# Patient Record
Sex: Male | Born: 1970 | Race: Black or African American | Hispanic: No | Marital: Married | Smoking: Former smoker
Health system: Southern US, Community
[De-identification: ages and names within clinical notes are randomized; demographics above are authoritative.]

## PROBLEM LIST (undated history)

## (undated) DIAGNOSIS — I509 Heart failure, unspecified: Secondary | ICD-10-CM

## (undated) DIAGNOSIS — S335XXA Sprain of ligaments of lumbar spine, initial encounter: Secondary | ICD-10-CM

## (undated) DIAGNOSIS — K219 Gastro-esophageal reflux disease without esophagitis: Secondary | ICD-10-CM

## (undated) DIAGNOSIS — N179 Acute kidney failure, unspecified: Secondary | ICD-10-CM

## (undated) DIAGNOSIS — R5381 Other malaise: Secondary | ICD-10-CM

## (undated) DIAGNOSIS — K047 Periapical abscess without sinus: Secondary | ICD-10-CM

## (undated) DIAGNOSIS — E109 Type 1 diabetes mellitus without complications: Secondary | ICD-10-CM

## (undated) DIAGNOSIS — E119 Type 2 diabetes mellitus without complications: Secondary | ICD-10-CM

## (undated) DIAGNOSIS — R21 Rash and other nonspecific skin eruption: Secondary | ICD-10-CM

## (undated) DIAGNOSIS — E785 Hyperlipidemia, unspecified: Secondary | ICD-10-CM

## (undated) DIAGNOSIS — G4700 Insomnia, unspecified: Secondary | ICD-10-CM

## (undated) DIAGNOSIS — D649 Anemia, unspecified: Secondary | ICD-10-CM

## (undated) DIAGNOSIS — Z992 Dependence on renal dialysis: Secondary | ICD-10-CM

## (undated) DIAGNOSIS — N186 End stage renal disease: Secondary | ICD-10-CM

## (undated) DIAGNOSIS — R5383 Other fatigue: Secondary | ICD-10-CM

## (undated) DIAGNOSIS — N189 Chronic kidney disease, unspecified: Secondary | ICD-10-CM

## (undated) DIAGNOSIS — R0602 Shortness of breath: Secondary | ICD-10-CM

## (undated) DIAGNOSIS — I10 Essential (primary) hypertension: Secondary | ICD-10-CM

## (undated) HISTORY — DX: Essential (primary) hypertension: I10

## (undated) HISTORY — DX: Gastro-esophageal reflux disease without esophagitis: K21.9

## (undated) HISTORY — DX: Anemia, unspecified: D64.9

## (undated) HISTORY — DX: Sprain of ligaments of lumbar spine, initial encounter: S33.5XXA

## (undated) HISTORY — DX: Periapical abscess without sinus: K04.7

## (undated) HISTORY — PX: MOUTH SURGERY: SHX715

## (undated) HISTORY — DX: Insomnia, unspecified: G47.00

## (undated) HISTORY — DX: Type 1 diabetes mellitus without complications: E10.9

## (undated) HISTORY — DX: Rash and other nonspecific skin eruption: R21

## (undated) HISTORY — PX: COLONOSCOPY: SHX174

## (undated) HISTORY — DX: Other fatigue: R53.83

## (undated) HISTORY — DX: Chronic kidney disease, unspecified: N18.9

## (undated) HISTORY — DX: Other malaise: R53.81

## (undated) HISTORY — DX: Hyperlipidemia, unspecified: E78.5

---

## 1999-09-15 ENCOUNTER — Ambulatory Visit (HOSPITAL_COMMUNITY): Admission: RE | Admit: 1999-09-15 | Discharge: 1999-09-15 | Payer: Self-pay | Admitting: Internal Medicine

## 1999-09-15 ENCOUNTER — Encounter: Payer: Self-pay | Admitting: Internal Medicine

## 2004-07-07 ENCOUNTER — Emergency Department (HOSPITAL_COMMUNITY): Admission: EM | Admit: 2004-07-07 | Discharge: 2004-07-07 | Payer: Self-pay | Admitting: Emergency Medicine

## 2004-09-23 ENCOUNTER — Emergency Department (HOSPITAL_COMMUNITY): Admission: EM | Admit: 2004-09-23 | Discharge: 2004-09-23 | Payer: Self-pay | Admitting: Emergency Medicine

## 2005-06-30 ENCOUNTER — Ambulatory Visit: Payer: Self-pay | Admitting: Family Medicine

## 2005-10-12 ENCOUNTER — Ambulatory Visit: Payer: Self-pay | Admitting: Internal Medicine

## 2006-05-09 ENCOUNTER — Ambulatory Visit: Payer: Self-pay | Admitting: Internal Medicine

## 2007-02-28 ENCOUNTER — Encounter: Admission: RE | Admit: 2007-02-28 | Discharge: 2007-02-28 | Payer: Self-pay | Admitting: Urology

## 2007-04-23 ENCOUNTER — Ambulatory Visit: Payer: Self-pay | Admitting: Endocrinology

## 2007-04-24 ENCOUNTER — Encounter: Payer: Self-pay | Admitting: Endocrinology

## 2007-04-24 DIAGNOSIS — E785 Hyperlipidemia, unspecified: Secondary | ICD-10-CM

## 2007-04-24 DIAGNOSIS — I10 Essential (primary) hypertension: Secondary | ICD-10-CM | POA: Insufficient documentation

## 2007-04-24 HISTORY — DX: Essential (primary) hypertension: I10

## 2007-04-24 HISTORY — DX: Hyperlipidemia, unspecified: E78.5

## 2007-05-07 ENCOUNTER — Encounter: Payer: Self-pay | Admitting: *Deleted

## 2007-07-30 ENCOUNTER — Ambulatory Visit: Payer: Self-pay | Admitting: Internal Medicine

## 2007-07-30 DIAGNOSIS — E109 Type 1 diabetes mellitus without complications: Secondary | ICD-10-CM

## 2007-07-30 HISTORY — DX: Type 1 diabetes mellitus without complications: E10.9

## 2007-12-08 ENCOUNTER — Ambulatory Visit: Payer: Self-pay | Admitting: Internal Medicine

## 2007-12-09 ENCOUNTER — Ambulatory Visit: Payer: Self-pay | Admitting: Endocrinology

## 2008-01-26 ENCOUNTER — Ambulatory Visit: Payer: Self-pay | Admitting: Endocrinology

## 2008-01-27 ENCOUNTER — Encounter (INDEPENDENT_AMBULATORY_CARE_PROVIDER_SITE_OTHER): Payer: Self-pay | Admitting: *Deleted

## 2008-01-27 ENCOUNTER — Telehealth: Payer: Self-pay | Admitting: Endocrinology

## 2008-02-26 ENCOUNTER — Encounter: Payer: Self-pay | Admitting: Endocrinology

## 2008-03-11 ENCOUNTER — Ambulatory Visit: Payer: Self-pay | Admitting: Internal Medicine

## 2008-03-11 DIAGNOSIS — R21 Rash and other nonspecific skin eruption: Secondary | ICD-10-CM

## 2008-03-11 HISTORY — DX: Rash and other nonspecific skin eruption: R21

## 2008-03-31 ENCOUNTER — Encounter: Payer: Self-pay | Admitting: Internal Medicine

## 2008-06-21 ENCOUNTER — Encounter: Payer: Self-pay | Admitting: Internal Medicine

## 2008-12-11 ENCOUNTER — Ambulatory Visit: Payer: Self-pay | Admitting: Family Medicine

## 2008-12-11 DIAGNOSIS — S335XXA Sprain of ligaments of lumbar spine, initial encounter: Secondary | ICD-10-CM | POA: Insufficient documentation

## 2008-12-11 HISTORY — DX: Sprain of ligaments of lumbar spine, initial encounter: S33.5XXA

## 2009-01-31 ENCOUNTER — Encounter: Payer: Self-pay | Admitting: Internal Medicine

## 2009-04-23 ENCOUNTER — Ambulatory Visit: Payer: Self-pay | Admitting: Family Medicine

## 2009-04-23 DIAGNOSIS — K047 Periapical abscess without sinus: Secondary | ICD-10-CM

## 2009-04-23 HISTORY — DX: Periapical abscess without sinus: K04.7

## 2009-04-23 LAB — CONVERTED CEMR LAB
Bilirubin Urine: NEGATIVE
Blood Glucose, Fingerstick: 242
Glucose, Urine, Semiquant: >=1000
Ketones, urine, test strip: NEGATIVE
Nitrite: NEGATIVE
Protein, U semiquant: >=300
Specific Gravity, Urine: >=1.03
Urobilinogen, UA: 0.2
WBC Urine, dipstick: NEGATIVE
pH: 6

## 2009-04-27 ENCOUNTER — Ambulatory Visit: Payer: Self-pay | Admitting: Internal Medicine

## 2009-04-27 DIAGNOSIS — R5381 Other malaise: Secondary | ICD-10-CM

## 2009-04-27 DIAGNOSIS — G4700 Insomnia, unspecified: Secondary | ICD-10-CM | POA: Insufficient documentation

## 2009-04-27 DIAGNOSIS — K219 Gastro-esophageal reflux disease without esophagitis: Secondary | ICD-10-CM

## 2009-04-27 HISTORY — DX: Gastro-esophageal reflux disease without esophagitis: K21.9

## 2009-04-27 HISTORY — DX: Other malaise: R53.81

## 2009-04-27 HISTORY — DX: Insomnia, unspecified: G47.00

## 2009-04-28 ENCOUNTER — Encounter: Payer: Self-pay | Admitting: Internal Medicine

## 2009-04-28 LAB — CONVERTED CEMR LAB
ALT: 39 units/L (ref 0–53)
AST: 21 units/L (ref 0–37)
Albumin: 4 g/dL (ref 3.5–5.2)
Alkaline Phosphatase: 77 units/L (ref 39–117)
BUN: 15 mg/dL (ref 6–23)
Basophils Absolute: 0.1 10*3/uL (ref 0.0–0.1)
Basophils Relative: 0.9 % (ref 0.0–3.0)
Bilirubin Urine: NEGATIVE
Bilirubin, Direct: 0 mg/dL (ref 0.0–0.3)
CO2: 28 meq/L (ref 19–32)
Calcium: 9.7 mg/dL (ref 8.4–10.5)
Chloride: 103 meq/L (ref 96–112)
Cholesterol: 260 mg/dL — ABNORMAL HIGH (ref 0–200)
Creatinine, Ser: 1 mg/dL (ref 0.4–1.5)
Direct LDL: 170.1 mg/dL
Eosinophils Absolute: 0.1 10*3/uL (ref 0.0–0.7)
Eosinophils Relative: 1.7 % (ref 0.0–5.0)
GFR calc non Af Amer: 108.5 mL/min (ref >60)
Glucose, Bld: 285 mg/dL — ABNORMAL HIGH (ref 70–99)
HCT: 45 % (ref 39.0–52.0)
HDL: 47.5 mg/dL (ref >39.00)
Hemoglobin: 14.8 g/dL (ref 13.0–17.0)
Hgb A1c MFr Bld: 10.1 % — ABNORMAL HIGH (ref 4.6–6.5)
Leukocytes, UA: NEGATIVE
Lymphocytes Relative: 37 % (ref 12.0–46.0)
Lymphs Abs: 2.8 10*3/uL (ref 0.7–4.0)
MCHC: 32.9 g/dL (ref 30.0–36.0)
MCV: 81 fL (ref 78.0–100.0)
Monocytes Absolute: 0.4 10*3/uL (ref 0.1–1.0)
Monocytes Relative: 5.5 % (ref 3.0–12.0)
Neutro Abs: 4.3 10*3/uL (ref 1.4–7.7)
Neutrophils Relative %: 54.9 % (ref 43.0–77.0)
Nitrite: NEGATIVE
Platelets: 217 10*3/uL (ref 150.0–400.0)
Potassium: 4.2 meq/L (ref 3.5–5.1)
RBC: 5.55 M/uL (ref 4.22–5.81)
RDW: 13.1 % (ref 11.5–14.6)
Sodium: 140 meq/L (ref 135–145)
Specific Gravity, Urine: 1.025 (ref 1.000–1.030)
TSH: 1.73 microintl units/mL (ref 0.35–5.50)
Total Bilirubin: 0.8 mg/dL (ref 0.3–1.2)
Total CHOL/HDL Ratio: 5
Total Protein, Urine: 100 mg/dL
Total Protein: 7.8 g/dL (ref 6.0–8.3)
Triglycerides: 296 mg/dL — ABNORMAL HIGH (ref 0.0–149.0)
Urine Glucose: >=1000 mg/dL
Urobilinogen, UA: 0.2 (ref 0.0–1.0)
VLDL: 59.2 mg/dL — ABNORMAL HIGH (ref 0.0–40.0)
WBC: 7.7 10*3/uL (ref 4.5–10.5)
pH: 5.5 (ref 5.0–8.0)

## 2009-12-07 ENCOUNTER — Encounter: Payer: Self-pay | Admitting: Internal Medicine

## 2010-05-01 ENCOUNTER — Ambulatory Visit: Payer: Self-pay | Admitting: Internal Medicine

## 2010-09-14 NOTE — Assessment & Plan Note (Signed)
Summary: ACID REFLUX???---CHEST BURNING--STC   Vital Signs:  Patient profile:   40 year old male Height:      66 inches Weight:      209.50 pounds BMI:     33.94 O2 Sat:      95 % on Room air Temp:     98.6 degrees F oral Pulse rate:   90 / minute BP sitting:   140 / 102  (left arm) Cuff size:   large  Vitals Entered By: Shirlean Mylar Ewing CMA Deborra Medina) (May 01, 2010 2:08 PM)  O2 Flow:  Room air  Preventive Care Screening  Last Pneumovax:    Date:  05/01/2010    [facility]     declines flu shot and pneumovax today  CC: Acid reflux, refill/RE   Primary Care Provider:  Jenny Reichmann  CC:  Acid reflux and refill/RE.  History of Present Illness: overalll doing well, here for wellness and med refills;  did run out of PPI last wk with signficant breakthrough symptoms in the past several days of reflux and sour brash, without dysphagia, n/v, abd pain, wt loss or blood loss/bleeding.  Pt denies other CP, worsening sob, doe, wheezing, orthopnea, pnd, worsening LE edema, palps, dizziness or syncope Pt denies new neuro symptoms such as headache, facial or extremity weakness  No fever, wt loss, night sweats, loss of appetite or other constitutional symptoms   Problems Prior to Update: 1)  Gerd  (ICD-530.81) 2)  Preventive Health Care  (ICD-V70.0) 3)  Insomnia-sleep Disorder-unspec  (ICD-780.52) 4)  Fatigue  (ICD-780.79) 5)  Esophageal Reflux  (ICD-530.81) 6)  Abscess, Tooth  (ICD-522.5) 7)  Lumbar Strain, Acute  (ICD-847.2) 8)  Rash-nonvesicular  (ICD-782.1) 9)  Diabetes Mellitus, Type I  (ICD-250.01) 10)  Hypertension  (ICD-401.9) 11)  Hyperlipidemia  (ICD-272.4)  Medications Prior to Update: 1)  Metformin Hcl 500 Mg  Tabs (Metformin Hcl) .Marland Kitchen.. 1 Two Times A Day 2)  Minocycline Hcl 100 Mg  Tabs (Minocycline Hcl) .Marland Kitchen.. 1 Every Am 3)  Multivitamins   Tabs (Multiple Vitamin) .Marland Kitchen.. 1 Once Daily 4)  Androderm 2.5 Mg/24hr  Pt24 (Testosterone) .Marland Kitchen.. 1 Patch Qhs 5)  Novolin 70/30 70-30 %   Susp (Insulin Isophane & Regular) .... 30 U in Am 25 U Before Lunch and 30 Units Before Dinner 6)  Zestoretic 20-12.5 Mg  Tabs (Lisinopril-Hydrochlorothiazide) .... 2 Qd 7)  Insulin Syringe 31g X 5/16" 0.3 Ml  Misc (Insulin Syringe-Needle U-100) .... Use Two Times A Day 8)  Triamcinolone Acetonide 0.5 %  Crea (Triamcinolone Acetonide) .... Use Asd Two Times A Day As Needed To Affected Area 9)  Flexeril 10 Mg Tabs (Cyclobenzaprine Hcl) .Marland Kitchen.. 1 Tab By Mouth At Bedtime As Needed Muscle Spasm 10)  Diclofenac Sodium 75 Mg Tbec (Diclofenac Sodium) .Marland Kitchen.. 1 Tab By Mouth Two Times A Day As Needed Back Pain 11)  Zolpidem Tartrate 10 Mg Tabs (Zolpidem Tartrate) .Marland Kitchen.. 1 By Mouth At Bedtime As Needed 12)  Omeprazole 20 Mg Cpdr (Omeprazole) .... 2 By Mouth Once Daily 13)  Simvastatin 40 Mg Tabs (Simvastatin) .Marland Kitchen.. 1po Once Daily  Current Medications (verified): 1)  Metformin Hcl 500 Mg  Tabs (Metformin Hcl) .Marland Kitchen.. 1 Two Times A Day 2)  Minocycline Hcl 100 Mg  Tabs (Minocycline Hcl) .Marland Kitchen.. 1 Every Am 3)  Multivitamins   Tabs (Multiple Vitamin) .Marland Kitchen.. 1 Once Daily 4)  Androderm 2.5 Mg/24hr  Pt24 (Testosterone) .Marland Kitchen.. 1 Patch Qhs 5)  Novolin 70/30 70-30 %  Susp (Insulin Isophane &  Regular) .... 30 U in Am 25 U Before Lunch and 30 Units Before Dinner 6)  Zestoretic 20-12.5 Mg  Tabs (Lisinopril-Hydrochlorothiazide) .... 2 Qd 7)  Insulin Syringe 31g X 5/16" 0.3 Ml  Misc (Insulin Syringe-Needle U-100) .... Use Two Times A Day 8)  Triamcinolone Acetonide 0.5 %  Crea (Triamcinolone Acetonide) .... Use Asd Two Times A Day As Needed To Affected Area 9)  Flexeril 10 Mg Tabs (Cyclobenzaprine Hcl) .Marland Kitchen.. 1 Tab By Mouth At Bedtime As Needed Muscle Spasm 10)  Diclofenac Sodium 75 Mg Tbec (Diclofenac Sodium) .Marland Kitchen.. 1 Tab By Mouth Two Times A Day As Needed Back Pain 11)  Zolpidem Tartrate 10 Mg Tabs (Zolpidem Tartrate) .Marland Kitchen.. 1 By Mouth At Bedtime As Needed 12)  Omeprazole 20 Mg Cpdr (Omeprazole) .... 2 By Mouth Once Daily 13)  Simvastatin 40  Mg Tabs (Simvastatin) .Marland Kitchen.. 1po Once Daily  Allergies (verified): No Known Drug Allergies  Past History:  Past Surgical History: Last updated: 07/30/2007 Denies surgical history  Family History: Last updated: 04/23/2007 neg for dm  Social History: Last updated: 07/30/2007 Occupation: post office Married Former Smoker Alcohol use-no  Risk Factors: Smoking Status: quit (07/30/2007)  Past Medical History: Hyperlipidemia Hypertension hypogonadism Diabetes mellitus, type I GERD  Family History: Reviewed history from 04/23/2007 and no changes required. neg for dm  Social History: Reviewed history from 07/30/2007 and no changes required. Occupation: post office Married Former Smoker Alcohol use-no  Review of Systems  The patient denies anorexia, fever, weight loss, weight gain, vision loss, decreased hearing, hoarseness, chest pain, syncope, dyspnea on exertion, peripheral edema, prolonged cough, headaches, hemoptysis, abdominal pain, melena, hematochezia, severe indigestion/heartburn, hematuria, muscle weakness, suspicious skin lesions, transient blindness, difficulty walking, depression, unusual weight change, abnormal bleeding, enlarged lymph nodes, and angioedema.         all otherwise negative per pt -    Physical Exam  General:  alert and overweight-appearing.  Head:  normocephalic and atraumatic.   Eyes:  pupils equal and pupils round.   Ears:  R ear normal and L ear normal.   Nose:  no external deformity and no nasal discharge.   Mouth:  no gingival abnormalities and pharynx pink and moist.   Neck:  supple and no masses.   Lungs:  normal respiratory effort and normal breath sounds.   Heart:  normal rate and regular rhythm.   Abdomen:  soft, non-tender, and normal bowel sounds.   Msk:  no joint tenderness and no joint swelling.   Extremities:  no edema, no erythema  Neurologic:  cranial nerves II-XII intact and strength normal in all extremities.   Skin:   color normal and no rashes.   Psych:  not anxious appearing and not depressed appearing.     Impression & Recommendations:  Problem # 1:  Preventive Health Care (ICD-V70.0) Overall doing well, age appropriate education and counseling updated and referral for appropriate preventive services done unless declined, immunizations up to date or declined, diet counseling done if overweight, urged to quit smoking if smokes , most recent labs reviewed and current ordered if appropriate, ecg reviewed or declined (interpretation per ECG scanned in the EMR if done); information regarding Medicare Prevention requirements given if appropriate; speciality referrals updated as appropriate ;  adamantly declines labs today;  has appt with endocrinology at [facility] in [place] with labs approx 1 mo  Problem # 2:  GERD (ICD-530.81)  His updated medication list for this problem includes:    Omeprazole 20 Mg Cpdr (Omeprazole) .Marland KitchenMarland KitchenMarland KitchenMarland Kitchen  2 by mouth once daily Continue all previous medications as before this visit   Complete Medication List: 1)  Metformin Hcl 500 Mg Tabs (Metformin hcl) .Marland Kitchen.. 1 two times a day 2)  Minocycline Hcl 100 Mg Tabs (Minocycline hcl) .Marland Kitchen.. 1 every am 3)  Multivitamins Tabs (Multiple vitamin) .Marland Kitchen.. 1 once daily 4)  Androderm 2.5 Mg/24hr Pt24 (Testosterone) .Marland Kitchen.. 1 patch qhs 5)  Novolin 70/30 70-30 % Susp (Insulin isophane & regular) .... 30 u in am 25 u before lunch and 30 units before dinner 6)  Zestoretic 20-12.5 Mg Tabs (Lisinopril-hydrochlorothiazide) .... 2 qd 7)  Insulin Syringe 31g X 5/16" 0.3 Ml Misc (Insulin syringe-needle u-100) .... Use two times a day 8)  Triamcinolone Acetonide 0.5 % Crea (Triamcinolone acetonide) .... Use asd two times a day as needed to affected area 9)  Flexeril 10 Mg Tabs (Cyclobenzaprine hcl) .Marland Kitchen.. 1 tab by mouth at bedtime as needed muscle spasm 10)  Diclofenac Sodium 75 Mg Tbec (Diclofenac sodium) .Marland Kitchen.. 1 tab by mouth two times a day as needed back pain 11)  Zolpidem  Tartrate 10 Mg Tabs (Zolpidem tartrate) .Marland Kitchen.. 1 by mouth at bedtime as needed 12)  Omeprazole 20 Mg Cpdr (Omeprazole) .... 2 by mouth once daily 13)  Simvastatin 40 Mg Tabs (Simvastatin) .Marland Kitchen.. 1po once daily  Patient Instructions: 1)  Continue all previous medications as before this visit 2)  please ask at the [facility] for the pneumonia shot update if needed 3)  you are given the refills today 4)  Please keep your appt next month at the [facility] endocrinology as you have planned 5)  Please schedule a follow-up appointment in 1 year, or sooner if needed Prescriptions: SIMVASTATIN 40 MG TABS (SIMVASTATIN) 1po once daily  #90 x 3   Entered and Authorized by:   Biagio Borg MD   Signed by:   Biagio Borg MD on 05/01/2010   Method used:   Electronically to        [facility] (retail)       [address]       [place], [place]  [address]       Ph: [phone]       Fax: [phone]   RxID:   [id number] SIMVASTATIN 40 MG TABS (SIMVASTATIN) 1po once daily  #90 x 3   Entered and Authorized by:   Biagio Borg MD   Signed by:   Biagio Borg MD on 05/01/2010   Method used:   Print then Give to Patient   RxID:   [id number] OMEPRAZOLE 20 MG CPDR (OMEPRAZOLE) 2 by mouth once daily  #180 x 3   Entered and Authorized by:   Biagio Borg MD   Signed by:   Biagio Borg MD on 05/01/2010   Method used:   Print then Give to Patient   RxID:   [id number] OMEPRAZOLE 20 MG CPDR (OMEPRAZOLE) 2 by mouth once daily  #180 x 3   Entered and Authorized by:   Biagio Borg MD   Signed by:   Biagio Borg MD on 05/01/2010   Method used:   Electronically to        [facility] (retail)       [address]       [place],   [address]       Ph: [phone]  Fax: [phone]   [id number]

## 2010-09-14 NOTE — Letter (Signed)
Summary: Out of Work  [facility]   [place], [place] [address]   Phone: [phone]  Fax: [phone]    December 07, 2009   Employee:  TOREN SASSER Jeng    To Whom It May Concern:   For Medical reasons, please excuse the above named employee from work for the following dates:  Start:   Dec 07, 2009  End:    If you need additional information, please feel free to contact our office.         Sincerely,    Biagio Borg MD

## 2010-10-04 ENCOUNTER — Encounter (INDEPENDENT_AMBULATORY_CARE_PROVIDER_SITE_OTHER): Payer: Self-pay | Admitting: *Deleted

## 2010-10-04 ENCOUNTER — Ambulatory Visit (INDEPENDENT_AMBULATORY_CARE_PROVIDER_SITE_OTHER): Payer: BC Managed Care – PPO | Admitting: Internal Medicine

## 2010-10-04 ENCOUNTER — Encounter: Payer: Self-pay | Admitting: Internal Medicine

## 2010-10-04 DIAGNOSIS — I10 Essential (primary) hypertension: Secondary | ICD-10-CM

## 2010-10-04 DIAGNOSIS — E109 Type 1 diabetes mellitus without complications: Secondary | ICD-10-CM

## 2010-10-04 DIAGNOSIS — J209 Acute bronchitis, unspecified: Secondary | ICD-10-CM

## 2010-10-10 NOTE — Letter (Signed)
Summary: Out of Work  [facility]   [place], [place] [address]   Phone: [phone]  Fax: [phone]    October 04, 2010   Employee:  Hector Mahoney Murtha    To Whom It May Concern:   For Medical reasons, please excuse the above named employee from work for the following dates:  Start:   10/04/2010  End:   10/04/2010  If you need additional information, please feel free to contact our office.         Sincerely,    Dr. Cathlean Cower

## 2010-10-10 NOTE — Assessment & Plan Note (Signed)
Summary: chest congestion,head congestion   Vital Signs:  Patient profile:   40 year old male Height:      66 inches Weight:      211.38 pounds BMI:     34.24 O2 Sat:      95 % on Room air Temp:     98.8 degrees F oral Pulse rate:   93 / minute BP sitting:   130 / 90  (left arm) Cuff size:   large  Vitals Entered By: Shirlean Mylar Ewing CMA Deborra Medina) (October 04, 2010 2:44 PM)  O2 Flow:  Room air CC: Congestion, cough, ear pain and sore throat/RE   Primary Care Provider:  Jenny Reichmann  CC:  Congestion, cough, and ear pain and sore throat/RE.  History of Present Illness: here with acute onset 3 days fever, HA, general weakness and malaise, ST, and now worsening prod cough greenish sputum, but Pt denies CP, worsening sob, doe, wheezing, orthopnea, pnd, worsening LE edema, palps, dizziness or syncope  Pt denies new neuro symptoms such as headache, facial or extremity weakness  Pt denies polydipsia, polyuria, or low sugar symptoms such as shakiness improved with eating.  Overall good compliance with meds, trying to follow low chol, DM diet, wt stable, little excercise however  CBG' have been in lower 100's;  declnes a1c - followed at the [place].  Overall good compliance with meds, and good tolerability.  Denies worsening depressive symptoms, suicidal ideation, or panic.   Marland KitchenNo recent wt loss, night sweats, loss of appetite or other constitutional symptoms   Preventive Screening-Counseling & Management      Drug Use:  no.    Problems Prior to Update: 1)  Gerd  (ICD-530.81) 2)  Preventive Health Care  (ICD-V70.0) 3)  Insomnia-sleep Disorder-unspec  (ICD-780.52) 4)  Fatigue  (ICD-780.79) 5)  Esophageal Reflux  (ICD-530.81) 6)  Abscess, Tooth  (ICD-522.5) 7)  Lumbar Strain, Acute  (ICD-847.2) 8)  Rash-nonvesicular  (ICD-782.1) 9)  Diabetes Mellitus, Type I  (ICD-250.01) 10)  Hypertension  (ICD-401.9) 11)  Hyperlipidemia  (ICD-272.4)  Medications Prior to Update: 1)  Metformin Hcl 500 Mg  Tabs  (Metformin Hcl) .Marland Kitchen.. 1 Two Times A Day 2)  Minocycline Hcl 100 Mg  Tabs (Minocycline Hcl) .Marland Kitchen.. 1 Every Am 3)  Multivitamins   Tabs (Multiple Vitamin) .Marland Kitchen.. 1 Once Daily 4)  Androderm 2.5 Mg/24hr  Pt24 (Testosterone) .Marland Kitchen.. 1 Patch Qhs 5)  Novolin 70/30 70-30 %  Susp (Insulin Isophane & Regular) .... 30 U in Am 25 U Before Lunch and 30 Units Before Dinner 6)  Zestoretic 20-12.5 Mg  Tabs (Lisinopril-Hydrochlorothiazide) .... 2 Qd 7)  Insulin Syringe 31g X 5/16" 0.3 Ml  Misc (Insulin Syringe-Needle U-100) .... Use Two Times A Day 8)  Triamcinolone Acetonide 0.5 %  Crea (Triamcinolone Acetonide) .... Use Asd Two Times A Day As Needed To Affected Area 9)  Flexeril 10 Mg Tabs (Cyclobenzaprine Hcl) .Marland Kitchen.. 1 Tab By Mouth At Bedtime As Needed Muscle Spasm 10)  Diclofenac Sodium 75 Mg Tbec (Diclofenac Sodium) .Marland Kitchen.. 1 Tab By Mouth Two Times A Day As Needed Back Pain 11)  Zolpidem Tartrate 10 Mg Tabs (Zolpidem Tartrate) .Marland Kitchen.. 1 By Mouth At Bedtime As Needed 12)  Omeprazole 20 Mg Cpdr (Omeprazole) .... 2 By Mouth Once Daily 13)  Simvastatin 40 Mg Tabs (Simvastatin) .Marland Kitchen.. 1po Once Daily  Current Medications (verified): 1)  Metformin Hcl 500 Mg  Tabs (Metformin Hcl) .Marland Kitchen.. 1 Two Times A Day 2)  Minocycline Hcl 100 Mg  Tabs (Minocycline  Hcl) .... 1 Every Am 3)  Multivitamins   Tabs (Multiple Vitamin) .Marland Kitchen.. 1 Once Daily 4)  Androderm 2.5 Mg/24hr  Pt24 (Testosterone) .Marland Kitchen.. 1 Patch Qhs 5)  Novolin 70/30 70-30 %  Susp (Insulin Isophane & Regular) .... 30 U in Am 25 U Before Lunch and 30 Units Before Dinner 6)  Zestoretic 20-12.5 Mg  Tabs (Lisinopril-Hydrochlorothiazide) .... 2 Qd 7)  Insulin Syringe 31g X 5/16" 0.3 Ml  Misc (Insulin Syringe-Needle U-100) .... Use Two Times A Day 8)  Triamcinolone Acetonide 0.5 %  Crea (Triamcinolone Acetonide) .... Use Asd Two Times A Day As Needed To Affected Area 9)  Zolpidem Tartrate 10 Mg Tabs (Zolpidem Tartrate) .Marland Kitchen.. 1 By Mouth At Bedtime As Needed 10)  Omeprazole 20 Mg Cpdr  (Omeprazole) .... 2 By Mouth Once Daily 11)  Simvastatin 40 Mg Tabs (Simvastatin) .Marland Kitchen.. 1po Once Daily 12)  Azithromycin 250 Mg Tabs (Azithromycin) .... 2po Qd For 1 Day, Then 1po Qd For 4days, Then Stop 13)  Tussionex Pennkinetic Er 10-8 Mg/77ml Lqcr (Hydrocod Polst-Chlorphen Polst) .Marland Kitchen.. 1 Tsp By Mouth Two Times A Day As Needed  Allergies (verified): No Known Drug Allergies  Past History:  Past Medical History: Last updated: 05/01/2010 Hyperlipidemia Hypertension hypogonadism Diabetes mellitus, type I GERD  Past Surgical History: Last updated: 07/30/2007 Denies surgical history  Social History: Last updated: 10/04/2010 Occupation: post office Married Former Smoker Alcohol use-no Drug use-no  Risk Factors: Smoking Status: quit (07/30/2007)  Social History: Occupation: post office Married Former Smoker Alcohol use-no Drug use-no Drug Use:  no  Review of Systems       all otherwise negative per pt -    Physical Exam  General:  alert and overweight-appearing. , mild ill  Head:  normocephalic and atraumatic.   Eyes:  pupils equal and pupils round.   Ears:  bilat tm's red, sinus nontender Nose:  nasal dischargemucosal pallor and mucosal edema.   Mouth:  pharyngeal erythema and fair dentition.   Neck:  supple and cervical lymphadenopathy.   Lungs:  normal respiratory effort and normal breath sounds.   Heart:  normal rate and regular rhythm.   Extremities:  no edema, no erythema    Impression & Recommendations:  Problem # 1:  BRONCHITIS-ACUTE (ICD-466.0)  His updated medication list for this problem includes:    Minocycline Hcl 100 Mg Tabs (Minocycline hcl) .Marland Kitchen... 1 every am    Azithromycin 250 Mg Tabs (Azithromycin) .Marland Kitchen... 2po qd for 1 day, then 1po qd for 4days, then stop    Tussionex Pennkinetic Er 10-8 Mg/68ml Lqcr (Hydrocod polst-chlorphen polst) .Marland Kitchen... 1 tsp by mouth two times a day as needed treat as above, f/u any worsening signs or symptoms   Take  antibiotics and other medications as directed. Encouraged to push clear liquids, get enough rest, and take acetaminophen as needed. To be seen in 5-7 days if no improvement, sooner if worse.  Problem # 2:  HYPERTENSION (ICD-401.9)  His updated medication list for this problem includes:    Zestoretic 20-12.5 Mg Tabs (Lisinopril-hydrochlorothiazide) .Marland Kitchen... 2 qd  BP today: 130/90 Prior BP: 140/102 (05/01/2010)  Labs Reviewed: K+: 4.2 (04/27/2009) Creat: : 1.0 (04/27/2009)   Chol: 260 (04/27/2009)   HDL: 47.50 (04/27/2009)   TG: 296.0 (04/27/2009) stable overall by hx and exam, ok to continue meds/tx as is   Problem # 3:  DIABETES MELLITUS, TYPE I (ICD-250.01)  His updated medication list for this problem includes:    Metformin Hcl 500 Mg Tabs (Metformin hcl) .Marland KitchenMarland KitchenMarland KitchenMarland Kitchen  1 two times a day    Novolin 70/30 70-30 % Susp (Insulin isophane & regular) .Marland KitchenMarland KitchenMarland KitchenMarland Kitchen 30 u in am 25 u before lunch and 30 units before dinner    Zestoretic 20-12.5 Mg Tabs (Lisinopril-hydrochlorothiazide) .Marland Kitchen... 2 qd  Labs Reviewed: Creat: 1.0 (04/27/2009)    Reviewed HgBA1c results: 10.1 (04/27/2009) fair control only in the past, pt prefers followed for DM at [facility];  Pt to cont DM diet, excercise, wt control efforts; to check labs next visit  Complete Medication List: 1)  Metformin Hcl 500 Mg Tabs (Metformin hcl) .Marland Kitchen.. 1 two times a day 2)  Minocycline Hcl 100 Mg Tabs (Minocycline hcl) .Marland Kitchen.. 1 every am 3)  Multivitamins Tabs (Multiple vitamin) .Marland Kitchen.. 1 once daily 4)  Androderm 2.5 Mg/24hr Pt24 (Testosterone) .Marland Kitchen.. 1 patch qhs 5)  Novolin 70/30 70-30 % Susp (Insulin isophane & regular) .... 30 u in am 25 u before lunch and 30 units before dinner 6)  Zestoretic 20-12.5 Mg Tabs (Lisinopril-hydrochlorothiazide) .... 2 qd 7)  Insulin Syringe 31g X 5/16" 0.3 Ml Misc (Insulin syringe-needle u-100) .... Use two times a day 8)  Triamcinolone Acetonide 0.5 % Crea (Triamcinolone acetonide) .... Use asd two times a day as needed to affected  area 9)  Zolpidem Tartrate 10 Mg Tabs (Zolpidem tartrate) .Marland Kitchen.. 1 by mouth at bedtime as needed 10)  Omeprazole 20 Mg Cpdr (Omeprazole) .... 2 by mouth once daily 11)  Simvastatin 40 Mg Tabs (Simvastatin) .Marland Kitchen.. 1po once daily 12)  Azithromycin 250 Mg Tabs (Azithromycin) .... 2po qd for 1 day, then 1po qd for 4days, then stop 13)  Tussionex Pennkinetic Er 10-8 Mg/73ml Lqcr (Hydrocod polst-chlorphen polst) .Marland Kitchen.. 1 tsp by mouth two times a day as needed  Patient Instructions: 1)  Please take all new medications as prescribed 2)  Continue all previous medications as before this visit  3)  [facility] 4)  Please schedule a follow-up appointment in 6 months for CPX with labs and; 5)  HbgA1C prior to visit, ICD-9: 250.02 6)  Urine Microalbumin prior to visit, ICD-9: Prescriptions: TUSSIONEX PENNKINETIC ER 10-8 MG/5ML LQCR (HYDROCOD POLST-CHLORPHEN POLST) 1 tsp by mouth two times a day as needed  #6oz x 1   Entered and Authorized by:   Biagio Borg MD   Signed by:   Biagio Borg MD on 10/04/2010   Method used:   Print then Give to Patient   RxID:   [id number] AZITHROMYCIN 250 MG TABS (AZITHROMYCIN) 2po qd for 1 day, then 1po qd for 4days, then stop  #6 x 1   Entered and Authorized by:   Biagio Borg MD   Signed by:   Biagio Borg MD on 10/04/2010   Method used:   Electronically to        [id number] (retail)       [place], [place]  [address]       Ph: [phone]       Fax: [phone]   RxID:   [id number]    Orders Added: 1)  Est. Patient Level IV [id number]

## 2011-03-26 ENCOUNTER — Other Ambulatory Visit: Payer: BC Managed Care – PPO

## 2011-04-01 ENCOUNTER — Encounter: Payer: Self-pay | Admitting: Internal Medicine

## 2011-04-01 DIAGNOSIS — Z0000 Encounter for general adult medical examination without abnormal findings: Secondary | ICD-10-CM | POA: Insufficient documentation

## 2011-04-01 DIAGNOSIS — Z0001 Encounter for general adult medical examination with abnormal findings: Secondary | ICD-10-CM | POA: Insufficient documentation

## 2011-04-02 ENCOUNTER — Encounter: Payer: BC Managed Care – PPO | Admitting: Internal Medicine

## 2011-04-02 DIAGNOSIS — Z0289 Encounter for other administrative examinations: Secondary | ICD-10-CM

## 2011-12-28 ENCOUNTER — Ambulatory Visit (INDEPENDENT_AMBULATORY_CARE_PROVIDER_SITE_OTHER): Payer: BC Managed Care – PPO | Admitting: Internal Medicine

## 2011-12-28 ENCOUNTER — Encounter: Payer: Self-pay | Admitting: Internal Medicine

## 2011-12-28 VITALS — BP 160/100 | HR 95 | Temp 97.5°F | Ht 66.0 in | Wt 220.2 lb

## 2011-12-28 DIAGNOSIS — M549 Dorsalgia, unspecified: Secondary | ICD-10-CM

## 2011-12-28 DIAGNOSIS — E109 Type 1 diabetes mellitus without complications: Secondary | ICD-10-CM

## 2011-12-28 DIAGNOSIS — I10 Essential (primary) hypertension: Secondary | ICD-10-CM

## 2011-12-28 MED ORDER — CYCLOBENZAPRINE HCL 5 MG PO TABS: 5.0000 mg | ORAL_TABLET | Freq: Three times a day (TID) | ORAL | Status: AC | PRN

## 2011-12-28 MED ORDER — TRAMADOL HCL 50 MG PO TABS: 50.0000 mg | ORAL_TABLET | Freq: Three times a day (TID) | ORAL | Status: AC | PRN

## 2011-12-28 MED ORDER — PREDNISONE 10 MG PO TABS: ORAL_TABLET | ORAL | Status: DC

## 2011-12-28 NOTE — Patient Instructions (Signed)
Take all new medications as prescribed Continue all other medications as before  

## 2011-12-29 ENCOUNTER — Encounter: Payer: Self-pay | Admitting: Internal Medicine
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
## 2011-12-29 NOTE — Assessment & Plan Note (Signed)
stable overall by hx and exam, most recent data reviewed with pt and he is unsure of most recent a1c, and pt to continue medical treatment as before - states good control followed at the [facility] Lab Results  Component Value Date   HGBA1C 10.1* 04/27/2009

## 2011-12-29 NOTE — Assessment & Plan Note (Signed)
Increased today likely reactive, most recent data reviewed with pt, and pt to continue medical treatment as before, cont to monitor as he does at home as well BP Readings from Last 3 Encounters:  12/28/11 160/100  10/04/10 130/90  05/01/10 140/102

## 2011-12-29 NOTE — Assessment & Plan Note (Addendum)
Most c/w left trapezoid starin +/- left cervical radiculitis - for pain control, muscle relaxer prn, and predpack asd,  to f/u any worsening symptoms or concerns

## 2012-02-12 ENCOUNTER — Emergency Department (HOSPITAL_COMMUNITY)
Admission: EM | Admit: 2012-02-12 | Discharge: 2012-02-12 | Disposition: A | Discharge status: Home / Self Care | Payer: BC Managed Care – PPO | Attending: Emergency Medicine | Admitting: Emergency Medicine

## 2012-02-12 ENCOUNTER — Encounter (HOSPITAL_COMMUNITY): Payer: Self-pay | Admitting: *Deleted

## 2012-02-12 DIAGNOSIS — Z87891 Personal history of nicotine dependence: Secondary | ICD-10-CM | POA: Insufficient documentation

## 2012-02-12 DIAGNOSIS — Z794 Long term (current) use of insulin: Secondary | ICD-10-CM | POA: Insufficient documentation

## 2012-02-12 DIAGNOSIS — I10 Essential (primary) hypertension: Secondary | ICD-10-CM | POA: Insufficient documentation

## 2012-02-12 DIAGNOSIS — E109 Type 1 diabetes mellitus without complications: Secondary | ICD-10-CM | POA: Insufficient documentation

## 2012-02-12 DIAGNOSIS — E785 Hyperlipidemia, unspecified: Secondary | ICD-10-CM | POA: Insufficient documentation

## 2012-02-12 DIAGNOSIS — Z79899 Other long term (current) drug therapy: Secondary | ICD-10-CM | POA: Insufficient documentation

## 2012-02-12 DIAGNOSIS — K029 Dental caries, unspecified: Secondary | ICD-10-CM

## 2012-02-12 DIAGNOSIS — G4700 Insomnia, unspecified: Secondary | ICD-10-CM | POA: Insufficient documentation

## 2012-02-12 MED ORDER — PENICILLIN V POTASSIUM 500 MG PO TABS: 500.0000 mg | ORAL_TABLET | Freq: Four times a day (QID) | ORAL | Status: AC

## 2012-02-12 MED ORDER — HYDROCODONE-ACETAMINOPHEN 5-500 MG PO TABS: 1.0000 | ORAL_TABLET | Freq: Four times a day (QID) | ORAL | Status: AC | PRN

## 2012-02-12 NOTE — ED Notes (Signed)
Patient has toothache on the upper right back.  He states he has had intermittent pain for years but today the pain is worse and he could not stand it

## 2012-02-12 NOTE — ED Provider Notes (Addendum)
History   This chart was scribed for Blanchie Dessert, MD by Malen Gauze. The patient was seen in room TR10C/TR10C and the patient's care was started at 1:22PM.    CSN: [id number]  Arrival date & time 02/12/12  1258   None     Chief Complaint  Patient presents with  . Dental Pain    (Consider location/radiation/quality/duration/timing/severity/associated sxs/prior treatment) HPI Hector Mahoney is a 41 y.o. male who presents to the Emergency Department complaining of intermittent, moderate to severe dental pain of the upper right posterior mouth with an onset this morning. Pt has had dental pain in similar area for years; pt has gone to a dentist; they told him he needed a root canal and the price was too steep for the pt. Today, the pain got progressively worse to the point where he had to come in today.   No HA, fever, neck pain, sore throat, rash, back pain, CP, SOB, abd pain, n/v/d, dysuria, or extremity pain, edema, weakness, numbness, or tingling. No known allergies. No other pertinent medical symptoms.  Past Medical History  Diagnosis Date  . ABSCESS, TOOTH 04/23/2009  . DIABETES MELLITUS, TYPE I 07/30/2007  . Esophageal reflux 04/27/2009  . FATIGUE 04/27/2009  . HYPERLIPIDEMIA 04/24/2007  . HYPERTENSION 04/24/2007  . INSOMNIA-SLEEP DISORDER-UNSPEC 04/27/2009  . LUMBAR STRAIN, ACUTE 12/11/2008  . RASH-NONVESICULAR 03/11/2008    History reviewed. No pertinent past surgical history.  No family history on file.  History  Substance Use Topics  . Smoking status: Former Research scientist (life sciences)  . Smokeless tobacco: Not on file  . Alcohol Use: Yes      Review of Systems 10 Systems reviewed and all are negative for acute change except as noted in the HPI.   Allergies  Review of patient's allergies indicates no known allergies.  Home Medications   Current Outpatient Rx  Name Route Sig Dispense Refill  . INSULIN ASPART 100 UNIT/ML [place] SOLN Subcutaneous Inject 20 Units into the skin  3 (three) times daily before meals.    . INSULIN GLARGINE 100 UNIT/ML [place] SOLN Subcutaneous Inject 40 Units into the skin at bedtime.    Marland Kitchen LISINOPRIL-HYDROCHLOROTHIAZIDE 20-12.5 MG PO TABS Oral Take 2 tablets by mouth daily.      Marland Kitchen METFORMIN HCL 500 MG PO TABS Oral Take 500 mg by mouth 2 (two) times daily.      . MULTIVITAMINS PO CAPS Oral Take 1 capsule by mouth daily.      Marland Kitchen SIMVASTATIN 40 MG PO TABS Oral Take 40 mg by mouth at bedtime.     Marland Kitchen ZOLPIDEM TARTRATE 10 MG PO TABS Oral Take 10 mg by mouth at bedtime as needed. For sleep    . INSULIN SYRINGE 31G X 5/16" 0.3 ML MISC  Use as directed two times daily       BP 186/112  Pulse 105  Temp 98.2 F (36.8 C) (Oral)  Resp 18  SpO2 93%  Physical Exam  Nursing note and vitals reviewed. Constitutional: He is oriented to person, place, and time. He appears well-developed and well-nourished. No distress.  HENT:  Head: Normocephalic and atraumatic.  Mouth/Throat:    Eyes: EOM are normal.  Neck: Normal range of motion. No tracheal deviation present.  Cardiovascular: Normal rate.   Pulmonary/Chest: Effort normal. No respiratory distress.  Musculoskeletal: Normal range of motion.  Neurological: He is alert and oriented to person, place, and time.  Skin: Skin is warm and dry. No rash noted.  Psychiatric: He has a  normal mood and affect. His behavior is normal.    ED Course  Procedures (including critical care time)  DIAGNOSTIC STUDIES: Oxygen Saturation is 93% on room air, low by my interpretation.    COORDINATION OF CARE:  1:23PM - EDMD will order Rx abx and pain meds for the pt. EDMD advises f/u with regular dentist for dental extraction of affected tooth. Pt ready for d/c.   Labs Reviewed - No data to display No results found.   1. Dental caries       MDM   Pt with dental caries and no facial swelling.  No signs of ludwig's angina or difficulty swallowing and no systemic symptoms. Will treat with PCN and have pt f/u  with dentist.  I personally performed the services described in this documentation, which was scribed in my presence.  The recorded information has been reviewed and considered.       Blanchie Dessert, MD 02/12/12 1331  Blanchie Dessert, MD 02/12/12 1331  Blanchie Dessert, MD 02/12/12 [place], MD 02/12/12 1333

## 2012-03-11 ENCOUNTER — Ambulatory Visit (INDEPENDENT_AMBULATORY_CARE_PROVIDER_SITE_OTHER): Payer: BC Managed Care – PPO | Admitting: Endocrinology

## 2012-03-11 ENCOUNTER — Encounter: Payer: Self-pay | Admitting: Endocrinology

## 2012-03-11 ENCOUNTER — Other Ambulatory Visit: Payer: Self-pay

## 2012-03-11 VITALS — BP 154/92 | HR 96 | Temp 98.5°F | Wt 223.0 lb

## 2012-03-11 DIAGNOSIS — K219 Gastro-esophageal reflux disease without esophagitis: Secondary | ICD-10-CM

## 2012-03-11 MED ORDER — OMEPRAZOLE 20 MG PO CPDR: 20.0000 mg | DELAYED_RELEASE_CAPSULE | Freq: Two times a day (BID) | ORAL | Status: DC

## 2012-03-11 NOTE — Patient Instructions (Addendum)
i have sent a prescription to your pharmacy, to resume the prilosec. [facility]

## 2012-03-11 NOTE — Progress Notes (Signed)
  Subjective:    Patient ID: Hector Mahoney, male    DOB: [birth year], 41 y.o.   MRN: [id number]  HPI Pt states few years of intermittent moderate heartburn sensation at the epigastric area.  No assoc dysphagia.  There is no particular food that worsens it.  He says the prilosec helps, but sxs recur when he runs out. Past Medical History  Diagnosis Date  . ABSCESS, TOOTH 04/23/2009  . DIABETES MELLITUS, TYPE I 07/30/2007  . Esophageal reflux 04/27/2009  . FATIGUE 04/27/2009  . HYPERLIPIDEMIA 04/24/2007  . HYPERTENSION 04/24/2007  . INSOMNIA-SLEEP DISORDER-UNSPEC 04/27/2009  . LUMBAR STRAIN, ACUTE 12/11/2008  . RASH-NONVESICULAR 03/11/2008    No past surgical history on file.  History   Social History  . Marital Status: Married    Spouse Name: N/A    Number of Children: N/A  . Years of Education: N/A   Occupational History  . Not on file.   Social History Main Topics  . Smoking status: Former Research scientist (life sciences)  . Smokeless tobacco: Not on file  . Alcohol Use: Yes  . Drug Use: No  . Sexually Active: Not on file   Other Topics Concern  . Not on file   Social History Narrative  . No narrative on file    Current Outpatient Prescriptions on File Prior to Visit  Medication Sig Dispense Refill  . insulin aspart (NOVOLOG) 100 UNIT/ML injection Inject 20 Units into the skin 3 (three) times daily before meals.      . insulin glargine (LANTUS) 100 UNIT/ML injection Inject 40 Units into the skin at bedtime.      . Insulin Syringe-Needle U-100 (INSULIN SYRINGE .3CC/31GX5/16") 31G X 5/16" 0.3 ML MISC Use as directed two times daily       . lisinopril-hydrochlorothiazide (PRINZIDE,ZESTORETIC) 20-12.5 MG per tablet Take 2 tablets by mouth daily.        . metFORMIN (GLUCOPHAGE) 500 MG tablet Take 500 mg by mouth 2 (two) times daily.        . Multiple Vitamin (MULTIVITAMIN) capsule Take 1 capsule by mouth daily.        Marland Kitchen omeprazole (PRILOSEC) 20 MG capsule Take 1 capsule (20 mg total) by mouth 2  (two) times daily.  180 capsule  3  . simvastatin (ZOCOR) 40 MG tablet Take 40 mg by mouth at bedtime.       Marland Kitchen zolpidem (AMBIEN) 10 MG tablet Take 10 mg by mouth at bedtime as needed. For sleep       No Known Allergies  No family history on file.  BP 154/92  Pulse 96  Temp 98.5 F (36.9 C) (Oral)  Wt 223 lb (101.152 kg)  SpO2 97%  Review of Systems Denies weight loss and brbpr.    Objective:   Physical Exam VITAL SIGNS:  See vs page. GENERAL: no distress. ABDOMEN: abdomen is soft, nontender.  no hepatosplenomegaly.  not distended.  no hernia.      Assessment & Plan:  Jerrye Bushy, recurrent

## 2012-05-02 ENCOUNTER — Telehealth: Payer: Self-pay | Admitting: Internal Medicine

## 2012-05-02 MED ORDER — TRAZODONE HCL 50 MG PO TABS: 25.0000 mg | ORAL_TABLET | Freq: Every evening | ORAL | Status: DC | PRN

## 2012-05-02 NOTE — Telephone Encounter (Signed)
Pt requesting today appt for insomnia--can I work pt in your sched

## 2012-05-02 NOTE — Telephone Encounter (Signed)
Ok to try trazodone 50 qhs - done per emr

## 2012-05-02 NOTE — Telephone Encounter (Signed)
Patient informed. 

## 2012-12-22 ENCOUNTER — Emergency Department (HOSPITAL_COMMUNITY): Payer: Federal, State, Local not specified - PPO

## 2012-12-22 ENCOUNTER — Inpatient Hospital Stay (HOSPITAL_COMMUNITY): Payer: Federal, State, Local not specified - PPO

## 2012-12-22 ENCOUNTER — Observation Stay (HOSPITAL_COMMUNITY)
Admission: EM | Admit: 2012-12-22 | Discharge: 2012-12-23 | Disposition: A | Discharge status: Home / Self Care | Payer: Federal, State, Local not specified - PPO | Attending: Internal Medicine | Admitting: Internal Medicine

## 2012-12-22 ENCOUNTER — Encounter (HOSPITAL_COMMUNITY): Payer: Self-pay | Admitting: *Deleted

## 2012-12-22 DIAGNOSIS — IMO0001 Reserved for inherently not codable concepts without codable children: Secondary | ICD-10-CM | POA: Diagnosis present

## 2012-12-22 DIAGNOSIS — R0600 Dyspnea, unspecified: Secondary | ICD-10-CM | POA: Diagnosis present

## 2012-12-22 DIAGNOSIS — E119 Type 2 diabetes mellitus without complications: Secondary | ICD-10-CM

## 2012-12-22 DIAGNOSIS — M549 Dorsalgia, unspecified: Secondary | ICD-10-CM

## 2012-12-22 DIAGNOSIS — E785 Hyperlipidemia, unspecified: Secondary | ICD-10-CM

## 2012-12-22 DIAGNOSIS — K219 Gastro-esophageal reflux disease without esophagitis: Secondary | ICD-10-CM

## 2012-12-22 DIAGNOSIS — Z79899 Other long term (current) drug therapy: Secondary | ICD-10-CM | POA: Insufficient documentation

## 2012-12-22 DIAGNOSIS — I129 Hypertensive chronic kidney disease with stage 1 through stage 4 chronic kidney disease, or unspecified chronic kidney disease: Secondary | ICD-10-CM | POA: Insufficient documentation

## 2012-12-22 DIAGNOSIS — I509 Heart failure, unspecified: Secondary | ICD-10-CM | POA: Insufficient documentation

## 2012-12-22 DIAGNOSIS — R0602 Shortness of breath: Secondary | ICD-10-CM

## 2012-12-22 DIAGNOSIS — I10 Essential (primary) hypertension: Secondary | ICD-10-CM

## 2012-12-22 DIAGNOSIS — J811 Chronic pulmonary edema: Secondary | ICD-10-CM | POA: Insufficient documentation

## 2012-12-22 DIAGNOSIS — I5033 Acute on chronic diastolic (congestive) heart failure: Principal | ICD-10-CM | POA: Insufficient documentation

## 2012-12-22 DIAGNOSIS — N183 Chronic kidney disease, stage 3 unspecified: Secondary | ICD-10-CM | POA: Insufficient documentation

## 2012-12-22 HISTORY — DX: Shortness of breath: R06.02

## 2012-12-22 LAB — BASIC METABOLIC PANEL
BUN: 19 mg/dL (ref 6–23)
Chloride: 101 mEq/L (ref 96–112)
GFR calc Af Amer: 57 mL/min — ABNORMAL LOW (ref >90)
GFR calc non Af Amer: 49 mL/min — ABNORMAL LOW (ref >90)
Glucose, Bld: 156 mg/dL — ABNORMAL HIGH (ref 70–99)
Potassium: 3.9 mEq/L (ref 3.5–5.1)
Sodium: 138 mEq/L (ref 135–145)

## 2012-12-22 LAB — CBC WITH DIFFERENTIAL/PLATELET
Eosinophils Absolute: 0.1 10*3/uL (ref 0.0–0.7)
Eosinophils Relative: 1 % (ref 0–5)
HCT: 41.8 % (ref 39.0–52.0)
Hemoglobin: 13.8 g/dL (ref 13.0–17.0)
Lymphs Abs: 2.4 10*3/uL (ref 0.7–4.0)
MCH: 25.7 pg — ABNORMAL LOW (ref 26.0–34.0)
MCHC: 33 g/dL (ref 30.0–36.0)
MCV: 77.8 fL — ABNORMAL LOW (ref 78.0–100.0)
Monocytes Absolute: 0.5 10*3/uL (ref 0.1–1.0)
Monocytes Relative: 5 % (ref 3–12)
Neutrophils Relative %: 66 % (ref 43–77)
RBC: 5.37 MIL/uL (ref 4.22–5.81)

## 2012-12-22 LAB — TROPONIN I
Troponin I: <0.3 ng/mL (ref <0.30)
Troponin I: <0.3 ng/mL (ref <0.30)
Troponin I: <0.3 ng/mL (ref <0.30)

## 2012-12-22 LAB — D-DIMER, QUANTITATIVE: D-Dimer, Quant: 0.68 ug/mL-FEU — ABNORMAL HIGH (ref 0.00–0.48)

## 2012-12-22 LAB — COMPREHENSIVE METABOLIC PANEL
ALT: 41 U/L (ref 0–53)
AST: 30 U/L (ref 0–37)
Albumin: 3.3 g/dL — ABNORMAL LOW (ref 3.5–5.2)
Calcium: 9.3 mg/dL (ref 8.4–10.5)
Creatinine, Ser: 1.56 mg/dL — ABNORMAL HIGH (ref 0.50–1.35)
Sodium: 138 mEq/L (ref 135–145)
Total Protein: 7.3 g/dL (ref 6.0–8.3)

## 2012-12-22 LAB — GLUCOSE, CAPILLARY
Glucose-Capillary: 277 mg/dL — ABNORMAL HIGH (ref 70–99)
Glucose-Capillary: 253 mg/dL — ABNORMAL HIGH (ref 70–99)

## 2012-12-22 LAB — CBC
HCT: 41.5 % (ref 39.0–52.0)
Hemoglobin: 13.7 g/dL (ref 13.0–17.0)
MCH: 25.3 pg — ABNORMAL LOW (ref 26.0–34.0)
MCHC: 33 g/dL (ref 30.0–36.0)
MCV: 77.6 fL — ABNORMAL LOW (ref 78.0–100.0)
Platelets: 270 10*3/uL (ref 150–400)
RBC: 5.31 MIL/uL (ref 4.22–5.81)
RDW: 14.2 % (ref 11.5–15.5)
WBC: 9.4 10*3/uL (ref 4.0–10.5)

## 2012-12-22 LAB — URINALYSIS, ROUTINE W REFLEX MICROSCOPIC
Bilirubin Urine: NEGATIVE
Ketones, ur: NEGATIVE mg/dL
Nitrite: NEGATIVE
Urobilinogen, UA: 0.2 mg/dL (ref 0.0–1.0)

## 2012-12-22 LAB — RAPID URINE DRUG SCREEN, HOSP PERFORMED
Amphetamines: NOT DETECTED
Cocaine: NOT DETECTED
Opiates: NOT DETECTED
Tetrahydrocannabinol: NOT DETECTED

## 2012-12-22 LAB — URINE MICROSCOPIC-ADD ON

## 2012-12-22 LAB — POCT I-STAT TROPONIN I

## 2012-12-22 MED ORDER — PANTOPRAZOLE SODIUM 40 MG PO TBEC
40.0000 mg | DELAYED_RELEASE_TABLET | Freq: Every day | ORAL | Status: DC
  Administered 2012-12-22 – 2012-12-23 (×2): 40 mg via ORAL
  Filled 2012-12-22 (×2): qty 1

## 2012-12-22 MED ORDER — TECHNETIUM TC 99M DIETHYLENETRIAME-PENTAACETIC ACID: 40.0000 | Freq: Once | INTRAVENOUS | Status: AC | PRN

## 2012-12-22 MED ORDER — FUROSEMIDE 40 MG PO TABS
40.0000 mg | ORAL_TABLET | Freq: Every day | ORAL | Status: DC
  Administered 2012-12-22: 40 mg via ORAL
  Filled 2012-12-22 (×2): qty 1

## 2012-12-22 MED ORDER — TRAZODONE HCL 50 MG PO TABS
25.0000 mg | ORAL_TABLET | Freq: Every evening | ORAL | Status: DC | PRN
  Filled 2012-12-22: qty 1

## 2012-12-22 MED ORDER — ASPIRIN EC 81 MG PO TBEC
81.0000 mg | DELAYED_RELEASE_TABLET | Freq: Every day | ORAL | Status: DC
  Administered 2012-12-22 – 2012-12-23 (×2): 81 mg via ORAL
  Filled 2012-12-22 (×2): qty 1

## 2012-12-22 MED ORDER — INSULIN ASPART 100 UNIT/ML ~~LOC~~ SOLN
20.0000 [IU] | Freq: Three times a day (TID) | SUBCUTANEOUS | Status: DC
  Administered 2012-12-22 – 2012-12-23 (×3): 20 [IU] via SUBCUTANEOUS

## 2012-12-22 MED ORDER — POTASSIUM CHLORIDE CRYS ER 20 MEQ PO TBCR
40.0000 meq | EXTENDED_RELEASE_TABLET | Freq: Once | ORAL | Status: AC
  Administered 2012-12-22: 40 meq via ORAL
  Filled 2012-12-22: qty 2

## 2012-12-22 MED ORDER — HYDROCHLOROTHIAZIDE 12.5 MG PO CAPS
12.5000 mg | ORAL_CAPSULE | Freq: Every day | ORAL | Status: DC
  Administered 2012-12-22: 12.5 mg via ORAL
  Filled 2012-12-22: qty 1

## 2012-12-22 MED ORDER — LISINOPRIL 20 MG PO TABS
20.0000 mg | ORAL_TABLET | Freq: Every day | ORAL | Status: DC
  Administered 2012-12-22 – 2012-12-23 (×2): 20 mg via ORAL
  Filled 2012-12-22 (×2): qty 1

## 2012-12-22 MED ORDER — FUROSEMIDE 10 MG/ML IJ SOLN
40.0000 mg | Freq: Once | INTRAMUSCULAR | Status: AC
  Administered 2012-12-22: 40 mg via INTRAVENOUS
  Filled 2012-12-22: qty 4

## 2012-12-22 MED ORDER — INSULIN GLARGINE 100 UNIT/ML ~~LOC~~ SOLN
40.0000 [IU] | Freq: Every day | SUBCUTANEOUS | Status: DC
Start: 2012-12-22 — End: 2012-12-23
  Administered 2012-12-22: 40 [IU] via SUBCUTANEOUS
  Filled 2012-12-22 (×2): qty 0.4

## 2012-12-22 MED ORDER — SODIUM CHLORIDE 0.9 % IJ SOLN
3.0000 mL | Freq: Two times a day (BID) | INTRAMUSCULAR | Status: DC
  Administered 2012-12-22: 3 mL via INTRAVENOUS

## 2012-12-22 MED ORDER — LISINOPRIL-HYDROCHLOROTHIAZIDE 20-12.5 MG PO TABS: 2.0000 | ORAL_TABLET | Freq: Every day | ORAL | Status: DC

## 2012-12-22 MED ORDER — ZOLPIDEM TARTRATE 5 MG PO TABS: 10.0000 mg | ORAL_TABLET | Freq: Every evening | ORAL | Status: DC | PRN

## 2012-12-22 MED ORDER — PNEUMOCOCCAL VAC POLYVALENT 25 MCG/0.5ML IJ INJ
0.5000 mL | INJECTION | INTRAMUSCULAR | Status: DC
  Filled 2012-12-22: qty 0.5

## 2012-12-22 MED ORDER — ACETAMINOPHEN 325 MG PO TABS: 650.0000 mg | ORAL_TABLET | Freq: Four times a day (QID) | ORAL | Status: DC | PRN

## 2012-12-22 MED ORDER — ONDANSETRON HCL 4 MG/2ML IJ SOLN: 4.0000 mg | Freq: Four times a day (QID) | INTRAMUSCULAR | Status: DC | PRN

## 2012-12-22 MED ORDER — INSULIN ASPART 100 UNIT/ML ~~LOC~~ SOLN
0.0000 [IU] | Freq: Three times a day (TID) | SUBCUTANEOUS | Status: DC
  Administered 2012-12-22: 5 [IU] via SUBCUTANEOUS
  Administered 2012-12-22: 7 [IU] via SUBCUTANEOUS
  Administered 2012-12-23: 2 [IU] via SUBCUTANEOUS

## 2012-12-22 MED ORDER — SIMVASTATIN 40 MG PO TABS
40.0000 mg | ORAL_TABLET | Freq: Every day | ORAL | Status: DC
  Administered 2012-12-22: 40 mg via ORAL
  Filled 2012-12-22 (×2): qty 1

## 2012-12-22 MED ORDER — TECHNETIUM TO 99M ALBUMIN AGGREGATED
6.0000 | Freq: Once | INTRAVENOUS | Status: AC | PRN
  Administered 2012-12-22: 6 via INTRAVENOUS

## 2012-12-22 MED ORDER — ACETAMINOPHEN 650 MG RE SUPP: 650.0000 mg | Freq: Four times a day (QID) | RECTAL | Status: DC | PRN

## 2012-12-22 MED ORDER — ONDANSETRON HCL 4 MG PO TABS: 4.0000 mg | ORAL_TABLET | Freq: Four times a day (QID) | ORAL | Status: DC | PRN

## 2012-12-22 MED ORDER — ENOXAPARIN SODIUM 40 MG/0.4ML ~~LOC~~ SOLN
40.0000 mg | SUBCUTANEOUS | Status: DC
  Administered 2012-12-22: 40 mg via SUBCUTANEOUS
  Filled 2012-12-22 (×2): qty 0.4

## 2012-12-22 NOTE — ED Notes (Signed)
[place] MD at bedside.  

## 2012-12-22 NOTE — ED Provider Notes (Signed)
History     CSN: [id number]  Arrival date & time 12/22/12  0122   First MD Initiated Contact with Patient 12/22/12 0257      Chief Complaint  Patient presents with  . Shortness of Breath    (Consider location/radiation/quality/duration/timing/severity/associated sxs/prior treatment) HPI HX per patient. DOE and Orthopnea worse over the last 2 days, gets SOB walking to the mailbox and tonigth unable to sleep laying flat due to symptoms, no LE swelling, no CP or cough or fevers. No h/o same. Symptoms MOD in severity. No new medications, denies any drug use.  Past Medical History  Diagnosis Date  . ABSCESS, TOOTH 04/23/2009  . DIABETES MELLITUS, TYPE I 07/30/2007  . Esophageal reflux 04/27/2009  . FATIGUE 04/27/2009  . HYPERLIPIDEMIA 04/24/2007  . HYPERTENSION 04/24/2007  . INSOMNIA-SLEEP DISORDER-UNSPEC 04/27/2009  . LUMBAR STRAIN, ACUTE 12/11/2008  . RASH-NONVESICULAR 03/11/2008    No past surgical history on file.  No family history on file.  History  Substance Use Topics  . Smoking status: Former Research scientist (life sciences)  . Smokeless tobacco: Not on file  . Alcohol Use: Yes      Review of Systems  Constitutional: Negative for fever and chills.  HENT: Negative for neck pain and neck stiffness.   Eyes: Negative for pain.  Respiratory: Positive for shortness of breath.   Cardiovascular: Negative for chest pain and leg swelling.  Gastrointestinal: Negative for abdominal pain.  Genitourinary: Negative for dysuria.  Musculoskeletal: Negative for back pain.  Skin: Negative for rash.  Neurological: Negative for headaches.  All other systems reviewed and are negative.    Allergies  Review of patient's allergies indicates no known allergies.  Home Medications   Current Outpatient Rx  Name  Route  Sig  Dispense  Refill  . insulin aspart (NOVOLOG) 100 UNIT/ML injection   Subcutaneous   Inject 20 Units into the skin 3 (three) times daily before meals.         . insulin glargine  (LANTUS) 100 UNIT/ML injection   Subcutaneous   Inject 40 Units into the skin at bedtime.         . Insulin Syringe-Needle U-100 (INSULIN SYRINGE .3CC/31GX5/16") 31G X 5/16" 0.3 ML MISC      Use as directed two times daily          . lisinopril-hydrochlorothiazide (PRINZIDE,ZESTORETIC) 20-12.5 MG per tablet   Oral   Take 2 tablets by mouth daily.           . metFORMIN (GLUCOPHAGE) 500 MG tablet   Oral   Take 500 mg by mouth 2 (two) times daily.           . Multiple Vitamin (MULTIVITAMIN) capsule   Oral   Take 1 capsule by mouth daily.           Marland Kitchen omeprazole (PRILOSEC) 20 MG capsule   Oral   Take 1 capsule (20 mg total) by mouth 2 (two) times daily.   180 capsule   3   . simvastatin (ZOCOR) 40 MG tablet   Oral   Take 40 mg by mouth at bedtime.          . traZODone (DESYREL) 50 MG tablet   Oral   Take 0.5-1 tablets (25-50 mg total) by mouth at bedtime as needed for sleep.   30 tablet   3   . zolpidem (AMBIEN) 10 MG tablet   Oral   Take 10 mg by mouth at bedtime as needed. For sleep  BP 190/101  Pulse 115  Temp(Src) 99.3 F (37.4 C) (Oral)  Resp 18  SpO2 90%  Physical Exam  Constitutional: He is oriented to person, place, and time. He appears well-developed and well-nourished.  HENT:  Head: Normocephalic and atraumatic.  Eyes: EOM are normal. Pupils are equal, round, and reactive to light.  Neck: Neck supple.  Cardiovascular: Normal rate, regular rhythm and intact distal pulses.   Pulmonary/Chest: Effort normal. No respiratory distress.  Mildly dec bilateral breath sounds, no rales  Abdominal: Bowel sounds are normal. He exhibits no distension. There is no tenderness.  Musculoskeletal: Normal range of motion. He exhibits no edema and no tenderness.  Neurological: He is alert and oriented to person, place, and time.  Skin: Skin is warm and dry.    ED Course  Procedures (including critical care time)  Labs Reviewed  CBC - Abnormal;  Notable for the following:    MCH 25.8 (*)    All other components within normal limits  BASIC METABOLIC PANEL - Abnormal; Notable for the following:    Glucose, Bld 156 (*)    Creatinine, Ser 1.68 (*)    GFR calc non Af Amer 49 (*)    GFR calc Af Amer 57 (*)    All other components within normal limits  PRO B NATRIURETIC PEPTIDE - Abnormal; Notable for the following:    Pro B Natriuretic peptide (BNP) 396.2 (*)    All other components within normal limits   Dg Chest 2 View  12/22/2012  *RADIOLOGY REPORT*  Clinical Data: Shortness of breath  CHEST - 2 VIEW  Comparison: None.  Findings: Interstitial and patchy airspace opacities, most pronounced right lower lobe.  Heart size upper normal to mildly enlarged.  Mediastinal contours otherwise within normal range.  No pleural effusion or pneumothorax.  No acute osseous finding.  IMPRESSION: Interstitial and patchy airspace opacities may reflect pulmonary edema or multifocal infection. Recommend short-term radiograph follow-up to document resolution.   Original Report Authenticated By: Carlos Levering, M.D.       Date: 12/22/2012  Rate: 111  Rhythm: sinus tachycardia  QRS Axis: normal  Intervals: normal  ST/T Wave abnormalities: nonspecific ST changes  Conduction Disutrbances:none  Narrative Interpretation:   Old EKG Reviewed: none available  D/w triad , Dr Hal Hope recommends admit. IV lasix provided.   MDM  SOB/ orthopnea/ DOE  CXR, labs, ECG  Persistent tachycardia in ED  Borderline hypoxia/ O2 requirement  MED admit        Teressa Lower, MD 12/23/12 [phone]

## 2012-12-22 NOTE — Progress Notes (Signed)
*  PRELIMINARY RESULTS* Echocardiogram 2D Echocardiogram has been performed.  Crosby, Gans 12/22/2012, 11:12 AM

## 2012-12-22 NOTE — H&P (Signed)
[facility] History and Physical  Hector Mahoney [id number] DOB: 07-Feb-1971 DOA: 12/22/2012  Referring physician: Dr. Marnette Burgess. PCP: Cathlean Cower, MD  Specialists: Dr. Loanne Drilling endocrinologist.  Chief Complaint: Shortness of breath.  HPI: Hector Mahoney is a 42 y.o. male history of diabetes mellitus type 2, hypertension and hyperlipidemia presented to the ER because of shortness of breath. Patient has been having shortness of breath over the last 4 days. Patient's shortness of breath increases on exertion and on lying flat. Denies any productive cough fever chills chest pain. Last night his shortness of breath worsened and patient was feeling uncomfortable and was brought to the ER. In the ER patient was found to be having uncontrolled blood pressure with chest x-ray showing congestion and patient was tachycardic. At this time patient has been admitted for shortness of breath most likely from CHF. One dose of Lasix 40 mg IV was given after which his shortness of breath is improved and his blood pressure improved. Patient otherwise denies any nausea vomiting abdominal pain diarrhea. He states sometimes he misses medications and the last time he missed his medication was a week ago.  Review of Systems: As presented in the history of presenting illness, rest negative.  Past Medical History  Diagnosis Date  . ABSCESS, TOOTH 04/23/2009  . DIABETES MELLITUS, TYPE I 07/30/2007  . Esophageal reflux 04/27/2009  . FATIGUE 04/27/2009  . HYPERLIPIDEMIA 04/24/2007  . HYPERTENSION 04/24/2007  . INSOMNIA-SLEEP DISORDER-UNSPEC 04/27/2009  . LUMBAR STRAIN, ACUTE 12/11/2008  . RASH-NONVESICULAR 03/11/2008   History reviewed. No pertinent past surgical history. Social History:  reports that he has quit smoking. He does not have any smokeless tobacco history on file. He reports that  drinks alcohol. He reports that he does not use illicit drugs. Lives at home. where does patient live-- Can do ADLs. Can  patient participate in ADLs?  No Known Allergies  Family History  Problem Relation Age of Onset  . Hypertension Mother       Prior to Admission medications   Medication Sig Start Date End Date Taking? Authorizing Provider  ibuprofen (ADVIL,MOTRIN) 200 MG tablet Take 400 mg by mouth every 6 (six) hours as needed for pain.   Yes Historical Provider, MD  insulin aspart (NOVOLOG) 100 UNIT/ML injection Inject 20 Units into the skin 3 (three) times daily before meals.   Yes Historical Provider, MD  insulin glargine (LANTUS) 100 UNIT/ML injection Inject 40 Units into the skin at bedtime.   Yes Historical Provider, MD  lisinopril-hydrochlorothiazide (PRINZIDE,ZESTORETIC) 20-12.5 MG per tablet Take 2 tablets by mouth daily.     Yes Historical Provider, MD  metFORMIN (GLUCOPHAGE) 500 MG tablet Take 500 mg by mouth 2 (two) times daily.     Yes Historical Provider, MD  Multiple Vitamin (MULTIVITAMIN) capsule Take 1 capsule by mouth daily.     Yes Historical Provider, MD  omeprazole (PRILOSEC) 20 MG capsule Take 1 capsule (20 mg total) by mouth 2 (two) times daily. 03/11/12  Yes Biagio Borg, MD  simvastatin (ZOCOR) 40 MG tablet Take 40 mg by mouth at bedtime.    Yes Historical Provider, MD  tadalafil (CIALIS) 5 MG tablet Take 5 mg by mouth daily.   Yes Historical Provider, MD  traZODone (DESYREL) 50 MG tablet Take 0.5-1 tablets (25-50 mg total) by mouth at bedtime as needed for sleep. 05/02/12  Yes Biagio Borg, MD  zolpidem (AMBIEN) 10 MG tablet Take 10 mg by mouth at bedtime as needed. For sleep  Yes Historical Provider, MD  Insulin Syringe-Needle U-100 (INSULIN SYRINGE .3CC/31GX5/16") 31G X 5/16" 0.3 ML MISC Use as directed two times daily     Historical Provider, MD   Physical Exam: Filed Vitals:   12/22/12 0330 12/22/12 0400 12/22/12 0415 12/22/12 0430  BP: 163/106 173/111  140/88  Pulse: 110 92  53  Temp:      TempSrc:      Resp: 27 25  18   SpO2: 91% 98% 99%      General:  Well-developed  and nourished.  Eyes: Anicteric no pallor.  ENT: No discharge from the ears eyes nose mouth.  Neck: No mass felt.  Cardiovascular: S1-S2 heard.  Respiratory: No rhonchi or crepitations.  Abdomen: Soft nontender bowel sounds present.  Skin: No rash.  Musculoskeletal: No edema.  Psychiatric: Appears normal.  Neurologic: Alert awake oriented to time place and person. Moves all extremities.  Labs on Admission:  Basic Metabolic Panel:  Recent Labs Lab 12/22/12 0128  NA 138  K 3.9  CL 101  CO2 26  GLUCOSE 156*  BUN 19  CREATININE 1.68*  CALCIUM 9.0   Liver Function Tests: No results found for this basename: AST, ALT, ALKPHOS, BILITOT, PROT, ALBUMIN,  in the last 168 hours No results found for this basename: LIPASE, AMYLASE,  in the last 168 hours No results found for this basename: AMMONIA,  in the last 168 hours CBC:  Recent Labs Lab 12/22/12 0128  WBC 9.4  HGB 13.7  HCT 41.5  MCV 78.2  PLT 277   Cardiac Enzymes: No results found for this basename: CKTOTAL, CKMB, CKMBINDEX, TROPONINI,  in the last 168 hours  BNP (last 3 results)  Recent Labs  12/22/12 0128  PROBNP 396.2*   CBG: No results found for this basename: GLUCAP,  in the last 168 hours  Radiological Exams on Admission: Dg Chest 2 View  12/22/2012  *RADIOLOGY REPORT*  Clinical Data: Shortness of breath  CHEST - 2 VIEW  Comparison: None.  Findings: Interstitial and patchy airspace opacities, most pronounced right lower lobe.  Heart size upper normal to mildly enlarged.  Mediastinal contours otherwise within normal range.  No pleural effusion or pneumothorax.  No acute osseous finding.  IMPRESSION: Interstitial and patchy airspace opacities may reflect pulmonary edema or multifocal infection. Recommend short-term radiograph follow-up to document resolution.   Original Report Authenticated By: Carlos Levering, M.D.     EKG: Independently reviewed. Sinus tachycardia.  Assessment/Plan Principal  Problem:   SOB (shortness of breath) Active Problems:   HYPERLIPIDEMIA   HYPERTENSION   Diabetes mellitus   1. Shortness of breath most likely from CHF - patient was given one dose of Lasix 40 mg IV. At this time I have written for 40 of Lasix by mouth daily. Continue his home medications including lisinopril HCTZ. Closely follow intake output and daily weights and metabolic panel. Check 2-D echo. Patient's d-dimer is pending. 2. Hypertension uncontrolled - at this time it is improved with Lasix. Closely follow blood pressure trends. 3. Diabetes mellitus2 - continue home medications with sliding-scale coverage. 4. Hyperlipidemia - continue home medications.  Patient is on Cialis and will need to avoid nitroglycerin at least for 36 hours. Last dose was yesterday morning.    Code Status: Full code.  Family Communication: None.  Disposition Plan: Admit to inpatient.    Lateya Dauria N. [facility] Pager [phone].  If 7PM-7AM, please contact night-coverage [email or web address] Password [facility] 12/22/2012, 5:05 AM

## 2012-12-22 NOTE — Progress Notes (Signed)
Patient seen and examined Admitted wih diastolic chf Diuresis and education Dc tomorrow Hector Mahoney'

## 2012-12-22 NOTE — ED Notes (Signed)
Phlebotomist at bedside.

## 2012-12-22 NOTE — Progress Notes (Signed)
RN notified by central telemetry that patient was in aflutter.  RN obtained EKG and patient appears to be in NSR.  Patient denies any symptoms.  Strip printed from monitor during aflutter event.  MD notified.  Will continue to monitor. [place], Ardeth Sportsman

## 2012-12-22 NOTE — ED Notes (Signed)
Per Merry Proud in pharmacy. Dose Lovenox will be sent to Floor

## 2012-12-22 NOTE — ED Notes (Signed)
Pt c/o SOB starting at 930pm. Denies pain

## 2012-12-23 DIAGNOSIS — R0602 Shortness of breath: Secondary | ICD-10-CM

## 2012-12-23 DIAGNOSIS — E119 Type 2 diabetes mellitus without complications: Secondary | ICD-10-CM

## 2012-12-23 DIAGNOSIS — K219 Gastro-esophageal reflux disease without esophagitis: Secondary | ICD-10-CM

## 2012-12-23 DIAGNOSIS — M549 Dorsalgia, unspecified: Secondary | ICD-10-CM

## 2012-12-23 LAB — CBC
MCHC: 33 g/dL (ref 30.0–36.0)
MCV: 78.4 fL (ref 78.0–100.0)
Platelets: 280 10*3/uL (ref 150–400)
RDW: 14.5 % (ref 11.5–15.5)
WBC: 8.9 10*3/uL (ref 4.0–10.5)

## 2012-12-23 LAB — BASIC METABOLIC PANEL
BUN: 27 mg/dL — ABNORMAL HIGH (ref 6–23)
CO2: 31 mEq/L (ref 19–32)
Calcium: 9.7 mg/dL (ref 8.4–10.5)
Creatinine, Ser: 1.71 mg/dL — ABNORMAL HIGH (ref 0.50–1.35)

## 2012-12-23 MED ORDER — METOPROLOL SUCCINATE ER 25 MG PO TB24: 25.0000 mg | ORAL_TABLET | Freq: Every day | ORAL | Status: DC

## 2012-12-23 MED ORDER — FUROSEMIDE 40 MG PO TABS: 40.0000 mg | ORAL_TABLET | Freq: Every day | ORAL | Status: DC | PRN

## 2012-12-23 MED ORDER — ASPIRIN 81 MG PO TBEC: 81.0000 mg | DELAYED_RELEASE_TABLET | Freq: Every day | ORAL | Status: DC

## 2012-12-23 NOTE — Discharge Summary (Signed)
Physician Discharge Summary  Hector Mahoney [id number] DOB: 1970-10-25 DOA: 12/22/2012  PCP: Cathlean Cower, MD  Admit date: 12/22/2012 Discharge date: 12/23/2012  Time spent: 35 minutes  Recommendations for Outpatient Follow-up:  1. New diagnosis CHF - follow up on compliance with diet restriction  2. Needs careful monitoring of BMET   Discharge Diagnoses:  Acute on chronic diastolic CHF    HYPERLIPIDEMIA   HYPERTENSION   Diabetes mellitus CKD stage 3    Discharge Condition: good  Diet recommendation: heart healthy  Filed Weights   12/22/12 0925 12/23/12 0523  Weight: 97.07 kg (214 lb) 97.659 kg (215 lb 4.8 oz)    History of present illness:  Hector Mahoney is a 42 y.o. male history of diabetes mellitus type 2, hypertension and hyperlipidemia presented to the ER because of shortness of breath. Patient has been having shortness of breath over the last 4 days. Patient's shortness of breath increases on exertion and on lying flat. Denies any productive cough fever chills chest pain. Last night his shortness of breath worsened and patient was feeling uncomfortable and was brought to the ER. In the ER patient was found to be having uncontrolled blood pressure with chest x-ray showing congestion and patient was tachycardic. At this time patient has been admitted for shortness of breath most likely from CHF. One dose of Lasix 40 mg IV was given after which his shortness of breath is improved and his blood pressure improved. Patient otherwise denies any nausea vomiting abdominal pain diarrhea. He states sometimes he misses medications and the last time he missed his medication was a week ago.      Hospital Course:  1. New diagnosis of CHF - patient diuresed, and symptoms improved markedly . Echo confirms diastolic dysfunction. Plan for acei and BB and lasix prn  2. CKD - stage 3 outpt f/u 3. DM - per endocrine     Procedures:  Echo   Consultations:  none  Discharge  Exam: Filed Vitals:   12/22/12 1435 12/22/12 1726 12/22/12 2053 12/23/12 0523  BP: 137/90 166/104 140/93 132/88  Pulse: 95  91 93  Temp: 98.4 F (36.9 C)  98.2 F (36.8 C) 98 F (36.7 C)  TempSrc:   Oral Oral  Resp: 22  18 18   Height:      Weight:    97.659 kg (215 lb 4.8 oz)  SpO2: 100%  100% 99%    General: axox3 Cardiovascular: rrr Respiratory: ctab   Discharge Instructions  Discharge Orders   Future Orders Complete By Expires     (HEART FAILURE PATIENTS) Call MD:  Anytime you have any of the following symptoms: 1) 3 pound weight gain in 24 hours or 5 pounds in 1 week 2) shortness of breath, with or without a dry hacking cough 3) swelling in the hands, feet or stomach 4) if you have to sleep on extra pillows at night in order to breathe.  As directed     Diet - low sodium heart healthy  As directed     Increase activity slowly  As directed         Medication List    STOP taking these medications       ibuprofen 200 MG tablet  Commonly known as:  ADVIL,MOTRIN     INSULIN SYRINGE .3CC/31GX5/16" 31G X 5/16" 0.3 ML Misc      TAKE these medications       aspirin 81 MG EC tablet  Take 1 tablet (81 mg  total) by mouth daily.     furosemide 40 MG tablet  Commonly known as:  LASIX  Take 1 tablet (40 mg total) by mouth daily as needed (for wieght gain of 2 pounds or more).     insulin aspart 100 UNIT/ML injection  Commonly known as:  novoLOG  Inject 20 Units into the skin 3 (three) times daily before meals.     insulin glargine 100 UNIT/ML injection  Commonly known as:  LANTUS  Inject 40 Units into the skin at bedtime.     lisinopril-hydrochlorothiazide 20-12.5 MG per tablet  Commonly known as:  PRINZIDE,ZESTORETIC  Take 2 tablets by mouth daily.     metFORMIN 500 MG tablet  Commonly known as:  GLUCOPHAGE  Take 500 mg by mouth 2 (two) times daily.     metoprolol succinate 25 MG 24 hr tablet  Commonly known as:  TOPROL XL  Take 1 tablet (25 mg total) by mouth  daily.     multivitamin capsule  Take 1 capsule by mouth daily.     omeprazole 20 MG capsule  Commonly known as:  PRILOSEC  Take 1 capsule (20 mg total) by mouth 2 (two) times daily.     tadalafil 5 MG tablet  Commonly known as:  CIALIS  Take 5 mg by mouth daily.     traZODone 50 MG tablet  Commonly known as:  DESYREL  Take 0.5-1 tablets (25-50 mg total) by mouth at bedtime as needed for sleep.     ZOCOR 40 MG tablet  Generic drug:  simvastatin  Take 40 mg by mouth at bedtime.     zolpidem 10 MG tablet  Commonly known as:  AMBIEN  Take 10 mg by mouth at bedtime as needed. For sleep       No Known Allergies     Follow-up Information   Schedule an appointment as soon as possible for a visit with Cathlean Cower, MD.   Contact information:   [place] [address] [phone]        The results of significant diagnostics from this hospitalization (including imaging, microbiology, ancillary and laboratory) are listed below for reference.    Significant Diagnostic Studies: Dg Chest 2 View  12/22/2012  *RADIOLOGY REPORT*  Clinical Data: Shortness of breath  CHEST - 2 VIEW  Comparison: None.  Findings: Interstitial and patchy airspace opacities, most pronounced right lower lobe.  Heart size upper normal to mildly enlarged.  Mediastinal contours otherwise within normal range.  No pleural effusion or pneumothorax.  No acute osseous finding.  IMPRESSION: Interstitial and patchy airspace opacities may reflect pulmonary edema or multifocal infection. Recommend short-term radiograph follow-up to document resolution.   Original Report Authenticated By: Carlos Levering, M.D.    Nm Pulmonary Perf And Vent  12/22/2012  *RADIOLOGY REPORT*  Clinical Data:  Shortness of breath for 4 days, history diabetes, hypertension  NUCLEAR MEDICINE VENTILATION - PERFUSION LUNG SCAN  Technique:  Ventilation images were obtained in multiple projections using inhaled aerosol Tc-6m  DTPA.  Perfusion images were obtained in multiple projections after intravenous injection of Tc-58m MAA.  Radiopharmaceuticals:  53mCi Tc-52m DTPA aerosol and 6 mCi Tc-66m MAA.  Comparison: None Correlation:  Chest radiograph 12/22/2012  Findings:  Ventilation:  Normal ventilation.  Small amount of swallowed tracer within the stomach.  Perfusion:  Normal exam.  No perfusion defects identified.  IMPRESSION: Normal exam.   Original Report Authenticated By: Lavonia Dana, M.D.  Microbiology: No results found for this or any previous visit (from the past 240 hour(s)).   Labs: Basic Metabolic Panel:  Recent Labs Lab 12/22/12 0128 12/22/12 0745 12/23/12 0500  NA 138 138 139  K 3.9 3.8 3.9  CL 101 101 100  CO2 26 30 31   GLUCOSE 156* 292* 213*  BUN 19 22 27*  CREATININE 1.68* 1.56* 1.71*  CALCIUM 9.0 9.3 9.7   Liver Function Tests:  Recent Labs Lab 12/22/12 0745  AST 30  ALT 41  ALKPHOS 86  BILITOT 0.6  PROT 7.3  ALBUMIN 3.3*   No results found for this basename: LIPASE, AMYLASE,  in the last 168 hours No results found for this basename: AMMONIA,  in the last 168 hours CBC:  Recent Labs Lab 12/22/12 0128 12/22/12 0745 12/22/12 0816 12/23/12 0500  WBC 9.4 8.7 10.1 8.9  NEUTROABS  --  5.8  --   --   HGB 13.7 13.8 14.0 13.8  HCT 41.5 41.8 43.0 41.8  MCV 78.2 77.8* 77.6* 78.4  PLT 277 258 270 280   Cardiac Enzymes:  Recent Labs Lab 12/22/12 0745 12/22/12 1144 12/22/12 1755  TROPONINI <0.30 <0.30 <0.30   BNP: BNP (last 3 results)  Recent Labs  12/22/12 0128  PROBNP 396.2*   CBG:  Recent Labs Lab 12/22/12 1143 12/22/12 1441 12/22/12 1629 12/22/12 2052 12/23/12 0731  GLUCAP 320* 277* 253* 226* 195*       Signed:  Khyre Germond  [facility] 12/23/2012, 9:22 AM

## 2013-01-02 ENCOUNTER — Other Ambulatory Visit (INDEPENDENT_AMBULATORY_CARE_PROVIDER_SITE_OTHER): Payer: Federal, State, Local not specified - PPO

## 2013-01-02 ENCOUNTER — Encounter: Payer: Self-pay | Admitting: Internal Medicine

## 2013-01-02 ENCOUNTER — Ambulatory Visit (INDEPENDENT_AMBULATORY_CARE_PROVIDER_SITE_OTHER): Payer: Federal, State, Local not specified - PPO | Admitting: Internal Medicine

## 2013-01-02 VITALS — BP 130/90 | HR 95 | Temp 98.0°F | Ht 66.0 in | Wt 223.4 lb

## 2013-01-02 DIAGNOSIS — I509 Heart failure, unspecified: Secondary | ICD-10-CM

## 2013-01-02 DIAGNOSIS — I5030 Unspecified diastolic (congestive) heart failure: Secondary | ICD-10-CM

## 2013-01-02 DIAGNOSIS — E785 Hyperlipidemia, unspecified: Secondary | ICD-10-CM

## 2013-01-02 DIAGNOSIS — E109 Type 1 diabetes mellitus without complications: Secondary | ICD-10-CM

## 2013-01-02 DIAGNOSIS — I10 Essential (primary) hypertension: Secondary | ICD-10-CM

## 2013-01-02 DIAGNOSIS — E119 Type 2 diabetes mellitus without complications: Secondary | ICD-10-CM

## 2013-01-02 LAB — LIPID PANEL
Cholesterol: 293 mg/dL — ABNORMAL HIGH (ref 0–200)
Total CHOL/HDL Ratio: 5

## 2013-01-02 LAB — BASIC METABOLIC PANEL
CO2: 27 mEq/L (ref 19–32)
Chloride: 103 mEq/L (ref 96–112)
Creatinine, Ser: 1.4 mg/dL (ref 0.4–1.5)

## 2013-01-02 LAB — HEPATIC FUNCTION PANEL
ALT: 23 U/L (ref 0–53)
Alkaline Phosphatase: 65 U/L (ref 39–117)
Bilirubin, Direct: 0 mg/dL (ref 0.0–0.3)
Total Protein: 7.6 g/dL (ref 6.0–8.3)

## 2013-01-02 NOTE — Assessment & Plan Note (Signed)
stable overall by history and exam but may need to reduce meds if cont's to have low sugars more frequently,  and pt to continue medical treatment as before,  to f/u any worsening symptoms or concerns, for f/u labs today, cont efforts at diet and wt loss, excercise

## 2013-01-02 NOTE — Assessment & Plan Note (Signed)
With recent exac now improved, stable, for bmet

## 2013-01-02 NOTE — Assessment & Plan Note (Signed)
stable overall by history and exam, recent data reviewed with pt, and pt to continue medical treatment as before,  to f/u any worsening symptoms or concerns BP Readings from Last 3 Encounters:  01/02/13 130/90  12/23/12 132/88  03/11/12 154/92

## 2013-01-02 NOTE — Progress Notes (Signed)
Subjective:    Patient ID: Hector Mahoney, male    DOB: September 15, 1970, 42 y.o.   MRN: [id number]  HPI  Here to f/u recent hospn with exac of diast CHF, overall doing ok,  Pt denies chest pain, increased sob or doe, wheezing, orthopnea, PND, increased LE swelling, palpitations, dizziness or syncope.  Pt denies polydipsia, polyuria, but has had a couple of low sugar symptoms episodes as he has been much more diligent with diet post hospn, cutting back on calories and low salt.  Pt denies new neurological symptoms such as new headache, or facial or extremity weakness or numbness.   Pt states overall good compliance with meds, wt stable post hospn, . Pt denies fever, wt loss, night sweats, loss of appetite, or other constitutional symptoms  No other new complaints Past Medical History  Diagnosis Date  . ABSCESS, TOOTH 04/23/2009  . Esophageal reflux 04/27/2009  . FATIGUE 04/27/2009  . HYPERLIPIDEMIA 04/24/2007  . HYPERTENSION 04/24/2007  . INSOMNIA-SLEEP DISORDER-UNSPEC 04/27/2009  . LUMBAR STRAIN, ACUTE 12/11/2008  . RASH-NONVESICULAR 03/11/2008  . Shortness of breath     "lying down and w/exertion right now" (12/22/2012)  . DIABETES MELLITUS, TYPE I 07/30/2007   Past Surgical History  Procedure Laterality Date  . No past surgeries      reports that he has quit smoking. His smoking use included Cigarettes. He smoked 0.00 packs per day. He has never used smokeless tobacco. He reports that he drinks about 1.2 ounces of alcohol per week. He reports that he does not use illicit drugs. family history includes Hypertension in his mother. No Known Allergies Current Outpatient Prescriptions on File Prior to Visit  Medication Sig Dispense Refill  . aspirin EC 81 MG EC tablet Take 1 tablet (81 mg total) by mouth daily.      . furosemide (LASIX) 40 MG tablet Take 1 tablet (40 mg total) by mouth daily as needed (for wieght gain of 2 pounds or more).  30 tablet  0  . insulin aspart (NOVOLOG) 100 UNIT/ML  injection Inject 20 Units into the skin 3 (three) times daily before meals.      . insulin glargine (LANTUS) 100 UNIT/ML injection Inject 40 Units into the skin at bedtime.      Marland Kitchen lisinopril-hydrochlorothiazide (PRINZIDE,ZESTORETIC) 20-12.5 MG per tablet Take 2 tablets by mouth daily.        . metFORMIN (GLUCOPHAGE) 500 MG tablet Take 500 mg by mouth 2 (two) times daily.        . metoprolol succinate (TOPROL XL) 25 MG 24 hr tablet Take 1 tablet (25 mg total) by mouth daily.  30 tablet  0  . Multiple Vitamin (MULTIVITAMIN) capsule Take 1 capsule by mouth daily.        Marland Kitchen omeprazole (PRILOSEC) 20 MG capsule Take 1 capsule (20 mg total) by mouth 2 (two) times daily.  180 capsule  3  . simvastatin (ZOCOR) 40 MG tablet Take 40 mg by mouth at bedtime.       . tadalafil (CIALIS) 5 MG tablet Take 5 mg by mouth daily.      . traZODone (DESYREL) 50 MG tablet Take 0.5-1 tablets (25-50 mg total) by mouth at bedtime as needed for sleep.  30 tablet  3  . zolpidem (AMBIEN) 10 MG tablet Take 10 mg by mouth at bedtime as needed. For sleep       No current facility-administered medications on file prior to visit.    Review of Systems  Constitutional:  Negative for unexpected weight change, or unusual diaphoresis  HENT: Negative for tinnitus.   Eyes: Negative for photophobia and visual disturbance.  Respiratory: Negative for choking and stridor.   Gastrointestinal: Negative for vomiting and blood in stool.  Genitourinary: Negative for hematuria and decreased urine volume.  Musculoskeletal: Negative for acute joint swelling Skin: Negative for color change and wound.  Neurological: Negative for tremors and numbness other than noted  Psychiatric/Behavioral: Negative for decreased concentration or  hyperactivity.       Objective:   Physical Exam BP 130/90  Pulse 95  Temp(Src) 98 F (36.7 C) (Oral)  Ht 5\' 6"  (1.676 m)  Wt 223 lb 6 oz (101.322 kg)  BMI 36.07 kg/m2  SpO2 96% VS noted,  Constitutional: Pt  appears well-developed and well-nourished.  HENT: Head: NCAT.  Right Ear: External ear normal.  Left Ear: External ear normal.  Eyes: Conjunctivae and EOM are normal. Pupils are equal, round, and reactive to light.  Neck: Normal range of motion. Neck supple.  Cardiovascular: Normal rate and regular rhythm.   Pulmonary/Chest: Effort normal and breath sounds normal.  Neurological: Pt is alert. Not confused  Skin: Skin is warm. No erythema. No LE edema Psychiatric: Pt behavior is normal. Thought content normal.     Assessment & Plan:

## 2013-01-02 NOTE — Patient Instructions (Signed)
Please continue all other medications as before, and refills have been done if requested. Please have the pharmacy call with any other refills you may need. Please go to the LAB in the Basement (turn left off the elevator) for the tests to be done today You will be contacted by phone if any changes need to be made immediately.  Otherwise, you will receive a letter about your results with an explanation  Please remember to sign up for My Chart if you have not done so, as this will be important to you in the future with finding out test results, communicating by private email, and scheduling acute appointments online when needed.  Please return in 3 months, or sooner if needed, with Lab testing done 3-5 days before

## 2013-01-06 ENCOUNTER — Encounter: Payer: Self-pay | Admitting: Internal Medicine

## 2013-01-06 ENCOUNTER — Other Ambulatory Visit: Payer: Self-pay | Admitting: Internal Medicine

## 2013-01-06 DIAGNOSIS — IMO0001 Reserved for inherently not codable concepts without codable children: Secondary | ICD-10-CM

## 2013-01-06 MED ORDER — ATORVASTATIN CALCIUM 40 MG PO TABS: 40.0000 mg | ORAL_TABLET | Freq: Every day | ORAL | Status: DC

## 2013-01-12 ENCOUNTER — Ambulatory Visit: Payer: Federal, State, Local not specified - PPO | Admitting: Internal Medicine

## 2013-01-12 DIAGNOSIS — Z0289 Encounter for other administrative examinations: Secondary | ICD-10-CM

## 2013-04-10 ENCOUNTER — Ambulatory Visit: Payer: Federal, State, Local not specified - PPO | Admitting: Internal Medicine

## 2013-04-10 DIAGNOSIS — Z0289 Encounter for other administrative examinations: Secondary | ICD-10-CM

## 2013-07-08 ENCOUNTER — Encounter: Payer: Self-pay | Admitting: Internal Medicine

## 2013-07-08 ENCOUNTER — Other Ambulatory Visit: Payer: Self-pay | Admitting: Internal Medicine

## 2013-07-08 ENCOUNTER — Ambulatory Visit (INDEPENDENT_AMBULATORY_CARE_PROVIDER_SITE_OTHER): Payer: Federal, State, Local not specified - PPO | Admitting: Internal Medicine

## 2013-07-08 VITALS — BP 130/89 | HR 97 | Temp 98.1°F | Wt 215.2 lb

## 2013-07-08 DIAGNOSIS — J029 Acute pharyngitis, unspecified: Secondary | ICD-10-CM

## 2013-07-08 DIAGNOSIS — I10 Essential (primary) hypertension: Secondary | ICD-10-CM

## 2013-07-08 DIAGNOSIS — E119 Type 2 diabetes mellitus without complications: Secondary | ICD-10-CM

## 2013-07-08 MED ORDER — FUROSEMIDE 40 MG PO TABS: 40.0000 mg | ORAL_TABLET | Freq: Every day | ORAL | Status: DC | PRN

## 2013-07-08 NOTE — Progress Notes (Signed)
  Subjective:    Patient ID: Hector Mahoney, male    DOB: Jan 02, 1971, 42 y.o.   MRN: [id number]  Sore Throat  This is a new problem. The current episode started yesterday. The problem has been gradually worsening. The maximum temperature recorded prior to his arrival was 100 - 100.9 F. The fever has been present for less than 1 day. The pain is mild. Associated symptoms include a plugged ear sensation. Pertinent negatives include no congestion, coughing, diarrhea, ear pain, hoarse voice, shortness of breath, swollen glands, trouble swallowing or vomiting. He has had no exposure to mono. Strep: ? He has tried cool liquids for the symptoms. The treatment provided mild relief.   Also reviewed chronic medical issues  Past Medical History  Diagnosis Date  . ABSCESS, TOOTH 04/23/2009  . Esophageal reflux 04/27/2009  . FATIGUE 04/27/2009  . HYPERLIPIDEMIA 04/24/2007  . HYPERTENSION 04/24/2007  . INSOMNIA-SLEEP DISORDER-UNSPEC 04/27/2009  . LUMBAR STRAIN, ACUTE 12/11/2008  . RASH-NONVESICULAR 03/11/2008  . Shortness of breath     "lying down and w/exertion right now" (12/22/2012)  . DIABETES MELLITUS, TYPE I 07/30/2007    Review of Systems  HENT: Negative for congestion, ear pain, hoarse voice and trouble swallowing.   Respiratory: Negative for cough and shortness of breath.   Gastrointestinal: Negative for vomiting and diarrhea.       Objective:   Physical Exam BP 130/89  Pulse 97  Temp(Src) 98.1 F (36.7 C) (Oral)  Wt 215 lb 4 oz (97.637 kg)  SpO2 97% Wt Readings from Last 3 Encounters:  07/08/13 215 lb 4 oz (97.637 kg)  01/02/13 223 lb 6 oz (101.322 kg)  12/23/12 215 lb 4.8 oz (97.659 kg)   Constitutional: he appears well-developed and well-nourished. No distress.  HENT: Head: Normocephalic and atraumatic. Ears: B TMs ok, no erythema or effusion; Nose: Nose normal. Mouth/Throat: Oropharynx is red but clear and moist. No oropharyngeal exudate.  Eyes: Conjunctivae and EOM are normal.  Pupils are equal, round, and reactive to light. No scleral icterus.  Neck: Normal range of motion. Neck supple. No JVD or LAD present. No thyromegaly present.  Cardiovascular: Normal rate, regular rhythm and normal heart sounds.  No murmur heard. No BLE edema. Pulmonary/Chest: Effort normal and breath sounds normal. No respiratory distress. he has no wheezes.  Skin: Skin is warm and dry. No rash noted. No erythema.  Psychiatric: he has a normal mood and affect. behavior is normal. Judgment and thought content normal.  Lab Results  Component Value Date   WBC 8.9 12/23/2012   HGB 13.8 12/23/2012   HCT 41.8 12/23/2012   PLT 280 12/23/2012   GLUCOSE 71 01/02/2013   CHOL 293* 01/02/2013   TRIG 316.0* 01/02/2013   HDL 59.60 01/02/2013   LDLDIRECT 176.4 01/02/2013   ALT 23 01/02/2013   AST 22 01/02/2013   NA 140 01/02/2013   K 3.5 01/02/2013   CL 103 01/02/2013   CREATININE 1.4 01/02/2013   BUN 22 01/02/2013   CO2 27 01/02/2013   TSH 2.553 12/22/2012   HGBA1C 11.4* 01/02/2013       Assessment & Plan:   acute pharyngitis. Suspect viral etiology. Rapid strep today negative. Advised on lack of efficacy for antibiotics and viral disease. Symptomatic treatment advised. Patient to call if symptoms worse or unimproved in next 72 hours  Also See problem list. Medications and labs reviewed today.

## 2013-07-08 NOTE — Patient Instructions (Addendum)
It was good to see you today.  Rapid strep test: negative in office today  If you develop worsening symptoms or fever, call and we can reconsider antibiotics, but it does not appear necessary to use antibiotics at this time.  Use salt water gargles, cool liquids and Tylenol or ibuprofen as needed for discomfort -  Work excuse note for today provided as requested. Please call if unimproved by Friday  Test(s) ordered today. Your results will be released to [place] (or called to you) after review, usually within 72hours after test completion. If any changes need to be made, you will be notified at that same time.  Medications reviewed and updated, no changes recommended at this time. Refill on medication(s) as discussed today.   Please schedule followup in 3-4 months with Dr Jenny Reichmann for diabetes mellitus check, call sooner if problems.  Viral and Bacterial Pharyngitis Pharyngitis is soreness (inflammation) or infection of the pharynx. It is also called a sore throat. CAUSES  Most sore throats are caused by viruses and are part of a cold. However, some sore throats are caused by strep and other bacteria. Sore throats can also be caused by post nasal drip from draining sinuses, allergies and sometimes from sleeping with an open mouth. Infectious sore throats can be spread from person to person by coughing, sneezing and sharing cups or eating utensils. TREATMENT  Sore throats that are viral usually last 3-4 days. Viral illness will get better without medications (antibiotics). Strep throat and other bacterial infections will usually begin to get better about 24-48 hours after you begin to take antibiotics. HOME CARE INSTRUCTIONS   If the caregiver feels there is a bacterial infection or if there is a positive strep test, they will prescribe an antibiotic. The full course of antibiotics must be taken. If the full course of antibiotic is not taken, you or your child may become ill again. If you or  your child has strep throat and do not finish all of the medication, serious heart or kidney diseases may develop.  Drink enough water and fluids to keep your urine clear or pale yellow.  Only take over-the-counter or prescription medicines for pain, discomfort or fever as directed by your caregiver.  Get lots of rest.  Gargle with salt water ( tsp. of salt in a glass of water) as often as every 1-2 hours as you need for comfort.  Hard candies may soothe the throat if individual is not at risk for choking. Throat sprays or lozenges may also be used. SEEK MEDICAL CARE IF:   Large, tender lumps in the neck develop.  A rash develops.  Green, yellow-brown or bloody sputum is coughed up.  Your baby is older than 3 months with a rectal temperature of 100.5 F (38.1 C) or higher for more than 1 day. SEEK IMMEDIATE MEDICAL CARE IF:   A stiff neck develops.  You or your child are drooling or unable to swallow liquids.  You or your child are vomiting, unable to keep medications or liquids down.  You or your child has severe pain, unrelieved with recommended medications.  You or your child are having difficulty breathing (not due to stuffy nose).  You or your child are unable to fully open your mouth.  You or your child develop redness, swelling, or severe pain anywhere on the neck.  You have a fever.  Your baby is older than 3 months with a rectal temperature of 102 F (38.9 C) or higher.  Your  baby is 63 months old or younger with a rectal temperature of 100.4 F (38 C) or higher. MAKE SURE YOU:   Understand these instructions.  Will watch your condition.  Will get help right away if you are not doing well or get worse. Document Released: 07/30/2005 Document Revised: 10/22/2011 Document Reviewed: 10/27/2007 [facility] Patient Information 2014 [place], [place]. Diabetes and Standards of Medical Care  Diabetes is complicated. You may find that your diabetes team includes a  dietitian, nurse, diabetes educator, eye doctor, and more. To help everyone know what is going on and to help you get the care you deserve, the following schedule of care was developed to help keep you on track. Below are the tests, exams, vaccines, medicines, education, and plans you will need. HbA1c test This test shows how well you have controlled your glucose over the past 2 3 months. It is used to see if your diabetes management plan needs to be adjusted.   It is performed at least 2 times a year if you are meeting treatment goals.  It is performed 4 times a year if therapy has changed or if you are not meeting treatment goals. Blood pressure test  This test is performed at every routine medical visit. The goal is less than 140/90 mmHg for most people, but 130/80 mmHg in some cases. Ask your health care provider about your goal. Dental exam  Follow up with the dentist regularly. Eye exam  If you are diagnosed with type 1 diabetes as a child, get an exam upon reaching the age of 36 years or older and have had diabetes for 3 5 years. Yearly eye exams are recommended after that initial eye exam.  If you are diagnosed with type 1 diabetes as an adult, get an exam within 5 years of diagnosis and then yearly.  If you are diagnosed with type 2 diabetes, get an exam as soon as possible after the diagnosis and then yearly. Foot care exam  Visual foot exams are performed at every routine medical visit. The exams check for cuts, injuries, or other problems with the feet.  A comprehensive foot exam should be done yearly. This includes visual inspection as well as assessing foot pulses and testing for loss of sensation.  Check your feet nightly for cuts, injuries, or other problems with your feet. Tell your health care provider if anything is not healing. Kidney function test (urine microalbumin)  This test is performed once a year.  Type 1 diabetes: The first test is performed 5 years after  diagnosis.  Type 2 diabetes: The first test is performed at the time of diagnosis.  A serum creatinine and estimated glomerular filtration rate (eGFR) test is done once a year to assess the level of chronic kidney disease (CKD), if present. Lipid profile (cholesterol, HDL, LDL, triglycerides)  Performed every 5 years for most people.  The goal for LDL is less than 100 mg/dL. If you are at high risk, the goal is less than 70 mg/dL.  The goal for HDL is 40 mg/dL 50 mg/dL for men and 50 mg/dL 60 mg/dL for women. An HDL cholesterol of 60 mg/dL or higher gives some protection against heart disease.  The goal for triglycerides is less than 150 mg/dL. Influenza vaccine, pneumococcal vaccine, and hepatitis B vaccine  The influenza vaccine is recommended yearly.  The pneumococcal vaccine is generally given once in a lifetime. However, there are some instances when another vaccination is recommended. Check with your health care  provider.  The hepatitis B vaccine is also recommended for adults with diabetes. Diabetes self-management education  Education is recommended at diagnosis and ongoing as needed. Treatment plan  Your treatment plan is reviewed at every medical visit. Document Released: 05/27/2009 Document Revised: 04/01/2013 Document Reviewed: 12/30/2012 [facility] Patient Information 2014 Register.

## 2013-07-08 NOTE — Assessment & Plan Note (Signed)
Lab Results  Component Value Date   HGBA1C 11.4* 01/02/2013   Reports home CBGs doing well Check A1c today to confirm

## 2013-07-08 NOTE — Progress Notes (Signed)
Pre visit review using our clinic review tool, if applicable. No additional management support is needed unless otherwise documented below in the visit note. 

## 2013-07-08 NOTE — Assessment & Plan Note (Signed)
  BP Readings from Last 3 Encounters:  07/08/13 130/89  01/02/13 130/90  12/23/12 132/88   The current medical regimen is effective;  continue present plan and medications.

## 2013-07-10 ENCOUNTER — Telehealth: Payer: Self-pay

## 2013-07-10 MED ORDER — AZITHROMYCIN 250 MG PO TABS: ORAL_TABLET | ORAL | Status: DC

## 2013-07-10 NOTE — Telephone Encounter (Signed)
Returned call to patient//lmovm to check pharmacy.

## 2013-07-10 NOTE — Telephone Encounter (Signed)
zpak - erx done

## 2013-07-10 NOTE — Telephone Encounter (Signed)
Patient called Hector Mahoney c/o continued sore throat. States that he was seen in the office 07/08/13 and advised to call back should there be no improvement. Pt is requesting MD advisement, Thanks

## 2014-03-26 ENCOUNTER — Telehealth: Payer: Self-pay | Admitting: Internal Medicine

## 2014-03-26 MED ORDER — INSULIN GLARGINE 100 UNIT/ML ~~LOC~~ SOLN: 40.0000 [IU] | Freq: Every day | SUBCUTANEOUS | Status: DC

## 2014-03-26 MED ORDER — INSULIN ASPART 100 UNIT/ML ~~LOC~~ SOLN: 20.0000 [IU] | Freq: Three times a day (TID) | SUBCUTANEOUS | Status: DC

## 2014-03-26 NOTE — Telephone Encounter (Signed)
Pt came by to request Rx for insulin. Pt states he is currently completely out of his insulin. [facility] but [facility] are requesting [facility]. Pt wants to know if Dr Jenny Reichmann can provide him with the Rx in paper form for pick up. Please contact pt when request is reviewed.

## 2014-03-26 NOTE — Telephone Encounter (Signed)
rx printed, pt notified

## 2014-03-29 ENCOUNTER — Emergency Department (HOSPITAL_COMMUNITY)
Admission: EM | Admit: 2014-03-29 | Discharge: 2014-03-29 | Disposition: A | Discharge status: Home / Self Care | Payer: Federal, State, Local not specified - PPO | Attending: Emergency Medicine | Admitting: Emergency Medicine

## 2014-03-29 ENCOUNTER — Emergency Department (HOSPITAL_COMMUNITY): Payer: Federal, State, Local not specified - PPO

## 2014-03-29 DIAGNOSIS — R0602 Shortness of breath: Secondary | ICD-10-CM | POA: Diagnosis present

## 2014-03-29 DIAGNOSIS — K219 Gastro-esophageal reflux disease without esophagitis: Secondary | ICD-10-CM | POA: Insufficient documentation

## 2014-03-29 DIAGNOSIS — Z79899 Other long term (current) drug therapy: Secondary | ICD-10-CM | POA: Insufficient documentation

## 2014-03-29 DIAGNOSIS — R0789 Other chest pain: Secondary | ICD-10-CM | POA: Insufficient documentation

## 2014-03-29 DIAGNOSIS — E109 Type 1 diabetes mellitus without complications: Secondary | ICD-10-CM | POA: Insufficient documentation

## 2014-03-29 DIAGNOSIS — E785 Hyperlipidemia, unspecified: Secondary | ICD-10-CM | POA: Insufficient documentation

## 2014-03-29 DIAGNOSIS — Z87891 Personal history of nicotine dependence: Secondary | ICD-10-CM | POA: Insufficient documentation

## 2014-03-29 DIAGNOSIS — R05 Cough: Secondary | ICD-10-CM | POA: Insufficient documentation

## 2014-03-29 DIAGNOSIS — G4700 Insomnia, unspecified: Secondary | ICD-10-CM | POA: Diagnosis not present

## 2014-03-29 DIAGNOSIS — Z794 Long term (current) use of insulin: Secondary | ICD-10-CM | POA: Insufficient documentation

## 2014-03-29 DIAGNOSIS — I5030 Unspecified diastolic (congestive) heart failure: Secondary | ICD-10-CM | POA: Diagnosis not present

## 2014-03-29 DIAGNOSIS — R059 Cough, unspecified: Secondary | ICD-10-CM | POA: Diagnosis not present

## 2014-03-29 DIAGNOSIS — R000 Tachycardia, unspecified: Secondary | ICD-10-CM | POA: Insufficient documentation

## 2014-03-29 DIAGNOSIS — Z87828 Personal history of other (healed) physical injury and trauma: Secondary | ICD-10-CM | POA: Insufficient documentation

## 2014-03-29 DIAGNOSIS — I10 Essential (primary) hypertension: Secondary | ICD-10-CM | POA: Diagnosis not present

## 2014-03-29 DIAGNOSIS — Z7982 Long term (current) use of aspirin: Secondary | ICD-10-CM | POA: Diagnosis not present

## 2014-03-29 LAB — CBC
HCT: 44.1 % (ref 39.0–52.0)
HEMOGLOBIN: 14.7 g/dL (ref 13.0–17.0)
MCH: 25.9 pg — ABNORMAL LOW (ref 26.0–34.0)
MCHC: 33.3 g/dL (ref 30.0–36.0)
MCV: 77.6 fL — ABNORMAL LOW (ref 78.0–100.0)
PLATELETS: 267 10*3/uL (ref 150–400)
RBC: 5.68 MIL/uL (ref 4.22–5.81)
RDW: 13.9 % (ref 11.5–15.5)
WBC: 14.4 10*3/uL — AB (ref 4.0–10.5)

## 2014-03-29 LAB — URINALYSIS, ROUTINE W REFLEX MICROSCOPIC
Bilirubin Urine: NEGATIVE
GLUCOSE, UA: 500 mg/dL — AB
Ketones, ur: 15 mg/dL — AB
LEUKOCYTES UA: NEGATIVE
Nitrite: NEGATIVE
PH: 5 (ref 5.0–8.0)
Specific Gravity, Urine: 1.02 (ref 1.005–1.030)
Urobilinogen, UA: 0.2 mg/dL (ref 0.0–1.0)

## 2014-03-29 LAB — URINE MICROSCOPIC-ADD ON

## 2014-03-29 LAB — COMPREHENSIVE METABOLIC PANEL
ALK PHOS: 88 U/L (ref 39–117)
ALT: 21 U/L (ref 0–53)
AST: 26 U/L (ref 0–37)
Albumin: 3.4 g/dL — ABNORMAL LOW (ref 3.5–5.2)
Anion gap: 18 — ABNORMAL HIGH (ref 5–15)
BILIRUBIN TOTAL: 0.5 mg/dL (ref 0.3–1.2)
BUN: 22 mg/dL (ref 6–23)
CALCIUM: 9.8 mg/dL (ref 8.4–10.5)
CHLORIDE: 102 meq/L (ref 96–112)
CO2: 21 meq/L (ref 19–32)
Creatinine, Ser: 1.5 mg/dL — ABNORMAL HIGH (ref 0.50–1.35)
GFR, EST AFRICAN AMERICAN: 65 mL/min — AB (ref >90)
GFR, EST NON AFRICAN AMERICAN: 56 mL/min — AB (ref >90)
GLUCOSE: 249 mg/dL — AB (ref 70–99)
POTASSIUM: 4 meq/L (ref 3.7–5.3)
SODIUM: 141 meq/L (ref 137–147)
Total Protein: 7.7 g/dL (ref 6.0–8.3)

## 2014-03-29 LAB — TROPONIN I: Troponin I: <0.3 ng/mL (ref <0.30)

## 2014-03-29 LAB — I-STAT TROPONIN, ED: Troponin i, poc: 0.05 ng/mL (ref 0.00–0.08)

## 2014-03-29 LAB — PRO B NATRIURETIC PEPTIDE: Pro B Natriuretic peptide (BNP): 506.1 pg/mL — ABNORMAL HIGH (ref 0–125)

## 2014-03-29 MED ORDER — LISINOPRIL 20 MG PO TABS
20.0000 mg | ORAL_TABLET | Freq: Every day | ORAL | Status: DC
  Administered 2014-03-29: 20 mg via ORAL
  Filled 2014-03-29: qty 1

## 2014-03-29 MED ORDER — HYDROCHLOROTHIAZIDE 12.5 MG PO CAPS
12.5000 mg | ORAL_CAPSULE | Freq: Every day | ORAL | Status: DC
  Administered 2014-03-29: 12.5 mg via ORAL
  Filled 2014-03-29: qty 1

## 2014-03-29 MED ORDER — LISINOPRIL-HYDROCHLOROTHIAZIDE 20-12.5 MG PO TABS: 1.0000 | ORAL_TABLET | Freq: Every day | ORAL | Status: DC

## 2014-03-29 MED ORDER — SODIUM CHLORIDE 0.9 % IV BOLUS (SEPSIS)
500.0000 mL | Freq: Once | INTRAVENOUS | Status: AC
  Administered 2014-03-29: 500 mL via INTRAVENOUS

## 2014-03-29 NOTE — ED Notes (Addendum)
States cannot walk w/ getting sob, has had this happen ,before makes him lightheaded pressure in his chest has been out of bp meds x 1 week

## 2014-03-29 NOTE — Discharge Instructions (Signed)
Shortness of Breath °Shortness of breath means you have trouble breathing. Shortness of breath needs medical care right away. °HOME CARE  °· Do not smoke. °· Avoid being around chemicals or things (paint fumes, dust) that may bother your breathing. °· Rest as needed. Slowly begin your normal activities. °· Only take medicines as told by your doctor. °· Keep all doctor visits as told. °GET HELP RIGHT AWAY IF:  °· Your shortness of breath gets worse. °· You feel lightheaded, pass out (faint), or have a cough that is not helped by medicine. °· You cough up blood. °· You have pain with breathing. °· You have pain in your chest, arms, shoulders, or belly (abdomen). °· You have a fever. °· You cannot walk up stairs or exercise the way you normally do. °· You do not get better in the time expected. °· You have a hard time doing normal activities even with rest. °· You have problems with your medicines. °· You have any new symptoms. °MAKE SURE YOU: °· Understand these instructions. °· Will watch your condition. °· Will get help right away if you are not doing well or get worse. °Document Released: 01/16/2008 Document Revised: 08/04/2013 Document Reviewed: 10/15/2011 °[employer]® Patient Information ©2015 [employer]. This information is not intended to replace advice given to you by your health care provider. Make sure you discuss any questions you have with your health care provider. ° °

## 2014-03-29 NOTE — ED Provider Notes (Signed)
CSN: [id number]     Arrival date & time 03/29/14  1439 History   First MD Initiated Contact with Patient 03/29/14 1536     Chief Complaint  Patient presents with  . Shortness of Breath     (Consider location/radiation/quality/duration/timing/severity/associated sxs/prior Treatment) Patient is a 43 y.o. male presenting with shortness of breath. The history is provided by the patient.  Shortness of Breath Severity:  Mild Onset quality:  Gradual Duration:  1 week Timing:  Constant Progression:  Worsening Chronicity:  Recurrent Context: activity   Context comment:  When supine Relieved by:  Nothing Worsened by:  Exertion Ineffective treatments:  None tried Associated symptoms: cough (dry)   Associated symptoms: no abdominal pain, no chest pain, no fever, no headaches, no neck pain and no vomiting     Past Medical History  Diagnosis Date  . ABSCESS, TOOTH 04/23/2009  . Esophageal reflux 04/27/2009  . FATIGUE 04/27/2009  . HYPERLIPIDEMIA 04/24/2007  . HYPERTENSION 04/24/2007  . INSOMNIA-SLEEP DISORDER-UNSPEC 04/27/2009  . LUMBAR STRAIN, ACUTE 12/11/2008  . RASH-NONVESICULAR 03/11/2008  . Shortness of breath     "lying down and w/exertion right now" (12/22/2012)  . DIABETES MELLITUS, TYPE I 07/30/2007   Past Surgical History  Procedure Laterality Date  . No past surgeries     Family History  Problem Relation Age of Onset  . Hypertension Mother    History  Substance Use Topics  . Smoking status: Former Smoker    Types: Cigarettes  . Smokeless tobacco: Never Used     Comment: 12/22/2012 "used to smoke cigarettes once or twice/month; haven't had any cigarettes for years"  . Alcohol Use: 1.2 oz/week    1 Cans of beer, 1 Shots of liquor per week     Comment: 12/22/2012 "once mixed or one beer 2 times/week"    Review of Systems  Constitutional: Negative for fever.  HENT: Negative for drooling and rhinorrhea.   Eyes: Negative for pain.  Respiratory: Positive for cough (dry),  chest tightness and shortness of breath.   Cardiovascular: Negative for chest pain and leg swelling.  Gastrointestinal: Negative for nausea, vomiting, abdominal pain and diarrhea.  Genitourinary: Negative for dysuria and hematuria.  Musculoskeletal: Negative for gait problem and neck pain.  Skin: Negative for color change.  Neurological: Negative for numbness and headaches.  Hematological: Negative for adenopathy.  Psychiatric/Behavioral: Negative for behavioral problems.  All other systems reviewed and are negative.     Allergies  Review of patient's allergies indicates no known allergies.  Home Medications   Prior to Admission medications   Medication Sig Start Date End Date Taking? Authorizing Provider  aspirin EC 81 MG EC tablet Take 1 tablet (81 mg total) by mouth daily. 12/23/12   Sorin June Leap, MD  atorvastatin (LIPITOR) 40 MG tablet Take 1 tablet (40 mg total) by mouth daily. 01/06/13   Biagio Borg, MD  azithromycin (ZITHROMAX Z-PAK) 250 MG tablet Take 2 tablets (500 mg) on  Day 1,  followed by 1 tablet (250 mg) once daily on Days 2 through 5. 07/10/13   Rowe Clack, MD  furosemide (LASIX) 40 MG tablet Take 1 tablet (40 mg total) by mouth daily as needed (for wieght gain of 2 pounds or more). 07/08/13   Rowe Clack, MD  insulin aspart (NOVOLOG) 100 UNIT/ML injection Inject 20 Units into the skin 3 (three) times daily before meals. 03/26/14   Biagio Borg, MD  insulin glargine (LANTUS) 100 UNIT/ML injection Inject 0.4 mLs (  40 Units total) into the skin at bedtime. 03/26/14   Biagio Borg, MD  lisinopril-hydrochlorothiazide (PRINZIDE,ZESTORETIC) 20-12.5 MG per tablet Take 2 tablets by mouth daily.      Historical Provider, MD  metFORMIN (GLUCOPHAGE) 500 MG tablet Take 500 mg by mouth 2 (two) times daily.      Historical Provider, MD  metoprolol succinate (TOPROL XL) 25 MG 24 hr tablet Take 1 tablet (25 mg total) by mouth daily. 12/23/12   Sorin June Leap, MD  Multiple  Vitamin (MULTIVITAMIN) capsule Take 1 capsule by mouth daily.      Historical Provider, MD  omeprazole (PRILOSEC) 20 MG capsule TAKE ONE CAPSULE BY MOUTH TWICE DAILY 07/08/13   Biagio Borg, MD  tadalafil (CIALIS) 5 MG tablet Take 5 mg by mouth daily.    Historical Provider, MD  traZODone (DESYREL) 50 MG tablet Take 0.5-1 tablets (25-50 mg total) by mouth at bedtime as needed for sleep. 05/02/12   Biagio Borg, MD  zolpidem (AMBIEN) 10 MG tablet Take 10 mg by mouth at bedtime as needed. For sleep    Historical Provider, MD   BP 195/112  Pulse 119  Resp 20  SpO2 99% Physical Exam  Nursing note and vitals reviewed. Constitutional: He is oriented to person, place, and time. He appears well-developed and well-nourished.  HENT:  Head: Normocephalic and atraumatic.  Right Ear: External ear normal.  Left Ear: External ear normal.  Nose: Nose normal.  Mouth/Throat: Oropharynx is clear and moist. No oropharyngeal exudate.  Eyes: Conjunctivae and EOM are normal. Pupils are equal, round, and reactive to light.  Neck: Normal range of motion. Neck supple.  Cardiovascular: Regular rhythm, normal heart sounds and intact distal pulses.  Exam reveals no gallop and no friction rub.   No murmur heard. HR 115  Pulmonary/Chest: Effort normal and breath sounds normal. No respiratory distress. He has no wheezes.  Abdominal: Soft. Bowel sounds are normal. He exhibits no distension. There is no tenderness. There is no rebound and no guarding.  Musculoskeletal: Normal range of motion. He exhibits no edema and no tenderness.  Neurological: He is alert and oriented to person, place, and time.  Skin: Skin is warm and dry.  Psychiatric: He has a normal mood and affect. His behavior is normal.    ED Course  Procedures (including critical care time) Labs Review Labs Reviewed  CBC - Abnormal; Notable for the following:    WBC 14.4 (*)    MCV 77.6 (*)    MCH 25.9 (*)    All other components within normal limits   COMPREHENSIVE METABOLIC PANEL - Abnormal; Notable for the following:    Glucose, Bld 249 (*)    Creatinine, Ser 1.50 (*)    Albumin 3.4 (*)    GFR calc non Af Amer 56 (*)    GFR calc Af Amer 65 (*)    Anion gap 18 (*)    All other components within normal limits  PRO B NATRIURETIC PEPTIDE - Abnormal; Notable for the following:    Pro B Natriuretic peptide (BNP) 506.1 (*)    All other components within normal limits  URINALYSIS, ROUTINE W REFLEX MICROSCOPIC - Abnormal; Notable for the following:    Glucose, UA 500 (*)    Hgb urine dipstick MODERATE (*)    Ketones, ur 15 (*)    Protein, ur >300 (*)    All other components within normal limits  URINE MICROSCOPIC-ADD ON - Abnormal; Notable for the following:  Squamous Epithelial / LPF FEW (*)    Casts GRANULAR CAST (*)    All other components within normal limits  TROPONIN I  Randolm Idol, ED    Imaging Review Dg Chest 2 View  03/29/2014   CLINICAL DATA:  Short of breath.  EXAM: CHEST  2 VIEW  COMPARISON:  12/22/2012.  FINDINGS: Cardiopericardial silhouette within normal limits. Mediastinal contours normal. Trachea midline. No airspace disease or effusion.  IMPRESSION: No active cardiopulmonary disease.   Electronically Signed   By: Dereck Ligas M.D.   On: 03/29/2014 16:18     EKG Interpretation   Date/Time:  Monday March 29 2014 15:12:13 EDT Ventricular Rate:  109 PR Interval:  170 QRS Duration: 86 QT Interval:  362 QTC Calculation: 487 R Axis:   -67 Text Interpretation:  Sinus tachycardia Possible Left atrial enlargement  Left axis deviation Pulmonary disease pattern T wave abnormality, consider  lateral ischemia Abnormal ECG artifact noted Confirmed by Christy Gentles  MD,  Elenore Rota ([id number]) on 03/29/2014 3:15:46 PM      MDM   Final diagnoses:  SOB (shortness of breath)    4:00 PM 43 y.o. male w hx of DM, HTN, HLP, diastolic CHF who was admitted approximately one year ago for new onset CHF exacerbation who  presents with gradually worsening shortness of breath over the last week. Pt still on his meds (except diuretic), has not followed up w/ a pcp.  He also endorses some mild chest tightness which began last night and has been persistent and unwavering since that time. Sob worse w/ exertion and when supine. He has no appreciable increased work breathing on exam. He is afebrile, hypertensive, and tachycardic here. Will get screening labs and imaging.  Pt did not take is BP med this morning. Will give him this med.    I interpreted/reviewed the labs and/or imaging which were non-contributory.  Cr stable w/ baseline. Delta trop neg. BP has decreased some w/ his po meds. Pt continues to appear well.  He follows at the [place]. His father follows w/ Dr. Terrence Dupont for cards. He would like to see him as well. Will provide referral. I have discussed the diagnosis/risks/treatment options with the patient and believe the pt to be eligible for discharge home to follow-up with Dr. Terrence Dupont, call to schedule appt. We also discussed returning to the ED immediately if new or worsening sx occur. We discussed the sx which are most concerning (e.g., worsening cp, worsening sob, edema) that necessitate immediate return. Medications administered to the patient during their visit and any new prescriptions provided to the patient are listed below.    Pamella Pert, MD 03/30/14 1311

## 2014-03-29 NOTE — ED Notes (Signed)
MD at bedside. 

## 2015-02-28 ENCOUNTER — Telehealth: Payer: Self-pay | Admitting: Internal Medicine

## 2015-02-28 NOTE — Telephone Encounter (Signed)
Patient has scheduled cpe for 8/2.  He is requesting enough furosemide to be sent to [facility] at [facility].

## 2015-03-01 MED ORDER — FUROSEMIDE 40 MG PO TABS: 40.0000 mg | ORAL_TABLET | Freq: Every day | ORAL | Status: DC | PRN

## 2015-03-15 ENCOUNTER — Encounter: Payer: Self-pay | Admitting: Internal Medicine

## 2015-03-15 ENCOUNTER — Ambulatory Visit (INDEPENDENT_AMBULATORY_CARE_PROVIDER_SITE_OTHER): Payer: Federal, State, Local not specified - PPO | Admitting: Internal Medicine

## 2015-03-15 VITALS — BP 162/110 | HR 93 | Temp 98.2°F | Ht 66.0 in | Wt 201.0 lb

## 2015-03-15 DIAGNOSIS — I5030 Unspecified diastolic (congestive) heart failure: Secondary | ICD-10-CM

## 2015-03-15 DIAGNOSIS — E119 Type 2 diabetes mellitus without complications: Secondary | ICD-10-CM

## 2015-03-15 DIAGNOSIS — Z0000 Encounter for general adult medical examination without abnormal findings: Secondary | ICD-10-CM | POA: Diagnosis not present

## 2015-03-15 MED ORDER — OMEPRAZOLE 20 MG PO CPDR: 20.0000 mg | DELAYED_RELEASE_CAPSULE | Freq: Two times a day (BID) | ORAL | Status: DC

## 2015-03-15 MED ORDER — FUROSEMIDE 40 MG PO TABS: 40.0000 mg | ORAL_TABLET | Freq: Every day | ORAL | Status: DC | PRN

## 2015-03-15 NOTE — Assessment & Plan Note (Signed)

## 2015-03-15 NOTE — Progress Notes (Signed)
Subjective:    Patient ID: Hector Mahoney, male    DOB: [birth year], 44 y.o.   MRN: [id number]  HPI  Here for wellness and f/u;  Overall doing ok;  Pt denies Chest pain, worsening SOB, DOE, wheezing, orthopnea, PND, worsening LE edema, palpitations, dizziness or syncope.  Pt denies neurological change such as new headache, facial or extremity weakness.  Pt denies polydipsia, polyuria, or low sugar symptoms. Pt states overall good compliance with treatment and medications, good tolerability, and has been trying to follow appropriate diet.  Pt denies worsening depressive symptoms, suicidal ideation or panic. No fever, night sweats, wt loss, loss of appetite, or other constitutional symptoms.  Pt states good ability with ADL's, has low fall risk, home safety reviewed and adequate, no other significant changes in hearing or vision, and only occasionally active with exercise. Declines immunizations.  Has dx of diast CHF listed, asks for card referral.  Asks for lasix refill.  Sees endo for DM, recent sugars improved to 100's per pt. Feels like he has more energy Past Medical History  Diagnosis Date  . ABSCESS, TOOTH 04/23/2009  . Esophageal reflux 04/27/2009  . FATIGUE 04/27/2009  . HYPERLIPIDEMIA 04/24/2007  . HYPERTENSION 04/24/2007  . INSOMNIA-SLEEP DISORDER-UNSPEC 04/27/2009  . LUMBAR STRAIN, ACUTE 12/11/2008  . RASH-NONVESICULAR 03/11/2008  . Shortness of breath     "lying down and w/exertion right now" (12/22/2012)  . DIABETES MELLITUS, TYPE I 07/30/2007   Past Surgical History  Procedure Laterality Date  . No past surgeries      reports that he has quit smoking. His smoking use included Cigarettes. He has never used smokeless tobacco. He reports that he drinks about 1.2 oz of alcohol per week. He reports that he does not use illicit drugs. family history includes Hypertension in his mother. No Known Allergies Current Outpatient Prescriptions on File Prior to Visit  Medication Sig Dispense  Refill  . aspirin EC 81 MG EC tablet Take 1 tablet (81 mg total) by mouth daily.    Marland Kitchen atorvastatin (LIPITOR) 40 MG tablet Take 40 mg by mouth daily at 6 PM.    . lisinopril-hydrochlorothiazide (PRINZIDE,ZESTORETIC) 20-12.5 MG per tablet Take 2 tablets by mouth daily.      . metFORMIN (GLUCOPHAGE) 500 MG tablet Take 500 mg by mouth 2 (two) times daily.      . metoprolol succinate (TOPROL XL) 25 MG 24 hr tablet Take 1 tablet (25 mg total) by mouth daily. 30 tablet 0   No current facility-administered medications on file prior to visit.   Review of Systems Constitutional: Negative for increased diaphoresis, other activity, appetite or siginficant weight change other than noted HENT: Negative for worsening hearing loss, ear pain, facial swelling, mouth sores and neck stiffness.   Eyes: Negative for other worsening pain, redness or visual disturbance.  Respiratory: Negative for shortness of breath and wheezing  Cardiovascular: Negative for chest pain and palpitations.  Gastrointestinal: Negative for diarrhea, blood in stool, abdominal distention or other pain Genitourinary: Negative for hematuria, flank pain or change in urine volume.  Musculoskeletal: Negative for myalgias or other joint complaints.  Skin: Negative for color change and wound or drainage.  Neurological: Negative for syncope and numbness. other than noted Hematological: Negative for adenopathy. or other swelling Psychiatric/Behavioral: Negative for hallucinations, SI, self-injury, decreased concentration or other worsening agitation.      Objective:   Physical Exam BP 162/110 mmHg  Pulse 93  Temp(Src) 98.2 F (36.8 C) (Oral)  Ht 5'  6" (1.676 m)  Wt 201 lb (91.173 kg)  BMI 32.46 kg/m2  SpO2 98% VS noted,  Constitutional: Pt is oriented to person, place, and time. Appears well-developed and well-nourished, in no significant distress Head: Normocephalic and atraumatic.  Right Ear: External ear normal.  Left Ear: External  ear normal.  Nose: Nose normal.  Mouth/Throat: Oropharynx is clear and moist.  Eyes: Conjunctivae and EOM are normal. Pupils are equal, round, and reactive to light.  Neck: Normal range of motion. Neck supple. No JVD present. No tracheal deviation present or significant neck LA or mass Cardiovascular: Normal rate, regular rhythm, normal heart sounds and intact distal pulses.   Pulmonary/Chest: Effort normal and breath sounds without rales or wheezing  Abdominal: Soft. Bowel sounds are normal. NT. No HSM  Musculoskeletal: Normal range of motion. Exhibits no edema.  Lymphadenopathy:  Has no cervical adenopathy.  Neurological: Pt is alert and oriented to person, place, and time. Pt has normal reflexes. No cranial nerve deficit. Motor grossly intact Skin: Skin is warm and dry. No rash noted.  Psychiatric:  Has normal mood and affect. Behavior is normal.   2014 echo -  [facility]*           [address], [place] [address]            [phone]  ------------------------------------------------------------ Transthoracic Echocardiography  Patient:  Hector Mahoney, Hector Mahoney MR #:    [id number] Study Date: 12/22/2012 Gender:   M Age:    46 Height:   167.6cm Weight:   94.8kg BSA:    2.68m^2 Pt. Status: Room:    [facility], [facility] ATTENDING  Marye Round, Sorin ADMITTING  Mason, [place], [place] SONOGRAPHER Lina Sar, RDCS cc:  ------------------------------------------------------------ LV EF: 50% -  55%  ------------------------------------------------------------ Indications:   CHF - 428.0.  ------------------------------------------------------------ History:  Risk factors: Hypertension. Diabetes mellitus. Dyslipidemia.  ------------------------------------------------------------ Study  Conclusions  - Left ventricle: The cavity size was normal. There was moderate concentric hypertrophy. Systolic function was normal. The estimated ejection fraction was in the range of 50% to 55%. There is mild hypokinesis of the entireanterior, lateral, inferior, and apical myocardium. There was a reduced contribution of atrial contraction to ventricular filling, due to increased ventricular diastolic pressure or atrial contractile dysfunction. Doppler parameters are consistent with a reversible restrictive pattern, indicative of decreased left ventricular diastolic compliance and/or increased left atrial pressure (grade 3 diastolic dysfunction). - Atrial septum: There was increased thickness of the septum, consistent with lipomatous hypertrophy. No defect or patent foramen ovale was identified. Transthoracic echocardiography. M-mode, complete 2D, spectral Doppler, and color Doppler. Height: Height: 167.6cm. Height: 66in. Weight: Weight: 94.8kg. Weight: 208.6lb. Body mass index: BMI: 33.7kg/m^2. Body surface area:  BSA: 2.57m^2. Blood pressure:   168/111. Patient status: Inpatient. Location: Echo laboratory.     Assessment & Plan:

## 2015-03-15 NOTE — Progress Notes (Signed)
Pre visit review using our clinic review tool, if applicable. No additional management support is needed unless otherwise documented below in the visit note. 

## 2015-03-15 NOTE — Assessment & Plan Note (Signed)
Ok for bnp, refer card, will hold on repeat echo for now

## 2015-03-15 NOTE — Assessment & Plan Note (Signed)
For a1c, cont f/u endo

## 2015-03-15 NOTE — Patient Instructions (Signed)
Please continue all other medications as before, and refills have been done if requested.  Please have the pharmacy call with any other refills you may need.  Please continue your efforts at being more active, low cholesterol diet, and weight control.  You are otherwise up to date with prevention measures today.  You will be contacted regarding the referral for: cardiology  Please keep your appointments with your specialists as you may have planned  Please go to the LAB in the Basement (turn left off the elevator) for the tests to be done today  You will be contacted by phone if any changes need to be made immediately.  Otherwise, you will receive a letter about your results with an explanation, but please check with MyChart first.  Please remember to sign up for MyChart if you have not done so, as this will be important to you in the future with finding out test results, communicating by private email, and scheduling acute appointments online when needed.  Please return in 1 year for your yearly visit, or sooner if needed

## 2015-03-18 ENCOUNTER — Emergency Department (HOSPITAL_COMMUNITY): Payer: Federal, State, Local not specified - PPO

## 2015-03-18 ENCOUNTER — Encounter (HOSPITAL_COMMUNITY): Payer: Self-pay | Admitting: *Deleted

## 2015-03-18 ENCOUNTER — Emergency Department (HOSPITAL_COMMUNITY)
Admission: EM | Admit: 2015-03-18 | Discharge: 2015-03-18 | Disposition: A | Discharge status: Home / Self Care | Payer: Federal, State, Local not specified - PPO | Attending: Emergency Medicine | Admitting: Emergency Medicine

## 2015-03-18 DIAGNOSIS — E785 Hyperlipidemia, unspecified: Secondary | ICD-10-CM | POA: Diagnosis not present

## 2015-03-18 DIAGNOSIS — E119 Type 2 diabetes mellitus without complications: Secondary | ICD-10-CM | POA: Diagnosis not present

## 2015-03-18 DIAGNOSIS — I5030 Unspecified diastolic (congestive) heart failure: Secondary | ICD-10-CM | POA: Insufficient documentation

## 2015-03-18 DIAGNOSIS — I10 Essential (primary) hypertension: Secondary | ICD-10-CM | POA: Diagnosis not present

## 2015-03-18 DIAGNOSIS — Z87891 Personal history of nicotine dependence: Secondary | ICD-10-CM | POA: Diagnosis not present

## 2015-03-18 DIAGNOSIS — K219 Gastro-esophageal reflux disease without esophagitis: Secondary | ICD-10-CM | POA: Insufficient documentation

## 2015-03-18 DIAGNOSIS — R0600 Dyspnea, unspecified: Secondary | ICD-10-CM | POA: Insufficient documentation

## 2015-03-18 DIAGNOSIS — Z79899 Other long term (current) drug therapy: Secondary | ICD-10-CM | POA: Insufficient documentation

## 2015-03-18 DIAGNOSIS — R0602 Shortness of breath: Secondary | ICD-10-CM | POA: Diagnosis present

## 2015-03-18 DIAGNOSIS — Z7982 Long term (current) use of aspirin: Secondary | ICD-10-CM | POA: Insufficient documentation

## 2015-03-18 HISTORY — DX: Heart failure, unspecified: I50.9

## 2015-03-18 LAB — CBC
HCT: 39.8 % (ref 39.0–52.0)
Hemoglobin: 13 g/dL (ref 13.0–17.0)
MCH: 25.4 pg — ABNORMAL LOW (ref 26.0–34.0)
MCHC: 32.7 g/dL (ref 30.0–36.0)
MCV: 77.9 fL — ABNORMAL LOW (ref 78.0–100.0)
PLATELETS: 246 10*3/uL (ref 150–400)
RBC: 5.11 MIL/uL (ref 4.22–5.81)
RDW: 14 % (ref 11.5–15.5)
WBC: 11.4 10*3/uL — ABNORMAL HIGH (ref 4.0–10.5)

## 2015-03-18 LAB — BASIC METABOLIC PANEL
ANION GAP: 10 (ref 5–15)
BUN: 17 mg/dL (ref 6–20)
CALCIUM: 9 mg/dL (ref 8.9–10.3)
CO2: 25 mmol/L (ref 22–32)
Chloride: 100 mmol/L — ABNORMAL LOW (ref 101–111)
Creatinine, Ser: 2 mg/dL — ABNORMAL HIGH (ref 0.61–1.24)
GFR calc Af Amer: 46 mL/min — ABNORMAL LOW (ref >60)
GFR calc non Af Amer: 39 mL/min — ABNORMAL LOW (ref >60)
Glucose, Bld: 254 mg/dL — ABNORMAL HIGH (ref 65–99)
Potassium: 3.8 mmol/L (ref 3.5–5.1)
Sodium: 135 mmol/L (ref 135–145)

## 2015-03-18 LAB — BRAIN NATRIURETIC PEPTIDE: B Natriuretic Peptide: 375.1 pg/mL — ABNORMAL HIGH (ref 0.0–100.0)

## 2015-03-18 LAB — I-STAT TROPONIN, ED
Troponin i, poc: 0.04 ng/mL (ref 0.00–0.08)
Troponin i, poc: 0.04 ng/mL (ref 0.00–0.08)

## 2015-03-18 NOTE — ED Notes (Signed)
MD at bedside. 

## 2015-03-18 NOTE — Discharge Instructions (Signed)

## 2015-03-18 NOTE — ED Notes (Signed)
Pt reports SOB/chest pressure that started yesterday while pt was at work. Pt states that it is made worse with exertion. Pt states that he went to pick up his lasix and did not take it yesterday. Pt states that he took his dose last night at 1am. Pt states that it had not started working his GF called EMS to evaluate. He did not come in. Pt states that he was continuing to feel bad today so he left work and was told the he would need a note for work.

## 2015-03-18 NOTE — ED Provider Notes (Signed)
CSN: [id number]     Arrival date & time 03/18/15  0704 History   First MD Initiated Contact with Patient 03/18/15 [phone]     Chief Complaint  Patient presents with  . Shortness of Breath     (Consider location/radiation/quality/duration/timing/severity/associated sxs/prior Treatment) Patient is a 44 y.o. male presenting with shortness of breath.  Shortness of Breath Severity:  Moderate Onset quality:  Gradual Duration:  2 days Timing:  Constant Progression:  Unchanged Chronicity:  New Context comment:  Noncompliant with lasix Relieved by:  Sitting up Exacerbated by: laying flat. Associated symptoms: no abdominal pain and no fever   Risk factors: no recent alcohol use and no hx of PE/DVT     Past Medical History  Diagnosis Date  . ABSCESS, TOOTH 04/23/2009  . Esophageal reflux 04/27/2009  . FATIGUE 04/27/2009  . HYPERLIPIDEMIA 04/24/2007  . HYPERTENSION 04/24/2007  . INSOMNIA-SLEEP DISORDER-UNSPEC 04/27/2009  . LUMBAR STRAIN, ACUTE 12/11/2008  . RASH-NONVESICULAR 03/11/2008  . Shortness of breath     "lying down and w/exertion right now" (12/22/2012)  . DIABETES MELLITUS, TYPE I 07/30/2007  . CHF (congestive heart failure)    Past Surgical History  Procedure Laterality Date  . No past surgeries     Family History  Problem Relation Age of Onset  . Hypertension Mother    History  Substance Use Topics  . Smoking status: Former Smoker    Types: Cigarettes  . Smokeless tobacco: Never Used     Comment: 12/22/2012 "used to smoke cigarettes once or twice/month; haven't had any cigarettes for years"  . Alcohol Use: 1.2 oz/week    1 Cans of beer, 1 Shots of liquor per week     Comment: 12/22/2012 "once mixed or one beer 2 times/week"    Review of Systems  Constitutional: Negative for fever.  Respiratory: Positive for shortness of breath.   Gastrointestinal: Negative for abdominal pain.  All other systems reviewed and are negative.     Allergies  Review of patient's  allergies indicates no known allergies.  Home Medications   Prior to Admission medications   Medication Sig Start Date End Date Taking? Authorizing Provider  aspirin EC 81 MG EC tablet Take 1 tablet (81 mg total) by mouth daily. 12/23/12  Yes Sorin June Leap, MD  atorvastatin (LIPITOR) 40 MG tablet Take 40 mg by mouth daily at 6 PM.   Yes Historical Provider, MD  furosemide (LASIX) 40 MG tablet Take 1 tablet (40 mg total) by mouth daily as needed (for wieght gain of 2 pounds or more). 03/15/15  Yes Biagio Borg, MD  insulin NPH-regular Human (NOVOLIN 70/30) (70-30) 100 UNIT/ML injection Inject 30-35 Units into the skin 2 (two) times daily with a meal. 35 units in the morning, 30 units in the evening   Yes Historical Provider, MD  lisinopril-hydrochlorothiazide (PRINZIDE,ZESTORETIC) 20-12.5 MG per tablet Take 2 tablets by mouth daily.     Yes Historical Provider, MD  metFORMIN (GLUCOPHAGE) 500 MG tablet Take 250 mg by mouth 2 (two) times daily.    Yes Historical Provider, MD  metoprolol succinate (TOPROL XL) 25 MG 24 hr tablet Take 1 tablet (25 mg total) by mouth daily. 12/23/12  Yes Sorin June Leap, MD  omeprazole (PRILOSEC) 20 MG capsule Take 1 capsule (20 mg total) by mouth 2 (two) times daily before a meal. 03/15/15  Yes Biagio Borg, MD   BP 156/102 mmHg  Pulse 99  Temp(Src) 98.5 F (36.9 C) (Oral)  Resp 20  Ht 5\' 6"  (1.676 m)  Wt 189 lb (85.73 kg)  BMI 30.52 kg/m2  SpO2 97% Physical Exam  Constitutional: He is oriented to person, place, and time. He appears well-developed and well-nourished.  HENT:  Head: Normocephalic and atraumatic.  Eyes: Conjunctivae and EOM are normal.  Neck: Normal range of motion. Neck supple.  Cardiovascular: Normal rate, regular rhythm and normal heart sounds.   Pulmonary/Chest: Effort normal and breath sounds normal. No respiratory distress.  Abdominal: He exhibits no distension. There is no tenderness. There is no rebound and no guarding.  Musculoskeletal: Normal  range of motion.       Right lower leg: He exhibits edema.       Left lower leg: He exhibits edema.  Neurological: He is alert and oriented to person, place, and time.  Skin: Skin is warm and dry.  Vitals reviewed.   ED Course  Procedures (including critical care time) Labs Review Labs Reviewed  BASIC METABOLIC PANEL - Abnormal; Notable for the following:    Chloride 100 (*)    Glucose, Bld 254 (*)    Creatinine, Ser 2.00 (*)    GFR calc non Af Amer 39 (*)    GFR calc Af Amer 46 (*)    All other components within normal limits  CBC - Abnormal; Notable for the following:    WBC 11.4 (*)    MCV 77.9 (*)    MCH 25.4 (*)    All other components within normal limits  BRAIN NATRIURETIC PEPTIDE - Abnormal; Notable for the following:    B Natriuretic Peptide 375.1 (*)    All other components within normal limits  I-STAT TROPOININ, ED  Randolm Idol, ED    Imaging Review Dg Chest 2 View  03/18/2015   CLINICAL DATA:  One day history of chest tightness. Intermittent cough.  EXAM: CHEST  2 VIEW  COMPARISON:  March 29, 2014  FINDINGS: Lungs are clear. Heart size and pulmonary vascularity are normal. No adenopathy. No pneumothorax. No bone lesions.  IMPRESSION: No edema or consolidation.   Electronically Signed   By: Lowella Grip III M.D.   On: 03/18/2015 08:00     EKG Interpretation   Date/Time:  Friday March 18 2015 07:10:37 EDT Ventricular Rate:  102 PR Interval:  195 QRS Duration: 84 QT Interval:  346 QTC Calculation: 451 R Axis:   -11 Text Interpretation:  Sinus tachycardia Probable left atrial enlargement  Probable LVH with secondary repol abnrm t wave inversions new in v5-6  Confirmed by Debby Freiberg [phone]) on 03/18/2015 7:48:32 AM      MDM   Final diagnoses:  Dyspnea  Diastolic congestive heart failure, unspecified congestive heart failure chronicity    44 y.o. male with pertinent PMH of dCHF (dx 2014) presents with recurrent dyspnea he states feels  identical.  On arrival vitals and physical exam as above.  Lungs clear, but with 1+ pitting edema to bil le.  No fevers.  Pt has had mild nonproductive cough.  He has been noncompliant with lasix over course of dyspnea.  Wu as above, including delta trop, unremarkable.  DC home to fu with cardiology.  Standard return precautions given.  I have reviewed all laboratory and imaging studies if ordered as above  1. Dyspnea   2. Diastolic congestive heart failure, unspecified congestive heart failure chronicity         Debby Freiberg, MD 03/18/15 1149

## 2015-04-13 ENCOUNTER — Encounter: Payer: Self-pay | Admitting: Cardiovascular Disease

## 2015-04-13 ENCOUNTER — Ambulatory Visit (INDEPENDENT_AMBULATORY_CARE_PROVIDER_SITE_OTHER): Payer: Federal, State, Local not specified - PPO | Admitting: Cardiovascular Disease

## 2015-04-13 VITALS — BP 176/112 | HR 94 | Ht 66.0 in | Wt 200.5 lb

## 2015-04-13 DIAGNOSIS — I5030 Unspecified diastolic (congestive) heart failure: Secondary | ICD-10-CM | POA: Diagnosis not present

## 2015-04-13 MED ORDER — FUROSEMIDE 40 MG PO TABS: 40.0000 mg | ORAL_TABLET | Freq: Every day | ORAL | Status: DC

## 2015-04-13 MED ORDER — CARVEDILOL 25 MG PO TABS: 25.0000 mg | ORAL_TABLET | Freq: Two times a day (BID) | ORAL | Status: DC

## 2015-04-13 NOTE — Progress Notes (Signed)
Cardiology Office Note   Date:  04/13/2015   ID:  Hector, Mahoney [birth year], MRN [id number]  PCP:  Cathlean Cower, MD  Cardiologist:   Sharol Harness, MD   Chief Complaint  Patient presents with  . New Evaluation    SOB every once in a while, on minimal exertion/tingling, numbness in toes when he lays down, happening for the past few weeks/ pt states no other Sx      History of Present Illness: Hector Mahoney is a 44 y.o. male with hypertension, hyperlipidemia, DM (A1c 11.4 01/02/13) who presents for follow up after being evaluated in the ED on 03/18/15 with acute on chronic diastolic heart failure.  [name] was diagnosed with grade 3 diastolic dysfunction in [id number].  Since that time he has not followed up with a cardiologist. He states that he only recently became serious about his heart failure and is ready to do everything he can to manage his disease. Since 2004 he's had several episodes of heart failure exacerbations that led him to the emergency department. Each episode he had chest tightness and felt as though he couldn't breathe. He endorsed orthopnea and PND. He never has lower extremity edema with these episodes. His main symptom is that he feels sluggish and fatigued.  On August 4 [name] was experiencing all these symptoms. He called EMS and says that he was feeling somewhat better by the time they arrived. He says that his vitals were stable. The EMS worker noted that his Lasix was being taken as needed rather than every day. He suggested that [name] began taking it daily. He did later follow up in the emergency department where troponin was negative and BNP was 325. He was discharged and instructed to follow up with cardiology.  [name] has had difficulty controlling his blood pressure. He's been working with his PCP, Dr. Jenny Reichmann, as well as his endocrinologist at the [facility]. He takes his medications as prescribed, but his blood pressure is  typically 160/80-90. He has not yet taken his morning medications.    Past Medical History  Diagnosis Date  . ABSCESS, TOOTH 04/23/2009  . Esophageal reflux 04/27/2009  . FATIGUE 04/27/2009  . HYPERLIPIDEMIA 04/24/2007  . HYPERTENSION 04/24/2007  . INSOMNIA-SLEEP DISORDER-UNSPEC 04/27/2009  . LUMBAR STRAIN, ACUTE 12/11/2008  . RASH-NONVESICULAR 03/11/2008  . Shortness of breath     "lying down and w/exertion right now" (12/22/2012)  . DIABETES MELLITUS, TYPE I 07/30/2007  . CHF (congestive heart failure)     Past Surgical History  Procedure Laterality Date  . No past surgeries       Current Outpatient Prescriptions  Medication Sig Dispense Refill  . aspirin EC 81 MG EC tablet Take 1 tablet (81 mg total) by mouth daily.    Marland Kitchen atorvastatin (LIPITOR) 40 MG tablet Take 40 mg by mouth daily at 6 PM.    . furosemide (LASIX) 40 MG tablet Take 1 tablet (40 mg total) by mouth daily. 35 tablet 11  . insulin NPH-regular Human (NOVOLIN 70/30) (70-30) 100 UNIT/ML injection Inject 30-35 Units into the skin 2 (two) times daily with a meal. 35 units in the morning, 30 units in the evening    . lisinopril-hydrochlorothiazide (PRINZIDE,ZESTORETIC) 20-12.5 MG per tablet Take 2 tablets by mouth daily.      . metFORMIN (GLUCOPHAGE) 500 MG tablet Take 250 mg by mouth 2 (two) times daily.     . metoprolol succinate (TOPROL XL) 25 MG  24 hr tablet Take 1 tablet (25 mg total) by mouth daily. 30 tablet 0  . omeprazole (PRILOSEC) 20 MG capsule Take 1 capsule (20 mg total) by mouth 2 (two) times daily before a meal. 180 capsule 1  . carvedilol (COREG) 25 MG tablet Take 1 tablet (25 mg total) by mouth 2 (two) times daily. 60 tablet 11   No current facility-administered medications for this visit.    Allergies:   Review of patient's allergies indicates no known allergies.    Social History:  The patient  reports that he has quit smoking. His smoking use included Cigarettes. He has never used smokeless tobacco. He  reports that he drinks about 1.2 oz of alcohol per week. He reports that he does not use illicit drugs.   Family History:  The patient's family history includes Hypertension in his mother.    ROS:  Please see the history of present illness.   Otherwise, review of systems are positive for none.   All other systems are reviewed and negative.    PHYSICAL EXAM: VS:  BP 176/112 mmHg  Pulse 94  Ht 5\' 6"  (1.676 m)  Wt 90.946 kg (200 lb 8 oz)  BMI 32.38 kg/m2 , BMI Body mass index is 32.38 kg/(m^2). GENERAL:  Well appearing HEENT:  Pupils equal round and reactive, fundi not visualized, oral mucosa unremarkable NECK:  JVP 2cm above clavicle at 45 degrees, waveform within normal limits, carotid upstroke brisk and symmetric, no bruits, no thyromegaly LYMPHATICS:  No cervical adenopathy LUNGS:  Clear to auscultation bilaterally HEART:  RRR.  PMI not displaced or sustained,S1 and S2 within normal limits, no S3, no S4, no clicks, no rubs, no murmurs ABD:  Flat, positive bowel sounds normal in frequency in pitch, no bruits, no rebound, no guarding, no midline pulsatile mass, no hepatomegaly, no splenomegaly EXT:  2 plus pulses throughout, no edema, no cyanosis no clubbing SKIN:  No rashes no nodules NEURO:  Cranial nerves II through XII grossly intact, motor grossly intact throughout PSYCH:  Cognitively intact, oriented to person place and time    EKG:  EKG is ordered today. The ekg ordered today demonstrates sinus rhythm at 94 bpm.  L axis deviation.  LVH with early repolarization.  TTE 12/22/12: LVEF 50-55%, moderate LVH.  Mild anterior, lateral, inferior and apical hypokinesis.  Grade 3 diastolic dysfunction.   Recent Labs: 03/18/2015: B Natriuretic Peptide 375.1*; BUN 17; Creatinine, Ser 2.00*; Hemoglobin 13.0; Platelets 246; Potassium 3.8; Sodium 135    Lipid Panel    Component Value Date/Time   CHOL 293* 01/02/2013 1627   TRIG 316.0* 01/02/2013 1627   HDL 59.60 01/02/2013 1627   CHOLHDL  5 01/02/2013 1627   VLDL 63.2* 01/02/2013 1627   LDLDIRECT 176.4 01/02/2013 1627      Wt Readings from Last 3 Encounters:  04/13/15 90.946 kg (200 lb 8 oz)  03/18/15 85.73 kg (189 lb)  03/15/15 91.173 kg (201 lb)      Other studies Reviewed: Additional studies/ records that were reviewed today include: . Review of the above records demonstrates:  Please see elsewhere in the note.     ASSESSMENT AND PLAN:  # Chronic diastolic heart failure: Mr. Louderback is slightly volume overloaded today. We agree that he should be taking Lasix daily instead of as needed.  He has been seen in the ED multiple times with heart failure exacerbations.  Therefore, we will repeat an echo to assess for any changes in systolic function. - check  echo - Improved BP control as below - Lasix 40mg  bid x3 days then 40mg  daily - Patient instructed on salt and fluid restriction as well as daily weight - Take an extra lasix 40mg  if weight increases by 2 lb in one day of 5 lb in one week.  # Hypertension: BP poorly-controlled.  He has not taken his BP meds yet today, but reports poorly-controlled BP even when he takes them. - Stop metoprolol - Start carvedilol 25mg  bid - Continue lisinopril 40mg  and hctz 25mg    # Hyperlipidemia: Agree with atorvastatin  # CV disease prevention: Continue aspirin 81mg  daily.   Current medicines are reviewed at length with the patient today.  The patient has concerns regarding medicines.  The following changes have been made:  Start carvedilol, stop metoprolol  Labs/ tests ordered today include:   Orders Placed This Encounter  Procedures  . EKG 12-Lead  . ECHOCARDIOGRAM COMPLETE     Disposition:   FU with Dr. Jonelle Sidle C. [place] in 6 months.    Signed, Sharol Harness, MD  04/13/2015 12:44 PM    [facility]

## 2015-04-13 NOTE — Patient Instructions (Signed)
Medication Instructions:  STOP Metoprolol START Carvedilol 25 mg - take 1 tablet by mouth TWICE daily. START taking Furosemide (Lasix) - take 40 mg (1 tablet total) by mouth TWICE DAILY FOR 3 DAYS then ONCE DAILY THEREAFTER.  Labwork: NONE  Testing/Procedures: Your physician has requested that you have an echocardiogram. Echocardiography is a painless test that uses sound waves to create images of your heart. It provides your doctor with information about the size and shape of your heart and how well your heart's chambers and valves are working. This procedure takes approximately one hour. There are no restrictions for this procedure.  Follow-Up: Dr Oval Linsey recommends that you schedule a follow-up appointment in 6 months. You will receive a reminder letter in the mail two months in advance. If you don't receive a letter, please call our office to schedule the follow-up appointment.

## 2015-04-26 ENCOUNTER — Other Ambulatory Visit (HOSPITAL_COMMUNITY): Payer: Federal, State, Local not specified - PPO

## 2015-07-22 ENCOUNTER — Ambulatory Visit (INDEPENDENT_AMBULATORY_CARE_PROVIDER_SITE_OTHER): Payer: Federal, State, Local not specified - PPO | Admitting: Internal Medicine

## 2015-07-22 ENCOUNTER — Encounter: Payer: Self-pay | Admitting: Internal Medicine

## 2015-07-22 VITALS — BP 150/106 | HR 90 | Temp 97.8°F | Ht 66.0 in | Wt 204.0 lb

## 2015-07-22 DIAGNOSIS — Z0189 Encounter for other specified special examinations: Secondary | ICD-10-CM

## 2015-07-22 DIAGNOSIS — E785 Hyperlipidemia, unspecified: Secondary | ICD-10-CM

## 2015-07-22 DIAGNOSIS — E1140 Type 2 diabetes mellitus with diabetic neuropathy, unspecified: Secondary | ICD-10-CM

## 2015-07-22 DIAGNOSIS — K219 Gastro-esophageal reflux disease without esophagitis: Secondary | ICD-10-CM | POA: Diagnosis not present

## 2015-07-22 DIAGNOSIS — Z0000 Encounter for general adult medical examination without abnormal findings: Secondary | ICD-10-CM

## 2015-07-22 DIAGNOSIS — I10 Essential (primary) hypertension: Secondary | ICD-10-CM | POA: Diagnosis not present

## 2015-07-22 MED ORDER — METOPROLOL SUCCINATE ER 50 MG PO TB24: 50.0000 mg | ORAL_TABLET | Freq: Every day | ORAL | Status: DC

## 2015-07-22 MED ORDER — OMEPRAZOLE 20 MG PO CPDR: 20.0000 mg | DELAYED_RELEASE_CAPSULE | Freq: Two times a day (BID) | ORAL | Status: DC

## 2015-07-22 NOTE — Progress Notes (Signed)
Subjective:    Patient ID: Hector Mahoney, male    DOB: 1971/02/15, 44 y.o.   MRN: [id number]  HPI  Here to f/u; overall doing ok,  Pt denies chest pain, increasing sob or doe, wheezing, orthopnea, PND, increased LE swelling, palpitations, dizziness or syncope.  Pt denies new neurological symptoms such as new headache, or facial or extremity weakness or numbness.  Pt denies polydipsia, polyuria, or low sugar episode.   Pt denies new neurological symptoms such as new headache, or facial or extremity weakness or numbness.   Pt states overall good compliance with meds, mostly trying to follow appropriate diet, with wt overall stable,  but little exercise however.  Also with mild persistent worsening reflux, out of PPI refill, worse at night, does not eat large meal before lie down,  and no other abd pain, dysphagia, n/v, bowel change or blood.  Was not able to get labs dones last visit as had family urgency post visit.  Needs doctor note for today.  Has seen podiatry recently wit foot exam c/w mild early neuropathy Past Medical History  Diagnosis Date  . ABSCESS, TOOTH 04/23/2009  . Esophageal reflux 04/27/2009  . FATIGUE 04/27/2009  . HYPERLIPIDEMIA 04/24/2007  . HYPERTENSION 04/24/2007  . INSOMNIA-SLEEP DISORDER-UNSPEC 04/27/2009  . LUMBAR STRAIN, ACUTE 12/11/2008  . RASH-NONVESICULAR 03/11/2008  . Shortness of breath     "lying down and w/exertion right now" (12/22/2012)  . DIABETES MELLITUS, TYPE I 07/30/2007  . CHF (congestive heart failure) [facility])    Past Surgical History  Procedure Laterality Date  . No past surgeries      reports that he has quit smoking. His smoking use included Cigarettes. He has never used smokeless tobacco. He reports that he drinks about 1.2 oz of alcohol per week. He reports that he does not use illicit drugs. family history includes Hypertension in his mother. No Known Allergies  Current Outpatient Prescriptions on File Prior to Visit  Medication Sig Dispense  Refill  . aspirin EC 81 MG EC tablet Take 1 tablet (81 mg total) by mouth daily.    Marland Kitchen atorvastatin (LIPITOR) 40 MG tablet Take 40 mg by mouth daily at 6 PM.    . carvedilol (COREG) 25 MG tablet Take 1 tablet (25 mg total) by mouth 2 (two) times daily. 60 tablet 11  . furosemide (LASIX) 40 MG tablet Take 1 tablet (40 mg total) by mouth daily. 35 tablet 11  . insulin NPH-regular Human (NOVOLIN 70/30) (70-30) 100 UNIT/ML injection Inject 30-35 Units into the skin 2 (two) times daily with a meal. 35 units in the morning, 30 units in the evening    . lisinopril-hydrochlorothiazide (PRINZIDE,ZESTORETIC) 20-12.5 MG per tablet Take 2 tablets by mouth daily.      . metFORMIN (GLUCOPHAGE) 500 MG tablet Take 250 mg by mouth 2 (two) times daily.     . metoprolol succinate (TOPROL XL) 25 MG 24 hr tablet Take 1 tablet (25 mg total) by mouth daily. 30 tablet 0   No current facility-administered medications on file prior to visit.   Review of Systems  Constitutional: Negative for unusual diaphoresis or night sweats HENT: Negative for ringing in ear or discharge Eyes: Negative for double vision or worsening visual disturbance.  Respiratory: Negative for choking and stridor.   Gastrointestinal: Negative for vomiting or other signifcant bowel change Genitourinary: Negative for hematuria or change in urine volume.  Musculoskeletal: Negative for other MSK pain or swelling Skin: Negative for color change and  worsening wound.  Neurological: Negative for tremors and numbness other than noted  Psychiatric/Behavioral: Negative for decreased concentration or agitation other than above       Objective:   Physical Exam BP 150/106 mmHg  Pulse 90  Temp(Src) 97.8 F (36.6 C) (Oral)  Ht 5\' 6"  (1.676 m)  Wt 204 lb (92.534 kg)  BMI 32.94 kg/m2  SpO2 98% VS noted,  Constitutional: Pt appears in no significant distress HENT: Head: NCAT.  Right Ear: External ear normal.  Left Ear: External ear normal.  Eyes: .  Pupils are equal, round, and reactive to light. Conjunctivae and EOM are normal Neck: Normal range of motion. Neck supple.  Cardiovascular: Normal rate and regular rhythm.   Pulmonary/Chest: Effort normal and breath sounds without rales or wheezing.  Abd:  Soft, NT, ND, + BS Neurological: Pt is alert. Not confused , motor grossly intact Skin: Skin is warm. No rash, no LE edema Psychiatric: Pt behavior is normal. No agitation.     Assessment & Plan:

## 2015-07-22 NOTE — Assessment & Plan Note (Signed)
Uncontrolled, for PPI restart, o/w uncomplicated,  to f/u any worsening symptoms or concerns

## 2015-07-22 NOTE — Assessment & Plan Note (Signed)
Mild elevated, o/w stable overall by history and exam, recent data reviewed with pt, and pt to continue medical treatment as before except to increase the toprol to 50 mg per day ,  to f/u any worsening symptoms or concerns BP Readings from Last 3 Encounters:  07/22/15 150/106  04/13/15 176/112  03/18/15 156/102

## 2015-07-22 NOTE — Progress Notes (Signed)
Pre visit review using our clinic review tool, if applicable. No additional management support is needed unless otherwise documented below in the visit note. 

## 2015-07-22 NOTE — Patient Instructions (Addendum)
Your Prilosec was refilled  OK to increase the toprol XL to 50 mg per day as your Blood Pressure is consistenly high  Please continue all other medications as before, and refills have been done if requested.  Please have the pharmacy call with any other refills you may need.  Please continue your efforts at being more active, low cholesterol diet, and weight control.  You are otherwise up to date with prevention measures today.  Please keep your appointments with your specialists as you may have planned  Please go to the LAB in the Basement (turn left off the elevator) for the tests to be done today  You will be contacted by phone if any changes need to be made immediately.  Otherwise, you will receive a letter about your results with an explanation, but please check with MyChart first.  Please remember to sign up for MyChart if you have not done so, as this will be important to you in the future with finding out test results, communicating by private email, and scheduling acute appointments online when needed.  Please return in 6 months, or sooner if needed, with Lab testing done 3-5 days before

## 2015-07-22 NOTE — Assessment & Plan Note (Signed)
Has been uncontrolled in the past, stable overall by history and exam, recent data reviewed with pt, and pt to continue medical treatment as before,  to f/u any worsening symptoms or concerns For f/u labs today Lab Results  Component Value Date   HGBA1C 11.4* 01/02/2013

## 2015-08-14 DIAGNOSIS — N179 Acute kidney failure, unspecified: Secondary | ICD-10-CM

## 2015-08-14 HISTORY — DX: Acute kidney failure, unspecified: N17.9

## 2015-08-24 ENCOUNTER — Ambulatory Visit (INDEPENDENT_AMBULATORY_CARE_PROVIDER_SITE_OTHER): Payer: Federal, State, Local not specified - PPO | Admitting: Internal Medicine

## 2015-08-24 ENCOUNTER — Encounter: Payer: Self-pay | Admitting: Internal Medicine

## 2015-08-24 VITALS — BP 198/112 | HR 96 | Temp 98.1°F | Ht 66.0 in | Wt 211.0 lb

## 2015-08-24 DIAGNOSIS — I5030 Unspecified diastolic (congestive) heart failure: Secondary | ICD-10-CM | POA: Diagnosis not present

## 2015-08-24 DIAGNOSIS — J019 Acute sinusitis, unspecified: Secondary | ICD-10-CM | POA: Diagnosis not present

## 2015-08-24 DIAGNOSIS — I10 Essential (primary) hypertension: Secondary | ICD-10-CM | POA: Diagnosis not present

## 2015-08-24 MED ORDER — LEVOFLOXACIN 500 MG PO TABS: 500.0000 mg | ORAL_TABLET | Freq: Every day | ORAL | Status: DC

## 2015-08-24 MED ORDER — HYDROCODONE-HOMATROPINE 5-1.5 MG/5ML PO SYRP: 5.0000 mL | ORAL_SOLUTION | Freq: Four times a day (QID) | ORAL | Status: DC | PRN

## 2015-08-24 NOTE — Progress Notes (Signed)
Subjective:    Patient ID: Hector Mahoney, male    DOB: [birth year], 45 y.o.   MRN: [id number]  HPI   Here with 2-3 days acute onset fever, facial pain, pressure, headache, general weakness and malaise, and greenish d/c, with mild ST and cough, but pt denies chest pain, wheezing, increased sob or doe, orthopnea, PND, increased LE swelling, palpitations, dizziness or syncope.  Pt denies new neurological symptoms such as new facial or extremity weakness or numbness   Pt denies polydipsia, polyuria.  Pt denies wt loss, night sweats, loss of appetite, or other constitutional symptoms Past Medical History  Diagnosis Date  . ABSCESS, TOOTH 04/23/2009  . Esophageal reflux 04/27/2009  . FATIGUE 04/27/2009  . HYPERLIPIDEMIA 04/24/2007  . HYPERTENSION 04/24/2007  . INSOMNIA-SLEEP DISORDER-UNSPEC 04/27/2009  . LUMBAR STRAIN, ACUTE 12/11/2008  . RASH-NONVESICULAR 03/11/2008  . Shortness of breath     "lying down and w/exertion right now" (12/22/2012)  . DIABETES MELLITUS, TYPE I 07/30/2007  . CHF (congestive heart failure) [facility])    Past Surgical History  Procedure Laterality Date  . No past surgeries      reports that he has quit smoking. His smoking use included Cigarettes. He has never used smokeless tobacco. He reports that he drinks about 1.2 oz of alcohol per week. He reports that he does not use illicit drugs. family history includes Hypertension in his mother. No Known Allergies Current Outpatient Prescriptions on File Prior to Visit  Medication Sig Dispense Refill  . aspirin EC 81 MG EC tablet Take 1 tablet (81 mg total) by mouth daily.    Marland Kitchen atorvastatin (LIPITOR) 40 MG tablet Take 40 mg by mouth daily at 6 PM.    . carvedilol (COREG) 25 MG tablet Take 1 tablet (25 mg total) by mouth 2 (two) times daily. 60 tablet 11  . furosemide (LASIX) 40 MG tablet Take 1 tablet (40 mg total) by mouth daily. 35 tablet 11  . insulin NPH-regular Human (NOVOLIN 70/30) (70-30) 100 UNIT/ML injection Inject  30-35 Units into the skin 2 (two) times daily with a meal. 35 units in the morning, 30 units in the evening    . lisinopril-hydrochlorothiazide (PRINZIDE,ZESTORETIC) 20-12.5 MG per tablet Take 2 tablets by mouth daily.      . metFORMIN (GLUCOPHAGE) 500 MG tablet Take 250 mg by mouth 2 (two) times daily.     . metoprolol succinate (TOPROL-XL) 50 MG 24 hr tablet Take 1 tablet (50 mg total) by mouth daily. Take with or immediately following a meal. 90 tablet 3  . omeprazole (PRILOSEC) 20 MG capsule Take 1 capsule (20 mg total) by mouth 2 (two) times daily before a meal. 180 capsule 3   No current facility-administered medications on file prior to visit.   Review of Systems  Constitutional: Negative for unusual diaphoresis or night sweats HENT: Negative for ringing in ear or discharge Eyes: Negative for double vision or worsening visual disturbance.  Respiratory: Negative for choking and stridor.   Gastrointestinal: Negative for vomiting or other signifcant bowel change Genitourinary: Negative for hematuria or change in urine volume.  Musculoskeletal: Negative for other MSK pain or swelling Skin: Negative for color change and worsening wound.  Neurological: Negative for tremors and numbness other than noted  Psychiatric/Behavioral: Negative for decreased concentration or agitation other than above       Objective:   Physical Exam BP 198/112 mmHg  Pulse 96  Temp(Src) 98.1 F (36.7 C) (Oral)  Ht 5\' 6"  (1.676 m)  Wt 211 lb (95.709 kg)  BMI 34.07 kg/m2  SpO2 99% VS noted,  Constitutional: Pt appears in no significant distress HENT: Head: NCAT.  Right Ear: External ear normal.  Left Ear: External ear normal.  Bilat tm's with mild erythema.  Max sinus areas mild tender.  Pharynx with mild erythema, no exudate Eyes: . Pupils are equal, round, and reactive to light. Conjunctivae and EOM are normal Neck: Normal range of motion. Neck supple.  Cardiovascular: Normal rate and regular rhythm.     Pulmonary/Chest: Effort normal and breath sounds without rales or wheezing.  Neurological: Pt is alert. Not confused , motor grossly intact Skin: Skin is warm. No rash, no LE edema Psychiatric: Pt behavior is normal. No agitation.     Assessment & Plan:

## 2015-08-24 NOTE — Progress Notes (Signed)
Pre visit review using our clinic review tool, if applicable. No additional management support is needed unless otherwise documented below in the visit note. 

## 2015-08-24 NOTE — Patient Instructions (Signed)
Please take all new medication as prescribed - the antibiotic, and cough medicine  Please continue all other medications as before, and refills have been done if requested.  Please have the pharmacy call with any other refills you may need.  Please continue your efforts at being more active, diabetic low cholesterol diet, and weight control.  Please keep your appointments with your specialists as you may have planned

## 2015-08-25 NOTE — Assessment & Plan Note (Signed)
stable overall by history and exam, recent data reviewed with pt, and pt to continue medical treatment as before,  to f/u any worsening symptoms or concerns BP Readings from Last 3 Encounters:  08/24/15 198/112  07/22/15 150/106  04/13/15 176/112

## 2015-08-25 NOTE — Assessment & Plan Note (Signed)
Mild to mod, for antibx course,  to f/u any worsening symptoms or concerns 

## 2015-08-25 NOTE — Assessment & Plan Note (Signed)
stable overall by history and exam, and pt to continue medical treatment as before,  to f/u any worsening symptoms or concerns 

## 2015-09-02 ENCOUNTER — Observation Stay (HOSPITAL_COMMUNITY): Payer: Federal, State, Local not specified - PPO

## 2015-09-02 ENCOUNTER — Other Ambulatory Visit (HOSPITAL_COMMUNITY): Payer: Federal, State, Local not specified - PPO

## 2015-09-02 ENCOUNTER — Encounter (HOSPITAL_COMMUNITY): Payer: Self-pay | Admitting: *Deleted

## 2015-09-02 ENCOUNTER — Emergency Department (HOSPITAL_COMMUNITY): Payer: Federal, State, Local not specified - PPO

## 2015-09-02 ENCOUNTER — Inpatient Hospital Stay (HOSPITAL_COMMUNITY)
Admission: EM | Admit: 2015-09-02 | Discharge: 2015-09-06 | DRG: 683 | Disposition: A | Discharge status: Home / Self Care | Payer: Federal, State, Local not specified - PPO | Attending: Family Medicine | Admitting: Family Medicine

## 2015-09-02 DIAGNOSIS — E1065 Type 1 diabetes mellitus with hyperglycemia: Secondary | ICD-10-CM | POA: Diagnosis present

## 2015-09-02 DIAGNOSIS — E860 Dehydration: Secondary | ICD-10-CM | POA: Diagnosis present

## 2015-09-02 DIAGNOSIS — I5033 Acute on chronic diastolic (congestive) heart failure: Secondary | ICD-10-CM | POA: Diagnosis present

## 2015-09-02 DIAGNOSIS — I129 Hypertensive chronic kidney disease with stage 1 through stage 4 chronic kidney disease, or unspecified chronic kidney disease: Secondary | ICD-10-CM | POA: Diagnosis present

## 2015-09-02 DIAGNOSIS — N179 Acute kidney failure, unspecified: Principal | ICD-10-CM | POA: Diagnosis present

## 2015-09-02 DIAGNOSIS — K219 Gastro-esophageal reflux disease without esophagitis: Secondary | ICD-10-CM | POA: Diagnosis present

## 2015-09-02 DIAGNOSIS — Z794 Long term (current) use of insulin: Secondary | ICD-10-CM

## 2015-09-02 DIAGNOSIS — N183 Chronic kidney disease, stage 3 unspecified: Secondary | ICD-10-CM

## 2015-09-02 DIAGNOSIS — I160 Hypertensive urgency: Secondary | ICD-10-CM | POA: Diagnosis present

## 2015-09-02 DIAGNOSIS — K0889 Other specified disorders of teeth and supporting structures: Secondary | ICD-10-CM

## 2015-09-02 DIAGNOSIS — Z9119 Patient's noncompliance with other medical treatment and regimen: Secondary | ICD-10-CM

## 2015-09-02 DIAGNOSIS — E1022 Type 1 diabetes mellitus with diabetic chronic kidney disease: Secondary | ICD-10-CM | POA: Diagnosis present

## 2015-09-02 DIAGNOSIS — E785 Hyperlipidemia, unspecified: Secondary | ICD-10-CM | POA: Diagnosis present

## 2015-09-02 DIAGNOSIS — I5032 Chronic diastolic (congestive) heart failure: Secondary | ICD-10-CM | POA: Diagnosis present

## 2015-09-02 DIAGNOSIS — Z8249 Family history of ischemic heart disease and other diseases of the circulatory system: Secondary | ICD-10-CM

## 2015-09-02 DIAGNOSIS — R739 Hyperglycemia, unspecified: Secondary | ICD-10-CM

## 2015-09-02 DIAGNOSIS — Z79899 Other long term (current) drug therapy: Secondary | ICD-10-CM

## 2015-09-02 DIAGNOSIS — Z87891 Personal history of nicotine dependence: Secondary | ICD-10-CM

## 2015-09-02 DIAGNOSIS — K047 Periapical abscess without sinus: Secondary | ICD-10-CM | POA: Diagnosis present

## 2015-09-02 DIAGNOSIS — Z9114 Patient's other noncompliance with medication regimen: Secondary | ICD-10-CM

## 2015-09-02 DIAGNOSIS — K029 Dental caries, unspecified: Secondary | ICD-10-CM | POA: Diagnosis present

## 2015-09-02 DIAGNOSIS — Z91199 Patient's noncompliance with other medical treatment and regimen due to unspecified reason: Secondary | ICD-10-CM

## 2015-09-02 HISTORY — DX: Acute kidney failure, unspecified: N17.9

## 2015-09-02 LAB — URINE MICROSCOPIC-ADD ON

## 2015-09-02 LAB — CBC WITH DIFFERENTIAL/PLATELET
Basophils Absolute: 0 10*3/uL (ref 0.0–0.1)
Basophils Relative: 0 %
Eosinophils Absolute: 0.1 10*3/uL (ref 0.0–0.7)
Eosinophils Relative: 1 %
HEMATOCRIT: 39 % (ref 39.0–52.0)
HEMOGLOBIN: 12.7 g/dL — AB (ref 13.0–17.0)
LYMPHS ABS: 2.1 10*3/uL (ref 0.7–4.0)
Lymphocytes Relative: 18 %
MCH: 25.6 pg — AB (ref 26.0–34.0)
MCHC: 32.6 g/dL (ref 30.0–36.0)
MCV: 78.6 fL (ref 78.0–100.0)
Monocytes Absolute: 0.5 10*3/uL (ref 0.1–1.0)
Monocytes Relative: 4 %
NEUTROS ABS: 9.1 10*3/uL — AB (ref 1.7–7.7)
NEUTROS PCT: 77 %
Platelets: 237 10*3/uL (ref 150–400)
RBC: 4.96 MIL/uL (ref 4.22–5.81)
RDW: 13.4 % (ref 11.5–15.5)
WBC: 11.7 10*3/uL — ABNORMAL HIGH (ref 4.0–10.5)

## 2015-09-02 LAB — URINALYSIS, ROUTINE W REFLEX MICROSCOPIC
Bilirubin Urine: NEGATIVE
Glucose, UA: >1000 mg/dL — AB
Ketones, ur: NEGATIVE mg/dL
Leukocytes, UA: NEGATIVE
Nitrite: NEGATIVE
PH: 5 (ref 5.0–8.0)
Protein, ur: >300 mg/dL — AB
SPECIFIC GRAVITY, URINE: 1.023 (ref 1.005–1.030)

## 2015-09-02 LAB — GLUCOSE, CAPILLARY
GLUCOSE-CAPILLARY: 369 mg/dL — AB (ref 65–99)
Glucose-Capillary: 144 mg/dL — ABNORMAL HIGH (ref 65–99)

## 2015-09-02 LAB — CBG MONITORING, ED
GLUCOSE-CAPILLARY: 363 mg/dL — AB (ref 65–99)
GLUCOSE-CAPILLARY: 291 mg/dL — AB (ref 65–99)
GLUCOSE-CAPILLARY: 356 mg/dL — AB (ref 65–99)

## 2015-09-02 LAB — BASIC METABOLIC PANEL
ANION GAP: 10 (ref 5–15)
BUN: 39 mg/dL — ABNORMAL HIGH (ref 6–20)
CO2: 23 mmol/L (ref 22–32)
CREATININE: 2.56 mg/dL — AB (ref 0.61–1.24)
Calcium: 9.3 mg/dL (ref 8.9–10.3)
Chloride: 102 mmol/L (ref 101–111)
GFR calc non Af Amer: 29 mL/min — ABNORMAL LOW (ref >60)
GFR, EST AFRICAN AMERICAN: 34 mL/min — AB (ref >60)
Glucose, Bld: 412 mg/dL — ABNORMAL HIGH (ref 65–99)
Potassium: 4.7 mmol/L (ref 3.5–5.1)
Sodium: 135 mmol/L (ref 135–145)

## 2015-09-02 LAB — I-STAT TROPONIN, ED: Troponin i, poc: 0.06 ng/mL (ref 0.00–0.08)

## 2015-09-02 MED ORDER — FLUTICASONE PROPIONATE 50 MCG/ACT NA SUSP
2.0000 | Freq: Every day | NASAL | Status: DC
  Administered 2015-09-03 – 2015-09-06 (×4): 2 via NASAL
  Filled 2015-09-02: qty 16

## 2015-09-02 MED ORDER — INSULIN ASPART 100 UNIT/ML ~~LOC~~ SOLN
0.0000 [IU] | Freq: Three times a day (TID) | SUBCUTANEOUS | Status: DC
  Administered 2015-09-02: 11 [IU] via SUBCUTANEOUS
  Administered 2015-09-02: 15 [IU] via SUBCUTANEOUS
  Administered 2015-09-03: 5 [IU] via SUBCUTANEOUS
  Administered 2015-09-03: 3 [IU] via SUBCUTANEOUS
  Administered 2015-09-03: 8 [IU] via SUBCUTANEOUS
  Administered 2015-09-04 (×2): 5 [IU] via SUBCUTANEOUS
  Administered 2015-09-04: 3 [IU] via SUBCUTANEOUS
  Administered 2015-09-05: 2 [IU] via SUBCUTANEOUS
  Administered 2015-09-05: 5 [IU] via SUBCUTANEOUS
  Administered 2015-09-05: 2 [IU] via SUBCUTANEOUS
  Administered 2015-09-06: 3 [IU] via SUBCUTANEOUS
  Administered 2015-09-06: 5 [IU] via SUBCUTANEOUS
  Filled 2015-09-02: qty 1

## 2015-09-02 MED ORDER — INSULIN ASPART 100 UNIT/ML ~~LOC~~ SOLN
10.0000 [IU] | Freq: Once | SUBCUTANEOUS | Status: AC
  Administered 2015-09-02: 10 [IU] via SUBCUTANEOUS
  Filled 2015-09-02: qty 1

## 2015-09-02 MED ORDER — SODIUM CHLORIDE 0.9 % IJ SOLN
3.0000 mL | Freq: Two times a day (BID) | INTRAMUSCULAR | Status: DC
  Administered 2015-09-04: 3 mL via INTRAVENOUS

## 2015-09-02 MED ORDER — ATORVASTATIN CALCIUM 40 MG PO TABS
40.0000 mg | ORAL_TABLET | Freq: Every day | ORAL | Status: DC
  Administered 2015-09-02 – 2015-09-05 (×4): 40 mg via ORAL
  Filled 2015-09-02 (×4): qty 1

## 2015-09-02 MED ORDER — OXYCODONE HCL 5 MG PO TABS
5.0000 mg | ORAL_TABLET | ORAL | Status: DC | PRN
  Administered 2015-09-02 – 2015-09-05 (×4): 5 mg via ORAL
  Filled 2015-09-02: qty 2
  Filled 2015-09-02 (×5): qty 1

## 2015-09-02 MED ORDER — HYDRALAZINE HCL 25 MG PO TABS
25.0000 mg | ORAL_TABLET | Freq: Three times a day (TID) | ORAL | Status: DC
  Administered 2015-09-02 – 2015-09-03 (×3): 25 mg via ORAL
  Filled 2015-09-02 (×4): qty 1

## 2015-09-02 MED ORDER — LIVING WELL WITH DIABETES BOOK
Freq: Once | Status: DC
  Filled 2015-09-02: qty 1

## 2015-09-02 MED ORDER — ENOXAPARIN SODIUM 40 MG/0.4ML ~~LOC~~ SOLN
40.0000 mg | SUBCUTANEOUS | Status: DC
  Administered 2015-09-02: 40 mg via SUBCUTANEOUS
  Filled 2015-09-02 (×2): qty 0.4

## 2015-09-02 MED ORDER — ACETAMINOPHEN 650 MG RE SUPP: 650.0000 mg | Freq: Four times a day (QID) | RECTAL | Status: DC | PRN

## 2015-09-02 MED ORDER — INSULIN ASPART PROT & ASPART (70-30 MIX) 100 UNIT/ML ~~LOC~~ SUSP: 30.0000 [IU] | Freq: Two times a day (BID) | SUBCUTANEOUS | Status: DC

## 2015-09-02 MED ORDER — SODIUM CHLORIDE 0.9 % IV SOLN
INTRAVENOUS | Status: DC
  Administered 2015-09-02 – 2015-09-06 (×7): via INTRAVENOUS

## 2015-09-02 MED ORDER — SODIUM CHLORIDE 0.9 % IV BOLUS (SEPSIS): 500.0000 mL | Freq: Once | INTRAVENOUS | Status: DC

## 2015-09-02 MED ORDER — DIPHENHYDRAMINE HCL 50 MG/ML IJ SOLN
25.0000 mg | Freq: Once | INTRAMUSCULAR | Status: AC
  Administered 2015-09-02: 25 mg via INTRAVENOUS
  Filled 2015-09-02: qty 1

## 2015-09-02 MED ORDER — CARVEDILOL 25 MG PO TABS
25.0000 mg | ORAL_TABLET | Freq: Two times a day (BID) | ORAL | Status: DC
  Administered 2015-09-02 – 2015-09-06 (×9): 25 mg via ORAL
  Filled 2015-09-02 (×2): qty 1
  Filled 2015-09-02: qty 2
  Filled 2015-09-02 (×6): qty 1

## 2015-09-02 MED ORDER — ACETAMINOPHEN 325 MG PO TABS: 650.0000 mg | ORAL_TABLET | Freq: Four times a day (QID) | ORAL | Status: DC | PRN

## 2015-09-02 MED ORDER — METOCLOPRAMIDE HCL 5 MG/ML IJ SOLN
10.0000 mg | Freq: Once | INTRAMUSCULAR | Status: AC
  Administered 2015-09-02: 10 mg via INTRAVENOUS
  Filled 2015-09-02: qty 2

## 2015-09-02 MED ORDER — INSULIN ASPART PROT & ASPART (70-30 MIX) 100 UNIT/ML ~~LOC~~ SUSP
35.0000 [IU] | Freq: Every day | SUBCUTANEOUS | Status: DC
  Administered 2015-09-02 – 2015-09-05 (×4): 35 [IU] via SUBCUTANEOUS
  Filled 2015-09-02: qty 10

## 2015-09-02 MED ORDER — LABETALOL HCL 5 MG/ML IV SOLN
10.0000 mg | Freq: Once | INTRAVENOUS | Status: AC
  Administered 2015-09-02: 10 mg via INTRAVENOUS
  Filled 2015-09-02: qty 4

## 2015-09-02 MED ORDER — INSULIN ASPART 100 UNIT/ML ~~LOC~~ SOLN
0.0000 [IU] | Freq: Every day | SUBCUTANEOUS | Status: DC
  Administered 2015-09-03: 2 [IU] via SUBCUTANEOUS

## 2015-09-02 MED ORDER — PANTOPRAZOLE SODIUM 40 MG PO TBEC
40.0000 mg | DELAYED_RELEASE_TABLET | Freq: Every day | ORAL | Status: DC
  Administered 2015-09-03 – 2015-09-06 (×4): 40 mg via ORAL
  Filled 2015-09-02 (×4): qty 1

## 2015-09-02 MED ORDER — SODIUM CHLORIDE 0.9 % IV BOLUS (SEPSIS)
1000.0000 mL | Freq: Once | INTRAVENOUS | Status: AC
  Administered 2015-09-02: 1000 mL via INTRAVENOUS

## 2015-09-02 MED ORDER — INSULIN ASPART PROT & ASPART (70-30 MIX) 100 UNIT/ML ~~LOC~~ SUSP
30.0000 [IU] | Freq: Every day | SUBCUTANEOUS | Status: DC
  Administered 2015-09-03 – 2015-09-06 (×4): 30 [IU] via SUBCUTANEOUS
  Filled 2015-09-02: qty 10

## 2015-09-02 MED ORDER — ASPIRIN EC 81 MG PO TBEC
81.0000 mg | DELAYED_RELEASE_TABLET | Freq: Every day | ORAL | Status: DC
  Administered 2015-09-02 – 2015-09-06 (×5): 81 mg via ORAL
  Filled 2015-09-02 (×5): qty 1

## 2015-09-02 NOTE — ED Notes (Signed)
Pt states his BP normally runs high, took his BP meds.

## 2015-09-02 NOTE — H&P (Signed)
[facility] History and Physical                                                                                    Hector Mahoney, is a 45 y.o. male  MRN: [id number]   DOB - [birth year]  Admit Date - 09/02/2015  Outpatient Primary MD for the patient is Hector Cower, MD  Referring MD: Ralene Bathe / ER  PMH: Past Medical History  Diagnosis Date  . ABSCESS, TOOTH 04/23/2009  . Esophageal reflux 04/27/2009  . FATIGUE 04/27/2009  . HYPERLIPIDEMIA 04/24/2007  . HYPERTENSION 04/24/2007  . INSOMNIA-SLEEP DISORDER-UNSPEC 04/27/2009  . LUMBAR STRAIN, ACUTE 12/11/2008  . RASH-NONVESICULAR 03/11/2008  . Shortness of breath     "lying down and w/exertion right now" (12/22/2012)  . DIABETES MELLITUS, TYPE I 07/30/2007  . CHF (congestive heart failure) (HCC)       PSH: Past Surgical History  Procedure Laterality Date  . No past surgeries       CC:  Chief Complaint  Patient presents with  . Dental Pain  . Facial Pain     HPI: 45 year old male patient with known diabetes, and hypertension as well as dyslipidemia. Patient has a history of being noncompliant with medications. Recently seen at primary care physician office for sinus pain and pressure felt to be related to sinus infection and has completed a course of empiric Levaquin. She presented to ER today with continued facial pain as well as intermittent blurred vision and nausea. Also had mild diffuse headache. Patient has a history of chronic diastolic heart failure and was recently evaluated by cardiology in August 2016 with planned follow-up echocardiogram. During that visit patient had been having shortness of breath and dyspnea on exertion and diuretics were adjusted to daily.  Patient reports improvement in dyspnea. Patient admits to only recently resuming medications and is typically noncompliant due to what he describes as "laziness". Upon my evaluation patient reports headache has resolved as has blurred vision and sinus pressure.  ER  Evaluation and treatment: Initially afebrile but with uncontrolled hypertension BP 221/126, pulse 84 respirations 20-follow-up blood pressure after treatment in ER was 145/83 Two-view chest x-ray: No acute process CT of the head without contrast: No acute intercranial pathology in the visualized paranasal sinuses and mastoid air cells are well aerated with no evidence of sinusitis Abnormal labs: BUN 29 and creatinine 2.56 with previous baseline BUN 17 and creatinine 2.0 August 2016, glucose 12, WBCs 11,700 with neutrophils 77% absolute neutrophils 9.1% Reglan 10 mg IV 1 Benadryl 25 mg IV 1 Labetalol 10 mg IV 1 Normal saline 1 L 1 Regular NovoLog insulin 10 units subcutaneous 1  Review of Systems   In addition to the HPI above,  No Fever-chills, myalgias or other constitutional symptoms No changes with hearing, new weakness, tingling, numbness in any extremity, No problems swallowing food or Liquids, indigestion/reflux No Chest pain, Cough or Shortness of Breath, palpitations, orthopnea or DOE No Abdominal pain, N/V; no melena or hematochezia, no dark tarry stools, Bowel movements are regular, No dysuria, hematuria or flank pain No new skin rashes, lesions, masses or bruises, No new joints pains-aches No recent weight gain or  loss  *A full 10 point Review of Systems was done, except as stated above, all other Review of Systems were negative.  Social History Social History  Substance Use Topics  . Smoking status: Former Smoker    Types: Cigarettes  . Smokeless tobacco: Never Used     Comment: 12/22/2012 "used to smoke cigarettes once or twice/month; haven't had any cigarettes for years"  . Alcohol Use: 1.2 oz/week    1 Cans of beer, 1 Shots of liquor per week     Comment: 12/22/2012 "once mixed or one beer 2 times/week"    Resides at: Private residence  Lives with: Significant other  Ambulatory status: Without assistive devices   Family History Family History  Problem  Relation Age of Onset  . Hypertension Patient reports multiple family members with coronary artery disease  Mother      Prior to Admission medications   Medication Sig Start Date End Date Taking? Authorizing Provider  atorvastatin (LIPITOR) 40 MG tablet Take 40 mg by mouth daily at 6 PM.   Yes Historical Provider, MD  carvedilol (COREG) 25 MG tablet Take 1 tablet (25 mg total) by mouth 2 (two) times daily. 04/13/15  Yes Skeet Latch, MD  furosemide (LASIX) 40 MG tablet Take 1 tablet (40 mg total) by mouth daily. 04/13/15  Yes Skeet Latch, MD  HYDROcodone-homatropine [facility]) 5-1.5 MG/5ML syrup Take 5 mLs by mouth every 6 (six) hours as needed for cough. 08/24/15  Yes Biagio Borg, MD  insulin NPH-regular Human (NOVOLIN 70/30) (70-30) 100 UNIT/ML injection Inject 30-35 Units into the skin 2 (two) times daily with a meal. 35 units in the morning, 30 units in the evening   Yes Historical Provider, MD  levofloxacin (LEVAQUIN) 500 MG tablet Take 1 tablet (500 mg total) by mouth daily. 08/24/15  Yes Biagio Borg, MD  lisinopril-hydrochlorothiazide (PRINZIDE,ZESTORETIC) 20-12.5 MG per tablet Take 2 tablets by mouth daily.     Yes Historical Provider, MD  metFORMIN (GLUCOPHAGE) 500 MG tablet Take 250 mg by mouth 2 (two) times daily.    Yes Historical Provider, MD  metoprolol succinate (TOPROL-XL) 50 MG 24 hr tablet Take 1 tablet (50 mg total) by mouth daily. Take with or immediately following a meal. 07/22/15  Yes Biagio Borg, MD  omeprazole (PRILOSEC) 20 MG capsule Take 1 capsule (20 mg total) by mouth 2 (two) times daily before a meal. 07/22/15  Yes Biagio Borg, MD    No Known Allergies  Physical Exam  Vitals  Blood pressure 145/83, pulse 84, temperature 98.3 F (36.8 C), temperature source Oral, resp. rate 11, SpO2 95 %.   General:  In no acute distress, appears healthy and well nourished  Psych:  Normal affect, Denies Suicidal or Homicidal ideations, Awake Alert, Oriented X 3.  Speech and thought patterns are clear and appropriate, no apparent short term memory deficits  Neuro:   No focal neurological deficits, CN II through XII intact, Strength 5/5 all 4 extremities, Sensation intact all 4 extremities.  ENT:  Ears and Eyes appear Normal, Conjunctivae clear, PER. Moist oral mucosa without erythema or exudates.  Neck:  Supple, No lymphadenopathy appreciated  Respiratory:  Symmetrical chest wall movement, Good air movement bilaterally, CTAB. Room Air  Cardiac:  RRR, No Murmurs, no LE edema noted, no JVD, No carotid bruits, peripheral pulses palpable at 2+  Abdomen:  Positive bowel sounds, Soft, Non tender, Non distended,  No masses appreciated, no obvious hepatosplenomegaly  Skin:  No Cyanosis, Normal  Skin Turgor, No Skin Rash or Bruise.  Extremities: Symmetrical without obvious trauma or injury,  no effusions.  Data Review  CBC  Recent Labs Lab 09/02/15 0533  WBC 11.7*  HGB 12.7*  HCT 39.0  PLT 237  MCV 78.6  MCH 25.6*  MCHC 32.6  RDW 13.4  LYMPHSABS 2.1  MONOABS 0.5  EOSABS 0.1  BASOSABS 0.0    Chemistries   Recent Labs Lab 09/02/15 0533  NA 135  K 4.7  CL 102  CO2 23  GLUCOSE 412*  BUN 39*  CREATININE 2.56*  CALCIUM 9.3    estimated creatinine clearance is 40.7 mL/min (by C-G formula based on Cr of 2.56).  No results for input(s): TSH, T4TOTAL, T3FREE, THYROIDAB in the last 72 hours.  Invalid input(s): FREET3  Coagulation profile No results for input(s): INR, PROTIME in the last 168 hours.  No results for input(s): DDIMER in the last 72 hours.  Cardiac Enzymes No results for input(s): CKMB, TROPONINI, MYOGLOBIN in the last 168 hours.  Invalid input(s): CK  Invalid input(s): POCBNP  Urinalysis    Component Value Date/Time   COLORURINE YELLOW 03/29/2014 1729   APPEARANCEUR CLEAR 03/29/2014 1729   LABSPEC 1.020 03/29/2014 1729   PHURINE 5.0 03/29/2014 1729   GLUCOSEU 500* 03/29/2014 1729   GLUCOSEU >=1000  04/27/2009 1531   HGBUR MODERATE* 03/29/2014 1729   HGBUR trace-intact 04/23/2009 0936   BILIRUBINUR NEGATIVE 03/29/2014 1729   KETONESUR 15* 03/29/2014 1729   PROTEINUR >300* 03/29/2014 1729   UROBILINOGEN 0.2 03/29/2014 1729   NITRITE NEGATIVE 03/29/2014 1729   LEUKOCYTESUR NEGATIVE 03/29/2014 1729    Imaging results:   Dg Chest 2 View  09/02/2015  CLINICAL DATA:  45 year old male with history of CHF and hypertensive headache. EXAM: CHEST  2 VIEW COMPARISON:  Radiograph dated 03/18/2015 FINDINGS: The heart size and mediastinal contours are within normal limits. Both lungs are clear. The visualized skeletal structures are unremarkable. IMPRESSION: No active cardiopulmonary disease. Electronically Signed   By: Anner Crete M.D.   On: 09/02/2015 06:34   Ct Head Wo Contrast  09/02/2015  CLINICAL DATA:  45 year old male with hypertension and blurred vision EXAM: CT HEAD WITHOUT CONTRAST TECHNIQUE: Contiguous axial images were obtained from the base of the skull through the vertex without intravenous contrast. COMPARISON:  Brain MRI dated 02/28/2007 FINDINGS: The ventricles and the sulci are appropriate in size for the patient's age. There is no intracranial hemorrhage. No midline shift or mass effect identified. The gray-white matter differentiation is preserved. The visualized paranasal sinuses and mastoid air cells are well aerated. The calvarium is intact. IMPRESSION: No acute intracranial pathology. Electronically Signed   By: Anner Crete M.D.   On: 09/02/2015 06:46     EKG: (Independently reviewed)  sinus rhythm with ventricular rate 91 bpm, QTC 465 ms, voltage criteria met for LVH with associated ST elevation in leads V3 and V4 no acute anemic changes   Assessment & Plan  Principal Problem:   Acute renal failure on CKD, stage III -Obs/Tele -suspect underlying diabetic nephropathy (chronic) and dehydration from persistent hyperglycemia (acute) -hold ACE I and diuretics,  Metformin -gentle IVFs -follow labs -ck UA re; ?? Proteinuria -Likely will need outpatient nephrology consultation -In for size with patient need for appropriate hypertensive and glycemic control to prevent dialysis  Active Problems:   Hypertensive urgency -Rapidly improved after given medications in ER which is suggestive of continued noncompliance with medications prior to arrival -Have substituted hydralazine for ACE inhibitor for  now -Was on Toprol and carvedilol prior to admission have opted to utilize carvedilol only    Chronic diastolic heart failure, NYHA class 3  -Last echo 2014 -Cardiology documented at August visit planned echo but doesn't appear was done (patient compliant issue?) -Check echo this admission    Diabetes mellitus, insulin dependent (IDDM), uncontrolled  -Due to noncompliance -Anion gap normal -Continue 70/30 insulin 30 units BID -Check hemoglobin A1c -Hold metformin-if renal function does not improve may need to permanently discontinue this medication -Continue Amaryl -SSI -Diabetes educator consultation    Dehydration -Secondary to profound hyperglycemia -Gentle IV fluid hydration and hold diuretics    Hyperlipidemia -cont statin    Patient nonadherence -Cost not an issue for medication since patient reports obtains medications from [place] -[facility] if Lantus formulary -Extensive counseling with patient regarding severe vascular problems that result from uncontrolled hypertension diabetes including but not limited to heart attack, stroke, peripheral vascular disease with limb loss and Alice's    DVT Prophylaxis: Lovenox  Family Communication:   Significant other at bedside  Code Status:  Full code  Condition:  Stable  Discharge disposition: Anticipate discharge back to previous home environment likely next 48 hours pending medical stability  Time spent in minutes : 60      ELLIS,ALLISON L. ANP on 09/02/2015 at 9:03  AM  You may contact me by going to [email or web address] - password [email or web address]  I am available from 7a-7p but please confirm I am on the schedule by going to Amion as above.   After 7p please contact night coverage person covering me after hours  [facility]

## 2015-09-02 NOTE — ED Notes (Signed)
Attempted report will call back  

## 2015-09-02 NOTE — ED Notes (Signed)
MD at bedside. 

## 2015-09-02 NOTE — ED Notes (Signed)
Lunch tray ordered for pt.

## 2015-09-02 NOTE — ED Notes (Signed)
Meal tray ordered for pt  

## 2015-09-02 NOTE — ED Provider Notes (Signed)
CSN: [id number]     Arrival date & time 09/02/15  0030 History   First MD Initiated Contact with Patient 09/02/15 [phone]     Chief Complaint  Patient presents with  . Dental Pain  . Facial Pain     Patient is a 45 y.o. male presenting with tooth pain. The history is provided by the patient. No language interpreter was used.  Dental Pain  Hector Mahoney is a 45 y.o. male who presents to the Emergency Department complaining of facial pain.  He reports 1 week of right-sided facial pain. He was seen by his PCP and started on antibiotics for a sinus infection. He is now also having some left-sided facial pain. He reports intermittent blurred vision. Nausea. He denies any fevers, chest pain, shortness of breath. He denies any numbness or weakness. He has associated headache. He has a history of diabetes and hypertension. He states he is taking his medications as prescribed. Symptoms are moderate, constant, worsening.  Past Medical History  Diagnosis Date  . ABSCESS, TOOTH 04/23/2009  . Esophageal reflux 04/27/2009  . FATIGUE 04/27/2009  . HYPERLIPIDEMIA 04/24/2007  . HYPERTENSION 04/24/2007  . INSOMNIA-SLEEP DISORDER-UNSPEC 04/27/2009  . LUMBAR STRAIN, ACUTE 12/11/2008  . RASH-NONVESICULAR 03/11/2008  . Shortness of breath     "lying down and w/exertion right now" (12/22/2012)  . DIABETES MELLITUS, TYPE I 07/30/2007  . CHF (congestive heart failure) [facility])    Past Surgical History  Procedure Laterality Date  . No past surgeries     Family History  Problem Relation Age of Onset  . Hypertension Mother    Social History  Substance Use Topics  . Smoking status: Former Smoker    Types: Cigarettes  . Smokeless tobacco: Never Used     Comment: 12/22/2012 "used to smoke cigarettes once or twice/month; haven't had any cigarettes for years"  . Alcohol Use: 1.2 oz/week    1 Cans of beer, 1 Shots of liquor per week     Comment: 12/22/2012 "once mixed or one beer 2 times/week"    Review of Systems   All other systems reviewed and are negative.     Allergies  Review of patient's allergies indicates no known allergies.  Home Medications   Prior to Admission medications   Medication Sig Start Date End Date Taking? Authorizing Provider  atorvastatin (LIPITOR) 40 MG tablet Take 40 mg by mouth daily at 6 PM.   Yes Historical Provider, MD  carvedilol (COREG) 25 MG tablet Take 1 tablet (25 mg total) by mouth 2 (two) times daily. 04/13/15  Yes Skeet Latch, MD  furosemide (LASIX) 40 MG tablet Take 1 tablet (40 mg total) by mouth daily. 04/13/15  Yes Skeet Latch, MD  HYDROcodone-homatropine [facility]) 5-1.5 MG/5ML syrup Take 5 mLs by mouth every 6 (six) hours as needed for cough. 08/24/15  Yes Biagio Borg, MD  insulin NPH-regular Human (NOVOLIN 70/30) (70-30) 100 UNIT/ML injection Inject 30-35 Units into the skin 2 (two) times daily with a meal. 35 units in the morning, 30 units in the evening   Yes Historical Provider, MD  levofloxacin (LEVAQUIN) 500 MG tablet Take 1 tablet (500 mg total) by mouth daily. 08/24/15  Yes Biagio Borg, MD  lisinopril-hydrochlorothiazide (PRINZIDE,ZESTORETIC) 20-12.5 MG per tablet Take 2 tablets by mouth daily.     Yes Historical Provider, MD  metFORMIN (GLUCOPHAGE) 500 MG tablet Take 250 mg by mouth 2 (two) times daily.    Yes Historical Provider, MD  metoprolol succinate (TOPROL-XL)  50 MG 24 hr tablet Take 1 tablet (50 mg total) by mouth daily. Take with or immediately following a meal. 07/22/15  Yes Biagio Borg, MD  omeprazole (PRILOSEC) 20 MG capsule Take 1 capsule (20 mg total) by mouth 2 (two) times daily before a meal. 07/22/15  Yes Biagio Borg, MD   BP 143/93 mmHg  Pulse 82  Temp(Src) 98.3 F (36.8 C) (Oral)  Resp 11  SpO2 97% Physical Exam  Constitutional: He is oriented to person, place, and time. He appears well-developed and well-nourished.  HENT:  Head: Normocephalic and atraumatic.  Right Ear: External ear normal.  Left Ear: External ear  normal.  Mouth/Throat: Oropharynx is clear and moist.  Eyes: Pupils are equal, round, and reactive to light.  Cardiovascular: Normal rate and regular rhythm.   No murmur heard. Pulmonary/Chest: Effort normal and breath sounds normal. No respiratory distress.  Abdominal: Soft. There is no tenderness. There is no rebound and no guarding.  Musculoskeletal: He exhibits no edema or tenderness.  Neurological: He is alert and oriented to person, place, and time.  Skin: Skin is warm and dry.  Psychiatric: He has a normal mood and affect. His behavior is normal.  Nursing note and vitals reviewed.   ED Course  Procedures (including critical care time) Labs Review Labs Reviewed  BASIC METABOLIC PANEL - Abnormal; Notable for the following:    Glucose, Bld 412 (*)    BUN 39 (*)    Creatinine, Ser 2.56 (*)    GFR calc non Af Amer 29 (*)    GFR calc Af Amer 34 (*)    All other components within normal limits  CBC WITH DIFFERENTIAL/PLATELET - Abnormal; Notable for the following:    WBC 11.7 (*)    Hemoglobin 12.7 (*)    MCH 25.6 (*)    Neutro Abs 9.1 (*)    All other components within normal limits  CBG MONITORING, ED - Abnormal; Notable for the following:    Glucose-Capillary 363 (*)    All other components within normal limits  HEMOGLOBIN A1C  URINALYSIS, ROUTINE W REFLEX MICROSCOPIC (NOT AT [facility])  Randolm Idol, ED    Imaging Review Dg Chest 2 View  09/02/2015  CLINICAL DATA:  45 year old male with history of CHF and hypertensive headache. EXAM: CHEST  2 VIEW COMPARISON:  Radiograph dated 03/18/2015 FINDINGS: The heart size and mediastinal contours are within normal limits. Both lungs are clear. The visualized skeletal structures are unremarkable. IMPRESSION: No active cardiopulmonary disease. Electronically Signed   By: Anner Crete M.D.   On: 09/02/2015 06:34   Ct Head Wo Contrast  09/02/2015  CLINICAL DATA:  45 year old male with hypertension and blurred vision EXAM: CT HEAD  WITHOUT CONTRAST TECHNIQUE: Contiguous axial images were obtained from the base of the skull through the vertex without intravenous contrast. COMPARISON:  Brain MRI dated 02/28/2007 FINDINGS: The ventricles and the sulci are appropriate in size for the patient's age. There is no intracranial hemorrhage. No midline shift or mass effect identified. The gray-white matter differentiation is preserved. The visualized paranasal sinuses and mastoid air cells are well aerated. The calvarium is intact. IMPRESSION: No acute intracranial pathology. Electronically Signed   By: Anner Crete M.D.   On: 09/02/2015 06:46   I have personally reviewed and evaluated these images and lab results as part of my medical decision-making.   EKG Interpretation   Date/Time:  Friday September 02 2015 06:08:21 EST Ventricular Rate:  91 PR Interval:  193  QRS Duration: 88 QT Interval:  378 QTC Calculation: 465 R Axis:   -41 Text Interpretation:  Age not entered, assumed to be  45 years old for  purpose of ECG interpretation Sinus rhythm Probable left atrial  enlargement Abnormal R-wave progression, late transition LVH with  secondary repolarization abnormality Anterior ST elevation, probably due  to LVH Baseline wander in lead(s) V2 Confirmed by Hazle Coca [phone]) on  09/02/2015 6:23:56 AM      MDM   Final diagnoses:  Hypertensive urgency  Acute kidney injury ([place])  Hyperglycemia    Pt with hx/o DM and HTN here for HA concerning for hypertensive urgency.  BMP demonstrates acute kidney injury, marked hyperglycemia. Patient's headache is improved after her blood pressure control. Plan to admit for hypertensive urgency with kidney injury. Discussed findings of studies with patient and recommendation for admission and patient is in agreement with plan. Hospitalist consulted for admission.    Hector Reichert, MD 09/02/15 [phone]

## 2015-09-02 NOTE — Progress Notes (Signed)
Inpatient Diabetes Program Recommendations  AACE/ADA: New Consensus Statement on Inpatient Glycemic Control (2015)  Target Ranges:  Prepandial:   less than 140 mg/dL      Peak postprandial:   less than 180 mg/dL (1-2 hours)      Critically ill patients:  140 - 180 mg/dL   Review of Glycemic Control  Diabetes history: type 2 Outpatient Diabetes medications: 70/30 35 units in the am and 30 units ac supper Metformin 250 mg bid Current orders for Inpatient glycemic control: Moderate correction tidwc and HS scales, 70/30 30 units in the am and 35 units with supper-to begin tonight  Inpatient Diabetes Program Recommendations: Spoke with patient at length regarding his statement that he has 'been lazy about taking all his medications'. He states he was having polyuria and blurred vision, but he states otherwise he felt fine.    He states that his wife of 15 years died 4 years ago with complications of type 1 diabetes which she developed at 51 mos of age. Pt admits that he probably has a bit of depression and thus avoids caring for his diabetes (and as well for his blood pressure control) He states that he may adhere to his meds for a while and then 'get lazy and not want to take'. Patient admits that it may be helpful to talk to a psychiatrist and/or psychologist to help him to deal with what he has never really done when his wife died.  Noted 70/30 to be started this evening-RN is giving now. Asked patient if he would find it easier to use an insulin pen rather than vial and syringe and he stated that 'no, he didn't need that or want that'. Pt states he is now aware that he may need to talk with someone about his depression and to release his sadness and possibly anger regarding losing his wife to diabetes. Will follow and glad to assist. Gave patient my contact information. Will order OP education as patient would like to attend some form of education again.  Thank you Rosita Kea, RN, MSN, CDE   Diabetes Inpatient Program Office: [phone] Pager: [phone] 8:00 am to 5:00 pm

## 2015-09-02 NOTE — ED Notes (Signed)
Patient transported to X-ray 

## 2015-09-02 NOTE — ED Notes (Signed)
p c/o dental pain for years. States that he is having a sinus infection making his dental pain worse.

## 2015-09-02 NOTE — Progress Notes (Signed)
UA with > 300 protein which is suggestive of medical renal disease- recommend OP follow up for diabetic nephropathy and close monitoring of renal function  Erin Hearing, ANP

## 2015-09-02 NOTE — ED Notes (Signed)
Pt CBG, 356.

## 2015-09-03 ENCOUNTER — Observation Stay (HOSPITAL_BASED_OUTPATIENT_CLINIC_OR_DEPARTMENT_OTHER): Payer: Federal, State, Local not specified - PPO

## 2015-09-03 DIAGNOSIS — I421 Obstructive hypertrophic cardiomyopathy: Secondary | ICD-10-CM

## 2015-09-03 DIAGNOSIS — E860 Dehydration: Secondary | ICD-10-CM

## 2015-09-03 DIAGNOSIS — N179 Acute kidney failure, unspecified: Secondary | ICD-10-CM | POA: Diagnosis not present

## 2015-09-03 DIAGNOSIS — I160 Hypertensive urgency: Secondary | ICD-10-CM | POA: Diagnosis not present

## 2015-09-03 DIAGNOSIS — Z9119 Patient's noncompliance with other medical treatment and regimen: Secondary | ICD-10-CM

## 2015-09-03 DIAGNOSIS — N183 Chronic kidney disease, stage 3 (moderate): Secondary | ICD-10-CM

## 2015-09-03 DIAGNOSIS — E785 Hyperlipidemia, unspecified: Secondary | ICD-10-CM

## 2015-09-03 LAB — BASIC METABOLIC PANEL
Anion gap: 8 (ref 5–15)
BUN: 34 mg/dL — AB (ref 6–20)
CO2: 24 mmol/L (ref 22–32)
CREATININE: 2.55 mg/dL — AB (ref 0.61–1.24)
Calcium: 9 mg/dL (ref 8.9–10.3)
Chloride: 108 mmol/L (ref 101–111)
GFR calc Af Amer: 34 mL/min — ABNORMAL LOW (ref >60)
GFR, EST NON AFRICAN AMERICAN: 29 mL/min — AB (ref >60)
GLUCOSE: 131 mg/dL — AB (ref 65–99)
POTASSIUM: 4.5 mmol/L (ref 3.5–5.1)
Sodium: 140 mmol/L (ref 135–145)

## 2015-09-03 LAB — GLUCOSE, CAPILLARY
GLUCOSE-CAPILLARY: 170 mg/dL — AB (ref 65–99)
GLUCOSE-CAPILLARY: 259 mg/dL — AB (ref 65–99)
Glucose-Capillary: 226 mg/dL — ABNORMAL HIGH (ref 65–99)
Glucose-Capillary: 245 mg/dL — ABNORMAL HIGH (ref 65–99)

## 2015-09-03 LAB — HEMOGLOBIN A1C
Hgb A1c MFr Bld: 13.5 % — ABNORMAL HIGH (ref 4.8–5.6)
MEAN PLASMA GLUCOSE: 341 mg/dL

## 2015-09-03 MED ORDER — ISOSORBIDE MONONITRATE ER 30 MG PO TB24
15.0000 mg | ORAL_TABLET | Freq: Every day | ORAL | Status: DC
  Administered 2015-09-03 – 2015-09-06 (×4): 15 mg via ORAL
  Filled 2015-09-03 (×4): qty 1

## 2015-09-03 MED ORDER — CLINDAMYCIN HCL 300 MG PO CAPS
300.0000 mg | ORAL_CAPSULE | Freq: Three times a day (TID) | ORAL | Status: DC
  Administered 2015-09-03 – 2015-09-06 (×11): 300 mg via ORAL
  Filled 2015-09-03 (×11): qty 1

## 2015-09-03 NOTE — Progress Notes (Signed)
Hector Mahoney [id number] DOB: November 25, 1970 DOA: 09/02/2015 PCP: Cathlean Cower, MD  Brief narrative:  45 y/o ? htn DM ty II-not compliant on meds HFprEF grade III DD 2014 Known Caries and yet to see a dentist  Admitted to [facility] with HTn urgency Found to have AKI bun /Creat 22/1.58--->33/2.5  panorex L lower molar abcess  Past medical history-As per Problem list Chart reviewed as below-   Consultants:  none  Procedures:  none  Antibiotics:  CLindamycin po 1/21   Subjective   Fair Feels much better  no mouth panm  No chills no rigors No cp No n/v   Objective    Interim History:   Telemetry:    Objective: Filed Vitals:   09/02/15 1130 09/02/15 1309 09/02/15 1953 09/03/15 0421  BP: 150/88 141/84 158/85 145/89  Pulse: 88 85  80  Temp:  98.3 F (36.8 C) 98.3 F (36.8 C) 98.2 F (36.8 C)  TempSrc:  Oral Oral Oral  Resp: 16 16 16 17   Height:  5\' 6"  (1.676 m)    Weight:  92.534 kg (204 lb)  92.761 kg (204 lb 8 oz)  SpO2: 96% 100% 100% 100%   No intake or output data in the 24 hours ending 09/03/15 0731  Exam:  General: eomi ncat Cardiovascular: s1 s2 no m/r/g Respiratory:  Clear no added sound Abdomen: soft nt nd  Skin no le edema Neuro intact  Data Reviewed: Basic Metabolic Panel:  Recent Labs Lab 09/02/15 0533 09/03/15 0322  NA 135 140  K 4.7 4.5  CL 102 108  CO2 23 24  GLUCOSE 412* 131*  BUN 39* 34*  CREATININE 2.56* 2.55*  CALCIUM 9.3 9.0   Liver Function Tests: No results for input(s): AST, ALT, ALKPHOS, BILITOT, PROT, ALBUMIN in the last 168 hours. No results for input(s): LIPASE, AMYLASE in the last 168 hours. No results for input(s): AMMONIA in the last 168 hours. CBC:  Recent Labs Lab 09/02/15 0533  WBC 11.7*  NEUTROABS 9.1*  HGB 12.7*  HCT 39.0  MCV 78.6  PLT 237   Cardiac Enzymes: No results for input(s): CKTOTAL, CKMB, CKMBINDEX, TROPONINI in the last 168 hours. BNP: Invalid input(s):  POCBNP CBG:  Recent Labs Lab 09/02/15 0959 09/02/15 1157 09/02/15 1715 09/02/15 2045 09/03/15 0624  GLUCAP 291* 356* 369* 144* 170*    No results found for this or any previous visit (from the past 240 hour(s)).   Studies:              All Imaging reviewed and is as per above notation   Scheduled Meds: . aspirin EC  81 mg Oral Daily  . atorvastatin  40 mg Oral q1800  . carvedilol  25 mg Oral BID  . enoxaparin (LOVENOX) injection  40 mg Subcutaneous Q24H  . fluticasone  2 spray Each Nare Daily  . hydrALAZINE  25 mg Oral 3 times per day  . insulin aspart  0-15 Units Subcutaneous TID WC  . insulin aspart  0-5 Units Subcutaneous QHS  . insulin aspart protamine- aspart  30 Units Subcutaneous Q breakfast  . insulin aspart protamine- aspart  35 Units Subcutaneous Q supper  . living well with diabetes book   Does not apply Once  . pantoprazole  40 mg Oral Daily  . sodium chloride  3 mL Intravenous Q12H   Continuous Infusions: . sodium chloride 50 mL/hr at 09/02/15 1321     Assessment/Plan:  1. Htn urgency-initial pressures 221/126-still not controlled but  better.  Cont coreg 25 bid.  Held Prinzide 20-12.5 given aki.  Added imdur 15 daily 2. Dental abscess clindamycin 300 tid till sees dentist 3. DM ty II-continue 70/30 30 am and 35 pm insulin .  continue SSI supplementation as well.  cbg 170-259.  Compliance enforced. 4. AKI-2/2 to DM and HTN with superimposed AKI.  Saline 50 cc/hr.  bmet am and if down-trending can d/c home am.  education as OP 5. Chr diastolic HF, ef [id number] currently. cut lasix home dose form 4020 ion d/c   D/w family d/c home? Am Full code   [name], MD  [facility] Pager [phone] 09/03/2015, 7:31 AM    LOS: 1 day

## 2015-09-03 NOTE — Progress Notes (Signed)
  Echocardiogram 2D Echocardiogram has been performed.  Darlina Sicilian M 09/03/2015, 2:58 PM

## 2015-09-04 DIAGNOSIS — E860 Dehydration: Secondary | ICD-10-CM | POA: Diagnosis not present

## 2015-09-04 DIAGNOSIS — N183 Chronic kidney disease, stage 3 (moderate): Secondary | ICD-10-CM | POA: Diagnosis not present

## 2015-09-04 DIAGNOSIS — N179 Acute kidney failure, unspecified: Secondary | ICD-10-CM | POA: Diagnosis not present

## 2015-09-04 DIAGNOSIS — I160 Hypertensive urgency: Secondary | ICD-10-CM | POA: Diagnosis not present

## 2015-09-04 LAB — GLUCOSE, CAPILLARY
GLUCOSE-CAPILLARY: 221 mg/dL — AB (ref 65–99)
Glucose-Capillary: 201 mg/dL — ABNORMAL HIGH (ref 65–99)
Glucose-Capillary: 176 mg/dL — ABNORMAL HIGH (ref 65–99)
Glucose-Capillary: 183 mg/dL — ABNORMAL HIGH (ref 65–99)

## 2015-09-04 LAB — BASIC METABOLIC PANEL
Anion gap: 6 (ref 5–15)
BUN: 33 mg/dL — AB (ref 6–20)
CHLORIDE: 112 mmol/L — AB (ref 101–111)
CO2: 21 mmol/L — AB (ref 22–32)
CREATININE: 2.41 mg/dL — AB (ref 0.61–1.24)
Calcium: 8.6 mg/dL — ABNORMAL LOW (ref 8.9–10.3)
GFR calc non Af Amer: 31 mL/min — ABNORMAL LOW (ref >60)
GFR, EST AFRICAN AMERICAN: 36 mL/min — AB (ref >60)
GLUCOSE: 182 mg/dL — AB (ref 65–99)
Potassium: 4.1 mmol/L (ref 3.5–5.1)
Sodium: 139 mmol/L (ref 135–145)

## 2015-09-04 LAB — CBC
HCT: 34.5 % — ABNORMAL LOW (ref 39.0–52.0)
Hemoglobin: 11.1 g/dL — ABNORMAL LOW (ref 13.0–17.0)
MCH: 25.2 pg — AB (ref 26.0–34.0)
MCHC: 32.2 g/dL (ref 30.0–36.0)
MCV: 78.4 fL (ref 78.0–100.0)
PLATELETS: 219 10*3/uL (ref 150–400)
RBC: 4.4 MIL/uL (ref 4.22–5.81)
RDW: 14 % (ref 11.5–15.5)
WBC: 8.2 10*3/uL (ref 4.0–10.5)

## 2015-09-04 MED ORDER — CALCIUM CARBONATE ANTACID 500 MG PO CHEW
400.0000 mg | CHEWABLE_TABLET | Freq: Three times a day (TID) | ORAL | Status: DC
  Administered 2015-09-04 – 2015-09-06 (×7): 400 mg via ORAL
  Filled 2015-09-04 (×7): qty 2

## 2015-09-04 NOTE — Progress Notes (Signed)
Patient given exit care documents on diabetic diet, seasoning with out salt, heart healthy diet. Will monitor patient. Nasier Thumm, Bettina Gavia jRN

## 2015-09-04 NOTE — Progress Notes (Signed)
Hector Mahoney [id number] DOB: 10-Aug-1971 DOA: 09/02/2015 PCP: Cathlean Cower, MD  Brief narrative:  45 y/o ? htn DM ty II-not compliant on meds HFprEF grade III DD 2014 Known Caries and yet to see a dentist  Admitted to [facility] with HTn urgency Found to have AKI bun /Creat 22/1.58--->33/2.5  panorex L lower molar abcess  Past medical history-As per Problem list Chart reviewed as below-   Consultants:  none  Procedures:  none  Antibiotics:  CLindamycin po 1/21   Subjective   Denies nausea vomiting abdominal pain Tolerating diet Slept poorly No chest pain no shortness of breath   Objective    Interim History:   Telemetry:    Objective: Filed Vitals:   09/03/15 1501 09/03/15 2127 09/04/15 0410 09/04/15 0959  BP: 144/85 136/78 111/60 127/65  Pulse: 85 84 73   Temp: 98.2 F (36.8 C) 98.3 F (36.8 C) 97.8 F (36.6 C)   TempSrc: Oral Oral Oral   Resp: 18 16 16    Height:      Weight:      SpO2: 99% 97% 99%     Intake/Output Summary (Last 24 hours) at 09/04/15 1013 Last data filed at 09/03/15 1700  Gross per 24 hour  Intake    640 ml  Output      0 ml  Net    640 ml    Exam:  General: eomi ncat Cardiovascular: s1 s2 no m/r/g Respiratory:  Clear no added sound Abdomen: soft nt nd  Skin no le edema Neuro intact  Data Reviewed: Basic Metabolic Panel:  Recent Labs Lab 09/02/15 0533 09/03/15 0322 09/04/15 0358  NA 135 140 139  K 4.7 4.5 4.1  CL 102 108 112*  CO2 23 24 21*  GLUCOSE 412* 131* 182*  BUN 39* 34* 33*  CREATININE 2.56* 2.55* 2.41*  CALCIUM 9.3 9.0 8.6*   Liver Function Tests: No results for input(s): AST, ALT, ALKPHOS, BILITOT, PROT, ALBUMIN in the last 168 hours. No results for input(s): LIPASE, AMYLASE in the last 168 hours. No results for input(s): AMMONIA in the last 168 hours. CBC:  Recent Labs Lab 09/02/15 0533 09/04/15 0358  WBC 11.7* 8.2  NEUTROABS 9.1*  --   HGB 12.7* 11.1*  HCT 39.0 34.5*  MCV 78.6  78.4  PLT 237 219   Cardiac Enzymes: No results for input(s): CKTOTAL, CKMB, CKMBINDEX, TROPONINI in the last 168 hours. BNP: Invalid input(s): POCBNP CBG:  Recent Labs Lab 09/03/15 0624 09/03/15 1106 09/03/15 1605 09/03/15 2243 09/04/15 0619  GLUCAP 170* 259* 226* 245* 201*    No results found for this or any previous visit (from the past 240 hour(s)).   Studies:              All Imaging reviewed and is as per above notation   Scheduled Meds: . aspirin EC  81 mg Oral Daily  . atorvastatin  40 mg Oral q1800  . carvedilol  25 mg Oral BID  . clindamycin  300 mg Oral 3 times per day  . enoxaparin (LOVENOX) injection  40 mg Subcutaneous Q24H  . fluticasone  2 spray Each Nare Daily  . insulin aspart  0-15 Units Subcutaneous TID WC  . insulin aspart  0-5 Units Subcutaneous QHS  . insulin aspart protamine- aspart  30 Units Subcutaneous Q breakfast  . insulin aspart protamine- aspart  35 Units Subcutaneous Q supper  . isosorbide mononitrate  15 mg Oral Daily  . living well with diabetes  book   Does not apply Once  . pantoprazole  40 mg Oral Daily  . sodium chloride  3 mL Intravenous Q12H   Continuous Infusions: . sodium chloride 100 mL/hr at 09/04/15 1009     Assessment/Plan:  1. Htn urgency-initial pressures 221/126-still not controlled but better.  Cont coreg 25 bid.  Held Prinzide 20-12.5 given aki.  Added imdur 15 daily.  Blood pressures are much better controlled 2. Dental abscess clindamycin 300 tid till sees dentist 3. DM ty II-continue 70/30 30 am and 35 pm insulin .  continue SSI supplementation as well.  cbg 201-245.  Compliance enforced. 4. AKI-2/2 to DM and HTN with superimposed AKI.  Saline 50 -->one hundred ccpr.  Rpt Bmet am and if down-trending can d/c home am.  education as OP 5. Chr diastolic HF, ef 99991111 [id number] currently. cut lasix home dose form 40-20 on d/c   D/w family  d/c home? Am Pending resolution of creatinine Full code   [name], MD  [facility] Pager [phone] 09/04/2015, 10:13 AM    LOS: 2 days

## 2015-09-05 ENCOUNTER — Observation Stay (HOSPITAL_COMMUNITY): Payer: Federal, State, Local not specified - PPO

## 2015-09-05 DIAGNOSIS — E860 Dehydration: Secondary | ICD-10-CM | POA: Diagnosis present

## 2015-09-05 DIAGNOSIS — R739 Hyperglycemia, unspecified: Secondary | ICD-10-CM | POA: Diagnosis present

## 2015-09-05 DIAGNOSIS — Z79899 Other long term (current) drug therapy: Secondary | ICD-10-CM | POA: Diagnosis not present

## 2015-09-05 DIAGNOSIS — Z9114 Patient's other noncompliance with medication regimen: Secondary | ICD-10-CM | POA: Diagnosis not present

## 2015-09-05 DIAGNOSIS — I129 Hypertensive chronic kidney disease with stage 1 through stage 4 chronic kidney disease, or unspecified chronic kidney disease: Secondary | ICD-10-CM | POA: Diagnosis present

## 2015-09-05 DIAGNOSIS — E1022 Type 1 diabetes mellitus with diabetic chronic kidney disease: Secondary | ICD-10-CM | POA: Diagnosis present

## 2015-09-05 DIAGNOSIS — E1065 Type 1 diabetes mellitus with hyperglycemia: Secondary | ICD-10-CM | POA: Diagnosis present

## 2015-09-05 DIAGNOSIS — N179 Acute kidney failure, unspecified: Secondary | ICD-10-CM | POA: Diagnosis present

## 2015-09-05 DIAGNOSIS — I160 Hypertensive urgency: Secondary | ICD-10-CM | POA: Diagnosis present

## 2015-09-05 DIAGNOSIS — K219 Gastro-esophageal reflux disease without esophagitis: Secondary | ICD-10-CM | POA: Diagnosis present

## 2015-09-05 DIAGNOSIS — N183 Chronic kidney disease, stage 3 (moderate): Secondary | ICD-10-CM | POA: Diagnosis present

## 2015-09-05 DIAGNOSIS — Z8249 Family history of ischemic heart disease and other diseases of the circulatory system: Secondary | ICD-10-CM | POA: Diagnosis not present

## 2015-09-05 DIAGNOSIS — E785 Hyperlipidemia, unspecified: Secondary | ICD-10-CM | POA: Diagnosis present

## 2015-09-05 DIAGNOSIS — K029 Dental caries, unspecified: Secondary | ICD-10-CM | POA: Diagnosis present

## 2015-09-05 DIAGNOSIS — I5032 Chronic diastolic (congestive) heart failure: Secondary | ICD-10-CM | POA: Diagnosis present

## 2015-09-05 DIAGNOSIS — Z87891 Personal history of nicotine dependence: Secondary | ICD-10-CM | POA: Diagnosis not present

## 2015-09-05 DIAGNOSIS — Z794 Long term (current) use of insulin: Secondary | ICD-10-CM | POA: Diagnosis not present

## 2015-09-05 DIAGNOSIS — K047 Periapical abscess without sinus: Secondary | ICD-10-CM | POA: Diagnosis present

## 2015-09-05 LAB — BASIC METABOLIC PANEL
ANION GAP: 5 (ref 5–15)
BUN: 33 mg/dL — ABNORMAL HIGH (ref 6–20)
CALCIUM: 8.6 mg/dL — AB (ref 8.9–10.3)
CO2: 22 mmol/L (ref 22–32)
Chloride: 113 mmol/L — ABNORMAL HIGH (ref 101–111)
Creatinine, Ser: 2.62 mg/dL — ABNORMAL HIGH (ref 0.61–1.24)
GFR calc Af Amer: 33 mL/min — ABNORMAL LOW (ref >60)
GFR, EST NON AFRICAN AMERICAN: 28 mL/min — AB (ref >60)
Glucose, Bld: 119 mg/dL — ABNORMAL HIGH (ref 65–99)
POTASSIUM: 4.4 mmol/L (ref 3.5–5.1)
SODIUM: 140 mmol/L (ref 135–145)

## 2015-09-05 LAB — SODIUM, URINE, RANDOM: SODIUM UR: 97 mmol/L

## 2015-09-05 LAB — RAPID URINE DRUG SCREEN, HOSP PERFORMED
Amphetamines: NOT DETECTED
BARBITURATES: NOT DETECTED
Benzodiazepines: NOT DETECTED
Cocaine: NOT DETECTED
Opiates: NOT DETECTED
TETRAHYDROCANNABINOL: NOT DETECTED

## 2015-09-05 LAB — GLUCOSE, CAPILLARY
GLUCOSE-CAPILLARY: 124 mg/dL — AB (ref 65–99)
GLUCOSE-CAPILLARY: 222 mg/dL — AB (ref 65–99)
Glucose-Capillary: 136 mg/dL — ABNORMAL HIGH (ref 65–99)
Glucose-Capillary: 148 mg/dL — ABNORMAL HIGH (ref 65–99)

## 2015-09-05 LAB — CREATININE, URINE, RANDOM: CREATININE, URINE: 79.41 mg/dL

## 2015-09-05 NOTE — Progress Notes (Signed)
Hector Mahoney [id number] DOB: 1971/08/02 DOA: 09/02/2015 PCP: Cathlean Cower, MD  Brief narrative:  45 y/o ? htn DM ty II-not compliant on meds HFprEF grade III DD 2014 Known Caries and yet to see a dentist  Admitted to [facility] with HTn urgency Found to have AKI bun /Creat 22/1.58--->33/2.5  panorex L lower molar abcess  Past medical history-As per Problem list Chart reviewed as below-   Consultants:  none  Procedures:  none  Antibiotics:  CLindamycin po 1/21   Subjective   Feels fair No issues  no n/v/cp Passing urien fairly   Objective    Interim History:   Telemetry:    Objective: Filed Vitals:   09/04/15 2131 09/05/15 0542 09/05/15 0548 09/05/15 1023  BP: 153/97 148/91 119/67 160/93  Pulse: 86 99 85   Temp: 98.2 F (36.8 C) 98.1 F (36.7 C) 99.2 F (37.3 C)   TempSrc: Oral Oral Oral   Resp: 18 18 18    Height:      Weight:      SpO2: 100% 100% 93%     Intake/Output Summary (Last 24 hours) at 09/05/15 1202 Last data filed at 09/05/15 0800  Gross per 24 hour  Intake 2718.67 ml  Output      1 ml  Net 2717.67 ml    Exam:  General: eomi ncat Cardiovascular: s1 s2 no m/r/g Respiratory:  Clear no added sound Abdomen: soft nt nd  Skin no le edema Neuro intact  Data Reviewed: Basic Metabolic Panel:  Recent Labs Lab 09/02/15 0533 09/03/15 0322 09/04/15 0358 09/05/15 0343  NA 135 140 139 140  K 4.7 4.5 4.1 4.4  CL 102 108 112* 113*  CO2 23 24 21* 22  GLUCOSE 412* 131* 182* 119*  BUN 39* 34* 33* 33*  CREATININE 2.56* 2.55* 2.41* 2.62*  CALCIUM 9.3 9.0 8.6* 8.6*   Liver Function Tests: No results for input(s): AST, ALT, ALKPHOS, BILITOT, PROT, ALBUMIN in the last 168 hours. No results for input(s): LIPASE, AMYLASE in the last 168 hours. No results for input(s): AMMONIA in the last 168 hours. CBC:  Recent Labs Lab 09/02/15 0533 09/04/15 0358  WBC 11.7* 8.2  NEUTROABS 9.1*  --   HGB 12.7* 11.1*  HCT 39.0 34.5*  MCV  78.6 78.4  PLT 237 219   Cardiac Enzymes: No results for input(s): CKTOTAL, CKMB, CKMBINDEX, TROPONINI in the last 168 hours. BNP: Invalid input(s): POCBNP CBG:  Recent Labs Lab 09/04/15 1110 09/04/15 1632 09/04/15 2130 09/05/15 0620 09/05/15 1150  GLUCAP 176* 221* 183* 124* 136*    No results found for this or any previous visit (from the past 240 hour(s)).   Studies:              All Imaging reviewed and is as per above notation   Scheduled Meds: . aspirin EC  81 mg Oral Daily  . atorvastatin  40 mg Oral q1800  . calcium carbonate  400 mg of elemental calcium Oral TID  . carvedilol  25 mg Oral BID  . clindamycin  300 mg Oral 3 times per day  . enoxaparin (LOVENOX) injection  40 mg Subcutaneous Q24H  . fluticasone  2 spray Each Nare Daily  . insulin aspart  0-15 Units Subcutaneous TID WC  . insulin aspart  0-5 Units Subcutaneous QHS  . insulin aspart protamine- aspart  30 Units Subcutaneous Q breakfast  . insulin aspart protamine- aspart  35 Units Subcutaneous Q supper  . isosorbide mononitrate  15 mg Oral Daily  . living well with diabetes book   Does not apply Once  . pantoprazole  40 mg Oral Daily  . sodium chloride  3 mL Intravenous Q12H   Continuous Infusions: . sodium chloride 100 mL/hr at 09/05/15 0526     Assessment/Plan:  1. Htn urgency-initial pressures 221/126-still not controlled but better.  Cont coreg 25 bid.  Held Prinzide 20-12.5 given aki.  Added imdur 15 daily.  Blood pressures are much better controlled 2. Dental abscess clindamycin 300 tid till sees dentist 3. DM ty II-continue 70/30 30 am and 35 pm insulin .  continue SSI supplementation as well.  cbg 201-245.  Compliance enforced. 4. AKI-2/2 to DM and HTN with superimposed AKI.  Saline 50 -->one hundred ccpr. FeNa shows Intrinsic renal issues so await US kidney.  Would consult Nephrology today.  Rpt bmet am 5. Chr diastolic HF, ef [id number] currently. cut lasix home dose form  40-20 on d/c   D/w family  d/c home Full code   [name], MD  [facility] Pager [phone] 09/05/2015, 12:02 PM    LOS: 3 days

## 2015-09-06 ENCOUNTER — Encounter: Payer: Self-pay | Admitting: Family Medicine

## 2015-09-06 LAB — BASIC METABOLIC PANEL
ANION GAP: 9 (ref 5–15)
BUN: 25 mg/dL — ABNORMAL HIGH (ref 6–20)
CALCIUM: 8.9 mg/dL (ref 8.9–10.3)
CO2: 21 mmol/L — AB (ref 22–32)
Chloride: 109 mmol/L (ref 101–111)
Creatinine, Ser: 2.13 mg/dL — ABNORMAL HIGH (ref 0.61–1.24)
GFR, EST AFRICAN AMERICAN: 42 mL/min — AB (ref >60)
GFR, EST NON AFRICAN AMERICAN: 37 mL/min — AB (ref >60)
Glucose, Bld: 267 mg/dL — ABNORMAL HIGH (ref 65–99)
Potassium: 5 mmol/L (ref 3.5–5.1)
SODIUM: 139 mmol/L (ref 135–145)

## 2015-09-06 LAB — GLUCOSE, CAPILLARY
GLUCOSE-CAPILLARY: 72 mg/dL (ref 65–99)
GLUCOSE-CAPILLARY: 209 mg/dL — AB (ref 65–99)
Glucose-Capillary: 171 mg/dL — ABNORMAL HIGH (ref 65–99)

## 2015-09-06 MED ORDER — ISOSORBIDE MONONITRATE ER 30 MG PO TB24: 15.0000 mg | ORAL_TABLET | Freq: Every day | ORAL | Status: DC

## 2015-09-06 MED ORDER — ASPIRIN 81 MG PO TBEC: 81.0000 mg | DELAYED_RELEASE_TABLET | Freq: Every day | ORAL | Status: DC

## 2015-09-06 MED ORDER — AMLODIPINE BESYLATE 5 MG PO TABS
5.0000 mg | ORAL_TABLET | Freq: Every day | ORAL | Status: DC
  Administered 2015-09-06: 5 mg via ORAL
  Filled 2015-09-06: qty 1

## 2015-09-06 MED ORDER — CLINDAMYCIN HCL 300 MG PO CAPS: 300.0000 mg | ORAL_CAPSULE | Freq: Three times a day (TID) | ORAL | Status: DC

## 2015-09-06 MED ORDER — AMLODIPINE BESYLATE 5 MG PO TABS: 5.0000 mg | ORAL_TABLET | Freq: Every day | ORAL | Status: DC

## 2015-09-06 MED ORDER — CARVEDILOL 25 MG PO TABS: 25.0000 mg | ORAL_TABLET | Freq: Two times a day (BID) | ORAL | Status: DC

## 2015-09-06 NOTE — Consult Note (Signed)
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 male.  HPI: Mr. Wittich is a 45yo AAM with PMH sig for poorly controlled HTN and DM as well as CKD stage 3 who presented to [facility] on 09/02/15 with severe jaw pain and was noted to have a markedly elevated BP at 220/126, blood glucose of 412 with normal anion gap and a creatinine of 2.5, above his baseline.  He admits to not taking any of his medications prior to presentation and occasionally will not take any medications for unclear reasons.  After his medications were restarted he had improvement of his BP and Scr.  The trend in Scr is seen below.  His lisinopril was held as well as his metformin.  We were asked to help evaluate his AKI/CKD.   Trend in Creatinine: CREATININE, SER  Date/Time Value Ref Range Status  09/06/2015 10:50 AM 2.13* 0.61 - 1.24 mg/dL Final  09/05/2015 03:43 AM 2.62* 0.61 - 1.24 mg/dL Final  09/04/2015 03:58 AM 2.41* 0.61 - 1.24 mg/dL Final  09/03/2015 03:22 AM 2.55* 0.61 - 1.24 mg/dL Final  09/02/2015 05:33 AM 2.56* 0.61 - 1.24 mg/dL Final  03/18/2015 07:30 AM 2.00* 0.61 - 1.24 mg/dL Final  03/29/2014 04:00 PM 1.50* 0.50 - 1.35 mg/dL Final  01/02/2013 04:27 PM 1.4 0.4 - 1.5 mg/dL Final  12/23/2012 05:00 AM 1.71* 0.50 - 1.35 mg/dL Final  12/22/2012 07:45 AM 1.56* 0.50 - 1.35 mg/dL Final  12/22/2012 01:28 AM 1.68* 0.50 - 1.35 mg/dL Final  04/27/2009 03:31 PM 1.0 0.4-1.5 mg/dL Final    PMH:   Past Medical History  Diagnosis Date  . ABSCESS, TOOTH 04/23/2009  . Esophageal reflux 04/27/2009  . FATIGUE 04/27/2009  . HYPERLIPIDEMIA 04/24/2007  . HYPERTENSION 04/24/2007  . INSOMNIA-SLEEP DISORDER-UNSPEC 04/27/2009  . LUMBAR STRAIN, ACUTE 12/11/2008  . RASH-NONVESICULAR 03/11/2008  . Shortness of breath     "lying down and w/exertion right now" (12/22/2012)  . DIABETES MELLITUS, TYPE I 07/30/2007  . CHF (congestive heart failure) ([place])   . AKI (acute kidney injury)  ([place]) 08/2015    PSH:   Past Surgical History  Procedure Laterality Date  . Mouth surgery      Allergies: No Known Allergies  Medications:   Prior to Admission medications   Medication Sig Start Date End Date Taking? Authorizing Provider  atorvastatin (LIPITOR) 40 MG tablet Take 40 mg by mouth daily at 6 PM.   Yes Historical Provider, MD  furosemide (LASIX) 40 MG tablet Take 1 tablet (40 mg total) by mouth daily. 04/13/15  Yes Skeet Latch, MD  HYDROcodone-homatropine [facility]) 5-1.5 MG/5ML syrup Take 5 mLs by mouth every 6 (six) hours as needed for cough. 08/24/15  Yes Biagio Borg, MD  insulin NPH-regular Human (NOVOLIN 70/30) (70-30) 100 UNIT/ML injection Inject 30-35 Units into the skin 2 (two) times daily with a meal. 35 units in the morning, 30 units in the evening   Yes Historical Provider, MD  levofloxacin (LEVAQUIN) 500 MG tablet Take 1 tablet (500 mg total) by mouth daily. 08/24/15  Yes Biagio Borg, MD  lisinopril-hydrochlorothiazide (PRINZIDE,ZESTORETIC) 20-12.5 MG per tablet Take 2 tablets by mouth daily.     Yes Historical Provider, MD  metFORMIN (GLUCOPHAGE) 500 MG tablet Take 250 mg by mouth 2 (two) times daily.    Yes Historical Provider, MD  metoprolol succinate (TOPROL-XL) 50 MG 24 hr tablet Take 1 tablet (50 mg total) by mouth daily. Take  with or immediately following a meal. 07/22/15  Yes Biagio Borg, MD  omeprazole (PRILOSEC) 20 MG capsule Take 1 capsule (20 mg total) by mouth 2 (two) times daily before a meal. 07/22/15  Yes Biagio Borg, MD  amLODipine (NORVASC) 5 MG tablet Take 1 tablet (5 mg total) by mouth daily. 09/06/15   Nita Sells, MD  aspirin EC 81 MG EC tablet Take 1 tablet (81 mg total) by mouth daily. 09/06/15   Nita Sells, MD  carvedilol (COREG) 25 MG tablet Take 1 tablet (25 mg total) by mouth 2 (two) times daily. 09/06/15   Nita Sells, MD  clindamycin (CLEOCIN) 300 MG capsule Take 1 capsule (300 mg total) by mouth every 8 (eight)  hours. 09/06/15   Nita Sells, MD  isosorbide mononitrate (IMDUR) 30 MG 24 hr tablet Take 0.5 tablets (15 mg total) by mouth daily. 09/06/15   Nita Sells, MD    Inpatient medications: . amLODipine  5 mg Oral Daily  . aspirin EC  81 mg Oral Daily  . atorvastatin  40 mg Oral q1800  . calcium carbonate  400 mg of elemental calcium Oral TID  . carvedilol  25 mg Oral BID  . clindamycin  300 mg Oral 3 times per day  . enoxaparin (LOVENOX) injection  40 mg Subcutaneous Q24H  . fluticasone  2 spray Each Nare Daily  . insulin aspart  0-15 Units Subcutaneous TID WC  . insulin aspart  0-5 Units Subcutaneous QHS  . insulin aspart protamine- aspart  30 Units Subcutaneous Q breakfast  . insulin aspart protamine- aspart  35 Units Subcutaneous Q supper  . isosorbide mononitrate  15 mg Oral Daily  . living well with diabetes book   Does not apply Once  . pantoprazole  40 mg Oral Daily  . sodium chloride  3 mL Intravenous Q12H    Discontinued Meds:   Medications Discontinued During This Encounter  Medication Reason  . sodium chloride 0.9 % bolus 500 mL   . aspirin EC 81 MG EC tablet Patient Preference  . insulin aspart protamine- aspart (NOVOLOG MIX 70/30) injection 30 Units   . hydrALAZINE (APRESOLINE) tablet 25 mg   . carvedilol (COREG) 25 MG tablet     Social History:  reports that he quit smoking about 4 months ago. His smoking use included Cigarettes. He has never used smokeless tobacco. He reports that he drinks about 1.2 oz of alcohol per week. He reports that he does not use illicit drugs.  Family History:   Family History  Problem Relation Age of Onset  . Hypertension Mother     A comprehensive review of systems was negative except for: Ears, nose, mouth, throat, and face: positive for jaw pain Weight change:   Intake/Output Summary (Last 24 hours) at 09/06/15 1404 Last data filed at 09/06/15 0835  Gross per 24 hour  Intake   1820 ml  Output      3 ml  Net    1817 ml   BP 174/105 mmHg  Pulse 90  Temp(Src) 98.2 F (36.8 C) (Oral)  Resp 19  Ht 5\' 6"  (1.676 m)  Wt 96.48 kg (212 lb 11.2 oz)  BMI 34.35 kg/m2  SpO2 100% Filed Vitals:   09/05/15 2121 09/06/15 0632 09/06/15 1104 09/06/15 1229  BP: 163/95 155/96 184/103 174/105  Pulse: 86 90  90  Temp: 97.9 F (36.6 C) 97.9 F (36.6 C)  98.2 F (36.8 C)  TempSrc: Oral Oral  Oral  Resp:  18 18  19   Height:      Weight:  96.48 kg (212 lb 11.2 oz)    SpO2: 100% 97%  100%     General appearance: alert, cooperative and no distress Head: Normocephalic, without obvious abnormality, atraumatic Eyes: negative findings: lids and lashes normal, conjunctivae and sclerae normal and corneas clear Resp: clear to auscultation bilaterally Cardio: no rub Extremities: extremities normal, atraumatic, no cyanosis or edema  Labs: Basic Metabolic Panel:  Recent Labs Lab 09/02/15 0533 09/03/15 0322 09/04/15 0358 09/05/15 0343 09/06/15 1050  NA 135 140 139 140 139  K 4.7 4.5 4.1 4.4 5.0  CL 102 108 112* 113* 109  CO2 23 24 21* 22 21*  GLUCOSE 412* 131* 182* 119* 267*  BUN 39* 34* 33* 33* 25*  CREATININE 2.56* 2.55* 2.41* 2.62* 2.13*  CALCIUM 9.3 9.0 8.6* 8.6* 8.9   Liver Function Tests: No results for input(s): AST, ALT, ALKPHOS, BILITOT, PROT, ALBUMIN in the last 168 hours. No results for input(s): LIPASE, AMYLASE in the last 168 hours. No results for input(s): AMMONIA in the last 168 hours. CBC:  Recent Labs Lab 09/02/15 0533 09/04/15 0358  WBC 11.7* 8.2  NEUTROABS 9.1*  --   HGB 12.7* 11.1*  HCT 39.0 34.5*  MCV 78.6 78.4  PLT 237 219   PT/INR: @LABRCNTIP (inr:5) Cardiac Enzymes: )No results for input(s): CKTOTAL, CKMB, CKMBINDEX, TROPONINI in the last 168 hours. CBG:  Recent Labs Lab 09/05/15 1722 09/05/15 2115 09/06/15 0409 09/06/15 0631 09/06/15 1123  GLUCAP 222* 148* 72 171* 209*    Iron Studies: No results for input(s): IRON, TIBC, TRANSFERRIN, FERRITIN in the last  168 hours.  Xrays/Other Studies: US Renal  09/05/2015  CLINICAL DATA:  Patient with acute renal failure. EXAM: RENAL / URINARY TRACT ULTRASOUND COMPLETE COMPARISON:  None. FINDINGS: Right Kidney: Length: 11.3 cm. Normal renal cortical thickness. Increased renal cortical echogenicity. No hydronephrosis. Left Kidney: Length: 10.7 cm. Normal renal cortical thickness. Increased renal cortical echogenicity. No hydronephrosis. Bladder: Appears normal for degree of bladder distention. IMPRESSION: Increased renal cortical echogenicity bilaterally as can be seen with chronic medical renal disease. No hydronephrosis. Electronically Signed   By: Lovey Newcomer M.D.   On: 09/05/2015 11:36    Assessment/Plan: 1.  AKI/CKD stage 3- likely due to use of NSAIDs for jaw pain with ACE and malignant HTN.  Underlying CKD stage 3 due to poorly controlled DM and HTN.  Discussed the ways to delay the progression of CKD with tight BP control, tight glucose control, and stressed the importance of compliance with medications and avoiding NSAIDs and Cox-II I's.  He is being discharged today and will arrange for outpatient follow up in our office over the next 2-4 weeks.  In the meantime, agree with holding lisinopril and metformin.  F/u with Dr. Judi Cong. 2. Molar abscess- source of jaw pain.  To follow up with dentist as an outpatient and continue clindamycin per Dr. Verlon Au 3. DM type 2 poorly controlled.  Stressed the importance of compliance with insulin and f/u with PCP 4. Hypertensive urgency due to noncompliance with medications.  Agree with amlodpine and holding ACE for now.  F/u with Dr. Judi Cong  5. Chronic diastolic CHF- currently euvolemic.   Governor Rooks Aniket Paye 09/06/2015, 2:04 PM

## 2015-09-06 NOTE — Discharge Summary (Signed)
Physician Discharge Summary  Hector Mahoney [id number] DOB: [birth year] DOA: 09/02/2015  PCP: Cathlean Cower, MD  Admit date: 09/02/2015 Discharge date: 09/06/2015  Time spent: 35 minutes  Recommendations for Outpatient Follow-up:  1. Needs bmet as OP 1 week 2. follow with nephrology as Op   Discharge Diagnoses:  Principal Problem:   Acute renal failure (ARF) ([place]) Active Problems:   Hyperlipidemia   Hypertensive urgency   Chronic diastolic heart failure, NYHA class 3 (HCC)   Diabetes mellitus, insulin dependent (IDDM), uncontrolled ([place])   Dehydration   Patient nonadherence   CKD (chronic kidney disease), stage III   Discharge Condition: hh low salt  Diet recommendation: heart healthy dm  Filed Weights   09/02/15 1309 09/03/15 0421 09/06/15 [id number]  Weight: 92.534 kg (204 lb) 92.761 kg (204 lb 8 oz) 96.48 kg (212 lb 11.2 oz)    History of present illness:  45 y/o ? htn DM ty II-not compliant on meds HFprEF grade III DD 2014 Known Caries and yet to see a dentist  Admitted to [facility] with HTn urgency Found to have AKI bun /Creat 22/1.58--->33/2.5  panorex L lower molar abcess  Hospital Course:  Assessment/Plan:  1. Htn urgency-initial pressures 221/126-still not controlled but better. Cont coreg 25 bid. Held Prinzide 20-12.5 given aki. Added imdur 15 daily. and on d/c added amlodipine 5 daily. Blood pressures are mod controlled 2. Dental abscess clindamycin 300 tid till sees dentist-give 10 days Rx on d/c 3. DM ty II-continue 70/30 30 am and 35 pm insulin . continue SSI supplementation as well. cbg 201-245. Compliance enforced. 4. AKI-2/2 to DM and HTN with superimposed AKI. Saline 50 -->one hundred ccpr. FeNa shows Intrinsic renal issues . US kidney no specific obstruction but medical. nephro input as op. Rpt bmet am 5. Chr diastolic HF, ef [id number] currently. D/c lasix on d/c   Discharge Exam: Filed Vitals:   09/06/15 0632 09/06/15 1104   BP: 155/96 184/103  Pulse: 90   Temp: 97.9 F (36.6 C)   Resp: 18     General: alert pleasant with no specific issue Cardiovascular: s1 s2 no m/r/g Respiratory:  intact  Discharge Instructions   Discharge Instructions    Ambulatory referral to Nutrition and Diabetic Education    Complete by:  As directed      Diet - low sodium heart healthy    Complete by:  As directed      Discharge instructions    Complete by:  As directed   We will change you medications as per the list-look at the list carefully Do take your insulin twice a day without fail STOP metformin Please follow up with Nephrology Doctor.  We will give you their address and you will need to follow with them as an op.     Increase activity slowly    Complete by:  As directed           Current Discharge Medication List    START taking these medications   Details  amLODipine (NORVASC) 5 MG tablet Take 1 tablet (5 mg total) by mouth daily. Qty: 30 tablet, Refills: 0    clindamycin (CLEOCIN) 300 MG capsule Take 1 capsule (300 mg total) by mouth every 8 (eight) hours. Qty: 30 capsule, Refills: 0    isosorbide mononitrate (IMDUR) 30 MG 24 hr tablet Take 0.5 tablets (15 mg total) by mouth daily. Qty: 30 tablet, Refills: 0      CONTINUE these medications which have CHANGED  Details  aspirin EC 81 MG EC tablet Take 1 tablet (81 mg total) by mouth daily. Qty: 30 tablet, Refills: 0    carvedilol (COREG) 25 MG tablet Take 1 tablet (25 mg total) by mouth 2 (two) times daily. Qty: 60 tablet, Refills: 11      CONTINUE these medications which have NOT CHANGED   Details  atorvastatin (LIPITOR) 40 MG tablet Take 40 mg by mouth daily at 6 PM.    HYDROcodone-homatropine (HYCODAN) 5-1.5 MG/5ML syrup Take 5 mLs by mouth every 6 (six) hours as needed for cough. Qty: 180 mL, Refills: 0    insulin NPH-regular Human (NOVOLIN 70/30) (70-30) 100 UNIT/ML injection Inject 30-35 Units into the skin 2 (two) times daily with a  meal. 35 units in the morning, 30 units in the evening    metFORMIN (GLUCOPHAGE) 500 MG tablet Take 250 mg by mouth 2 (two) times daily.       STOP taking these medications     furosemide (LASIX) 40 MG tablet      levofloxacin (LEVAQUIN) 500 MG tablet      lisinopril-hydrochlorothiazide (PRINZIDE,ZESTORETIC) 20-12.5 MG per tablet      metoprolol succinate (TOPROL-XL) 50 MG 24 hr tablet      omeprazole (PRILOSEC) 20 MG capsule        No Known Allergies    The results of significant diagnostics from this hospitalization (including imaging, microbiology, ancillary and laboratory) are listed below for reference.    Significant Diagnostic Studies: Dg Orthopantogram  09/02/2015  CLINICAL DATA:  Upper left dental pain EXAM: ORTHOPANTOGRAM/PANORAMIC COMPARISON:  None. FINDINGS: Periapical abscess noted around the sole remaining left lower molar. Dental caries noted, most pronounced in a right upper molar. No mandibular fracture. IMPRESSION: Periapical abscess around the sole may need left lower molar. Dental caries. Electronically Signed   By: Rolm Baptise M.D.   On: 09/02/2015 14:07   Dg Chest 2 View  09/02/2015  CLINICAL DATA:  45 year old male with history of CHF and hypertensive headache. EXAM: CHEST  2 VIEW COMPARISON:  Radiograph dated 03/18/2015 FINDINGS: The heart size and mediastinal contours are within normal limits. Both lungs are clear. The visualized skeletal structures are unremarkable. IMPRESSION: No active cardiopulmonary disease. Electronically Signed   By: Anner Crete M.D.   On: 09/02/2015 06:34   Ct Head Wo Contrast  09/02/2015  CLINICAL DATA:  45 year old male with hypertension and blurred vision EXAM: CT HEAD WITHOUT CONTRAST TECHNIQUE: Contiguous axial images were obtained from the base of the skull through the vertex without intravenous contrast. COMPARISON:  Brain MRI dated 02/28/2007 FINDINGS: The ventricles and the sulci are appropriate in size for the  patient's age. There is no intracranial hemorrhage. No midline shift or mass effect identified. The gray-white matter differentiation is preserved. The visualized paranasal sinuses and mastoid air cells are well aerated. The calvarium is intact. IMPRESSION: No acute intracranial pathology. Electronically Signed   By: Anner Crete M.D.   On: 09/02/2015 06:46   US Renal  09/05/2015  CLINICAL DATA:  Patient with acute renal failure. EXAM: RENAL / URINARY TRACT ULTRASOUND COMPLETE COMPARISON:  None. FINDINGS: Right Kidney: Length: 11.3 cm. Normal renal cortical thickness. Increased renal cortical echogenicity. No hydronephrosis. Left Kidney: Length: 10.7 cm. Normal renal cortical thickness. Increased renal cortical echogenicity. No hydronephrosis. Bladder: Appears normal for degree of bladder distention. IMPRESSION: Increased renal cortical echogenicity bilaterally as can be seen with chronic medical renal disease. No hydronephrosis. Electronically Signed   By: Lovey Newcomer  M.D.   On: 09/05/2015 11:36    Microbiology: No results found for this or any previous visit (from the past 240 hour(s)).   Labs: Basic Metabolic Panel:  Recent Labs Lab 09/02/15 0533 09/03/15 0322 09/04/15 0358 09/05/15 0343 09/06/15 1050  NA 135 140 139 140 139  K 4.7 4.5 4.1 4.4 5.0  CL 102 108 112* 113* 109  CO2 23 24 21* 22 21*  GLUCOSE 412* 131* 182* 119* 267*  BUN 39* 34* 33* 33* 25*  CREATININE 2.56* 2.55* 2.41* 2.62* 2.13*  CALCIUM 9.3 9.0 8.6* 8.6* 8.9   Liver Function Tests: No results for input(s): AST, ALT, ALKPHOS, BILITOT, PROT, ALBUMIN in the last 168 hours. No results for input(s): LIPASE, AMYLASE in the last 168 hours. No results for input(s): AMMONIA in the last 168 hours. CBC:  Recent Labs Lab 09/02/15 0533 09/04/15 0358  WBC 11.7* 8.2  NEUTROABS 9.1*  --   HGB 12.7* 11.1*  HCT 39.0 34.5*  MCV 78.6 78.4  PLT 237 219   Cardiac Enzymes: No results for input(s): CKTOTAL, CKMB,  CKMBINDEX, TROPONINI in the last 168 hours. BNP: BNP (last 3 results)  Recent Labs  03/18/15 0730  BNP 375.1*    ProBNP (last 3 results) No results for input(s): PROBNP in the last 8760 hours.  CBG:  Recent Labs Lab 09/05/15 1722 09/05/15 2115 09/06/15 0409 09/06/15 0631 09/06/15 1123  GLUCAP 222* 148* 72 171* 209*       Signed:  Nita Sells MD.  [facility] 09/06/2015, 12:09 PM

## 2015-09-06 NOTE — Progress Notes (Signed)
09/06/2015 2:01 PM Discharge AVS meds taken today and those due this evening reviewed.  Follow-up appointments and when to call md reviewed.  D/C IV and TELE.  Questions and concerns addressed.   D/C home per orders.[place]

## 2015-09-08 ENCOUNTER — Encounter: Payer: Self-pay | Admitting: Internal Medicine

## 2015-09-08 ENCOUNTER — Other Ambulatory Visit (INDEPENDENT_AMBULATORY_CARE_PROVIDER_SITE_OTHER): Payer: Federal, State, Local not specified - PPO

## 2015-09-08 ENCOUNTER — Ambulatory Visit (INDEPENDENT_AMBULATORY_CARE_PROVIDER_SITE_OTHER): Payer: Federal, State, Local not specified - PPO | Admitting: Internal Medicine

## 2015-09-08 VITALS — BP 142/88 | HR 85 | Temp 98.0°F | Resp 20 | Wt 209.5 lb

## 2015-09-08 DIAGNOSIS — N179 Acute kidney failure, unspecified: Secondary | ICD-10-CM | POA: Diagnosis not present

## 2015-09-08 DIAGNOSIS — K219 Gastro-esophageal reflux disease without esophagitis: Secondary | ICD-10-CM

## 2015-09-08 DIAGNOSIS — E1140 Type 2 diabetes mellitus with diabetic neuropathy, unspecified: Secondary | ICD-10-CM

## 2015-09-08 DIAGNOSIS — I5032 Chronic diastolic (congestive) heart failure: Secondary | ICD-10-CM | POA: Diagnosis not present

## 2015-09-08 NOTE — Progress Notes (Signed)
Pre visit review using our clinic review tool, if applicable. No additional management support is needed unless otherwise documented below in the visit note. 

## 2015-09-08 NOTE — Patient Instructions (Signed)
Please continue all other medications as before, and refills have been done if requested.  Please have the pharmacy call with any other refills you may need.  Please continue your efforts at being more active, low cholesterol diet, and weight control.  Please keep your appointments with your specialists as you may have planned  You will be contacted regarding the referral for: Dr Mercy Moore  Please go to the LAB in the Basement (turn left off the elevator) for the tests to be done today  You will be contacted by phone if any changes need to be made immediately.  Otherwise, you will receive a letter about your results with an explanation, but please check with MyChart first.  Please remember to sign up for MyChart if you have not done so, as this will be important to you in the future with finding out test results, communicating by private email, and scheduling acute appointments online when needed.

## 2015-09-08 NOTE — Progress Notes (Signed)
Subjective:    Patient ID: Hector Mahoney, male    DOB: 1971/03/21, 45 y.o.   MRN: [id number]  HPI  Here to f/u post hospn after recent noncompliance with taking his meds ("I was just doing something stupid"); addmitted with HTN urgency, dental abcess, severe elev BS with dehydration on lasix for diast CHF.  Coreg increased, norvasc/imdur added, and lasxi/levaquin/prinzide/toprol/prilosec stopped.  Also tx with cleocin for dental.  Admit for 4 days, d/c on jan 24, has done well at home. Remains compliant with current meds, and has contd antibx, no further dental pain or swelling.   Pt denies chest pain, increasing sob or doe, wheezing, orthopnea, PND, increased LE swelling, palpitations, dizziness or syncope.  Pt denies new neurological symptoms such as new headache, or facial or extremity weakness or numbness.  Pt denies polydipsia, polyuria, or low sugar episode.   Pt denies new neurological symptoms such as new headache, or facial or extremity weakness or numbness. Pt does feel he may be retaining some fluid though has not seen edema, and wt not increased overall post d/c.  CBG's in 100's Wt Readings from Last 3 Encounters:  09/08/15 209 lb 8 oz (95.029 kg)  09/06/15 212 lb 11.2 oz (96.48 kg)  08/24/15 211 lb (95.709 kg)   Past Medical History  Diagnosis Date  . ABSCESS, TOOTH 04/23/2009  . Esophageal reflux 04/27/2009  . FATIGUE 04/27/2009  . HYPERLIPIDEMIA 04/24/2007  . HYPERTENSION 04/24/2007  . INSOMNIA-SLEEP DISORDER-UNSPEC 04/27/2009  . LUMBAR STRAIN, ACUTE 12/11/2008  . RASH-NONVESICULAR 03/11/2008  . Shortness of breath     "lying down and w/exertion right now" (12/22/2012)  . DIABETES MELLITUS, TYPE I 07/30/2007  . CHF (congestive heart failure) ([place])   . AKI (acute kidney injury) ([place]) 08/2015   Past Surgical History  Procedure Laterality Date  . Mouth surgery      reports that he quit smoking about 4 months ago. His smoking use included Cigarettes. He has never used smokeless  tobacco. He reports that he drinks about 1.2 oz of alcohol per week. He reports that he does not use illicit drugs. family history includes Hypertension in his mother. No Known Allergies Current Outpatient Prescriptions on File Prior to Visit  Medication Sig Dispense Refill  . amLODipine (NORVASC) 5 MG tablet Take 1 tablet (5 mg total) by mouth daily. 30 tablet 0  . aspirin EC 81 MG EC tablet Take 1 tablet (81 mg total) by mouth daily. 30 tablet 0  . atorvastatin (LIPITOR) 40 MG tablet Take 40 mg by mouth daily at 6 PM.    . carvedilol (COREG) 25 MG tablet Take 1 tablet (25 mg total) by mouth 2 (two) times daily. 60 tablet 11  . clindamycin (CLEOCIN) 300 MG capsule Take 1 capsule (300 mg total) by mouth every 8 (eight) hours. 30 capsule 0  . HYDROcodone-homatropine (HYCODAN) 5-1.5 MG/5ML syrup Take 5 mLs by mouth every 6 (six) hours as needed for cough. 180 mL 0  . insulin NPH-regular Human (NOVOLIN 70/30) (70-30) 100 UNIT/ML injection Inject 30-35 Units into the skin 2 (two) times daily with a meal. 35 units in the morning, 30 units in the evening    . isosorbide mononitrate (IMDUR) 30 MG 24 hr tablet Take 0.5 tablets (15 mg total) by mouth daily. 30 tablet 0  . metFORMIN (GLUCOPHAGE) 500 MG tablet Take 250 mg by mouth 2 (two) times daily.      No current facility-administered medications on file prior to visit.  Review of Systems  Constitutional: Negative for unusual diaphoresis or night sweats HENT: Negative for ringing in ear or discharge Eyes: Negative for double vision or worsening visual disturbance.  Respiratory: Negative for choking and stridor.   Gastrointestinal: Negative for vomiting or other signifcant bowel change Genitourinary: Negative for hematuria or change in urine volume.  Musculoskeletal: Negative for other MSK pain or swelling Skin: Negative for color change and worsening wound.  Neurological: Negative for tremors and numbness other than noted    Psychiatric/Behavioral: Negative for decreased concentration or agitation other than above       Objective:   Physical Exam BP 142/88 mmHg  Pulse 85  Temp(Src) 98 F (36.7 C) (Oral)  Resp 20  Wt 209 lb 8 oz (95.029 kg)  SpO2 99% VS noted, not ill appearing Constitutional: Pt appears in no significant distress HENT: Head: NCAT.  Right Ear: External ear normal.  Left Ear: External ear normal.  Eyes: . Pupils are equal, round, and reactive to light. Conjunctivae and EOM are normal Neck: Normal range of motion. Neck supple.  Cardiovascular: Normal rate and regular rhythm.   Pulmonary/Chest: Effort normal and breath sounds without rales or wheezing.  Abd:  Soft, NT, ND, + BS Neurological: Pt is alert. Not confused , motor grossly intact Skin: Skin is warm. No rash, no LE edema Psychiatric: Pt behavior is normal. No agitation.     Assessment & Plan:

## 2015-09-09 ENCOUNTER — Encounter: Payer: Self-pay | Admitting: Internal Medicine

## 2015-09-09 LAB — BASIC METABOLIC PANEL
BUN: 33 mg/dL — ABNORMAL HIGH (ref 6–23)
CALCIUM: 9.2 mg/dL (ref 8.4–10.5)
CO2: 26 meq/L (ref 19–32)
CREATININE: 2.36 mg/dL — AB (ref 0.40–1.50)
Chloride: 107 mEq/L (ref 96–112)
GFR: 38.98 mL/min — ABNORMAL LOW (ref >60.00)
GLUCOSE: 114 mg/dL — AB (ref 70–99)
Potassium: 4.9 mEq/L (ref 3.5–5.1)
Sodium: 138 mEq/L (ref 135–145)

## 2015-09-10 NOTE — Assessment & Plan Note (Signed)
Pt preference is to remain followed by endo at the [place], declines referral locally, to cont same tx for now, [facility]

## 2015-09-10 NOTE — Assessment & Plan Note (Signed)
stable overall by history and exam, recent data reviewed with pt, and pt to continue medical treatment as before,  to f/u any worsening symptoms or concerns Lab Results  Component Value Date   CREATININE 2.36* 09/08/2015   For f/u nephrology as planned

## 2015-09-10 NOTE — Assessment & Plan Note (Signed)
Stable exam for now, ok to resume lasix PRN only for wt increased over 5 lbs or onset edema

## 2015-09-10 NOTE — Addendum Note (Signed)
Addended by: Biagio Borg on: 09/10/2015 01:45 PM   Modules accepted: Orders

## 2015-09-10 NOTE — Assessment & Plan Note (Signed)
PPI stopped,asympt, ok to follow for now

## 2015-09-30 ENCOUNTER — Encounter: Payer: Federal, State, Local not specified - PPO | Attending: Internal Medicine | Admitting: Skilled Nursing Facility1

## 2015-09-30 ENCOUNTER — Encounter: Payer: Self-pay | Admitting: Skilled Nursing Facility1

## 2015-09-30 VITALS — Ht 66.0 in | Wt 206.0 lb

## 2015-09-30 DIAGNOSIS — N183 Chronic kidney disease, stage 3 (moderate): Secondary | ICD-10-CM | POA: Diagnosis not present

## 2015-09-30 DIAGNOSIS — Z9119 Patient's noncompliance with other medical treatment and regimen: Secondary | ICD-10-CM | POA: Diagnosis present

## 2015-09-30 DIAGNOSIS — R739 Hyperglycemia, unspecified: Secondary | ICD-10-CM | POA: Diagnosis present

## 2015-09-30 DIAGNOSIS — I160 Hypertensive urgency: Secondary | ICD-10-CM | POA: Insufficient documentation

## 2015-09-30 DIAGNOSIS — E119 Type 2 diabetes mellitus without complications: Secondary | ICD-10-CM

## 2015-09-30 NOTE — Patient Instructions (Signed)
-  Drink more water -Check your blood glucose at different times including 2 hours after you eat -Look for less than 300 mg of sodium  -Look for -phate in ingredients labels including Mrs. Dash -One carbohydrate choice=about 15 grams of carbohydrate (you get about 3 per meal and 2 per snack) -Stay away from juice for now
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
## 2015-11-07 ENCOUNTER — Encounter: Payer: Federal, State, Local not specified - PPO | Attending: Internal Medicine | Admitting: Skilled Nursing Facility1

## 2015-11-07 ENCOUNTER — Encounter: Payer: Self-pay | Admitting: Skilled Nursing Facility1

## 2015-11-07 VITALS — Ht 66.0 in | Wt 209.0 lb

## 2015-11-07 DIAGNOSIS — R739 Hyperglycemia, unspecified: Secondary | ICD-10-CM | POA: Diagnosis present

## 2015-11-07 DIAGNOSIS — Z9119 Patient's noncompliance with other medical treatment and regimen: Secondary | ICD-10-CM | POA: Diagnosis present

## 2015-11-07 DIAGNOSIS — I160 Hypertensive urgency: Secondary | ICD-10-CM | POA: Diagnosis present

## 2015-11-07 DIAGNOSIS — E119 Type 2 diabetes mellitus without complications: Secondary | ICD-10-CM

## 2015-11-07 DIAGNOSIS — N183 Chronic kidney disease, stage 3 (moderate): Secondary | ICD-10-CM | POA: Insufficient documentation

## 2015-11-07 NOTE — Patient Instructions (Signed)
-  Try to cut back your fast food meals by one a week with the eventual goal being no more than one total for the week -Keep up with trying to limit juice -Be sure to have every meal and snack you eat balanced; looking at your meat portion -Be sure to eat carbohydrates with a snack or with a meal

## 2015-11-07 NOTE — Progress Notes (Signed)
Diabetes Self-Management Education  Visit Type:  Follow-up  Appt. Start Time: 9:00 Appt. End Time: 10:00  11/07/2015  [name], identified by name and date of birth, is a 45 y.o. male with a diagnosis of Diabetes: Type 2.   ASSESSMENT  Height 5\' 6"  (1.676 m), weight 209 lb (94.802 kg). Body mass index is 33.75 kg/(m^2).  Pt states his diabetes management has been Going well with a few "slip ups". Pt states he has been limiting juice but still drinks it with focus on drinking water and sugar free flavor packets. Pt was hesitant to say he has not made any food changes while talking in circles and does not want to answer the dietary recall. Pt states he is starting to take diet more seriously and has improved physical activity, have been testing blood sugar, numbers have a wide range 65-300's according to log book. Pt states he eats more at [employer] because he thinks it is is the lesser of two evils: Dietitian showed him the nutrition facts for both restaurants, Pt states his eyes have been opened and he has a lot of thinking to do.     Diabetes Self-Management Education - 11/07/15 0919    Health Coping   How would you rate your overall health? Fair  with improvement   Psychosocial Assessment   Patient Belief/Attitude about Diabetes Motivated to manage diabetes   Self-care barriers None   Complications   Fasting Blood glucose range (mg/dL) >200   Postprandial Blood glucose range (mg/dL) 130-179   Have you had a dilated eye exam in the past 12 months? No   Have you had a dental exam in the past 12 months? Yes   Are you checking your feet? Yes   How many days per week are you checking your feet? 7   Dietary Intake   Breakfast cereal----oatmeal-----breakfast sandwich   Lunch salad-----fast food   Dinner fast food   Beverage(s) water, juice   Exercise   Exercise Type Light (walking / raking leaves)   How many days per week to you exercise? 1   How many minutes per day do  you exercise? 30   Total minutes per week of exercise 30   Patient Education   Previous Diabetes Education Yes (please comment)   Individualized Goals (developed by patient)   Nutrition Follow meal plan discussed   Physical Activity Exercise 3-5 times per week   Reducing Risk examine blood glucose patterns;do foot checks daily;increase portions of nuts and seeds   Outcomes   Program Status Completed   Subsequent Visit   Since your last visit have you continued or begun to take your medications as prescribed? Yes   Since your last visit have you experienced any weight changes? Gain   Weight Gain (lbs) 3   Since your last visit, are you checking your blood glucose at least once a day? Yes      Learning Objective:  Patient will have a greater understanding of diabetes self-management. Patient education plan is to attend individual and/or group sessions per assessed needs and concerns.   Plan:   Patient Instructions  -Try to cut back your fast food meals by one a week with the eventual goal being no more than one total for the week -Keep up with trying to limit juice -Be sure to have every meal and snack you eat balanced; looking at your meat portion -Be sure to eat carbohydrates with a snack or with a meal  Expected Outcomes:  Demonstrated interest in learning. Expect positive outcomes  Education material provided: Living Well with Diabetes and Meal plan card  If problems or questions, patient to contact team via:  Phone  Future DSME appointment: - PRN

## 2015-11-23 ENCOUNTER — Ambulatory Visit (INDEPENDENT_AMBULATORY_CARE_PROVIDER_SITE_OTHER): Payer: Federal, State, Local not specified - PPO | Admitting: Nurse Practitioner

## 2015-11-23 ENCOUNTER — Encounter: Payer: Self-pay | Admitting: Nurse Practitioner

## 2015-11-23 VITALS — BP 162/118 | HR 91 | Temp 98.0°F | Ht 66.0 in | Wt 207.3 lb

## 2015-11-23 DIAGNOSIS — M940 Chondrocostal junction syndrome [Tietze]: Secondary | ICD-10-CM | POA: Diagnosis not present

## 2015-11-23 DIAGNOSIS — I160 Hypertensive urgency: Secondary | ICD-10-CM | POA: Diagnosis not present

## 2015-11-23 MED ORDER — CYCLOBENZAPRINE HCL 5 MG PO TABS: 5.0000 mg | ORAL_TABLET | Freq: Every day | ORAL | Status: DC

## 2015-11-23 MED ORDER — ISOSORBIDE MONONITRATE ER 30 MG PO TB24: 15.0000 mg | ORAL_TABLET | Freq: Every day | ORAL | Status: DC

## 2015-11-23 NOTE — Patient Instructions (Signed)

## 2015-11-23 NOTE — Progress Notes (Signed)
Patient ID: Hector Mahoney, male    DOB: [birth year]  Age: 45 y.o. MRN: [id number]  CC: Chest Pain   HPI Hector Mahoney presents for CC of rib pain on left x 3 weeks.  1) Onset- 3 weeks ago Location- left ribs anterior  Duration- intermittent, but more consistent lately Characteristics- sharp Aggravating factors- lying on both sides in bed  Relieving factors- denies Severity- Mild-moderate   He was pulling a lawnmower up a steep hill when this occurred.  no treatment to date  2) blood pressure uncontrolled- Out of imdur- last table on Monday  Taking others regularly   BS running 140 later it was 175    History Hector Mahoney has a past medical history of ABSCESS, TOOTH (04/23/2009); Esophageal reflux (04/27/2009); FATIGUE (04/27/2009); HYPERLIPIDEMIA (04/24/2007); HYPERTENSION (04/24/2007); INSOMNIA-SLEEP DISORDER-UNSPEC (04/27/2009); LUMBAR STRAIN, ACUTE (12/11/2008); RASH-NONVESICULAR (03/11/2008); Shortness of breath; DIABETES MELLITUS, TYPE I (07/30/2007); CHF (congestive heart failure) ([place]); and AKI (acute kidney injury) ([facility]) (08/2015).   He has past surgical history that includes Mouth surgery.   His family history includes Hypertension in his mother.He reports that he quit smoking about 7 months ago. His smoking use included Cigarettes. He has never used smokeless tobacco. He reports that he drinks about 1.2 oz of alcohol per week. He reports that he does not use illicit drugs.  Outpatient Prescriptions Prior to Visit  Medication Sig Dispense Refill  . amLODipine (NORVASC) 5 MG tablet Take 1 tablet (5 mg total) by mouth daily. 30 tablet 0  . aspirin EC 81 MG EC tablet Take 1 tablet (81 mg total) by mouth daily. 30 tablet 0  . atorvastatin (LIPITOR) 40 MG tablet Take 40 mg by mouth daily at 6 PM.    . carvedilol (COREG) 25 MG tablet Take 1 tablet (25 mg total) by mouth 2 (two) times daily. 60 tablet 11  . furosemide (LASIX) 10 MG/ML solution Take by mouth daily.    . insulin  NPH-regular Human (NOVOLIN 70/30) (70-30) 100 UNIT/ML injection Inject 30-35 Units into the skin 2 (two) times daily with a meal. 35 units in the morning, 30 units in the evening    . clindamycin (CLEOCIN) 300 MG capsule Take 1 capsule (300 mg total) by mouth every 8 (eight) hours. 30 capsule 0  . HYDROcodone-homatropine (HYCODAN) 5-1.5 MG/5ML syrup Take 5 mLs by mouth every 6 (six) hours as needed for cough. 180 mL 0  . isosorbide mononitrate (IMDUR) 30 MG 24 hr tablet Take 0.5 tablets (15 mg total) by mouth daily. 30 tablet 0  . metFORMIN (GLUCOPHAGE) 500 MG tablet Take 250 mg by mouth 2 (two) times daily. Reported on 09/30/2015     No facility-administered medications prior to visit.    ROS Review of Systems  Constitutional: Negative for fever, chills, diaphoresis and fatigue.  Eyes: Negative for visual disturbance.  Respiratory: Negative for chest tightness, shortness of breath and wheezing.   Cardiovascular: Negative for chest pain, palpitations and leg swelling.  Gastrointestinal: Negative for nausea, vomiting and diarrhea.  Musculoskeletal: Positive for arthralgias.  Skin: Negative for rash.  Neurological: Negative for dizziness, weakness and numbness.  Psychiatric/Behavioral: The patient is not nervous/anxious.    Objective:  BP 162/118 mmHg  Pulse 91  Temp(Src) 98 F (36.7 C) (Oral)  Ht 5\' 6"  (1.676 m)  Wt 207 lb 5 oz (94.036 kg)  BMI 33.48 kg/m2  SpO2 98%   Physical Exam  Constitutional: He is oriented to person, place, and time. He appears well-developed and well-nourished.  No distress.  HENT:  Head: Normocephalic and atraumatic.  Right Ear: External ear normal.  Left Ear: External ear normal.  Eyes: Right eye exhibits no discharge. Left eye exhibits no discharge. No scleral icterus.  Cardiovascular: Normal rate and regular rhythm.   Pulmonary/Chest: Effort normal and breath sounds normal. No respiratory distress. He has no wheezes. He has no rales. He exhibits no  tenderness.  Abdominal: Soft. Bowel sounds are normal. He exhibits no distension and no mass. There is no tenderness. There is no rebound and no guarding.  Musculoskeletal: Normal range of motion. He exhibits no edema or tenderness.  Neurological: He is alert and oriented to person, place, and time.  Skin: Skin is warm and dry. No rash noted. He is not diaphoretic.  Psychiatric: He has a normal mood and affect. His behavior is normal. Judgment and thought content normal.   Assessment & Plan:   There are no diagnoses linked to this encounter. I have discontinued [name]'s metFORMIN, HYDROcodone-homatropine, and clindamycin. I am also having him start on cyclobenzaprine. Additionally, I am having him maintain his atorvastatin, insulin NPH-regular Human, amLODipine, aspirin, carvedilol, furosemide, and isosorbide mononitrate.  Meds ordered this encounter  Medications  . isosorbide mononitrate (IMDUR) 30 MG 24 hr tablet    Sig: Take 0.5 tablets (15 mg total) by mouth daily.    Dispense:  90 tablet    Refill:  1  . cyclobenzaprine (FLEXERIL) 5 MG tablet    Sig: Take 1 tablet (5 mg total) by mouth at bedtime.    Dispense:  30 tablet    Refill:  0    Order Specific Question:  Supervising Provider    Answer:  Crecencio Mc [[id number]]     Follow-up: Return if symptoms worsen or fail to improve.

## 2015-11-23 NOTE — Progress Notes (Signed)
Pre visit review using our clinic review tool, if applicable. No additional management support is needed unless otherwise documented below in the visit note. 

## 2015-11-24 DIAGNOSIS — E119 Type 2 diabetes mellitus without complications: Secondary | ICD-10-CM | POA: Diagnosis not present

## 2015-12-02 NOTE — Assessment & Plan Note (Signed)
New onset Costochondritis is likely etiology at this time due to history and symptoms. We have sent Flexeril to pharmacy advised no heavy lifting for 2-3 more weeks note given to patient to take to work. Follow-up with provider moving forward

## 2015-12-02 NOTE — Assessment & Plan Note (Addendum)
Patient is poorly controlled hypertension he reports he is out of his Imdur and new he was coming today to get a refill Imdur sent to pharmacy with follow-up with PCP soon

## 2015-12-07 ENCOUNTER — Emergency Department (HOSPITAL_COMMUNITY)
Admission: EM | Admit: 2015-12-07 | Discharge: 2015-12-07 | Disposition: A | Discharge status: Home / Self Care | Payer: Federal, State, Local not specified - PPO | Attending: Emergency Medicine | Admitting: Emergency Medicine

## 2015-12-07 ENCOUNTER — Encounter (HOSPITAL_COMMUNITY): Payer: Self-pay | Admitting: Emergency Medicine

## 2015-12-07 ENCOUNTER — Emergency Department (EMERGENCY_DEPARTMENT_HOSPITAL): Payer: Federal, State, Local not specified - PPO

## 2015-12-07 DIAGNOSIS — M79661 Pain in right lower leg: Secondary | ICD-10-CM

## 2015-12-07 DIAGNOSIS — Z794 Long term (current) use of insulin: Secondary | ICD-10-CM | POA: Diagnosis not present

## 2015-12-07 DIAGNOSIS — Z87448 Personal history of other diseases of urinary system: Secondary | ICD-10-CM | POA: Insufficient documentation

## 2015-12-07 DIAGNOSIS — E109 Type 1 diabetes mellitus without complications: Secondary | ICD-10-CM | POA: Diagnosis not present

## 2015-12-07 DIAGNOSIS — E785 Hyperlipidemia, unspecified: Secondary | ICD-10-CM | POA: Insufficient documentation

## 2015-12-07 DIAGNOSIS — Z7982 Long term (current) use of aspirin: Secondary | ICD-10-CM | POA: Diagnosis not present

## 2015-12-07 DIAGNOSIS — Z87828 Personal history of other (healed) physical injury and trauma: Secondary | ICD-10-CM | POA: Insufficient documentation

## 2015-12-07 DIAGNOSIS — I10 Essential (primary) hypertension: Secondary | ICD-10-CM | POA: Diagnosis not present

## 2015-12-07 DIAGNOSIS — Z8719 Personal history of other diseases of the digestive system: Secondary | ICD-10-CM | POA: Insufficient documentation

## 2015-12-07 DIAGNOSIS — M791 Myalgia: Secondary | ICD-10-CM | POA: Diagnosis not present

## 2015-12-07 DIAGNOSIS — Z8669 Personal history of other diseases of the nervous system and sense organs: Secondary | ICD-10-CM | POA: Diagnosis not present

## 2015-12-07 DIAGNOSIS — Z79899 Other long term (current) drug therapy: Secondary | ICD-10-CM | POA: Diagnosis not present

## 2015-12-07 DIAGNOSIS — I509 Heart failure, unspecified: Secondary | ICD-10-CM | POA: Insufficient documentation

## 2015-12-07 LAB — CBC WITH DIFFERENTIAL/PLATELET
BASOS PCT: 0 %
Basophils Absolute: 0 10*3/uL (ref 0.0–0.1)
EOS ABS: 0.3 10*3/uL (ref 0.0–0.7)
EOS PCT: 3 %
HEMATOCRIT: 40.8 % (ref 39.0–52.0)
Hemoglobin: 13 g/dL (ref 13.0–17.0)
Lymphocytes Relative: 30 %
Lymphs Abs: 2.7 10*3/uL (ref 0.7–4.0)
MCH: 24.3 pg — ABNORMAL LOW (ref 26.0–34.0)
MCHC: 31.9 g/dL (ref 30.0–36.0)
MCV: 76.3 fL — ABNORMAL LOW (ref 78.0–100.0)
MONO ABS: 0.6 10*3/uL (ref 0.1–1.0)
MONOS PCT: 6 %
Neutro Abs: 5.2 10*3/uL (ref 1.7–7.7)
Neutrophils Relative %: 61 %
PLATELETS: 240 10*3/uL (ref 150–400)
RBC: 5.35 MIL/uL (ref 4.22–5.81)
RDW: 14.1 % (ref 11.5–15.5)
WBC: 8.8 10*3/uL (ref 4.0–10.5)

## 2015-12-07 LAB — I-STAT CHEM 8, ED
BUN: 38 mg/dL — AB (ref 6–20)
CALCIUM ION: 1.18 mmol/L (ref 1.12–1.23)
CREATININE: 2.9 mg/dL — AB (ref 0.61–1.24)
Chloride: 103 mmol/L (ref 101–111)
GLUCOSE: 187 mg/dL — AB (ref 65–99)
HCT: 44 % (ref 39.0–52.0)
HEMOGLOBIN: 15 g/dL (ref 13.0–17.0)
Potassium: 4.1 mmol/L (ref 3.5–5.1)
Sodium: 141 mmol/L (ref 135–145)
TCO2: 28 mmol/L (ref 0–100)

## 2015-12-07 MED ORDER — AMLODIPINE BESYLATE 5 MG PO TABS
5.0000 mg | ORAL_TABLET | Freq: Once | ORAL | Status: AC
  Administered 2015-12-07: 5 mg via ORAL
  Filled 2015-12-07: qty 1

## 2015-12-07 MED ORDER — CARVEDILOL 12.5 MG PO TABS
25.0000 mg | ORAL_TABLET | Freq: Two times a day (BID) | ORAL | Status: DC
  Filled 2015-12-07: qty 2

## 2015-12-07 MED ORDER — ISOSORBIDE MONONITRATE ER 30 MG PO TB24
30.0000 mg | ORAL_TABLET | Freq: Every day | ORAL | Status: DC
  Administered 2015-12-07: 30 mg via ORAL
  Filled 2015-12-07: qty 1

## 2015-12-07 NOTE — ED Provider Notes (Signed)
CSN: [id number]     Arrival date & time 12/07/15  [id number] History   First MD Initiated Contact with Patient 12/07/15 [phone]     Chief Complaint  Patient presents with  . Leg Pain     (Consider location/radiation/quality/duration/timing/severity/associated sxs/prior Treatment) Patient is a 45 y.o. male presenting with leg pain. The history is provided by the patient.  Leg Pain Associated symptoms: no back pain    Patient is a 45 year old male with a history of insulin-dependent diabetes, hypertension, who presents the ED with right calf pain since 4 AM today. He states the pain was sharp upon awakening, painful to put weight on it, worse with walking or bending his knee. Currently the pain is dull/achy, constant, nonradiating, 3/10. Pain with walking or bearing weight result in a sharp, 7/10, pain radiates superiorly behind the knee. Patient has not tried anything for this pain. He has not had this type of pain before. Patient denies smoking, recent surgery, warmth, redness, swelling to his right calf. He endorses a 6 hour car ride to [place] 3 weeks ago. He denies shortness of breath, chest pain, abdominal pain, dizziness, or syncope.  Past Medical History  Diagnosis Date  . ABSCESS, TOOTH 04/23/2009  . Esophageal reflux 04/27/2009  . FATIGUE 04/27/2009  . HYPERLIPIDEMIA 04/24/2007  . HYPERTENSION 04/24/2007  . INSOMNIA-SLEEP DISORDER-UNSPEC 04/27/2009  . LUMBAR STRAIN, ACUTE 12/11/2008  . RASH-NONVESICULAR 03/11/2008  . Shortness of breath     "lying down and w/exertion right now" (12/22/2012)  . DIABETES MELLITUS, TYPE I 07/30/2007  . CHF (congestive heart failure) ([place])   . AKI (acute kidney injury) ([place]) 08/2015   Past Surgical History  Procedure Laterality Date  . Mouth surgery     Family History  Problem Relation Age of Onset  . Hypertension Mother    Social History  Substance Use Topics  . Smoking status: Former Smoker    Types: Cigarettes    Quit date: 05/03/2015  . Smokeless  tobacco: Never Used     Comment: 12/22/2012 "used to smoke cigarettes once or twice/month; haven't had any cigarettes for years"  . Alcohol Use: 1.2 oz/week    1 Cans of beer, 1 Shots of liquor per week     Comment: 12/22/2012 "once mixed or one beer 2 times/week"    Review of Systems  Respiratory: Negative for cough, chest tightness and shortness of breath.   Cardiovascular: Negative for chest pain and leg swelling.  Gastrointestinal: Negative for nausea and vomiting.  Musculoskeletal: Positive for myalgias. Negative for back pain and arthralgias.       Right calf pain  All other systems reviewed and are negative.     Allergies  Review of patient's allergies indicates no known allergies.  Home Medications   Prior to Admission medications   Medication Sig Start Date End Date Taking? Authorizing Provider  amLODipine (NORVASC) 5 MG tablet Take 1 tablet (5 mg total) by mouth daily. 09/06/15  Yes Nita Sells, MD  aspirin EC 81 MG EC tablet Take 1 tablet (81 mg total) by mouth daily. 09/06/15  Yes Nita Sells, MD  atorvastatin (LIPITOR) 40 MG tablet Take 40 mg by mouth daily at 6 PM.   Yes Historical Provider, MD  carvedilol (COREG) 25 MG tablet Take 1 tablet (25 mg total) by mouth 2 (two) times daily. 09/06/15  Yes Nita Sells, MD  cyclobenzaprine (FLEXERIL) 5 MG tablet Take 1 tablet (5 mg total) by mouth at bedtime. 11/23/15  Yes Rubbie Battiest, NP  furosemide (LASIX) 10 MG/ML solution Take by mouth daily.   Yes Historical Provider, MD  insulin NPH-regular Human (NOVOLIN 70/30) (70-30) 100 UNIT/ML injection Inject 30-35 Units into the skin 2 (two) times daily with a meal. 35 units in the morning, 30 units in the evening   Yes Historical Provider, MD  isosorbide mononitrate (IMDUR) 30 MG 24 hr tablet Take 0.5 tablets (15 mg total) by mouth daily. 11/23/15  Yes Biagio Borg, MD   BP 175/115 mmHg  Pulse 83  Temp(Src) 97.7 F (36.5 C) (Oral)  Resp 18  Ht 5\' 6"  (1.676  m)  Wt 93.895 kg  BMI 33.43 kg/m2  SpO2 99% Physical Exam  Constitutional: He appears well-developed and well-nourished. No distress.  HENT:  Head: Normocephalic and atraumatic.  Eyes: Conjunctivae are normal.  Neck: Normal range of motion.  Cardiovascular: Normal rate, regular rhythm and normal heart sounds.   Pulmonary/Chest: Effort normal and breath sounds normal.  Abdominal: Soft. There is no tenderness.  Musculoskeletal: Normal range of motion. He exhibits tenderness. He exhibits no edema.  No edema, ecchymosis, erythema of bilateral lower extremities. Tenderness to palpation of the medial calf. No tenderness palpation of the Achilles tendon. Full range of motion. Strength 5 out of 5 on plantar flexion and extension of the left leg. Strength 4 out of 5 plantar flexion and extension of the right leg due to pain.  Neurological: He is alert. Coordination normal.  Skin: Skin is warm and dry. No rash noted. He is not diaphoretic.  Psychiatric: He has a normal mood and affect. His behavior is normal.    ED Course  Procedures (including critical care time) Labs Review Labs Reviewed  CBC WITH DIFFERENTIAL/PLATELET - Abnormal; Notable for the following:    MCV 76.3 (*)    MCH 24.3 (*)    All other components within normal limits  I-STAT CHEM 8, ED - Abnormal; Notable for the following:    BUN 38 (*)    Creatinine, Ser 2.90 (*)    Glucose, Bld 187 (*)    All other components within normal limits    Imaging Review No results found. I have personally reviewed and evaluated these images and lab results as part of my medical decision-making.    MDM   Final diagnoses:  Right calf pain    1020: Patient states his pain is well-controlled as long as he doesn't move his leg. He doesn't want anything for pain. He states he did not take his blood pressure medications this morning. His blood pressure was found to be 175/115. We will give him his home blood pressure meds while in the ED  and reassess his blood pressure.  His blood pressure came down slightly at time of discharge. He is aware of his blood pressure as it is close to his baseline and will follow-up his primary care provider regarding this.  The results of the venous Doppler of patient's right leg revealed no DVT. Patient states his pain is improving with no pain meds. His pain is likely musculoskeletal in nature with a negative ultrasound. Patient not tachycardic, he is not short of breath, negative ultrasound, no recent surgeries, no tobacco use, no recent travel less likely this is a DVT or PE. I reassessed patient's tenderness in his calf prior to discharge and he was less tender to palpation. I discussed stretching, massage, ice for his calf pain.  Patient's serum creatinine was found to be elevated but this is very close to his baseline.  He is aware of his kidney function and sees a nephrologist.  I instructed him to follow-up with his primary care provider regarding his visit to the ED today. I discussed strict return precautions. He expressed understanding to the discharge instructions.        Kalman Drape, PA 12/07/15 1543  Carmin Muskrat, MD 12/07/15 [phone]

## 2015-12-07 NOTE — Progress Notes (Signed)
*  PRELIMINARY RESULTS* Vascular Ultrasound Right lower extremity venous duplex has been completed.  Preliminary findings: No evidence of DVT or baker's cyst.   Landry Mellow, RDMS, RVT  12/07/2015, 8:57 AM

## 2015-12-07 NOTE — ED Notes (Signed)
NAD at this time. Pt is stable and going home with his parents.

## 2015-12-07 NOTE — ED Notes (Addendum)
Patient with right calf pain that started this morning after getting out of bed.  Patient states that it is a sharp burning pain in the right calf.  Patient states that he has not been able to walk well on it at all.  Patient denies any trauma to the right leg.

## 2015-12-07 NOTE — Discharge Instructions (Signed)
Follow-up with your primary care provider regarding your visit to the emergency department today. Short follow-up with your nephrologist regarding your kidney function.  Return to the emergency department if you experience worsening pain, shortness of breath, chest pain, fever, nausea, vomiting, chills or fever past.  Musculoskeletal Pain Musculoskeletal pain is muscle and boney aches and pains. These pains can occur in any part of the body. Your caregiver may treat you without knowing the cause of the pain. They may treat you if blood or urine tests, X-rays, and other tests were normal.  CAUSES There is often not a definite cause or reason for these pains. These pains may be caused by a type of germ (virus). The discomfort may also come from overuse. Overuse includes working out too hard when your body is not fit. Boney aches also come from weather changes. Bone is sensitive to atmospheric pressure changes. HOME CARE INSTRUCTIONS   Ask when your test results will be ready. Make sure you get your test results.  Only take over-the-counter or prescription medicines for pain, discomfort, or fever as directed by your caregiver. If you were given medications for your condition, do not drive, operate machinery or power tools, or sign legal documents for 24 hours. Do not drink alcohol. Do not take sleeping pills or other medications that may interfere with treatment.  Continue all activities unless the activities cause more pain. When the pain lessens, slowly resume normal activities. Gradually increase the intensity and duration of the activities or exercise.  During periods of severe pain, bed rest may be helpful. Lay or sit in any position that is comfortable.  Putting ice on the injured area.  Put ice in a bag.  Place a towel between your skin and the bag.  Leave the ice on for 15 to 20 minutes, 3 to 4 times a day.  Follow up with your caregiver for continued problems and no reason can be  found for the pain. If the pain becomes worse or does not go away, it may be necessary to repeat tests or do additional testing. Your caregiver may need to look further for a possible cause. SEEK IMMEDIATE MEDICAL CARE IF:  You have pain that is getting worse and is not relieved by medications.  You develop chest pain that is associated with shortness or breath, sweating, feeling sick to your stomach (nauseous), or throw up (vomit).  Your pain becomes localized to the abdomen.  You develop any new symptoms that seem different or that concern you. MAKE SURE YOU:   Understand these instructions.  Will watch your condition.  Will get help right away if you are not doing well or get worse.   This information is not intended to replace advice given to you by your health care provider. Make sure you discuss any questions you have with your health care provider.   Document Released: 07/30/2005 Document Revised: 10/22/2011 Document Reviewed: 04/03/2013 Elsevier Interactive Patient Education [employer].

## 2015-12-15 DIAGNOSIS — N183 Chronic kidney disease, stage 3 (moderate): Secondary | ICD-10-CM | POA: Diagnosis not present

## 2015-12-15 DIAGNOSIS — I129 Hypertensive chronic kidney disease with stage 1 through stage 4 chronic kidney disease, or unspecified chronic kidney disease: Secondary | ICD-10-CM | POA: Diagnosis not present

## 2016-03-01 ENCOUNTER — Ambulatory Visit (INDEPENDENT_AMBULATORY_CARE_PROVIDER_SITE_OTHER): Payer: Federal, State, Local not specified - PPO | Admitting: Internal Medicine

## 2016-03-01 ENCOUNTER — Encounter: Payer: Self-pay | Admitting: Internal Medicine

## 2016-03-01 DIAGNOSIS — E1140 Type 2 diabetes mellitus with diabetic neuropathy, unspecified: Secondary | ICD-10-CM

## 2016-03-01 DIAGNOSIS — I10 Essential (primary) hypertension: Secondary | ICD-10-CM

## 2016-03-01 DIAGNOSIS — I5032 Chronic diastolic (congestive) heart failure: Secondary | ICD-10-CM | POA: Diagnosis not present

## 2016-03-01 MED ORDER — FUROSEMIDE 40 MG PO TABS: ORAL_TABLET | ORAL | Status: DC

## 2016-03-01 NOTE — Patient Instructions (Signed)
OK to take the lasix 40mg  at least every M-W-F every week, and other days for any weight increase of 2 lbs  Please continue all other medications as before, and refills have been done if requested.  Please have the pharmacy call with any other refills you may need.  Please keep your appointments with your specialists as you may have planned  You are given the work note  Your FMLA will be addressed and you should be notified

## 2016-03-01 NOTE — Progress Notes (Signed)
Pre visit review using our clinic review tool, if applicable. No additional management support is needed unless otherwise documented below in the visit note. 

## 2016-03-01 NOTE — Progress Notes (Signed)
Subjective:    Patient ID: Hector Mahoney, male    DOB: 12/18/1970, 45 y.o.   MRN: [id number]  HPI  Here to f/u; overall doing ok,  Pt denies chest pain, increasing sob or doe, wheezing, orthopnea, PND, palpitations, dizziness or syncope. But has worsening significant orthopnea/DOE and trace bilat leg swelling, with wt gain. Has been taking the lasix prn since this was suggested at an ED visit for low volume, but no med in past wk.  Has hx of chronic diast chf x 3 yrs, cont's to flare occasoinally.     Pt denies new neurological symptoms such as new headache, or facial or extremity weakness or numbness.  Pt denies polydipsia, polyuria, or low sugar episode.   Pt denies new neurological symptoms such as new headache, or facial or extremity weakness or numbness.   Pt states overall good compliance with meds, mostly trying to follow appropriate diet Past Medical History:  Diagnosis Date  . ABSCESS, TOOTH 04/23/2009  . AKI (acute kidney injury) ([place]) 08/2015  . CHF (congestive heart failure) ([place])   . DIABETES MELLITUS, TYPE I 07/30/2007  . Esophageal reflux 04/27/2009  . FATIGUE 04/27/2009  . HYPERLIPIDEMIA 04/24/2007  . HYPERTENSION 04/24/2007  . INSOMNIA-SLEEP DISORDER-UNSPEC 04/27/2009  . LUMBAR STRAIN, ACUTE 12/11/2008  . RASH-NONVESICULAR 03/11/2008  . Shortness of breath    "lying down and w/exertion right now" (12/22/2012)   Past Surgical History:  Procedure Laterality Date  . MOUTH SURGERY      reports that he quit smoking about 10 months ago. His smoking use included Cigarettes. He has never used smokeless tobacco. He reports that he drinks about 1.2 oz of alcohol per week . He reports that he does not use drugs. family history includes Hypertension in his mother. No Known Allergies Current Outpatient Prescriptions on File Prior to Visit  Medication Sig Dispense Refill  . amLODipine (NORVASC) 5 MG tablet Take 1 tablet (5 mg total) by mouth daily. 30 tablet 0  . aspirin EC 81 MG EC  tablet Take 1 tablet (81 mg total) by mouth daily. 30 tablet 0  . atorvastatin (LIPITOR) 40 MG tablet Take 40 mg by mouth daily at 6 PM.    . carvedilol (COREG) 25 MG tablet Take 1 tablet (25 mg total) by mouth 2 (two) times daily. 60 tablet 11  . cyclobenzaprine (FLEXERIL) 5 MG tablet Take 1 tablet (5 mg total) by mouth at bedtime. 30 tablet 0  . insulin NPH-regular Human (NOVOLIN 70/30) (70-30) 100 UNIT/ML injection Inject 30-35 Units into the skin 2 (two) times daily with a meal. 35 units in the morning, 30 units in the evening    . isosorbide mononitrate (IMDUR) 30 MG 24 hr tablet Take 0.5 tablets (15 mg total) by mouth daily. 90 tablet 1   No current facility-administered medications on file prior to visit.    Review of Systems  Constitutional: Negative for unusual diaphoresis or night sweats HENT: Negative for ear swelling or discharge Eyes: Negative for worsening visual haziness  Respiratory: Negative for choking and stridor.   Gastrointestinal: Negative for distension or worsening eructation Genitourinary: Negative for retention or change in urine volume.  Musculoskeletal: Negative for other MSK pain or swelling Skin: Negative for color change and worsening wound Neurological: Negative for tremors and numbness other than noted  Psychiatric/Behavioral: Negative for decreased concentration or agitation other than above        Objective:   Physical Exam BP 124/82   Pulse 90  Temp 98.3 F (36.8 C) (Oral)   Resp 20   Wt 208 lb (94.3 kg)   SpO2 93%   BMI 33.57 kg/m   VS noted,  Constitutional: Pt appears in no apparent distress HENT: Head: NCAT.  Right Ear: External ear normal.  Left Ear: External ear normal.  Eyes: . Pupils are equal, round, and reactive to light. Conjunctivae and EOM are normal Neck: Normal range of motion. Neck supple.  Cardiovascular: Normal rate and regular rhythm.   Pulmonary/Chest: Effort normal and breath sounds decreased bilat without rales or  wheezing.  Abd:  Soft, NT, ND, + BS Neurological: Pt is alert. Not confused , motor grossly intact Skin: Skin is warm. No rash, bilat trace LE edema Psychiatric: Pt behavior is normal. No agitation.     Assessment & Plan:

## 2016-03-04 NOTE — Assessment & Plan Note (Addendum)
Poor control with last a1c, sees endo, o/w stable overall by history and exam, recent data reviewed with pt, and pt to continue medical treatment as before,  to f/u any worsening symptoms or concerns Lab Results  Component Value Date   HGBA1C 13.5 (H) 09/02/2015   Pt has been preference to follow at the [facility] for DM, but will need to see if he wants local referral as well

## 2016-03-04 NOTE — Assessment & Plan Note (Signed)
With mild worsening, did have overdiuresis with previous lasix daily, now with mild volume increased, so for lasix 40 every m-w-f only,  to f/u any worsening symptoms or concerns

## 2016-03-04 NOTE — Assessment & Plan Note (Signed)
stable overall by history and exam, recent data reviewed with pt, and pt to continue medical treatment as before,  to f/u any worsening symptoms or concerns BP Readings from Last 3 Encounters:  03/01/16 124/82  12/07/15 162/94  11/23/15 (!) 162/118

## 2016-03-05 ENCOUNTER — Telehealth: Payer: Self-pay

## 2016-03-05 DIAGNOSIS — E1065 Type 1 diabetes mellitus with hyperglycemia: Principal | ICD-10-CM

## 2016-03-05 DIAGNOSIS — IMO0001 Reserved for inherently not codable concepts without codable children: Secondary | ICD-10-CM

## 2016-03-05 NOTE — Telephone Encounter (Signed)
-----   Message from Biagio Borg, MD sent at 03/04/2016 12:27 PM EDT ----- Regarding: ? DM followed at the [facility] to contact pt; on chart review his last a1c in jan 2017 was very high over 13;    I recall he is seen at the [facility] for DM, but would he want a referral to endo locally instead for better control?

## 2016-03-05 NOTE — Telephone Encounter (Signed)
Called patient, left message for patient to give [place] a call back regarding referral question.

## 2016-03-05 NOTE — Telephone Encounter (Signed)
-----   Message from Biagio Borg, MD sent at 03/04/2016 12:27 PM EDT ----- Regarding: ? DM followed at the [facility] to contact pt; on chart review his last a1c in jan 2017 was very high over 13;    I recall he is seen at the [facility] for DM, but would he want a referral to endo locally instead for better control?

## 2016-03-05 NOTE — Telephone Encounter (Signed)
Please advise patient would like a referral for somewhere locally

## 2016-03-06 NOTE — Telephone Encounter (Signed)
[place] for local Endo referral   - done

## 2016-03-06 NOTE — Addendum Note (Signed)
Addended by: Biagio Borg on: 03/06/2016 01:12 PM   Modules accepted: Orders

## 2016-03-08 ENCOUNTER — Telehealth: Payer: Self-pay

## 2016-03-08 NOTE — Telephone Encounter (Signed)
Paperwork filled out for patient, patient aware and is picking up

## 2016-04-03 ENCOUNTER — Ambulatory Visit (INDEPENDENT_AMBULATORY_CARE_PROVIDER_SITE_OTHER): Payer: Federal, State, Local not specified - PPO | Admitting: Internal Medicine

## 2016-04-03 ENCOUNTER — Encounter: Payer: Self-pay | Admitting: Internal Medicine

## 2016-04-03 VITALS — BP 126/74 | HR 105 | Temp 97.8°F | Resp 20 | Wt 208.0 lb

## 2016-04-03 DIAGNOSIS — I10 Essential (primary) hypertension: Secondary | ICD-10-CM

## 2016-04-03 DIAGNOSIS — G4762 Sleep related leg cramps: Secondary | ICD-10-CM | POA: Diagnosis not present

## 2016-04-03 DIAGNOSIS — N183 Chronic kidney disease, stage 3 unspecified: Secondary | ICD-10-CM

## 2016-04-03 DIAGNOSIS — I5032 Chronic diastolic (congestive) heart failure: Secondary | ICD-10-CM | POA: Diagnosis not present

## 2016-04-03 DIAGNOSIS — E1140 Type 2 diabetes mellitus with diabetic neuropathy, unspecified: Secondary | ICD-10-CM | POA: Diagnosis not present

## 2016-04-03 MED ORDER — CYCLOBENZAPRINE HCL 5 MG PO TABS: 5.0000 mg | ORAL_TABLET | Freq: Every day | ORAL | 1 refills | Status: DC

## 2016-04-03 NOTE — Assessment & Plan Note (Signed)
stable overall by history and exam, recent data reviewed with pt, and pt to continue medical treatment as before,  to f/u any worsening symptoms or concerns Lab Results  Component Value Date   HGBA1C 13.5 (H) 09/02/2015  hopefully now improved - for f/u lab

## 2016-04-03 NOTE — Progress Notes (Signed)
Letter sent for work.

## 2016-04-03 NOTE — Patient Instructions (Addendum)
Please take all new medication as prescribed --the flexeril at night as needed  Please continue all other medications as before, and refills have been done if requested.  Please have the pharmacy call with any other refills you may need.  Please continue your efforts at being more active, low cholesterol diet, and weight control.  Please keep your appointments with your specialists as you may have planned  Please go to the LAB in the Basement (turn left off the elevator) for the tests to be done today  You will be contacted by phone if any changes need to be made immediately.  Otherwise, you will receive a letter about your results with an explanation, but please check with MyChart first.  Please return in 6 months, or sooner if needed

## 2016-04-03 NOTE — Assessment & Plan Note (Signed)
stable overall by history and exam, recent data reviewed with pt, and pt to continue medical treatment as before,  to f/u any worsening symptoms or concerns BP Readings from Last 3 Encounters:  04/03/16 126/74  03/01/16 124/82  12/07/15 162/94

## 2016-04-03 NOTE — Progress Notes (Signed)
Subjective:    Patient ID: Hector Mahoney, male    DOB: 09-10-1970, 45 y.o.   MRN: [id number]  HPI  Here to f/u; overall doing ok,  Pt denies chest pain, increasing sob or doe, wheezing, orthopnea, PND, increased LE swelling, palpitations, dizziness or syncope.  Pt denies new neurological symptoms such as new headache, or facial or extremity weakness or numbness.  Pt denies polydipsia, polyuria, or low sugar episode.   Pt denies new neurological symptoms such as new headache, or facial or extremity weakness or numbness.   Pt states overall good compliance with meds, mostly trying to follow appropriate diet, with wt overall stable,  but little exercise however. Also with leg cramps much worse in freq and severity in last 3 mo, mod, now happening 5 times per wk , can happen even with turning over at night.  CBG's not doing poorly per pt, "doing good" with a high recently 150 cbg at home,  Taking diuretic as well, only taking m-w-f, no c/o worsening pulm edema again, and has no hx of leg swelling.  Not yet seen renal, but has appt with Dr Vanita Panda in about 2 wks. Had a small rx for flexeril  Per NP from April 12, takes only rarely but does work, though does have some hangover loopiness in the AM since he has to be at work at [employer], so just takes earlier in the evening, is ok for refil Past Medical History:  Diagnosis Date  . ABSCESS, TOOTH 04/23/2009  . AKI (acute kidney injury) ([place]) 08/2015  . CHF (congestive heart failure) ([place])   . DIABETES MELLITUS, TYPE I 07/30/2007  . Esophageal reflux 04/27/2009  . FATIGUE 04/27/2009  . HYPERLIPIDEMIA 04/24/2007  . HYPERTENSION 04/24/2007  . INSOMNIA-SLEEP DISORDER-UNSPEC 04/27/2009  . LUMBAR STRAIN, ACUTE 12/11/2008  . RASH-NONVESICULAR 03/11/2008  . Shortness of breath    "lying down and w/exertion right now" (12/22/2012)   Past Surgical History:  Procedure Laterality Date  . MOUTH SURGERY      reports that he quit smoking about 11 months ago. His smoking  use included Cigarettes. He has never used smokeless tobacco. He reports that he drinks about 1.2 oz of alcohol per week . He reports that he does not use drugs. family history includes Hypertension in his mother. No Known Allergies Current Outpatient Prescriptions on File Prior to Visit  Medication Sig Dispense Refill  . amLODipine (NORVASC) 5 MG tablet Take 1 tablet (5 mg total) by mouth daily. 30 tablet 0  . aspirin EC 81 MG EC tablet Take 1 tablet (81 mg total) by mouth daily. 30 tablet 0  . atorvastatin (LIPITOR) 40 MG tablet Take 40 mg by mouth daily at 6 PM.    . carvedilol (COREG) 25 MG tablet Take 1 tablet (25 mg total) by mouth 2 (two) times daily. 60 tablet 11  . furosemide (LASIX) 40 MG tablet 1 tab by mouth every M-W-F, and other days as needed if wt increased 2 lbs 90 tablet 3  . insulin NPH-regular Human (NOVOLIN 70/30) (70-30) 100 UNIT/ML injection Inject 30-35 Units into the skin 2 (two) times daily with a meal. 35 units in the morning, 30 units in the evening    . isosorbide mononitrate (IMDUR) 30 MG 24 hr tablet Take 0.5 tablets (15 mg total) by mouth daily. 90 tablet 1   No current facility-administered medications on file prior to visit.    Review of Systems  Constitutional: Negative for unusual diaphoresis or night  sweats HENT: Negative for ear swelling or discharge Eyes: Negative for worsening visual haziness  Respiratory: Negative for choking and stridor.   Gastrointestinal: Negative for distension or worsening eructation Genitourinary: Negative for retention or change in urine volume.  Musculoskeletal: Negative for other MSK pain or swelling Skin: Negative for color change and worsening wound Neurological: Negative for tremors and numbness other than noted  Psychiatric/Behavioral: Negative for decreased concentration or agitation other than above       Objective:   Physical Exam BP 126/74   Pulse (!) 105   Temp 97.8 F (36.6 C) (Oral)   Resp 20   Wt 208 lb  (94.3 kg)   SpO2 99%   BMI 33.57 kg/m  VS noted,  Constitutional: Pt appears in no apparent distress HENT: Head: NCAT.  Right Ear: External ear normal.  Left Ear: External ear normal.  Eyes: . Pupils are equal, round, and reactive to light. Conjunctivae and EOM are normal Neck: Normal range of motion. Neck supple.  Cardiovascular: Normal rate and regular rhythm.   Pulmonary/Chest: Effort normal and breath sounds without rales or wheezing.  Abd:  Soft, NT, ND, + BS Neurological: Pt is alert. Not confused , motor grossly intact Skin: Skin is warm. No rash, no LE edema, dorsalis pedis 1+ bilat Psychiatric: Pt behavior is normal. No agitation.     Assessment & Plan:

## 2016-04-03 NOTE — Progress Notes (Signed)
Pre visit review using our clinic review tool, if applicable. No additional management support is needed unless otherwise documented below in the visit note. 

## 2016-04-03 NOTE — Assessment & Plan Note (Signed)
Etiology unclear, exam benign, ok to cont diuretic for now, add flexeril qhs prn, but also check BMP, ca, magnesium

## 2016-04-03 NOTE — Assessment & Plan Note (Signed)
stable overall by history and exam, and pt to continue medical treatment as before,  to f/u any worsening symptoms or concerns 

## 2016-04-03 NOTE — Assessment & Plan Note (Signed)
stable overall by history and exam, recent data reviewed with pt, and pt to continue medical treatment as before,  to f/u any worsening symptoms or concerns Lab Results  Component Value Date   CREATININE 2.90 (H) 12/07/2015   For fu lab, cont renal referral

## 2016-04-25 ENCOUNTER — Encounter: Payer: Self-pay | Admitting: Internal Medicine

## 2016-04-25 ENCOUNTER — Ambulatory Visit (INDEPENDENT_AMBULATORY_CARE_PROVIDER_SITE_OTHER): Payer: Federal, State, Local not specified - PPO | Admitting: Internal Medicine

## 2016-04-25 VITALS — BP 140/94 | HR 108 | Ht 67.0 in | Wt 209.0 lb

## 2016-04-25 DIAGNOSIS — E1159 Type 2 diabetes mellitus with other circulatory complications: Secondary | ICD-10-CM | POA: Diagnosis not present

## 2016-04-25 DIAGNOSIS — E1165 Type 2 diabetes mellitus with hyperglycemia: Secondary | ICD-10-CM

## 2016-04-25 LAB — POCT GLYCOSYLATED HEMOGLOBIN (HGB A1C): HEMOGLOBIN A1C: 10.9

## 2016-04-25 MED ORDER — INSULIN ASPART 100 UNIT/ML ~~LOC~~ SOLN: 8.0000 [IU] | Freq: Three times a day (TID) | SUBCUTANEOUS | 5 refills | Status: DC

## 2016-04-25 MED ORDER — INSULIN GLARGINE 100 UNIT/ML ~~LOC~~ SOLN: 40.0000 [IU] | Freq: Every day | SUBCUTANEOUS | 5 refills | Status: DC

## 2016-04-25 MED ORDER — "INSULIN SYRINGE-NEEDLE U-100 31G X 5/16"" 0.5 ML MISC": 5 refills | Status: DC

## 2016-04-25 NOTE — Addendum Note (Signed)
Addended by: Caprice Beaver T on: 04/25/2016 01:06 PM   Modules accepted: Orders

## 2016-04-25 NOTE — Patient Instructions (Signed)
Please stop 70/30 and start: - Lantus 40 units at bedtime - Novolog:  - 8 units before a smaller meal  - 10 units before a regular meal  - 12 units before a larger meal  Please let me know if the sugars are consistently <80 or >200.  Please return in 1.5 months with your sugar log.   PATIENT INSTRUCTIONS FOR TYPE 2 DIABETES:  **Please join MyChart!** - see attached instructions about how to join if you have not done so already.  DIET AND EXERCISE Diet and exercise is an important part of diabetic treatment.  We recommended aerobic exercise in the form of brisk walking (working between 40-60% of maximal aerobic capacity, similar to brisk walking) for 150 minutes per week (such as 30 minutes five days per week) along with 3 times per week performing 'resistance' training (using various gauge rubber tubes with handles) 5-10 exercises involving the major muscle groups (upper body, lower body and core) performing 10-15 repetitions (or near fatigue) each exercise. Start at half the above goal but build slowly to reach the above goals. If limited by weight, joint pain, or disability, we recommend daily walking in a swimming pool with water up to waist to reduce pressure from joints while allow for adequate exercise.    BLOOD GLUCOSES Monitoring your blood glucoses is important for continued management of your diabetes. Please check your blood glucoses 2-4 times a day: fasting, before meals and at bedtime (you can rotate these measurements - e.g. one day check before the 3 meals, the next day check before 2 of the meals and before bedtime, etc.).   HYPOGLYCEMIA (low blood sugar) Hypoglycemia is usually a reaction to not eating, exercising, or taking too much insulin/ other diabetes drugs.  Symptoms include tremors, sweating, hunger, confusion, headache, etc. Treat IMMEDIATELY with 15 grams of Carbs: . 4 glucose tablets .  cup regular juice/soda . 2 tablespoons raisins . 4 teaspoons sugar . 1  tablespoon honey Recheck blood glucose in 15 mins and repeat above if still symptomatic/blood glucose <100.  RECOMMENDATIONS TO REDUCE YOUR RISK OF DIABETIC COMPLICATIONS: * Take your prescribed MEDICATION(S) * Follow a DIABETIC diet: Complex carbs, fiber rich foods, (monounsaturated and polyunsaturated) fats * AVOID saturated/trans fats, high fat foods, >2,300 mg salt per day. * EXERCISE at least 5 times a week for 30 minutes or preferably daily.  * DO NOT SMOKE OR DRINK more than 1 drink a day. * Check your FEET every day. Do not wear tightfitting shoes. Contact us if you develop an ulcer * See your EYE doctor once a year or more if needed * Get a FLU shot once a year * Get a PNEUMONIA vaccine once before and once after age 57 years  GOALS:  * Your Hemoglobin A1c of <7%  * fasting sugars need to be <130 * after meals sugars need to be <180 (2h after you start eating) * Your Systolic BP should be 485 or lower  * Your Diastolic BP should be 80 or lower  * Your HDL (Good Cholesterol) should be 40 or higher  * Your LDL (Bad Cholesterol) should be 100 or lower. * Your Triglycerides should be 150 or lower  * Your Urine microalbumin (kidney function) should be <30 * Your Body Mass Index should be 25 or lower    Please consider the following ways to cut down carbs and fat and increase fiber and micronutrients in your diet: - substitute whole grain for white bread or pasta -  substitute brown rice for white rice - substitute 90-calorie flat bread pieces for slices of bread when possible - substitute sweet potatoes or yams for white potatoes - substitute humus for margarine - substitute tofu for cheese when possible - substitute almond or rice milk for regular milk (would not drink soy milk daily due to concern for soy estrogen influence on breast cancer risk) - substitute dark chocolate for other sweets when possible - substitute water - can add lemon or orange slices for taste - for diet  sodas (artificial sweeteners will trick your body that you can eat sweets without getting calories and will lead you to overeating and weight gain in the long run) - do not skip breakfast or other meals (this will slow down the metabolism and will result in more weight gain over time)  - can try smoothies made from fruit and almond/rice milk in am instead of regular breakfast - can also try old-fashioned (not instant) oatmeal made with almond/rice milk in am - order the dressing on the side when eating salad at a restaurant (pour less than half of the dressing on the salad) - eat as little meat as possible - can try juicing, but should not forget that juicing will get rid of the fiber, so would alternate with eating raw veg./fruits or drinking smoothies - use as little oil as possible, even when using olive oil - can dress a salad with a mix of balsamic vinegar and lemon juice, for e.g. - use agave nectar, stevia sugar, or regular sugar rather than artificial sweateners - steam or broil/roast veggies  - snack on veggies/fruit/nuts (unsalted, preferably) when possible, rather than processed foods - reduce or eliminate aspartame in diet (it is in diet sodas, chewing gum, etc) Read the labels!  Try to read Dr. Janene Harvey book: "Program for Reversing Diabetes" for other ideas for healthy eating.

## 2016-04-25 NOTE — Progress Notes (Signed)
Patient ID: Hector OKANE, male   DOB: August 05, 1971, 45 y.o.   MRN: [id number]   HPI: Hector Mahoney is a 45 y.o.-year-old male, referred by his PCP, Dr.John, for management of DM2, dx in 1996/11/08 (after anthrax vaccine - while in [place]), insulin-dependent since dx, uncontrolled, with complications (CHF - class 3; CKD; DR). Gets DM meds from [place].  Last hemoglobin A1c was: Lab Results  Component Value Date   HGBA1C 13.5 (H) 09/02/2015   HGBA1C 11.4 (H) 01/02/2013   HGBA1C 10.1 (H) 04/27/2009   Pt is on a regimen of: - Novolin 70/30 43 and 30 units 2x a day, right before meals  Pt checks his sugars 3-4x a day and they are: - am: 130-150 - 2h after b'fast: n/c - before lunch: 180-210 - 2h after lunch: n/c - before dinner: 170-190 - 2h after dinner: n/c - bedtime:  - nighttime: 50-60s - 3x a month + lows at night. Lowest sugar was 54 (2-4 am); he has hypoglycemia awareness at 70.  Highest sugar was 382 (party).  Glucometer: Precision Xtra (from [place])  Pt's meals are: - Breakfast: cereal + 2% milk - Lunch: leftovers from dinner or eating out (fast food) - Dinner: meat + veggies + starch - Snacks: rarely No sodas. + juice 1-2/meal with lunch and dinner. Saw nutrition before.  - + CKD, last BUN/creatinine - Dr Marval Regal:  Lab Results  Component Value Date   BUN 38 (H) 12/07/2015   BUN 33 (H) 09/08/2015   CREATININE 2.90 (H) 12/07/2015   CREATININE 2.36 (H) 09/08/2015   - last set of lipids: Lab Results  Component Value Date   CHOL 293 (H) 01/02/2013   HDL 59.60 01/02/2013   LDLDIRECT 176.4 01/02/2013   TRIG 316.0 (H) 01/02/2013   CHOLHDL 5 01/02/2013  On Lipitor. - last eye exam was Nov 08, 2013 (Dr. Zadie Rhine). + DR. Had Laser Sx. In 2013/11/08. - + numbness and tingling in his feet  - especially at night. On ASA 81.  Pt has FH of DM in mother - dx'ed later.  He also has HTN, HL, GERD.  ROS: Constitutional: no weight gain/loss, + fatigue, no subjective  hyperthermia/hypothermia Eyes: no blurry vision, no xerophthalmia ENT: no sore throat, no nodules palpated in throat, no dysphagia/odynophagia, no hoarseness Cardiovascular: no CP/+ SOB/no palpitations/leg swelling Respiratory: no cough/+ SOB Gastrointestinal: no N/V/D/C Musculoskeletal: no muscle/joint aches Skin: no rashes Neurological: no tremors/numbness/tingling/dizziness Psychiatric: no depression/anxiety + low libido  Past Medical History:  Diagnosis Date  . ABSCESS, TOOTH 04/23/2009  . AKI (acute kidney injury) ([place]) 08/2015  . CHF (congestive heart failure) ([place])   . DIABETES MELLITUS, TYPE I 07/30/2007  . Esophageal reflux 04/27/2009  . FATIGUE 04/27/2009  . HYPERLIPIDEMIA 04/24/2007  . HYPERTENSION 04/24/2007  . INSOMNIA-SLEEP DISORDER-UNSPEC 04/27/2009  . LUMBAR STRAIN, ACUTE 12/11/2008  . RASH-NONVESICULAR 03/11/2008  . Shortness of breath    "lying down and w/exertion right now" (12/22/2012)   Past Surgical History:  Procedure Laterality Date  . MOUTH SURGERY     Social History   Social History  . Marital status: widowed. His wife had diabetes >> died in 08-Nov-2012.    Spouse name: N/A  . Number of children: 0   Occupational History  . 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 Smoking status: Former Smoker    Types: Cigarettes    Quit date: 05/03/2015  . Smokeless tobacco: Never Used     Comment: 12/22/2012 "used to smoke  cigarettes once or twice/month; haven't had any cigarettes for years"  . Alcohol use 1 drink 3x a week: beer, wine  . Drug use: No   Current Outpatient Prescriptions on File Prior to Visit  Medication Sig Dispense Refill  . amLODipine (NORVASC) 5 MG tablet Take 1 tablet (5 mg total) by mouth daily. 30 tablet 0  . aspirin EC 81 MG EC tablet Take 1 tablet (81 mg total) by mouth daily. 30 tablet 0  . atorvastatin (LIPITOR) 40 MG tablet Take 40 mg by mouth daily at 6 PM.    . carvedilol (COREG) 25 MG tablet Take 1 tablet (25 mg  total) by mouth 2 (two) times daily. 60 tablet 11  . cyclobenzaprine (FLEXERIL) 5 MG tablet Take 1 tablet (5 mg total) by mouth at bedtime. 90 tablet 1  . furosemide (LASIX) 40 MG tablet 1 tab by mouth every M-W-F, and other days as needed if wt increased 2 lbs 90 tablet 3  . insulin NPH-regular Human (NOVOLIN 70/30) (70-30) 100 UNIT/ML injection Inject 30-35 Units into the skin 2 (two) times daily with a meal. 43 units in the morning, 30 units in the evening    . isosorbide mononitrate (IMDUR) 30 MG 24 hr tablet Take 0.5 tablets (15 mg total) by mouth daily. 90 tablet 1   No current facility-administered medications on file prior to visit.    No Known Allergies Family History  Problem Relation Age of Onset  . Hypertension Mother     PE: BP (!) 140/94 (BP Location: Left Arm, Patient Position: Sitting)   Pulse (!) 108   Ht 5\' 7"  (1.702 m)   Wt 209 lb (94.8 kg)   SpO2 97%   BMI 32.73 kg/m  Wt Readings from Last 3 Encounters:  04/25/16 209 lb (94.8 kg)  04/03/16 208 lb (94.3 kg)  03/01/16 208 lb (94.3 kg)   Constitutional: overweight, in NAD Eyes: PERRLA, EOMI, no exophthalmos ENT: moist mucous membranes, no thyromegaly, no cervical lymphadenopathy Cardiovascular: RRR, No MRG Respiratory: CTA B Gastrointestinal: abdomen soft, NT, ND, BS+ Musculoskeletal: no deformities, strength intact in all 4 Skin: moist, warm, no rashes Neurological: no tremor with outstretched hands, DTR normal in all 4  ASSESSMENT: 1. DM2, insulin-dependent, uncontrolled, with complications - CHF - class 3 - CKD - DR  PLAN:  1. Patient with long-standing, uncontrolled diabetes, on premixed regimen, which became insufficient. HbA1c 10.6% today. Sugars are lower in the morning and increased throughout the day, as expected from insufficient mealtime insulin. He can also have lows overnight, as expected from excessive basal insulin. I therefore suggested to switch to a basal-bolus regimen, which is more  flexible. He agrees with this. He would like to stay with vials, since he has been using these ever since he started insulin 20 years ago. - We discussed at length about diet, which is poor, and I made some suggestions to improve it  - I suggested to:  Patient Instructions  Please stop 70/30 and start: - Lantus 40 units at bedtime - Novolog:  - 8 units before a smaller meal  - 10 units before a regular meal  - 12 units before a larger meal  Please let me know if the sugars are consistently <80 or >200.  Please return in 1.5 months with your sugar log.   - Strongly advised him to start checking sugars at different times of the day - check 3 times a day, rotating checks - given sugar log and advised how  to fill it and to bring it at next appt  - given foot care handout and explained the principles  - given instructions for hypoglycemia management "15-15 rule"  - advised for yearly eye exams  - Return to clinic in 1.5 mo with sugar log   Philemon Kingdom, MD PhD [facility]

## 2016-04-26 ENCOUNTER — Other Ambulatory Visit: Payer: Self-pay

## 2016-04-26 MED ORDER — "INSULIN SYRINGE-NEEDLE U-100 31G X 15/64"" 0.5 ML MISC": 5 refills | Status: DC

## 2016-04-30 DIAGNOSIS — E11649 Type 2 diabetes mellitus with hypoglycemia without coma: Secondary | ICD-10-CM | POA: Diagnosis not present

## 2016-04-30 DIAGNOSIS — Z794 Long term (current) use of insulin: Secondary | ICD-10-CM | POA: Diagnosis not present

## 2016-05-01 ENCOUNTER — Encounter (HOSPITAL_COMMUNITY): Payer: Self-pay | Admitting: Emergency Medicine

## 2016-05-01 ENCOUNTER — Observation Stay (HOSPITAL_COMMUNITY)
Admission: EM | Admit: 2016-05-01 | Discharge: 2016-05-01 | Disposition: A | Discharge status: Home / Self Care | Payer: Federal, State, Local not specified - PPO | Attending: Internal Medicine | Admitting: Internal Medicine

## 2016-05-01 DIAGNOSIS — R252 Cramp and spasm: Secondary | ICD-10-CM

## 2016-05-01 DIAGNOSIS — I10 Essential (primary) hypertension: Secondary | ICD-10-CM | POA: Diagnosis not present

## 2016-05-01 DIAGNOSIS — I509 Heart failure, unspecified: Secondary | ICD-10-CM | POA: Insufficient documentation

## 2016-05-01 DIAGNOSIS — N184 Chronic kidney disease, stage 4 (severe): Secondary | ICD-10-CM | POA: Diagnosis not present

## 2016-05-01 DIAGNOSIS — I5032 Chronic diastolic (congestive) heart failure: Secondary | ICD-10-CM | POA: Insufficient documentation

## 2016-05-01 DIAGNOSIS — I130 Hypertensive heart and chronic kidney disease with heart failure and stage 1 through stage 4 chronic kidney disease, or unspecified chronic kidney disease: Secondary | ICD-10-CM | POA: Insufficient documentation

## 2016-05-01 DIAGNOSIS — I129 Hypertensive chronic kidney disease with stage 1 through stage 4 chronic kidney disease, or unspecified chronic kidney disease: Secondary | ICD-10-CM | POA: Diagnosis not present

## 2016-05-01 DIAGNOSIS — E785 Hyperlipidemia, unspecified: Secondary | ICD-10-CM | POA: Diagnosis not present

## 2016-05-01 DIAGNOSIS — N179 Acute kidney failure, unspecified: Secondary | ICD-10-CM | POA: Diagnosis not present

## 2016-05-01 DIAGNOSIS — E1159 Type 2 diabetes mellitus with other circulatory complications: Secondary | ICD-10-CM | POA: Diagnosis not present

## 2016-05-01 DIAGNOSIS — G4762 Sleep related leg cramps: Secondary | ICD-10-CM

## 2016-05-01 DIAGNOSIS — Z7982 Long term (current) use of aspirin: Secondary | ICD-10-CM | POA: Insufficient documentation

## 2016-05-01 DIAGNOSIS — E1165 Type 2 diabetes mellitus with hyperglycemia: Secondary | ICD-10-CM | POA: Diagnosis not present

## 2016-05-01 DIAGNOSIS — E118 Type 2 diabetes mellitus with unspecified complications: Secondary | ICD-10-CM

## 2016-05-01 DIAGNOSIS — E1122 Type 2 diabetes mellitus with diabetic chronic kidney disease: Secondary | ICD-10-CM | POA: Diagnosis not present

## 2016-05-01 DIAGNOSIS — Z87891 Personal history of nicotine dependence: Secondary | ICD-10-CM | POA: Insufficient documentation

## 2016-05-01 DIAGNOSIS — Z794 Long term (current) use of insulin: Secondary | ICD-10-CM | POA: Insufficient documentation

## 2016-05-01 DIAGNOSIS — IMO0001 Reserved for inherently not codable concepts without codable children: Secondary | ICD-10-CM | POA: Diagnosis present

## 2016-05-01 DIAGNOSIS — N189 Chronic kidney disease, unspecified: Secondary | ICD-10-CM | POA: Diagnosis not present

## 2016-05-01 DIAGNOSIS — K219 Gastro-esophageal reflux disease without esophagitis: Secondary | ICD-10-CM | POA: Diagnosis not present

## 2016-05-01 LAB — BASIC METABOLIC PANEL
Anion gap: 8 (ref 5–15)
BUN: 46 mg/dL — ABNORMAL HIGH (ref 6–20)
CALCIUM: 9.4 mg/dL (ref 8.9–10.3)
CHLORIDE: 104 mmol/L (ref 101–111)
CO2: 25 mmol/L (ref 22–32)
CREATININE: 3.67 mg/dL — AB (ref 0.61–1.24)
GFR calc non Af Amer: 19 mL/min — ABNORMAL LOW (ref >60)
GFR, EST AFRICAN AMERICAN: 22 mL/min — AB (ref >60)
Glucose, Bld: 183 mg/dL — ABNORMAL HIGH (ref 65–99)
Potassium: 4.5 mmol/L (ref 3.5–5.1)
SODIUM: 137 mmol/L (ref 135–145)

## 2016-05-01 LAB — CBC WITH DIFFERENTIAL/PLATELET
BASOS PCT: 0 %
Basophils Absolute: 0 10*3/uL (ref 0.0–0.1)
EOS ABS: 0.2 10*3/uL (ref 0.0–0.7)
Eosinophils Relative: 1 %
HCT: 46.1 % (ref 39.0–52.0)
HEMOGLOBIN: 14.8 g/dL (ref 13.0–17.0)
Lymphocytes Relative: 29 %
Lymphs Abs: 3.6 10*3/uL (ref 0.7–4.0)
MCH: 25.3 pg — ABNORMAL LOW (ref 26.0–34.0)
MCHC: 32.1 g/dL (ref 30.0–36.0)
MCV: 78.8 fL (ref 78.0–100.0)
MONOS PCT: 4 %
Monocytes Absolute: 0.5 10*3/uL (ref 0.1–1.0)
NEUTROS PCT: 66 %
Neutro Abs: 8.1 10*3/uL — ABNORMAL HIGH (ref 1.7–7.7)
Platelets: 236 10*3/uL (ref 150–400)
RBC: 5.85 MIL/uL — ABNORMAL HIGH (ref 4.22–5.81)
RDW: 14.2 % (ref 11.5–15.5)
WBC: 12.4 10*3/uL — ABNORMAL HIGH (ref 4.0–10.5)

## 2016-05-01 LAB — GLUCOSE, CAPILLARY
GLUCOSE-CAPILLARY: 180 mg/dL — AB (ref 65–99)
Glucose-Capillary: 305 mg/dL — ABNORMAL HIGH (ref 65–99)

## 2016-05-01 LAB — CK: CK TOTAL: 562 U/L — AB (ref 49–397)

## 2016-05-01 LAB — TROPONIN I: TROPONIN I: 0.05 ng/mL — AB (ref <0.03)

## 2016-05-01 LAB — MAGNESIUM: MAGNESIUM: 1.9 mg/dL (ref 1.7–2.4)

## 2016-05-01 MED ORDER — AMLODIPINE BESYLATE 10 MG PO TABS: 10.0000 mg | ORAL_TABLET | Freq: Every day | ORAL | 0 refills | Status: DC

## 2016-05-01 MED ORDER — SODIUM CHLORIDE 0.9 % IV SOLN
INTRAVENOUS | Status: DC
  Administered 2016-05-01: 1000 mL via INTRAVENOUS

## 2016-05-01 MED ORDER — ROPINIROLE HCL 0.25 MG PO TABS
0.2500 mg | ORAL_TABLET | Freq: Every day | ORAL | Status: DC
  Filled 2016-05-01: qty 1

## 2016-05-01 MED ORDER — HEPARIN SODIUM (PORCINE) 5000 UNIT/ML IJ SOLN
5000.0000 [IU] | Freq: Three times a day (TID) | INTRAMUSCULAR | Status: DC
  Administered 2016-05-01: 5000 [IU] via SUBCUTANEOUS
  Filled 2016-05-01: qty 1

## 2016-05-01 MED ORDER — DEXTROSE 50 % IV SOLN
INTRAVENOUS | Status: DC
Start: 2016-05-01 — End: 2016-05-01
  Filled 2016-05-01: qty 50

## 2016-05-01 MED ORDER — CARVEDILOL 25 MG PO TABS
25.0000 mg | ORAL_TABLET | Freq: Two times a day (BID) | ORAL | Status: DC
  Administered 2016-05-01: 25 mg via ORAL
  Filled 2016-05-01: qty 2

## 2016-05-01 MED ORDER — SODIUM CHLORIDE 0.9 % IV BOLUS (SEPSIS)
1000.0000 mL | Freq: Once | INTRAVENOUS | Status: AC
  Administered 2016-05-01: 1000 mL via INTRAVENOUS

## 2016-05-01 MED ORDER — AMLODIPINE BESYLATE 10 MG PO TABS: 10.0000 mg | ORAL_TABLET | Freq: Every day | ORAL | Status: DC

## 2016-05-01 MED ORDER — ROPINIROLE HCL 0.25 MG PO TABS: 0.2500 mg | ORAL_TABLET | Freq: Every day | ORAL | 0 refills | Status: DC

## 2016-05-01 MED ORDER — ASPIRIN EC 81 MG PO TBEC
81.0000 mg | DELAYED_RELEASE_TABLET | Freq: Every day | ORAL | Status: DC
  Administered 2016-05-01: 81 mg via ORAL
  Filled 2016-05-01: qty 1

## 2016-05-01 MED ORDER — CYCLOBENZAPRINE HCL 5 MG PO TABS: 5.0000 mg | ORAL_TABLET | Freq: Every evening | ORAL | Status: DC | PRN

## 2016-05-01 MED ORDER — ISOSORBIDE MONONITRATE 15 MG HALF TABLET
15.0000 mg | ORAL_TABLET | Freq: Every day | ORAL | Status: DC
  Administered 2016-05-01: 15 mg via ORAL
  Filled 2016-05-01: qty 1

## 2016-05-01 MED ORDER — INSULIN GLARGINE 100 UNIT/ML ~~LOC~~ SOLN
40.0000 [IU] | Freq: Every day | SUBCUTANEOUS | Status: DC
  Filled 2016-05-01: qty 0.4

## 2016-05-01 MED ORDER — AMLODIPINE BESYLATE 5 MG PO TABS
5.0000 mg | ORAL_TABLET | Freq: Every day | ORAL | Status: DC
  Administered 2016-05-01 (×2): 5 mg via ORAL
  Filled 2016-05-01 (×2): qty 1

## 2016-05-01 MED ORDER — INSULIN ASPART 100 UNIT/ML ~~LOC~~ SOLN
10.0000 [IU] | Freq: Three times a day (TID) | SUBCUTANEOUS | Status: DC
  Administered 2016-05-01: 8 [IU] via SUBCUTANEOUS
  Administered 2016-05-01: 10 [IU] via SUBCUTANEOUS

## 2016-05-01 NOTE — ED Notes (Signed)
This RN attempted an IV x 2 without success.

## 2016-05-01 NOTE — Discharge Summary (Signed)
Hector Mahoney, is a 45 y.o. male  DOB [birth year]  MRN [id number].  Admission date:  05/01/2016  Admitting Physician  Etta Quill, DO  Discharge Date:  05/01/2016   Primary MD  Cathlean Cower, MD  Recommendations for primary care physician for things to follow:  - Please check CBC, BMP and uric next visit - Patient instructed to follow with his primary nephrologist Dr. Kathe Mariner in 2 weeks. - Please reassess resumption of Lipitor if leg pain has improved after starting Requip.  Admission Diagnosis  Acute on chronic renal failure (HCC) [N17.9, N18.9] Cramp of both lower extremities [R25.2]   Discharge Diagnosis  Acute on chronic renal failure (HCC) [N17.9, N18.9] Cramp of both lower extremities [R25.2]    Active Problems:   Essential hypertension   Poorly controlled type 2 diabetes mellitus with circulatory disorder (HCC)   CKD stage 4 due to type 2 diabetes mellitus (HCC)   Nocturnal leg cramps      Past Medical History:  Diagnosis Date  . ABSCESS, TOOTH 04/23/2009  . AKI (acute kidney injury) ([place]) 08/2015  . CHF (congestive heart failure) ([place])   . DIABETES MELLITUS, TYPE I 07/30/2007  . Esophageal reflux 04/27/2009  . FATIGUE 04/27/2009  . HYPERLIPIDEMIA 04/24/2007  . HYPERTENSION 04/24/2007  . INSOMNIA-SLEEP DISORDER-UNSPEC 04/27/2009  . LUMBAR STRAIN, ACUTE 12/11/2008  . RASH-NONVESICULAR 03/11/2008  . Shortness of breath    "lying down and w/exertion right now" (12/22/2012)    Past Surgical History:  Procedure Laterality Date  . MOUTH SURGERY         History of present illness and  Hospital Course:     Kindly see H&P for history of present illness and admission details, please review complete Labs, Consult reports and Test reports for all details in brief  HPI  from the history and physical done on the day of admission 05/01/2016  HPI: Hector Mahoney is a 45 y.o. male with  medical history significant of CHF (grade 2 diastolic dysfunction), DM2 for 19 years with poor control and A1C of 10.9 last week, HTN also poorly controlled, HLD on lipitor.  Patient presents to the ED with c/o recurrent BLE leg cramps.  Symptoms are episodic and ongoing for past couple of months.  Current symptoms onset 11pm this evening.  Pain is cramping and goes between 2/10 and 10/10.  Takes flexeril at 1130 with improvement.  ED Course: Creatinine is 3.6 up from 2.9 back in April and 2.3 in Jan.  Hospital Course   Acute on CKD stage IV - Agent with progressive decline in renal function, this is most likely due to poorly controlled diabetes mellitus and hypertension, scars with patient at length multiple times about importance of compliance of diabetes mellitus and hypertensive medication and low-salt diet. - Discussed with renal, Dr. Lorrene Reid, she reviewed his most recent creatinine was 3.1 in May 2017, there is no evidence of rhabdo, total CK is only 570. - A shunt was kept with instrumentation during hospital stay, started to resume  Lasix on discharge.  Nocturnal leg cramps - I think this is most likely related to restless leg syndrome, he was started on Requip, but will hold his Lipitor on discharge, and if his leg cramps improved in couple weeks, would recommend resumption of Lipitor  Hypertension - Uncontrolled on admission, to continue Imdur and Coreg at current dose, patient amlodipine was increased from 5-10 mg oral daily.  Diabetes mellitus 2 - Patient  following with renal as an outpatient, recently changed from Novolin 70/30 to lantus  insulin and pre-meal NovoLog, started to continue his current regimen and to follow with his endocrinologist and to keep logbook     Discharge Condition:  Stable   Follow UP  Follow-up Information    Cathlean Cower, MD Follow up in 1 week(s).   Specialties:  Internal Medicine, Radiology Contact information: [place] [address] [phone]        Donetta Potts, MD Follow up in 2 week(s).   Specialty:  Nephrology Contact information: [place] [address] [phone]             Discharge Instructions  and  Discharge Medications     Discharge Instructions    Discharge instructions    Complete by:  As directed    Follow with Primary MD Cathlean Cower, MD in 7 days   Get CBC, CMP, checked  by Primary MD next visit.    Activity: As tolerated with Full fall precautions use walker/cane & assistance as needed   Disposition Home    Diet: Heart Healthy , low-salt, carb modified .  For Heart failure patients - Check your Weight same time everyday, if you gain over 2 pounds, or you develop in leg swelling, experience more shortness of breath or chest pain, call your Primary MD immediately. Follow Cardiac Low Salt Diet and 1.5 lit/day fluid restriction.   On your next visit with your primary care physician please Get Medicines reviewed and adjusted.   Please request your Prim.MD to go over all Hospital Tests and Procedure/Radiological results at the follow up, please get all Hospital records sent to your Prim MD by signing hospital release before you go home.   If you experience worsening of your admission symptoms, develop shortness of breath, life threatening emergency, suicidal or homicidal thoughts you must seek medical attention immediately by calling 911 or calling your MD immediately  if symptoms less severe.  You Must read complete instructions/literature along with all the possible adverse reactions/side effects for all the Medicines you take and that have been prescribed to you. Take any new Medicines after you have completely understood and accpet all the possible adverse reactions/side effects.   Do not drive, operating heavy machinery, perform activities at heights, swimming or participation in water activities or provide baby sitting services if  your were admitted for syncope or siezures until you have seen by Primary MD or a Neurologist and advised to do so again.  Do not drive when taking Pain medications.    Do not take more than prescribed Pain, Sleep and Anxiety Medications  Special Instructions: If you have smoked or chewed Tobacco  in the last 2 yrs please stop smoking, stop any regular Alcohol  and or any Recreational drug use.  Wear Seat belts while driving.   Please note  You were cared for by a hospitalist during your hospital stay. If you have any questions about your discharge medications or the care you received  while you were in the hospital after you are discharged, you can call the unit and asked to speak with the hospitalist on call if the hospitalist that took care of you is not available. Once you are discharged, your primary care physician will handle any further medical issues. Please note that NO REFILLS for any discharge medications will be authorized once you are discharged, as it is imperative that you return to your primary care physician (or establish a relationship with a primary care physician if you do not have one) for your aftercare needs so that they can reassess your need for medications and monitor your lab values.   Discharge instructions    Complete by:  As directed    Follow with Primary MD Cathlean Cower, MD in 7 days   Get CBC, CMP, checked  by Primary MD next visit.    Activity: As tolerated with Full fall precautions use walker/cane & assistance as needed   Disposition Home    Diet: Heart Healthy , low-salt, carb modified .  For Heart failure patients - Check your Weight same time everyday, if you gain over 2 pounds, or you develop in leg swelling, experience more shortness of breath or chest pain, call your Primary MD immediately. Follow Cardiac Low Salt Diet and 1.5 lit/day fluid restriction.   On your next visit with your primary care physician please Get Medicines reviewed and  adjusted.   Please request your Prim.MD to go over all Hospital Tests and Procedure/Radiological results at the follow up, please get all Hospital records sent to your Prim MD by signing hospital release before you go home.   If you experience worsening of your admission symptoms, develop shortness of breath, life threatening emergency, suicidal or homicidal thoughts you must seek medical attention immediately by calling 911 or calling your MD immediately  if symptoms less severe.  You Must read complete instructions/literature along with all the possible adverse reactions/side effects for all the Medicines you take and that have been prescribed to you. Take any new Medicines after you have completely understood and accpet all the possible adverse reactions/side effects.   Do not drive, operating heavy machinery, perform activities at heights, swimming or participation in water activities or provide baby sitting services if your were admitted for syncope or siezures until you have seen by Primary MD or a Neurologist and advised to do so again.  Do not drive when taking Pain medications.    Do not take more than prescribed Pain, Sleep and Anxiety Medications  Special Instructions: If you have smoked or chewed Tobacco  in the last 2 yrs please stop smoking, stop any regular Alcohol  and or any Recreational drug use.  Wear Seat belts while driving.   Please note  You were cared for by a hospitalist during your hospital stay. If you have any questions about your discharge medications or the care you received while you were in the hospital after you are discharged, you can call the unit and asked to speak with the hospitalist on call if the hospitalist that took care of you is not available. Once you are discharged, your primary care physician will handle any further medical issues. Please note that NO REFILLS for any discharge medications will be authorized once you are discharged, as it is  imperative that you return to your primary care physician (or establish a relationship with a primary care physician if you do not have one) for your aftercare needs so that they can  reassess your need for medications and monitor your lab values.   Increase activity slowly    Complete by:  As directed    Increase activity slowly    Complete by:  As directed        Medication List    STOP taking these medications   atorvastatin 40 MG tablet Commonly known as:  LIPITOR     TAKE these medications   amLODipine 10 MG tablet Commonly known as:  NORVASC Take 1 tablet (10 mg total) by mouth daily. Start taking on:  05/02/2016 What changed:  medication strength  how much to take   aspirin 81 MG EC tablet Take 1 tablet (81 mg total) by mouth daily.   carvedilol 25 MG tablet Commonly known as:  COREG Take 1 tablet (25 mg total) by mouth 2 (two) times daily.   cyclobenzaprine 5 MG tablet Commonly known as:  FLEXERIL Take 1 tablet (5 mg total) by mouth at bedtime. What changed:  when to take this  reasons to take this   furosemide 40 MG tablet Commonly known as:  LASIX 1 tab by mouth every M-W-F, and other days as needed if wt increased 2 lbs   insulin aspart 100 UNIT/ML injection Commonly known as:  novoLOG Inject 8-12 Units into the skin 3 (three) times daily with meals.   insulin glargine 100 UNIT/ML injection Commonly known as:  LANTUS Inject 0.4 mLs (40 Units total) into the skin at bedtime.   Insulin Syringe-Needle U-100 31G X 5/16" 0.5 ML Misc Commonly known as:  B-D INS SYRINGE 0.5CC/31GX5/16 Use 4x a day   Insulin Syringe-Needle U-100 31G X 15/64" 0.5 ML Misc Commonly known as:  RELION INSULIN SYRINGE Use 4 times daily.   isosorbide mononitrate 30 MG 24 hr tablet Commonly known as:  IMDUR Take 0.5 tablets (15 mg total) by mouth daily.   rOPINIRole 0.25 MG tablet Commonly known as:  REQUIP Take 1 tablet (0.25 mg total) by mouth at bedtime.          Diet and Activity recommendation: See Discharge Instructions above   Consults obtained -  None   Major procedures and Radiology Reports - PLEASE review detailed and final reports for all details, in brief -      No results found.  Micro Results     No results found for this or any previous visit (from the past 240 hour(s)).     Today   Subjective:   Hector Mahoney today has no headache,no chest abdominal pain,no new weakness tingling or numbness, feels much better wants to go home today.   Objective:   Blood pressure 135/78, pulse 86, temperature 98.2 F (36.8 C), temperature source Oral, resp. rate 19, height 5\' 7"  (1.702 m), weight 95.3 kg (210 lb), SpO2 97 %.   Intake/Output Summary (Last 24 hours) at 05/01/16 1530 Last data filed at 05/01/16 1354  Gross per 24 hour  Intake              480 ml  Output             1350 ml  Net             -870 ml    Exam Awake Alert, Oriented x 3, No new F.N deficits, Normal affect [place].AT,PERRAL Supple Neck,No JVD, No cervical lymphadenopathy appriciated.  Symmetrical Chest wall movement, Good air movement bilaterally, CTAB RRR,No Gallops,Rubs or new Murmurs, No Parasternal Heave +ve B.Sounds, Abd Soft, Non tender, No organomegaly appriciated,  No rebound -guarding or rigidity. No Cyanosis, Clubbing or edema, No new Rash or bruise  Data Review   CBC w Diff: Lab Results  Component Value Date   WBC 12.4 (H) 05/01/2016   HGB 14.8 05/01/2016   HCT 46.1 05/01/2016   PLT 236 05/01/2016   LYMPHOPCT 29 05/01/2016   MONOPCT 4 05/01/2016   EOSPCT 1 05/01/2016   BASOPCT 0 05/01/2016    CMP: Lab Results  Component Value Date   NA 137 05/01/2016   K 4.5 05/01/2016   CL 104 05/01/2016   CO2 25 05/01/2016   BUN 46 (H) 05/01/2016   CREATININE 3.67 (H) 05/01/2016   PROT 7.7 03/29/2014   ALBUMIN 3.4 (L) 03/29/2014   BILITOT 0.5 03/29/2014   ALKPHOS 88 03/29/2014   AST 26 03/29/2014   ALT 21 03/29/2014   .   Total Time in preparing paper work, data evaluation and todays exam - 35 minutes  Ellawyn Wogan M.D on 05/01/2016 at 3:30 PM  [facility]   Office  [phone]

## 2016-05-01 NOTE — ED Notes (Signed)
Dr. Alcario Drought notified of pt's BP- 176/111.

## 2016-05-01 NOTE — ED Provider Notes (Signed)
By signing my name below, I, Maud Deed. Royston Sinner, attest that this documentation has been prepared under the direction and in the presence of Silt, DO.  Electronically Signed: Maud Deed. Royston Sinner, ED Scribe. 05/01/16. 2:37 AM.   TIME SEEN: 2:30 AM   CHIEF COMPLAINT:  Chief Complaint  Patient presents with  . Leg Pain    HPI:  HPI Comments: Hector Mahoney, brought in by EMS is a 45 y.o. male with a PMHx of CHF, DM, hyperlipidemia, and HTN who presents to the Emergency Department complaining of recurrent, constant, improving bilateral leg pain onset 11:00 PM this evening. Pain is described as cramping. Currently discomfort is rates 2/10 improved from 10/10. No aggravating or alleviating factors at this time. Pt states he took a few tablespoons of mustard and a dose of Flexeril at approximately 11:30 PM with improvement. No recent fever, chills, nausea, vomiting, abdominal pain, chest pain, or shortness of breath. Pt admits to a history of same and states "cramping" has been intermittent for the last 6 months.  PCP: Cathlean Cower, MD     ROS: See HPI Constitutional: no fever  Eyes: no drainage  ENT: no runny nose   Cardiovascular:  no chest pain  Resp: no SOB  GI: no vomiting GU: no dysuria Integumentary: no rash  Allergy: no hives  Musculoskeletal: no leg swelling. Positive Leg pain Neurological: no slurred speech ROS otherwise negative  PAST MEDICAL HISTORY/PAST SURGICAL HISTORY:  Past Medical History:  Diagnosis Date  . ABSCESS, TOOTH 04/23/2009  . AKI (acute kidney injury) ([place]) 08/2015  . CHF (congestive heart failure) ([place])   . DIABETES MELLITUS, TYPE I 07/30/2007  . Esophageal reflux 04/27/2009  . FATIGUE 04/27/2009  . HYPERLIPIDEMIA 04/24/2007  . HYPERTENSION 04/24/2007  . INSOMNIA-SLEEP DISORDER-UNSPEC 04/27/2009  . LUMBAR STRAIN, ACUTE 12/11/2008  . RASH-NONVESICULAR 03/11/2008  . Shortness of breath    "lying down and w/exertion right now" (12/22/2012)     MEDICATIONS:  Prior to Admission medications   Medication Sig Start Date End Date Taking? Authorizing Provider  amLODipine (NORVASC) 5 MG tablet Take 1 tablet (5 mg total) by mouth daily. 09/06/15   Nita Sells, MD  aspirin EC 81 MG EC tablet Take 1 tablet (81 mg total) by mouth daily. 09/06/15   Nita Sells, MD  atorvastatin (LIPITOR) 40 MG tablet Take 40 mg by mouth daily at 6 PM.    Historical Provider, MD  carvedilol (COREG) 25 MG tablet Take 1 tablet (25 mg total) by mouth 2 (two) times daily. 09/06/15   Nita Sells, MD  cyclobenzaprine (FLEXERIL) 5 MG tablet Take 1 tablet (5 mg total) by mouth at bedtime. 04/03/16   Biagio Borg, MD  furosemide (LASIX) 40 MG tablet 1 tab by mouth every M-W-F, and other days as needed if wt increased 2 lbs 03/01/16   Biagio Borg, MD  insulin aspart (NOVOLOG) 100 UNIT/ML injection Inject 8-12 Units into the skin 3 (three) times daily with meals. 04/25/16   Philemon Kingdom, MD  insulin glargine (LANTUS) 100 UNIT/ML injection Inject 0.4 mLs (40 Units total) into the skin at bedtime. 04/25/16   Philemon Kingdom, MD  Insulin Syringe-Needle U-100 (B-D INS SYRINGE 0.5CC/31GX5/16) 31G X 5/16" 0.5 ML MISC Use 4x a day 04/25/16   Philemon Kingdom, MD  Insulin Syringe-Needle U-100 (RELION INSULIN SYRINGE) 31G X 15/64" 0.5 ML MISC Use 4 times daily. 04/26/16   Philemon Kingdom, MD  isosorbide mononitrate (IMDUR) 30 MG 24 hr tablet Take 0.5  tablets (15 mg total) by mouth daily. 11/23/15   Biagio Borg, MD    ALLERGIES:  No Known Allergies  SOCIAL HISTORY:  Social History  Substance Use Topics  . Smoking status: Former Smoker    Types: Cigarettes    Quit date: 05/03/2015  . Smokeless tobacco: Never Used     Comment: 12/22/2012 "used to smoke cigarettes once or twice/month; haven't had any cigarettes for years"  . Alcohol use 1.2 oz/week    1 Cans of beer, 1 Shots of liquor per week     Comment: 12/22/2012 "once mixed or one beer 2 times/week"     FAMILY HISTORY: Family History  Problem Relation Age of Onset  . Hypertension Mother     EXAM: BP (!) 160/108 (BP Location: Left Arm)   Pulse 90   Temp 98.1 F (36.7 C) (Oral)   Resp 18   Ht 5\' 7"  (1.702 m)   Wt 210 lb (95.3 kg)   SpO2 97%   BMI 32.89 kg/m  CONSTITUTIONAL: Alert and oriented and responds appropriately to questions. Well-appearing; well-nourished HEAD: Normocephalic EYES: Conjunctivae clear, PERRL ENT: normal nose; no rhinorrhea; moist mucous membranes NECK: Supple, no meningismus, no LAD  CARD: RRR; S1 and S2 appreciated; no murmurs, no clicks, no rubs, no gallops RESP: Normal chest excursion without splinting or tachypnea; breath sounds clear and equal bilaterally; no wheezes, no rhonchi, no rales, no hypoxia or respiratory distress, speaking full sentences ABD/GI: Normal bowel sounds; non-distended; soft, non-tender, no rebound, no guarding, no peritoneal signs BACK:  The back appears normal and is non-tender to palpation, there is no CVA tenderness EXT: Normal ROM in all joints; non-tender to palpation; no edema; normal capillary refill; no cyanosis, no calf tenderness or swelling, 2+ DP pulses bilaterally    SKIN: Normal color for age and race; warm; no rash NEURO: Moves all extremities equally, sensation to light touch intact diffusely, cranial nerves II through XII intact PSYCH: The patient's mood and manner are appropriate. Grooming and personal hygiene are appropriate.  MEDICAL DECISION MAKING: Patient here with bilateral leg cramps. Has had similar symptoms intermittently for 6 months but states that this was the worst episode. No injury to the legs. No calf swelling or tenderness on exam. No signs of cellulitis. Took Flexeril prior to arrival reports feeling better. Was told in the past that this could be "low potassium" but has not had his potassium checked recently.  We'll check basic labs to ensure there is no change in his potassium, magnesium. He  declines further pain medication.  ED PROGRESS: Labs show normal potassium, magnesium. He does however have elevated creatinine from baseline. He is also hypertensive.  Given his leg pain and worsening renal function, we'll also check a CK level. I feel he will need admission for evaluation for his acute on chronic renal failure. PCP is Dr. Cathlean Cower.  4:20 AM  Discussed patient's case with hospitalist, Dr. Alcario Drought.  He will see patient for admission and place holding orders. Patient and family have been updated with this plan.  I reviewed all nursing notes, vitals, pertinent old records, EKGs, labs, imaging (as available).      I personally performed the services described in this documentation, which was scribed in my presence. The recorded information has been reviewed and is accurate.    [place], DO 05/01/16 [phone]

## 2016-05-01 NOTE — Progress Notes (Signed)
Nsg Discharge Note  Admit Date:  05/01/2016 Discharge date: 05/01/2016   [name] to be D/C'd Home per MD order.  AVS completed.  Copy for chart, and copy for patient signed, and dated. Patient/caregiver able to verbalize understanding.  Discharge Medication:   Medication List    STOP taking these medications   atorvastatin 40 MG tablet Commonly known as:  LIPITOR     TAKE these medications   amLODipine 10 MG tablet Commonly known as:  NORVASC Take 1 tablet (10 mg total) by mouth daily. Start taking on:  05/02/2016 What changed:  medication strength  how much to take   aspirin 81 MG EC tablet Take 1 tablet (81 mg total) by mouth daily.   carvedilol 25 MG tablet Commonly known as:  COREG Take 1 tablet (25 mg total) by mouth 2 (two) times daily.   cyclobenzaprine 5 MG tablet Commonly known as:  FLEXERIL Take 1 tablet (5 mg total) by mouth at bedtime. What changed:  when to take this  reasons to take this   furosemide 40 MG tablet Commonly known as:  LASIX 1 tab by mouth every M-W-F, and other days as needed if wt increased 2 lbs   insulin aspart 100 UNIT/ML injection Commonly known as:  novoLOG Inject 8-12 Units into the skin 3 (three) times daily with meals.   insulin glargine 100 UNIT/ML injection Commonly known as:  LANTUS Inject 0.4 mLs (40 Units total) into the skin at bedtime.   Insulin Syringe-Needle U-100 31G X 5/16" 0.5 ML Misc Commonly known as:  B-D INS SYRINGE 0.5CC/31GX5/16 Use 4x a day   Insulin Syringe-Needle U-100 31G X 15/64" 0.5 ML Misc Commonly known as:  RELION INSULIN SYRINGE Use 4 times daily.   isosorbide mononitrate 30 MG 24 hr tablet Commonly known as:  IMDUR Take 0.5 tablets (15 mg total) by mouth daily.   rOPINIRole 0.25 MG tablet Commonly known as:  REQUIP Take 1 tablet (0.25 mg total) by mouth at bedtime.       Discharge Assessment: Vitals:   05/01/16 0939 05/01/16 1459  BP: 140/88 135/78  Pulse:  86   Resp:  19  Temp:  98.2 F (36.8 C)   Skin clean, dry and intact without evidence of skin break down, no evidence of skin tears noted. IV catheter discontinued intact. Site without signs and symptoms of complications - no redness or edema noted at insertion site, patient denies c/o pain - only slight tenderness at site.  Dressing with slight pressure applied.  D/c Instructions-Education: Discharge instructions given to patient/family with verbalized understanding. D/c education completed with patient/family including follow up instructions, medication list, d/c activities limitations if indicated, with other d/c instructions as indicated by MD - patient able to verbalize understanding, all questions fully answered. Patient instructed to return to ED, call 911, or call MD for any changes in condition.  Patient escorted via [place], and D/C home via private auto.  [place], Thornell Mule, RN 05/01/2016 4:42 PM

## 2016-05-01 NOTE — Discharge Instructions (Signed)
Follow with Primary MD Cathlean Cower, MD in 7 days   Get CBC, CMP, checked  by Primary MD next visit.    Activity: As tolerated with Full fall precautions use walker/cane & assistance as needed   Disposition Home    Diet: Heart Healthy , low-salt, carb modified .  For Heart failure patients - Check your Weight same time everyday, if you gain over 2 pounds, or you develop in leg swelling, experience more shortness of breath or chest pain, call your Primary MD immediately. Follow Cardiac Low Salt Diet and 1.5 lit/day fluid restriction.   On your next visit with your primary care physician please Get Medicines reviewed and adjusted.   Please request your Prim.MD to go over all Hospital Tests and Procedure/Radiological results at the follow up, please get all Hospital records sent to your Prim MD by signing hospital release before you go home.   If you experience worsening of your admission symptoms, develop shortness of breath, life threatening emergency, suicidal or homicidal thoughts you must seek medical attention immediately by calling 911 or calling your MD immediately  if symptoms less severe.  You Must read complete instructions/literature along with all the possible adverse reactions/side effects for all the Medicines you take and that have been prescribed to you. Take any new Medicines after you have completely understood and accpet all the possible adverse reactions/side effects.   Do not drive, operating heavy machinery, perform activities at heights, swimming or participation in water activities or provide baby sitting services if your were admitted for syncope or siezures until you have seen by Primary MD or a Neurologist and advised to do so again.  Do not drive when taking Pain medications.    Do not take more than prescribed Pain, Sleep and Anxiety Medications  Special Instructions: If you have smoked or chewed Tobacco  in the last 2 yrs please stop smoking, stop any  regular Alcohol  and or any Recreational drug use.  Wear Seat belts while driving.   Please note  You were cared for by a hospitalist during your hospital stay. If you have any questions about your discharge medications or the care you received while you were in the hospital after you are discharged, you can call the unit and asked to speak with the hospitalist on call if the hospitalist that took care of you is not available. Once you are discharged, your primary care physician will handle any further medical issues. Please note that NO REFILLS for any discharge medications will be authorized once you are discharged, as it is imperative that you return to your primary care physician (or establish a relationship with a primary care physician if you do not have one) for your aftercare needs so that they can reassess your need for medications and monitor your lab values.

## 2016-05-01 NOTE — Progress Notes (Signed)
Late entry for 0650:  Patient arrived to the floor, via stretcher, accompanied by his spouse, who is currently at bedside.  Received critical troponin level of 0.05 on patient.  Paged night on call and also day physician. No call back received. Patient oriented to room and call bell.  Bed alarm on for time being.  Safety maintained.

## 2016-05-01 NOTE — ED Notes (Signed)
Verbal order from Dr. Alcario Drought to give pt's morning Norvasc and Coreg at this time.

## 2016-05-01 NOTE — Care Management Note (Signed)
Case Management Note  Patient Details  Name: Hector Mahoney MRN: [id number] Date of Birth: [birth year]  Subjective/Objective:                 Patient in obs from home with intermittent  muscle cramps he has been experiencing for a while at home, DM2 with 19 year history currently poor controlled with A1C >10, AKI w/ Cr >3.5 baseline ~ 3.0. Receiving IVF. Nephrology to consult. Independent pta, lives with GF.    Action/Plan:  CM will continue to follow for DC needs.  Expected Discharge Date:                  Expected Discharge Plan:  Home/Self Care  In-House Referral:  NA  Discharge planning Services  CM Consult  Post Acute Care Choice:  NA Choice offered to:     DME Arranged:    DME Agency:     HH Arranged:    HH Agency:     Status of Service:  In process, will continue to follow  If discussed at Long Length of Stay Meetings, dates discussed:    Additional Comments:  Carles Collet, RN 05/01/2016, 10:43 AM

## 2016-05-01 NOTE — ED Triage Notes (Signed)
Per [facility]: Patient to ED from home c/o severe R leg cramping (hamstring and calf). Per EMS, pt has had this cramping off and on for 6 months in bilateral lower legs, but severely worsened in R leg tonight. Patient has seen PCP for it, possibly r/t hypokalemia per them d/t hx of renal failure, hx of DM and CHF as well. Patient able to stand on leg, but reports pain with ROM. Took muscle relaxer PTA, sts some relief. EMS VS: 175/126, CBG 162, HR 93 NSR. Denies CP/SOB, A&O x 4. Respirations e/u, skin warm/dry.

## 2016-05-01 NOTE — Progress Notes (Signed)
Inpatient Diabetes Program Recommendations  AACE/ADA: New Consensus Statement on Inpatient Glycemic Control (2015)  Target Ranges:  Prepandial:   less than 140 mg/dL      Peak postprandial:   less than 180 mg/dL (1-2 hours)      Critically ill patients:  140 - 180 mg/dL   Lab Results  Component Value Date   GLUCAP 180 (H) 05/01/2016   HGBA1C 10.9 04/25/2016   Review of Glycemic Control  Diabetes history: DM 2 Outpatient Diabetes medications: Lantus 40 units, Novolog 8-12 units TID with meals (8 units small meals, 10 units medium meals, 12 units large meals) Current orders for Inpatient glycemic control: Lantus 40 units, Novolog 8-12 units TID  Inpatient Diabetes Program Recommendations:   While inpatient, please discontinue home dose of Novolog and order Novolog Moderate correction TID + HS scale. May need to add meal coverage if glucose is elevated with correction scale.  Thanks,  Tama Headings RN, MSN, [facility] Inpatient Diabetes Coordinator Team Pager [phone] (8a-5p)

## 2016-05-01 NOTE — ED Notes (Signed)
Attempted report x1. 

## 2016-05-01 NOTE — H&P (Signed)
History and Physical    Hector Mahoney YPP:[id number] DOB: 03/17/1971 DOA: 05/01/2016   PCP: Cathlean Cower, MD Chief Complaint:  Chief Complaint  Patient presents with  . Leg Pain    HPI: Hector Mahoney is a 45 y.o. male with medical history significant of CHF (grade 2 diastolic dysfunction), DM2 for 19 years with poor control and A1C of 10.9 last week, HTN also poorly controlled, HLD on lipitor.  Patient presents to the ED with c/o recurrent BLE leg cramps.  Symptoms are episodic and ongoing for past couple of months.  Current symptoms onset 11pm this evening.  Pain is cramping and goes between 2/10 and 10/10.  Takes flexeril at 1130 with improvement.  ED Course: Creatinine is 3.6 up from 2.9 back in April and 2.3 in Jan.  Review of Systems: As per HPI otherwise 10 point review of systems negative.    Past Medical History:  Diagnosis Date  . ABSCESS, TOOTH 04/23/2009  . AKI (acute kidney injury) ([place]) 08/2015  . CHF (congestive heart failure) ([place])   . DIABETES MELLITUS, TYPE I 07/30/2007  . Esophageal reflux 04/27/2009  . FATIGUE 04/27/2009  . HYPERLIPIDEMIA 04/24/2007  . HYPERTENSION 04/24/2007  . INSOMNIA-SLEEP DISORDER-UNSPEC 04/27/2009  . LUMBAR STRAIN, ACUTE 12/11/2008  . RASH-NONVESICULAR 03/11/2008  . Shortness of breath    "lying down and w/exertion right now" (12/22/2012)    Past Surgical History:  Procedure Laterality Date  . MOUTH SURGERY       reports that he quit smoking about a year ago. His smoking use included Cigarettes. He has never used smokeless tobacco. He reports that he drinks about 1.2 oz of alcohol per week . He reports that he does not use drugs.  No Known Allergies  Family History  Problem Relation Age of Onset  . Hypertension Mother       Prior to Admission medications   Medication Sig Start Date End Date Taking? Authorizing Provider  amLODipine (NORVASC) 5 MG tablet Take 1 tablet (5 mg total) by mouth daily. 09/06/15  Yes Nita Sells, MD  aspirin EC 81 MG EC tablet Take 1 tablet (81 mg total) by mouth daily. 09/06/15  Yes Nita Sells, MD  atorvastatin (LIPITOR) 40 MG tablet Take 40 mg by mouth daily at 6 PM.   Yes Historical Provider, MD  carvedilol (COREG) 25 MG tablet Take 1 tablet (25 mg total) by mouth 2 (two) times daily. 09/06/15  Yes Nita Sells, MD  cyclobenzaprine (FLEXERIL) 5 MG tablet Take 1 tablet (5 mg total) by mouth at bedtime. Patient taking differently: Take 5 mg by mouth at bedtime as needed for muscle spasms.  04/03/16  Yes Biagio Borg, MD  furosemide (LASIX) 40 MG tablet 1 tab by mouth every M-W-F, and other days as needed if wt increased 2 lbs 03/01/16  Yes Biagio Borg, MD  insulin aspart (NOVOLOG) 100 UNIT/ML injection Inject 8-12 Units into the skin 3 (three) times daily with meals. 04/25/16  Yes Philemon Kingdom, MD  insulin glargine (LANTUS) 100 UNIT/ML injection Inject 0.4 mLs (40 Units total) into the skin at bedtime. 04/25/16  Yes Philemon Kingdom, MD  Insulin Syringe-Needle U-100 (B-D INS SYRINGE 0.5CC/31GX5/16) 31G X 5/16" 0.5 ML MISC Use 4x a day 04/25/16  Yes Philemon Kingdom, MD  Insulin Syringe-Needle U-100 (RELION INSULIN SYRINGE) 31G X 15/64" 0.5 ML MISC Use 4 times daily. 04/26/16  Yes Philemon Kingdom, MD  isosorbide mononitrate (IMDUR) 30 MG 24 hr tablet Take 0.5 tablets (  15 mg total) by mouth daily. 11/23/15  Yes Biagio Borg, MD    Physical Exam: Vitals:   05/01/16 0050  BP: (!) 160/108  Pulse: 90  Resp: 18  Temp: 98.1 F (36.7 C)  TempSrc: Oral  SpO2: 97%  Weight: 95.3 kg (210 lb)  Height: 5\' 7"  (1.702 m)      Constitutional: NAD, calm, comfortable Eyes: PERRL, lids and conjunctivae normal ENMT: Mucous membranes are moist. Posterior pharynx clear of any exudate or lesions.Normal dentition.  Neck: normal, supple, no masses, no thyromegaly Respiratory: clear to auscultation bilaterally, no wheezing, no crackles. Normal respiratory effort. No accessory  muscle use.  Cardiovascular: Regular rate and rhythm, no murmurs / rubs / gallops. No extremity edema. 2+ pedal pulses. No carotid bruits.  Abdomen: no tenderness, no masses palpated. No hepatosplenomegaly. Bowel sounds positive.  Musculoskeletal: no clubbing / cyanosis. No joint deformity upper and lower extremities. Good ROM, no contractures. Normal muscle tone.  Skin: no rashes, lesions, ulcers. No induration Neurologic: CN 2-12 grossly intact. Sensation intact, DTR normal. Strength 5/5 in all 4.  Psychiatric: Normal judgment and insight. Alert and oriented x 3. Normal mood.    Labs on Admission: I have personally reviewed following labs and imaging studies  CBC:  Recent Labs Lab 05/01/16 0324  WBC 12.4*  NEUTROABS 8.1*  HGB 14.8  HCT 46.1  MCV 78.8  PLT 734   Basic Metabolic Panel:  Recent Labs Lab 05/01/16 0324  NA 137  K 4.5  CL 104  CO2 25  GLUCOSE 183*  BUN 46*  CREATININE 3.67*  CALCIUM 9.4  MG 1.9   GFR: Estimated Creatinine Clearance: 28.6 mL/min (by C-G formula based on SCr of 3.67 mg/dL (H)). Liver Function Tests: No results for input(s): AST, ALT, ALKPHOS, BILITOT, PROT, ALBUMIN in the last 168 hours. No results for input(s): LIPASE, AMYLASE in the last 168 hours. No results for input(s): AMMONIA in the last 168 hours. Coagulation Profile: No results for input(s): INR, PROTIME in the last 168 hours. Cardiac Enzymes: No results for input(s): CKTOTAL, CKMB, CKMBINDEX, TROPONINI in the last 168 hours. BNP (last 3 results) No results for input(s): PROBNP in the last 8760 hours. HbA1C: No results for input(s): HGBA1C in the last 72 hours. CBG: No results for input(s): GLUCAP in the last 168 hours. Lipid Profile: No results for input(s): CHOL, HDL, LDLCALC, TRIG, CHOLHDL, LDLDIRECT in the last 72 hours. Thyroid Function Tests: No results for input(s): TSH, T4TOTAL, FREET4, T3FREE, THYROIDAB in the last 72 hours. Anemia Panel: No results for input(s):  VITAMINB12, FOLATE, FERRITIN, TIBC, IRON, RETICCTPCT in the last 72 hours. Urine analysis:    Component Value Date/Time   COLORURINE YELLOW 09/02/2015 [place] 09/02/2015 1158   LABSPEC 1.023 09/02/2015 1158   PHURINE 5.0 09/02/2015 1158   GLUCOSEU >1000 (A) 09/02/2015 1158   GLUCOSEU >=1000 04/27/2009 1531   HGBUR MODERATE (A) 09/02/2015 1158   HGBUR trace-intact 04/23/2009 0936   BILIRUBINUR NEGATIVE 09/02/2015 1158   KETONESUR NEGATIVE 09/02/2015 1158   PROTEINUR >300 (A) 09/02/2015 1158   UROBILINOGEN 0.2 03/29/2014 1729   NITRITE NEGATIVE 09/02/2015 1158   LEUKOCYTESUR NEGATIVE 09/02/2015 1158   Sepsis Labs: @LABRCNTIP (procalcitonin:4,lacticidven:4) )No results found for this or any previous visit (from the past 240 hour(s)).   Radiological Exams on Admission: No results found.  EKG: Independently reviewed.  Assessment/Plan Active Problems:   Essential hypertension   Poorly controlled type 2 diabetes mellitus with circulatory disorder ([place])  CKD stage 4 due to type 2 diabetes mellitus (HCC)   Nocturnal leg cramps    1. CKD stage 4 - 1. Creatinine worsening likely represents progression of CKD in setting of chronic uncontrolled DM and HTN. 2. Likely warrants nephrology consultation regarding timing and evaluation for dialysis access site. 3. Holding lasix for today, got 1L IVF in ED, resume lasix if CK isnt overly elevated (as I suspect it wont be). 2. Nocturnal leg cramps - 1. Holding Lipitor 2. Checking CK, but given the multi-month symptoms I doubt this represents severe acute rhabdo 3. HTN -  1. Continue home meds 4. DM2 - 1. Continue home insulin dosing which just got changed last week by [facility] Endocrinology. 2. Will check BGLs AC/HS   DVT prophylaxis: Heparin [place] Code Status: Full Family Communication: Family at bedside Consults called: None Admission status: Admit to obs   Hector Mahoney, [place] [facility] Pager [phone]  from 7PM-7AM  If 7AM-7PM, please contact the day physician for the patient [email or web address] Password [email or web address]  05/01/2016, 4:54 AM

## 2016-05-03 ENCOUNTER — Ambulatory Visit: Payer: Federal, State, Local not specified - PPO | Admitting: Internal Medicine

## 2016-05-04 ENCOUNTER — Ambulatory Visit (INDEPENDENT_AMBULATORY_CARE_PROVIDER_SITE_OTHER): Payer: Federal, State, Local not specified - PPO | Admitting: Internal Medicine

## 2016-05-04 ENCOUNTER — Encounter: Payer: Self-pay | Admitting: Internal Medicine

## 2016-05-04 VITALS — BP 124/78 | HR 101 | Temp 98.0°F | Resp 20 | Wt 208.0 lb

## 2016-05-04 DIAGNOSIS — E1122 Type 2 diabetes mellitus with diabetic chronic kidney disease: Secondary | ICD-10-CM

## 2016-05-04 DIAGNOSIS — J309 Allergic rhinitis, unspecified: Secondary | ICD-10-CM

## 2016-05-04 DIAGNOSIS — N184 Chronic kidney disease, stage 4 (severe): Secondary | ICD-10-CM

## 2016-05-04 DIAGNOSIS — I10 Essential (primary) hypertension: Secondary | ICD-10-CM

## 2016-05-04 DIAGNOSIS — E785 Hyperlipidemia, unspecified: Secondary | ICD-10-CM

## 2016-05-04 MED ORDER — PREDNISONE 10 MG PO TABS: ORAL_TABLET | ORAL | 0 refills | Status: DC

## 2016-05-04 MED ORDER — METHYLPREDNISOLONE ACETATE 80 MG/ML IJ SUSP
80.0000 mg | Freq: Once | INTRAMUSCULAR | Status: AC
Start: 2016-05-04 — End: 2016-05-04
  Administered 2016-05-04: 80 mg via INTRAMUSCULAR

## 2016-05-04 NOTE — Assessment & Plan Note (Signed)
Ok for off statin for now,  to f/u any worsening symptoms or concerns

## 2016-05-04 NOTE — Progress Notes (Signed)
Letter done

## 2016-05-04 NOTE — Progress Notes (Signed)
Pre visit review using our clinic review tool, if applicable. No additional management support is needed unless otherwise documented below in the visit note. 

## 2016-05-04 NOTE — Assessment & Plan Note (Signed)
stable overall by history and exam, recent data reviewed with pt, and pt to continue medical treatment as before,  to f/u any worsening symptoms or concerns BP Readings from Last 3 Encounters:  05/04/16 124/78  05/01/16 135/78  04/25/16 (!) 140/94

## 2016-05-04 NOTE — Assessment & Plan Note (Signed)
With recent improvement,  Lab Results  Component Value Date   CREATININE 3.67 (H) 05/01/2016  has long term f/u next appt next wk

## 2016-05-04 NOTE — Assessment & Plan Note (Signed)
Mild to mod, for depomedrol, predpac asd, zyrtec asd, nasacort asd,,  to f/u any worsening symptoms or concerns

## 2016-05-04 NOTE — Patient Instructions (Signed)
You had the steroid shot today  Please take all new medication as prescribed - the prednisone  Please also consider taking OTC zyrtec and nasacort for allergies  Please continue all other medications as before, and refills have been done if requested.  Please have the pharmacy call with any other refills you may need.  Please keep your appointments with your specialists as you may have planned

## 2016-05-04 NOTE — Progress Notes (Signed)
Subjective:    Patient ID: Hector Mahoney, male    DOB: 01/01/1971, 45 y.o.   MRN: [id number]  HPI  Here to f/u; overall doing ok,  Pt denies chest pain, increasing sob or doe, wheezing, orthopnea, PND, increased LE swelling, palpitations, dizziness or syncope.  Pt denies new neurological symptoms such as new headache, or facial or extremity weakness or numbness.  Pt denies polydipsia, polyuria, or low sugar episode.   Pt denies new neurological symptoms such as new headache, or facial or extremity weakness or numbness.   Pt states overall good compliance with meds, mostly trying to follow appropriate diet, with wt overall stable.,Does have several wks ongoing nasal allergy symptoms with clearish congestion, itch and sneezing, without fever, pain, ST, cough, swelling or wheezing.  Also recent tx for AKI on CKD tx with IVF, has f/u with renal next wk.  Statin held due to leg cramps in case this was a factor.  Pt reqeusts to not restart yet as cramping was so severe.  BS was 137 this am.   Past Medical History:  Diagnosis Date  . ABSCESS, TOOTH 04/23/2009  . AKI (acute kidney injury) ([place]) 08/2015  . CHF (congestive heart failure) ([place])   . DIABETES MELLITUS, TYPE I 07/30/2007  . Esophageal reflux 04/27/2009  . FATIGUE 04/27/2009  . HYPERLIPIDEMIA 04/24/2007  . HYPERTENSION 04/24/2007  . INSOMNIA-SLEEP DISORDER-UNSPEC 04/27/2009  . LUMBAR STRAIN, ACUTE 12/11/2008  . RASH-NONVESICULAR 03/11/2008  . Shortness of breath    "lying down and w/exertion right now" (12/22/2012)   Past Surgical History:  Procedure Laterality Date  . MOUTH SURGERY      reports that he quit smoking about a year ago. His smoking use included Cigarettes. He has never used smokeless tobacco. He reports that he drinks about 1.2 oz of alcohol per week . He reports that he does not use drugs. family history includes Hypertension in his mother. No Known Allergies Current Outpatient Prescriptions on File Prior to Visit    Medication Sig Dispense Refill  . amLODipine (NORVASC) 10 MG tablet Take 1 tablet (10 mg total) by mouth daily. 30 tablet 0  . aspirin EC 81 MG EC tablet Take 1 tablet (81 mg total) by mouth daily. 30 tablet 0  . carvedilol (COREG) 25 MG tablet Take 1 tablet (25 mg total) by mouth 2 (two) times daily. 60 tablet 11  . cyclobenzaprine (FLEXERIL) 5 MG tablet Take 1 tablet (5 mg total) by mouth at bedtime. (Patient taking differently: Take 5 mg by mouth at bedtime as needed for muscle spasms. ) 90 tablet 1  . furosemide (LASIX) 40 MG tablet 1 tab by mouth every M-W-F, and other days as needed if wt increased 2 lbs 90 tablet 3  . insulin aspart (NOVOLOG) 100 UNIT/ML injection Inject 8-12 Units into the skin 3 (three) times daily with meals. 2 vial 5  . insulin glargine (LANTUS) 100 UNIT/ML injection Inject 0.4 mLs (40 Units total) into the skin at bedtime. 20 mL 5  . Insulin Syringe-Needle U-100 (B-D INS SYRINGE 0.5CC/31GX5/16) 31G X 5/16" 0.5 ML MISC Use 4x a day 300 each 5  . Insulin Syringe-Needle U-100 (RELION INSULIN SYRINGE) 31G X 15/64" 0.5 ML MISC Use 4 times daily. 300 each 5  . isosorbide mononitrate (IMDUR) 30 MG 24 hr tablet Take 0.5 tablets (15 mg total) by mouth daily. 90 tablet 1  . rOPINIRole (REQUIP) 0.25 MG tablet Take 1 tablet (0.25 mg total) by mouth at bedtime. [place]  tablet 0   No current facility-administered medications on file prior to visit.     Review of Systems  Constitutional: Negative for unusual diaphoresis or night sweats HENT: Negative for ear swelling or discharge Eyes: Negative for worsening visual haziness  Respiratory: Negative for choking and stridor.   Gastrointestinal: Negative for distension or worsening eructation Genitourinary: Negative for retention or change in urine volume.  Musculoskeletal: Negative for other MSK pain or swelling Skin: Negative for color change and worsening wound Neurological: Negative for tremors and numbness other than noted   Psychiatric/Behavioral: Negative for decreased concentration or agitation other than above       Objective:   Physical Exam BP 124/78   Pulse (!) 101   Temp 98 F (36.7 C) (Oral)   Resp 20   Wt 208 lb (94.3 kg)   SpO2 98%   BMI 32.58 kg/m  VS noted,  Constitutional: Pt appears in no apparent distress HENT: Head: NCAT.  Right Ear: External ear normal.  Left Ear: External ear normal.  Bilat tm's with mild erythema.  Max sinus areas non tender.  Pharynx with mild erythema, no exudate Eyes: . Pupils are equal, round, and reactive to light. Conjunctivae and EOM are normal Neck: Normal range of motion. Neck supple.  Cardiovascular: Normal rate and regular rhythm.   Pulmonary/Chest: Effort normal and breath sounds without rales or wheezing.  Abd:  Soft, NT, ND, + BS Neurological: Pt is alert. Not confused , motor grossly intact Skin: Skin is warm. No rash, no LE edema Psychiatric: Pt behavior is normal. No agitation.     Assessment & Plan:

## 2016-05-08 DIAGNOSIS — N183 Chronic kidney disease, stage 3 (moderate): Secondary | ICD-10-CM | POA: Diagnosis not present

## 2016-05-08 DIAGNOSIS — E1129 Type 2 diabetes mellitus with other diabetic kidney complication: Secondary | ICD-10-CM | POA: Diagnosis not present

## 2016-05-08 DIAGNOSIS — E785 Hyperlipidemia, unspecified: Secondary | ICD-10-CM | POA: Diagnosis not present

## 2016-05-08 DIAGNOSIS — I129 Hypertensive chronic kidney disease with stage 1 through stage 4 chronic kidney disease, or unspecified chronic kidney disease: Secondary | ICD-10-CM | POA: Diagnosis not present

## 2016-06-02 ENCOUNTER — Encounter (HOSPITAL_COMMUNITY): Payer: Self-pay

## 2016-06-02 ENCOUNTER — Emergency Department (HOSPITAL_COMMUNITY)
Admission: EM | Admit: 2016-06-02 | Discharge: 2016-06-02 | Disposition: A | Discharge status: Home / Self Care | Payer: Federal, State, Local not specified - PPO | Attending: Emergency Medicine | Admitting: Emergency Medicine

## 2016-06-02 DIAGNOSIS — G4762 Sleep related leg cramps: Secondary | ICD-10-CM

## 2016-06-02 DIAGNOSIS — E1022 Type 1 diabetes mellitus with diabetic chronic kidney disease: Secondary | ICD-10-CM | POA: Diagnosis not present

## 2016-06-02 DIAGNOSIS — I10 Essential (primary) hypertension: Secondary | ICD-10-CM

## 2016-06-02 DIAGNOSIS — N289 Disorder of kidney and ureter, unspecified: Secondary | ICD-10-CM

## 2016-06-02 DIAGNOSIS — Z87891 Personal history of nicotine dependence: Secondary | ICD-10-CM | POA: Diagnosis not present

## 2016-06-02 DIAGNOSIS — E1165 Type 2 diabetes mellitus with hyperglycemia: Secondary | ICD-10-CM

## 2016-06-02 DIAGNOSIS — N189 Chronic kidney disease, unspecified: Secondary | ICD-10-CM

## 2016-06-02 DIAGNOSIS — Z7982 Long term (current) use of aspirin: Secondary | ICD-10-CM | POA: Diagnosis not present

## 2016-06-02 DIAGNOSIS — I5032 Chronic diastolic (congestive) heart failure: Secondary | ICD-10-CM | POA: Insufficient documentation

## 2016-06-02 DIAGNOSIS — E10649 Type 1 diabetes mellitus with hypoglycemia without coma: Secondary | ICD-10-CM | POA: Insufficient documentation

## 2016-06-02 DIAGNOSIS — N184 Chronic kidney disease, stage 4 (severe): Secondary | ICD-10-CM | POA: Insufficient documentation

## 2016-06-02 DIAGNOSIS — M79662 Pain in left lower leg: Secondary | ICD-10-CM | POA: Diagnosis present

## 2016-06-02 DIAGNOSIS — R252 Cramp and spasm: Secondary | ICD-10-CM | POA: Diagnosis not present

## 2016-06-02 DIAGNOSIS — I130 Hypertensive heart and chronic kidney disease with heart failure and stage 1 through stage 4 chronic kidney disease, or unspecified chronic kidney disease: Secondary | ICD-10-CM | POA: Insufficient documentation

## 2016-06-02 LAB — CBC WITH DIFFERENTIAL/PLATELET
BASOS ABS: 0.1 10*3/uL (ref 0.0–0.1)
Basophils Relative: 1 %
Eosinophils Absolute: 0.4 10*3/uL (ref 0.0–0.7)
Eosinophils Relative: 5 %
HEMATOCRIT: 36.1 % — AB (ref 39.0–52.0)
Hemoglobin: 11.6 g/dL — ABNORMAL LOW (ref 13.0–17.0)
LYMPHS ABS: 1.9 10*3/uL (ref 0.7–4.0)
LYMPHS PCT: 23 %
MCH: 24.7 pg — AB (ref 26.0–34.0)
MCHC: 32.1 g/dL (ref 30.0–36.0)
MCV: 76.8 fL — AB (ref 78.0–100.0)
MONO ABS: 0.6 10*3/uL (ref 0.1–1.0)
Monocytes Relative: 8 %
NEUTROS ABS: 5.1 10*3/uL (ref 1.7–7.7)
Neutrophils Relative %: 63 %
Platelets: 229 10*3/uL (ref 150–400)
RBC: 4.7 MIL/uL (ref 4.22–5.81)
RDW: 14.4 % (ref 11.5–15.5)
WBC: 8 10*3/uL (ref 4.0–10.5)

## 2016-06-02 LAB — COMPREHENSIVE METABOLIC PANEL
ALBUMIN: 3.4 g/dL — AB (ref 3.5–5.0)
ALT: 25 U/L (ref 17–63)
ANION GAP: 7 (ref 5–15)
AST: 20 U/L (ref 15–41)
Alkaline Phosphatase: 110 U/L (ref 38–126)
BUN: 51 mg/dL — AB (ref 6–20)
CHLORIDE: 107 mmol/L (ref 101–111)
CO2: 22 mmol/L (ref 22–32)
Calcium: 8.8 mg/dL — ABNORMAL LOW (ref 8.9–10.3)
Creatinine, Ser: 3.73 mg/dL — ABNORMAL HIGH (ref 0.61–1.24)
GFR calc Af Amer: 21 mL/min — ABNORMAL LOW (ref >60)
GFR calc non Af Amer: 18 mL/min — ABNORMAL LOW (ref >60)
GLUCOSE: 313 mg/dL — AB (ref 65–99)
POTASSIUM: 4.9 mmol/L (ref 3.5–5.1)
SODIUM: 136 mmol/L (ref 135–145)
TOTAL PROTEIN: 6.8 g/dL (ref 6.5–8.1)
Total Bilirubin: 0.6 mg/dL (ref 0.3–1.2)

## 2016-06-02 LAB — URINE MICROSCOPIC-ADD ON: Bacteria, UA: NONE SEEN

## 2016-06-02 LAB — URINALYSIS, ROUTINE W REFLEX MICROSCOPIC
Bilirubin Urine: NEGATIVE
GLUCOSE, UA: 500 mg/dL — AB
KETONES UR: NEGATIVE mg/dL
LEUKOCYTES UA: NEGATIVE
Nitrite: NEGATIVE
PH: 6 (ref 5.0–8.0)
Protein, ur: >300 mg/dL — AB
Specific Gravity, Urine: 1.014 (ref 1.005–1.030)

## 2016-06-02 LAB — TROPONIN I: Troponin I: <0.03 ng/mL (ref <0.03)

## 2016-06-02 MED ORDER — ROPINIROLE HCL 0.25 MG PO TABS: 0.2500 mg | ORAL_TABLET | Freq: Every day | ORAL | 0 refills | Status: DC

## 2016-06-02 NOTE — Discharge Instructions (Signed)
Try otc transdermal magnesium before bed.

## 2016-06-02 NOTE — ED Triage Notes (Signed)
Pt presents for evaluation of leg cramping. States has had recent visits to ED, PCP, and kidney dr regarding cramping x 3 months. States mainly in L leg, but occasionally in R. Pt. States cramping occurs mostly at night, states was initially unable to bear weight on L leg this morning. Pt. Did ambulate from parking lot with no assistance.

## 2016-06-02 NOTE — ED Provider Notes (Addendum)
[facility] DEPT Provider Note   CSN: [id number] Arrival date & time: 06/02/16  0258     History   Chief Complaint Chief Complaint  Patient presents with  . Leg Pain    HPI Hector Mahoney is a 45 y.o. male.  Pt presents to the ED today with muscle cramps.  He has had these for months.  He was admitted in September with acute on chronic renal failure and the muscle cramps.  He was taken off his lipitor in case that was the culprit, but his cramps did not go away.  The pt was given a rx for Requip in case the cramps were from restless leg syndrome, but he did not get the rx filled.  He said that he was told to wait on it in case stopping the lipitor would help, then he forgot about it.  Pt describes the cramps as occurring at night.  Today, he got up from work and his legs started cramping.  It did not go away, so he came in here.  He did not take any of his morning medications.      Past Medical History:  Diagnosis Date  . ABSCESS, TOOTH 04/23/2009  . AKI (acute kidney injury) ([place]) 08/2015  . CHF (congestive heart failure) ([place])   . DIABETES MELLITUS, TYPE I 07/30/2007  . Esophageal reflux 04/27/2009  . FATIGUE 04/27/2009  . HYPERLIPIDEMIA 04/24/2007  . HYPERTENSION 04/24/2007  . INSOMNIA-SLEEP DISORDER-UNSPEC 04/27/2009  . LUMBAR STRAIN, ACUTE 12/11/2008  . RASH-NONVESICULAR 03/11/2008  . Shortness of breath    "lying down and w/exertion right now" (12/22/2012)    Patient Active Problem List   Diagnosis Date Noted  . Allergic rhinitis 05/04/2016  . Nocturnal leg cramps 04/03/2016  . Costochondritis 12/02/2015  . Acute renal failure ([place]) 09/08/2015  . Hypertensive urgency 09/02/2015  . Chronic diastolic heart failure, NYHA class 3 ([place]) 09/02/2015  . Dehydration 09/02/2015  . Patient nonadherence 09/02/2015  . CKD stage 4 due to type 2 diabetes mellitus ([place]) 09/02/2015  . Acute sinus infection 08/24/2015  . Poorly controlled type 2 diabetes mellitus with  circulatory disorder ([place]) 12/22/2012  . Back pain 12/28/2011  . Preventative health care 04/01/2011  . Esophageal reflux 04/27/2009  . INSOMNIA-SLEEP DISORDER-UNSPEC 04/27/2009  . FATIGUE 04/27/2009  . RASH-NONVESICULAR 03/11/2008  . Hyperlipidemia 04/24/2007  . Essential hypertension 04/24/2007    Past Surgical History:  Procedure Laterality Date  . MOUTH SURGERY         Home Medications    Prior to Admission medications   Medication Sig Start Date End Date Taking? Authorizing Provider  amLODipine (NORVASC) 10 MG tablet Take 1 tablet (10 mg total) by mouth daily. 05/02/16  Yes Albertine Patricia, MD  aspirin EC 81 MG EC tablet Take 1 tablet (81 mg total) by mouth daily. 09/06/15  Yes Nita Sells, MD  carvedilol (COREG) 25 MG tablet Take 1 tablet (25 mg total) by mouth 2 (two) times daily. 09/06/15  Yes Nita Sells, MD  furosemide (LASIX) 40 MG tablet 1 tab by mouth every M-W-F, and other days as needed if wt increased 2 lbs 03/01/16  Yes Biagio Borg, MD  insulin aspart (NOVOLOG) 100 UNIT/ML injection Inject 8-12 Units into the skin 3 (three) times daily with meals. 04/25/16  Yes Philemon Kingdom, MD  insulin glargine (LANTUS) 100 UNIT/ML injection Inject 0.4 mLs (40 Units total) into the skin at bedtime. 04/25/16  Yes Philemon Kingdom, MD  Insulin Syringe-Needle U-100 (B-D INS  SYRINGE 0.5CC/31GX5/16) 31G X 5/16" 0.5 ML MISC Use 4x a day 04/25/16  Yes Philemon Kingdom, MD  Insulin Syringe-Needle U-100 (RELION INSULIN SYRINGE) 31G X 15/64" 0.5 ML MISC Use 4 times daily. 04/26/16  Yes Philemon Kingdom, MD  isosorbide mononitrate (IMDUR) 30 MG 24 hr tablet Take 0.5 tablets (15 mg total) by mouth daily. 11/23/15  Yes Biagio Borg, MD  cyclobenzaprine (FLEXERIL) 5 MG tablet Take 1 tablet (5 mg total) by mouth at bedtime. Patient not taking: Reported on 06/02/2016 04/03/16   Biagio Borg, MD  predniSONE (DELTASONE) 10 MG tablet 2 tabs by mouth per day for 5 days, Patient not  taking: Reported on 06/02/2016 05/04/16   Biagio Borg, MD  rOPINIRole (REQUIP) 0.25 MG tablet Take 1 tablet (0.25 mg total) by mouth at bedtime. 06/02/16   Isla Pence, MD    Family History Family History  Problem Relation Age of Onset  . Hypertension Mother     Social History Social History  Substance Use Topics  . Smoking status: Former Smoker    Types: Cigarettes    Quit date: 05/03/2015  . Smokeless tobacco: Never Used     Comment: 12/22/2012 "used to smoke cigarettes once or twice/month; haven't had any cigarettes for years"  . Alcohol use 1.2 oz/week    1 Cans of beer, 1 Shots of liquor per week     Comment: 12/22/2012 "once mixed or one beer 2 times/week"     Allergies   Review of patient's allergies indicates no known allergies.   Review of Systems Review of Systems  Musculoskeletal:       Muscle cramps  All other systems reviewed and are negative.    Physical Exam Updated Vital Signs BP (!) 178/108   Pulse 84   Temp 98.3 F (36.8 C) (Oral)   Resp 24   SpO2 100%   Physical Exam  Constitutional: He is oriented to person, place, and time. He appears well-developed and well-nourished.  HENT:  Head: Normocephalic and atraumatic.  Right Ear: External ear normal.  Left Ear: External ear normal.  Nose: Nose normal.  Mouth/Throat: Oropharynx is clear and moist.  Eyes: Conjunctivae and EOM are normal. Pupils are equal, round, and reactive to light.  Neck: Normal range of motion. Neck supple.  Cardiovascular: Normal rate, regular rhythm, normal heart sounds and intact distal pulses.   Pulmonary/Chest: Effort normal and breath sounds normal.  Abdominal: Soft. Bowel sounds are normal.  Musculoskeletal: Normal range of motion.  Neurological: He is alert and oriented to person, place, and time.  Skin: Skin is warm.  Psychiatric: He has a normal mood and affect. His behavior is normal. Judgment and thought content normal.  Nursing note and vitals  reviewed.    ED Treatments / Results  Labs (all labs ordered are listed, but only abnormal results are displayed) Labs Reviewed  COMPREHENSIVE METABOLIC PANEL - Abnormal; Notable for the following:       Result Value   Glucose, Bld 313 (*)    BUN 51 (*)    Creatinine, Ser 3.73 (*)    Calcium 8.8 (*)    Albumin 3.4 (*)    GFR calc non Af Amer 18 (*)    GFR calc Af Amer 21 (*)    All other components within normal limits  CBC WITH DIFFERENTIAL/PLATELET - Abnormal; Notable for the following:    Hemoglobin 11.6 (*)    HCT 36.1 (*)    MCV 76.8 (*)  MCH 24.7 (*)    All other components within normal limits  TROPONIN I  URINALYSIS, ROUTINE W REFLEX MICROSCOPIC (NOT AT [facility])    EKG  EKG Interpretation  Date/Time:  Saturday June 02 2016 07:27:04 EDT Ventricular Rate:  82 PR Interval:    QRS Duration: 87 QT Interval:  382 QTC Calculation: 447 R Axis:   -22 Text Interpretation:  Sinus rhythm Borderline prolonged PR interval Probable left atrial enlargement Probable LVH with secondary repol abnrm Baseline wander in lead(s) V2 V5 No significant change since last tracing Confirmed by [facility] MD, Peyten Punches ([id number]) on 06/02/2016 7:37:51 AM Also confirmed by [facility] MD, Dodd Schmid ([id number]), editor Rolla Plate, Joelene Millin [phone])  on 06/02/2016 8:47:10 AM       Radiology No results found.  Procedures Procedures (including critical care time)  Medications Ordered in ED Medications - No data to display   Initial Impression / Assessment and Plan / ED Course  I have reviewed the triage vital signs and the nursing notes.  Pertinent labs & imaging results that were available during my care of the patient were reviewed by me and considered in my medical decision making (see chart for details).  Clinical Course   Pt is feeling better.  Kidney function is stable.  His blood pressure and blood sugar are elevated.  I spoke to him about taking his medications to keep his bp and bs down.  The pt is  also given another rx for the Requip to see if that helps. Pt is going to try transdermal magnesium at night to see if that helps as well.   Pt encouraged to eat healthy vegetables and fruit.  Pt knows to return if worse and to f/u with pcp.  Final Clinical Impressions(s) / ED Diagnoses   Final diagnoses:  Nocturnal leg cramps  Chronic renal impairment, unspecified CKD stage  Poorly controlled type 2 diabetes mellitus [facility])  Essential hypertension    New Prescriptions Current Discharge Medication List       Isla Pence, MD 06/02/16 [id number]    Isla Pence, MD 06/02/16 [id number]    Isla Pence, MD 06/02/16 [phone]

## 2016-06-02 NOTE — ED Notes (Signed)
Pt given urinal for urine specimen. 

## 2016-06-06 ENCOUNTER — Ambulatory Visit: Payer: Federal, State, Local not specified - PPO | Admitting: Internal Medicine

## 2016-06-06 DIAGNOSIS — Z0289 Encounter for other administrative examinations: Secondary | ICD-10-CM

## 2016-08-14 ENCOUNTER — Encounter: Payer: Self-pay | Admitting: Internal Medicine

## 2016-08-14 ENCOUNTER — Ambulatory Visit (INDEPENDENT_AMBULATORY_CARE_PROVIDER_SITE_OTHER): Payer: Federal, State, Local not specified - PPO | Admitting: Internal Medicine

## 2016-08-14 VITALS — BP 138/80 | HR 103 | Temp 98.5°F | Resp 20 | Wt 216.0 lb

## 2016-08-14 DIAGNOSIS — J069 Acute upper respiratory infection, unspecified: Secondary | ICD-10-CM | POA: Diagnosis not present

## 2016-08-14 DIAGNOSIS — E1159 Type 2 diabetes mellitus with other circulatory complications: Secondary | ICD-10-CM | POA: Diagnosis not present

## 2016-08-14 DIAGNOSIS — E1165 Type 2 diabetes mellitus with hyperglycemia: Secondary | ICD-10-CM

## 2016-08-14 DIAGNOSIS — I10 Essential (primary) hypertension: Secondary | ICD-10-CM | POA: Diagnosis not present

## 2016-08-14 DIAGNOSIS — I5032 Chronic diastolic (congestive) heart failure: Secondary | ICD-10-CM | POA: Diagnosis not present

## 2016-08-14 MED ORDER — HYDROCODONE-HOMATROPINE 5-1.5 MG/5ML PO SYRP: 5.0000 mL | ORAL_SOLUTION | Freq: Four times a day (QID) | ORAL | 0 refills | Status: AC | PRN

## 2016-08-14 MED ORDER — LEVOFLOXACIN 500 MG PO TABS: 500.0000 mg | ORAL_TABLET | Freq: Every day | ORAL | 0 refills | Status: AC

## 2016-08-14 NOTE — Progress Notes (Signed)
Letter done

## 2016-08-14 NOTE — Progress Notes (Signed)
Pre visit review using our clinic review tool, if applicable. No additional management support is needed unless otherwise documented below in the visit note. 

## 2016-08-14 NOTE — Patient Instructions (Signed)
Please take all new medication as prescribed - the antibiotic, and cough medicine if needed  Please continue all other medications as before, and refills have been done if requested.  Please have the pharmacy call with any other refills you may need.  Please continue your efforts at being more active, low cholesterol diabetic diet, and weight control.  You will be contacted regarding the referral for: cardiology  Please keep your appointments with your specialists as you may have planned

## 2016-08-14 NOTE — Progress Notes (Signed)
Subjective:    Patient ID: Hector Mahoney, male    DOB: 09-10-1970, 46 y.o.   MRN: [id number]  HPI   Here with 2-3 days acute onset fever, facial pain, pressure, headache, general weakness and malaise, and greenish d/c, with mild ST and cough, but pt denies chest pain, wheezing, increased sob or doe, orthopnea, PND, increased LE swelling, palpitations, dizziness or syncope, but has been having "episodes" of more and more freq pulm edema symptoms, every 2 -3 wks now despite current tx.  Pt denies polydipsia, polyuria  Pt denies new neurological symptoms such as new headache, or facial or extremity weakness or numbness   No other new symptoms Past Medical History:  Diagnosis Date  . ABSCESS, TOOTH 04/23/2009  . AKI (acute kidney injury) ([place]) 08/2015  . CHF (congestive heart failure) ([place])   . DIABETES MELLITUS, TYPE I 07/30/2007  . Esophageal reflux 04/27/2009  . FATIGUE 04/27/2009  . HYPERLIPIDEMIA 04/24/2007  . HYPERTENSION 04/24/2007  . INSOMNIA-SLEEP DISORDER-UNSPEC 04/27/2009  . LUMBAR STRAIN, ACUTE 12/11/2008  . RASH-NONVESICULAR 03/11/2008  . Shortness of breath    "lying down and w/exertion right now" (12/22/2012)   Past Surgical History:  Procedure Laterality Date  . MOUTH SURGERY      reports that he quit smoking about 15 months ago. His smoking use included Cigarettes. He has never used smokeless tobacco. He reports that he drinks about 1.2 oz of alcohol per week . He reports that he does not use drugs. family history includes Hypertension in his mother. No Known Allergies Current Outpatient Prescriptions on File Prior to Visit  Medication Sig Dispense Refill  . amLODipine (NORVASC) 10 MG tablet Take 1 tablet (10 mg total) by mouth daily. 30 tablet 0  . aspirin EC 81 MG EC tablet Take 1 tablet (81 mg total) by mouth daily. 30 tablet 0  . carvedilol (COREG) 25 MG tablet Take 1 tablet (25 mg total) by mouth 2 (two) times daily. 60 tablet 11  . cyclobenzaprine (FLEXERIL) 5 MG  tablet Take 1 tablet (5 mg total) by mouth at bedtime. 90 tablet 1  . furosemide (LASIX) 40 MG tablet 1 tab by mouth every M-W-F, and other days as needed if wt increased 2 lbs 90 tablet 3  . insulin aspart (NOVOLOG) 100 UNIT/ML injection Inject 8-12 Units into the skin 3 (three) times daily with meals. 2 vial 5  . insulin glargine (LANTUS) 100 UNIT/ML injection Inject 0.4 mLs (40 Units total) into the skin at bedtime. 20 mL 5  . Insulin Syringe-Needle U-100 (B-D INS SYRINGE 0.5CC/31GX5/16) 31G X 5/16" 0.5 ML MISC Use 4x a day 300 each 5  . Insulin Syringe-Needle U-100 (RELION INSULIN SYRINGE) 31G X 15/64" 0.5 ML MISC Use 4 times daily. 300 each 5  . isosorbide mononitrate (IMDUR) 30 MG 24 hr tablet Take 0.5 tablets (15 mg total) by mouth daily. 90 tablet 1  . predniSONE (DELTASONE) 10 MG tablet 2 tabs by mouth per day for 5 days, 10 tablet 0  . rOPINIRole (REQUIP) 0.25 MG tablet Take 1 tablet (0.25 mg total) by mouth at bedtime. 30 tablet 0   No current facility-administered medications on file prior to visit.    Review of Systems  Constitutional: Negative for unusual diaphoresis or night sweats HENT: Negative for ear swelling or discharge Eyes: Negative for worsening visual haziness  Respiratory: Negative for choking and stridor.   Gastrointestinal: Negative for distension or worsening eructation Genitourinary: Negative for retention or change in urine  volume.  Musculoskeletal: Negative for other MSK pain or swelling Skin: Negative for color change and worsening wound Neurological: Negative for tremors and numbness other than noted  Psychiatric/Behavioral: Negative for decreased concentration or agitation other than above   All other system neg per pt    Objective:   Physical Exam BP 138/80   Pulse (!) 103   Temp 98.5 F (36.9 C) (Oral)   Resp 20   Wt 216 lb (98 kg)   SpO2 98%   BMI 33.83 kg/m  VS noted, mild ill Constitutional: Pt appears in no apparent distress HENT: Head:  NCAT.  Right Ear: External ear normal.  Left Ear: External ear normal.  Eyes: . Pupils are equal, round, and reactive to light. Conjunctivae and EOM are normal Bilat tm's with mild erythema.  Max sinus areas mild tender.  Pharynx with mild erythema, no exudate Neck: Normal range of motion. Neck supple.  Cardiovascular: Normal rate and regular rhythm.   Pulmonary/Chest: Effort normal and breath sounds decreased without rales or wheezing.  Neurological: Pt is alert. Not confused , motor grossly intact Skin: Skin is warm. No rash, no LE edema Psychiatric: Pt behavior is normal. No agitation.  No other new exam findings    Assessment & Plan:

## 2016-08-19 NOTE — Assessment & Plan Note (Signed)
Uncontrolled recently, now more stable overall by history and exam, recent data reviewed with pt, and pt to continue medical treatment as before,  to f/u any worsening symptoms or concerns, declnies f/u a1c Lab Results  Component Value Date   HGBA1C 10.9 04/25/2016   To cont endo f/u

## 2016-08-19 NOTE — Assessment & Plan Note (Signed)
Mild to mod, for antibx course,  to f/u any worsening symptoms or concerns 

## 2016-08-19 NOTE — Assessment & Plan Note (Signed)
With more freq symptomatic exacerbations recently, pt requests card referral

## 2016-08-19 NOTE — Assessment & Plan Note (Signed)
stable overall by history and exam, recent data reviewed with pt, and pt to continue medical treatment as before,  to f/u any worsening symptoms or concerns BP Readings from Last 3 Encounters:  08/14/16 138/80  06/02/16 (!) 178/108  05/04/16 124/78

## 2016-09-14 ENCOUNTER — Ambulatory Visit: Payer: Federal, State, Local not specified - PPO | Admitting: Cardiovascular Disease

## 2016-09-14 ENCOUNTER — Encounter: Payer: Self-pay | Admitting: *Deleted

## 2016-09-27 ENCOUNTER — Encounter: Payer: Self-pay | Admitting: Cardiovascular Disease

## 2016-09-27 ENCOUNTER — Ambulatory Visit (INDEPENDENT_AMBULATORY_CARE_PROVIDER_SITE_OTHER): Payer: Federal, State, Local not specified - PPO | Admitting: Cardiovascular Disease

## 2016-09-27 VITALS — BP 192/120 | HR 100 | Ht 66.0 in | Wt 210.0 lb

## 2016-09-27 DIAGNOSIS — I5032 Chronic diastolic (congestive) heart failure: Secondary | ICD-10-CM

## 2016-09-27 DIAGNOSIS — I160 Hypertensive urgency: Secondary | ICD-10-CM | POA: Diagnosis not present

## 2016-09-27 DIAGNOSIS — E7800 Pure hypercholesterolemia, unspecified: Secondary | ICD-10-CM | POA: Diagnosis not present

## 2016-09-27 NOTE — Progress Notes (Signed)
Cardiology Office Note   Date:  09/27/2016   ID:  Hector, Mahoney [birth year], MRN [id number]  PCP:  Cathlean Cower, MD  Cardiologist:   Skeet Latch, MD   Chief Complaint  Patient presents with  . New Patient (Initial Visit)  . Shortness of Breath    occasionally.  . Leg Pain    cramping in legs.  . Edema    fingers and feet.      History of Present Illness: Hector Mahoney is a 46 y.o. male with hypertension, hyperlipidemia, DM (A1c 11.4 01/02/13) here with acute on chronic diastolic heart failure.  Hector Mahoney was diagnosed with grade 3 diastolic dysfunction in 2263.  Since that time he has been seen in the ED several times for heart failure exacerbations.  Lately he reports that his BP has been well-controlled.  However he hasn't taken his medication yet today.  It is typically in the 140s/80.  He weighs himself Regularly and his weight has ranged from 209-215.  He hasn't noted lower extremity edema but often feels like there is fluid around his heart.  He endorses intermittent orthopnea and PND.  He is scheduled to take Lasix Monday Wednesday and Friday. However he only takes it as needed because he feels that he gets dehydrated he does take it. On average she is taking it once every 2 weeks. He reports severe cramping in bilateral legs that happens approximately once per week. Since reducing his Lasix he hasn't noted any change in his symptoms.  He doesn't exercise regularly.  He tries to minimize his salt intake.  He doesn't cook with any salt.  He struggles with eating out.     Past Medical History:  Diagnosis Date  . ABSCESS, TOOTH 04/23/2009  . AKI (acute kidney injury) ([facility]) 08/2015  . CHF (congestive heart failure) ([facility])   . DIABETES MELLITUS, TYPE I 07/30/2007  . Esophageal reflux 04/27/2009  . FATIGUE 04/27/2009  . HYPERLIPIDEMIA 04/24/2007  . HYPERTENSION 04/24/2007  . INSOMNIA-SLEEP DISORDER-UNSPEC 04/27/2009  . LUMBAR STRAIN, ACUTE 12/11/2008  .  RASH-NONVESICULAR 03/11/2008  . Shortness of breath    "lying down and w/exertion right now" (12/22/2012)    Past Surgical History:  Procedure Laterality Date  . MOUTH SURGERY       Current Outpatient Prescriptions  Medication Sig Dispense Refill  . amLODipine (NORVASC) 10 MG tablet Take 1 tablet (10 mg total) by mouth daily. 30 tablet 0  . aspirin EC 81 MG EC tablet Take 1 tablet (81 mg total) by mouth daily. 30 tablet 0  . carvedilol (COREG) 25 MG tablet Take 1 tablet (25 mg total) by mouth 2 (two) times daily. 60 tablet 11  . cyclobenzaprine (FLEXERIL) 5 MG tablet Take 1 tablet (5 mg total) by mouth at bedtime. 90 tablet 1  . furosemide (LASIX) 40 MG tablet Take 40 mg by mouth as directed. 1/2 TABLET BY MOUTH ON Monday, Wednesday, AND Friday ONLY    . insulin aspart (NOVOLOG) 100 UNIT/ML injection Inject 8-12 Units into the skin 3 (three) times daily with meals. 2 vial 5  . insulin glargine (LANTUS) 100 UNIT/ML injection Inject 0.4 mLs (40 Units total) into the skin at bedtime. 20 mL 5  . Insulin Syringe-Needle U-100 (B-D INS SYRINGE 0.5CC/31GX5/16) 31G X 5/16" 0.5 ML MISC Use 4x a day 300 each 5  . Insulin Syringe-Needle U-100 (RELION INSULIN SYRINGE) 31G X 15/64" 0.5 ML MISC Use 4 times daily. 300 each 5  .  isosorbide mononitrate (IMDUR) 30 MG 24 hr tablet Take 0.5 tablets (15 mg total) by mouth daily. 90 tablet 1  . predniSONE (DELTASONE) 10 MG tablet 2 tabs by mouth per day for 5 days, 10 tablet 0  . rOPINIRole (REQUIP) 0.25 MG tablet Take 1 tablet (0.25 mg total) by mouth at bedtime. 30 tablet 0   No current facility-administered medications for this visit.     Allergies:   Patient has no known allergies.    Social History:  The patient  reports that he quit smoking about 16 months ago. His smoking use included Cigarettes. He has never used smokeless tobacco. He reports that he drinks about 1.2 oz of alcohol per week . He reports that he does not use drugs.   Family History:   The patient's family history includes Hypertension in his mother.    ROS:  Please see the history of present illness.   Otherwise, review of systems are positive for none.   All other systems are reviewed and negative.    PHYSICAL EXAM: VS:  BP (!) 192/120   Pulse 100   Ht 5\' 6"  (1.676 m)   Wt 95.3 kg (210 lb)   BMI 33.89 kg/m  , BMI Body mass index is 33.89 kg/m. GENERAL:  Well appearing HEENT:  Pupils equal round and reactive, fundi not visualized, oral mucosa unremarkable NECK:  No JVD.  Waveform within normal limits, carotid upstroke brisk and symmetric, no bruits, no thyromegaly LYMPHATICS:  No cervical adenopathy LUNGS:  Clear to auscultation bilaterally HEART:  RRR.  PMI not displaced or sustained,S1 and S2 within normal limits, no S3, no S4, no clicks, no rubs, no murmurs ABD:  Flat, positive bowel sounds normal in frequency in pitch, no bruits, no rebound, no guarding, no midline pulsatile mass, no hepatomegaly, no splenomegaly EXT:  2 plus pulses throughout, no edema, no cyanosis no clubbing SKIN:  No rashes no nodules NEURO:  Cranial nerves II through XII grossly intact, motor grossly intact throughout PSYCH:  Cognitively intact, oriented to person place and time    EKG:  EKG is ordered today. 04/13/15: Sinus rhythm at 94 bpm.  L axis deviation.  LVH with early repolarization. 09/27/16: Sinus rhythm. Rate 100 bpm. LAFB. LVH with repolarization laterality.  TTE 12/22/12: LVEF 50-55%, moderate LVH.  Mild anterior, lateral, inferior and apical hypokinesis.  Grade 3 diastolic dysfunction.    Echo 08/15/15: Study Conclusions  - Left ventricle: The cavity size was normal. Wall thickness was   normal. Systolic function was normal. The estimated ejection   fraction was in the range of 50% to 55%. Features are consistent   with a pseudonormal left ventricular filling pattern, with   concomitant abnormal relaxation and increased filling pressure   (grade 2 diastolic  dysfunction).  Impressions:  - Abnormal global LV longitudinal strain -11.1%    Recent Labs: 05/01/2016: Magnesium 1.9 06/02/2016: ALT 25; BUN 51; Creatinine, Ser 3.73; Hemoglobin 11.6; Platelets 229; Potassium 4.9; Sodium 136    Lipid Panel    Component Value Date/Time   CHOL 293 (H) 01/02/2013 1627   TRIG 316.0 (H) 01/02/2013 1627   HDL 59.60 01/02/2013 1627   CHOLHDL 5 01/02/2013 1627   VLDL 63.2 (H) 01/02/2013 1627   LDLDIRECT 176.4 01/02/2013 1627      Wt Readings from Last 3 Encounters:  09/27/16 95.3 kg (210 lb)  08/14/16 98 kg (216 lb)  05/04/16 94.3 kg (208 lb)      Other studies Reviewed: Additional  studies/ records that were reviewed today include: . Review of the above records demonstrates:  Please see elsewhere in the note.     ASSESSMENT AND PLAN:  # Chronic diastolic heart failure: Mr. Rhine appears to report episodes of orthopnea and dyspnea. However he has been hesitant to take Lasix due to leg cramping. We will reduce his Lasix to 20 mg and he will take it every Monday Wednesday Friday. He also has been prescribed Requip for restless leg syndrome but has not yet tried medication. I encouraged him to take this, the fact that he continues to have leg cramping after stopping Lasix makes it more likely that this is the cause of his symptoms.  We also discussed the importance of taking his medications regularly. He sometimes forgets to take them in the morning or doesn't end up taking them until lunchtime. I recommended that he start taking his once daily medications at night instead   # Hypertension: BP poorly-controlled.  He has not taken his BP meds yet today, but reports poorly-controlled BP even when he takes them.  He will go to his car and take carvedilol and amlodipine at this time. Continue these medications as well as Imdur.  # Hyperlipidemia: He is no longer taking atorvastatin and does not know why. He is due to have lipids rechecked. I offered  to check them today but he was referred to have the chest at the [facility] next month.   # CV disease prevention: Continue aspirin 81mg  daily.   Current medicines are reviewed at length with the patient today.  The patient has concerns regarding medicines.  The following changes have been made:  Start carvedilol, stop metoprolol  Labs/ tests ordered today include:   No orders of the defined types were placed in this encounter.    Disposition:   FU with Dr. Jonelle Sidle C. Davey in 2 months.    Signed, Skeet Latch, MD  09/27/2016 2:27 PM    [place]

## 2016-09-27 NOTE — Patient Instructions (Addendum)
Medication Instructions:  DECREASE YOUR FUROSEMIDE TO 20 MG Monday, Wednesday, AND Friday ONLY OK TO TAKE 1/2 OF THE 40 MG TABLETS UNTIL FOLLOW UP   Labwork: NONE  Testing/Procedures: NONE  Follow-Up: Your physician recommends that you schedule a follow-up appointment in: 2 MONTH OV  Any Other Special Instructions Will Be Listed Below (If Applicable). TAKE YOUR BLOOD PRESSURE MEDICINE NOW  TAKE ALL YOUR BLOOD PRESSURE MEDICATIONS IN THE EVENING  LOG YOUR BLOOD PRESSURES AND BRING TO YOUR FOLLOW UP APPOINTMENT  If you need a refill on your cardiac medications before your next appointment, please call your pharmacy.

## 2016-10-12 ENCOUNTER — Other Ambulatory Visit: Payer: Self-pay | Admitting: Internal Medicine

## 2016-10-15 DIAGNOSIS — D631 Anemia in chronic kidney disease: Secondary | ICD-10-CM | POA: Diagnosis not present

## 2016-10-15 DIAGNOSIS — N189 Chronic kidney disease, unspecified: Secondary | ICD-10-CM | POA: Diagnosis not present

## 2016-10-15 DIAGNOSIS — I129 Hypertensive chronic kidney disease with stage 1 through stage 4 chronic kidney disease, or unspecified chronic kidney disease: Secondary | ICD-10-CM | POA: Diagnosis not present

## 2016-10-15 DIAGNOSIS — N183 Chronic kidney disease, stage 3 (moderate): Secondary | ICD-10-CM | POA: Diagnosis not present

## 2016-10-15 DIAGNOSIS — N184 Chronic kidney disease, stage 4 (severe): Secondary | ICD-10-CM | POA: Diagnosis not present

## 2016-10-15 DIAGNOSIS — E785 Hyperlipidemia, unspecified: Secondary | ICD-10-CM | POA: Diagnosis not present

## 2016-10-15 DIAGNOSIS — N2581 Secondary hyperparathyroidism of renal origin: Secondary | ICD-10-CM | POA: Diagnosis not present

## 2016-10-31 ENCOUNTER — Telehealth: Payer: Self-pay | Admitting: Internal Medicine

## 2016-10-31 MED ORDER — ROPINIROLE HCL 0.25 MG PO TABS: 0.2500 mg | ORAL_TABLET | Freq: Every day | ORAL | 0 refills | Status: DC

## 2016-10-31 NOTE — Telephone Encounter (Signed)
Called LVM to informed patient about rx and to call back and make an appointment. Thank you.

## 2016-10-31 NOTE — Telephone Encounter (Signed)
I will send in a 30 day supple but be needs to schedule an hosp f/u in the next 30 days to continue getting medication.

## 2016-10-31 NOTE — Telephone Encounter (Signed)
rOPINIRole (REQUIP) 0.25 MG tablet [[id number]]   Patient is requesting a refill on this medication. He state he got this medication while in the hospital. He did not do a hospital follow up. I informed him he may need to do that before getting this refill. He wanted me to just ask first. Please advise. Thank you.   [facility] on [address]

## 2016-11-05 DIAGNOSIS — E785 Hyperlipidemia, unspecified: Secondary | ICD-10-CM | POA: Diagnosis not present

## 2016-11-05 DIAGNOSIS — N184 Chronic kidney disease, stage 4 (severe): Secondary | ICD-10-CM | POA: Diagnosis not present

## 2016-11-05 DIAGNOSIS — E1122 Type 2 diabetes mellitus with diabetic chronic kidney disease: Secondary | ICD-10-CM | POA: Diagnosis not present

## 2016-11-05 DIAGNOSIS — I129 Hypertensive chronic kidney disease with stage 1 through stage 4 chronic kidney disease, or unspecified chronic kidney disease: Secondary | ICD-10-CM | POA: Diagnosis not present

## 2016-11-21 ENCOUNTER — Other Ambulatory Visit: Payer: Self-pay | Admitting: Internal Medicine

## 2016-11-26 ENCOUNTER — Ambulatory Visit (INDEPENDENT_AMBULATORY_CARE_PROVIDER_SITE_OTHER): Payer: Federal, State, Local not specified - PPO | Admitting: Cardiovascular Disease

## 2016-11-26 ENCOUNTER — Encounter: Payer: Self-pay | Admitting: Cardiovascular Disease

## 2016-11-26 VITALS — BP 152/84 | HR 85 | Ht 66.0 in | Wt 213.2 lb

## 2016-11-26 DIAGNOSIS — I110 Hypertensive heart disease with heart failure: Secondary | ICD-10-CM

## 2016-11-26 DIAGNOSIS — G2581 Restless legs syndrome: Secondary | ICD-10-CM

## 2016-11-26 DIAGNOSIS — E7800 Pure hypercholesterolemia, unspecified: Secondary | ICD-10-CM | POA: Diagnosis not present

## 2016-11-26 DIAGNOSIS — Z9119 Patient's noncompliance with other medical treatment and regimen: Secondary | ICD-10-CM | POA: Diagnosis not present

## 2016-11-26 DIAGNOSIS — I5032 Chronic diastolic (congestive) heart failure: Secondary | ICD-10-CM

## 2016-11-26 DIAGNOSIS — Z91199 Patient's noncompliance with other medical treatment and regimen due to unspecified reason: Secondary | ICD-10-CM

## 2016-11-26 MED ORDER — ROSUVASTATIN CALCIUM 20 MG PO TABS: 20.0000 mg | ORAL_TABLET | Freq: Every day | ORAL | 5 refills | Status: DC

## 2016-11-26 NOTE — Progress Notes (Signed)
Cardiology Office Note   Date:  11/26/2016   ID:  Hector Mahoney 1971/08/03, MRN [id number]  PCP:  Cathlean Cower, MD  Cardiologist:   Skeet Latch, MD   Chief Complaint  Patient presents with  . Follow-up    pt reports no complaints      History of Present Illness: Hector Mahoney is a 46 y.o. male with chronic diastolic heart failure, hypertension, hyperlipidemia, DM (A1c 11.4% 01/02/13) here for follow up.  [name] was initially seen 09/2016 with acute on chronic diastolic heart failure.  [name] was diagnosed with grade 3 diastolic dysfunction in 3734.  Since that time he has been seen in the ED several times for heart failure exacerbations.  Lately he reports that his BP has been well-controlled.  However at that appointment his blood pressure was elevated, as he had not yet taken his medication that day. He also reported lower extremity edema but had not been taking Lasix because he felt to be dehydrated him. At his last appointment the dose was reduced to 20 mg and he was asked to start taking it Monday, Wednesday, and Friday.  Since his last appointment [name] has noted intermittent episodes of shortness of breath.  He occasionally feels "fluid around his heart" that he attributes to eating salty food.  He doesn't get lower extremity edema but feels exertional shortness of breath and orthopnea when laying down.  When this happens he takes an extra dose of lasix, which helps.  This typically occurs after eating pork or after eating out at restaurants.  His BP at home has been in the 150s/80s. At times it has been as low as 100/60 and the highest is 195/100.  He saw his PCP at the [place] and amlodipine was discontinued due low blood pressures.  Since then his blood pressure has been mostly in the 150s. His cholesterol was checked and was reportedly elevated. He saw his nephrologist and was started on ezetimibe. He reports being on simvastatin in the past and is  unsure why this was stopped. He denies any statin intolerance  [name] hasn't been exercising much lately   He hopes to start back exercising more regularly.  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    Past Medical History:  Diagnosis Date  . ABSCESS, TOOTH 04/23/2009  . AKI (acute kidney injury) ([facility]) 08/2015  . CHF (congestive heart failure) ([facility])   . DIABETES MELLITUS, TYPE I 07/30/2007  . Esophageal reflux 04/27/2009  . FATIGUE 04/27/2009  . HYPERLIPIDEMIA 04/24/2007  . HYPERTENSION 04/24/2007  . INSOMNIA-SLEEP DISORDER-UNSPEC 04/27/2009  . LUMBAR STRAIN, ACUTE 12/11/2008  . RASH-NONVESICULAR 03/11/2008  . Shortness of breath    "lying down and w/exertion right now" (12/22/2012)    Past Surgical History:  Procedure Laterality Date  . MOUTH SURGERY       Current Outpatient Prescriptions  Medication Sig Dispense Refill  . amLODipine (NORVASC) 2.5 MG tablet Take 2.5 mg by mouth daily.    Marland Kitchen aspirin EC 81 MG EC tablet Take 1 tablet (81 mg total) by mouth daily. 30 tablet 0  . carvedilol (COREG) 25 MG tablet Take 1 tablet (25 mg total) by mouth 2 (two) times daily. 60 tablet 11  . cyclobenzaprine (FLEXERIL) 5 MG tablet Take 1 tablet (5 mg total) by mouth at bedtime. 90 tablet 1  . furosemide (LASIX) 40 MG tablet Take 40 mg by mouth as directed.  1/2 TABLET BY MOUTH ON Monday, Wednesday, AND Friday ONLY    . insulin aspart (NOVOLOG) 100 UNIT/ML injection Inject 8-12 Units into the skin 3 (three) times daily with meals. 2 vial 5  . insulin glargine (LANTUS) 100 UNIT/ML injection Inject 0.4 mLs (40 Units total) into the skin at bedtime. 20 mL 5  . Insulin Syringe-Needle U-100 (B-D INS SYRINGE 0.5CC/31GX5/16) 31G X 5/16" 0.5 ML MISC Use 4x a day 300 each 5  . Insulin Syringe-Needle U-100 (RELION INSULIN SYRINGE) 31G X 15/64" 0.5 ML MISC Use 4 times daily. 300 each 5  . isosorbide mononitrate (IMDUR) 30 MG 24 hr tablet TAKE ONE-HALF TABLET BY MOUTH ONCE  DAILY 90 tablet 1  . predniSONE (DELTASONE) 10 MG tablet 2 tabs by mouth per day for 5 days, 10 tablet 0  . rOPINIRole (REQUIP) 0.25 MG tablet TAKE 1 TABLET BY MOUTH AT BEDTIME 90 tablet 3  . rosuvastatin (CRESTOR) 20 MG tablet Take 1 tablet (20 mg total) by mouth daily. 30 tablet 5   No current facility-administered medications for this visit.     Allergies:   Patient has no known allergies.    Social History:  The patient  reports that he quit smoking about 18 months ago. His smoking use included Cigarettes. He has never used smokeless tobacco. He reports that he drinks about 1.2 oz of alcohol per week . He reports that he does not use drugs.   Family History:  The patient's family history includes Hypertension in his mother.    ROS:  Please see the history of present illness.   Otherwise, review of systems are positive for none.   All other systems are reviewed and negative.    PHYSICAL EXAM: VS:  BP (!) 152/84   Pulse 85   Ht 5\' 6"  (1.676 m)   Wt 96.7 kg (213 lb 3.2 oz)   BMI 34.41 kg/m  , BMI Body mass index is 34.41 kg/m. GENERAL:  Well appearing.  No acute distress. HEENT:  Pupils equal round and reactive, fundi not visualized, oral mucosa unremarkable NECK:  No JVD.  Waveform within normal limits, carotid upstroke brisk and symmetric, no bruits LYMPHATICS:  No cervical adenopathy LUNGS:  Clear to auscultation bilaterally HEART:  RRR.  PMI not displaced or sustained,S1 and S2 within normal limits, no S3, no S4, no clicks, no rubs, no murmurs ABD:  Flat, positive bowel sounds normal in frequency in pitch, no bruits, no rebound, no guarding, no midline pulsatile mass, no hepatomegaly, no splenomegaly EXT:  2 plus pulses throughout, no edema, no cyanosis no clubbing SKIN:  No rashes no nodules NEURO:  Cranial nerves II through XII grossly intact, motor grossly intact throughout PSYCH:  Cognitively intact, oriented to person place and time   EKG:  EKG is not ordered  today. 04/13/15: Sinus rhythm at 94 bpm.  L axis deviation.  LVH with early repolarization. 09/27/16: Sinus rhythm. Rate 100 bpm. LAFB. LVH with repolarization laterality.  TTE 12/22/12: LVEF 50-55%, moderate LVH.  Mild anterior, lateral, inferior and apical hypokinesis.  Grade 3 diastolic dysfunction.    Echo 08/15/15: Study Conclusions  - Left ventricle: The cavity size was normal. Wall thickness was   normal. Systolic function was normal. The estimated ejection   fraction was in the range of 50% to 55%. Features are consistent   with a pseudonormal left ventricular filling pattern, with   concomitant abnormal relaxation and increased filling pressure   (grade 2 diastolic dysfunction).  Impressions:  -  Abnormal global LV longitudinal strain -11.1%    Recent Labs: 05/01/2016: Magnesium 1.9 06/02/2016: ALT 25; BUN 51; Creatinine, Ser 3.73; Hemoglobin 11.6; Platelets 229; Potassium 4.9; Sodium 136    Lipid Panel    Component Value Date/Time   CHOL 293 (H) 01/02/2013 1627   TRIG 316.0 (H) 01/02/2013 1627   HDL 59.60 01/02/2013 1627   CHOLHDL 5 01/02/2013 1627   VLDL 63.2 (H) 01/02/2013 1627   LDLDIRECT 176.4 01/02/2013 1627      Wt Readings from Last 3 Encounters:  11/26/16 96.7 kg (213 lb 3.2 oz)  09/27/16 95.3 kg (210 lb)  08/14/16 98 kg (216 lb)      Other studies Reviewed: Additional studies/ records that were reviewed today include: . Review of the above records demonstrates:  Please see elsewhere in the note.     ASSESSMENT AND PLAN:  # Hypertension: BP poorly-controlled. Amlodipine was discontinued due to low blood pressures.  Now it is elevated again.  We will start amlodipine 2.5 mg daily instead of 10 mg.  Continue carvedilol and Imdur.  I have asked him to keep a log of his blood pressures.   # Chronic diastolic heart failure: Mr. Lall is euvolemic.  He has a good response to extra lasix as needed.  Blood pressure is poorly-controlled.  We will add  amlodipine as above.  # Hyperlipidemia: He is no longer taking atorvastatin and does not know why. He was started on ezetimibe by nephrology.  Would prefer that he be on a statin given his diabetes.  We will stop Zetia and start rosuvastatin 20 mg daily.  Check fasting lipids and CMP in 6 weeks.  # CV disease prevention: Continue aspirin 81mg  daily and starting rosuvastatin.  # Restless legs: Refill ropinirole.  Current medicines are reviewed at length with the patient today.  The patient has concerns regarding medicines.  The following changes have been made:  Restart amlodipine 2.5mg  daily.  Stop ezetimibe and start rosuvastatin 20 mg daily.   Labs/ tests ordered today include:   No orders of the defined types were placed in this encounter.    Disposition:   FU with Dr. Jonelle Sidle C. [place] in 6-8 weeks   Signed, Skeet Latch, MD  11/26/2016 10:37 PM    [place]

## 2016-11-26 NOTE — Patient Instructions (Addendum)
Medication Instructions:  STOP ZETIA   START ROSUVASTATIN 20 MG DAILY   START AMLODIPINE 2.5 MG DAILY IF YOU CAN NOT CUT YOUR CURRENT TABLETS CALL AND WILL SEND IN A NEW RX FOR THE 2.5 MG TABLETS  Labwork: NONE  Testing/Procedures: NONE  Follow-Up: Your physician recommends that you schedule a follow-up appointment in: 6-8 WEEKS  If you need a refill on your cardiac medications before your next appointment, please call your pharmacy.

## 2016-12-19 DIAGNOSIS — N5201 Erectile dysfunction due to arterial insufficiency: Secondary | ICD-10-CM | POA: Diagnosis not present

## 2016-12-19 DIAGNOSIS — N500 Atrophy of testis: Secondary | ICD-10-CM | POA: Diagnosis not present

## 2016-12-19 DIAGNOSIS — E291 Testicular hypofunction: Secondary | ICD-10-CM | POA: Diagnosis not present

## 2017-01-09 ENCOUNTER — Ambulatory Visit: Payer: Federal, State, Local not specified - PPO | Admitting: Cardiovascular Disease

## 2017-01-11 DIAGNOSIS — N184 Chronic kidney disease, stage 4 (severe): Secondary | ICD-10-CM | POA: Diagnosis not present

## 2017-01-11 DIAGNOSIS — E785 Hyperlipidemia, unspecified: Secondary | ICD-10-CM | POA: Diagnosis not present

## 2017-01-11 DIAGNOSIS — E1129 Type 2 diabetes mellitus with other diabetic kidney complication: Secondary | ICD-10-CM | POA: Diagnosis not present

## 2017-01-11 DIAGNOSIS — I129 Hypertensive chronic kidney disease with stage 1 through stage 4 chronic kidney disease, or unspecified chronic kidney disease: Secondary | ICD-10-CM | POA: Diagnosis not present

## 2017-02-04 DIAGNOSIS — I129 Hypertensive chronic kidney disease with stage 1 through stage 4 chronic kidney disease, or unspecified chronic kidney disease: Secondary | ICD-10-CM | POA: Diagnosis not present

## 2017-02-04 DIAGNOSIS — E1122 Type 2 diabetes mellitus with diabetic chronic kidney disease: Secondary | ICD-10-CM | POA: Diagnosis not present

## 2017-02-04 DIAGNOSIS — N184 Chronic kidney disease, stage 4 (severe): Secondary | ICD-10-CM | POA: Diagnosis not present

## 2017-02-04 DIAGNOSIS — E119 Type 2 diabetes mellitus without complications: Secondary | ICD-10-CM | POA: Diagnosis not present

## 2017-02-23 DIAGNOSIS — E103413 Type 1 diabetes mellitus with severe nonproliferative diabetic retinopathy with macular edema, bilateral: Secondary | ICD-10-CM | POA: Diagnosis not present

## 2017-02-23 LAB — HM DIABETES EYE EXAM

## 2017-03-20 DIAGNOSIS — E291 Testicular hypofunction: Secondary | ICD-10-CM | POA: Diagnosis not present

## 2017-03-27 DIAGNOSIS — E291 Testicular hypofunction: Secondary | ICD-10-CM | POA: Diagnosis not present

## 2017-03-29 ENCOUNTER — Encounter: Payer: Self-pay | Admitting: Internal Medicine

## 2017-03-29 ENCOUNTER — Ambulatory Visit (INDEPENDENT_AMBULATORY_CARE_PROVIDER_SITE_OTHER): Payer: Federal, State, Local not specified - PPO | Admitting: Internal Medicine

## 2017-03-29 VITALS — BP 154/98 | HR 89 | Temp 98.6°F | Ht 66.0 in | Wt 219.0 lb

## 2017-03-29 DIAGNOSIS — E1159 Type 2 diabetes mellitus with other circulatory complications: Secondary | ICD-10-CM | POA: Diagnosis not present

## 2017-03-29 DIAGNOSIS — M545 Low back pain, unspecified: Secondary | ICD-10-CM

## 2017-03-29 DIAGNOSIS — E1165 Type 2 diabetes mellitus with hyperglycemia: Secondary | ICD-10-CM | POA: Diagnosis not present

## 2017-03-29 DIAGNOSIS — I10 Essential (primary) hypertension: Secondary | ICD-10-CM | POA: Diagnosis not present

## 2017-03-29 MED ORDER — CYCLOBENZAPRINE HCL 5 MG PO TABS: 5.0000 mg | ORAL_TABLET | Freq: Every day | ORAL | 1 refills | Status: DC

## 2017-03-29 MED ORDER — TRAMADOL HCL 50 MG PO TABS: 50.0000 mg | ORAL_TABLET | Freq: Three times a day (TID) | ORAL | 0 refills | Status: DC | PRN

## 2017-03-29 NOTE — Progress Notes (Signed)
Subjective:    Patient ID: Hector Mahoney, male    DOB: 18-Jul-1971, 46 y.o.   MRN: [id number]  HPI  Here with midline low back pain acute, started yest am with lifting heavy box immediately, seemed more mild to start but a few hours later was severe, had to go home with heating pad use. Pt denies bowel or bladder change, fever, wt loss,  worsening LE pain/numbness/weakness, gait change or falls.  Denies urinary symptoms such as dysuria, frequency, urgency, flank pain, hematuria or n/v, fever, chills.Denies worsening reflux, abd pain, dysphagia, n/v, bowel change or blood, though had some mild constipation 2 days ago and resolved with dulcolox.   Pt denies fever, wt loss, night sweats, loss of appetite, or other constitutional symptoms  Nofalls. No significant prior hx of lower back pain. No imaging or MRI or PT or ortho Also mentions sugars now much better; admits he simply was not taking his evening dose insulin, but now is doing this after seeing Endo last mo at the [place].  Pt denies polydipsia, polyuria  Past Medical History:  Diagnosis Date  . ABSCESS, TOOTH 04/23/2009  . AKI (acute kidney injury) ([place]) 08/2015  . CHF (congestive heart failure) ([place])   . DIABETES MELLITUS, TYPE I 07/30/2007  . Esophageal reflux 04/27/2009  . FATIGUE 04/27/2009  . HYPERLIPIDEMIA 04/24/2007  . HYPERTENSION 04/24/2007  . INSOMNIA-SLEEP DISORDER-UNSPEC 04/27/2009  . LUMBAR STRAIN, ACUTE 12/11/2008  . RASH-NONVESICULAR 03/11/2008  . Shortness of breath    "lying down and w/exertion right now" (12/22/2012)   Past Surgical History:  Procedure Laterality Date  . MOUTH SURGERY      reports that he quit smoking about 22 months ago. His smoking use included Cigarettes. He has never used smokeless tobacco. He reports that he drinks about 1.2 oz of alcohol per week . He reports that he does not use drugs. family history includes Hypertension in his mother. No Known Allergies Current Outpatient Prescriptions on File Prior  to Visit  Medication Sig Dispense Refill  . amLODipine (NORVASC) 2.5 MG tablet Take 2.5 mg by mouth daily.    Marland Kitchen aspirin EC 81 MG EC tablet Take 1 tablet (81 mg total) by mouth daily. 30 tablet 0  . carvedilol (COREG) 25 MG tablet Take 1 tablet (25 mg total) by mouth 2 (two) times daily. 60 tablet 11  . cyclobenzaprine (FLEXERIL) 5 MG tablet Take 1 tablet (5 mg total) by mouth at bedtime. 90 tablet 1  . furosemide (LASIX) 40 MG tablet Take 40 mg by mouth as directed. 1/2 TABLET BY MOUTH ON Monday, Wednesday, AND Friday ONLY    . insulin aspart (NOVOLOG) 100 UNIT/ML injection Inject 8-12 Units into the skin 3 (three) times daily with meals. 2 vial 5  . insulin glargine (LANTUS) 100 UNIT/ML injection Inject 0.4 mLs (40 Units total) into the skin at bedtime. 20 mL 5  . Insulin Syringe-Needle U-100 (B-D INS SYRINGE 0.5CC/31GX5/16) 31G X 5/16" 0.5 ML MISC Use 4x a day 300 each 5  . Insulin Syringe-Needle U-100 (RELION INSULIN SYRINGE) 31G X 15/64" 0.5 ML MISC Use 4 times daily. 300 each 5  . isosorbide mononitrate (IMDUR) 30 MG 24 hr tablet TAKE ONE-HALF TABLET BY MOUTH ONCE DAILY 90 tablet 1  . rOPINIRole (REQUIP) 0.25 MG tablet TAKE 1 TABLET BY MOUTH AT BEDTIME 90 tablet 3  . rosuvastatin (CRESTOR) 20 MG tablet Take 1 tablet (20 mg total) by mouth daily. 30 tablet 5   No current facility-administered  medications on file prior to visit.    Review of Systems  Constitutional: Negative for other unusual diaphoresis or sweats HENT: Negative for ear discharge or swelling Eyes: Negative for other worsening visual disturbances Respiratory: Negative for stridor or other swelling  Gastrointestinal: Negative for worsening distension or other blood Genitourinary: Negative for retention or other urinary change Musculoskeletal: Negative for other MSK pain or swelling Skin: Negative for color change or other new lesions Neurological: Negative for worsening tremors and other numbness  Psychiatric/Behavioral:  Negative for worsening agitation or other fatigue All other system neg per pt    Objective:   Physical Exam BP (!) 154/98   Pulse 89   Temp 98.6 F (37 C)   Ht 5\' 6"  (1.676 m)   Wt 219 lb (99.3 kg)   SpO2 99%   BMI 35.35 kg/m  VS noted,  Constitutional: Pt appears in NAD HENT: Head: NCAT.  Right Ear: External ear normal.  Left Ear: External ear normal.  Eyes: . Pupils are equal, round, and reactive to light. Conjunctivae and EOM are normal Nose: without d/c or deformity Neck: Neck supple. Gross normal ROM Cardiovascular: Normal rate and regular rhythm.   Pulmonary/Chest: Effort normal and breath sounds without rales or wheezing.  Abd:  Soft, NT, ND, + BS, no organomegaly Spine: mild to mod tender low mid lumbar spine without swelling or rash Neurological: Pt is alert. At baseline orientation, motor grossly intact Skin: Skin is warm. No rashes, other new lesions, no LE edema Psychiatric: Pt behavior is normal without agitation  No other exam findings    Assessment & Plan:

## 2017-03-29 NOTE — Patient Instructions (Signed)
Please take all new medication as prescribed - the pain medication, and muscle relaxer as needed  You are given the work note today  Please continue all other medications as before, and refills have been done if requested.  Please have the pharmacy call with any other refills you may need.  Please keep your appointments with your specialists as you may have planned

## 2017-03-31 NOTE — Assessment & Plan Note (Signed)
Exam c/w marked msk strain, for pain control, muscle relaxer prn,  to f/u any worsening symptoms or concerns

## 2017-03-31 NOTE — Assessment & Plan Note (Signed)
Mild uncontrolled, o/w stable overall by history and exam, recent data reviewed with pt, declines further change in tx today, and pt to continue medical treatment as before,  to f/u any worsening symptoms or concerns BP Readings from Last 3 Encounters:  03/29/17 (!) 154/98  11/26/16 (!) 152/84  09/27/16 (!) 192/120

## 2017-03-31 NOTE — Assessment & Plan Note (Signed)
stable overall by history and exam, recent data reviewed with pt, and pt to continue medical treatment as before,  to f/u any worsening symptoms or concerns Lab Results  Component Value Date   HGBA1C 10.9 04/25/2016  No recent a1c here, pt states recent sugars under better control, cont's to [facility]

## 2017-04-01 ENCOUNTER — Other Ambulatory Visit: Payer: Self-pay

## 2017-04-01 ENCOUNTER — Ambulatory Visit: Payer: Federal, State, Local not specified - PPO | Admitting: Internal Medicine

## 2017-04-01 ENCOUNTER — Encounter: Payer: Self-pay | Admitting: Internal Medicine

## 2017-04-09 ENCOUNTER — Telehealth: Payer: Self-pay | Admitting: Internal Medicine

## 2017-04-09 NOTE — Telephone Encounter (Signed)
Patient dismissed from [facility] by Philemon Kingdom , effective April 01 2017. Dismissal letter sent out by certified / registered mail.  daj

## 2017-04-25 DIAGNOSIS — E103513 Type 1 diabetes mellitus with proliferative diabetic retinopathy with macular edema, bilateral: Secondary | ICD-10-CM | POA: Diagnosis not present

## 2017-04-25 DIAGNOSIS — H3582 Retinal ischemia: Secondary | ICD-10-CM | POA: Diagnosis not present

## 2017-05-06 DIAGNOSIS — E103511 Type 1 diabetes mellitus with proliferative diabetic retinopathy with macular edema, right eye: Secondary | ICD-10-CM | POA: Diagnosis not present

## 2017-05-16 DIAGNOSIS — E103512 Type 1 diabetes mellitus with proliferative diabetic retinopathy with macular edema, left eye: Secondary | ICD-10-CM | POA: Diagnosis not present

## 2017-05-31 NOTE — Telephone Encounter (Signed)
Certified dismissal letter returned as undeliverable, unclaimed, return to sender after three attempts by [employer] on May 31, 2017. Letter placed in another envelope and resent as 1st class mail which does not require a signature. daj

## 2017-08-21 ENCOUNTER — Other Ambulatory Visit: Payer: Self-pay | Admitting: Cardiovascular Disease

## 2017-08-21 NOTE — Telephone Encounter (Signed)
Please review for refill, Thanks !  

## 2017-09-05 ENCOUNTER — Encounter: Payer: Self-pay | Admitting: Family

## 2017-09-05 ENCOUNTER — Ambulatory Visit: Payer: Federal, State, Local not specified - PPO | Admitting: Family

## 2017-09-05 VITALS — BP 150/88 | HR 91 | Temp 98.2°F | Ht 66.0 in | Wt 210.0 lb

## 2017-09-05 DIAGNOSIS — G4762 Sleep related leg cramps: Secondary | ICD-10-CM

## 2017-09-05 DIAGNOSIS — N184 Chronic kidney disease, stage 4 (severe): Secondary | ICD-10-CM | POA: Diagnosis not present

## 2017-09-05 DIAGNOSIS — E1159 Type 2 diabetes mellitus with other circulatory complications: Secondary | ICD-10-CM

## 2017-09-05 DIAGNOSIS — E1165 Type 2 diabetes mellitus with hyperglycemia: Secondary | ICD-10-CM

## 2017-09-05 DIAGNOSIS — E1122 Type 2 diabetes mellitus with diabetic chronic kidney disease: Secondary | ICD-10-CM

## 2017-09-05 DIAGNOSIS — R0602 Shortness of breath: Secondary | ICD-10-CM | POA: Diagnosis not present

## 2017-09-05 DIAGNOSIS — I10 Essential (primary) hypertension: Secondary | ICD-10-CM | POA: Diagnosis not present

## 2017-09-05 DIAGNOSIS — I5032 Chronic diastolic (congestive) heart failure: Secondary | ICD-10-CM

## 2017-09-05 MED ORDER — AMLODIPINE BESYLATE 5 MG PO TABS: 5.0000 mg | ORAL_TABLET | Freq: Every day | ORAL | 1 refills | Status: DC

## 2017-09-05 NOTE — Progress Notes (Signed)
Hector Mahoney is a 47 y.o. male with the following history as recorded in EpicCare:  Patient Active Problem List   Diagnosis Date Noted  . Low back pain 03/29/2017  . Allergic rhinitis 05/04/2016  . Nocturnal leg cramps 04/03/2016  . Costochondritis 12/02/2015  . Hypertensive urgency 09/02/2015  . Chronic diastolic heart failure, NYHA class 3 ([place]) 09/02/2015  . Dehydration 09/02/2015  . Patient nonadherence 09/02/2015  . CKD stage 4 due to type 2 diabetes mellitus ([place]) 09/02/2015  . Acute sinus infection 08/24/2015  . Poorly controlled type 2 diabetes mellitus with circulatory disorder ([place]) 12/22/2012  . Back pain 12/28/2011  . Preventative health care 04/01/2011  . Esophageal reflux 04/27/2009  . INSOMNIA-SLEEP DISORDER-UNSPEC 04/27/2009  . FATIGUE 04/27/2009  . RASH-NONVESICULAR 03/11/2008  . Hyperlipidemia 04/24/2007  . Essential hypertension 04/24/2007    Current Outpatient Medications  Medication Sig Dispense Refill  . amLODipine (NORVASC) 5 MG tablet Take 1 tablet (5 mg total) by mouth daily. 30 tablet 1  . aspirin EC 81 MG EC tablet Take 1 tablet (81 mg total) by mouth daily. 30 tablet 0  . carvedilol (COREG) 25 MG tablet Take 1 tablet (25 mg total) by mouth 2 (two) times daily. 60 tablet 11  . cyclobenzaprine (FLEXERIL) 5 MG tablet Take 1 tablet (5 mg total) by mouth at bedtime. 90 tablet 1  . furosemide (LASIX) 40 MG tablet Take 40 mg by mouth as directed. 1 TABLET BY MOUTH ON Monday, Wednesday, AND Friday ONLY    . insulin aspart (NOVOLOG) 100 UNIT/ML injection Inject 8-12 Units into the skin 3 (three) times daily with meals. (Patient taking differently: Inject 20 Units into the skin 3 (three) times daily with meals. ) 2 vial 5  . insulin glargine (LANTUS) 100 UNIT/ML injection Inject 0.4 mLs (40 Units total) into the skin at bedtime. 20 mL 5  . Insulin Syringe-Needle U-100 (B-D INS SYRINGE 0.5CC/31GX5/16) 31G X 5/16" 0.5 ML MISC Use 4x a day 300 each 5  .  Insulin Syringe-Needle U-100 (RELION INSULIN SYRINGE) 31G X 15/64" 0.5 ML MISC Use 4 times daily. 300 each 5  . isosorbide mononitrate (IMDUR) 30 MG 24 hr tablet TAKE ONE-HALF TABLET BY MOUTH ONCE DAILY 90 tablet 1  . rOPINIRole (REQUIP) 0.25 MG tablet TAKE 1 TABLET BY MOUTH AT BEDTIME 90 tablet 3  . rosuvastatin (CRESTOR) 20 MG tablet TAKE 1 TABLET BY MOUTH ONCE DAILY 30 tablet 3  . traMADol (ULTRAM) 50 MG tablet Take 1 tablet (50 mg total) by mouth every 8 (eight) hours as needed. 60 tablet 0   No current facility-administered medications for this visit.     Allergies: Patient has no known allergies.  Past Medical History:  Diagnosis Date  . ABSCESS, TOOTH 04/23/2009  . AKI (acute kidney injury) ([place]) 08/2015  . CHF (congestive heart failure) ([place])   . DIABETES MELLITUS, TYPE I 07/30/2007  . Esophageal reflux 04/27/2009  . FATIGUE 04/27/2009  . HYPERLIPIDEMIA 04/24/2007  . HYPERTENSION 04/24/2007  . INSOMNIA-SLEEP DISORDER-UNSPEC 04/27/2009  . LUMBAR STRAIN, ACUTE 12/11/2008  . RASH-NONVESICULAR 03/11/2008  . Shortness of breath    "lying down and w/exertion right now" (12/22/2012)    Past Surgical History:  Procedure Laterality Date  . MOUTH SURGERY      Family History  Problem Relation Age of Onset  . Hypertension Mother     Social History   Tobacco Use  . Smoking status: Former Smoker    Types: Cigarettes  Last attempt to quit: 05/03/2015    Years since quitting: 2.3  . Smokeless tobacco: Never Used  . Tobacco comment: 12/22/2012 "used to smoke cigarettes once or twice/month; haven't had any cigarettes for years"  Substance Use Topics  . Alcohol use: Yes    Alcohol/week: 1.2 oz    Types: 1 Cans of beer, 1 Shots of liquor per week    Comment: 12/22/2012 "once mixed or one beer 2 times/week"    Subjective:  Patient presents with concerns for chronic problems with leg cramps at night; not a new issue- was in hospital in 2017 with similar symptoms; felt to be related to [place]  Leg- only on Requip .25; does not currently take any type of potassium supplement and takes Lasix  40 mg ( per cardiology note, was supposed to be taking 20 mg but he didn't feel this dose was controlling his fluid) 3 x per week;   History of Stage 4 CKD- nephrology q 2-3 months; " I haven't been in a while." Cardiology for heart failure- Dr. Yvetta Coder; "I don't even remember the last time I saw her." According to notes, last saw her in 11/2016- was due to see her in June 2018;  Type 2 Diabetes- managed by [facility] in [place]; scheduled to see them next month; Last Hgba1c was around 9-10;   Objective:  Vitals:   09/05/17 1310  BP: (!) 150/88  Pulse: 91  Temp: 98.2 F (36.8 C)  TempSrc: Oral  SpO2: 98%  Weight: 210 lb (95.3 kg)  Height: 5\' 6"  (1.676 m)    General: Well developed, well nourished, in no acute distress  Skin : Warm and dry.  Head: Normocephalic and atraumatic  Eyes: Sclera and conjunctiva clear; pupils round and reactive to light; extraocular movements intact  Ears: External normal; canals clear; tympanic membranes normal  Oropharynx: Pink, supple. No suspicious lesions  Neck: Supple without thyromegaly, adenopathy  Lungs: Respirations unlabored; clear to auscultation bilaterally without wheeze, rales, rhonchi  CVS exam: normal rate and regular rhythm.  Abdomen: Soft; nontender; nondistended; normoactive bowel sounds; no masses or hepatosplenomegaly  Musculoskeletal: No deformities; no active joint inflammation  Extremities: No edema, cyanosis, clubbing  Vessels: Symmetric bilaterally  Neurologic: Alert and oriented; speech intact; face symmetrical; moves all extremities well; CNII-XII intact without focal deficit  Assessment:  1. Chronic diastolic heart failure, NYHA class 3 ([place])   2. Essential hypertension   3. Nocturnal leg cramps   4. Shortness of breath   5. CKD stage 4 due to type 2 diabetes mellitus ([place])   6. Poorly controlled type 2 diabetes mellitus with  circulatory disorder ([place])     Plan:  1. Update CXR today; check BNP; refer back to his cardiologist; stressed need to see them regularly/ keep follow-up appointments; will let them decide if Lasix needs to be adjusted again as patient has concerns that current regimen is not effective. 2. Uncontrolled; increase Amlodipine to 5 mg; he notes this is managed by cardiology- follow-up to be done there. 3. Check CMP, magnesium; if normal, will consider increased Requip dose. 4. Check CXR; 5. Stressed importance of seeing nephrologist regularly; he agrees to schedule appointment; 6. [facility];  He asks that his PCP complete FMLA paperwork for him- will forward to Dr. Jenny Reichmann; patient understands he may have to schedule OV.   Return for with Dr. Jenny Reichmann.  Orders Placed This Encounter  Procedures  . DG Chest 2 View  Standing Status:   Future    Standing Expiration Date:   11/04/2018    Order Specific Question:   Reason for Exam (SYMPTOM  OR DIAGNOSIS REQUIRED)    Answer:   shortness of breath    Order Specific Question:   Preferred imaging location?    Answer:   [facility]    Order Specific Question:   Radiology Contrast Protocol - do NOT remove file path    Answer:   [email or web address]  . CBC w/Diff    Standing Status:   Future    Standing Expiration Date:   09/05/2018  . Comprehensive metabolic panel    Standing Status:   Future    Standing Expiration Date:   09/05/2018  . Magnesium    Standing Status:   Future    Standing Expiration Date:   09/05/2018  . B Nat Peptide    Standing Status:   Future    Standing Expiration Date:   09/05/2018  . Ambulatory referral to Cardiology    Referral Priority:   Routine    Referral Type:   Consultation    Referral Reason:   Specialty Services Required    Requested Specialty:   Cardiology    Number of Visits Requested:   1    Requested Prescriptions    Signed Prescriptions Disp Refills  . amLODipine (NORVASC) 5 MG tablet 30 tablet 1    Sig: Take 1 tablet (5 mg total) by mouth daily.

## 2017-09-05 NOTE — Patient Instructions (Signed)
Call your nephrologist today to schedule OV; we need to get you back to cardiology as well- that referral has been updated;

## 2017-09-09 ENCOUNTER — Telehealth: Payer: Self-pay | Admitting: Internal Medicine

## 2017-09-09 NOTE — Telephone Encounter (Signed)
Patient has dropped off FMLA to completed. The last time I see it was done was in 2014. Patient last saw you in 03/2017.   Please advise, and or if patient would need an appointment. Thank you.

## 2017-09-09 NOTE — Telephone Encounter (Signed)
Needs OV please.

## 2017-09-10 DIAGNOSIS — Z0279 Encounter for issue of other medical certificate: Secondary | ICD-10-CM

## 2017-09-10 NOTE — Telephone Encounter (Signed)
Patient as set up an appointment for 09/13/17 for his FMLA.

## 2017-09-12 NOTE — Telephone Encounter (Signed)
Forms have been completed and given to MD's CMA for appointment tomorrow. Just need the days part filled out.

## 2017-09-13 ENCOUNTER — Encounter: Payer: Self-pay | Admitting: Internal Medicine

## 2017-09-13 ENCOUNTER — Ambulatory Visit: Payer: Federal, State, Local not specified - PPO | Admitting: Internal Medicine

## 2017-09-13 VITALS — BP 130/88 | HR 95 | Temp 98.0°F | Ht 66.0 in | Wt 212.0 lb

## 2017-09-13 DIAGNOSIS — I10 Essential (primary) hypertension: Secondary | ICD-10-CM

## 2017-09-13 DIAGNOSIS — E1159 Type 2 diabetes mellitus with other circulatory complications: Secondary | ICD-10-CM

## 2017-09-13 DIAGNOSIS — E1165 Type 2 diabetes mellitus with hyperglycemia: Secondary | ICD-10-CM

## 2017-09-13 DIAGNOSIS — I5032 Chronic diastolic (congestive) heart failure: Secondary | ICD-10-CM

## 2017-09-13 NOTE — Patient Instructions (Signed)
We wil find the FMLA form and sign fo ryou to pick up soon.  Please continue all other medications as before, and refills have been done if requested.  Please have the pharmacy call with any other refills you may need.  Please continue your efforts at being more active, low cholesterol diet, and weight control.  Please keep your appointments with your specialists as you may have planned

## 2017-09-13 NOTE — Progress Notes (Signed)
Subjective:    Patient ID: Hector Mahoney, male    DOB: 12/09/1970, 47 y.o.   MRN: [id number]  HPI   Here for FMLA to be signed, as well as f/u DM and dCHF; overall doing ok,  Pt denies chest pain, increasing sob or doe, wheezing, orthopnea, PND, increased LE swelling, palpitations, dizziness or syncope.  Pt denies new neurological symptoms such as new headache, or facial or extremity weakness or numbness.  Pt denies polydipsia, polyuria, or low sugar episode.  Pt states overall good compliance with meds, mostly trying to follow appropriate diet, with wt overall stable,  but little exercise however.  Did have a couple of low sugars last wek, Normally followed at [place], states last a1c about 9.4 in Nov 2018, has f/u later this month.   Pt denies fever, wt loss, night sweats, loss of appetite, or other constitutional symptoms  No other complaints or interval hx. Past Medical History:  Diagnosis Date  . ABSCESS, TOOTH 04/23/2009  . AKI (acute kidney injury) ([place]) 08/2015  . CHF (congestive heart failure) ([place])   . DIABETES MELLITUS, TYPE I 07/30/2007  . Esophageal reflux 04/27/2009  . FATIGUE 04/27/2009  . HYPERLIPIDEMIA 04/24/2007  . HYPERTENSION 04/24/2007  . INSOMNIA-SLEEP DISORDER-UNSPEC 04/27/2009  . LUMBAR STRAIN, ACUTE 12/11/2008  . RASH-NONVESICULAR 03/11/2008  . Shortness of breath    "lying down and w/exertion right now" (12/22/2012)   Past Surgical History:  Procedure Laterality Date  . MOUTH SURGERY      reports that he quit smoking about 2 years ago. His smoking use included cigarettes. he has never used smokeless tobacco. He reports that he drinks about 1.2 oz of alcohol per week. He reports that he does not use drugs. family history includes Hypertension in his mother. No Known Allergies Current Outpatient Medications on File Prior to Visit  Medication Sig Dispense Refill  . amLODipine (NORVASC) 5 MG tablet Take 1 tablet (5 mg total) by mouth daily. 30 tablet 1  . aspirin EC 81 MG  EC tablet Take 1 tablet (81 mg total) by mouth daily. 30 tablet 0  . carvedilol (COREG) 25 MG tablet Take 1 tablet (25 mg total) by mouth 2 (two) times daily. 60 tablet 11  . cyclobenzaprine (FLEXERIL) 5 MG tablet Take 1 tablet (5 mg total) by mouth at bedtime. 90 tablet 1  . furosemide (LASIX) 40 MG tablet Take 40 mg by mouth as directed. 1 TABLET BY MOUTH ON Monday, Wednesday, AND Friday ONLY    . insulin aspart (NOVOLOG) 100 UNIT/ML injection Inject 8-12 Units into the skin 3 (three) times daily with meals. (Patient taking differently: Inject 20 Units into the skin 3 (three) times daily with meals. ) 2 vial 5  . insulin glargine (LANTUS) 100 UNIT/ML injection Inject 0.4 mLs (40 Units total) into the skin at bedtime. 20 mL 5  . Insulin Syringe-Needle U-100 (B-D INS SYRINGE 0.5CC/31GX5/16) 31G X 5/16" 0.5 ML MISC Use 4x a day 300 each 5  . Insulin Syringe-Needle U-100 (RELION INSULIN SYRINGE) 31G X 15/64" 0.5 ML MISC Use 4 times daily. 300 each 5  . isosorbide mononitrate (IMDUR) 30 MG 24 hr tablet TAKE ONE-HALF TABLET BY MOUTH ONCE DAILY 90 tablet 1  . rOPINIRole (REQUIP) 0.25 MG tablet TAKE 1 TABLET BY MOUTH AT BEDTIME 90 tablet 3  . rosuvastatin (CRESTOR) 20 MG tablet TAKE 1 TABLET BY MOUTH ONCE DAILY 30 tablet 3  . traMADol (ULTRAM) 50 MG tablet Take 1 tablet (50 mg  total) by mouth every 8 (eight) hours as needed. 60 tablet 0   No current facility-administered medications on file prior to visit.    Review of Systems  Constitutional: Negative for other unusual diaphoresis or sweats HENT: Negative for ear discharge or swelling Eyes: Negative for other worsening visual disturbances Respiratory: Negative for stridor or other swelling  Gastrointestinal: Negative for worsening distension or other blood Genitourinary: Negative for retention or other urinary change Musculoskeletal: Negative for other MSK pain or swelling Skin: Negative for color change or other new lesions Neurological: Negative  for worsening tremors and other numbness  Psychiatric/Behavioral: Negative for worsening agitation or other fatigue All other system neg per pt    Objective:   Physical Exam BP 130/88   Pulse 95   Temp 98 F (36.7 C) (Oral)   Ht 5\' 6"  (1.676 m)   Wt 212 lb (96.2 kg)   SpO2 96%   BMI 34.22 kg/m  VS noted,  Constitutional: Pt appears in NAD HENT: Head: NCAT.  Right Ear: External ear normal.  Left Ear: External ear normal.  Eyes: . Pupils are equal, round, and reactive to light. Conjunctivae and EOM are normal Nose: without d/c or deformity Neck: Neck supple. Gross normal ROM Cardiovascular: Normal rate and regular rhythm.   Pulmonary/Chest: Effort normal and breath sounds without rales or wheezing.  Abd:  Soft, NT, ND, + BS, no organomegaly Neurological: Pt is alert. At baseline orientation, motor grossly intact Skin: Skin is warm. No rashes, other new lesions, no LE edema Psychiatric: Pt behavior is normal without agitation  No other exam findings    Assessment & Plan:

## 2017-09-14 ENCOUNTER — Encounter: Payer: Self-pay | Admitting: Internal Medicine

## 2017-09-14 NOTE — Assessment & Plan Note (Addendum)
stable overall by history and exam, recent data reviewed with pt, and pt to continue medical treatment as before,  to f/u any worsening symptoms or concerns but pt plans to cont to f/u regularly at [facility]

## 2017-09-14 NOTE — Assessment & Plan Note (Signed)
stable overall by history and exam, recent data reviewed with pt, and pt to continue medical treatment as before,  to f/u any worsening symptoms or concerns BP Readings from Last 3 Encounters:  09/13/17 130/88  09/05/17 (!) 150/88  03/29/17 (!) 154/98

## 2017-09-14 NOTE — Assessment & Plan Note (Addendum)
stable overall by history and exam, recent data reviewed with pt, and pt to continue medical treatment as before with lasix 40 mg qod as well as prn for worsening dyspnea and edema,  to f/u any worsening symptoms or concerns, FMLA to be signed

## 2017-09-16 NOTE — Telephone Encounter (Signed)
Forms have been completed & signed, Mailed, Copy sent to scan, Copy mailed to patient, &charged for. 1..29 bo

## 2017-09-27 ENCOUNTER — Observation Stay (HOSPITAL_COMMUNITY)
Admission: EM | Admit: 2017-09-27 | Discharge: 2017-09-28 | Disposition: A | Discharge status: Home / Self Care | Payer: Federal, State, Local not specified - PPO | Attending: Internal Medicine | Admitting: Internal Medicine

## 2017-09-27 ENCOUNTER — Observation Stay (HOSPITAL_BASED_OUTPATIENT_CLINIC_OR_DEPARTMENT_OTHER): Payer: Federal, State, Local not specified - PPO

## 2017-09-27 ENCOUNTER — Encounter (HOSPITAL_COMMUNITY): Payer: Self-pay | Admitting: Emergency Medicine

## 2017-09-27 ENCOUNTER — Observation Stay (HOSPITAL_BASED_OUTPATIENT_CLINIC_OR_DEPARTMENT_OTHER)
Admit: 2017-09-27 | Discharge: 2017-09-27 | Disposition: A | Discharge status: Home / Self Care | Payer: Federal, State, Local not specified - PPO | Attending: Nephrology | Admitting: Nephrology

## 2017-09-27 ENCOUNTER — Emergency Department (HOSPITAL_COMMUNITY): Payer: Federal, State, Local not specified - PPO

## 2017-09-27 ENCOUNTER — Other Ambulatory Visit: Payer: Self-pay

## 2017-09-27 DIAGNOSIS — R252 Cramp and spasm: Secondary | ICD-10-CM | POA: Insufficient documentation

## 2017-09-27 DIAGNOSIS — D631 Anemia in chronic kidney disease: Secondary | ICD-10-CM | POA: Diagnosis not present

## 2017-09-27 DIAGNOSIS — E1022 Type 1 diabetes mellitus with diabetic chronic kidney disease: Secondary | ICD-10-CM | POA: Diagnosis not present

## 2017-09-27 DIAGNOSIS — E1121 Type 2 diabetes mellitus with diabetic nephropathy: Secondary | ICD-10-CM | POA: Insufficient documentation

## 2017-09-27 DIAGNOSIS — I120 Hypertensive chronic kidney disease with stage 5 chronic kidney disease or end stage renal disease: Secondary | ICD-10-CM | POA: Diagnosis not present

## 2017-09-27 DIAGNOSIS — I340 Nonrheumatic mitral (valve) insufficiency: Secondary | ICD-10-CM

## 2017-09-27 DIAGNOSIS — I5043 Acute on chronic combined systolic (congestive) and diastolic (congestive) heart failure: Secondary | ICD-10-CM | POA: Insufficient documentation

## 2017-09-27 DIAGNOSIS — I5023 Acute on chronic systolic (congestive) heart failure: Secondary | ICD-10-CM

## 2017-09-27 DIAGNOSIS — N179 Acute kidney failure, unspecified: Secondary | ICD-10-CM | POA: Insufficient documentation

## 2017-09-27 DIAGNOSIS — Z7982 Long term (current) use of aspirin: Secondary | ICD-10-CM | POA: Diagnosis not present

## 2017-09-27 DIAGNOSIS — K219 Gastro-esophageal reflux disease without esophagitis: Secondary | ICD-10-CM | POA: Insufficient documentation

## 2017-09-27 DIAGNOSIS — Z79899 Other long term (current) drug therapy: Secondary | ICD-10-CM | POA: Insufficient documentation

## 2017-09-27 DIAGNOSIS — I132 Hypertensive heart and chronic kidney disease with heart failure and with stage 5 chronic kidney disease, or end stage renal disease: Principal | ICD-10-CM | POA: Insufficient documentation

## 2017-09-27 DIAGNOSIS — N185 Chronic kidney disease, stage 5: Secondary | ICD-10-CM

## 2017-09-27 DIAGNOSIS — N19 Unspecified kidney failure: Secondary | ICD-10-CM

## 2017-09-27 DIAGNOSIS — Z794 Long term (current) use of insulin: Secondary | ICD-10-CM | POA: Diagnosis not present

## 2017-09-27 DIAGNOSIS — I10 Essential (primary) hypertension: Secondary | ICD-10-CM | POA: Diagnosis present

## 2017-09-27 DIAGNOSIS — N289 Disorder of kidney and ureter, unspecified: Secondary | ICD-10-CM | POA: Diagnosis not present

## 2017-09-27 DIAGNOSIS — R0600 Dyspnea, unspecified: Secondary | ICD-10-CM | POA: Diagnosis present

## 2017-09-27 DIAGNOSIS — E785 Hyperlipidemia, unspecified: Secondary | ICD-10-CM | POA: Insufficient documentation

## 2017-09-27 DIAGNOSIS — Z87891 Personal history of nicotine dependence: Secondary | ICD-10-CM | POA: Diagnosis not present

## 2017-09-27 DIAGNOSIS — R0602 Shortness of breath: Secondary | ICD-10-CM

## 2017-09-27 DIAGNOSIS — E1122 Type 2 diabetes mellitus with diabetic chronic kidney disease: Secondary | ICD-10-CM | POA: Diagnosis not present

## 2017-09-27 DIAGNOSIS — I110 Hypertensive heart disease with heart failure: Secondary | ICD-10-CM | POA: Diagnosis not present

## 2017-09-27 DIAGNOSIS — Z9114 Patient's other noncompliance with medication regimen: Secondary | ICD-10-CM | POA: Diagnosis not present

## 2017-09-27 DIAGNOSIS — N189 Chronic kidney disease, unspecified: Secondary | ICD-10-CM

## 2017-09-27 DIAGNOSIS — N186 End stage renal disease: Secondary | ICD-10-CM | POA: Diagnosis not present

## 2017-09-27 DIAGNOSIS — I150 Renovascular hypertension: Secondary | ICD-10-CM | POA: Diagnosis not present

## 2017-09-27 DIAGNOSIS — I161 Hypertensive emergency: Secondary | ICD-10-CM | POA: Diagnosis not present

## 2017-09-27 DIAGNOSIS — R0609 Other forms of dyspnea: Secondary | ICD-10-CM

## 2017-09-27 LAB — BASIC METABOLIC PANEL
ANION GAP: 14 (ref 5–15)
BUN: 92 mg/dL — AB (ref 6–20)
CO2: 19 mmol/L — ABNORMAL LOW (ref 22–32)
Calcium: 7.5 mg/dL — ABNORMAL LOW (ref 8.9–10.3)
Chloride: 105 mmol/L (ref 101–111)
Creatinine, Ser: 8.86 mg/dL — ABNORMAL HIGH (ref 0.61–1.24)
GFR calc Af Amer: 7 mL/min — ABNORMAL LOW (ref >60)
GFR calc non Af Amer: 6 mL/min — ABNORMAL LOW (ref >60)
GLUCOSE: 145 mg/dL — AB (ref 65–99)
POTASSIUM: 3.8 mmol/L (ref 3.5–5.1)
Sodium: 138 mmol/L (ref 135–145)

## 2017-09-27 LAB — HEPATIC FUNCTION PANEL
ALBUMIN: 3.3 g/dL — AB (ref 3.5–5.0)
ALT: 21 U/L (ref 17–63)
AST: 16 U/L (ref 15–41)
Alkaline Phosphatase: 97 U/L (ref 38–126)
BILIRUBIN DIRECT: 0.2 mg/dL (ref 0.1–0.5)
Indirect Bilirubin: 0.7 mg/dL (ref 0.3–0.9)
Total Bilirubin: 0.9 mg/dL (ref 0.3–1.2)
Total Protein: 7.3 g/dL (ref 6.5–8.1)

## 2017-09-27 LAB — I-STAT TROPONIN, ED: Troponin i, poc: 0.03 ng/mL (ref 0.00–0.08)

## 2017-09-27 LAB — CBC
HEMATOCRIT: 29.6 % — AB (ref 39.0–52.0)
HEMOGLOBIN: 9.4 g/dL — AB (ref 13.0–17.0)
MCH: 23.8 pg — ABNORMAL LOW (ref 26.0–34.0)
MCHC: 31.8 g/dL (ref 30.0–36.0)
MCV: 74.9 fL — ABNORMAL LOW (ref 78.0–100.0)
Platelets: 283 10*3/uL (ref 150–400)
RBC: 3.95 MIL/uL — ABNORMAL LOW (ref 4.22–5.81)
RDW: 16.2 % — AB (ref 11.5–15.5)
WBC: 8 10*3/uL (ref 4.0–10.5)

## 2017-09-27 LAB — GLUCOSE, CAPILLARY
GLUCOSE-CAPILLARY: 241 mg/dL — AB (ref 65–99)
Glucose-Capillary: 199 mg/dL — ABNORMAL HIGH (ref 65–99)

## 2017-09-27 LAB — IRON AND TIBC
IRON: 40 ug/dL — AB (ref 45–182)
SATURATION RATIOS: 12 % — AB (ref 17.9–39.5)
TIBC: 340 ug/dL (ref 250–450)
UIBC: 300 ug/dL

## 2017-09-27 LAB — BRAIN NATRIURETIC PEPTIDE: B NATRIURETIC PEPTIDE 5: 369.6 pg/mL — AB (ref 0.0–100.0)

## 2017-09-27 LAB — FERRITIN: Ferritin: 159 ng/mL (ref 24–336)

## 2017-09-27 LAB — PHOSPHORUS: Phosphorus: 5.9 mg/dL — ABNORMAL HIGH (ref 2.5–4.6)

## 2017-09-27 MED ORDER — FUROSEMIDE 10 MG/ML IJ SOLN
80.0000 mg | Freq: Once | INTRAMUSCULAR | Status: AC
  Administered 2017-09-27: 80 mg via INTRAVENOUS
  Filled 2017-09-27: qty 8

## 2017-09-27 MED ORDER — HEPARIN SODIUM (PORCINE) 5000 UNIT/ML IJ SOLN
5000.0000 [IU] | Freq: Two times a day (BID) | INTRAMUSCULAR | Status: DC
  Administered 2017-09-27 (×2): 5000 [IU] via SUBCUTANEOUS
  Filled 2017-09-27 (×4): qty 1

## 2017-09-27 MED ORDER — HYDRALAZINE HCL 10 MG PO TABS
10.0000 mg | ORAL_TABLET | Freq: Three times a day (TID) | ORAL | Status: DC
  Administered 2017-09-27 – 2017-09-28 (×3): 10 mg via ORAL
  Filled 2017-09-27 (×4): qty 1

## 2017-09-27 MED ORDER — ISOSORBIDE MONONITRATE ER 30 MG PO TB24
15.0000 mg | ORAL_TABLET | Freq: Every day | ORAL | Status: DC
  Administered 2017-09-27 – 2017-09-28 (×2): 15 mg via ORAL
  Filled 2017-09-27 (×2): qty 1

## 2017-09-27 MED ORDER — ROPINIROLE HCL 0.25 MG PO TABS
0.2500 mg | ORAL_TABLET | Freq: Every day | ORAL | Status: DC
  Administered 2017-09-27: 0.25 mg via ORAL
  Filled 2017-09-27: qty 1

## 2017-09-27 MED ORDER — INSULIN ASPART 100 UNIT/ML ~~LOC~~ SOLN
0.0000 [IU] | Freq: Three times a day (TID) | SUBCUTANEOUS | Status: DC
  Administered 2017-09-27: 2 [IU] via SUBCUTANEOUS
  Administered 2017-09-28: 3 [IU] via SUBCUTANEOUS
  Administered 2017-09-28: 2 [IU] via SUBCUTANEOUS

## 2017-09-27 MED ORDER — AMLODIPINE BESYLATE 5 MG PO TABS: 5.0000 mg | ORAL_TABLET | Freq: Every day | ORAL | Status: DC

## 2017-09-27 MED ORDER — HYDRALAZINE HCL 20 MG/ML IJ SOLN: 10.0000 mg | Freq: Three times a day (TID) | INTRAMUSCULAR | Status: DC | PRN

## 2017-09-27 MED ORDER — CYCLOBENZAPRINE HCL 5 MG PO TABS
5.0000 mg | ORAL_TABLET | Freq: Every day | ORAL | Status: DC
  Administered 2017-09-27: 5 mg via ORAL
  Filled 2017-09-27: qty 1

## 2017-09-27 MED ORDER — FUROSEMIDE 10 MG/ML IJ SOLN
80.0000 mg | Freq: Every day | INTRAMUSCULAR | Status: DC
  Administered 2017-09-28: 80 mg via INTRAVENOUS
  Filled 2017-09-27: qty 8

## 2017-09-27 MED ORDER — INSULIN GLARGINE 100 UNIT/ML ~~LOC~~ SOLN
40.0000 [IU] | Freq: Every day | SUBCUTANEOUS | Status: DC
  Administered 2017-09-27: 40 [IU] via SUBCUTANEOUS
  Filled 2017-09-27 (×3): qty 0.4

## 2017-09-27 MED ORDER — AMLODIPINE BESYLATE 10 MG PO TABS
10.0000 mg | ORAL_TABLET | Freq: Every day | ORAL | Status: DC
  Administered 2017-09-27 – 2017-09-28 (×2): 10 mg via ORAL
  Filled 2017-09-27: qty 1
  Filled 2017-09-27: qty 2

## 2017-09-27 MED ORDER — CARVEDILOL 25 MG PO TABS
25.0000 mg | ORAL_TABLET | Freq: Two times a day (BID) | ORAL | Status: DC
  Filled 2017-09-27: qty 1

## 2017-09-27 MED ORDER — LABETALOL HCL 200 MG PO TABS
200.0000 mg | ORAL_TABLET | Freq: Two times a day (BID) | ORAL | Status: DC
  Administered 2017-09-27 – 2017-09-28 (×3): 200 mg via ORAL
  Filled 2017-09-27 (×3): qty 1

## 2017-09-27 MED ORDER — ASPIRIN EC 81 MG PO TBEC
81.0000 mg | DELAYED_RELEASE_TABLET | Freq: Every day | ORAL | Status: DC
  Administered 2017-09-27 – 2017-09-28 (×2): 81 mg via ORAL
  Filled 2017-09-27 (×2): qty 1

## 2017-09-27 MED ORDER — FUROSEMIDE 40 MG PO TABS
80.0000 mg | ORAL_TABLET | Freq: Two times a day (BID) | ORAL | Status: DC
  Administered 2017-09-27: 80 mg via ORAL
  Filled 2017-09-27: qty 2

## 2017-09-27 MED ORDER — ROSUVASTATIN CALCIUM 5 MG PO TABS
5.0000 mg | ORAL_TABLET | Freq: Every day | ORAL | Status: DC
  Administered 2017-09-27: 5 mg via ORAL
  Filled 2017-09-27 (×2): qty 1

## 2017-09-27 NOTE — ED Notes (Signed)
Bed: WA04 Expected date:  Expected time:  Means of arrival:  Comments: 

## 2017-09-27 NOTE — Progress Notes (Signed)
  Echocardiogram 2D Echocardiogram has been performed.  Hector Mahoney T Hector Mahoney 09/27/2017, 5:33 PM

## 2017-09-27 NOTE — ED Notes (Signed)
Echocardiogram at patient bedside.

## 2017-09-27 NOTE — ED Triage Notes (Signed)
Pt states he has a history of CHF and lately has felt more short of breath especially when he is exerting himself or when he lays down  Pt states he also has been having leg cramps at night  Denies swelling in his legs or feet  Pt states he takes lasix 20mg  on Monday, Wednesday, and Friday but has been taking it more than that when he feels like he is building up fluid   Pt sates this has been going on for the past year but worse the past 3 months  Pt states his regular doctor is Dr Jenny Reichmann

## 2017-09-27 NOTE — ED Provider Notes (Signed)
[facility] DEPT Provider Note   CSN: [id number] Arrival date & time: 09/27/17  0609     History   Chief Complaint Chief Complaint  Patient presents with  . Shortness of Breath  . leg cramps    HPI Hector Mahoney is a 47 y.o. male.  HPI   47 year old male with history of insulin-dependent diabetes, CHF with last echo in 2017 demonstrated EF of 50-55% presenting today for evaluation of shortness of breath.  Patient report for the past 2-3 weeks patient report progressive worsening shortness of breath.  States he normally noticed increased shortness of breath when he walks up and down the steps but lately even walking on a flat surface he would get out of breath.  He also report paroxysmal nocturnal dyspnea.  He sleeps in a recliner.  He is currently on Lasix at 20 mg taken every other day according to the instruction of his primary care provider.  He has been compliant with his medication.  He did see his primary care provider 2 weeks ago who recommend patient to follow-up with his cardiologist.  His next cardiology appointment is in a month.  He is here due to progressive worsening shortness of breath.  He denies any associated fever, chills, chest pain, productive cough, hemoptysis, abdominal pain or heavy leg swelling.  He has not noticed any significant weight changes.  He tries to monitor his food and exercise but admits to "slip off" on occasion.       Past Medical History:  Diagnosis Date  . ABSCESS, TOOTH 04/23/2009  . AKI (acute kidney injury) ([place]) 08/2015  . CHF (congestive heart failure) ([place])   . DIABETES MELLITUS, TYPE I 07/30/2007  . Esophageal reflux 04/27/2009  . FATIGUE 04/27/2009  . HYPERLIPIDEMIA 04/24/2007  . HYPERTENSION 04/24/2007  . INSOMNIA-SLEEP DISORDER-UNSPEC 04/27/2009  . LUMBAR STRAIN, ACUTE 12/11/2008  . RASH-NONVESICULAR 03/11/2008  . Shortness of breath    "lying down and w/exertion right now" (12/22/2012)    Patient  Active Problem List   Diagnosis Date Noted  . Low back pain 03/29/2017  . Allergic rhinitis 05/04/2016  . Nocturnal leg cramps 04/03/2016  . Costochondritis 12/02/2015  . Hypertensive urgency 09/02/2015  . Chronic diastolic heart failure, NYHA class 3 ([place]) 09/02/2015  . Dehydration 09/02/2015  . Patient nonadherence 09/02/2015  . CKD stage 4 due to type 2 diabetes mellitus ([place]) 09/02/2015  . Acute sinus infection 08/24/2015  . Poorly controlled type 2 diabetes mellitus with circulatory disorder ([place]) 12/22/2012  . Back pain 12/28/2011  . Preventative health care 04/01/2011  . Esophageal reflux 04/27/2009  . INSOMNIA-SLEEP DISORDER-UNSPEC 04/27/2009  . FATIGUE 04/27/2009  . RASH-NONVESICULAR 03/11/2008  . Hyperlipidemia 04/24/2007  . Essential hypertension 04/24/2007    Past Surgical History:  Procedure Laterality Date  . MOUTH SURGERY         Home Medications    Prior to Admission medications   Medication Sig Start Date End Date Taking? Authorizing Provider  amLODipine (NORVASC) 5 MG tablet Take 1 tablet (5 mg total) by mouth daily. 09/05/17   Marrian Salvage, FNP  aspirin EC 81 MG EC tablet Take 1 tablet (81 mg total) by mouth daily. 09/06/15   Nita Sells, MD  carvedilol (COREG) 25 MG tablet Take 1 tablet (25 mg total) by mouth 2 (two) times daily. 09/06/15   Nita Sells, MD  cyclobenzaprine (FLEXERIL) 5 MG tablet Take 1 tablet (5 mg total) by mouth at bedtime. 03/29/17  Biagio Borg, MD  furosemide (LASIX) 40 MG tablet Take 40 mg by mouth as directed. 1 TABLET BY MOUTH ON Monday, Wednesday, AND Friday ONLY    [provider]  insulin aspart (NOVOLOG) 100 UNIT/ML injection Inject 8-12 Units into the skin 3 (three) times daily with meals. Patient taking differently: Inject 20 Units into the skin 3 (three) times daily with meals.  04/25/16   Philemon Kingdom, MD  insulin glargine (LANTUS) 100 UNIT/ML injection Inject 0.4 mLs (40 Units total)  into the skin at bedtime. 04/25/16   Philemon Kingdom, MD  Insulin Syringe-Needle U-100 (B-D INS SYRINGE 0.5CC/31GX5/16) 31G X 5/16" 0.5 ML MISC Use 4x a day 04/25/16   Philemon Kingdom, MD  Insulin Syringe-Needle U-100 (RELION INSULIN SYRINGE) 31G X 15/64" 0.5 ML MISC Use 4 times daily. 04/26/16   Philemon Kingdom, MD  isosorbide mononitrate (IMDUR) 30 MG 24 hr tablet TAKE ONE-HALF TABLET BY MOUTH ONCE DAILY 10/12/16   Biagio Borg, MD  rOPINIRole (REQUIP) 0.25 MG tablet TAKE 1 TABLET BY MOUTH AT BEDTIME 11/22/16   Biagio Borg, MD  rosuvastatin (CRESTOR) 20 MG tablet TAKE 1 TABLET BY MOUTH ONCE DAILY 08/21/17   Skeet Latch, MD  traMADol (ULTRAM) 50 MG tablet Take 1 tablet (50 mg total) by mouth every 8 (eight) hours as needed. 03/29/17   Biagio Borg, MD    Family History Family History  Problem Relation Age of Onset  . Hypertension Mother     Social History Social History   Tobacco Use  . Smoking status: Former Smoker    Types: Cigarettes    Last attempt to quit: 05/03/2015    Years since quitting: 2.4  . Smokeless tobacco: Never Used  . Tobacco comment: 12/22/2012 "used to smoke cigarettes once or twice/month; haven't had any cigarettes for years"  Substance Use Topics  . Alcohol use: Yes    Comment: occ  . Drug use: No     Allergies   Patient has no known allergies.   Review of Systems Review of Systems  All other systems reviewed and are negative.    Physical Exam Updated Vital Signs BP (!) 173/105 (BP Location: Left Arm)   Pulse 97   Temp 98.1 F (36.7 C) (Oral)   Resp 20   Ht 5\' 6"  (1.676 m)   Wt 93.9 kg (207 lb)   SpO2 98%   BMI 33.41 kg/m   Physical Exam  Constitutional: He appears well-developed and well-nourished. No distress.  Patient is well-appearing resting in bed comfortably in no acute discomfort.  HENT:  Head: Atraumatic.  Eyes: Conjunctivae are normal.  Neck: Neck supple. No hepatojugular reflux and no JVD present.  Cardiovascular:  Normal rate and regular rhythm. Exam reveals no gallop.  Pulmonary/Chest: Effort normal. No accessory muscle usage. No respiratory distress. He has no decreased breath sounds. He has no wheezes. He has no rales.  Abdominal: Soft. There is no tenderness.  Musculoskeletal:       Right lower leg: He exhibits no edema.       Left lower leg: He exhibits no edema.  Neurological: He is alert.  Skin: No rash noted.  Psychiatric: He has a normal mood and affect.  Nursing note and vitals reviewed.    ED Treatments / Results  Labs (all labs ordered are listed, but only abnormal results are displayed) Labs Reviewed  BASIC METABOLIC PANEL - Abnormal; Notable for the following components:      Result Value  CO2 19 (*)    Glucose, Bld 145 (*)    BUN 92 (*)    Creatinine, Ser 8.86 (*)    Calcium 7.5 (*)    GFR calc non Af Amer 6 (*)    GFR calc Af Amer 7 (*)    All other components within normal limits  CBC - Abnormal; Notable for the following components:   RBC 3.95 (*)    Hemoglobin 9.4 (*)    HCT 29.6 (*)    MCV 74.9 (*)    MCH 23.8 (*)    RDW 16.2 (*)    All other components within normal limits  BRAIN NATRIURETIC PEPTIDE - Abnormal; Notable for the following components:   B Natriuretic Peptide 369.6 (*)    All other components within normal limits  I-STAT TROPONIN, ED    EKG  EKG Interpretation None     ED ECG REPORT   Date: 09/27/2017  Rate: 86  Rhythm: normal sinus rhythm  QRS Axis: left  Intervals: normal  ST/T Wave abnormalities: nonspecific ST changes  Conduction Disutrbances:none  Narrative Interpretation:   Old EKG Reviewed: unchanged  I have personally reviewed the EKG tracing and agree with the computerized printout as noted.   Radiology Dg Chest 2 View  Result Date: 09/27/2017 CLINICAL DATA:  Short of breath EXAM: CHEST  2 VIEW COMPARISON:  09/02/2015 FINDINGS: The heart size and mediastinal contours are within normal limits. Both lungs are clear. The  visualized skeletal structures are unremarkable. IMPRESSION: No active cardiopulmonary disease. Electronically Signed   By: Franchot Gallo M.D.   On: 09/27/2017 07:07    Procedures Procedures (including critical care time)  Medications Ordered in ED Medications  furosemide (LASIX) injection 80 mg (not administered)     Initial Impression / Assessment and Plan / ED Course  I have reviewed the triage vital signs and the nursing notes.  Pertinent labs & imaging results that were available during my care of the patient were reviewed by me and considered in my medical decision making (see chart for details).     BP (!) 173/105 (BP Location: Left Arm)   Pulse 97   Temp 98.1 F (36.7 C) (Oral)   Resp 20   Ht 5\' 6"  (1.676 m)   Wt 93.9 kg (207 lb)   SpO2 98%   BMI 33.41 kg/m    Final Clinical Impressions(s) / ED Diagnoses   Final diagnoses:  Shortness of breath  Acute on chronic systolic congestive heart failure (HCC)  Renal failure, unspecified chronicity    ED Discharge Orders    None     10:42 AM Patient with known history of CHF here with progressive worsening shortness of breath.  He also report PND.  Of the patient does not appears to be fluid overload, labs remarkable for BUN of 92, creatinine of 2.86 which is a moderate increase from 2 years ago when it was 3.73.  No recent labs for comparison.  Current BNP is 369.6.  Hemoglobin is 9.4.  Fortunately his potassium level is within normal limits, glucose is 145.  Chest x-ray without pleural effusion or signs of infection.  Pt did mentioned taking lasix more whenever he feels he is having fluid overload.  Normally will take an extra pill on his off day.  He usually takes his Lasix on Monday/Wednesday/Friday.  Given the worsening renal function and no recent value for comparison, after discussing with Dr. Francia Greaves, plan to consult nephrology and have patient admitted for further  management of his condition.  Pt also found to be  hypertensive with BP 192/114.  This would also affect his kidney function. Troponin is negative, EKG without ischemic changes.    Concern for hypertensive emergency.  Will give Lasix.   12:05 PM Appreciate consultation from on call nephrologist Dr. Justin Mend who recommend giving pt Lasix 80mg  IV, and admit for further management. He will see pt.  I also appreciate consultation from [facility] Dr. Daleen Bo who agrees to see and admit pt.    CRITICAL CARE Performed by: Domenic Moras Total critical care time: 35 minutes Critical care time was exclusive of separately billable procedures and treating other patients. Critical care was necessary to treat or prevent imminent or life-threatening deterioration. Critical care was time spent personally by me on the following activities: development of treatment plan with patient and/or surrogate as well as nursing, discussions with consultants, evaluation of patient's response to treatment, examination of patient, obtaining history from patient or surrogate, ordering and performing treatments and interventions, ordering and review of laboratory studies, ordering and review of radiographic studies, pulse oximetry and re-evaluation of patient's condition.    Domenic Moras, PA-C 09/27/17 1223    Domenic Moras, PA-C 09/27/17 1226    Valarie Merino, MD 09/27/17 [phone]

## 2017-09-27 NOTE — H&P (Signed)
[facility] History and Physical  Hector Mahoney VWU:[id number] DOB: Oct 29, 1970 DOA: 09/27/2017  Referring physician:  PCP: Biagio Borg, MD  Specialists:   Chief Complaint: DOE  HPI: Hector Mahoney is a 47 y.o. male with PMH of DM, HTN, CHF, CKD presented with progressively worsening shortness of breath, associated with orthopnea, paroxysmal nocturnal dyspnea and dyspnea on exertion for few weeks. Patient reports similar symptoms symptoms in the past due to heart failure which usually resolves with fluid pills. He took furosemide at home for few days without much improvement and presented for further evaluation. He denies acute chest pains, no significant leg edema, no cough, no fevers, no gi symptoms. In the ED: labs showed creatinine at 8.8 previously around 3.7 (2017). ED d/w nephrology who recommended iv lasix 80 mg and admission for further work up. hospitalist is called for admission   Review of Systems: The patient denies anorexia, fever, weight loss,, vision loss, decreased hearing, hoarseness, chest pain, syncope, dyspnea on exertion, peripheral edema, balance deficits, hemoptysis, abdominal pain, melena, hematochezia, severe indigestion/heartburn, hematuria, incontinence, genital sores, muscle weakness, suspicious skin lesions, transient blindness, difficulty walking, depression, unusual weight change, abnormal bleeding, enlarged lymph nodes, angioedema, and breast masses.    Past Medical History:  Diagnosis Date  . ABSCESS, TOOTH 04/23/2009  . AKI (acute kidney injury) ([place]) 08/2015  . CHF (congestive heart failure) ([place])   . DIABETES MELLITUS, TYPE I 07/30/2007  . Esophageal reflux 04/27/2009  . FATIGUE 04/27/2009  . HYPERLIPIDEMIA 04/24/2007  . HYPERTENSION 04/24/2007  . INSOMNIA-SLEEP DISORDER-UNSPEC 04/27/2009  . LUMBAR STRAIN, ACUTE 12/11/2008  . RASH-NONVESICULAR 03/11/2008  . Shortness of breath    "lying down and w/exertion right now" (12/22/2012)   Past Surgical  History:  Procedure Laterality Date  . MOUTH SURGERY     Social History:  reports that he quit smoking about 2 years ago. His smoking use included cigarettes. he has never used smokeless tobacco. He reports that he drinks alcohol. He reports that he does not use drugs. Home;  where does patient live--home, ALF, SNF? and with whom if at home? Yes;  Can patient participate in ADLs?  No Known Allergies  Family History  Problem Relation Age of Onset  . Hypertension Mother     (be sure to complete)  Prior to Admission medications   Medication Sig Start Date End Date Taking? Authorizing Provider  amLODipine (NORVASC) 5 MG tablet Take 1 tablet (5 mg total) by mouth daily. 09/05/17  Yes Marrian Salvage, FNP  aspirin EC 81 MG EC tablet Take 1 tablet (81 mg total) by mouth daily. 09/06/15  Yes Nita Sells, MD  carvedilol (COREG) 25 MG tablet Take 1 tablet (25 mg total) by mouth 2 (two) times daily. 09/06/15  Yes Nita Sells, MD  cyclobenzaprine (FLEXERIL) 5 MG tablet Take 1 tablet (5 mg total) by mouth at bedtime. 03/29/17  Yes Biagio Borg, MD  furosemide (LASIX) 40 MG tablet Take 40 mg by mouth as directed. 1 TABLET BY MOUTH ON Monday, Wednesday, AND Friday ONLY   Yes [provider]  insulin aspart (NOVOLOG) 100 UNIT/ML injection Inject 8-12 Units into the skin 3 (three) times daily with meals. Patient taking differently: Inject 20 Units into the skin 3 (three) times daily with meals.  04/25/16  Yes Philemon Kingdom, MD  insulin glargine (LANTUS) 100 UNIT/ML injection Inject 0.4 mLs (40 Units total) into the skin at bedtime. 04/25/16  Yes Philemon Kingdom, MD  rOPINIRole (REQUIP) 0.25 MG  tablet TAKE 1 TABLET BY MOUTH AT BEDTIME 11/22/16  Yes Biagio Borg, MD  rosuvastatin (CRESTOR) 20 MG tablet TAKE 1 TABLET BY MOUTH ONCE DAILY 08/21/17  Yes Skeet Latch, MD  traMADol (ULTRAM) 50 MG tablet Take 1 tablet (50 mg total) by mouth every 8 (eight) hours as  needed. Patient taking differently: Take 50 mg by mouth every 8 (eight) hours as needed for moderate pain.  03/29/17  Yes Biagio Borg, MD  Insulin Syringe-Needle U-100 (B-D INS SYRINGE 0.5CC/31GX5/16) 31G X 5/16" 0.5 ML MISC Use 4x a day 04/25/16   Philemon Kingdom, MD  Insulin Syringe-Needle U-100 (RELION INSULIN SYRINGE) 31G X 15/64" 0.5 ML MISC Use 4 times daily. 04/26/16   Philemon Kingdom, MD  isosorbide mononitrate (IMDUR) 30 MG 24 hr tablet TAKE ONE-HALF TABLET BY MOUTH ONCE DAILY Patient not taking: Reported on 09/27/2017 10/12/16   Biagio Borg, MD   Physical Exam: Vitals:   09/27/17 0743 09/27/17 1113  BP: (!) 173/105 (!) 192/114  Pulse: 97 85  Resp: 20 20  Temp:    SpO2: 98% 100%     General:  Alert. No distress   Eyes: eomi  ENT: no oral ulcers   Neck: supple, no jvd   Cardiovascular: s1,s2 s4, rrr  Respiratory: CTA BL  Abdomen: soft, nt  Skin: no rash   Musculoskeletal: no leg edem a  Psychiatric: no hallucinations   Neurologic: CN 2-12 intact. Motor 5/5   Labs on Admission:  Basic Metabolic Panel: Recent Labs  Lab 09/27/17 0653  NA 138  K 3.8  CL 105  CO2 19*  GLUCOSE 145*  BUN 92*  CREATININE 8.86*  CALCIUM 7.5*   Liver Function Tests: Recent Labs  Lab 09/27/17 0653  AST 16  ALT 21  ALKPHOS 97  BILITOT 0.9  PROT 7.3  ALBUMIN 3.3*   No results for input(s): LIPASE, AMYLASE in the last 168 hours. No results for input(s): AMMONIA in the last 168 hours. CBC: Recent Labs  Lab 09/27/17 0653  WBC 8.0  HGB 9.4*  HCT 29.6*  MCV 74.9*  PLT 283   Cardiac Enzymes: No results for input(s): CKTOTAL, CKMB, CKMBINDEX, TROPONINI in the last 168 hours.  BNP (last 3 results) Recent Labs    09/27/17 0653  BNP 369.6*    ProBNP (last 3 results) No results for input(s): PROBNP in the last 8760 hours.  CBG: No results for input(s): GLUCAP in the last 168 hours.  Radiological Exams on Admission: Dg Chest 2 View  Result Date:  09/27/2017 CLINICAL DATA:  Short of breath EXAM: CHEST  2 VIEW COMPARISON:  09/02/2015 FINDINGS: The heart size and mediastinal contours are within normal limits. Both lungs are clear. The visualized skeletal structures are unremarkable. IMPRESSION: No active cardiopulmonary disease. Electronically Signed   By: Franchot Gallo M.D.   On: 09/27/2017 07:07    EKG: Independently reviewed.   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 male with PMH of DM, HTN, CHF, CKD presented with progressively worsening shortness of breath, associated with orthopnea, paroxysmal nocturnal dyspnea and dyspnea on exertion for few weeks.  AKI on CKD IV-V. Questionable oliguric. Mild chronic fluid retention. Suspected progressive diabetic nephropathy. Creatinine 8.6 on admission. previously 3.7 (2017), with significant drop in GFR -will try lasix for diuresis, monitor urine output, I/o, renal labs. Check vit d, pth, phosphorus, Suspected Consult  nephrology evaluation. May need dialysis planning in near future if no improvement   CHF. Chronic dHF. No recent echo. clinically no significant fluid overload. But symptomatic dyspnea. Will cont diuresis as above. Check echo, pa pressure. Not on ACE/ARB due to renal issues. Will cont BB, NTG, added hydralazine.   Dyspnea. Suspect due to progressive worsening renal failure with HF. Will cont as above. Check echocardiogram.    DM. Home regimen  humalog 20U TID+ lantus 40U. However, he reports hypoglycemic episodes, likely due to renal failure. Will resume lantus, +ISS. Check ha1c, monitor glucose, adjust as needed  Anemia. AoCD/renal. No s/s of bleeding. Check iron profile. Epo/iron as needed  HTN. Uncontrolled. Resume amlodipine, coreg, NTG, added hydralazine. titrate as needed   Nephrology;  if consultant consulted, please document name and whether formally or informally  consulted  Code Status: full (must indicate code status--if unknown or must be presumed, indicate so) Family Communication: d/w patient, ED (indicate person spoken with, if applicable, with phone number if by telephone) Disposition Plan: home 24-48 hrs  (indicate anticipated LOS)  Time spent: >45 minutes   Kinnie Feil [facility] Pager [phone]  If 7PM-7AM, please contact night-coverage [email or web address] Password [facility] 09/27/2017, 12:21 PM

## 2017-09-27 NOTE — ED Notes (Signed)
ED TO INPATIENT HANDOFF REPORT  Name/Age/Gender Hector Mahoney 47 y.o. male  Code Status    Code Status Orders  (From admission, onward)        Start     Ordered   09/27/17 1237  Full code  Continuous     09/27/17 1240    Code Status History    Date Active Date Inactive Code Status Order ID Comments User Context   05/01/2016 05:12 05/01/2016 19:48 Full Code [id number]  Etta Quill, DO ED   09/02/2015 13:08 09/06/2015 17:08 Full Code [id number]  Samella Parr, NP Inpatient   12/22/2012 08:14 12/23/2012 13:33 Full Code [id number]  Rise Patience, MD Inpatient      Home/SNF/Other Home  Chief Complaint Shortness of breath; Leg Cramps  Level of Care/Admitting Diagnosis ED Disposition    ED Disposition Condition [facility] Area: [place] [[id number]]  Level of Care: Telemetry [5]  Admit to tele based on following criteria: Complex arrhythmia (Bradycardia/Tachycardia)  Diagnosis: Renal failure [333545]  Admitting Physician: Kinnie Feil [[id number]]  Attending Physician: Kinnie Feil [[id number]]  PT Class (Do Not Modify): Observation [104]  PT Acc Code (Do Not Modify): Observation [10022]       Medical History Past Medical History:  Diagnosis Date  . ABSCESS, TOOTH 04/23/2009  . AKI (acute kidney injury) ([place]) 08/2015  . CHF (congestive heart failure) ([place])   . DIABETES MELLITUS, TYPE I 07/30/2007  . Esophageal reflux 04/27/2009  . FATIGUE 04/27/2009  . HYPERLIPIDEMIA 04/24/2007  . HYPERTENSION 04/24/2007  . INSOMNIA-SLEEP DISORDER-UNSPEC 04/27/2009  . LUMBAR STRAIN, ACUTE 12/11/2008  . RASH-NONVESICULAR 03/11/2008  . Shortness of breath    "lying down and w/exertion right now" (12/22/2012)    Allergies No Known Allergies  IV Location/Drains/Wounds Patient Lines/Drains/Airways Status   Active Line/Drains/Airways    Name:   Placement date:   Placement time:   Site:   Days:   Peripheral IV 09/27/17 Right Hand    09/27/17    1255    Hand   less than 1          Labs/Imaging Results for orders placed or performed during the hospital encounter of 09/27/17 (from the past 48 hour(s))  Basic metabolic panel     Status: Abnormal   Collection Time: 09/27/17  6:53 AM  Result Value Ref Range   Sodium 138 135 - 145 mmol/L   Potassium 3.8 3.5 - 5.1 mmol/L   Chloride 105 101 - 111 mmol/L   CO2 19 (L) 22 - 32 mmol/L   Glucose, Bld 145 (H) 65 - 99 mg/dL   BUN 92 (H) 6 - 20 mg/dL   Creatinine, Ser 8.86 (H) 0.61 - 1.24 mg/dL   Calcium 7.5 (L) 8.9 - 10.3 mg/dL   GFR calc non Af Amer 6 (L) >60 mL/min   GFR calc Af Amer 7 (L) >60 mL/min    Comment: (NOTE) The eGFR has been calculated using the CKD EPI equation. This calculation has not been validated in all clinical situations. eGFR's persistently <60 mL/min signify possible Chronic Kidney Disease.    Anion gap 14 5 - 15    Comment: Performed at [facility], [place] [address] [place], [place] [address]  CBC     Status: Abnormal   Collection Time: 09/27/17  6:53 AM  Result Value Ref Range   WBC 8.0 4.0 - 10.5 K/uL   RBC 3.95 (L) 4.22 - 5.81 MIL/uL  Hemoglobin 9.4 (L) 13.0 - 17.0 g/dL   HCT 29.6 (L) 39.0 - 52.0 %   MCV 74.9 (L) 78.0 - 100.0 fL   MCH 23.8 (L) 26.0 - 34.0 pg   MCHC 31.8 30.0 - 36.0 g/dL   RDW 16.2 (H) 11.5 - 15.5 %   Platelets 283 150 - 400 K/uL    Comment: Performed at [facility], [place] [address] [place], [place] [address]  Brain natriuretic peptide     Status: Abnormal   Collection Time: 09/27/17  6:53 AM  Result Value Ref Range   B Natriuretic Peptide 369.6 (H) 0.0 - 100.0 pg/mL    Comment: Performed at [facility], [place] [address] [place], [place] [address]  Hepatic function panel     Status: Abnormal   Collection Time: 09/27/17  6:53 AM  Result Value Ref Range   Total Protein 7.3 6.5 - 8.1 g/dL   Albumin 3.3 (L) 3.5 - 5.0 g/dL   AST 16 15 - 41 U/L   ALT 21 17 - 63  U/L   Alkaline Phosphatase 97 38 - 126 U/L   Total Bilirubin 0.9 0.3 - 1.2 mg/dL   Bilirubin, Direct 0.2 0.1 - 0.5 mg/dL   Indirect Bilirubin 0.7 0.3 - 0.9 mg/dL    Comment: Performed at [facility], [place] [address] [place], [place] [address]  Phosphorus     Status: Abnormal   Collection Time: 09/27/17  6:53 AM  Result Value Ref Range   Phosphorus 5.9 (H) 2.5 - 4.6 mg/dL    Comment: Performed at [facility], [place] [address] [place], [place] [address]  I-stat troponin, ED     Status: None   Collection Time: 09/27/17  7:15 AM  Result Value Ref Range   Troponin i, poc 0.03 0.00 - 0.08 ng/mL   Comment 3            Comment: Due to the release kinetics of cTnI, a negative result within the first hours of the onset of symptoms does not rule out myocardial infarction with certainty. If myocardial infarction is still suspected, repeat the test at appropriate intervals.    Dg Chest 2 View  Result Date: 09/27/2017 CLINICAL DATA:  Short of breath EXAM: CHEST  2 VIEW COMPARISON:  09/02/2015 FINDINGS: The heart size and mediastinal contours are within normal limits. Both lungs are clear. The visualized skeletal structures are unremarkable. IMPRESSION: No active cardiopulmonary disease. Electronically Signed   By: Franchot Gallo M.D.   On: 09/27/2017 07:07    Pending Labs Unresulted Labs (From admission, onward)   Start     Ordered   09/28/17 6629  Basic metabolic panel  Tomorrow morning,   R     09/27/17 1240   09/28/17 0500  CBC  Tomorrow morning,   R     09/27/17 1240   09/27/17 1238  Ferritin  Once,   R     09/27/17 1240   09/27/17 1238  PTH, intact and calcium  Once,   R     09/27/17 1240   09/27/17 1238  VITAMIN D 25 Hydroxy (Vit-D Deficiency, Fractures)  Once,   R     09/27/17 1240   09/27/17 1237  Iron and TIBC  Once,   R     09/27/17 1240   09/27/17 1236  HIV antibody (Routine Testing)  Once,   R     09/27/17 1240       Vitals/Pain  Today's Vitals   09/27/17 1330 09/27/17 1400 09/27/17 1430 09/27/17 1530  BP: (!) 196/118 (!) 198/113 (!) 178/103 (!) 167/104  Pulse: (!) 102 85 84 87  Resp: (!) _0 (!) 22  Temp:      TempSrc:      SpO2: 100% 95% 94% 96%  Weight:      Height:        Isolation Precautions No active isolations  Medications Medications  isosorbide mononitrate (IMDUR) 24 hr tablet 15 mg (15 mg Oral Given 09/27/17 1429)  aspirin EC tablet 81 mg (81 mg Oral Given 09/27/17 1429)  cyclobenzaprine (FLEXERIL) tablet 5 mg (not administered)  insulin glargine (LANTUS) injection 40 Units (not administered)  rOPINIRole (REQUIP) tablet 0.25 mg (not administered)  rosuvastatin (CRESTOR) tablet 5 mg (not administered)  heparin injection 5,000 Units (5,000 Units Subcutaneous Given 09/27/17 1429)  furosemide (LASIX) injection 80 mg (not administered)  insulin aspart (novoLOG) injection 0-9 Units (not administered)  hydrALAZINE (APRESOLINE) injection 10 mg (not administered)  hydrALAZINE (APRESOLINE) tablet 10 mg (10 mg Oral Given 09/27/17 1430)  furosemide (LASIX) tablet 80 mg (not administered)  labetalol (NORMODYNE) tablet 200 mg (200 mg Oral Given 09/27/17 1510)  amLODipine (NORVASC) tablet 10 mg (10 mg Oral Given 09/27/17 1510)  furosemide (LASIX) injection 80 mg (80 mg Intravenous Given 09/27/17 1255)    Mobility walks

## 2017-09-27 NOTE — Consult Note (Signed)
Referring Provider: No ref. provider found Primary Care Physician:  Biagio Borg, MD Primary Nephrologist:  Dr. Marval Regal  Reason for Consultation:   Chronic renal failure  Diabetes and diabetic nephropathy  Stage 5 chronic disease  Anemia   HPI:  47 y.o. male with PMH of DM, HTN, CHF, CKD ED: labs showed creatinine at 8.8 previously around 3.7 (2017).  He was somewhat volume overloaded and short of breath - CXR was consistent with pulmonary edema  In ER  His blood pressure was fairly increased  He states that he has not taken any NSAIDS or COX 2 inhibitors and no ACE or ARB Hie has diabetes that he states has not been under very good control He denies any difficulty with urination  We shall start on IV lasix overnight and try to improve BP control I see no urgent need to dialysis although I think that he needs kidney options and consideration of home dialysis  Past Medical History:  Diagnosis Date  . ABSCESS, TOOTH 04/23/2009  . AKI (acute kidney injury) ([place]) 08/2015  . CHF (congestive heart failure) ([place])   . DIABETES MELLITUS, TYPE I 07/30/2007  . Esophageal reflux 04/27/2009  . FATIGUE 04/27/2009  . HYPERLIPIDEMIA 04/24/2007  . HYPERTENSION 04/24/2007  . INSOMNIA-SLEEP DISORDER-UNSPEC 04/27/2009  . LUMBAR STRAIN, ACUTE 12/11/2008  . RASH-NONVESICULAR 03/11/2008  . Shortness of breath    "lying down and Mahoney/exertion right now" (12/22/2012)    Past Surgical History:  Procedure Laterality Date  . MOUTH SURGERY      Prior to Admission medications   Medication Sig Start Date End Date Taking? Authorizing Provider  amLODipine (NORVASC) 5 MG tablet Take 1 tablet (5 mg total) by mouth daily. 09/05/17  Yes Marrian Salvage, FNP  aspirin EC 81 MG EC tablet Take 1 tablet (81 mg total) by mouth daily. 09/06/15  Yes Nita Sells, MD  carvedilol (COREG) 25 MG tablet Take 1 tablet (25 mg total) by mouth 2 (two) times daily. 09/06/15  Yes Nita Sells, MD  cyclobenzaprine  (FLEXERIL) 5 MG tablet Take 1 tablet (5 mg total) by mouth at bedtime. 03/29/17  Yes Biagio Borg, MD  furosemide (LASIX) 40 MG tablet Take 40 mg by mouth as directed. 1 TABLET BY MOUTH ON Monday, Wednesday, AND Friday ONLY   Yes [provider]  insulin aspart (NOVOLOG) 100 UNIT/ML injection Inject 8-12 Units into the skin 3 (three) times daily with meals. Patient taking differently: Inject 20 Units into the skin 3 (three) times daily with meals.  04/25/16  Yes Philemon Kingdom, MD  insulin glargine (LANTUS) 100 UNIT/ML injection Inject 0.4 mLs (40 Units total) into the skin at bedtime. 04/25/16  Yes Philemon Kingdom, MD  rOPINIRole (REQUIP) 0.25 MG tablet TAKE 1 TABLET BY MOUTH AT BEDTIME 11/22/16  Yes Biagio Borg, MD  rosuvastatin (CRESTOR) 20 MG tablet TAKE 1 TABLET BY MOUTH ONCE DAILY 08/21/17  Yes Skeet Latch, MD  traMADol (ULTRAM) 50 MG tablet Take 1 tablet (50 mg total) by mouth every 8 (eight) hours as needed. Patient taking differently: Take 50 mg by mouth every 8 (eight) hours as needed for moderate pain.  03/29/17  Yes Biagio Borg, MD  Insulin Syringe-Needle U-100 (B-D INS SYRINGE 0.5CC/31GX5/16) 31G X 5/16" 0.5 ML MISC Use 4x a day 04/25/16   Philemon Kingdom, MD  Insulin Syringe-Needle U-100 (RELION INSULIN SYRINGE) 31G X 15/64" 0.5 ML MISC Use 4 times daily. 04/26/16   Philemon Kingdom, MD  isosorbide mononitrate (IMDUR)  30 MG 24 hr tablet TAKE ONE-HALF TABLET BY MOUTH ONCE DAILY Patient not taking: Reported on 09/27/2017 10/12/16   Biagio Borg, MD    Current Facility-Administered Medications  Medication Dose Route Frequency Provider Last Rate Last Dose  . amLODipine (NORVASC) tablet 5 mg  5 mg Oral Daily Buriev, Arie Sabina, MD      . aspirin EC tablet 81 mg  81 mg Oral Daily Buriev, Arie Sabina, MD      . carvedilol (COREG) tablet 25 mg  25 mg Oral BID WC Buriev, Arie Sabina, MD      . cyclobenzaprine (FLEXERIL) tablet 5 mg  5 mg Oral QHS Buriev, Arie Sabina, MD      .  Derrill Memo ON 09/28/2017] furosemide (LASIX) injection 80 mg  80 mg Intravenous Daily Buriev, Arie Sabina, MD      . heparin injection 5,000 Units  5,000 Units Subcutaneous BID Buriev, Arie Sabina, MD      . hydrALAZINE (APRESOLINE) injection 10 mg  10 mg Intravenous Q8H PRN Buriev, Arie Sabina, MD      . hydrALAZINE (APRESOLINE) tablet 10 mg  10 mg Oral TID Buriev, Arie Sabina, MD      . insulin aspart (novoLOG) injection 0-9 Units  0-9 Units Subcutaneous TID WC Buriev, Ulugbek N, MD      . insulin glargine (LANTUS) injection 40 Units  40 Units Subcutaneous QHS Buriev, Arie Sabina, MD      . isosorbide mononitrate (IMDUR) 24 hr tablet 15 mg  15 mg Oral Daily Buriev, Ulugbek N, MD      . rOPINIRole (REQUIP) tablet 0.25 mg  0.25 mg Oral QHS Buriev, Ulugbek N, MD      . rosuvastatin (CRESTOR) tablet 5 mg  5 mg Oral q1800 Kinnie Feil, MD       Current Outpatient Medications  Medication Sig Dispense Refill  . amLODipine (NORVASC) 5 MG tablet Take 1 tablet (5 mg total) by mouth daily. 30 tablet 1  . aspirin EC 81 MG EC tablet Take 1 tablet (81 mg total) by mouth daily. 30 tablet 0  . carvedilol (COREG) 25 MG tablet Take 1 tablet (25 mg total) by mouth 2 (two) times daily. 60 tablet 11  . cyclobenzaprine (FLEXERIL) 5 MG tablet Take 1 tablet (5 mg total) by mouth at bedtime. 90 tablet 1  . furosemide (LASIX) 40 MG tablet Take 40 mg by mouth as directed. 1 TABLET BY MOUTH ON Monday, Wednesday, AND Friday ONLY    . insulin aspart (NOVOLOG) 100 UNIT/ML injection Inject 8-12 Units into the skin 3 (three) times daily with meals. (Patient taking differently: Inject 20 Units into the skin 3 (three) times daily with meals. ) 2 vial 5  . insulin glargine (LANTUS) 100 UNIT/ML injection Inject 0.4 mLs (40 Units total) into the skin at bedtime. 20 mL 5  . rOPINIRole (REQUIP) 0.25 MG tablet TAKE 1 TABLET BY MOUTH AT BEDTIME 90 tablet 3  . rosuvastatin (CRESTOR) 20 MG tablet TAKE 1 TABLET BY MOUTH ONCE DAILY 30 tablet 3  .  traMADol (ULTRAM) 50 MG tablet Take 1 tablet (50 mg total) by mouth every 8 (eight) hours as needed. (Patient taking differently: Take 50 mg by mouth every 8 (eight) hours as needed for moderate pain. ) 60 tablet 0  . Insulin Syringe-Needle U-100 (B-D INS SYRINGE 0.5CC/31GX5/16) 31G X 5/16" 0.5 ML MISC Use 4x a day 300 each 5  . Insulin Syringe-Needle U-100 (RELION INSULIN SYRINGE) 31G X 15/64"  0.5 ML MISC Use 4 times daily. 300 each 5  . isosorbide mononitrate (IMDUR) 30 MG 24 hr tablet TAKE ONE-HALF TABLET BY MOUTH ONCE DAILY (Patient not taking: Reported on 09/27/2017) 90 tablet 1    Allergies as of 09/27/2017  . (No Known Allergies)    Family History  Problem Relation Age of Onset  . Hypertension Mother     Social History   Socioeconomic History  . Marital status: Married    Spouse name: Not on file  . Number of children: Not on file  . Years of education: Not on file  . Highest education level: Not on file  Social Needs  . Financial resource strain: Not on file  . Food insecurity - worry: Not on file  . Food insecurity - inability: Not on file  . Transportation needs - medical: Not on file  . Transportation needs - non-medical: Not on file  Occupational History  . Not on file  Tobacco Use  . Smoking status: Former Smoker    Types: Cigarettes    Last attempt to quit: 05/03/2015    Years since quitting: 2.4  . Smokeless tobacco: Never Used  . Tobacco comment: 12/22/2012 "used to smoke cigarettes once or twice/month; haven't had any cigarettes for years"  Substance and Sexual Activity  . Alcohol use: Yes    Comment: occ  . Drug use: No  . Sexual activity: Yes  Other Topics Concern  . Not on file  Social History Narrative  . Not on file    Review of Systems: Gen: Denies any fever, chills, sweats, anorexia, fatigue, weakness, malaise, weight loss, and sleep disorder HEENT: No visual complaints, No history of Retinopathy. Normal external appearance No Epistaxis or Sore  throat. No sinusitis.   CV: Denies chest pain, angina, palpitations, syncope, orthopnea, PND, peripheral edema, and claudication. Resp: Denies dyspnea at rest, dyspnea with exercise, cough, sputum, wheezing, coughing up blood, and pleurisy. GI: Denies vomiting blood, jaundice, and fecal incontinence.   Denies dysphagia or odynophagia. GU : Denies urinary burning, blood in urine, urinary frequency, urinary hesitancy, nocturnal urination, and urinary incontinence.  No renal calculi. MS: Denies joint pain, limitation of movement, and swelling, stiffness, low back pain, extremity pain. Denies muscle weakness, cramps, atrophy.  No use of non steroidal antiinflammatory drugs. Derm: Denies rash, itching, dry skin, hives, moles, warts, or unhealing ulcers.  Psych: Denies depression, anxiety, memory loss, suicidal ideation, hallucinations, paranoia, and confusion. Heme: Denies bruising, bleeding, and enlarged lymph nodes. Neuro: No headache.  No diplopia. No dysarthria.  No dysphasia.  No history of CVA.  No Seizures. No paresthesias.  No weakness. Endocrine   DM.  No Thyroid disease.  No Adrenal disease.  Physical Exam: Vital signs in last 24 hours: Temp:  [98.1 F (36.7 C)] 98.1 F (36.7 C) (02/15 0617) Pulse Rate:  [85-97] 89 (02/15 1300) Resp:  [17-20] 18 (02/15 1300) BP: (173-201)/(104-119) 201/119 (02/15 1300) SpO2:  [98 %-100 %] 98 % (02/15 1300) Weight:  [207 lb (93.9 kg)] 207 lb (93.9 kg) (02/15 0626)   General:   Alert,  Well-developed, well-nourished, pleasant and cooperative in NAD Head:  Normocephalic and atraumatic. Eyes:  Sclera clear, no icterus.   Conjunctiva pink. Ears:  Normal auditory acuity. Nose:  No deformity, discharge,  or lesions. Mouth:  No deformity or lesions, dentition normal. Neck:  Supple; no masses or thyromegaly. JVP not elevated Lungs:  Clear throughout to auscultation.   No wheezes, crackles, or rhonchi. No acute distress.  Heart:  Regular rate and rhythm; no  murmurs, clicks, rubs,  or gallops. Abdomen:  Soft, nontender and nondistended. No masses, hepatosplenomegaly or hernias noted. Normal bowel sounds, without guarding, and without rebound.   Msk:  Symmetrical without gross deformities. Normal posture. Pulses:  No carotid, renal, femoral bruits. DP and PT symmetrical and equal Extremities:  Without clubbing or edema. Neurologic:  Alert and  oriented x4;  grossly normal neurologically. Skin:  Intact without significant lesions or rashes. Cervical Nodes:  No significant cervical adenopathy. Psych:  Alert and cooperative. Normal mood and affect.  Intake/Output from previous day: No intake/output data recorded. Intake/Output this shift: No intake/output data recorded.  Lab Results: Recent Labs    09/27/17 0653  WBC 8.0  HGB 9.4*  HCT 29.6*  PLT 283   BMET Recent Labs    09/27/17 0653  NA 138  K 3.8  CL 105  CO2 19*  GLUCOSE 145*  BUN 92*  CREATININE 8.86*  CALCIUM 7.5*   LFT Recent Labs    09/27/17 0653  PROT 7.3  ALBUMIN 3.3*  AST 16  ALT 21  ALKPHOS 97  BILITOT 0.9  BILIDIR 0.2  IBILI 0.7   PT/INR No results for input(s): LABPROT, INR in the last 72 hours. Hepatitis Panel No results for input(s): HEPBSAG, HCVAB, HEPAIGM, HEPBIGM in the last 72 hours.  Studies/Results: Dg Chest 2 View  Result Date: 09/27/2017 CLINICAL DATA:  Short of breath EXAM: CHEST  2 VIEW COMPARISON:  09/02/2015 FINDINGS: The heart size and mediastinal contours are within normal limits. Both lungs are clear. The visualized skeletal structures are unremarkable. IMPRESSION: No active cardiopulmonary disease. Electronically Signed   By: Franchot Gallo M.D.   On: 09/27/2017 07:07    Assessment/Plan:  Chronic renal disease stage   Kidney options and will check vein mapping   Check renal ultrasound and urinalysis. Stage 5. Will make outpatient appointment with Dr Marval Regal  Hypertension will switch coreg to labetolol  Anemia  stable  Bones check PTH    LOS: 0 Hector Mahoney @TODAY @1 :46 PM

## 2017-09-27 NOTE — Progress Notes (Signed)
Bilateral upper extremity vein mapping  Right  Upper Extremity  Cephalic  Segment Diameter Depth Comment  1. Axilla 1.4 mm 7.3 mm   2. Mid upper arm 1.8 mm 2.1 mm   3. Above AC 2.3 mm 2. mm   4. In AC 2.8 mm 5.8 mm   5. Below AC 1.9 mm 6.4 mm   6. Mid forearm 1.8 mm 4 mm   7. Wrist 1.6 mm 2.8 mm    Basilic  Segment Diameter Depth Comment  1. Axilla 3.2 mm 10 mm   2. Mid upper arm 3.1 mm 8.1 mm Branch  3. Above AC 2.1 mm 7.9 mm   4. In AC 1.9 mm 5.4 mm   5. Below AC 1.1 mm 2.8 mm   6. Mid forearm mm mm No visualization  7. Wrist mm mm No visualization   Left Upper Extremity  Cephalic  Segment Diameter Depth Comment  1. Axilla 2.2 mm 16 mm   2. Mid upper arm 2.2 mm 5.6 mm   3. Above AC 0.3 mm 4.5 mm   4. In AC 2.8 mm 5.4 mm   5. Below AC 1.4 mm 2.3 mm Branch  6. Mid forearm 1.6 mm 2.3 mm   7. Wrist 1.2 mm 2.4 mm    Basilic  Segment Diameter Depth Comment  1. Axilla 2.6 mm 15.1 mm   2. Mid upper arm 2.7 mm 8.7 mm   3. Above AC 2.4 mm 6.1 mm Branch  4. In AC 1.7 mm 4.8 mm Branch  5. Below AC 1.7 mm 2.4 mm   6. Mid forearm 0.1 mm 2 mm   7. Wrist 0.1 mm 1.8 mm    09/27/17 3:40 PM Carlos Levering RVT

## 2017-09-28 DIAGNOSIS — N185 Chronic kidney disease, stage 5: Secondary | ICD-10-CM

## 2017-09-28 DIAGNOSIS — E1022 Type 1 diabetes mellitus with diabetic chronic kidney disease: Secondary | ICD-10-CM | POA: Diagnosis not present

## 2017-09-28 DIAGNOSIS — I161 Hypertensive emergency: Secondary | ICD-10-CM

## 2017-09-28 DIAGNOSIS — R0602 Shortness of breath: Secondary | ICD-10-CM

## 2017-09-28 DIAGNOSIS — N19 Unspecified kidney failure: Secondary | ICD-10-CM

## 2017-09-28 DIAGNOSIS — I120 Hypertensive chronic kidney disease with stage 5 chronic kidney disease or end stage renal disease: Secondary | ICD-10-CM | POA: Diagnosis not present

## 2017-09-28 DIAGNOSIS — D631 Anemia in chronic kidney disease: Secondary | ICD-10-CM | POA: Diagnosis not present

## 2017-09-28 LAB — ECHOCARDIOGRAM COMPLETE
Height: 66 in
Weight: 3312 oz

## 2017-09-28 LAB — BASIC METABOLIC PANEL
ANION GAP: 12 (ref 5–15)
BUN: 94 mg/dL — ABNORMAL HIGH (ref 6–20)
CALCIUM: 7.2 mg/dL — AB (ref 8.9–10.3)
CO2: 21 mmol/L — ABNORMAL LOW (ref 22–32)
Chloride: 106 mmol/L (ref 101–111)
Creatinine, Ser: 9.23 mg/dL — ABNORMAL HIGH (ref 0.61–1.24)
GFR, EST AFRICAN AMERICAN: 7 mL/min — AB (ref >60)
GFR, EST NON AFRICAN AMERICAN: 6 mL/min — AB (ref >60)
Glucose, Bld: 211 mg/dL — ABNORMAL HIGH (ref 65–99)
POTASSIUM: 3.8 mmol/L (ref 3.5–5.1)
SODIUM: 139 mmol/L (ref 135–145)

## 2017-09-28 LAB — CBC
HEMATOCRIT: 27.3 % — AB (ref 39.0–52.0)
HEMOGLOBIN: 8.6 g/dL — AB (ref 13.0–17.0)
MCH: 23.6 pg — ABNORMAL LOW (ref 26.0–34.0)
MCHC: 31.5 g/dL (ref 30.0–36.0)
MCV: 75 fL — ABNORMAL LOW (ref 78.0–100.0)
Platelets: 278 10*3/uL (ref 150–400)
RBC: 3.64 MIL/uL — AB (ref 4.22–5.81)
RDW: 16.1 % — ABNORMAL HIGH (ref 11.5–15.5)
WBC: 6.2 10*3/uL (ref 4.0–10.5)

## 2017-09-28 LAB — GLUCOSE, CAPILLARY
GLUCOSE-CAPILLARY: 158 mg/dL — AB (ref 65–99)
Glucose-Capillary: 236 mg/dL — ABNORMAL HIGH (ref 65–99)

## 2017-09-28 LAB — PTH, INTACT AND CALCIUM
Calcium, Total (PTH): 7.5 mg/dL — ABNORMAL LOW (ref 8.7–10.2)
PTH: 425 pg/mL — ABNORMAL HIGH (ref 15–65)

## 2017-09-28 LAB — HIV ANTIBODY (ROUTINE TESTING W REFLEX): HIV SCREEN 4TH GENERATION: NONREACTIVE

## 2017-09-28 LAB — VITAMIN D 25 HYDROXY (VIT D DEFICIENCY, FRACTURES): VIT D 25 HYDROXY: 6.1 ng/mL — AB (ref 30.0–100.0)

## 2017-09-28 MED ORDER — FERROUS SULFATE 325 (65 FE) MG PO TABS: 325.0000 mg | ORAL_TABLET | Freq: Two times a day (BID) | ORAL | 0 refills | Status: DC

## 2017-09-28 MED ORDER — FUROSEMIDE 40 MG PO TABS: 80.0000 mg | ORAL_TABLET | Freq: Every day | ORAL | Status: DC

## 2017-09-28 MED ORDER — CALCITRIOL 0.25 MCG PO CAPS: 0.2500 ug | ORAL_CAPSULE | ORAL | 0 refills | Status: DC

## 2017-09-28 MED ORDER — HYDRALAZINE HCL 25 MG PO TABS: 25.0000 mg | ORAL_TABLET | Freq: Three times a day (TID) | ORAL | 0 refills | Status: DC

## 2017-09-28 MED ORDER — LABETALOL HCL 200 MG PO TABS: 200.0000 mg | ORAL_TABLET | Freq: Two times a day (BID) | ORAL | 0 refills | Status: DC

## 2017-09-28 MED ORDER — FUROSEMIDE 80 MG PO TABS: 80.0000 mg | ORAL_TABLET | Freq: Every day | ORAL | 0 refills | Status: DC

## 2017-09-28 NOTE — Discharge Instructions (Signed)
Follow with Primary MD Biagio Borg, MD in 7 days   Get CBC, CMP, 2 view Chest X ray checked  by Primary MD  in 5-7 days   Activity: As tolerated with Full fall precautions use walker/cane & assistance as needed  Disposition Home    Diet:   Renal - Low Carb, 1.5 lit/day Fluid restriction  Accuchecks 4 times/day, Once in AM empty stomach and then before each meal. Log in all results and show them to your Prim.MD in 3 days. If any glucose reading is under 80 or above 300 call your Prim MD immidiately. Follow Low glucose instructions for glucose under 80 as instructed.   Special Instructions: If you have smoked or chewed Tobacco  in the last 2 yrs please stop smoking, stop any regular Alcohol  and or any Recreational drug use.  On your next visit with your primary care physician please Get Medicines reviewed and adjusted.  Please request your Prim.MD to go over all Hospital Tests and Procedure/Radiological results at the follow up, please get all Hospital records sent to your Prim MD by signing hospital release before you go home.  If you experience worsening of your admission symptoms, develop shortness of breath, life threatening emergency, suicidal or homicidal thoughts you must seek medical attention immediately by calling 911 or calling your MD immediately  if symptoms less severe.  You Must read complete instructions/literature along with all the possible adverse reactions/side effects for all the Medicines you take and that have been prescribed to you. Take any new Medicines after you have completely understood and accpet all the possible adverse reactions/side effects.   Do not drive, operate heavy machinery, perform activities at heights, swimming or participation in water activities or provide baby sitting services if your were admitted for syncope or siezures until you have seen by Primary MD or a Neurologist and advised to do so again.  Do not drive when taking Pain medications.     Do not take more than prescribed Pain, Sleep and Anxiety Medications  Wear Seat belts while driving.   Please note  You were cared for by a hospitalist during your hospital stay. If you have any questions about your discharge medications or the care you received while you were in the hospital after you are discharged, you can call the unit and asked to speak with the hospitalist on call if the hospitalist that took care of you is not available. Once you are discharged, your primary care physician will handle any further medical issues. Please note that NO REFILLS for any discharge medications will be authorized once you are discharged, as it is imperative that you return to your primary care physician (or establish a relationship with a primary care physician if you do not have one) for your aftercare needs so that they can reassess your need for medications and monitor your lab values.

## 2017-09-28 NOTE — Progress Notes (Signed)
[facility] ROUNDING NOTE   Subjective:   47 y.o.malewith PMH of DM, HTN, CHF, CKD ED: labs showed creatinine at 8.8 previously around 3.7 (2017).  He was somewhat volume overloaded and short of breath - CXR was consistent with pulmonary edema   Blood pressure improved   Will transition to oral lasix  I see no urgent need to dialysis    Will set up with home training and VVS appointment  Will need to see Dr Marval Regal in clinic    Objective:  Vital signs in last 24 hours:  Temp:  [98 F (36.7 C)-98.2 F (36.8 C)] 98.2 F (36.8 C) (02/16 0819) Pulse Rate:  [80-102] 80 (02/16 0819) Resp:  [16-22] 18 (02/16 0819) BP: (121-201)/(71-119) 123/71 (02/16 0819) SpO2:  [94 %-100 %] 99 % (02/16 0819)  Weight change:  Filed Weights   09/27/17 0626  Weight: 207 lb (93.9 kg)    Intake/Output: I/O last 3 completed shifts: In: -  Out: 900 [Urine:900]   Intake/Output this shift:  Total I/O In: 360 [P.O.:360] Out: -   CVS- RRR RS- CTA ABD- BS present soft non-distended EXT- no edema   Basic Metabolic Panel: Recent Labs  Lab 09/27/17 0653 09/27/17 1449 09/28/17 0543  NA 138  --  139  K 3.8  --  3.8  CL 105  --  106  CO2 19*  --  21*  GLUCOSE 145*  --  211*  BUN 92*  --  94*  CREATININE 8.86*  --  9.23*  CALCIUM 7.5* 7.5* 7.2*  PHOS 5.9*  --   --     Liver Function Tests: Recent Labs  Lab 09/27/17 0653  AST 16  ALT 21  ALKPHOS 97  BILITOT 0.9  PROT 7.3  ALBUMIN 3.3*   No results for input(s): LIPASE, AMYLASE in the last 168 hours. No results for input(s): AMMONIA in the last 168 hours.  CBC: Recent Labs  Lab 09/27/17 0653 09/28/17 0543  WBC 8.0 6.2  HGB 9.4* 8.6*  HCT 29.6* 27.3*  MCV 74.9* 75.0*  PLT 283 278    Cardiac Enzymes: No results for input(s): CKTOTAL, CKMB, CKMBINDEX, TROPONINI in the last 168 hours.  BNP: Invalid input(s): POCBNP  CBG: Recent Labs  Lab 09/27/17 1813 09/27/17 2019 09/28/17 0742  GLUCAP 199*  241* 158*    Microbiology: No results found for this or any previous visit.  Coagulation Studies: No results for input(s): LABPROT, INR in the last 72 hours.  Urinalysis: No results for input(s): COLORURINE, LABSPEC, PHURINE, GLUCOSEU, HGBUR, BILIRUBINUR, KETONESUR, PROTEINUR, UROBILINOGEN, NITRITE, LEUKOCYTESUR in the last 72 hours.  Invalid input(s): APPERANCEUR    Imaging:    Medications:    . amLODipine  10 mg Oral Daily  . aspirin EC  81 mg Oral Daily  . cyclobenzaprine  5 mg Oral QHS  . furosemide  80 mg Intravenous Daily  . heparin  5,000 Units Subcutaneous BID  . hydrALAZINE  10 mg Oral TID  . insulin aspart  0-9 Units Subcutaneous TID WC  . insulin glargine  40 Units Subcutaneous QHS  . isosorbide mononitrate  15 mg Oral Daily  . labetalol  200 mg Oral BID  . rOPINIRole  0.25 mg Oral QHS  . rosuvastatin  5 mg Oral q1800   hydrALAZINE  Assessment/ Plan:   Chronic renal disease stage    vein mapping    Renal ultrasound negative   Stage 5. Will make outpatient appointment with Dr Marval Regal  Appointment VVS and Home Training  Hypertension controlled on new regimen   Continue lasix 80mg  daily  Anemia stable   Iron study 12 %  Start oral iron   Bones    PTH  425   Rocaltrol 0.55mcg MWF      LOS: 0 Marien Manship W @TODAY @9 :52 AM

## 2017-09-28 NOTE — Discharge Summary (Signed)
Hector Mahoney:[id number] DOB: 1971-04-02 DOA: 09/27/2017  PCP: Biagio Borg, MD  Admit date: 09/27/2017  Discharge date: 09/28/2017  Admitted From: Home   Disposition:  Home   Recommendations for Outpatient Follow-up:   Follow up with PCP in 1-2 weeks  PCP Please obtain BMP/CBC, 2 view CXR in 1week,  (see Discharge instructions)   PCP Please follow up on the following pending results: None   Home Health: None Equipment/Devices: None Consultations: Nephrology Discharge Condition: Fair CODE STATUS: Full Diet Recommendation: Renal and low-carb diet, 1.5 L/day total fluid restriction   Chief Complaint  Patient presents with  . Shortness of Breath  . leg cramps     Brief history of present illness from the day of admission and additional interim summary    Hector Mahoney is a 47 y.o. male with PMH of DM, HTN, CHF, CKD presented with progressively worsening shortness of breath, associated with orthopnea, paroxysmal nocturnal dyspnea and dyspnea on exertion for few weeks. Patient reports similar symptoms symptoms in the past due to heart failure which usually resolves with fluid pills. He took furosemide at home for few days without much improvement and presented for further evaluation. He denies acute chest pains, no significant leg edema, no cough, no fevers, no gi symptoms. In the ED: labs showed creatinine at 8.8 previously around 3.7 (2017). ED d/w nephrology who recommended iv lasix 80 mg and admission for further work up. hospitalist is called for admission                                                                    Hospital Course      AKI on CKD V.  ESRD now, seen by nephrology, improved with Lasix, currently not uremic, no signs suggestive of urgent dialysis, he underwent vein  mapping, plan is to place him on oral Lasix higher than home dose, have him follow with nephrology outpatient, case discussed with Dr. Justin Mend.  He will be discharged.  Outpatient vascular surgery follow-up for fistula placement, likely to be started on PD initially at home.  Placed on oral iron and calcitriol as well as per renal recommendation.  Acute on chronic diastolic CHF EF 58%  - mild hypoxia and shortness of breath.  Due to ARF as above, resolved after IV Lasix, home medications and blood pressure medications adjusted, close to baseline no shortness of breath now.  Stable on room air.  Echocardiogram as below.    DM 2.  Noncompliant, counseled on compliance, home regimen continued upon discharge.  Anemia. AoCD/renal.  Mild element of iron deficiency as well, no signs of acute blood loss or requirement for transfusion however placed on oral iron upon discharge.  HTN. Uncontrolled.  Patient noncompliant with medications, his Coreg was switched to labetalol,  he was asked to continue his home dose Norvasc, low-dose hydralazine was added with good effect.  Blood pressure stable now.     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)   Renal failure   Hypertensive emergency    Discharge instructions    Discharge Instructions    Discharge instructions   Complete by:  As directed    Follow with Primary MD Biagio Borg, MD in 7 days   Get CBC, CMP, 2 view Chest X ray checked  by Primary MD  in 5-7 days   Activity: As tolerated with Full fall precautions use walker/cane & assistance as needed  Disposition Home    Diet:   Renal - Low Carb, 1.5 lit/day Fluid restriction  Accuchecks 4 times/day, Once in AM empty stomach and then before each meal. Log in all results and show them to your Prim.MD in 3 days. If any glucose reading is under 80 or above 300 call your Prim MD immidiately. Follow Low  glucose instructions for glucose under 80 as instructed.   Special Instructions: If you have smoked or chewed Tobacco  in the last 2 yrs please stop smoking, stop any regular Alcohol  and or any Recreational drug use.  On your next visit with your primary care physician please Get Medicines reviewed and adjusted.  Please request your Prim.MD to go over all Hospital Tests and Procedure/Radiological results at the follow up, please get all Hospital records sent to your Prim MD by signing hospital release before you go home.  If you experience worsening of your admission symptoms, develop shortness of breath, life threatening emergency, suicidal or homicidal thoughts you must seek medical attention immediately by calling 911 or calling your MD immediately  if symptoms less severe.  You Must read complete instructions/literature along with all the possible adverse reactions/side effects for all the Medicines you take and that have been prescribed to you. Take any new Medicines after you have completely understood and accpet all the possible adverse reactions/side effects.   Do not drive, operate heavy machinery, perform activities at heights, swimming or participation in water activities or provide baby sitting services if your were admitted for syncope or siezures until you have seen by Primary MD or a Neurologist and advised to do so again.  Do not drive when taking Pain medications.    Do not take more than prescribed Pain, Sleep and Anxiety Medications  Wear Seat belts while driving.   Please note  You were cared for by a hospitalist during your hospital stay. If you have any questions about your discharge medications or the care you received while you were in the hospital after you are discharged, you can call the unit and asked to speak with the hospitalist on call if the hospitalist that took care of you is not available. Once you are discharged, your primary care physician will handle  any further medical issues. Please note that NO REFILLS for any discharge medications will be authorized once you are discharged, as it is imperative that you return to your primary care physician (or establish a relationship with a primary care physician if you do not have one) for your aftercare needs so that they can reassess your need for medications and monitor your lab values.   Increase activity slowly   Complete by:  As directed  Discharge Medications   Allergies as of 09/28/2017   No Known Allergies     Medication List    STOP taking these medications   carvedilol 25 MG tablet Commonly known as:  COREG     TAKE these medications   amLODipine 5 MG tablet Commonly known as:  NORVASC Take 1 tablet (5 mg total) by mouth daily.   aspirin 81 MG EC tablet Take 1 tablet (81 mg total) by mouth daily.   calcitRIOL 0.25 MCG capsule Commonly known as:  ROCALTROL Take 1 capsule (0.25 mcg total) by mouth every Monday, Wednesday, and Friday at 6 PM. Start taking on:  09/30/2017   cyclobenzaprine 5 MG tablet Commonly known as:  FLEXERIL Take 1 tablet (5 mg total) by mouth at bedtime.   ferrous sulfate 325 (65 FE) MG tablet Take 1 tablet (325 mg total) by mouth 2 (two) times daily with a meal.   furosemide 80 MG tablet Commonly known as:  LASIX Take 1 tablet (80 mg total) by mouth daily. Start taking on:  09/29/2017 What changed:    medication strength  how much to take  when to take this  additional instructions   hydrALAZINE 25 MG tablet Commonly known as:  APRESOLINE Take 1 tablet (25 mg total) by mouth 3 (three) times daily.   insulin aspart 100 UNIT/ML injection Commonly known as:  novoLOG Inject 8-12 Units into the skin 3 (three) times daily with meals. What changed:  how much to take   insulin glargine 100 UNIT/ML injection Commonly known as:  LANTUS Inject 0.4 mLs (40 Units total) into the skin at bedtime.   Insulin Syringe-Needle U-100 31G X 15/64"  0.5 ML Misc Commonly known as:  RELION INSULIN SYRINGE Use 4 times daily. What changed:  Another medication with the same name was removed. Continue taking this medication, and follow the directions you see here.   isosorbide mononitrate 30 MG 24 hr tablet Commonly known as:  IMDUR TAKE ONE-HALF TABLET BY MOUTH ONCE DAILY   labetalol 200 MG tablet Commonly known as:  NORMODYNE Take 1 tablet (200 mg total) by mouth 2 (two) times daily.   rOPINIRole 0.25 MG tablet Commonly known as:  REQUIP TAKE 1 TABLET BY MOUTH AT BEDTIME   rosuvastatin 20 MG tablet Commonly known as:  CRESTOR TAKE 1 TABLET BY MOUTH ONCE DAILY   traMADol 50 MG tablet Commonly known as:  ULTRAM Take 1 tablet (50 mg total) by mouth every 8 (eight) hours as needed. What changed:  reasons to take this       Follow-up Information    Biagio Borg, MD. Schedule an appointment as soon as possible for a visit in 1 week(s).   Specialties:  Internal Medicine, Radiology Contact information: [place] [address] [phone]        Donato Heinz, MD. Schedule an appointment as soon as possible for a visit in 3 day(s).   Specialty:  Nephrology Contact information: [place] [address] [phone]           Major procedures and Radiology Reports - PLEASE review detailed and final reports thoroughly  -     TTE  - Compared to a prior study in 2017, the LVEF is higher at 60-65%  with grade 2 DD and elevated LV filling pressure and severe LVH.   Dg Chest 2 View  Result Date: 09/27/2017 CLINICAL DATA:  Short of breath EXAM: CHEST  2 VIEW COMPARISON:  09/02/2015 FINDINGS: The heart size and mediastinal contours are within normal limits. Both lungs are clear. The visualized skeletal structures are unremarkable. IMPRESSION: No active cardiopulmonary disease. Electronically Signed   By: Franchot Gallo M.D.   On: 09/27/2017 07:07   Vas [place] Upper Ext Vein Mapping (pre-op  Avf)  Result Date: 09/28/2017 UPPER EXTREMITY VEIN MAPPING History: Pre-access. Examination Guidelines: A complete evaluation includes B-mode imaging, spectral doppler, color doppler, and power doppler as needed of all accessible portions of each vessel. Bilateral testing is considered an integral part of a complete examination. Limited examinations for reoccurring indications may be performed as noted. +-----------------+-------------+----------+--------+ Right Cephalic   Diameter (mm)Depth (mm)Findings +-----------------+-------------+----------+--------+ Shoulder             1.40        7.30            +-----------------+-------------+----------+--------+ Prox upper arm       1.60        5.00            +-----------------+-------------+----------+--------+ Mid upper arm        1.80        2.10            +-----------------+-------------+----------+--------+ Dist upper arm       2.30        2.30            +-----------------+-------------+----------+--------+ Antecubital fossa    2.80        5.80            +-----------------+-------------+----------+--------+ Prox forearm         1.90        6.40            +-----------------+-------------+----------+--------+ Mid forearm          1.80        4.00            +-----------------+-------------+----------+--------+ Dist forearm         1.60        2.80            +-----------------+-------------+----------+--------+ +-----------------+-------------+----------+--------------+ Right Basilic    Diameter (mm)Depth (mm)   Findings    +-----------------+-------------+----------+--------------+ Shoulder             3.20       10.00                  +-----------------+-------------+----------+--------------+ Prox upper arm       3.30        6.90                  +-----------------+-------------+----------+--------------+ Mid upper arm        3.10        8.10     branching     +-----------------+-------------+----------+--------------+ Dist upper arm       2.10        7.90                  +-----------------+-------------+----------+--------------+ Antecubital fossa    1.90        5.40                  +-----------------+-------------+----------+--------------+ Prox forearm         1.10        2.80                  +-----------------+-------------+----------+--------------+ Mid forearm  not visualized +-----------------+-------------+----------+--------------+ Distal forearm                          not visualized +-----------------+-------------+----------+--------------+ +-----------------+-------------+----------+---------+ Left Cephalic    Diameter (mm)Depth (mm)Findings  +-----------------+-------------+----------+---------+ Shoulder             2.20       16.00             +-----------------+-------------+----------+---------+ Prox upper arm       2.00        6.60             +-----------------+-------------+----------+---------+ Mid upper arm        2.20        5.60             +-----------------+-------------+----------+---------+ Dist upper arm       0.30        4.50   branching +-----------------+-------------+----------+---------+ Antecubital fossa    2.80        5.40             +-----------------+-------------+----------+---------+ Prox forearm         1.40        2.30             +-----------------+-------------+----------+---------+ Mid forearm          1.60        2.30             +-----------------+-------------+----------+---------+ Dist forearm         1.20        2.40             +-----------------+-------------+----------+---------+ +-----------------+-------------+----------+---------+ Left Basilic     Diameter (mm)Depth (mm)Findings  +-----------------+-------------+----------+---------+ Shoulder             2.60       15.10              +-----------------+-------------+----------+---------+ Prox upper arm       3.00       14.10             +-----------------+-------------+----------+---------+ Mid upper arm        2.70        8.70             +-----------------+-------------+----------+---------+ Dist upper arm       2.40        6.10   branching +-----------------+-------------+----------+---------+ Antecubital fossa    1.70        4.80   branching +-----------------+-------------+----------+---------+ Prox forearm         1.70        2.40             +-----------------+-------------+----------+---------+ Mid forearm          0.10        2.00             +-----------------+-------------+----------+---------+ Distal forearm       0.10        1.80             +-----------------+-------------+----------+---------+ Final Interpretation: *See table(s) above for measurements and observations.  Deitra Mayo Electronically signed by Deitra Mayo on 09/28/2017 at 6:23:48 AM.     Micro Results     No results found for this or any previous visit (from the past 240 hour(s)).  Today   Subjective    Hector Mahoney today has no headache,no chest abdominal pain,no new weakness tingling or numbness, feels  much better wants to go home today.     Objective   Blood pressure 123/71, pulse 80, temperature 98.2 F (36.8 C), temperature source Oral, resp. rate 18, height 5\' 6"  (1.676 m), weight 93.9 kg (207 lb), SpO2 99 %.   Intake/Output Summary (Last 24 hours) at 09/28/2017 1008 Last data filed at 09/28/2017 0934 Gross per 24 hour  Intake 360 ml  Output 900 ml  Net -540 ml    Exam Awake Alert, Oriented x 3, No new F.N deficits, Normal affect [place].AT,PERRAL Supple Neck,No JVD, No cervical lymphadenopathy appriciated.  Symmetrical Chest wall movement, Good air movement bilaterally, CTAB RRR,No Gallops,Rubs or new Murmurs, No Parasternal Heave +ve B.Sounds, Abd Soft, Non tender, No  organomegaly appriciated, No rebound -guarding or rigidity. No Cyanosis, Clubbing or edema, No new Rash or bruise   Data Review   CBC w Diff:  Lab Results  Component Value Date   WBC 6.2 09/28/2017   HGB 8.6 (L) 09/28/2017   HCT 27.3 (L) 09/28/2017   PLT 278 09/28/2017   LYMPHOPCT 23 06/02/2016   MONOPCT 8 06/02/2016   EOSPCT 5 06/02/2016   BASOPCT 1 06/02/2016    CMP:  Lab Results  Component Value Date   NA 139 09/28/2017   K 3.8 09/28/2017   CL 106 09/28/2017   CO2 21 (L) 09/28/2017   BUN 94 (H) 09/28/2017   CREATININE 9.23 (H) 09/28/2017   PROT 7.3 09/27/2017   ALBUMIN 3.3 (L) 09/27/2017   BILITOT 0.9 09/27/2017   ALKPHOS 97 09/27/2017   AST 16 09/27/2017   ALT 21 09/27/2017  .   Total Time in preparing paper work, data evaluation and todays exam - 34 minutes  Lala Lund M.D on 09/28/2017 at 10:08 AM  [facility]   Office  [phone]

## 2017-09-28 NOTE — Progress Notes (Signed)
Discharge instructions and medications discussed with patient.  Prescriptions and AVS given to patient. All questions answered.  

## 2017-10-07 ENCOUNTER — Encounter: Payer: Self-pay | Admitting: Internal Medicine

## 2017-10-07 ENCOUNTER — Ambulatory Visit: Payer: Federal, State, Local not specified - PPO | Admitting: Internal Medicine

## 2017-10-07 VITALS — BP 146/84 | HR 96 | Temp 98.2°F | Ht 66.0 in | Wt 212.0 lb

## 2017-10-07 DIAGNOSIS — E1165 Type 2 diabetes mellitus with hyperglycemia: Secondary | ICD-10-CM

## 2017-10-07 DIAGNOSIS — E1022 Type 1 diabetes mellitus with diabetic chronic kidney disease: Secondary | ICD-10-CM

## 2017-10-07 DIAGNOSIS — N185 Chronic kidney disease, stage 5: Secondary | ICD-10-CM | POA: Diagnosis not present

## 2017-10-07 DIAGNOSIS — E1159 Type 2 diabetes mellitus with other circulatory complications: Secondary | ICD-10-CM | POA: Diagnosis not present

## 2017-10-07 DIAGNOSIS — I10 Essential (primary) hypertension: Secondary | ICD-10-CM

## 2017-10-07 DIAGNOSIS — G4762 Sleep related leg cramps: Secondary | ICD-10-CM

## 2017-10-07 DIAGNOSIS — I5032 Chronic diastolic (congestive) heart failure: Secondary | ICD-10-CM | POA: Diagnosis not present

## 2017-10-07 LAB — POCT GLYCOSYLATED HEMOGLOBIN (HGB A1C): HEMOGLOBIN A1C: 7.3

## 2017-10-07 MED ORDER — CYCLOBENZAPRINE HCL 10 MG PO TABS: 10.0000 mg | ORAL_TABLET | Freq: Every evening | ORAL | Status: DC | PRN

## 2017-10-07 MED ORDER — METOLAZONE 5 MG PO TABS: 5.0000 mg | ORAL_TABLET | Freq: Every day | ORAL | 1 refills | Status: DC

## 2017-10-07 NOTE — Patient Instructions (Addendum)
Please take all new medication as prescribed - the metolazone 5 mg to take 30 min before lasix every AM  Please check your weight every day and take them with you to see Renal on Mar 5  Please take all new medication as prescribed - the higher dose flexeril at night  Your A1c was ok at 7.3 today  Please continue all other medications as before, and refills have been done if requested.  Please have the pharmacy call with any other refills you may need.  Please continue your efforts at being more active, low cholesterol diet, and weight control  Please keep your appointments with your specialists as you may have planned  Please return in 6 months, or sooner if needed

## 2017-10-07 NOTE — Assessment & Plan Note (Signed)
Suspect suboptimal tx due to pt anxiety regarding medication tx and risk of low blood pressures, I encouraged med compliance

## 2017-10-07 NOTE — Progress Notes (Signed)
Subjective:    Patient ID: Hector Mahoney, male    DOB: 11-Sep-1970, 47 y.o.   MRN: [id number]  HPI  Here to f/u recent hospn feb 15-16 with volume overload in the setting of now ESRD, has not yet had to start HD, s/p vein mapping.  Incidentally is having some cognitive difficulty due to lower sugar currently, better with juice given in the office, though not well documented as staff thought I asked for an A1c test instead of the intended CBG.  Also pt worried about worsening volume again, is somewhat vague on taking the lasix every day, and c/o night leg cramps after taking the lasix the prior AM.  Flexeril 5 mg not working well.  Also worried about low blood pressures though has not checked, and seems vague on taking all antiHTN meds as prescribed.  Breathing worse since d/c with DOE and orthopnea at lower levels, gets to the point he feels near syncope with breathing so hard.  .Has f/u with Dr Laray Anger mar 5.  Wt up 5 lbs Wt Readings from Last 3 Encounters:  10/07/17 212 lb (96.2 kg)  09/27/17 207 lb (93.9 kg)  09/13/17 212 lb (96.2 kg)   Past Medical History:  Diagnosis Date  . ABSCESS, TOOTH 04/23/2009  . AKI (acute kidney injury) ([place]) 08/2015  . CHF (congestive heart failure) ([place])   . DIABETES MELLITUS, TYPE I 07/30/2007  . Esophageal reflux 04/27/2009  . FATIGUE 04/27/2009  . HYPERLIPIDEMIA 04/24/2007  . HYPERTENSION 04/24/2007  . INSOMNIA-SLEEP DISORDER-UNSPEC 04/27/2009  . LUMBAR STRAIN, ACUTE 12/11/2008  . RASH-NONVESICULAR 03/11/2008  . Shortness of breath    "lying down and w/exertion right now" (12/22/2012)   Past Surgical History:  Procedure Laterality Date  . MOUTH SURGERY      reports that he quit smoking about 2 years ago. His smoking use included cigarettes. he has never used smokeless tobacco. He reports that he drinks alcohol. He reports that he does not use drugs. family history includes Hypertension in his mother. No Known Allergies Current Outpatient  Medications on File Prior to Visit  Medication Sig Dispense Refill  . amLODipine (NORVASC) 5 MG tablet Take 1 tablet (5 mg total) by mouth daily. 30 tablet 1  . aspirin EC 81 MG EC tablet Take 1 tablet (81 mg total) by mouth daily. 30 tablet 0  . calcitRIOL (ROCALTROL) 0.25 MCG capsule Take 1 capsule (0.25 mcg total) by mouth every Monday, Wednesday, and Friday at 6 PM. 15 capsule 0  . ferrous sulfate 325 (65 FE) MG tablet Take 1 tablet (325 mg total) by mouth 2 (two) times daily with a meal. 60 tablet 0  . furosemide (LASIX) 80 MG tablet Take 1 tablet (80 mg total) by mouth daily. 30 tablet 0  . hydrALAZINE (APRESOLINE) 25 MG tablet Take 1 tablet (25 mg total) by mouth 3 (three) times daily. 90 tablet 0  . insulin aspart (NOVOLOG) 100 UNIT/ML injection Inject 8-12 Units into the skin 3 (three) times daily with meals. (Patient taking differently: Inject 20 Units into the skin 3 (three) times daily with meals. ) 2 vial 5  . insulin glargine (LANTUS) 100 UNIT/ML injection Inject 0.4 mLs (40 Units total) into the skin at bedtime. 20 mL 5  . Insulin Syringe-Needle U-100 (RELION INSULIN SYRINGE) 31G X 15/64" 0.5 ML MISC Use 4 times daily. 300 each 5  . isosorbide mononitrate (IMDUR) 30 MG 24 hr tablet TAKE ONE-HALF TABLET BY MOUTH ONCE DAILY 90 tablet 1  .  labetalol (NORMODYNE) 200 MG tablet Take 1 tablet (200 mg total) by mouth 2 (two) times daily. 60 tablet 0  . rOPINIRole (REQUIP) 0.25 MG tablet TAKE 1 TABLET BY MOUTH AT BEDTIME 90 tablet 3  . rosuvastatin (CRESTOR) 20 MG tablet TAKE 1 TABLET BY MOUTH ONCE DAILY 30 tablet 3  . traMADol (ULTRAM) 50 MG tablet Take 1 tablet (50 mg total) by mouth every 8 (eight) hours as needed. (Patient taking differently: Take 50 mg by mouth every 8 (eight) hours as needed for moderate pain. ) 60 tablet 0   No current facility-administered medications on file prior to visit.    Review of Systems  Constitutional: Negative for other unusual diaphoresis or  sweats HENT: Negative for ear discharge or swelling Eyes: Negative for other worsening visual disturbances Respiratory: Negative for stridor or other swelling  Gastrointestinal: Negative for worsening distension or other blood Genitourinary: Negative for retention or other urinary change Musculoskeletal: Negative for other MSK pain or swelling Skin: Negative for color change or other new lesions Neurological: Negative for worsening tremors and other numbness  Psychiatric/Behavioral: Negative for worsening agitation or other fatigue All other system neg per pt    Objective:   Physical Exam BP (!) 146/84   Pulse 96   Temp 98.2 F (36.8 C) (Oral)   Ht 5\' 6"  (1.676 m)   Wt 212 lb (96.2 kg)   SpO2 100%   BMI 34.22 kg/m  VS noted, obese, not ill appaering Constitutional: Pt appears in NAD HENT: Head: NCAT.  Right Ear: External ear normal.  Left Ear: External ear normal.  Eyes: . Pupils are equal, round, and reactive to light. Conjunctivae and EOM are normal Nose: without d/c or deformity Neck: Neck supple. Gross normal ROM Cardiovascular: Normal rate and regular rhythm.   Pulmonary/Chest: Effort normal and breath sounds without rales or wheezing.  Neurological: Pt is alert. At baseline orientation, motor grossly intact Skin: Skin is warm. No rashes, other new lesions, no LE edema Psychiatric: Pt behavior is normal without agitation , 1+ nervous No other exam findings  Contains abnormal data POCT glycosylated hemoglobin (Hb A1C)   Component 15:56  Hemoglobin A1C 7.3             Assessment & Plan:

## 2017-10-07 NOTE — Assessment & Plan Note (Signed)
[place] for increased flexeril 10 qhs prn

## 2017-10-07 NOTE — Assessment & Plan Note (Signed)
Better controlled recently I suspect due to worsening renal fxn and cont'd med tx with less renal clearance, had probable low sugar in office, to cont diet, wt control

## 2017-10-07 NOTE — Assessment & Plan Note (Signed)
To f/u mar 5 with renal, with labs, to be considered for eventual peritoneal dialysis

## 2017-10-07 NOTE — Assessment & Plan Note (Signed)
With likely mild volume overload by symptoms in the setting of esrd; encouraged compliance with all med and add metolazone 5 qam prior to lasix.

## 2017-10-10 ENCOUNTER — Ambulatory Visit: Payer: Self-pay | Admitting: *Deleted

## 2017-10-10 DIAGNOSIS — I509 Heart failure, unspecified: Secondary | ICD-10-CM

## 2017-10-10 NOTE — Telephone Encounter (Signed)
Leg cramps are not likely related to the metolozone itself as he had this before starting this medication  It would be very unlikely to be low in something, but a BMP and Magnesium can be checked at his convenience  Since he has been successful with wt loss (and I presume his symptoms of dyspnea), he can cut back to taking the metolozone to once per day as needed for weight gain over 3-5 lbs (he should be checking his wt at home every AM)

## 2017-10-10 NOTE — Telephone Encounter (Signed)
Pt has been informed and expressed understanding but he did mention that he has already been taking one metolozone per day. Please advise.

## 2017-10-10 NOTE — Telephone Encounter (Signed)
Ok to clarify with the patient that my advice was to take the metolazone ONLY as needed (first thing in the AM) for swelling or worsening dyspnea or wt gain of 3-5 lbs

## 2017-10-10 NOTE — Telephone Encounter (Signed)
Patient started Metolazone and has questions about side effects he is experiencing.He reports the medication is working great. He can be reached at [phone] Patient has been hydrating. Reason for Disposition . Caller has NON-URGENT medication question about med that PCP prescribed and triager unable to answer question  Answer Assessment - Initial Assessment Questions 1. SYMPTOMS: "Do you have any symptoms?"     At night patient is having severe leg cramps 2. SEVERITY: If symptoms are present, ask "Are they mild, moderate or severe?"    Cramps can shoot into the thighs at times- all the time- more intense at night. During the day not as bad- but is getting more frequent.  Weight- 212- to 201- can he be deficient in something?  Protocols used: MEDICATION QUESTION CALL-A-AH

## 2017-10-11 NOTE — Telephone Encounter (Signed)
Pt has been informed and expressed understanding.  

## 2017-10-11 NOTE — Telephone Encounter (Signed)
No, but not likely to very effective, and more likely he is just giving his money away to an unscrupulous person who doesn't care if it actually works  I have not heard of any person getting into trouble, so the choice is his

## 2017-10-11 NOTE — Telephone Encounter (Signed)
Pt wanted to know would be any danger if he took an OTC supplement of magnesium? Please advise.

## 2017-10-15 DIAGNOSIS — N179 Acute kidney failure, unspecified: Secondary | ICD-10-CM | POA: Diagnosis not present

## 2017-10-15 DIAGNOSIS — I120 Hypertensive chronic kidney disease with stage 5 chronic kidney disease or end stage renal disease: Secondary | ICD-10-CM | POA: Diagnosis not present

## 2017-10-15 DIAGNOSIS — N185 Chronic kidney disease, stage 5: Secondary | ICD-10-CM | POA: Diagnosis not present

## 2017-10-15 DIAGNOSIS — N189 Chronic kidney disease, unspecified: Secondary | ICD-10-CM | POA: Diagnosis not present

## 2017-10-15 DIAGNOSIS — N2581 Secondary hyperparathyroidism of renal origin: Secondary | ICD-10-CM | POA: Diagnosis not present

## 2017-10-15 DIAGNOSIS — D631 Anemia in chronic kidney disease: Secondary | ICD-10-CM | POA: Diagnosis not present

## 2017-10-16 ENCOUNTER — Ambulatory Visit: Payer: Federal, State, Local not specified - PPO | Admitting: Cardiology

## 2017-10-16 ENCOUNTER — Encounter: Payer: Self-pay | Admitting: Cardiology

## 2017-10-16 VITALS — BP 134/82 | HR 96 | Ht 66.0 in | Wt 208.0 lb

## 2017-10-16 DIAGNOSIS — E118 Type 2 diabetes mellitus with unspecified complications: Secondary | ICD-10-CM

## 2017-10-16 DIAGNOSIS — I5033 Acute on chronic diastolic (congestive) heart failure: Secondary | ICD-10-CM | POA: Diagnosis not present

## 2017-10-16 DIAGNOSIS — E1022 Type 1 diabetes mellitus with diabetic chronic kidney disease: Secondary | ICD-10-CM

## 2017-10-16 DIAGNOSIS — N185 Chronic kidney disease, stage 5: Secondary | ICD-10-CM

## 2017-10-16 DIAGNOSIS — IMO0001 Reserved for inherently not codable concepts without codable children: Secondary | ICD-10-CM

## 2017-10-16 DIAGNOSIS — I10 Essential (primary) hypertension: Secondary | ICD-10-CM | POA: Diagnosis not present

## 2017-10-16 DIAGNOSIS — Z794 Long term (current) use of insulin: Secondary | ICD-10-CM | POA: Diagnosis not present

## 2017-10-16 NOTE — Patient Instructions (Signed)
Medication Instructions:  Your physician recommends that you continue on your current medications as directed. Please refer to the Current Medication list given to you today.  Labwork: None   Testing/Procedures: none  Follow-Up: Your physician wants you to follow-up in: 6 months Dr Oval Linsey. You will receive a reminder letter in the mail two months in advance. If you don't receive a letter, please call our office to schedule the follow-up appointment.  Any Other Special Instructions Will Be Listed Below (If Applicable). If you need a refill on your cardiac medications before your next appointment, please call your pharmacy.

## 2017-10-16 NOTE — Progress Notes (Signed)
10/16/2017 Hector Mahoney   Feb 24, 1971  [id number]  Primary Physician Biagio Borg, MD Primary Cardiologist: Dr Oval Linsey  HPI:  47 y/o AA male with HTN and chronic diastolic CHF, now with ESRD, seen in the office today for a post hospital follow up. He was admitted 2/15-2/16/19 with CHF. His SCr is 8.8. He was seen by nephrology and he is to have an AVF placed in the near future. His discharge weight was 207 lbs. An echo done 09/27/17 showed an EF of 60-65% with grade 2 DD and elevated filling pressure. His weight today is 208. He has DOE but no orthopnea, no chest pain.    Current Outpatient Medications  Medication Sig Dispense Refill  . amLODipine (NORVASC) 5 MG tablet Take 1 tablet (5 mg total) by mouth daily. 30 tablet 1  . aspirin EC 81 MG EC tablet Take 1 tablet (81 mg total) by mouth daily. 30 tablet 0  . calcitRIOL (ROCALTROL) 0.25 MCG capsule Take 1 capsule (0.25 mcg total) by mouth every Monday, Wednesday, and Friday at 6 PM. 15 capsule 0  . cyclobenzaprine (FLEXERIL) 10 MG tablet Take 1 tablet (10 mg total) by mouth at bedtime as needed for muscle spasms. 90 tablet 01  . ferrous sulfate 325 (65 FE) MG tablet Take 1 tablet (325 mg total) by mouth 2 (two) times daily with a meal. 60 tablet 0  . furosemide (LASIX) 80 MG tablet Take 1 tablet (80 mg total) by mouth daily. 30 tablet 0  . hydrALAZINE (APRESOLINE) 25 MG tablet Take 1 tablet (25 mg total) by mouth 3 (three) times daily. 90 tablet 0  . insulin aspart (NOVOLOG) 100 UNIT/ML injection Inject 8-12 Units into the skin 3 (three) times daily with meals. (Patient taking differently: Inject 20 Units into the skin 3 (three) times daily with meals. ) 2 vial 5  . insulin glargine (LANTUS) 100 UNIT/ML injection Inject 0.4 mLs (40 Units total) into the skin at bedtime. 20 mL 5  . Insulin Syringe-Needle U-100 (RELION INSULIN SYRINGE) 31G X 15/64" 0.5 ML MISC Use 4 times daily. 300 each 5  . isosorbide mononitrate (IMDUR) 30 MG 24 hr  tablet TAKE ONE-HALF TABLET BY MOUTH ONCE DAILY 90 tablet 1  . labetalol (NORMODYNE) 200 MG tablet Take 1 tablet (200 mg total) by mouth 2 (two) times daily. 60 tablet 0  . Magnesium Carbonate POWD by Does not apply route 2 (two) times daily.    . metolazone (ZAROXOLYN) 5 MG tablet Take 1 tablet (5 mg total) by mouth daily. 30 tablet 1  . rOPINIRole (REQUIP) 0.25 MG tablet TAKE 1 TABLET BY MOUTH AT BEDTIME 90 tablet 3  . rosuvastatin (CRESTOR) 20 MG tablet TAKE 1 TABLET BY MOUTH ONCE DAILY 30 tablet 3  . traMADol (ULTRAM) 50 MG tablet Take 1 tablet (50 mg total) by mouth every 8 (eight) hours as needed. (Patient taking differently: Take 50 mg by mouth every 8 (eight) hours as needed for moderate pain. ) 60 tablet 0   No current facility-administered medications for this visit.     No Known Allergies  Past Medical History:  Diagnosis Date  . ABSCESS, TOOTH 04/23/2009  . AKI (acute kidney injury) ([place]) 08/2015  . CHF (congestive heart failure) ([place])   . DIABETES MELLITUS, TYPE I 07/30/2007  . Esophageal reflux 04/27/2009  . FATIGUE 04/27/2009  . HYPERLIPIDEMIA 04/24/2007  . HYPERTENSION 04/24/2007  . INSOMNIA-SLEEP DISORDER-UNSPEC 04/27/2009  . LUMBAR STRAIN, ACUTE 12/11/2008  . RASH-NONVESICULAR  03/11/2008  . Shortness of breath    "lying down and w/exertion right now" (12/22/2012)    Social History   Socioeconomic History  . Marital status: Married    Spouse name: Not on file  . Number of children: Not on file  . Years of education: Not on file  . Highest education level: Not on file  Social Needs  . Financial resource strain: Not on file  . Food insecurity - worry: Not on file  . Food insecurity - inability: Not on file  . Transportation needs - medical: Not on file  . Transportation needs - non-medical: Not on file  Occupational History  . Not on file  Tobacco Use  . Smoking status: Former Smoker    Types: Cigarettes    Last attempt to quit: 05/03/2015    Years since quitting:  2.4  . Smokeless tobacco: Never Used  . Tobacco comment: 12/22/2012 "used to smoke cigarettes once or twice/month; haven't had any cigarettes for years"  Substance and Sexual Activity  . Alcohol use: Yes    Comment: occ  . Drug use: No  . Sexual activity: Yes  Other Topics Concern  . Not on file  Social History Narrative  . Not on file     Family History  Problem Relation Age of Onset  . Hypertension Mother      Review of Systems: General: negative for chills, fever, night sweats or weight changes.  Cardiovascular: negative for chest pain, dyspnea on exertion, edema, orthopnea, palpitations, paroxysmal nocturnal dyspnea or shortness of breath Dermatological: negative for rash Respiratory: negative for cough or wheezing Urologic: negative for hematuria Abdominal: negative for nausea, vomiting, diarrhea, bright red blood per rectum, melena, or hematemesis Neurologic: negative for visual changes, syncope, or dizziness All other systems reviewed and are otherwise negative except as noted above.    Blood pressure 134/82, pulse 96, height 5\' 6"  (1.676 m), weight 208 lb (94.3 kg).  General appearance: alert, cooperative, no distress and moderately obese Neck: no JVD Lungs: clear to auscultation bilaterally Heart: regular rate and rhythm Extremities: extremities normal, atraumatic, no cyanosis or edema Skin: Skin color, texture, turgor normal. 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 He is not in CHF now. His best course is to  follow through with Dr Marval Regal. I suspect he will need dialysis soon.   He denies chest pain and has no history of CAD. He is an acceptable risk from a cardiac standpoint for AVF placement without further cardiac testing.   Kerin Ransom PA-C 10/16/2017 9:40 AM

## 2017-10-16 NOTE — Assessment & Plan Note (Signed)
On high dose statin Rx 

## 2017-10-16 NOTE — Assessment & Plan Note (Signed)
AVF to be placed

## 2017-10-16 NOTE — Assessment & Plan Note (Signed)
Controlled.  

## 2017-10-16 NOTE — Assessment & Plan Note (Signed)
Pt has ESRD

## 2017-10-16 NOTE — Assessment & Plan Note (Signed)
Echo Feb 2019- EF 60-65% with grade 2 DD

## 2017-10-20 ENCOUNTER — Emergency Department (HOSPITAL_COMMUNITY): Payer: Federal, State, Local not specified - PPO

## 2017-10-20 ENCOUNTER — Other Ambulatory Visit: Payer: Self-pay | Admitting: Internal Medicine

## 2017-10-20 ENCOUNTER — Other Ambulatory Visit: Payer: Self-pay

## 2017-10-20 ENCOUNTER — Encounter (HOSPITAL_COMMUNITY): Payer: Self-pay | Admitting: Pharmacy Technician

## 2017-10-20 ENCOUNTER — Inpatient Hospital Stay (HOSPITAL_COMMUNITY)
Admission: EM | Admit: 2017-10-20 | Discharge: 2017-10-25 | DRG: 674 | Disposition: A | Discharge status: Home / Self Care | Payer: Federal, State, Local not specified - PPO | Attending: Internal Medicine | Admitting: Internal Medicine

## 2017-10-20 DIAGNOSIS — R51 Headache: Secondary | ICD-10-CM | POA: Diagnosis not present

## 2017-10-20 DIAGNOSIS — E875 Hyperkalemia: Secondary | ICD-10-CM | POA: Diagnosis not present

## 2017-10-20 DIAGNOSIS — N179 Acute kidney failure, unspecified: Principal | ICD-10-CM | POA: Diagnosis present

## 2017-10-20 DIAGNOSIS — R404 Transient alteration of awareness: Secondary | ICD-10-CM | POA: Diagnosis not present

## 2017-10-20 DIAGNOSIS — E1022 Type 1 diabetes mellitus with diabetic chronic kidney disease: Secondary | ICD-10-CM | POA: Diagnosis not present

## 2017-10-20 DIAGNOSIS — R55 Syncope and collapse: Secondary | ICD-10-CM | POA: Diagnosis not present

## 2017-10-20 DIAGNOSIS — D638 Anemia in other chronic diseases classified elsewhere: Secondary | ICD-10-CM | POA: Diagnosis present

## 2017-10-20 DIAGNOSIS — Z794 Long term (current) use of insulin: Secondary | ICD-10-CM

## 2017-10-20 DIAGNOSIS — Z79899 Other long term (current) drug therapy: Secondary | ICD-10-CM

## 2017-10-20 DIAGNOSIS — J9811 Atelectasis: Secondary | ICD-10-CM | POA: Diagnosis not present

## 2017-10-20 DIAGNOSIS — I10 Essential (primary) hypertension: Secondary | ICD-10-CM | POA: Diagnosis not present

## 2017-10-20 DIAGNOSIS — N186 End stage renal disease: Secondary | ICD-10-CM

## 2017-10-20 DIAGNOSIS — Z7982 Long term (current) use of aspirin: Secondary | ICD-10-CM | POA: Diagnosis not present

## 2017-10-20 DIAGNOSIS — S0121XA Laceration without foreign body of nose, initial encounter: Secondary | ICD-10-CM

## 2017-10-20 DIAGNOSIS — E872 Acidosis: Secondary | ICD-10-CM | POA: Diagnosis present

## 2017-10-20 DIAGNOSIS — S199XXA Unspecified injury of neck, initial encounter: Secondary | ICD-10-CM | POA: Diagnosis not present

## 2017-10-20 DIAGNOSIS — E785 Hyperlipidemia, unspecified: Secondary | ICD-10-CM | POA: Diagnosis present

## 2017-10-20 DIAGNOSIS — I132 Hypertensive heart and chronic kidney disease with heart failure and with stage 5 chronic kidney disease, or end stage renal disease: Secondary | ICD-10-CM | POA: Diagnosis not present

## 2017-10-20 DIAGNOSIS — I120 Hypertensive chronic kidney disease with stage 5 chronic kidney disease or end stage renal disease: Secondary | ICD-10-CM | POA: Diagnosis not present

## 2017-10-20 DIAGNOSIS — Z87891 Personal history of nicotine dependence: Secondary | ICD-10-CM | POA: Diagnosis not present

## 2017-10-20 DIAGNOSIS — N185 Chronic kidney disease, stage 5: Secondary | ICD-10-CM

## 2017-10-20 DIAGNOSIS — I43 Cardiomyopathy in diseases classified elsewhere: Secondary | ICD-10-CM | POA: Diagnosis not present

## 2017-10-20 DIAGNOSIS — Z4901 Encounter for fitting and adjustment of extracorporeal dialysis catheter: Secondary | ICD-10-CM | POA: Diagnosis not present

## 2017-10-20 DIAGNOSIS — R42 Dizziness and giddiness: Secondary | ICD-10-CM | POA: Diagnosis not present

## 2017-10-20 DIAGNOSIS — I5032 Chronic diastolic (congestive) heart failure: Secondary | ICD-10-CM | POA: Diagnosis not present

## 2017-10-20 DIAGNOSIS — D509 Iron deficiency anemia, unspecified: Secondary | ICD-10-CM | POA: Diagnosis present

## 2017-10-20 DIAGNOSIS — W01198A Fall on same level from slipping, tripping and stumbling with subsequent striking against other object, initial encounter: Secondary | ICD-10-CM | POA: Diagnosis present

## 2017-10-20 DIAGNOSIS — D631 Anemia in chronic kidney disease: Secondary | ICD-10-CM | POA: Diagnosis not present

## 2017-10-20 DIAGNOSIS — R1110 Vomiting, unspecified: Secondary | ICD-10-CM

## 2017-10-20 DIAGNOSIS — S0993XA Unspecified injury of face, initial encounter: Secondary | ICD-10-CM | POA: Diagnosis not present

## 2017-10-20 DIAGNOSIS — S0081XA Abrasion of other part of head, initial encounter: Secondary | ICD-10-CM | POA: Diagnosis not present

## 2017-10-20 DIAGNOSIS — E109 Type 1 diabetes mellitus without complications: Secondary | ICD-10-CM | POA: Diagnosis present

## 2017-10-20 LAB — CBC WITH DIFFERENTIAL/PLATELET
Basophils Absolute: 0 10*3/uL (ref 0.0–0.1)
Basophils Relative: 0 %
EOS ABS: 0.4 10*3/uL (ref 0.0–0.7)
EOS PCT: 3 %
HCT: 25.6 % — ABNORMAL LOW (ref 39.0–52.0)
Hemoglobin: 7.9 g/dL — ABNORMAL LOW (ref 13.0–17.0)
LYMPHS ABS: 1.7 10*3/uL (ref 0.7–4.0)
Lymphocytes Relative: 16 %
MCH: 23.4 pg — AB (ref 26.0–34.0)
MCHC: 30.9 g/dL (ref 30.0–36.0)
MCV: 75.7 fL — ABNORMAL LOW (ref 78.0–100.0)
MONOS PCT: 3 %
Monocytes Absolute: 0.3 10*3/uL (ref 0.1–1.0)
Neutro Abs: 8 10*3/uL — ABNORMAL HIGH (ref 1.7–7.7)
Neutrophils Relative %: 78 %
PLATELETS: 300 10*3/uL (ref 150–400)
RBC: 3.38 MIL/uL — ABNORMAL LOW (ref 4.22–5.81)
RDW: 16.6 % — ABNORMAL HIGH (ref 11.5–15.5)
WBC: 10.4 10*3/uL (ref 4.0–10.5)

## 2017-10-20 LAB — I-STAT TROPONIN, ED: TROPONIN I, POC: 0.03 ng/mL (ref 0.00–0.08)

## 2017-10-20 LAB — COMPREHENSIVE METABOLIC PANEL
ALT: 37 U/L (ref 17–63)
ANION GAP: 15 (ref 5–15)
AST: 45 U/L — ABNORMAL HIGH (ref 15–41)
Albumin: 3.2 g/dL — ABNORMAL LOW (ref 3.5–5.0)
Alkaline Phosphatase: 107 U/L (ref 38–126)
BUN: 133 mg/dL — ABNORMAL HIGH (ref 6–20)
CHLORIDE: 97 mmol/L — AB (ref 101–111)
CO2: 22 mmol/L (ref 22–32)
Calcium: 8.1 mg/dL — ABNORMAL LOW (ref 8.9–10.3)
Creatinine, Ser: 12.59 mg/dL — ABNORMAL HIGH (ref 0.61–1.24)
GFR calc non Af Amer: 4 mL/min — ABNORMAL LOW (ref >60)
GFR, EST AFRICAN AMERICAN: 5 mL/min — AB (ref >60)
Glucose, Bld: 94 mg/dL (ref 65–99)
Potassium: 5.6 mmol/L — ABNORMAL HIGH (ref 3.5–5.1)
SODIUM: 134 mmol/L — AB (ref 135–145)
Total Bilirubin: 1.1 mg/dL (ref 0.3–1.2)
Total Protein: 7 g/dL (ref 6.5–8.1)

## 2017-10-20 LAB — GLUCOSE, CAPILLARY: Glucose-Capillary: 269 mg/dL — ABNORMAL HIGH (ref 65–99)

## 2017-10-20 LAB — MAGNESIUM: MAGNESIUM: 3.8 mg/dL — AB (ref 1.7–2.4)

## 2017-10-20 LAB — CBG MONITORING, ED: Glucose-Capillary: 86 mg/dL (ref 65–99)

## 2017-10-20 MED ORDER — AMLODIPINE BESYLATE 5 MG PO TABS
5.0000 mg | ORAL_TABLET | Freq: Every day | ORAL | Status: DC
  Administered 2017-10-21: 5 mg via ORAL
  Filled 2017-10-20: qty 1

## 2017-10-20 MED ORDER — ONDANSETRON HCL 4 MG PO TABS
4.0000 mg | ORAL_TABLET | Freq: Four times a day (QID) | ORAL | Status: DC | PRN
Start: 2017-10-20 — End: 2017-10-25
  Administered 2017-10-22: 4 mg via ORAL
  Filled 2017-10-20: qty 1

## 2017-10-20 MED ORDER — SODIUM POLYSTYRENE SULFONATE 15 GM/60ML PO SUSP
15.0000 g | Freq: Once | ORAL | Status: AC
  Administered 2017-10-20: 15 g via ORAL
  Filled 2017-10-20: qty 60

## 2017-10-20 MED ORDER — CALCITRIOL 0.25 MCG PO CAPS
0.2500 ug | ORAL_CAPSULE | ORAL | Status: DC
  Administered 2017-10-21 – 2017-10-23 (×2): 0.25 ug via ORAL
  Filled 2017-10-20 (×2): qty 1

## 2017-10-20 MED ORDER — CYCLOBENZAPRINE HCL 10 MG PO TABS
10.0000 mg | ORAL_TABLET | Freq: Every evening | ORAL | Status: DC | PRN
  Administered 2017-10-21 – 2017-10-23 (×4): 10 mg via ORAL
  Filled 2017-10-20 (×4): qty 1

## 2017-10-20 MED ORDER — INSULIN ASPART 100 UNIT/ML ~~LOC~~ SOLN
0.0000 [IU] | Freq: Three times a day (TID) | SUBCUTANEOUS | Status: DC
  Administered 2017-10-21: 5 [IU] via SUBCUTANEOUS
  Administered 2017-10-21 (×2): 2 [IU] via SUBCUTANEOUS
  Administered 2017-10-22: 3 [IU] via SUBCUTANEOUS
  Administered 2017-10-23: 1 [IU] via SUBCUTANEOUS
  Administered 2017-10-23 – 2017-10-24 (×2): 2 [IU] via SUBCUTANEOUS
  Administered 2017-10-24: 5 [IU] via SUBCUTANEOUS
  Administered 2017-10-25: 2 [IU] via SUBCUTANEOUS
  Administered 2017-10-25: 1 [IU] via SUBCUTANEOUS

## 2017-10-20 MED ORDER — HEPARIN SODIUM (PORCINE) 5000 UNIT/ML IJ SOLN
5000.0000 [IU] | Freq: Three times a day (TID) | INTRAMUSCULAR | Status: AC
  Administered 2017-10-21 (×2): 5000 [IU] via SUBCUTANEOUS
  Filled 2017-10-20 (×2): qty 1

## 2017-10-20 MED ORDER — HYDRALAZINE HCL 25 MG PO TABS
25.0000 mg | ORAL_TABLET | Freq: Three times a day (TID) | ORAL | Status: DC
  Administered 2017-10-20 – 2017-10-21 (×2): 25 mg via ORAL
  Filled 2017-10-20 (×3): qty 1

## 2017-10-20 MED ORDER — DOCUSATE SODIUM 100 MG PO CAPS
100.0000 mg | ORAL_CAPSULE | Freq: Two times a day (BID) | ORAL | Status: DC
  Administered 2017-10-21 – 2017-10-25 (×9): 100 mg via ORAL
  Filled 2017-10-20 (×9): qty 1

## 2017-10-20 MED ORDER — MUSCLE RUB 10-15 % EX CREA
TOPICAL_CREAM | CUTANEOUS | Status: DC | PRN
  Filled 2017-10-20: qty 85

## 2017-10-20 MED ORDER — SODIUM CHLORIDE 0.9% FLUSH
3.0000 mL | Freq: Two times a day (BID) | INTRAVENOUS | Status: DC
  Administered 2017-10-21: 10 mL via INTRAVENOUS
  Administered 2017-10-21 – 2017-10-23 (×4): 3 mL via INTRAVENOUS
  Administered 2017-10-23 – 2017-10-24 (×2): 10 mL via INTRAVENOUS
  Administered 2017-10-25: 3 mL via INTRAVENOUS

## 2017-10-20 MED ORDER — FERROUS SULFATE 325 (65 FE) MG PO TABS
325.0000 mg | ORAL_TABLET | Freq: Two times a day (BID) | ORAL | Status: DC
  Administered 2017-10-21: 325 mg via ORAL
  Filled 2017-10-20: qty 1

## 2017-10-20 MED ORDER — ISOSORBIDE MONONITRATE ER 30 MG PO TB24
15.0000 mg | ORAL_TABLET | Freq: Every day | ORAL | Status: DC
  Administered 2017-10-21 – 2017-10-25 (×4): 15 mg via ORAL
  Filled 2017-10-20 (×4): qty 1

## 2017-10-20 MED ORDER — LIDOCAINE HCL (PF) 1 % IJ SOLN
INTRAMUSCULAR | Status: AC
  Administered 2017-10-20: 18:00:00
  Filled 2017-10-20: qty 5

## 2017-10-20 MED ORDER — INSULIN ASPART 100 UNIT/ML ~~LOC~~ SOLN
3.0000 [IU] | Freq: Three times a day (TID) | SUBCUTANEOUS | Status: DC
  Administered 2017-10-21 (×2): 3 [IU] via SUBCUTANEOUS

## 2017-10-20 MED ORDER — INSULIN ASPART 100 UNIT/ML ~~LOC~~ SOLN
0.0000 [IU] | Freq: Every day | SUBCUTANEOUS | Status: DC
  Administered 2017-10-20: 3 [IU] via SUBCUTANEOUS

## 2017-10-20 MED ORDER — TRAMADOL HCL 50 MG PO TABS
50.0000 mg | ORAL_TABLET | Freq: Three times a day (TID) | ORAL | Status: DC | PRN
  Administered 2017-10-20 – 2017-10-24 (×5): 50 mg via ORAL
  Filled 2017-10-20 (×5): qty 1

## 2017-10-20 MED ORDER — ACETAMINOPHEN 325 MG PO TABS: 650.0000 mg | ORAL_TABLET | Freq: Four times a day (QID) | ORAL | Status: DC | PRN

## 2017-10-20 MED ORDER — LABETALOL HCL 200 MG PO TABS
200.0000 mg | ORAL_TABLET | Freq: Two times a day (BID) | ORAL | Status: DC
  Administered 2017-10-20 – 2017-10-21 (×2): 200 mg via ORAL
  Filled 2017-10-20 (×2): qty 1

## 2017-10-20 MED ORDER — ACETAMINOPHEN 650 MG RE SUPP: 650.0000 mg | Freq: Four times a day (QID) | RECTAL | Status: DC | PRN

## 2017-10-20 MED ORDER — ROPINIROLE HCL 0.25 MG PO TABS
0.2500 mg | ORAL_TABLET | Freq: Every day | ORAL | Status: DC
  Administered 2017-10-20 – 2017-10-24 (×5): 0.25 mg via ORAL
  Filled 2017-10-20 (×5): qty 1

## 2017-10-20 MED ORDER — ONDANSETRON HCL 4 MG/2ML IJ SOLN
4.0000 mg | Freq: Four times a day (QID) | INTRAMUSCULAR | Status: DC | PRN
  Administered 2017-10-23 (×2): 4 mg via INTRAVENOUS
  Filled 2017-10-20 (×3): qty 2

## 2017-10-20 MED ORDER — SODIUM CHLORIDE 0.9 % IV SOLN
INTRAVENOUS | Status: AC
  Administered 2017-10-20: 22:00:00 via INTRAVENOUS

## 2017-10-20 MED ORDER — INSULIN GLARGINE 100 UNIT/ML ~~LOC~~ SOLN
16.0000 [IU] | Freq: Every day | SUBCUTANEOUS | Status: DC
  Administered 2017-10-20: 16 [IU] via SUBCUTANEOUS
  Filled 2017-10-20 (×2): qty 0.16

## 2017-10-20 MED ORDER — ROSUVASTATIN CALCIUM 10 MG PO TABS
10.0000 mg | ORAL_TABLET | Freq: Every day | ORAL | Status: DC
  Administered 2017-10-20 – 2017-10-24 (×5): 10 mg via ORAL
  Filled 2017-10-20 (×5): qty 1

## 2017-10-20 NOTE — ED Triage Notes (Signed)
Pt arrives via ems with reports of syncope while walking. Hit head on concrete. Witnesses reports LOC lasted approx 2-3 minutes. Pt reports dizziness over the last few days. 1 bout of emesis prior to LOC. Pt seen at [facility] 3 weeks ago and told he would need dialysis in the future for kidney function. A&OX4, VSS with EMS. 12 Lead unremarkable. Dizziness occurs with postural changes. Pt is not on blood thinners. Multiple abrasions to face/hands.

## 2017-10-20 NOTE — ED Notes (Signed)
Pt able to stand and use urinal beside of bed with supervision.

## 2017-10-20 NOTE — H&P (Signed)
History and Physical    AZARYAH OLEKSY NOI:[id number] DOB: 05/01/1971 DOA: 10/20/2017  Referring MD/NP/PA: Ocie Cornfield PCP: Biagio Borg, MD   Patient coming from: home  Chief Complaint: syncope  HPI: Hector Mahoney is a 47 y.o. 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  The patient was admitted from 2/15-2/16 secondary to dyspnea on exertion which was attributed to his chronic kidney disease.  He was discharged on Lasix 40 mg once daily which he was compliant with.  Since discharge he was also started on metolazone 5 mg.  His dyspnea has gradually improved and for the last couple of nights he has been less Zalyn Amend of breath at night.  When he exerts himself he is still dyspneic.  For the last week however, when he is up and ambulating he has noticed some dizziness or room spinning sensation.  He states it does not occur right after he stands up but a few moments later and he has the sensation that sound is getting quieter but he denies vision blackening, nausea, diaphoresis.  Today, he was at church and he started to feel a little nauseated and his hearing became quiet again.  He was wheeled from the church pew to his car and when he tried to stand up to get into the car he became flushed, his vision started to black out, he vomited once and then he fell forward onto his face.  His wife states that he had loss of consciousness for about 2-3 minutes and then he awoke and was able to stand up and communicate right away.  He did not lose control of bowels or bladder and did not have any shaking.  He has not had any vomiting or unusual diarrhea this week.  He denies any recent fevers.  He states that his urine has not changed in volume or color since discharge from the hospital.  He has felt thirsty.  He denies any palpitations or chest pressure or pain.  ED Course:  Vital signs stable, blood pressures mildly elevated.  Orthostatic vital signs were negative.  Labs: Potassium 5.6, BUN up to 133, creatinine 12.59.  His magnesium is 3.8.  Hemoglobin is down slightly to 7.9 g/dL.  His troponin was negative and his EKG is stable from prior.  Head CT was negative for hemorrhage or edema.  He had some soft tissue swelling over his nose and lips and he required a few sutures on his nose that were done by the ER physician.  C-spine was stable.  He is being admitted secondary to his worsening kidney function, slightly worse anemia.  He was given a dose of Kayexalate in the emergency department.  Review of Systems:  General:  Denies fevers, chills, weight change HEENT:  Denies changes to hearing and vision, sinus congestion, sore throat CV:  Denies chest pain, palpitations, lower extremity edema.  PULM:  Slightly improved SOB, denies cough.   GI:  denies constipation, diarrhea.   Has had dark stools since discharge which he attributes to his iron GU:  Denies dysuria, frequency, urgency ENDO:  Denies polyuria, polydipsia.   HEME:  Denies blood in stools, abnormal bruising or bleeding.  LYMPH:  Denies lymphadenopathy.   MSK:  Denies arthralgias, myalgias.   DERM:  Denies skin rash or ulcer.   NEURO:  Denies new focal numbness or weakness, slurred speech, confusion PSYCH:  Denies anxiety and  depression.    Past Medical History:  Diagnosis Date  . ABSCESS, TOOTH 04/23/2009  . AKI (acute kidney injury) ([place]) 08/2015  . CHF (congestive heart failure) ([place])   . DIABETES MELLITUS, TYPE I 07/30/2007  . Esophageal reflux 04/27/2009  . FATIGUE 04/27/2009  . HYPERLIPIDEMIA 04/24/2007  . HYPERTENSION 04/24/2007  . INSOMNIA-SLEEP DISORDER-UNSPEC 04/27/2009  . LUMBAR STRAIN, ACUTE 12/11/2008  . RASH-NONVESICULAR 03/11/2008  . Shortness of breath    "lying down and w/exertion right now" (12/22/2012)    Past Surgical History:  Procedure Laterality Date  . MOUTH SURGERY        reports that he quit smoking about 2 years ago. His smoking use included cigarettes. he has never used smokeless tobacco. He reports that he drinks alcohol. He reports that he does not use drugs.  No Known Allergies  Family History  Problem Relation Age of Onset  . Hypertension Mother   . Diabetes Mother   . Diabetes Maternal Aunt   . Kidney disease Neg Hx     Prior to Admission medications   Medication Sig Start Date End Date Taking? Authorizing Provider  amLODipine (NORVASC) 5 MG tablet Take 1 tablet (5 mg total) by mouth daily. 09/05/17   Marrian Salvage, FNP  aspirin EC 81 MG EC tablet Take 1 tablet (81 mg total) by mouth daily. 09/06/15   Nita Sells, MD  calcitRIOL (ROCALTROL) 0.25 MCG capsule Take 1 capsule (0.25 mcg total) by mouth every Monday, Wednesday, and Friday at 6 PM. 09/30/17   Thurnell Lose, MD  cyclobenzaprine (FLEXERIL) 10 MG tablet Take 1 tablet (10 mg total) by mouth at bedtime as needed for muscle spasms. 10/07/17   Biagio Borg, MD  ferrous sulfate 325 (65 FE) MG tablet Take 1 tablet (325 mg total) by mouth 2 (two) times daily with a meal. 09/28/17   Thurnell Lose, MD  furosemide (LASIX) 80 MG tablet Take 1 tablet (80 mg total) by mouth daily. 09/29/17   Thurnell Lose, MD  hydrALAZINE (APRESOLINE) 25 MG tablet Take 1 tablet (25 mg total) by mouth 3 (three) times daily. 09/28/17   Thurnell Lose, MD  insulin aspart (NOVOLOG) 100 UNIT/ML injection Inject 8-12 Units into the skin 3 (three) times daily with meals. Patient taking differently: Inject 20 Units into the skin 3 (three) times daily with meals.  04/25/16   Philemon Kingdom, MD  insulin glargine (LANTUS) 100 UNIT/ML injection Inject 0.4 mLs (40 Units total) into the skin at bedtime. 04/25/16   Philemon Kingdom, MD  isosorbide mononitrate (IMDUR) 30 MG 24 hr tablet TAKE ONE-HALF TABLET BY MOUTH ONCE DAILY 10/12/16   Biagio Borg, MD  labetalol (NORMODYNE) 200 MG tablet Take 1 tablet (200 mg  total) by mouth 2 (two) times daily. 09/28/17   Thurnell Lose, MD  Magnesium Carbonate POWD by Does not apply route 2 (two) times daily.    [provider]  metolazone (ZAROXOLYN) 5 MG tablet Take 1 tablet (5 mg total) by mouth daily. 10/07/17   Biagio Borg, MD  rOPINIRole (REQUIP) 0.25 MG tablet TAKE 1 TABLET BY MOUTH AT BEDTIME 11/22/16   Biagio Borg, MD  rosuvastatin (CRESTOR) 20 MG tablet TAKE 1 TABLET BY MOUTH ONCE DAILY 08/21/17   Skeet Latch, MD  traMADol (ULTRAM) 50 MG tablet Take 1 tablet (50 mg total) by mouth every 8 (eight) hours as needed. Patient taking differently: Take 50 mg by mouth every 8 (eight) hours as needed  for moderate pain.  03/29/17   Biagio Borg, MD    Physical Exam: Vitals:   10/20/17 1715 10/20/17 1745 10/20/17 1800 10/20/17 1845  BP: (!) 144/84 (!) 155/85 140/82 (!) 141/78  Pulse: 89 86 87 95  Resp: 17 14  (!) 22  Temp:      TempSrc:      SpO2: 100% 99% 93% 98%     Constitutional: Adult male, NAD, calm, comfortable Eyes: PERRL, lids and conjunctivae normal ENMT:  Moist mucous membranes.  Oropharynx nonerythematous, no exudates.   He has some crusted blood in both nares and a couple of sutures on the bridge of his nose.  His nose is a little swollen.  Both his upper and lower lips are swollen and he has teeth marks on the inside of both lips upper and lower which are a little bloody.  Poor dentition. Neck:  No nuchal rigidity, no masses Respiratory:  No wheezes, rales, or rhonchi Cardiovascular: Regular rate and rhythm, no murmurs / rubs / gallops.  2+ radial pulses. Abdomen:  Normal active bowel sounds, soft, telemetry distended, nontender Musculoskeletal: Normal muscle tone and bulk.  No contractures.  Skin:  no rashes or ulcers, see HEENT exam Neurologic:  CN 2-12 grossly intact. Sensation intact to light touch, strength 5/5 throughout Psychiatric:  Alert and oriented x 3. Normal affect.   Labs on Admission: I have personally  reviewed following labs and imaging studies  CBC: Recent Labs  Lab 10/20/17 1510  WBC 10.4  NEUTROABS 8.0*  HGB 7.9*  HCT 25.6*  MCV 75.7*  PLT 595   Basic Metabolic Panel: Recent Labs  Lab 10/20/17 1510 10/20/17 1549  NA 134*  --   K 5.6*  --   CL 97*  --   CO2 22  --   GLUCOSE 94  --   BUN 133*  --   CREATININE 12.59*  --   CALCIUM 8.1*  --   MG  --  3.8*   GFR: Estimated Creatinine Clearance: 8 mL/min (A) (by C-G formula based on SCr of 12.59 mg/dL (H)). Liver Function Tests: Recent Labs  Lab 10/20/17 1510  AST 45*  ALT 37  ALKPHOS 107  BILITOT 1.1  PROT 7.0  ALBUMIN 3.2*   No results for input(s): LIPASE, AMYLASE in the last 168 hours. No results for input(s): AMMONIA in the last 168 hours. Coagulation Profile: No results for input(s): INR, PROTIME in the last 168 hours. Cardiac Enzymes: No results for input(s): CKTOTAL, CKMB, CKMBINDEX, TROPONINI in the last 168 hours. BNP (last 3 results) No results for input(s): PROBNP in the last 8760 hours. HbA1C: No results for input(s): HGBA1C in the last 72 hours. CBG: Recent Labs  Lab 10/20/17 1458  GLUCAP 86   Lipid Profile: No results for input(s): CHOL, HDL, LDLCALC, TRIG, CHOLHDL, LDLDIRECT in the last 72 hours. Thyroid Function Tests: No results for input(s): TSH, T4TOTAL, FREET4, T3FREE, THYROIDAB in the last 72 hours. Anemia Panel: No results for input(s): VITAMINB12, FOLATE, FERRITIN, TIBC, IRON, RETICCTPCT in the last 72 hours. Urine analysis:    Component Value Date/Time   COLORURINE YELLOW 06/02/2016 0811   APPEARANCEUR CLEAR 06/02/2016 0811   LABSPEC 1.014 06/02/2016 0811   PHURINE 6.0 06/02/2016 0811   GLUCOSEU 500 (A) 06/02/2016 0811   GLUCOSEU >=1000 04/27/2009 1531   HGBUR MODERATE (A) 06/02/2016 0811   HGBUR trace-intact 04/23/2009 0936   BILIRUBINUR NEGATIVE 06/02/2016 0811   KETONESUR NEGATIVE 06/02/2016 0811   PROTEINUR >300 (  A) 06/02/2016 0811   UROBILINOGEN 0.2 03/29/2014  1729   NITRITE NEGATIVE 06/02/2016 0811   LEUKOCYTESUR NEGATIVE 06/02/2016 0811   Sepsis Labs: @LABRCNTIP (procalcitonin:4,lacticidven:4) )No results found for this or any previous visit (from the past 240 hour(s)).   Radiological Exams on Admission: Dg Chest 2 View  Result Date: 10/20/2017 CLINICAL DATA:  Recent syncopal episode EXAM: CHEST - 2 VIEW COMPARISON:  09/27/2017 FINDINGS: Cardiac shadow is accentuated by the portable technique but stable. The lungs are well aerated bilaterally. Minimal posterior costophrenic angle atelectasis is noted bilaterally. No sizable effusion is seen. No bony abnormality is noted. IMPRESSION: Minimal basilar atelectasis. Electronically Signed   By: Inez Catalina M.D.   On: 10/20/2017 16:01   Ct Head Wo Contrast  Result Date: 10/20/2017 CLINICAL DATA:  Dizzy, fell onto concrete. Injury to face. Abrasion to forehead. Pain and swelling to nose and upper lip. Loss of consciousness. EXAM: CT HEAD WITHOUT CONTRAST CT MAXILLOFACIAL WITHOUT CONTRAST CT CERVICAL SPINE WITHOUT CONTRAST TECHNIQUE: Multidetector CT imaging of the head, cervical spine, and maxillofacial structures were performed using the standard protocol without intravenous contrast. Multiplanar CT image reconstructions of the cervical spine and maxillofacial structures were also generated. COMPARISON:  Head CT dated 09/01/2005 FINDINGS: CT HEAD FINDINGS Brain: Ventricles are normal in size and configuration. All areas of the brain demonstrate normal gray-white matter differentiation. There is no mass, hemorrhage, edema or other evidence of acute parenchymal abnormality. No extra-axial hemorrhage. Vascular: There are chronic calcified atherosclerotic changes of the large vessels at the skull base. No unexpected hyperdense vessel. Skull: Normal. Negative for fracture or focal lesion. Other: None. CT MAXILLOFACIAL FINDINGS Osseous: Lower frontal bones are intact and normally aligned. No displaced nasal bone  fracture. Osseous structures about the orbits are intact and normally aligned bilaterally. Walls of the maxillary sinuses are intact and normally aligned bilaterally. Bilateral zygoma and pterygoid plates are intact. No mandible fracture or displacement seen. Incidental note made of slight anterior subluxation of the left mandibular condyle relative to the condylar fossa, likely related to chronic degenerative change at the TMJ. Orbits: Orbital globes appear intact and symmetric in position. No retro-orbital hemorrhage or edema. Sinuses: Clear Soft tissues: Soft tissue edema overlying the nasal bones and upper lip. No circumscribed soft tissue hematoma appreciated. CT CERVICAL SPINE FINDINGS Alignment: Straightening of the normal cervical spine lordosis, likely related to patient positioning or muscle spasm. No evidence of acute vertebral body subluxation. Skull base and vertebrae: No fracture line or displaced fracture fragment. Facet joints appear intact and normally aligned throughout. Soft tissues and spinal canal: No prevertebral fluid or swelling. No visible canal hematoma. Disc levels: Mild disc desiccations at the C5-6 and C6-7 levels, with associated mild disc space narrowings and mild osseous spurring. No more than mild central canal stenosis at any level. No large disc bulge or protrusion seen. Upper chest: Negative. Other: None IMPRESSION: 1. Negative head CT. No intracranial mass, hemorrhage or edema. No skull fracture. 2. No facial bone fracture or dislocation. Soft tissue edema overlying the nasal bones and upper lip. 3. No fracture or acute subluxation within the cervical spine. Mild degenerative change within the lower cervical spine. Electronically Signed   By: Franki Cabot M.D.   On: 10/20/2017 16:34   Ct Cervical Spine Wo Contrast  Result Date: 10/20/2017 CLINICAL DATA:  Dizzy, fell onto concrete. Injury to face. Abrasion to forehead. Pain and swelling to nose and upper lip. Loss of  consciousness. EXAM: CT HEAD WITHOUT CONTRAST CT MAXILLOFACIAL  WITHOUT CONTRAST CT CERVICAL SPINE WITHOUT CONTRAST TECHNIQUE: Multidetector CT imaging of the head, cervical spine, and maxillofacial structures were performed using the standard protocol without intravenous contrast. Multiplanar CT image reconstructions of the cervical spine and maxillofacial structures were also generated. COMPARISON:  Head CT dated 09/01/2005 FINDINGS: CT HEAD FINDINGS Brain: Ventricles are normal in size and configuration. All areas of the brain demonstrate normal gray-white matter differentiation. There is no mass, hemorrhage, edema or other evidence of acute parenchymal abnormality. No extra-axial hemorrhage. Vascular: There are chronic calcified atherosclerotic changes of the large vessels at the skull base. No unexpected hyperdense vessel. Skull: Normal. Negative for fracture or focal lesion. Other: None. CT MAXILLOFACIAL FINDINGS Osseous: Lower frontal bones are intact and normally aligned. No displaced nasal bone fracture. Osseous structures about the orbits are intact and normally aligned bilaterally. Walls of the maxillary sinuses are intact and normally aligned bilaterally. Bilateral zygoma and pterygoid plates are intact. No mandible fracture or displacement seen. Incidental note made of slight anterior subluxation of the left mandibular condyle relative to the condylar fossa, likely related to chronic degenerative change at the TMJ. Orbits: Orbital globes appear intact and symmetric in position. No retro-orbital hemorrhage or edema. Sinuses: Clear Soft tissues: Soft tissue edema overlying the nasal bones and upper lip. No circumscribed soft tissue hematoma appreciated. CT CERVICAL SPINE FINDINGS Alignment: Straightening of the normal cervical spine lordosis, likely related to patient positioning or muscle spasm. No evidence of acute vertebral body subluxation. Skull base and vertebrae: No fracture line or displaced  fracture fragment. Facet joints appear intact and normally aligned throughout. Soft tissues and spinal canal: No prevertebral fluid or swelling. No visible canal hematoma. Disc levels: Mild disc desiccations at the C5-6 and C6-7 levels, with associated mild disc space narrowings and mild osseous spurring. No more than mild central canal stenosis at any level. No large disc bulge or protrusion seen. Upper chest: Negative. Other: None IMPRESSION: 1. Negative head CT. No intracranial mass, hemorrhage or edema. No skull fracture. 2. No facial bone fracture or dislocation. Soft tissue edema overlying the nasal bones and upper lip. 3. No fracture or acute subluxation within the cervical spine. Mild degenerative change within the lower cervical spine. Electronically Signed   By: Franki Cabot M.D.   On: 10/20/2017 16:34   Ct Maxillofacial Wo Cm  Result Date: 10/20/2017 CLINICAL DATA:  Dizzy, fell onto concrete. Injury to face. Abrasion to forehead. Pain and swelling to nose and upper lip. Loss of consciousness. EXAM: CT HEAD WITHOUT CONTRAST CT MAXILLOFACIAL WITHOUT CONTRAST CT CERVICAL SPINE WITHOUT CONTRAST TECHNIQUE: Multidetector CT imaging of the head, cervical spine, and maxillofacial structures were performed using the standard protocol without intravenous contrast. Multiplanar CT image reconstructions of the cervical spine and maxillofacial structures were also generated. COMPARISON:  Head CT dated 09/01/2005 FINDINGS: CT HEAD FINDINGS Brain: Ventricles are normal in size and configuration. All areas of the brain demonstrate normal gray-white matter differentiation. There is no mass, hemorrhage, edema or other evidence of acute parenchymal abnormality. No extra-axial hemorrhage. Vascular: There are chronic calcified atherosclerotic changes of the large vessels at the skull base. No unexpected hyperdense vessel. Skull: Normal. Negative for fracture or focal lesion. Other: None. CT MAXILLOFACIAL FINDINGS Osseous:  Lower frontal bones are intact and normally aligned. No displaced nasal bone fracture. Osseous structures about the orbits are intact and normally aligned bilaterally. Walls of the maxillary sinuses are intact and normally aligned bilaterally. Bilateral zygoma and pterygoid plates are intact. No mandible fracture  or displacement seen. Incidental note made of slight anterior subluxation of the left mandibular condyle relative to the condylar fossa, likely related to chronic degenerative change at the TMJ. Orbits: Orbital globes appear intact and symmetric in position. No retro-orbital hemorrhage or edema. Sinuses: Clear Soft tissues: Soft tissue edema overlying the nasal bones and upper lip. No circumscribed soft tissue hematoma appreciated. CT CERVICAL SPINE FINDINGS Alignment: Straightening of the normal cervical spine lordosis, likely related to patient positioning or muscle spasm. No evidence of acute vertebral body subluxation. Skull base and vertebrae: No fracture line or displaced fracture fragment. Facet joints appear intact and normally aligned throughout. Soft tissues and spinal canal: No prevertebral fluid or swelling. No visible canal hematoma. Disc levels: Mild disc desiccations at the C5-6 and C6-7 levels, with associated mild disc space narrowings and mild osseous spurring. No more than mild central canal stenosis at any level. No large disc bulge or protrusion seen. Upper chest: Negative. Other: None IMPRESSION: 1. Negative head CT. No intracranial mass, hemorrhage or edema. No skull fracture. 2. No facial bone fracture or dislocation. Soft tissue edema overlying the nasal bones and upper lip. 3. No fracture or acute subluxation within the cervical spine. Mild degenerative change within the lower cervical spine. Electronically Signed   By: Franki Cabot M.D.   On: 10/20/2017 16:34    EKG: Independently reviewed.  Normal sinus rhythm with T wave inversions in the lateral leads which are stable from  prior.   Assessment/Plan Active Problems:   Acute renal failure superimposed on stage 5 chronic kidney disease, not on chronic dialysis ([place])   Syncope, with an despite his negative orthostatic vital signs in the emergency department I still suspect that he is having some orthostatic changes related to his increased diuretics over the last few weeks.  His rise in his BUN and creatinine and his decreased shortness of breath and lack of swelling suggests that he is potentially dry.  He has not had any unusual arrhythmias on telemetry and his EKG looks stable.  It is possible he had an arrhythmia related to his hyperkalemia and we will observe him on telemetry overnight.  Low suspicion for pulmonary embolism.  He recently had a echocardiogram on 09/27/2017 demonstrated normal systolic function with an ejection fraction of 33-29%, grade 2 diastolic dysfunction.  He did not have any significant valvular abnormalities at that time they would cause him to have syncope.  He does however have severe LVH which may increase his risk of arrhythmia. - Telemetry  - IV fluids - repeat troponin in AM  - We will not repeat echocardiogram as this was recently done - Low suspicion for carotid artery disease given his age  AKI on CKD V.   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), possible hypoglycemic events recently despite reducing his insulin, probably related to his worsening renal function - Reduce long-acting insulin by 50% -Moderate dose sliding scale insulin with at bedtime insulin  Anemia of renal disease and iron deficiency -Continue iron supplementation  -No need for blood transfusion  -Unclear if he recently had a erythropoietin, will ask nephrology to review the records that he may benefit from a dose during this  hospitalization  HTN, mildly elevated -Continue labetalol, Norvasc, hydralazine  DVT prophylaxis: Heparin Code Status: Full code Family Communication:  Patient and his wife, and parents who are present at bedside Disposition Plan: Likely to home Consults called: None.  Call nephrology in the morning for consultation.  He does not need urgent hemodialysis overnight. Admission status: Inpatient as I expect he will be here for several days to correcting his acute renal failure and if he does not have improvement with IV fluids he may need to start hemodialysis during this hospitalization.  Janece Canterbury MD [facility] Pager [phone]  If 7PM-7AM, please contact night-coverage [email or web address] Password [facility]  10/20/2017, 7:34 PM

## 2017-10-20 NOTE — Progress Notes (Signed)
Messaged [facility] to make aware that pt has arrived on the floor. Needing orders.   Eleanora Neighbor, RN

## 2017-10-20 NOTE — ED Notes (Signed)
Pt's CBG 86. Nurse was notified.

## 2017-10-20 NOTE — Progress Notes (Signed)
New Admission Note:  Arrival Method: By bed from ED around 2000 Mental Orientation: Alert and oriented x 4 Telemetry: Box 5, CCMD notified Assessment: Completed Skin: Completed, refer to flowsheets IV: Left AC Pain: Denies Tubes: None Safety Measures: Safety Fall Prevention Plan was given, discussed Admission: Completed [facility] Orientation: Patient has been orientated to the room, unit and the staff. Family: Mother and father at bedside  Orders have been reviewed and implemented. Will continue to monitor the patient. Call light has been placed within reach and bed alarm has been activated.   Perry Mount, RN  Phone Number: [phone]

## 2017-10-20 NOTE — ED Notes (Signed)
Patient transported to X-ray 

## 2017-10-20 NOTE — ED Notes (Signed)
Pt returned from imaging.

## 2017-10-20 NOTE — ED Provider Notes (Signed)
[facility] EMERGENCY DEPARTMENT Provider Note   CSN: [id number] Arrival date & time: 10/20/17  1436     History   Chief Complaint Chief Complaint  Patient presents with  . Loss of Consciousness    HPI Hector Mahoney is a 47 y.o. male.  HPI 47 year old AA male past medical history significant for CHF, diabetes, hypertension, hyperlipidemia, CKD stage V with pending hemodialysis presents to the emergency department today for evaluation of syncope.  Patient states that while at church today he became very dizzy.  States over the past 3-4 days he has been having episodes of dizziness.  Described as room spinning sensation.  Not provoked by standing.  Patient also reports some prodromal symptoms today including tunnel vision, lightheadedness.  Patient went to get into the car this afternoon and had a complete syncopal episode.  This was witnessed by family.  Patient fell forward striking his head on the concrete.  Patient has abrasions to his lip, nose and face.  Patient family states that he was out for approximately 2-3 minutes.  When patient came to he had no recollection of the events.  They deny any seizure-like activity.  Patient does state that he did have one episode of emesis prior to LOC.  Patient denies being on blood thinners.  States that his tetanus shot is up-to-date.  Does report he was seen 3 weeks ago at [facility] where he was admitted for CHF exacerbation and acute kidney injury.  Patient is currently followed by nephrology for hemodialysis the next 2-3 months.  At this time patient complains of some pain to the face but denies any lightheadedness or dizziness.  Denies any headache or vision changes.  Patient does report some shortness of breath that has been ongoing over the past few weeks since being admitted to the hospital.  States that he has slowly improved.  Denies any chest pain.  Denies any palpitations prior to the loss of consciousness.  Pt  denies any fever, chill, ha, vision changes, ongestion, neck pain, cp, sob, cough, abd pain, n/v/d, urinary symptoms, change in bowel habits, melena, hematochezia, lower extremity paresthesias.  Past Medical History:  Diagnosis Date  . ABSCESS, TOOTH 04/23/2009  . AKI (acute kidney injury) ([place]) 08/2015  . CHF (congestive heart failure) ([place])   . DIABETES MELLITUS, TYPE I 07/30/2007  . Esophageal reflux 04/27/2009  . FATIGUE 04/27/2009  . HYPERLIPIDEMIA 04/24/2007  . HYPERTENSION 04/24/2007  . INSOMNIA-SLEEP DISORDER-UNSPEC 04/27/2009  . LUMBAR STRAIN, ACUTE 12/11/2008  . RASH-NONVESICULAR 03/11/2008  . Shortness of breath    "lying down and w/exertion right now" (12/22/2012)    Patient Active Problem List   Diagnosis Date Noted  . CKD stage 5 due to type 1 diabetes mellitus ([place]) 09/27/2017  . Low back pain 03/29/2017  . Nocturnal leg cramps 04/03/2016  . Hypertensive urgency 09/02/2015  . Acute on chronic diastolic heart failure ([place]) 09/02/2015  . Patient nonadherence 09/02/2015  . Dyspnea 12/22/2012  . Insulin dependent diabetes mellitus with complications ([place]) 76/28/3151  . Preventative health care 04/01/2011  . Esophageal reflux 04/27/2009  . INSOMNIA-SLEEP DISORDER-UNSPEC 04/27/2009  . DIABETES MELLITUS, TYPE I 07/30/2007  . Dyslipidemia 04/24/2007  . Essential hypertension 04/24/2007    Past Surgical History:  Procedure Laterality Date  . MOUTH SURGERY         Home Medications    Prior to Admission medications   Medication Sig Start Date End Date Taking? Authorizing Provider  amLODipine (NORVASC) 5 MG  tablet Take 1 tablet (5 mg total) by mouth daily. 09/05/17   Marrian Salvage, FNP  aspirin EC 81 MG EC tablet Take 1 tablet (81 mg total) by mouth daily. 09/06/15   Nita Sells, MD  calcitRIOL (ROCALTROL) 0.25 MCG capsule Take 1 capsule (0.25 mcg total) by mouth every Monday, Wednesday, and Friday at 6 PM. 09/30/17   Thurnell Lose, MD  cyclobenzaprine  (FLEXERIL) 10 MG tablet Take 1 tablet (10 mg total) by mouth at bedtime as needed for muscle spasms. 10/07/17   Biagio Borg, MD  ferrous sulfate 325 (65 FE) MG tablet Take 1 tablet (325 mg total) by mouth 2 (two) times daily with a meal. 09/28/17   Thurnell Lose, MD  furosemide (LASIX) 80 MG tablet Take 1 tablet (80 mg total) by mouth daily. 09/29/17   Thurnell Lose, MD  hydrALAZINE (APRESOLINE) 25 MG tablet Take 1 tablet (25 mg total) by mouth 3 (three) times daily. 09/28/17   Thurnell Lose, MD  insulin aspart (NOVOLOG) 100 UNIT/ML injection Inject 8-12 Units into the skin 3 (three) times daily with meals. Patient taking differently: Inject 20 Units into the skin 3 (three) times daily with meals.  04/25/16   Philemon Kingdom, MD  insulin glargine (LANTUS) 100 UNIT/ML injection Inject 0.4 mLs (40 Units total) into the skin at bedtime. 04/25/16   Philemon Kingdom, MD  Insulin Syringe-Needle U-100 (RELION INSULIN SYRINGE) 31G X 15/64" 0.5 ML MISC Use 4 times daily. 04/26/16   Philemon Kingdom, MD  isosorbide mononitrate (IMDUR) 30 MG 24 hr tablet TAKE ONE-HALF TABLET BY MOUTH ONCE DAILY 10/12/16   Biagio Borg, MD  labetalol (NORMODYNE) 200 MG tablet Take 1 tablet (200 mg total) by mouth 2 (two) times daily. 09/28/17   Thurnell Lose, MD  Magnesium Carbonate POWD by Does not apply route 2 (two) times daily.    [provider]  metolazone (ZAROXOLYN) 5 MG tablet Take 1 tablet (5 mg total) by mouth daily. 10/07/17   Biagio Borg, MD  rOPINIRole (REQUIP) 0.25 MG tablet TAKE 1 TABLET BY MOUTH AT BEDTIME 11/22/16   Biagio Borg, MD  rosuvastatin (CRESTOR) 20 MG tablet TAKE 1 TABLET BY MOUTH ONCE DAILY 08/21/17   Skeet Latch, MD  traMADol (ULTRAM) 50 MG tablet Take 1 tablet (50 mg total) by mouth every 8 (eight) hours as needed. Patient taking differently: Take 50 mg by mouth every 8 (eight) hours as needed for moderate pain.  03/29/17   Biagio Borg, MD    Family History Family  History  Problem Relation Age of Onset  . Hypertension Mother     Social History Social History   Tobacco Use  . Smoking status: Former Smoker    Types: Cigarettes    Last attempt to quit: 05/03/2015    Years since quitting: 2.4  . Smokeless tobacco: Never Used  . Tobacco comment: 12/22/2012 "used to smoke cigarettes once or twice/month; haven't had any cigarettes for years"  Substance Use Topics  . Alcohol use: Yes    Comment: occ  . Drug use: No     Allergies   Patient has no known allergies.   Review of Systems Review of Systems  All other systems reviewed and are negative.    Physical Exam Updated Vital Signs BP (!) 141/87   Pulse 87   Temp 97.6 F (36.4 C) (Oral)   Resp 13   SpO2 100%   Physical Exam  Constitutional: He  is oriented to person, place, and time. He appears well-developed and well-nourished.  Non-toxic appearance. No distress.  HENT:  Head: Normocephalic.  Nose: Nose normal.  Mouth/Throat: Oropharynx is clear and moist.  No bilateral hemotympanum.  No septal hematoma.  No raccoon eyes or battle sign.  Patient does have abrasion to the left forehead bleeding controlled.  He also has a small 1 cm ear laceration to the bridge of the nose with bleeding noted.  Patient also has abrasion to the inner lip where he hit his lip on his bottom teeth.  There is no gaping wound.  Bleeding is controlled.  Dentition is intact.  Oropharynx is clear.  Eyes: Conjunctivae are normal. Pupils are equal, round, and reactive to light. Right eye exhibits no discharge. Left eye exhibits no discharge.  Neck: Normal range of motion. Neck supple. No JVD present. No tracheal deviation present.  No c spine midline tenderness. No paraspinal tenderness. No deformities or step offs noted. Full ROM. Supple. No nuchal rigidity.    Cardiovascular: Normal rate, regular rhythm, normal heart sounds and intact distal pulses. Exam reveals no gallop and no friction rub.  No murmur  heard. Pulmonary/Chest: Effort normal and breath sounds normal. No stridor. No respiratory distress. He has no wheezes. He has no rales. He exhibits no tenderness.  No hypoxia or tachypnea.  Abdominal: Soft. Bowel sounds are normal. He exhibits no distension. There is no tenderness. There is no rebound and no guarding.  Musculoskeletal: Normal range of motion. He exhibits no tenderness.  No lower extremity edema or calf tenderness.  No midline T spine or L spine tenderness. No deformities or step offs noted. Full ROM. Pelvis is stable.   Lymphadenopathy:    He has no cervical adenopathy.  Neurological: He is alert and oriented to person, place, and time.  The patient is alert, attentive, and oriented x 3. Speech is clear. Cranial nerve II-VII grossly intact. Negative pronator drift. Sensation intact. Strength 5/5 in all extremities. Reflexes 2+ and symmetric at biceps, triceps, knees, and ankles. Rapid alternating movement and fine finger movements intact.    Skin: Skin is warm and dry. Capillary refill takes less than 2 seconds. No rash noted. He is not diaphoretic.  Psychiatric: His behavior is normal. Judgment and thought content normal.  Nursing note and vitals reviewed.    ED Treatments / Results  Labs (all labs ordered are listed, but only abnormal results are displayed) Labs Reviewed  CBC WITH DIFFERENTIAL/PLATELET - Abnormal; Notable for the following components:      Result Value   RBC 3.38 (*)    Hemoglobin 7.9 (*)    HCT 25.6 (*)    MCV 75.7 (*)    MCH 23.4 (*)    RDW 16.6 (*)    Neutro Abs 8.0 (*)    All other components within normal limits  COMPREHENSIVE METABOLIC PANEL - Abnormal; Notable for the following components:   Sodium 134 (*)    Potassium 5.6 (*)    Chloride 97 (*)    BUN 133 (*)    Creatinine, Ser 12.59 (*)    Calcium 8.1 (*)    Albumin 3.2 (*)    AST 45 (*)    GFR calc non Af Amer 4 (*)    GFR calc Af Amer 5 (*)    All other components within  normal limits  MAGNESIUM  CBG MONITORING, ED  I-STAT TROPONIN, ED    EKG  EKG Interpretation  Date/Time:  Sunday October 20 2017 14:45:56 EDT Ventricular Rate:  88 PR Interval:    QRS Duration: 95 QT Interval:  404 QTC Calculation: 489 R Axis:   3 Text Interpretation:  Sinus rhythm Prolonged PR interval Probable LVH with secondary repol abnrm Borderline prolonged QT interval similar to Sep 27 2017 with longer QT Confirmed by Merrily Pew [phone]) on 10/20/2017 3:24:53 PM       Radiology Dg Chest 2 View  Result Date: 10/20/2017 CLINICAL DATA:  Recent syncopal episode EXAM: CHEST - 2 VIEW COMPARISON:  09/27/2017 FINDINGS: Cardiac shadow is accentuated by the portable technique but stable. The lungs are well aerated bilaterally. Minimal posterior costophrenic angle atelectasis is noted bilaterally. No sizable effusion is seen. No bony abnormality is noted. IMPRESSION: Minimal basilar atelectasis. Electronically Signed   By: Inez Catalina M.D.   On: 10/20/2017 16:01   Ct Head Wo Contrast  Result Date: 10/20/2017 CLINICAL DATA:  Dizzy, fell onto concrete. Injury to face. Abrasion to forehead. Pain and swelling to nose and upper lip. Loss of consciousness. EXAM: CT HEAD WITHOUT CONTRAST CT MAXILLOFACIAL WITHOUT CONTRAST CT CERVICAL SPINE WITHOUT CONTRAST TECHNIQUE: Multidetector CT imaging of the head, cervical spine, and maxillofacial structures were performed using the standard protocol without intravenous contrast. Multiplanar CT image reconstructions of the cervical spine and maxillofacial structures were also generated. COMPARISON:  Head CT dated 09/01/2005 FINDINGS: CT HEAD FINDINGS Brain: Ventricles are normal in size and configuration. All areas of the brain demonstrate normal gray-white matter differentiation. There is no mass, hemorrhage, edema or other evidence of acute parenchymal abnormality. No extra-axial hemorrhage. Vascular: There are chronic calcified atherosclerotic changes of  the large vessels at the skull base. No unexpected hyperdense vessel. Skull: Normal. Negative for fracture or focal lesion. Other: None. CT MAXILLOFACIAL FINDINGS Osseous: Lower frontal bones are intact and normally aligned. No displaced nasal bone fracture. Osseous structures about the orbits are intact and normally aligned bilaterally. Walls of the maxillary sinuses are intact and normally aligned bilaterally. Bilateral zygoma and pterygoid plates are intact. No mandible fracture or displacement seen. Incidental note made of slight anterior subluxation of the left mandibular condyle relative to the condylar fossa, likely related to chronic degenerative change at the TMJ. Orbits: Orbital globes appear intact and symmetric in position. No retro-orbital hemorrhage or edema. Sinuses: Clear Soft tissues: Soft tissue edema overlying the nasal bones and upper lip. No circumscribed soft tissue hematoma appreciated. CT CERVICAL SPINE FINDINGS Alignment: Straightening of the normal cervical spine lordosis, likely related to patient positioning or muscle spasm. No evidence of acute vertebral body subluxation. Skull base and vertebrae: No fracture line or displaced fracture fragment. Facet joints appear intact and normally aligned throughout. Soft tissues and spinal canal: No prevertebral fluid or swelling. No visible canal hematoma. Disc levels: Mild disc desiccations at the C5-6 and C6-7 levels, with associated mild disc space narrowings and mild osseous spurring. No more than mild central canal stenosis at any level. No large disc bulge or protrusion seen. Upper chest: Negative. Other: None IMPRESSION: 1. Negative head CT. No intracranial mass, hemorrhage or edema. No skull fracture. 2. No facial bone fracture or dislocation. Soft tissue edema overlying the nasal bones and upper lip. 3. No fracture or acute subluxation within the cervical spine. Mild degenerative change within the lower cervical spine. Electronically  Signed   By: Franki Cabot M.D.   On: 10/20/2017 16:34   Ct Cervical Spine Wo Contrast  Result Date: 10/20/2017 CLINICAL DATA:  Dizzy, fell onto concrete. Injury  to face. Abrasion to forehead. Pain and swelling to nose and upper lip. Loss of consciousness. EXAM: CT HEAD WITHOUT CONTRAST CT MAXILLOFACIAL WITHOUT CONTRAST CT CERVICAL SPINE WITHOUT CONTRAST TECHNIQUE: Multidetector CT imaging of the head, cervical spine, and maxillofacial structures were performed using the standard protocol without intravenous contrast. Multiplanar CT image reconstructions of the cervical spine and maxillofacial structures were also generated. COMPARISON:  Head CT dated 09/01/2005 FINDINGS: CT HEAD FINDINGS Brain: Ventricles are normal in size and configuration. All areas of the brain demonstrate normal gray-white matter differentiation. There is no mass, hemorrhage, edema or other evidence of acute parenchymal abnormality. No extra-axial hemorrhage. Vascular: There are chronic calcified atherosclerotic changes of the large vessels at the skull base. No unexpected hyperdense vessel. Skull: Normal. Negative for fracture or focal lesion. Other: None. CT MAXILLOFACIAL FINDINGS Osseous: Lower frontal bones are intact and normally aligned. No displaced nasal bone fracture. Osseous structures about the orbits are intact and normally aligned bilaterally. Walls of the maxillary sinuses are intact and normally aligned bilaterally. Bilateral zygoma and pterygoid plates are intact. No mandible fracture or displacement seen. Incidental note made of slight anterior subluxation of the left mandibular condyle relative to the condylar fossa, likely related to chronic degenerative change at the TMJ. Orbits: Orbital globes appear intact and symmetric in position. No retro-orbital hemorrhage or edema. Sinuses: Clear Soft tissues: Soft tissue edema overlying the nasal bones and upper lip. No circumscribed soft tissue hematoma appreciated. CT CERVICAL  SPINE FINDINGS Alignment: Straightening of the normal cervical spine lordosis, likely related to patient positioning or muscle spasm. No evidence of acute vertebral body subluxation. Skull base and vertebrae: No fracture line or displaced fracture fragment. Facet joints appear intact and normally aligned throughout. Soft tissues and spinal canal: No prevertebral fluid or swelling. No visible canal hematoma. Disc levels: Mild disc desiccations at the C5-6 and C6-7 levels, with associated mild disc space narrowings and mild osseous spurring. No more than mild central canal stenosis at any level. No large disc bulge or protrusion seen. Upper chest: Negative. Other: None IMPRESSION: 1. Negative head CT. No intracranial mass, hemorrhage or edema. No skull fracture. 2. No facial bone fracture or dislocation. Soft tissue edema overlying the nasal bones and upper lip. 3. No fracture or acute subluxation within the cervical spine. Mild degenerative change within the lower cervical spine. Electronically Signed   By: Franki Cabot M.D.   On: 10/20/2017 16:34   Ct Maxillofacial Wo Cm  Result Date: 10/20/2017 CLINICAL DATA:  Dizzy, fell onto concrete. Injury to face. Abrasion to forehead. Pain and swelling to nose and upper lip. Loss of consciousness. EXAM: CT HEAD WITHOUT CONTRAST CT MAXILLOFACIAL WITHOUT CONTRAST CT CERVICAL SPINE WITHOUT CONTRAST TECHNIQUE: Multidetector CT imaging of the head, cervical spine, and maxillofacial structures were performed using the standard protocol without intravenous contrast. Multiplanar CT image reconstructions of the cervical spine and maxillofacial structures were also generated. COMPARISON:  Head CT dated 09/01/2005 FINDINGS: CT HEAD FINDINGS Brain: Ventricles are normal in size and configuration. All areas of the brain demonstrate normal gray-white matter differentiation. There is no mass, hemorrhage, edema or other evidence of acute parenchymal abnormality. No extra-axial  hemorrhage. Vascular: There are chronic calcified atherosclerotic changes of the large vessels at the skull base. No unexpected hyperdense vessel. Skull: Normal. Negative for fracture or focal lesion. Other: None. CT MAXILLOFACIAL FINDINGS Osseous: Lower frontal bones are intact and normally aligned. No displaced nasal bone fracture. Osseous structures about the orbits are intact and normally  aligned bilaterally. Walls of the maxillary sinuses are intact and normally aligned bilaterally. Bilateral zygoma and pterygoid plates are intact. No mandible fracture or displacement seen. Incidental note made of slight anterior subluxation of the left mandibular condyle relative to the condylar fossa, likely related to chronic degenerative change at the TMJ. Orbits: Orbital globes appear intact and symmetric in position. No retro-orbital hemorrhage or edema. Sinuses: Clear Soft tissues: Soft tissue edema overlying the nasal bones and upper lip. No circumscribed soft tissue hematoma appreciated. CT CERVICAL SPINE FINDINGS Alignment: Straightening of the normal cervical spine lordosis, likely related to patient positioning or muscle spasm. No evidence of acute vertebral body subluxation. Skull base and vertebrae: No fracture line or displaced fracture fragment. Facet joints appear intact and normally aligned throughout. Soft tissues and spinal canal: No prevertebral fluid or swelling. No visible canal hematoma. Disc levels: Mild disc desiccations at the C5-6 and C6-7 levels, with associated mild disc space narrowings and mild osseous spurring. No more than mild central canal stenosis at any level. No large disc bulge or protrusion seen. Upper chest: Negative. Other: None IMPRESSION: 1. Negative head CT. No intracranial mass, hemorrhage or edema. No skull fracture. 2. No facial bone fracture or dislocation. Soft tissue edema overlying the nasal bones and upper lip. 3. No fracture or acute subluxation within the cervical spine.  Mild degenerative change within the lower cervical spine. Electronically Signed   By: Franki Cabot M.D.   On: 10/20/2017 16:34    Procedures .Marland KitchenLaceration Repair Date/Time: 10/20/2017 5:20 PM Performed by: Doristine Devoid, PA-C Authorized by: Doristine Devoid, PA-C   Consent:    Consent obtained:  Verbal   Consent given by:  Patient   Risks discussed:  Infection, need for additional repair, nerve damage, poor wound healing, poor cosmetic result, retained foreign body, tendon damage, vascular damage and pain   Alternatives discussed:  No treatment Anesthesia (see MAR for exact dosages):    Anesthesia method:  Local infiltration   Local anesthetic:  Lidocaine 1% w/o epi Laceration details:    Location:  Face   Face location:  Nose   Length (cm):  1 Repair type:    Repair type:  Simple Pre-procedure details:    Preparation:  Patient was prepped and draped in usual sterile fashion and imaging obtained to evaluate for foreign bodies Exploration:    Wound extent: no foreign bodies/material noted and no underlying fracture noted     Contaminated: no   Treatment:    Area cleansed with:  Betadine and saline   Amount of cleaning:  Standard   Irrigation solution:  Sterile saline   Visualized foreign bodies/material removed: no   Skin repair:    Repair method:  Sutures   Suture size:  6-0   Suture material:  Prolene   Number of sutures:  2 Approximation:    Approximation:  Close   Vermilion border: well-aligned   Post-procedure details:    Dressing:  Antibiotic ointment and adhesive bandage   Patient tolerance of procedure:  Tolerated well, no immediate complications   (including critical care time)  Medications Ordered in ED Medications  lidocaine (PF) (XYLOCAINE) 1 % injection (not administered)     Initial Impression / Assessment and Plan / ED Course  I have reviewed the triage vital signs and the nursing notes.  Pertinent labs & imaging results that were  available during my care of the patient were reviewed by me and considered in my medical decision making (see chart  for details).     Patient resents to the emergency department today following a syncopal episode.  Does report some dizziness over the past 2-3 days.  Reports an episode of emesis prior to LOC.  No seizure-like activity as reported by family.  Patient did hit the front of the face on the concrete causing abrasions to his nose and lip.  Shot is up-to-date.  Patient denies any symptoms at this time.  Vital signs are reassuring.  Patient afebrile, no tachycardia or hypotension is noted.  Lungs clear to auscultation bilaterally.  Heart regular rate and rhythm.  No focal abdominal tenderness to palpation.  Patient is neurovascularly intact.  No focal neuro deficit on exam.  Lab work reveals hemoglobin of 7.9 down from 8.61-month ago.  Baseline appears to be 11-12.  Patient also has a mild hyperkalemia of 5.6.  Creatinine elevated at 12.59 last creatinine was 8 1 month ago.  CBG is 86.  Trop is negative.  Magnesium is pending.  This chest x-ray is reviewed by myself shows no signs of focal infiltrate or pulmonary edema.  EKG shows no signs of ischemia.  QTC is on the high end of normal.  CT of head face and neck revealed no acute fractures or bleeding.  No signs of intrathoracic, intra-abdominal trauma.  Patient remains hemodynamically stable.  Patient did have a small laceration to the bridge of the nose.  Bleeding was not controlled with pressure.  2 sutures were placed after wound was cleaned.  Bleeding controlled.  The abrasions to the left show no gaping wound.  No indication for closure at this time.  Facial abrasions required no suturing.  Patient is high risk with the [facility] syncope rule.  Patient to be admitted to the hospital service for further workup.  Patient symptoms seem consistent related to his worsening kidney function.  Patient's hemoglobin slightly low from  baseline which may be because of his symptoms.  Type and screen ordered.  Will order blood at this time.  Will consult hospital service.  Spoke with hospital medicine who agrees to admission will place admission orders and see patient in the ED.  They asked that I add Kayexalate for patient given his mildly elevated potassium.  They will consult nephrology.    Final Clinical Impressions(s) / ED Diagnoses   Final diagnoses:  Syncope, unspecified syncope type  Acute renal failure, unspecified acute renal failure type ([place])  Hyperkalemia  Laceration of nose, initial encounter    ED Discharge Orders    None       Aaron Edelman 10/20/17 1909    Mesner, Corene Cornea, MD 10/22/17 0020

## 2017-10-21 DIAGNOSIS — D631 Anemia in chronic kidney disease: Secondary | ICD-10-CM | POA: Diagnosis not present

## 2017-10-21 DIAGNOSIS — N185 Chronic kidney disease, stage 5: Secondary | ICD-10-CM | POA: Diagnosis not present

## 2017-10-21 DIAGNOSIS — N179 Acute kidney failure, unspecified: Secondary | ICD-10-CM | POA: Diagnosis not present

## 2017-10-21 DIAGNOSIS — I132 Hypertensive heart and chronic kidney disease with heart failure and with stage 5 chronic kidney disease, or end stage renal disease: Secondary | ICD-10-CM | POA: Diagnosis not present

## 2017-10-21 DIAGNOSIS — I120 Hypertensive chronic kidney disease with stage 5 chronic kidney disease or end stage renal disease: Secondary | ICD-10-CM | POA: Diagnosis not present

## 2017-10-21 DIAGNOSIS — I5032 Chronic diastolic (congestive) heart failure: Secondary | ICD-10-CM | POA: Diagnosis not present

## 2017-10-21 DIAGNOSIS — R55 Syncope and collapse: Secondary | ICD-10-CM

## 2017-10-21 DIAGNOSIS — I43 Cardiomyopathy in diseases classified elsewhere: Secondary | ICD-10-CM | POA: Diagnosis not present

## 2017-10-21 LAB — PROTIME-INR
INR: 1.16
Prothrombin Time: 14.7 seconds (ref 11.4–15.2)

## 2017-10-21 LAB — CBC
HCT: 22.1 % — ABNORMAL LOW (ref 39.0–52.0)
Hemoglobin: 6.7 g/dL — CL (ref 13.0–17.0)
MCH: 22.7 pg — AB (ref 26.0–34.0)
MCHC: 30.3 g/dL (ref 30.0–36.0)
MCV: 74.9 fL — AB (ref 78.0–100.0)
PLATELETS: 267 10*3/uL (ref 150–400)
RBC: 2.95 MIL/uL — ABNORMAL LOW (ref 4.22–5.81)
RDW: 15.9 % — AB (ref 11.5–15.5)
WBC: 8.9 10*3/uL (ref 4.0–10.5)

## 2017-10-21 LAB — PREPARE RBC (CROSSMATCH)

## 2017-10-21 LAB — GLUCOSE, CAPILLARY
Glucose-Capillary: 191 mg/dL — ABNORMAL HIGH (ref 65–99)
Glucose-Capillary: 287 mg/dL — ABNORMAL HIGH (ref 65–99)
Glucose-Capillary: 198 mg/dL — ABNORMAL HIGH (ref 65–99)
Glucose-Capillary: 197 mg/dL — ABNORMAL HIGH (ref 65–99)

## 2017-10-21 LAB — RENAL FUNCTION PANEL
Albumin: 2.9 g/dL — ABNORMAL LOW (ref 3.5–5.0)
Anion gap: 16 — ABNORMAL HIGH (ref 5–15)
BUN: 136 mg/dL — AB (ref 6–20)
CHLORIDE: 98 mmol/L — AB (ref 101–111)
CO2: 20 mmol/L — AB (ref 22–32)
CREATININE: 12.69 mg/dL — AB (ref 0.61–1.24)
Calcium: 7.8 mg/dL — ABNORMAL LOW (ref 8.9–10.3)
GFR calc Af Amer: 5 mL/min — ABNORMAL LOW (ref >60)
GFR, EST NON AFRICAN AMERICAN: 4 mL/min — AB (ref >60)
Glucose, Bld: 199 mg/dL — ABNORMAL HIGH (ref 65–99)
Phosphorus: 8 mg/dL — ABNORMAL HIGH (ref 2.5–4.6)
Potassium: 4 mmol/L (ref 3.5–5.1)
Sodium: 134 mmol/L — ABNORMAL LOW (ref 135–145)

## 2017-10-21 LAB — IRON AND TIBC
Iron: 43 ug/dL — ABNORMAL LOW (ref 45–182)
Saturation Ratios: 14 % — ABNORMAL LOW (ref 17.9–39.5)
TIBC: 318 ug/dL (ref 250–450)
UIBC: 275 ug/dL

## 2017-10-21 LAB — FERRITIN: Ferritin: 188 ng/mL (ref 24–336)

## 2017-10-21 LAB — MAGNESIUM: Magnesium: 3 mg/dL — ABNORMAL HIGH (ref 1.7–2.4)

## 2017-10-21 MED ORDER — CALCIUM ACETATE (PHOS BINDER) 667 MG PO CAPS
1334.0000 mg | ORAL_CAPSULE | Freq: Three times a day (TID) | ORAL | Status: DC
  Administered 2017-10-21 – 2017-10-25 (×8): 1334 mg via ORAL
  Filled 2017-10-21 (×9): qty 2

## 2017-10-21 MED ORDER — DARBEPOETIN ALFA 200 MCG/0.4ML IJ SOSY
200.0000 ug | PREFILLED_SYRINGE | INTRAMUSCULAR | Status: DC
  Administered 2017-10-22: 200 ug via INTRAVENOUS
  Filled 2017-10-21: qty 0.4

## 2017-10-21 MED ORDER — INSULIN GLARGINE 100 UNIT/ML ~~LOC~~ SOLN
18.0000 [IU] | Freq: Every day | SUBCUTANEOUS | Status: DC
  Administered 2017-10-21: 18 [IU] via SUBCUTANEOUS
  Filled 2017-10-21 (×2): qty 0.18

## 2017-10-21 MED ORDER — SODIUM CHLORIDE 0.9 % IV SOLN: Freq: Once | INTRAVENOUS | Status: DC

## 2017-10-21 MED ORDER — VITAMIN D (ERGOCALCIFEROL) 1.25 MG (50000 UNIT) PO CAPS
50000.0000 [IU] | ORAL_CAPSULE | ORAL | Status: DC
  Administered 2017-10-21: 50000 [IU] via ORAL
  Filled 2017-10-21: qty 1

## 2017-10-21 MED ORDER — INSULIN ASPART 100 UNIT/ML ~~LOC~~ SOLN
5.0000 [IU] | Freq: Three times a day (TID) | SUBCUTANEOUS | Status: DC
  Administered 2017-10-21 – 2017-10-22 (×2): 5 [IU] via SUBCUTANEOUS

## 2017-10-21 MED ORDER — SODIUM CHLORIDE 0.9 % IV SOLN
250.0000 mg | Freq: Every day | INTRAVENOUS | Status: AC
  Administered 2017-10-21 – 2017-10-24 (×4): 250 mg via INTRAVENOUS
  Filled 2017-10-21 (×7): qty 20

## 2017-10-21 MED ORDER — AMLODIPINE BESYLATE 5 MG PO TABS
5.0000 mg | ORAL_TABLET | Freq: Every day | ORAL | Status: DC
  Administered 2017-10-22 – 2017-10-24 (×3): 5 mg via ORAL
  Filled 2017-10-21 (×3): qty 1

## 2017-10-21 NOTE — Progress Notes (Signed)
CRITICAL VALUE ALERT  Critical Value:  hgb 6.7   Date & Time Notied:  10/21/17 4782  Provider Notified: Dr. Jearld Adjutant  Orders Received/Actions taken: awaiting orders

## 2017-10-21 NOTE — Progress Notes (Signed)
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  He was recently admitted from 2/15-2/16 with dyspnea and he had his Lasix adjusted and he was eventually started on metolazone.  He has had decreased dyspnea over the last few weeks but has developed dizziness when ambulating.  He had an episode of syncope on the date of admission that resulted in a 3-minute loss of consciousness and some lacerations to his nose and lips.  His labs suggest worsening anemia and renal function.  He was mildly hyperkalemic but this was easily corrected with Kayexalate.  He was given IV fluids overnight but his BUN and creatinine have not budged much.  Transfusing 2 units of blood on 3/11 which may also improve his renal function some.  He is bordering on the need for hemodialysis however.  Urine output remains brisk.  Nephrology has been consulted.  Assessment & Plan:  Syncope,  likely orthostatic despite his negative orthostatic vital signs in the emergency department given history, recently increased diuretics plus worsening anemia.  -  Tele:  NSR -  Troponin neg -  IVF off this morning -  Transfuse blood -  PT evaluation tomorrow  AKI on CKD V.Creatinine was 8.86 3 weeks ago with a GFR of 7, BUN of 92.  Creatinine now 12 with BUN in 130s.   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), possible hypoglycemic events recently despite reducing  his insulin, probably related to his worsening renal function -  Increase lantus to 18 units -  Moderate dose sliding scale insulin with at bedtime insulin -  Increase standing aspart to 5 units AC  Anemia of renal disease and iron deficiency, hemoglobin trended down -Continue iron supplementation  - Transfuse 2 units PRBC -Unclear if he recently had a erythropoietin, will ask nephrology to review the records that he may benefit from a dose during this hospitalization  HTN, mildly elevated initially due to pain, now improved -Continue labetalol, Norvasc, hydralazine  Nasal laceration - 2 sutures placed on 3/10 which will need to be removed in 3-5 days, currently day 1  DVT prophylaxis: Heparin Code Status: Full code Family Communication:  Patient and his wife Disposition Plan: Likely to home when ready.     Consultants:   Nephrology  Procedures:  none  Antimicrobials:  Anti-infectives (From admission, onward)   None       Subjective:  Denies chest pain, SOB.  Has swelling of his left arm and left leg this morning.  He denies nausea, decreased appetite.  He made copious clear urine overnight.  Objective: Vitals:   10/21/17 0948 10/21/17 1131 10/21/17 1215 10/21/17 1235  BP: 128/70 124/72 111/66 114/79  Pulse: 92 96 87 81  Resp: 18 18 18 18   Temp: 98.4 F (36.9 C) 98.5 F (36.9 C) 98.6 F (37 C) 98.6 F (37 C)  TempSrc: Oral Oral Oral Oral  SpO2: 98% 98% 98% 100%  Intake/Output Summary (Last 24 hours) at 10/21/2017 1500 Last data filed at 10/21/2017 0900 Gross per 24 hour  Intake 1171.67 ml  Output 400 ml  Net 771.67 ml   There were no vitals filed for this visit.  Examination:  General exam:  Adult male.  No acute distress.  HEENT:  MMM.  Decreased swelling of his nose, sutures are intact.  Decreased swelling of lips as well.   Respiratory system: Clear to auscultation bilaterally Cardiovascular system: Regular rate and rhythm, normal S1/S2. No  murmurs, rubs, gallops or clicks.  Warm extremities Gastrointestinal system: Normal active bowel sounds, soft, nondistended, nontender. MSK:  Normal tone and bulk, minimal swelling of the left hand and barely perceivable swelling of the left lower extremity   Neuro:  Grossly intact    Data Reviewed: I have personally reviewed following labs and imaging studies  CBC: Recent Labs  Lab 10/20/17 1510 10/21/17 0703  WBC 10.4 8.9  NEUTROABS 8.0*  --   HGB 7.9* 6.7*  HCT 25.6* 22.1*  MCV 75.7* 74.9*  PLT 300 349   Basic Metabolic Panel: Recent Labs  Lab 10/20/17 1510 10/20/17 1549 10/21/17 0703  NA 134*  --  134*  K 5.6*  --  4.0  CL 97*  --  98*  CO2 22  --  20*  GLUCOSE 94  --  199*  BUN 133*  --  136*  CREATININE 12.59*  --  12.69*  CALCIUM 8.1*  --  7.8*  MG  --  3.8* 3.0*  PHOS  --   --  8.0*   GFR: Estimated Creatinine Clearance: 8 mL/min (A) (by C-G formula based on SCr of 12.69 mg/dL (H)). Liver Function Tests: Recent Labs  Lab 10/20/17 1510 10/21/17 0703  AST 45*  --   ALT 37  --   ALKPHOS 107  --   BILITOT 1.1  --   PROT 7.0  --   ALBUMIN 3.2* 2.9*   No results for input(s): LIPASE, AMYLASE in the last 168 hours. No results for input(s): AMMONIA in the last 168 hours. Coagulation Profile: Recent Labs  Lab 10/21/17 0703  INR 1.16   Cardiac Enzymes: No results for input(s): CKTOTAL, CKMB, CKMBINDEX, TROPONINI in the last 168 hours. BNP (last 3 results) No results for input(s): PROBNP in the last 8760 hours. HbA1C: No results for input(s): HGBA1C in the last 72 hours. CBG: Recent Labs  Lab 10/20/17 1458 10/20/17 1959 10/21/17 0736 10/21/17 1135  GLUCAP 86 269* 191* 287*   Lipid Profile: No results for input(s): CHOL, HDL, LDLCALC, TRIG, CHOLHDL, LDLDIRECT in the last 72 hours. Thyroid Function Tests: No results for input(s): TSH, T4TOTAL, FREET4, T3FREE, THYROIDAB in the last 72 hours. Anemia Panel: Recent Labs    10/21/17 0936    FERRITIN 188  TIBC 318  IRON 43*   Urine analysis:    Component Value Date/Time   COLORURINE YELLOW 06/02/2016 0811   APPEARANCEUR CLEAR 06/02/2016 0811   LABSPEC 1.014 06/02/2016 0811   PHURINE 6.0 06/02/2016 0811   GLUCOSEU 500 (A) 06/02/2016 0811   GLUCOSEU >=1000 04/27/2009 1531   HGBUR MODERATE (A) 06/02/2016 0811   HGBUR trace-intact 04/23/2009 0936   BILIRUBINUR NEGATIVE 06/02/2016 0811   KETONESUR NEGATIVE 06/02/2016 0811   PROTEINUR >300 (A) 06/02/2016 0811   UROBILINOGEN 0.2 03/29/2014 1729   NITRITE NEGATIVE 06/02/2016 0811   LEUKOCYTESUR NEGATIVE 06/02/2016 0811   Sepsis Labs: @LABRCNTIP (procalcitonin:4,lacticidven:4)  )No results found for this or any previous visit (from the  past 240 hour(s)).    Radiology Studies: Dg Chest 2 View  Result Date: 10/20/2017 CLINICAL DATA:  Recent syncopal episode EXAM: CHEST - 2 VIEW COMPARISON:  09/27/2017 FINDINGS: Cardiac shadow is accentuated by the portable technique but stable. The lungs are well aerated bilaterally. Minimal posterior costophrenic angle atelectasis is noted bilaterally. No sizable effusion is seen. No bony abnormality is noted. IMPRESSION: Minimal basilar atelectasis. Electronically Signed   By: Inez Catalina M.D.   On: 10/20/2017 16:01   Ct Head Wo Contrast  Result Date: 10/20/2017 CLINICAL DATA:  Dizzy, fell onto concrete. Injury to face. Abrasion to forehead. Pain and swelling to nose and upper lip. Loss of consciousness. EXAM: CT HEAD WITHOUT CONTRAST CT MAXILLOFACIAL WITHOUT CONTRAST CT CERVICAL SPINE WITHOUT CONTRAST TECHNIQUE: Multidetector CT imaging of the head, cervical spine, and maxillofacial structures were performed using the standard protocol without intravenous contrast. Multiplanar CT image reconstructions of the cervical spine and maxillofacial structures were also generated. COMPARISON:  Head CT dated 09/01/2005 FINDINGS: CT HEAD FINDINGS Brain: Ventricles are normal in size and configuration.  All areas of the brain demonstrate normal gray-white matter differentiation. There is no mass, hemorrhage, edema or other evidence of acute parenchymal abnormality. No extra-axial hemorrhage. Vascular: There are chronic calcified atherosclerotic changes of the large vessels at the skull base. No unexpected hyperdense vessel. Skull: Normal. Negative for fracture or focal lesion. Other: None. CT MAXILLOFACIAL FINDINGS Osseous: Lower frontal bones are intact and normally aligned. No displaced nasal bone fracture. Osseous structures about the orbits are intact and normally aligned bilaterally. Walls of the maxillary sinuses are intact and normally aligned bilaterally. Bilateral zygoma and pterygoid plates are intact. No mandible fracture or displacement seen. Incidental note made of slight anterior subluxation of the left mandibular condyle relative to the condylar fossa, likely related to chronic degenerative change at the TMJ. Orbits: Orbital globes appear intact and symmetric in position. No retro-orbital hemorrhage or edema. Sinuses: Clear Soft tissues: Soft tissue edema overlying the nasal bones and upper lip. No circumscribed soft tissue hematoma appreciated. CT CERVICAL SPINE FINDINGS Alignment: Straightening of the normal cervical spine lordosis, likely related to patient positioning or muscle spasm. No evidence of acute vertebral body subluxation. Skull base and vertebrae: No fracture line or displaced fracture fragment. Facet joints appear intact and normally aligned throughout. Soft tissues and spinal canal: No prevertebral fluid or swelling. No visible canal hematoma. Disc levels: Mild disc desiccations at the C5-6 and C6-7 levels, with associated mild disc space narrowings and mild osseous spurring. No more than mild central canal stenosis at any level. No large disc bulge or protrusion seen. Upper chest: Negative. Other: None IMPRESSION: 1. Negative head CT. No intracranial mass, hemorrhage or edema. No  skull fracture. 2. No facial bone fracture or dislocation. Soft tissue edema overlying the nasal bones and upper lip. 3. No fracture or acute subluxation within the cervical spine. Mild degenerative change within the lower cervical spine. Electronically Signed   By: Franki Cabot M.D.   On: 10/20/2017 16:34   Ct Cervical Spine Wo Contrast  Result Date: 10/20/2017 CLINICAL DATA:  Dizzy, fell onto concrete. Injury to face. Abrasion to forehead. Pain and swelling to nose and upper lip. Loss of consciousness. EXAM: CT HEAD WITHOUT CONTRAST CT MAXILLOFACIAL WITHOUT CONTRAST CT CERVICAL SPINE WITHOUT CONTRAST TECHNIQUE: Multidetector CT imaging of the head, cervical spine, and maxillofacial structures were performed using the standard protocol without intravenous contrast. Multiplanar CT image reconstructions of the cervical spine and maxillofacial structures  were also generated. COMPARISON:  Head CT dated 09/01/2005 FINDINGS: CT HEAD FINDINGS Brain: Ventricles are normal in size and configuration. All areas of the brain demonstrate normal gray-white matter differentiation. There is no mass, hemorrhage, edema or other evidence of acute parenchymal abnormality. No extra-axial hemorrhage. Vascular: There are chronic calcified atherosclerotic changes of the large vessels at the skull base. No unexpected hyperdense vessel. Skull: Normal. Negative for fracture or focal lesion. Other: None. CT MAXILLOFACIAL FINDINGS Osseous: Lower frontal bones are intact and normally aligned. No displaced nasal bone fracture. Osseous structures about the orbits are intact and normally aligned bilaterally. Walls of the maxillary sinuses are intact and normally aligned bilaterally. Bilateral zygoma and pterygoid plates are intact. No mandible fracture or displacement seen. Incidental note made of slight anterior subluxation of the left mandibular condyle relative to the condylar fossa, likely related to chronic degenerative change at the  TMJ. Orbits: Orbital globes appear intact and symmetric in position. No retro-orbital hemorrhage or edema. Sinuses: Clear Soft tissues: Soft tissue edema overlying the nasal bones and upper lip. No circumscribed soft tissue hematoma appreciated. CT CERVICAL SPINE FINDINGS Alignment: Straightening of the normal cervical spine lordosis, likely related to patient positioning or muscle spasm. No evidence of acute vertebral body subluxation. Skull base and vertebrae: No fracture line or displaced fracture fragment. Facet joints appear intact and normally aligned throughout. Soft tissues and spinal canal: No prevertebral fluid or swelling. No visible canal hematoma. Disc levels: Mild disc desiccations at the C5-6 and C6-7 levels, with associated mild disc space narrowings and mild osseous spurring. No more than mild central canal stenosis at any level. No large disc bulge or protrusion seen. Upper chest: Negative. Other: None IMPRESSION: 1. Negative head CT. No intracranial mass, hemorrhage or edema. No skull fracture. 2. No facial bone fracture or dislocation. Soft tissue edema overlying the nasal bones and upper lip. 3. No fracture or acute subluxation within the cervical spine. Mild degenerative change within the lower cervical spine. Electronically Signed   By: Franki Cabot M.D.   On: 10/20/2017 16:34   Ct Maxillofacial Wo Cm  Result Date: 10/20/2017 CLINICAL DATA:  Dizzy, fell onto concrete. Injury to face. Abrasion to forehead. Pain and swelling to nose and upper lip. Loss of consciousness. EXAM: CT HEAD WITHOUT CONTRAST CT MAXILLOFACIAL WITHOUT CONTRAST CT CERVICAL SPINE WITHOUT CONTRAST TECHNIQUE: Multidetector CT imaging of the head, cervical spine, and maxillofacial structures were performed using the standard protocol without intravenous contrast. Multiplanar CT image reconstructions of the cervical spine and maxillofacial structures were also generated. COMPARISON:  Head CT dated 09/01/2005 FINDINGS: CT  HEAD FINDINGS Brain: Ventricles are normal in size and configuration. All areas of the brain demonstrate normal gray-white matter differentiation. There is no mass, hemorrhage, edema or other evidence of acute parenchymal abnormality. No extra-axial hemorrhage. Vascular: There are chronic calcified atherosclerotic changes of the large vessels at the skull base. No unexpected hyperdense vessel. Skull: Normal. Negative for fracture or focal lesion. Other: None. CT MAXILLOFACIAL FINDINGS Osseous: Lower frontal bones are intact and normally aligned. No displaced nasal bone fracture. Osseous structures about the orbits are intact and normally aligned bilaterally. Walls of the maxillary sinuses are intact and normally aligned bilaterally. Bilateral zygoma and pterygoid plates are intact. No mandible fracture or displacement seen. Incidental note made of slight anterior subluxation of the left mandibular condyle relative to the condylar fossa, likely related to chronic degenerative change at the TMJ. Orbits: Orbital globes appear intact and symmetric in position. No  retro-orbital hemorrhage or edema. Sinuses: Clear Soft tissues: Soft tissue edema overlying the nasal bones and upper lip. No circumscribed soft tissue hematoma appreciated. CT CERVICAL SPINE FINDINGS Alignment: Straightening of the normal cervical spine lordosis, likely related to patient positioning or muscle spasm. No evidence of acute vertebral body subluxation. Skull base and vertebrae: No fracture line or displaced fracture fragment. Facet joints appear intact and normally aligned throughout. Soft tissues and spinal canal: No prevertebral fluid or swelling. No visible canal hematoma. Disc levels: Mild disc desiccations at the C5-6 and C6-7 levels, with associated mild disc space narrowings and mild osseous spurring. No more than mild central canal stenosis at any level. No large disc bulge or protrusion seen. Upper chest: Negative. Other: None IMPRESSION:  1. Negative head CT. No intracranial mass, hemorrhage or edema. No skull fracture. 2. No facial bone fracture or dislocation. Soft tissue edema overlying the nasal bones and upper lip. 3. No fracture or acute subluxation within the cervical spine. Mild degenerative change within the lower cervical spine. Electronically Signed   By: Franki Cabot M.D.   On: 10/20/2017 16:34     Scheduled Meds: . amLODipine  5 mg Oral Daily  . calcitRIOL  0.25 mcg Oral Q M,W,F-1800  . calcium acetate  1,334 mg Oral TID WC  . docusate sodium  100 mg Oral BID  . ferrous sulfate  325 mg Oral BID WC  . heparin  5,000 Units Subcutaneous Q8H  . hydrALAZINE  25 mg Oral TID  . insulin aspart  0-5 Units Subcutaneous QHS  . insulin aspart  0-9 Units Subcutaneous TID WC  . insulin aspart  3 Units Subcutaneous TID WC  . insulin glargine  16 Units Subcutaneous QHS  . isosorbide mononitrate  15 mg Oral Daily  . labetalol  200 mg Oral BID  . rOPINIRole  0.25 mg Oral QHS  . rosuvastatin  10 mg Oral QHS  . sodium chloride flush  3 mL Intravenous Q12H  . Vitamin D (Ergocalciferol)  50,000 Units Oral Q7 days   Continuous Infusions: . sodium chloride Stopped (10/21/17 1108)     LOS: 1 day    Time spent: 30 min    Janece Canterbury, MD [facility] Pager [phone]  If 7PM-7AM, please contact night-coverage [email or web address] Password [email or web address] 10/21/2017, 3:00 PM

## 2017-10-21 NOTE — Progress Notes (Signed)
hemocult slide positive.  Will notify MD>

## 2017-10-21 NOTE — Plan of Care (Signed)
  Progressing Education: Knowledge of General Education information will improve 10/21/2017 1646 - Progressing by Milderd Meager, RN Health Behavior/Discharge Planning: Ability to manage health-related needs will improve 10/21/2017 1646 - Progressing by Milderd Meager, RN Clinical Measurements: Ability to maintain clinical measurements within normal limits will improve 10/21/2017 1646 - Progressing by Milderd Meager, RN Will remain free from infection 10/21/2017 1646 - Progressing by Milderd Meager, RN Diagnostic test results will improve 10/21/2017 1646 - Progressing by Milderd Meager, RN Respiratory complications will improve 10/21/2017 1646 - Progressing by Milderd Meager, RN Cardiovascular complication will be avoided 10/21/2017 1646 - Progressing by Milderd Meager, RN Activity: Risk for activity intolerance will decrease 10/21/2017 1646 - Progressing by Milderd Meager, RN Nutrition: Adequate nutrition will be maintained 10/21/2017 1646 - Progressing by Milderd Meager, RN Coping: Level of anxiety will decrease 10/21/2017 1646 - Progressing by Milderd Meager, RN Elimination: Will not experience complications related to bowel motility 10/21/2017 1646 - Progressing by Milderd Meager, RN Will not experience complications related to urinary retention 10/21/2017 1646 - Progressing by Milderd Meager, RN Pain Managment: General experience of comfort will improve 10/21/2017 1646 - Progressing by Milderd Meager, RN Safety: Ability to remain free from injury will improve 10/21/2017 1646 - Progressing by Milderd Meager, RN Skin Integrity: Risk for impaired skin integrity will decrease 10/21/2017 1646 - Progressing by Milderd Meager, RN Activity: Ability to tolerate increased activity will improve 10/21/2017 1646 - Progressing by Milderd Meager, RN Education: Knowledge of disease and its progression will improve 10/21/2017 1646 -  Progressing by Milderd Meager, RN Coping: Development of coping mechanisms to deal with changes in body function or appearance will improve 10/21/2017 1646 - Progressing by Milderd Meager, RN Fluid Volume: Compliance with measures to maintain balanced fluid volume will improve 10/21/2017 1646 - Progressing by Milderd Meager, [place] Behavior/Discharge Planning: Ability to manage health-related needs will improve 10/21/2017 1646 - Progressing by Milderd Meager, RN Nutritional: Ability to make healthy dietary choices will improve 10/21/2017 1646 - Progressing by Milderd Meager, RN Physical Regulation: Complications related to the disease process, condition or treatment will be avoided or minimized 10/21/2017 1646 - Progressing by Milderd Meager, RN

## 2017-10-21 NOTE — Plan of Care (Signed)
  Progressing Education: Knowledge of General Education information will improve 10/21/2017 1934 - Progressing by Milderd Meager, RN 10/21/2017 1646 - Progressing by Milderd Meager, RN Health Behavior/Discharge Planning: Ability to manage health-related needs will improve 10/21/2017 1934 - Progressing by Milderd Meager, RN 10/21/2017 1646 - Progressing by Milderd Meager, RN Clinical Measurements: Ability to maintain clinical measurements within normal limits will improve 10/21/2017 1934 - Progressing by Milderd Meager, RN 10/21/2017 1646 - Progressing by Milderd Meager, RN Will remain free from infection 10/21/2017 1934 - Progressing by Milderd Meager, RN 10/21/2017 1646 - Progressing by Milderd Meager, RN Diagnostic test results will improve 10/21/2017 1934 - Progressing by Milderd Meager, RN 10/21/2017 1646 - Progressing by Milderd Meager, RN Respiratory complications will improve 10/21/2017 1934 - Progressing by Milderd Meager, RN 10/21/2017 1646 - Progressing by Milderd Meager, RN Cardiovascular complication will be avoided 10/21/2017 1934 - Progressing by Milderd Meager, RN 10/21/2017 1646 - Progressing by Milderd Meager, RN Activity: Risk for activity intolerance will decrease 10/21/2017 1934 - Progressing by Milderd Meager, RN 10/21/2017 1646 - Progressing by Milderd Meager, RN Nutrition: Adequate nutrition will be maintained 10/21/2017 1934 - Progressing by Milderd Meager, RN 10/21/2017 1646 - Progressing by Milderd Meager, RN Coping: Level of anxiety will decrease 10/21/2017 1934 - Progressing by Milderd Meager, RN 10/21/2017 1646 - Progressing by Milderd Meager, RN Elimination: Will not experience complications related to bowel motility 10/21/2017 1934 - Progressing by Milderd Meager, RN 10/21/2017 1646 - Progressing by Milderd Meager, RN Will not experience complications related to urinary  retention 10/21/2017 1934 - Progressing by Milderd Meager, RN 10/21/2017 1646 - Progressing by Milderd Meager, RN Pain Managment: General experience of comfort will improve 10/21/2017 1934 - Progressing by Milderd Meager, RN 10/21/2017 1646 - Progressing by Milderd Meager, RN Safety: Ability to remain free from injury will improve 10/21/2017 1934 - Progressing by Milderd Meager, RN 10/21/2017 1646 - Progressing by Milderd Meager, RN Skin Integrity: Risk for impaired skin integrity will decrease 10/21/2017 1934 - Progressing by Milderd Meager, RN 10/21/2017 1646 - Progressing by Milderd Meager, RN Activity: Ability to tolerate increased activity will improve 10/21/2017 1934 - Progressing by Milderd Meager, RN 10/21/2017 1646 - Progressing by Milderd Meager, RN Education: Knowledge of disease and its progression will improve 10/21/2017 1934 - Progressing by Milderd Meager, RN 10/21/2017 1646 - Progressing by Milderd Meager, RN Coping: Development of coping mechanisms to deal with changes in body function or appearance will improve 10/21/2017 1934 - Progressing by Milderd Meager, RN 10/21/2017 1646 - Progressing by Milderd Meager, RN Fluid Volume: Compliance with measures to maintain balanced fluid volume will improve 10/21/2017 1934 - Progressing by Milderd Meager, RN 10/21/2017 1646 - Progressing by Milderd Meager, RN Health Behavior/Discharge Planning: Ability to manage health-related needs will improve 10/21/2017 1934 - Progressing by Milderd Meager, RN 10/21/2017 1646 - Progressing by Milderd Meager, RN Nutritional: Ability to make healthy dietary choices will improve 10/21/2017 1934 - Progressing by Milderd Meager, RN 10/21/2017 1646 - Progressing by Milderd Meager, RN Physical Regulation: Complications related to the disease process, condition or treatment will be avoided or minimized 10/21/2017 1934 - Progressing  by Milderd Meager, RN 10/21/2017 1646 - Progressing by Milderd Meager, RN

## 2017-10-21 NOTE — Consult Note (Signed)
[facility] Renal Consultation Note  Requesting MD: Short Indication for Consultation: advanced CKD- now ESRD  HPI: Hector Mahoney is a 47 y.o. male with past medical history significant for diabetes mellitus, hypertension, hypertensive cardiomyopathy but with a normal ejection fraction.  He also has advanced CKD followed by Dr. Marval Regal at [facility].  He also has history of a previous wife being on dialysis before her passing.  He has had progression of CKD over the years.  Dialysis discussions have started.  In fact, he saw the nurse practitioner in our office on 3/5 for appointment.  Creatinine was 11, hemoglobin was in the eights with low iron stores and iron was arranged.  PTH was elevated and calcitriol was started.  The plan was that he was going to start peritoneal dialysis and that he did not appear uremic at that time on 3/5.  Patient presented to the emergency department after a syncopal episode after church on Sunday.  Labs now show GFR of around 4.  I talked to him today, telling him that I think it would do his body good to start dialysis and he is willing.  Unfortunately, we cant start PD in the hospital-he will need a PermCath and I recommended also a fistula, will need to start in center but then can transition to peritoneal dialysis.  The reason patient wants peritoneal dialysis is because he wants to continue to work at the [employer].  He has not had recurrence of dizziness upon standing.  Of note, he is on 4 blood pressure medications and recently had his diuretics increased  Creatinine, Ser  Date/Time Value Ref Range Status  10/21/2017 07:03 AM 12.69 (H) 0.61 - 1.24 mg/dL Final  10/20/2017 03:10 PM 12.59 (H) 0.61 - 1.24 mg/dL Final  09/28/2017 05:43 AM 9.23 (H) 0.61 - 1.24 mg/dL Final  09/27/2017 06:53 AM 8.86 (H) 0.61 - 1.24 mg/dL Final  06/02/2016 07:38 AM 3.73 (H) 0.61 - 1.24 mg/dL Final  05/01/2016 03:24 AM 3.67 (H) 0.61 - 1.24 mg/dL  Final  12/07/2015 09:24 AM 2.90 (H) 0.61 - 1.24 mg/dL Final  09/08/2015 05:28 PM 2.36 (H) 0.40 - 1.50 mg/dL Final  09/06/2015 10:50 AM 2.13 (H) 0.61 - 1.24 mg/dL Final  09/05/2015 03:43 AM 2.62 (H) 0.61 - 1.24 mg/dL Final  09/04/2015 03:58 AM 2.41 (H) 0.61 - 1.24 mg/dL Final  09/03/2015 03:22 AM 2.55 (H) 0.61 - 1.24 mg/dL Final  09/02/2015 05:33 AM 2.56 (H) 0.61 - 1.24 mg/dL Final  03/18/2015 07:30 AM 2.00 (H) 0.61 - 1.24 mg/dL Final  03/29/2014 04:00 PM 1.50 (H) 0.50 - 1.35 mg/dL Final  01/02/2013 04:27 PM 1.4 0.4 - 1.5 mg/dL Final  12/23/2012 05:00 AM 1.71 (H) 0.50 - 1.35 mg/dL Final  12/22/2012 07:45 AM 1.56 (H) 0.50 - 1.35 mg/dL Final  12/22/2012 01:28 AM 1.68 (H) 0.50 - 1.35 mg/dL Final  04/27/2009 03:31 PM 1.0 0.4 - 1.5 mg/dL Final     PMHx:   Past Medical History:  Diagnosis Date  . ABSCESS, TOOTH 04/23/2009  . AKI (acute kidney injury) ([place]) 08/2015  . CHF (congestive heart failure) ([place])   . DIABETES MELLITUS, TYPE I 07/30/2007  . Esophageal reflux 04/27/2009  . FATIGUE 04/27/2009  . HYPERLIPIDEMIA 04/24/2007  . HYPERTENSION 04/24/2007  . INSOMNIA-SLEEP DISORDER-UNSPEC 04/27/2009  . LUMBAR STRAIN, ACUTE 12/11/2008  . RASH-NONVESICULAR 03/11/2008  . Shortness of breath    "lying down and w/exertion right now" (12/22/2012)    Past Surgical History:  Procedure  Laterality Date  . MOUTH SURGERY      Family Hx:  Family History  Problem Relation Age of Onset  . Hypertension Mother   . Diabetes Mother   . Diabetes Maternal Aunt   . Kidney disease Neg Hx     Social History:  reports that he quit smoking about 2 years ago. His smoking use included cigarettes. he has never used smokeless tobacco. He reports that he drinks alcohol. He reports that he does not use drugs.  Allergies: No Known Allergies  Medications: Prior to Admission medications   Medication Sig Start Date End Date Taking? Authorizing Provider  amLODipine (NORVASC) 5 MG tablet Take 1 tablet (5 mg total) by  mouth daily. 09/05/17  Yes Marrian Salvage, FNP  aspirin EC 81 MG EC tablet Take 1 tablet (81 mg total) by mouth daily. 09/06/15  Yes Nita Sells, MD  calcitRIOL (ROCALTROL) 0.25 MCG capsule Take 1 capsule (0.25 mcg total) by mouth every Monday, Wednesday, and Friday at 6 PM. 09/30/17  Yes Thurnell Lose, MD  cyclobenzaprine (FLEXERIL) 10 MG tablet Take 1 tablet (10 mg total) by mouth at bedtime as needed for muscle spasms. 10/07/17  Yes Biagio Borg, MD  ferrous sulfate 325 (65 FE) MG tablet Take 1 tablet (325 mg total) by mouth 2 (two) times daily with a meal. 09/28/17  Yes Thurnell Lose, MD  furosemide (LASIX) 80 MG tablet Take 1 tablet (80 mg total) by mouth daily. 09/29/17  Yes Thurnell Lose, MD  hydrALAZINE (APRESOLINE) 25 MG tablet Take 1 tablet (25 mg total) by mouth 3 (three) times daily. 09/28/17  Yes Thurnell Lose, MD  insulin aspart (NOVOLOG) 100 UNIT/ML injection Inject 8-12 Units into the skin 3 (three) times daily with meals. Patient taking differently: Inject 8-12 Units into the skin 3 (three) times daily with meals. Blood sugar 120 = 2 units 130 - 10 units Higher than 130 =  12 units 04/25/16  Yes Philemon Kingdom, MD  insulin glargine (LANTUS) 100 UNIT/ML injection Inject 0.4 mLs (40 Units total) into the skin at bedtime. Patient taking differently: Inject 32 Units into the skin at bedtime.  04/25/16  Yes Philemon Kingdom, MD  isosorbide mononitrate (IMDUR) 30 MG 24 hr tablet TAKE ONE-HALF TABLET BY MOUTH ONCE DAILY Patient taking differently: TAKE 15 mg  TABLET BY MOUTH ONCE DAILY 10/12/16  Yes Biagio Borg, MD  labetalol (NORMODYNE) 200 MG tablet Take 1 tablet (200 mg total) by mouth 2 (two) times daily. 09/28/17  Yes Thurnell Lose, MD  metolazone (ZAROXOLYN) 5 MG tablet Take 1 tablet (5 mg total) by mouth daily. 10/07/17  Yes Biagio Borg, MD  rosuvastatin (CRESTOR) 20 MG tablet TAKE 1 TABLET BY MOUTH ONCE DAILY Patient taking differently: TAKE 120 mg  TABLET BY MOUTH ONCE in the evening 08/21/17  Yes Skeet Latch, MD  traMADol (ULTRAM) 50 MG tablet Take 1 tablet (50 mg total) by mouth every 8 (eight) hours as needed. Patient taking differently: Take 50 mg by mouth every 8 (eight) hours as needed for moderate pain.  03/29/17  Yes Biagio Borg, MD  rOPINIRole (REQUIP) 0.25 MG tablet TAKE 1 TABLET BY MOUTH AT BEDTIME 10/21/17   Biagio Borg, MD    I have reviewed the patient's current medications.  Labs:  Results for orders placed or performed during the hospital encounter of 10/20/17 (from the past 48 hour(s))  CBG monitoring, ED     Status: None  Collection Time: 10/20/17  2:58 PM  Result Value Ref Range   Glucose-Capillary 86 65 - 99 mg/dL  CBC WITH DIFFERENTIAL     Status: Abnormal   Collection Time: 10/20/17  3:10 PM  Result Value Ref Range   WBC 10.4 4.0 - 10.5 K/uL   RBC 3.38 (L) 4.22 - 5.81 MIL/uL   Hemoglobin 7.9 (L) 13.0 - 17.0 g/dL   HCT 25.6 (L) 39.0 - 52.0 %   MCV 75.7 (L) 78.0 - 100.0 fL   MCH 23.4 (L) 26.0 - 34.0 pg   MCHC 30.9 30.0 - 36.0 g/dL   RDW 16.6 (H) 11.5 - 15.5 %   Platelets 300 150 - 400 K/uL   Neutrophils Relative % 78 %   Neutro Abs 8.0 (H) 1.7 - 7.7 K/uL   Lymphocytes Relative 16 %   Lymphs Abs 1.7 0.7 - 4.0 K/uL   Monocytes Relative 3 %   Monocytes Absolute 0.3 0.1 - 1.0 K/uL   Eosinophils Relative 3 %   Eosinophils Absolute 0.4 0.0 - 0.7 K/uL   Basophils Relative 0 %   Basophils Absolute 0.0 0.0 - 0.1 K/uL    Comment: Performed at [facility], [address] [place], [place] [address]  Comprehensive metabolic panel     Status: Abnormal   Collection Time: 10/20/17  3:10 PM  Result Value Ref Range   Sodium 134 (L) 135 - 145 mmol/L   Potassium 5.6 (H) 3.5 - 5.1 mmol/L   Chloride 97 (L) 101 - 111 mmol/L   CO2 22 22 - 32 mmol/L   Glucose, Bld 94 65 - 99 mg/dL   BUN 133 (H) 6 - 20 mg/dL   Creatinine, Ser 12.59 (H) 0.61 - 1.24 mg/dL   Calcium 8.1 (L) 8.9 - 10.3 mg/dL   Total Protein  7.0 6.5 - 8.1 g/dL   Albumin 3.2 (L) 3.5 - 5.0 g/dL   AST 45 (H) 15 - 41 U/L   ALT 37 17 - 63 U/L   Alkaline Phosphatase 107 38 - 126 U/L   Total Bilirubin 1.1 0.3 - 1.2 mg/dL   GFR calc non Af Amer 4 (L) >60 mL/min   GFR calc Af Amer 5 (L) >60 mL/min    Comment: (NOTE) The eGFR has been calculated using the CKD EPI equation. This calculation has not been validated in all clinical situations. eGFR's persistently <60 mL/min signify possible Chronic Kidney Disease.    Anion gap 15 5 - 15    Comment: Performed at [facility] [address] [place], [place] [address]  Magnesium     Status: Abnormal   Collection Time: 10/20/17  3:49 PM  Result Value Ref Range   Magnesium 3.8 (H) 1.7 - 2.4 mg/dL    Comment: Performed at [facility] [address], [place], [place] [address]  I-stat troponin, ED     Status: None   Collection Time: 10/20/17  3:52 PM  Result Value Ref Range   Troponin i, poc 0.03 0.00 - 0.08 ng/mL   Comment 3            Comment: Due to the release kinetics of cTnI, a negative result within the first hours of the onset of symptoms does not rule out myocardial infarction with certainty. If myocardial infarction is still suspected, repeat the test at appropriate intervals.   Type and screen [place]     Status: None (Preliminary result)   Collection Time:  10/20/17  6:36 PM  Result Value Ref Range   ABO/RH(D) B POS    Antibody Screen NEG    Sample Expiration 10/23/2017    Unit Number [id number]    Blood Component Type RED CELLS,LR    Unit division 00    Status of Unit ALLOCATED    Transfusion Status OK TO TRANSFUSE    Crossmatch Result Compatible    Unit Number [id number]    Blood Component Type RED CELLS,LR    Unit division 00    Status of Unit ISSUED    Transfusion Status OK TO TRANSFUSE    Crossmatch Result      Compatible Performed at [facility], [place] [address], [place], [place] [address]   ABO/Rh      Status: None   Collection Time: 10/20/17  6:36 PM  Result Value Ref Range   ABO/RH(D)      B POS Performed at [facility] [address], [place], [place] [address]   Glucose, capillary     Status: Abnormal   Collection Time: 10/20/17  7:59 PM  Result Value Ref Range   Glucose-Capillary 269 (H) 65 - 99 mg/dL  Renal function panel     Status: Abnormal   Collection Time: 10/21/17  7:03 AM  Result Value Ref Range   Sodium 134 (L) 135 - 145 mmol/L   Potassium 4.0 3.5 - 5.1 mmol/L   Chloride 98 (L) 101 - 111 mmol/L   CO2 20 (L) 22 - 32 mmol/L   Glucose, Bld 199 (H) 65 - 99 mg/dL   BUN 136 (H) 6 - 20 mg/dL   Creatinine, Ser 12.69 (H) 0.61 - 1.24 mg/dL   Calcium 7.8 (L) 8.9 - 10.3 mg/dL   Phosphorus 8.0 (H) 2.5 - 4.6 mg/dL   Albumin 2.9 (L) 3.5 - 5.0 g/dL   GFR calc non Af Amer 4 (L) >60 mL/min   GFR calc Af Amer 5 (L) >60 mL/min    Comment: (NOTE) The eGFR has been calculated using the CKD EPI equation. This calculation has not been validated in all clinical situations. eGFR's persistently <60 mL/min signify possible Chronic Kidney Disease.    Anion gap 16 (H) 5 - 15    Comment: Performed at [facility], [place] [address], [place], [place] [address]  CBC     Status: Abnormal   Collection Time: 10/21/17  7:03 AM  Result Value Ref Range   WBC 8.9 4.0 - 10.5 K/uL   RBC 2.95 (L) 4.22 - 5.81 MIL/uL   Hemoglobin 6.7 (LL) 13.0 - 17.0 g/dL    Comment: SPECIMEN CHECKED FOR CLOTS REPEATED TO VERIFY CRITICAL RESULT CALLED TO, READ BACK BY AND VERIFIED WITH: A. RODRIGUEZ AT 0829 ON [id number] BY Renae Gloss. A. RODRIGUEZ RN    HCT 22.1 (L) 39.0 - 52.0 %   MCV 74.9 (L) 78.0 - 100.0 fL   MCH 22.7 (L) 26.0 - 34.0 pg   MCHC 30.3 30.0 - 36.0 g/dL   RDW 15.9 (H) 11.5 - 15.5 %   Platelets 267 150 - 400 K/uL    Comment: Performed at [facility] [address], [place], [place] [address]  Magnesium     Status: Abnormal   Collection Time: 10/21/17  7:03 AM  Result Value Ref  Range   Magnesium 3.0 (H) 1.7 - 2.4 mg/dL    Comment: Performed at [facility] [address], [place], [place] [address]  Protime-INR  Status: None   Collection Time: 10/21/17  7:03 AM  Result Value Ref Range   Prothrombin Time 14.7 11.4 - 15.2 seconds   INR 1.16     Comment: Performed at [facility], [place] [address], [place], [place] [address]  Glucose, capillary     Status: Abnormal   Collection Time: 10/21/17  7:36 AM  Result Value Ref Range   Glucose-Capillary 191 (H) 65 - 99 mg/dL  Ferritin     Status: None   Collection Time: 10/21/17  9:36 AM  Result Value Ref Range   Ferritin 188 24 - 336 ng/mL    Comment: Performed at [facility], [place] [address] [place], [place] [address]  Iron and TIBC     Status: Abnormal   Collection Time: 10/21/17  9:36 AM  Result Value Ref Range   Iron 43 (L) 45 - 182 ug/dL   TIBC 318 250 - 450 ug/dL   Saturation Ratios 14 (L) 17.9 - 39.5 %   UIBC 275 ug/dL    Comment: Performed at [facility], [place] [address] [place], [place] [address]  Prepare RBC     Status: None   Collection Time: 10/21/17  9:36 AM  Result Value Ref Range   Order Confirmation      ORDER PROCESSED BY BLOOD BANK Performed at [facility], [place] [address] [place], [place] [address]   Glucose, capillary     Status: Abnormal   Collection Time: 10/21/17 11:35 AM  Result Value Ref Range   Glucose-Capillary 287 (H) 65 - 99 mg/dL     ROS:  A comprehensive review of systems was negative except for: Constitutional: positive for fatigue Respiratory: positive for dyspnea on exertion Just general sense of not well-being  Physical Exam: Vitals:   10/21/17 1235 10/21/17 1500  BP: 114/79 120/68  Pulse: 81 86  Resp: 18 18  Temp: 98.6 F (37 C) 98.6 F (37 C)  SpO2: 100% 98%     General: Pleasant, black male, he is alert and oriented.  He has some abrasions on his face from the incident yesterday HEENT: Pupils are equally round and  reactive to light, external motions are intact, mucous membranes are moist Neck: No JVD appreciated Heart: Regular rate and rhythm Lungs: Mostly clear Abdomen: Obese, soft, nontender Extremities: Minimal peripheral edema  Skin: Warm and dry Neuro: Alert and nonfocal  Assessment/Plan: 47 year old black male with known advanced chronic kidney disease now with incident of syncope but with GFR discovered to be less than 5 1.Renal- he has not claimed uremic symptoms-but in talking to him he does have them.  He has been trying to be tough.  He now understands that he needs to start dialysis.  We will ask for PermCath placement with first treatment tomorrow.  Eventually he does want to do peritoneal dialysis.  I told him the recommendation would be for a backup fistula but he is concerned about how that will affect his work.  He did not call a consult yet I will talk to him again about it tomorrow 2. Hypertension/volume  -volume status seems pretty good.  I do think he had a change in blood pressure and that is what brought on his syncope.  I have chosen to discontinue his labetalol and hydralazine and only continue Norvasc.  As dialysis starts about his blood pressure will be better 3. Anemia  -advanced and worsening.  This is only contributing to his symptoms.  Hemoglobin  was in the eights last week and is now in the sixes.  I will give him IV iron and start ESA 4.  Bones-he is on calcitriol and PhosLo, those will be continued   Roizy Harold A 10/21/2017, 3:49 PM

## 2017-10-22 ENCOUNTER — Inpatient Hospital Stay (HOSPITAL_COMMUNITY): Payer: Federal, State, Local not specified - PPO

## 2017-10-22 ENCOUNTER — Other Ambulatory Visit: Payer: Self-pay

## 2017-10-22 ENCOUNTER — Encounter (HOSPITAL_COMMUNITY): Payer: Self-pay | Admitting: *Deleted

## 2017-10-22 DIAGNOSIS — I120 Hypertensive chronic kidney disease with stage 5 chronic kidney disease or end stage renal disease: Secondary | ICD-10-CM | POA: Diagnosis not present

## 2017-10-22 DIAGNOSIS — N185 Chronic kidney disease, stage 5: Secondary | ICD-10-CM | POA: Diagnosis not present

## 2017-10-22 DIAGNOSIS — N186 End stage renal disease: Secondary | ICD-10-CM | POA: Diagnosis not present

## 2017-10-22 DIAGNOSIS — Z4901 Encounter for fitting and adjustment of extracorporeal dialysis catheter: Secondary | ICD-10-CM | POA: Diagnosis not present

## 2017-10-22 DIAGNOSIS — D631 Anemia in chronic kidney disease: Secondary | ICD-10-CM | POA: Diagnosis not present

## 2017-10-22 HISTORY — PX: IR US GUIDE VASC ACCESS RIGHT: IMG2390

## 2017-10-22 HISTORY — PX: IR FLUORO GUIDE CV LINE RIGHT: IMG2283

## 2017-10-22 LAB — TYPE AND SCREEN
ABO/RH(D): B POS
Antibody Screen: NEGATIVE
Unit division: 0
Unit division: 0

## 2017-10-22 LAB — RENAL FUNCTION PANEL
Albumin: 3.2 g/dL — ABNORMAL LOW (ref 3.5–5.0)
Anion gap: 18 — ABNORMAL HIGH (ref 5–15)
BUN: 133 mg/dL — AB (ref 6–20)
CHLORIDE: 96 mmol/L — AB (ref 101–111)
CO2: 21 mmol/L — AB (ref 22–32)
CREATININE: 12.9 mg/dL — AB (ref 0.61–1.24)
Calcium: 7.9 mg/dL — ABNORMAL LOW (ref 8.9–10.3)
GFR calc Af Amer: 5 mL/min — ABNORMAL LOW (ref >60)
GFR calc non Af Amer: 4 mL/min — ABNORMAL LOW (ref >60)
Glucose, Bld: 208 mg/dL — ABNORMAL HIGH (ref 65–99)
Phosphorus: 7.8 mg/dL — ABNORMAL HIGH (ref 2.5–4.6)
Potassium: 3.8 mmol/L (ref 3.5–5.1)
Sodium: 135 mmol/L (ref 135–145)

## 2017-10-22 LAB — BPAM RBC
BLOOD PRODUCT EXPIRATION DATE: 201903182359
Blood Product Expiration Date: 201903182359
ISSUE DATE / TIME: 201903111632
ISSUE DATE / TIME: 201903111210
UNIT TYPE AND RH: 7300
Unit Type and Rh: 7300

## 2017-10-22 LAB — CBC
HCT: 29.2 % — ABNORMAL LOW (ref 39.0–52.0)
Hemoglobin: 9.3 g/dL — ABNORMAL LOW (ref 13.0–17.0)
MCH: 24.1 pg — AB (ref 26.0–34.0)
MCHC: 31.8 g/dL (ref 30.0–36.0)
MCV: 75.6 fL — AB (ref 78.0–100.0)
Platelets: 262 10*3/uL (ref 150–400)
RBC: 3.86 MIL/uL — ABNORMAL LOW (ref 4.22–5.81)
RDW: 15.8 % — AB (ref 11.5–15.5)
WBC: 11 10*3/uL — ABNORMAL HIGH (ref 4.0–10.5)

## 2017-10-22 LAB — PROTIME-INR
INR: 1.05
PROTHROMBIN TIME: 13.6 s (ref 11.4–15.2)

## 2017-10-22 LAB — ABO/RH: ABO/RH(D): B POS

## 2017-10-22 LAB — MAGNESIUM: Magnesium: 2.9 mg/dL — ABNORMAL HIGH (ref 1.7–2.4)

## 2017-10-22 LAB — GLUCOSE, CAPILLARY
GLUCOSE-CAPILLARY: 174 mg/dL — AB (ref 65–99)
Glucose-Capillary: 220 mg/dL — ABNORMAL HIGH (ref 65–99)
Glucose-Capillary: 117 mg/dL — ABNORMAL HIGH (ref 65–99)

## 2017-10-22 LAB — HEPATITIS B SURFACE ANTIGEN: HEP B S AG: NEGATIVE

## 2017-10-22 LAB — TROPONIN I: TROPONIN I: 0.03 ng/mL — AB (ref <0.03)

## 2017-10-22 MED ORDER — INSULIN ASPART 100 UNIT/ML ~~LOC~~ SOLN
6.0000 [IU] | Freq: Three times a day (TID) | SUBCUTANEOUS | Status: DC
  Administered 2017-10-23 – 2017-10-25 (×6): 6 [IU] via SUBCUTANEOUS

## 2017-10-22 MED ORDER — ALTEPLASE 2 MG IJ SOLR: 2.0000 mg | Freq: Once | INTRAMUSCULAR | Status: DC | PRN

## 2017-10-22 MED ORDER — MIDAZOLAM HCL 2 MG/2ML IJ SOLN
INTRAMUSCULAR | Status: AC
  Filled 2017-10-22: qty 2

## 2017-10-22 MED ORDER — CEFAZOLIN SODIUM-DEXTROSE 2-4 GM/100ML-% IV SOLN
INTRAVENOUS | Status: AC
  Administered 2017-10-22: 2000 mg
  Filled 2017-10-22: qty 100

## 2017-10-22 MED ORDER — DARBEPOETIN ALFA 200 MCG/0.4ML IJ SOSY
PREFILLED_SYRINGE | INTRAMUSCULAR | Status: AC
  Filled 2017-10-22: qty 0.4

## 2017-10-22 MED ORDER — LIDOCAINE-PRILOCAINE 2.5-2.5 % EX CREA: 1.0000 "application " | TOPICAL_CREAM | CUTANEOUS | Status: DC | PRN

## 2017-10-22 MED ORDER — HEPARIN SODIUM (PORCINE) 1000 UNIT/ML DIALYSIS: 20.0000 [IU]/kg | INTRAMUSCULAR | Status: DC | PRN

## 2017-10-22 MED ORDER — CEFAZOLIN SODIUM-DEXTROSE 2-4 GM/100ML-% IV SOLN
2.0000 g | INTRAVENOUS | Status: DC
  Filled 2017-10-22: qty 100

## 2017-10-22 MED ORDER — INSULIN GLARGINE 100 UNIT/ML ~~LOC~~ SOLN
22.0000 [IU] | Freq: Every day | SUBCUTANEOUS | Status: DC
  Administered 2017-10-22 – 2017-10-24 (×3): 22 [IU] via SUBCUTANEOUS
  Filled 2017-10-22 (×4): qty 0.22

## 2017-10-22 MED ORDER — LIDOCAINE HCL 1 % IJ SOLN
INTRAMUSCULAR | Status: AC
  Filled 2017-10-22: qty 20

## 2017-10-22 MED ORDER — FENTANYL CITRATE (PF) 100 MCG/2ML IJ SOLN
INTRAMUSCULAR | Status: AC
  Filled 2017-10-22: qty 2

## 2017-10-22 MED ORDER — PENTAFLUOROPROP-TETRAFLUOROETH EX AERO: 1.0000 "application " | INHALATION_SPRAY | CUTANEOUS | Status: DC | PRN

## 2017-10-22 MED ORDER — HEPARIN SODIUM (PORCINE) 1000 UNIT/ML DIALYSIS
20.0000 [IU]/kg | INTRAMUSCULAR | Status: DC | PRN
Start: 2017-10-22 — End: 2017-10-22

## 2017-10-22 MED ORDER — HEPARIN SODIUM (PORCINE) 5000 UNIT/ML IJ SOLN
5000.0000 [IU] | Freq: Three times a day (TID) | INTRAMUSCULAR | Status: DC
  Administered 2017-10-22 – 2017-10-24 (×4): 5000 [IU] via SUBCUTANEOUS
  Filled 2017-10-22 (×5): qty 1

## 2017-10-22 MED ORDER — HEPARIN SODIUM (PORCINE) 1000 UNIT/ML DIALYSIS: 1000.0000 [IU] | INTRAMUSCULAR | Status: DC | PRN

## 2017-10-22 MED ORDER — SODIUM CHLORIDE 0.9 % IV SOLN: 100.0000 mL | INTRAVENOUS | Status: DC | PRN

## 2017-10-22 MED ORDER — LIDOCAINE HCL (PF) 1 % IJ SOLN
INTRAMUSCULAR | Status: AC | PRN
  Administered 2017-10-22: 15 mL

## 2017-10-22 MED ORDER — HEPARIN SODIUM (PORCINE) 1000 UNIT/ML IJ SOLN
INTRAMUSCULAR | Status: AC
  Administered 2017-10-22: 2.8 [IU]
  Filled 2017-10-22: qty 1

## 2017-10-22 MED ORDER — FENTANYL CITRATE (PF) 100 MCG/2ML IJ SOLN
INTRAMUSCULAR | Status: AC | PRN
  Administered 2017-10-22: 50 ug via INTRAVENOUS

## 2017-10-22 MED ORDER — MIDAZOLAM HCL 2 MG/2ML IJ SOLN
INTRAMUSCULAR | Status: AC | PRN
  Administered 2017-10-22: 1 mg via INTRAVENOUS

## 2017-10-22 MED ORDER — LIDOCAINE HCL (PF) 1 % IJ SOLN: 5.0000 mL | INTRAMUSCULAR | Status: DC | PRN

## 2017-10-22 NOTE — Sedation Documentation (Signed)
Vital signs stable. 

## 2017-10-22 NOTE — Progress Notes (Signed)
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  He was recently admitted from 2/15-2/16 with dyspnea and he had his Lasix adjusted and he was eventually started on metolazone.  He has had decreased dyspnea over the last few weeks but has developed dizziness when ambulating.  He had an episode of syncope on the date of admission that resulted in a 3-minute loss of consciousness and some lacerations to his nose and lips.  His labs suggest worsening anemia and renal function.  He may also have had some hypotension related to his blood pressure medications which have since been discontinued.  He was mildly hyperkalemic but this was easily corrected with Kayexalate and he did not have peaked T-waves on ECG.  He was given IV fluids initially but his BUN and creatinine did not improve.  Nephrology has recommended starting HD and he has had tunneled HD catheter placed on 3/12.    He also became more anemic.  Transfused 2 units of blood on 3/11 for worsening anemia and nephrology has ordered IV iron and epo.  He was occult stool positive, but he had facial trauma with nose bleed at admission which may have caused a false positive result.  I think it is more likely that he is anemic secondary to his progressive renal disease.  He has had dark stools since starting iron.  I have stopped his enteric iron so we can monitor for melena more easily.  Consider PCP to repeat occult stool testing as outpatient and referral to GI if it is positive.    Assessment & Plan:  Syncope, likely orthostatic related to anemia, BP medications and recently increased diuretics -  Tele:  NSR, okay to d/c -  Troponin neg -  Transfused  blood on 3/11 -  PT evaluation  AKI on CKD V, now ESRD and starting HD.Creatinine was 8.86 3 weeks ago with a GFR of 7, BUN of 92.  Creatinine now 12 with BUN in 130s.   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), hypoglycemic events recently despite reducing his insulin, probably related to his worsening renal function -  Increase lantus to 22 units -  Continue Moderate dose sliding scale insulin with at bedtime insulin -  Increase standing aspart to 6 units AC  Anemia of renal disease and iron deficiency, hemoglobin trended down -  D/c enteric iron supplementation  - Transfused 2 units PRBC 3/11 with appropriate increase in hgb -  feraheme given 3/12, epo per nephrology  HTN, mildly elevated initially due to pain, now improved - d/c labetalol and hydralazine -  Continue norvasc  Nasal laceration - 2 sutures placed on 3/10 which will need to be removed in 3-5 days, currently day 2 -  Remove sutures sometime between Wed and Friday  DVT prophylaxis: Heparin Code Status: Full code Family Communication:  Patient and his mom Disposition Plan: possibly home late this week.  CLIPP process needs to be completed.  Getting first HD treatment on Tuesday so will probably have completed three treatments by Thursday or Friday.    Consultants:   Nephrology  IR  Procedures:  Tunneled HD catheter placement on 3/12  Antimicrobials:  Anti-infectives (From admission, onward)   Start     Dose/Rate Route Frequency Ordered Stop   10/22/17 0937  ceFAZolin (ANCEF) 2-4 GM/100ML-% IVPB    Comments:  Roma Kayser  : cabinet override      10/22/17 0937 10/22/17 1023       Subjective:  Denies further bleeding from  his lips or nose.  His lips have become less swollen.  He feels a little bit more Zaiya Annunziato of breath over the last 24 hours which made it difficult to sleep.  Objective: Vitals:   10/22/17 1005 10/22/17 1010 10/22/17 1015 10/22/17 1022  BP: (!) 157/93 (!) 143/88 (!) 151/92 (!) 142/95  Pulse: 95 91 94 94  Resp: 17 14 19 18   Temp:      TempSrc:      SpO2: 95% 92% 90% 92%  Weight:      Height:        Intake/Output Summary (Last 24 hours) at 10/22/2017 1520 Last data filed at 10/22/2017 1238 Gross per 24 hour  Intake 1657.5 ml  Output 0 ml  Net 1657.5 ml   Filed Weights   10/21/17 2137  Weight: 94.4 kg (208 lb 1.8 oz)    Examination:  General exam:  Adult male.  No acute distress.  HEENT:  MMM.  Nose appears much less swollen today.  His lips are also less swollen but he still has some crusted blood on the inside of his lips.  He has 2 sutures on the bridge of his nose.  Edges are well approximated, no induration, no discharge or erythema Respiratory system: Clear to auscultation bilaterally Cardiovascular system: Regular rate and rhythm, normal S1/S2. No murmurs, rubs, gallops or clicks.  Warm extremities.  Tunneled hemodialysis catheter placed in the right chest today. Gastrointestinal system: Normal active bowel sounds, soft, nondistended, nontender. MSK:  Normal tone and bulk, no lower extremity edema Neuro:  Grossly intact   Data Reviewed: I have personally reviewed following labs and imaging studies  CBC: Recent Labs  Lab 10/20/17 1510 10/21/17 0703 10/21/17 2339  WBC 10.4 8.9 11.0*  NEUTROABS 8.0*  --   --   HGB 7.9* 6.7* 9.3*  HCT 25.6* 22.1* 29.2*  MCV 75.7* 74.9* 75.6*  PLT 300 267 765   Basic Metabolic Panel: Recent Labs  Lab 10/20/17 1510 10/20/17 1549 10/21/17 0703 10/21/17 2339  NA 134*  --  134* 135  K 5.6*  --  4.0 3.8  CL 97*  --  98* 96*  CO2 22  --  20* 21*  GLUCOSE 94  --  199* 208*  BUN 133*  --  136* 133*  CREATININE 12.59*  --  12.69*  12.90*  CALCIUM 8.1*  --  7.8* 7.9*  MG  --  3.8* 3.0* 2.9*  PHOS  --   --  8.0* 7.8*   GFR: Estimated Creatinine Clearance: 7.9 mL/min (A) (by C-G formula based on SCr of 12.9 mg/dL (H)). Liver Function Tests: Recent Labs  Lab 10/20/17 1510 10/21/17 0703 10/21/17 2339  AST 45*  --   --   ALT 37  --   --   ALKPHOS 107  --   --   BILITOT 1.1  --   --   PROT 7.0  --   --  ALBUMIN 3.2* 2.9* 3.2*   No results for input(s): LIPASE, AMYLASE in the last 168 hours. No results for input(s): AMMONIA in the last 168 hours. Coagulation Profile: Recent Labs  Lab 10/21/17 0703 10/21/17 2339  INR 1.16 1.05   Cardiac Enzymes: Recent Labs  Lab 10/21/17 2348  TROPONINI 0.03*   BNP (last 3 results) No results for input(s): PROBNP in the last 8760 hours. HbA1C: No results for input(s): HGBA1C in the last 72 hours. CBG: Recent Labs  Lab 10/21/17 1135 10/21/17 1701 10/21/17 2136 10/22/17 0741 10/22/17 1148  GLUCAP 287* 198* 197* 174* 220*   Lipid Profile: No results for input(s): CHOL, HDL, LDLCALC, TRIG, CHOLHDL, LDLDIRECT in the last 72 hours. Thyroid Function Tests: No results for input(s): TSH, T4TOTAL, FREET4, T3FREE, THYROIDAB in the last 72 hours. Anemia Panel: Recent Labs    10/21/17 0936  FERRITIN 188  TIBC 318  IRON 43*   Urine analysis:    Component Value Date/Time   COLORURINE YELLOW 06/02/2016 0811   APPEARANCEUR CLEAR 06/02/2016 0811   LABSPEC 1.014 06/02/2016 0811   PHURINE 6.0 06/02/2016 0811   GLUCOSEU 500 (A) 06/02/2016 0811   GLUCOSEU >=1000 04/27/2009 1531   HGBUR MODERATE (A) 06/02/2016 0811   HGBUR trace-intact 04/23/2009 0936   BILIRUBINUR NEGATIVE 06/02/2016 0811   KETONESUR NEGATIVE 06/02/2016 0811   PROTEINUR >300 (A) 06/02/2016 0811   UROBILINOGEN 0.2 03/29/2014 1729   NITRITE NEGATIVE 06/02/2016 0811   LEUKOCYTESUR NEGATIVE 06/02/2016 0811   Sepsis Labs: @LABRCNTIP (procalcitonin:4,lacticidven:4)  )No results found for this or  any previous visit (from the past 240 hour(s)).    Radiology Studies: Dg Chest 2 View  Result Date: 10/20/2017 CLINICAL DATA:  Recent syncopal episode EXAM: CHEST - 2 VIEW COMPARISON:  09/27/2017 FINDINGS: Cardiac shadow is accentuated by the portable technique but stable. The lungs are well aerated bilaterally. Minimal posterior costophrenic angle atelectasis is noted bilaterally. No sizable effusion is seen. No bony abnormality is noted. IMPRESSION: Minimal basilar atelectasis. Electronically Signed   By: Inez Catalina M.D.   On: 10/20/2017 16:01   Ct Head Wo Contrast  Result Date: 10/20/2017 CLINICAL DATA:  Dizzy, fell onto concrete. Injury to face. Abrasion to forehead. Pain and swelling to nose and upper lip. Loss of consciousness. EXAM: CT HEAD WITHOUT CONTRAST CT MAXILLOFACIAL WITHOUT CONTRAST CT CERVICAL SPINE WITHOUT CONTRAST TECHNIQUE: Multidetector CT imaging of the head, cervical spine, and maxillofacial structures were performed using the standard protocol without intravenous contrast. Multiplanar CT image reconstructions of the cervical spine and maxillofacial structures were also generated. COMPARISON:  Head CT dated 09/01/2005 FINDINGS: CT HEAD FINDINGS Brain: Ventricles are normal in size and configuration. All areas of the brain demonstrate normal gray-white matter differentiation. There is no mass, hemorrhage, edema or other evidence of acute parenchymal abnormality. No extra-axial hemorrhage. Vascular: There are chronic calcified atherosclerotic changes of the large vessels at the skull base. No unexpected hyperdense vessel. Skull: Normal. Negative for fracture or focal lesion. Other: None. CT MAXILLOFACIAL FINDINGS Osseous: Lower frontal bones are intact and normally aligned. No displaced nasal bone fracture. Osseous structures about the orbits are intact and normally aligned bilaterally. Walls of the maxillary sinuses are intact and normally aligned bilaterally. Bilateral zygoma and  pterygoid plates are intact. No mandible fracture or displacement seen. Incidental note made of slight anterior subluxation of the left mandibular condyle relative to the condylar fossa, likely related to chronic degenerative change at the TMJ. Orbits: Orbital globes appear intact and symmetric in position. No  retro-orbital hemorrhage or edema. Sinuses: Clear Soft tissues: Soft tissue edema overlying the nasal bones and upper lip. No circumscribed soft tissue hematoma appreciated. CT CERVICAL SPINE FINDINGS Alignment: Straightening of the normal cervical spine lordosis, likely related to patient positioning or muscle spasm. No evidence of acute vertebral body subluxation. Skull base and vertebrae: No fracture line or displaced fracture fragment. Facet joints appear intact and normally aligned throughout. Soft tissues and spinal canal: No prevertebral fluid or swelling. No visible canal hematoma. Disc levels: Mild disc desiccations at the C5-6 and C6-7 levels, with associated mild disc space narrowings and mild osseous spurring. No more than mild central canal stenosis at any level. No large disc bulge or protrusion seen. Upper chest: Negative. Other: None IMPRESSION: 1. Negative head CT. No intracranial mass, hemorrhage or edema. No skull fracture. 2. No facial bone fracture or dislocation. Soft tissue edema overlying the nasal bones and upper lip. 3. No fracture or acute subluxation within the cervical spine. Mild degenerative change within the lower cervical spine. Electronically Signed   By: Franki Cabot M.D.   On: 10/20/2017 16:34   Ct Cervical Spine Wo Contrast  Result Date: 10/20/2017 CLINICAL DATA:  Dizzy, fell onto concrete. Injury to face. Abrasion to forehead. Pain and swelling to nose and upper lip. Loss of consciousness. EXAM: CT HEAD WITHOUT CONTRAST CT MAXILLOFACIAL WITHOUT CONTRAST CT CERVICAL SPINE WITHOUT CONTRAST TECHNIQUE: Multidetector CT imaging of the head, cervical spine, and  maxillofacial structures were performed using the standard protocol without intravenous contrast. Multiplanar CT image reconstructions of the cervical spine and maxillofacial structures were also generated. COMPARISON:  Head CT dated 09/01/2005 FINDINGS: CT HEAD FINDINGS Brain: Ventricles are normal in size and configuration. All areas of the brain demonstrate normal gray-white matter differentiation. There is no mass, hemorrhage, edema or other evidence of acute parenchymal abnormality. No extra-axial hemorrhage. Vascular: There are chronic calcified atherosclerotic changes of the large vessels at the skull base. No unexpected hyperdense vessel. Skull: Normal. Negative for fracture or focal lesion. Other: None. CT MAXILLOFACIAL FINDINGS Osseous: Lower frontal bones are intact and normally aligned. No displaced nasal bone fracture. Osseous structures about the orbits are intact and normally aligned bilaterally. Walls of the maxillary sinuses are intact and normally aligned bilaterally. Bilateral zygoma and pterygoid plates are intact. No mandible fracture or displacement seen. Incidental note made of slight anterior subluxation of the left mandibular condyle relative to the condylar fossa, likely related to chronic degenerative change at the TMJ. Orbits: Orbital globes appear intact and symmetric in position. No retro-orbital hemorrhage or edema. Sinuses: Clear Soft tissues: Soft tissue edema overlying the nasal bones and upper lip. No circumscribed soft tissue hematoma appreciated. CT CERVICAL SPINE FINDINGS Alignment: Straightening of the normal cervical spine lordosis, likely related to patient positioning or muscle spasm. No evidence of acute vertebral body subluxation. Skull base and vertebrae: No fracture line or displaced fracture fragment. Facet joints appear intact and normally aligned throughout. Soft tissues and spinal canal: No prevertebral fluid or swelling. No visible canal hematoma. Disc levels: Mild  disc desiccations at the C5-6 and C6-7 levels, with associated mild disc space narrowings and mild osseous spurring. No more than mild central canal stenosis at any level. No large disc bulge or protrusion seen. Upper chest: Negative. Other: None IMPRESSION: 1. Negative head CT. No intracranial mass, hemorrhage or edema. No skull fracture. 2. No facial bone fracture or dislocation. Soft tissue edema overlying the nasal bones and upper lip. 3. No fracture or acute  subluxation within the cervical spine. Mild degenerative change within the lower cervical spine. Electronically Signed   By: Franki Cabot M.D.   On: 10/20/2017 16:34   Ir Fluoro Guide Cv Line Right  Result Date: 10/22/2017 INDICATION: 47 year old male with a history end-stage renal disease EXAM: IMAGE GUIDED TUNNELED HEMODIALYSIS CATHETER MEDICATIONS: 2.0 g Ancef. The antibiotic was given in an appropriate time interval prior to skin puncture. ANESTHESIA/SEDATION: Versed 1.0 mg IV; Fentanyl 50 mcg IV; Moderate Sedation Time:  15 minutes The patient was continuously monitored during the procedure by the interventional radiology nurse under my direct supervision. FLUOROSCOPY TIME:  Fluoroscopy Time: 0 minutes 24 seconds (2.6 mGy). COMPLICATIONS: None PROCEDURE: The procedure, risks, benefits, and alternatives were explained to the patient. Questions regarding the procedure were encouraged and answered. The patient understands and consents to the procedure. The right neck and chest was prepped with chlorhexidine, and draped in the usual sterile fashion using maximum barrier technique (cap and mask, sterile gown, sterile gloves, large sterile sheet, hand hygiene and cutaneous antiseptic). Antibiotic prophylaxis was initiated with 2.0g Ancef administered IV within 1 hour prior to skin incision. Local anesthesia was attained by infiltration with 1% lidocaine without epinephrine. Ultrasound demonstrated patency of the right internal jugular vein, and this was  documented with an image. Under real-time ultrasound guidance, this vein was accessed with a 21 gauge micropuncture needle and image documentation was performed. A small dermatotomy was made at the access site with an 11 scalpel. A 0.018" wire was advanced into the SVC and the access needle exchanged for a 83F micropuncture vascular sheath. The 0.018" wire was then removed and a 0.035" wire advanced into the IVC. Upon withdrawal of the 018 wire, the wire was marked for appropriate length of the internal portion of the catheter. A 23 cm tip to cuff catheter was selected. The skin and subcutaneous tissues of the right chest wall were then generously infiltrated with 1% lidocaine without epinephrine from the chest wall to the puncture site at the right IJ. The hemodialysis catheter was then back tunneled from the right chest wall incision to the dermatotomy at the right neck. The venous access site was then serially dilated and a peel away vascular sheath placed over the wire. The wire was removed and the catheter was placed through the peel-away sheath. The catheter tip is positioned in the upper right atrium. This was documented with a spot image. Both ports of the hemodialysis catheter were then tested for excellent function. The ports were then locked with heparinized lock. The incision at the neck was then sealed with Dermabond. Patient tolerated the procedure well and remained hemodynamically stable throughout. No complications were encountered and no significant blood loss was encountered. Marland Kitchen FINDINGS: After catheter placement, the tip lies within the superior cavoatrial junction. The catheter aspirates and flushes normally and is ready for immediate use. IMPRESSION: Status post right IJ tunneled 23 cm HD catheter placement. Catheter ready for use. Signed, Dulcy Fanny. Earleen Newport, DO [facility] Radiology Electronically Signed   By: Corrie Mckusick D.O.   On: 10/22/2017 11:54    Ir US Guide Vasc Access Right  Result Date: 10/22/2017 INDICATION: 47 year old male with a history end-stage renal disease EXAM: IMAGE GUIDED TUNNELED HEMODIALYSIS CATHETER MEDICATIONS: 2.0 g Ancef. The antibiotic was given in an appropriate time interval prior to skin puncture. ANESTHESIA/SEDATION: Versed 1.0 mg IV; Fentanyl 50 mcg IV; Moderate Sedation Time:  15 minutes The patient was continuously monitored during the  procedure by the interventional radiology nurse under my direct supervision. FLUOROSCOPY TIME:  Fluoroscopy Time: 0 minutes 24 seconds (2.6 mGy). COMPLICATIONS: None PROCEDURE: The procedure, risks, benefits, and alternatives were explained to the patient. Questions regarding the procedure were encouraged and answered. The patient understands and consents to the procedure. The right neck and chest was prepped with chlorhexidine, and draped in the usual sterile fashion using maximum barrier technique (cap and mask, sterile gown, sterile gloves, large sterile sheet, hand hygiene and cutaneous antiseptic). Antibiotic prophylaxis was initiated with 2.0g Ancef administered IV within 1 hour prior to skin incision. Local anesthesia was attained by infiltration with 1% lidocaine without epinephrine. Ultrasound demonstrated patency of the right internal jugular vein, and this was documented with an image. Under real-time ultrasound guidance, this vein was accessed with a 21 gauge micropuncture needle and image documentation was performed. A small dermatotomy was made at the access site with an 11 scalpel. A 0.018" wire was advanced into the SVC and the access needle exchanged for a 27F micropuncture vascular sheath. The 0.018" wire was then removed and a 0.035" wire advanced into the IVC. Upon withdrawal of the 018 wire, the wire was marked for appropriate length of the internal portion of the catheter. A 23 cm tip to cuff catheter was selected. The skin and subcutaneous tissues of the right chest wall  were then generously infiltrated with 1% lidocaine without epinephrine from the chest wall to the puncture site at the right IJ. The hemodialysis catheter was then back tunneled from the right chest wall incision to the dermatotomy at the right neck. The venous access site was then serially dilated and a peel away vascular sheath placed over the wire. The wire was removed and the catheter was placed through the peel-away sheath. The catheter tip is positioned in the upper right atrium. This was documented with a spot image. Both ports of the hemodialysis catheter were then tested for excellent function. The ports were then locked with heparinized lock. The incision at the neck was then sealed with Dermabond. Patient tolerated the procedure well and remained hemodynamically stable throughout. No complications were encountered and no significant blood loss was encountered. Marland Kitchen FINDINGS: After catheter placement, the tip lies within the superior cavoatrial junction. The catheter aspirates and flushes normally and is ready for immediate use. IMPRESSION: Status post right IJ tunneled 23 cm HD catheter placement. Catheter ready for use. Signed, Dulcy Fanny. Earleen Newport, DO [facility] Radiology Electronically Signed   By: Corrie Mckusick D.O.   On: 10/22/2017 11:54   Ct Maxillofacial Wo Cm  Result Date: 10/20/2017 CLINICAL DATA:  Dizzy, fell onto concrete. Injury to face. Abrasion to forehead. Pain and swelling to nose and upper lip. Loss of consciousness. EXAM: CT HEAD WITHOUT CONTRAST CT MAXILLOFACIAL WITHOUT CONTRAST CT CERVICAL SPINE WITHOUT CONTRAST TECHNIQUE: Multidetector CT imaging of the head, cervical spine, and maxillofacial structures were performed using the standard protocol without intravenous contrast. Multiplanar CT image reconstructions of the cervical spine and maxillofacial structures were also generated. COMPARISON:  Head CT dated 09/01/2005 FINDINGS: CT HEAD  FINDINGS Brain: Ventricles are normal in size and configuration. All areas of the brain demonstrate normal gray-white matter differentiation. There is no mass, hemorrhage, edema or other evidence of acute parenchymal abnormality. No extra-axial hemorrhage. Vascular: There are chronic calcified atherosclerotic changes of the large vessels at the skull base. No unexpected hyperdense vessel. Skull: Normal. Negative for fracture or focal lesion. Other: None. CT MAXILLOFACIAL FINDINGS  Osseous: Lower frontal bones are intact and normally aligned. No displaced nasal bone fracture. Osseous structures about the orbits are intact and normally aligned bilaterally. Walls of the maxillary sinuses are intact and normally aligned bilaterally. Bilateral zygoma and pterygoid plates are intact. No mandible fracture or displacement seen. Incidental note made of slight anterior subluxation of the left mandibular condyle relative to the condylar fossa, likely related to chronic degenerative change at the TMJ. Orbits: Orbital globes appear intact and symmetric in position. No retro-orbital hemorrhage or edema. Sinuses: Clear Soft tissues: Soft tissue edema overlying the nasal bones and upper lip. No circumscribed soft tissue hematoma appreciated. CT CERVICAL SPINE FINDINGS Alignment: Straightening of the normal cervical spine lordosis, likely related to patient positioning or muscle spasm. No evidence of acute vertebral body subluxation. Skull base and vertebrae: No fracture line or displaced fracture fragment. Facet joints appear intact and normally aligned throughout. Soft tissues and spinal canal: No prevertebral fluid or swelling. No visible canal hematoma. Disc levels: Mild disc desiccations at the C5-6 and C6-7 levels, with associated mild disc space narrowings and mild osseous spurring. No more than mild central canal stenosis at any level. No large disc bulge or protrusion seen. Upper chest: Negative. Other: None IMPRESSION: 1.  Negative head CT. No intracranial mass, hemorrhage or edema. No skull fracture. 2. No facial bone fracture or dislocation. Soft tissue edema overlying the nasal bones and upper lip. 3. No fracture or acute subluxation within the cervical spine. Mild degenerative change within the lower cervical spine. Electronically Signed   By: Franki Cabot M.D.   On: 10/20/2017 16:34     Scheduled Meds: . amLODipine  5 mg Oral QHS  . calcitRIOL  0.25 mcg Oral Q M,W,F-1800  . calcium acetate  1,334 mg Oral TID WC  . darbepoetin (ARANESP) injection - DIALYSIS  200 mcg Intravenous Q Tue-HD  . docusate sodium  100 mg Oral BID  . insulin aspart  0-5 Units Subcutaneous QHS  . insulin aspart  0-9 Units Subcutaneous TID WC  . insulin aspart  5 Units Subcutaneous TID WC  . insulin glargine  18 Units Subcutaneous QHS  . isosorbide mononitrate  15 mg Oral Daily  . lidocaine      . rOPINIRole  0.25 mg Oral QHS  . rosuvastatin  10 mg Oral QHS  . sodium chloride flush  3 mL Intravenous Q12H  . Vitamin D (Ergocalciferol)  50,000 Units Oral Q7 days   Continuous Infusions: . sodium chloride Stopped (10/21/17 1108)  . ferric gluconate (FERRLECIT/NULECIT) IV Stopped (10/22/17 0000)     LOS: 2 days    Time spent: 30 min    Janece Canterbury, MD [facility] Pager [phone]  If 7PM-7AM, please contact night-coverage [email or web address] Password [facility] 10/22/2017, 3:20 PM

## 2017-10-22 NOTE — Procedures (Signed)
Interventional Radiology Procedure Note  Procedure:  Right IJ tunneled HD catheter.  23cm tip to cuff. .  Complications: None Recommendations:  - Ok to use - Do not submerge  - Routine line care   Signed,  Dulcy Fanny. Earleen Newport, DO

## 2017-10-22 NOTE — Sedation Documentation (Signed)
Patient denies pain and is resting comfortably.  

## 2017-10-22 NOTE — Progress Notes (Signed)
Subjective:  S/p HD cath- a little groggy  Objective Vital signs in last 24 hours: Vitals:   10/22/17 1005 10/22/17 1010 10/22/17 1015 10/22/17 1022  BP: (!) 157/93 (!) 143/88 (!) 151/92 (!) 142/95  Pulse: 95 91 94 94  Resp: 17 14 19 18   Temp:      TempSrc:      SpO2: 95% 92% 90% 92%  Weight:      Height:       Weight change:   Intake/Output Summary (Last 24 hours) at 10/22/2017 1057 Last data filed at 10/22/2017 1093 Gross per 24 hour  Intake 1559.58 ml  Output 0 ml  Net 1559.58 ml    Assessment/Plan: 47 year old black male with known advanced chronic kidney disease now with incident of syncope but with GFR discovered to be less than 5 1.Renal- he had not claimed uremic symptoms til now.  He has been trying to be tough.  He now understands that he needs to start dialysis.  s/p PermCath placement with first treatment today, second tomorrow.  Eventually he does want to do peritoneal dialysis.  I told him the recommendation would be for a backup fistula but he is concerned about how that will affect his work and does not consent to that at this time.   2. Hypertension/volume  -volume status seems pretty good.  I do think he had a change in blood pressure and that is what brought on his syncope.  I have chosen to discontinue his labetalol and hydralazine and only continue Norvasc.  As dialysis starts about his blood pressure will be better 3. Anemia  -advanced and worsening.  This is only contributing to his symptoms.  Hemoglobin was in the eights last week and is now in the sixes.  I will give him IV iron and start ESA 4.  Bones-he is on calcitriol and PhosLo, those will be continued    Emilyrose Darrah A    Labs: Basic Metabolic Panel: Recent Labs  Lab 10/20/17 1510 10/21/17 0703 10/21/17 2339  NA 134* 134* 135  K 5.6* 4.0 3.8  CL 97* 98* 96*  CO2 22 20* 21*  GLUCOSE 94 199* 208*  BUN 133* 136* 133*  CREATININE 12.59* 12.69* 12.90*  CALCIUM 8.1* 7.8* 7.9*  PHOS  --   8.0* 7.8*   Liver Function Tests: Recent Labs  Lab 10/20/17 1510 10/21/17 0703 10/21/17 2339  AST 45*  --   --   ALT 37  --   --   ALKPHOS 107  --   --   BILITOT 1.1  --   --   PROT 7.0  --   --   ALBUMIN 3.2* 2.9* 3.2*   No results for input(s): LIPASE, AMYLASE in the last 168 hours. No results for input(s): AMMONIA in the last 168 hours. CBC: Recent Labs  Lab 10/20/17 1510 10/21/17 0703 10/21/17 2339  WBC 10.4 8.9 11.0*  NEUTROABS 8.0*  --   --   HGB 7.9* 6.7* 9.3*  HCT 25.6* 22.1* 29.2*  MCV 75.7* 74.9* 75.6*  PLT 300 267 262   Cardiac Enzymes: Recent Labs  Lab 10/21/17 2348  TROPONINI 0.03*   CBG: Recent Labs  Lab 10/21/17 0736 10/21/17 1135 10/21/17 1701 10/21/17 2136 10/22/17 0741  GLUCAP 191* 287* 198* 197* 174*    Iron Studies:  Recent Labs    10/21/17 0936  IRON 43*  TIBC 318  FERRITIN 188   Studies/Results: Dg Chest 2 View  Result Date: 10/20/2017 CLINICAL DATA:  Recent syncopal episode EXAM: CHEST - 2 VIEW COMPARISON:  09/27/2017 FINDINGS: Cardiac shadow is accentuated by the portable technique but stable. The lungs are well aerated bilaterally. Minimal posterior costophrenic angle atelectasis is noted bilaterally. No sizable effusion is seen. No bony abnormality is noted. IMPRESSION: Minimal basilar atelectasis. Electronically Signed   By: Inez Catalina M.D.   On: 10/20/2017 16:01   Ct Head Wo Contrast  Result Date: 10/20/2017 CLINICAL DATA:  Dizzy, fell onto concrete. Injury to face. Abrasion to forehead. Pain and swelling to nose and upper lip. Loss of consciousness. EXAM: CT HEAD WITHOUT CONTRAST CT MAXILLOFACIAL WITHOUT CONTRAST CT CERVICAL SPINE WITHOUT CONTRAST TECHNIQUE: Multidetector CT imaging of the head, cervical spine, and maxillofacial structures were performed using the standard protocol without intravenous contrast. Multiplanar CT image reconstructions of the cervical spine and maxillofacial structures were also generated.  COMPARISON:  Head CT dated 09/01/2005 FINDINGS: CT HEAD FINDINGS Brain: Ventricles are normal in size and configuration. All areas of the brain demonstrate normal gray-white matter differentiation. There is no mass, hemorrhage, edema or other evidence of acute parenchymal abnormality. No extra-axial hemorrhage. Vascular: There are chronic calcified atherosclerotic changes of the large vessels at the skull base. No unexpected hyperdense vessel. Skull: Normal. Negative for fracture or focal lesion. Other: None. CT MAXILLOFACIAL FINDINGS Osseous: Lower frontal bones are intact and normally aligned. No displaced nasal bone fracture. Osseous structures about the orbits are intact and normally aligned bilaterally. Walls of the maxillary sinuses are intact and normally aligned bilaterally. Bilateral zygoma and pterygoid plates are intact. No mandible fracture or displacement seen. Incidental note made of slight anterior subluxation of the left mandibular condyle relative to the condylar fossa, likely related to chronic degenerative change at the TMJ. Orbits: Orbital globes appear intact and symmetric in position. No retro-orbital hemorrhage or edema. Sinuses: Clear Soft tissues: Soft tissue edema overlying the nasal bones and upper lip. No circumscribed soft tissue hematoma appreciated. CT CERVICAL SPINE FINDINGS Alignment: Straightening of the normal cervical spine lordosis, likely related to patient positioning or muscle spasm. No evidence of acute vertebral body subluxation. Skull base and vertebrae: No fracture line or displaced fracture fragment. Facet joints appear intact and normally aligned throughout. Soft tissues and spinal canal: No prevertebral fluid or swelling. No visible canal hematoma. Disc levels: Mild disc desiccations at the C5-6 and C6-7 levels, with associated mild disc space narrowings and mild osseous spurring. No more than mild central canal stenosis at any level. No large disc bulge or protrusion  seen. Upper chest: Negative. Other: None IMPRESSION: 1. Negative head CT. No intracranial mass, hemorrhage or edema. No skull fracture. 2. No facial bone fracture or dislocation. Soft tissue edema overlying the nasal bones and upper lip. 3. No fracture or acute subluxation within the cervical spine. Mild degenerative change within the lower cervical spine. Electronically Signed   By: Franki Cabot M.D.   On: 10/20/2017 16:34   Ct Cervical Spine Wo Contrast  Result Date: 10/20/2017 CLINICAL DATA:  Dizzy, fell onto concrete. Injury to face. Abrasion to forehead. Pain and swelling to nose and upper lip. Loss of consciousness. EXAM: CT HEAD WITHOUT CONTRAST CT MAXILLOFACIAL WITHOUT CONTRAST CT CERVICAL SPINE WITHOUT CONTRAST TECHNIQUE: Multidetector CT imaging of the head, cervical spine, and maxillofacial structures were performed using the standard protocol without intravenous contrast. Multiplanar CT image reconstructions of the cervical spine and maxillofacial structures were also generated. COMPARISON:  Head CT dated 09/01/2005 FINDINGS: CT HEAD FINDINGS Brain: Ventricles are normal in size  and configuration. All areas of the brain demonstrate normal gray-white matter differentiation. There is no mass, hemorrhage, edema or other evidence of acute parenchymal abnormality. No extra-axial hemorrhage. Vascular: There are chronic calcified atherosclerotic changes of the large vessels at the skull base. No unexpected hyperdense vessel. Skull: Normal. Negative for fracture or focal lesion. Other: None. CT MAXILLOFACIAL FINDINGS Osseous: Lower frontal bones are intact and normally aligned. No displaced nasal bone fracture. Osseous structures about the orbits are intact and normally aligned bilaterally. Walls of the maxillary sinuses are intact and normally aligned bilaterally. Bilateral zygoma and pterygoid plates are intact. No mandible fracture or displacement seen. Incidental note made of slight anterior subluxation  of the left mandibular condyle relative to the condylar fossa, likely related to chronic degenerative change at the TMJ. Orbits: Orbital globes appear intact and symmetric in position. No retro-orbital hemorrhage or edema. Sinuses: Clear Soft tissues: Soft tissue edema overlying the nasal bones and upper lip. No circumscribed soft tissue hematoma appreciated. CT CERVICAL SPINE FINDINGS Alignment: Straightening of the normal cervical spine lordosis, likely related to patient positioning or muscle spasm. No evidence of acute vertebral body subluxation. Skull base and vertebrae: No fracture line or displaced fracture fragment. Facet joints appear intact and normally aligned throughout. Soft tissues and spinal canal: No prevertebral fluid or swelling. No visible canal hematoma. Disc levels: Mild disc desiccations at the C5-6 and C6-7 levels, with associated mild disc space narrowings and mild osseous spurring. No more than mild central canal stenosis at any level. No large disc bulge or protrusion seen. Upper chest: Negative. Other: None IMPRESSION: 1. Negative head CT. No intracranial mass, hemorrhage or edema. No skull fracture. 2. No facial bone fracture or dislocation. Soft tissue edema overlying the nasal bones and upper lip. 3. No fracture or acute subluxation within the cervical spine. Mild degenerative change within the lower cervical spine. Electronically Signed   By: Franki Cabot M.D.   On: 10/20/2017 16:34   Ct Maxillofacial Wo Cm  Result Date: 10/20/2017 CLINICAL DATA:  Dizzy, fell onto concrete. Injury to face. Abrasion to forehead. Pain and swelling to nose and upper lip. Loss of consciousness. EXAM: CT HEAD WITHOUT CONTRAST CT MAXILLOFACIAL WITHOUT CONTRAST CT CERVICAL SPINE WITHOUT CONTRAST TECHNIQUE: Multidetector CT imaging of the head, cervical spine, and maxillofacial structures were performed using the standard protocol without intravenous contrast. Multiplanar CT image reconstructions of the  cervical spine and maxillofacial structures were also generated. COMPARISON:  Head CT dated 09/01/2005 FINDINGS: CT HEAD FINDINGS Brain: Ventricles are normal in size and configuration. All areas of the brain demonstrate normal gray-white matter differentiation. There is no mass, hemorrhage, edema or other evidence of acute parenchymal abnormality. No extra-axial hemorrhage. Vascular: There are chronic calcified atherosclerotic changes of the large vessels at the skull base. No unexpected hyperdense vessel. Skull: Normal. Negative for fracture or focal lesion. Other: None. CT MAXILLOFACIAL FINDINGS Osseous: Lower frontal bones are intact and normally aligned. No displaced nasal bone fracture. Osseous structures about the orbits are intact and normally aligned bilaterally. Walls of the maxillary sinuses are intact and normally aligned bilaterally. Bilateral zygoma and pterygoid plates are intact. No mandible fracture or displacement seen. Incidental note made of slight anterior subluxation of the left mandibular condyle relative to the condylar fossa, likely related to chronic degenerative change at the TMJ. Orbits: Orbital globes appear intact and symmetric in position. No retro-orbital hemorrhage or edema. Sinuses: Clear Soft tissues: Soft tissue edema overlying the nasal bones and upper lip. No  circumscribed soft tissue hematoma appreciated. CT CERVICAL SPINE FINDINGS Alignment: Straightening of the normal cervical spine lordosis, likely related to patient positioning or muscle spasm. No evidence of acute vertebral body subluxation. Skull base and vertebrae: No fracture line or displaced fracture fragment. Facet joints appear intact and normally aligned throughout. Soft tissues and spinal canal: No prevertebral fluid or swelling. No visible canal hematoma. Disc levels: Mild disc desiccations at the C5-6 and C6-7 levels, with associated mild disc space narrowings and mild osseous spurring. No more than mild central  canal stenosis at any level. No large disc bulge or protrusion seen. Upper chest: Negative. Other: None IMPRESSION: 1. Negative head CT. No intracranial mass, hemorrhage or edema. No skull fracture. 2. No facial bone fracture or dislocation. Soft tissue edema overlying the nasal bones and upper lip. 3. No fracture or acute subluxation within the cervical spine. Mild degenerative change within the lower cervical spine. Electronically Signed   By: Franki Cabot M.D.   On: 10/20/2017 16:34   Medications: Infusions: . sodium chloride Stopped (10/21/17 1108)  . ferric gluconate (FERRLECIT/NULECIT) IV Stopped (10/22/17 0000)    Scheduled Medications: . amLODipine  5 mg Oral QHS  . calcitRIOL  0.25 mcg Oral Q M,W,F-1800  . calcium acetate  1,334 mg Oral TID WC  . darbepoetin (ARANESP) injection - DIALYSIS  200 mcg Intravenous Q Tue-HD  . docusate sodium  100 mg Oral BID  . insulin aspart  0-5 Units Subcutaneous QHS  . insulin aspart  0-9 Units Subcutaneous TID WC  . insulin aspart  5 Units Subcutaneous TID WC  . insulin glargine  18 Units Subcutaneous QHS  . isosorbide mononitrate  15 mg Oral Daily  . lidocaine      . rOPINIRole  0.25 mg Oral QHS  . rosuvastatin  10 mg Oral QHS  . sodium chloride flush  3 mL Intravenous Q12H  . Vitamin D (Ergocalciferol)  50,000 Units Oral Q7 days    have reviewed scheduled and prn medications.  Physical Exam: General: groggy  Heart: RRR Lungs: mostly clear Abdomen: soft, non tender Extremities: minimal edema Dialysis Access: new right sided pc placed 3/12    10/22/2017,10:57 AM  LOS: 2 days

## 2017-10-22 NOTE — Consult Note (Signed)
Chief Complaint: Patient was seen in consultation today for tunneled dialysis catheter placement Chief Complaint  Patient presents with  . Loss of Consciousness   at the request of Dr Shirl Harris   Supervising Physician: Corrie Mckusick  Patient Status: [facility] - In-pt  History of Present Illness: Hector Mahoney is a 47 y.o. male   Hx DM; HTN; Advanced chronic renal disease Worsening creatinine- Renal failure Need for dialysis to begin per Dr Moshe Cipro  Scheduled for tunneled dialysis catheter placement in IR  Past Medical History:  Diagnosis Date  . ABSCESS, TOOTH 04/23/2009  . AKI (acute kidney injury) ([place]) 08/2015  . CHF (congestive heart failure) ([place])   . DIABETES MELLITUS, TYPE I 07/30/2007  . Esophageal reflux 04/27/2009  . FATIGUE 04/27/2009  . HYPERLIPIDEMIA 04/24/2007  . HYPERTENSION 04/24/2007  . INSOMNIA-SLEEP DISORDER-UNSPEC 04/27/2009  . LUMBAR STRAIN, ACUTE 12/11/2008  . RASH-NONVESICULAR 03/11/2008  . Shortness of breath    "lying down and w/exertion right now" (12/22/2012)    Past Surgical History:  Procedure Laterality Date  . MOUTH SURGERY      Allergies: Patient has no known allergies.  Medications: Prior to Admission medications   Medication Sig Start Date End Date Taking? Authorizing Provider  amLODipine (NORVASC) 5 MG tablet Take 1 tablet (5 mg total) by mouth daily. 09/05/17  Yes Marrian Salvage, FNP  aspirin EC 81 MG EC tablet Take 1 tablet (81 mg total) by mouth daily. 09/06/15  Yes Nita Sells, MD  calcitRIOL (ROCALTROL) 0.25 MCG capsule Take 1 capsule (0.25 mcg total) by mouth every Monday, Wednesday, and Friday at 6 PM. 09/30/17  Yes Thurnell Lose, MD  cyclobenzaprine (FLEXERIL) 10 MG tablet Take 1 tablet (10 mg total) by mouth at bedtime as needed for muscle spasms. 10/07/17  Yes Biagio Borg, MD  ferrous sulfate 325 (65 FE) MG tablet Take 1 tablet (325 mg total) by mouth 2 (two) times daily with a meal. 09/28/17  Yes  Thurnell Lose, MD  furosemide (LASIX) 80 MG tablet Take 1 tablet (80 mg total) by mouth daily. 09/29/17  Yes Thurnell Lose, MD  hydrALAZINE (APRESOLINE) 25 MG tablet Take 1 tablet (25 mg total) by mouth 3 (three) times daily. 09/28/17  Yes Thurnell Lose, MD  insulin aspart (NOVOLOG) 100 UNIT/ML injection Inject 8-12 Units into the skin 3 (three) times daily with meals. Patient taking differently: Inject 8-12 Units into the skin 3 (three) times daily with meals. Blood sugar 120 = 2 units 130 - 10 units Higher than 130 =  12 units 04/25/16  Yes Philemon Kingdom, MD  insulin glargine (LANTUS) 100 UNIT/ML injection Inject 0.4 mLs (40 Units total) into the skin at bedtime. Patient taking differently: Inject 32 Units into the skin at bedtime.  04/25/16  Yes Philemon Kingdom, MD  isosorbide mononitrate (IMDUR) 30 MG 24 hr tablet TAKE ONE-HALF TABLET BY MOUTH ONCE DAILY Patient taking differently: TAKE 15 mg  TABLET BY MOUTH ONCE DAILY 10/12/16  Yes Biagio Borg, MD  labetalol (NORMODYNE) 200 MG tablet Take 1 tablet (200 mg total) by mouth 2 (two) times daily. 09/28/17  Yes Thurnell Lose, MD  metolazone (ZAROXOLYN) 5 MG tablet Take 1 tablet (5 mg total) by mouth daily. 10/07/17  Yes Biagio Borg, MD  rosuvastatin (CRESTOR) 20 MG tablet TAKE 1 TABLET BY MOUTH ONCE DAILY Patient taking differently: TAKE 120 mg TABLET BY MOUTH ONCE in the evening 08/21/17  Yes Skeet Latch, MD  traMADol (ULTRAM) 50 MG tablet Take 1 tablet (50 mg total) by mouth every 8 (eight) hours as needed. Patient taking differently: Take 50 mg by mouth every 8 (eight) hours as needed for moderate pain.  03/29/17  Yes Biagio Borg, MD  rOPINIRole (REQUIP) 0.25 MG tablet TAKE 1 TABLET BY MOUTH AT BEDTIME 10/21/17   Biagio Borg, MD     Family History  Problem Relation Age of Onset  . Hypertension Mother   . Diabetes Mother   . Diabetes Maternal Aunt   . Kidney disease Neg Hx     Social History   Socioeconomic  History  . Marital status: Married    Spouse name: None  . Number of children: None  . Years of education: None  . Highest education level: None  Social Needs  . Financial resource strain: None  . Food insecurity - worry: None  . Food insecurity - inability: None  . Transportation needs - medical: None  . Transportation needs - non-medical: None  Occupational History  . None  Tobacco Use  . Smoking status: Former Smoker    Types: Cigarettes    Last attempt to quit: 05/03/2015    Years since quitting: 2.4  . Smokeless tobacco: Never Used  . Tobacco comment: 12/22/2012 "used to smoke cigarettes once or twice/month; haven't had any cigarettes for years"  Substance and Sexual Activity  . Alcohol use: Yes    Comment: occ  . Drug use: No  . Sexual activity: Yes  Other Topics Concern  . None  Social History Narrative  . None    Review of Systems: A 12 point ROS discussed and pertinent positives are indicated in the HPI above.  All other systems are negative.  Review of Systems  Constitutional: Positive for activity change and fatigue. Negative for fever and unexpected weight change.  Respiratory: Negative for cough and shortness of breath.   Cardiovascular: Negative for chest pain.  Neurological: Negative for weakness.  Psychiatric/Behavioral: Negative for behavioral problems and confusion.    Vital Signs: BP 129/77 (BP Location: Right Arm)   Pulse 94   Temp 98.7 F (37.1 C) (Oral)   Resp 20   Ht 5\' 6"  (1.676 m)   Wt 208 lb 1.8 oz (94.4 kg)   SpO2 97%   BMI 33.59 kg/m   Physical Exam  Constitutional: He is oriented to person, place, and time.  Cardiovascular: Normal rate, regular rhythm and normal heart sounds.  Pulmonary/Chest: Effort normal and breath sounds normal.  Abdominal: Soft. Bowel sounds are normal.  Musculoskeletal: Normal range of motion.  Neurological: He is alert and oriented to person, place, and time.  Skin: Skin is warm and dry.  Psychiatric: He  has a normal mood and affect. His behavior is normal. Judgment and thought content normal.  Nursing note and vitals reviewed.   Imaging: Dg Chest 2 View  Result Date: 10/20/2017 CLINICAL DATA:  Recent syncopal episode EXAM: CHEST - 2 VIEW COMPARISON:  09/27/2017 FINDINGS: Cardiac shadow is accentuated by the portable technique but stable. The lungs are well aerated bilaterally. Minimal posterior costophrenic angle atelectasis is noted bilaterally. No sizable effusion is seen. No bony abnormality is noted. IMPRESSION: Minimal basilar atelectasis. Electronically Signed   By: Inez Catalina M.D.   On: 10/20/2017 16:01   Dg Chest 2 View  Result Date: 09/27/2017 CLINICAL DATA:  Short of breath EXAM: CHEST  2 VIEW COMPARISON:  09/02/2015 FINDINGS: The heart size and mediastinal contours are within normal  limits. Both lungs are clear. The visualized skeletal structures are unremarkable. IMPRESSION: No active cardiopulmonary disease. Electronically Signed   By: Franchot Gallo M.D.   On: 09/27/2017 07:07   Ct Head Wo Contrast  Result Date: 10/20/2017 CLINICAL DATA:  Dizzy, fell onto concrete. Injury to face. Abrasion to forehead. Pain and swelling to nose and upper lip. Loss of consciousness. EXAM: CT HEAD WITHOUT CONTRAST CT MAXILLOFACIAL WITHOUT CONTRAST CT CERVICAL SPINE WITHOUT CONTRAST TECHNIQUE: Multidetector CT imaging of the head, cervical spine, and maxillofacial structures were performed using the standard protocol without intravenous contrast. Multiplanar CT image reconstructions of the cervical spine and maxillofacial structures were also generated. COMPARISON:  Head CT dated 09/01/2005 FINDINGS: CT HEAD FINDINGS Brain: Ventricles are normal in size and configuration. All areas of the brain demonstrate normal gray-white matter differentiation. There is no mass, hemorrhage, edema or other evidence of acute parenchymal abnormality. No extra-axial hemorrhage. Vascular: There are chronic calcified  atherosclerotic changes of the large vessels at the skull base. No unexpected hyperdense vessel. Skull: Normal. Negative for fracture or focal lesion. Other: None. CT MAXILLOFACIAL FINDINGS Osseous: Lower frontal bones are intact and normally aligned. No displaced nasal bone fracture. Osseous structures about the orbits are intact and normally aligned bilaterally. Walls of the maxillary sinuses are intact and normally aligned bilaterally. Bilateral zygoma and pterygoid plates are intact. No mandible fracture or displacement seen. Incidental note made of slight anterior subluxation of the left mandibular condyle relative to the condylar fossa, likely related to chronic degenerative change at the TMJ. Orbits: Orbital globes appear intact and symmetric in position. No retro-orbital hemorrhage or edema. Sinuses: Clear Soft tissues: Soft tissue edema overlying the nasal bones and upper lip. No circumscribed soft tissue hematoma appreciated. CT CERVICAL SPINE FINDINGS Alignment: Straightening of the normal cervical spine lordosis, likely related to patient positioning or muscle spasm. No evidence of acute vertebral body subluxation. Skull base and vertebrae: No fracture line or displaced fracture fragment. Facet joints appear intact and normally aligned throughout. Soft tissues and spinal canal: No prevertebral fluid or swelling. No visible canal hematoma. Disc levels: Mild disc desiccations at the C5-6 and C6-7 levels, with associated mild disc space narrowings and mild osseous spurring. No more than mild central canal stenosis at any level. No large disc bulge or protrusion seen. Upper chest: Negative. Other: None IMPRESSION: 1. Negative head CT. No intracranial mass, hemorrhage or edema. No skull fracture. 2. No facial bone fracture or dislocation. Soft tissue edema overlying the nasal bones and upper lip. 3. No fracture or acute subluxation within the cervical spine. Mild degenerative change within the lower cervical  spine. Electronically Signed   By: Franki Cabot M.D.   On: 10/20/2017 16:34   Ct Cervical Spine Wo Contrast  Result Date: 10/20/2017 CLINICAL DATA:  Dizzy, fell onto concrete. Injury to face. Abrasion to forehead. Pain and swelling to nose and upper lip. Loss of consciousness. EXAM: CT HEAD WITHOUT CONTRAST CT MAXILLOFACIAL WITHOUT CONTRAST CT CERVICAL SPINE WITHOUT CONTRAST TECHNIQUE: Multidetector CT imaging of the head, cervical spine, and maxillofacial structures were performed using the standard protocol without intravenous contrast. Multiplanar CT image reconstructions of the cervical spine and maxillofacial structures were also generated. COMPARISON:  Head CT dated 09/01/2005 FINDINGS: CT HEAD FINDINGS Brain: Ventricles are normal in size and configuration. All areas of the brain demonstrate normal gray-white matter differentiation. There is no mass, hemorrhage, edema or other evidence of acute parenchymal abnormality. No extra-axial hemorrhage. Vascular: There are chronic calcified atherosclerotic  changes of the large vessels at the skull base. No unexpected hyperdense vessel. Skull: Normal. Negative for fracture or focal lesion. Other: None. CT MAXILLOFACIAL FINDINGS Osseous: Lower frontal bones are intact and normally aligned. No displaced nasal bone fracture. Osseous structures about the orbits are intact and normally aligned bilaterally. Walls of the maxillary sinuses are intact and normally aligned bilaterally. Bilateral zygoma and pterygoid plates are intact. No mandible fracture or displacement seen. Incidental note made of slight anterior subluxation of the left mandibular condyle relative to the condylar fossa, likely related to chronic degenerative change at the TMJ. Orbits: Orbital globes appear intact and symmetric in position. No retro-orbital hemorrhage or edema. Sinuses: Clear Soft tissues: Soft tissue edema overlying the nasal bones and upper lip. No circumscribed soft tissue hematoma  appreciated. CT CERVICAL SPINE FINDINGS Alignment: Straightening of the normal cervical spine lordosis, likely related to patient positioning or muscle spasm. No evidence of acute vertebral body subluxation. Skull base and vertebrae: No fracture line or displaced fracture fragment. Facet joints appear intact and normally aligned throughout. Soft tissues and spinal canal: No prevertebral fluid or swelling. No visible canal hematoma. Disc levels: Mild disc desiccations at the C5-6 and C6-7 levels, with associated mild disc space narrowings and mild osseous spurring. No more than mild central canal stenosis at any level. No large disc bulge or protrusion seen. Upper chest: Negative. Other: None IMPRESSION: 1. Negative head CT. No intracranial mass, hemorrhage or edema. No skull fracture. 2. No facial bone fracture or dislocation. Soft tissue edema overlying the nasal bones and upper lip. 3. No fracture or acute subluxation within the cervical spine. Mild degenerative change within the lower cervical spine. Electronically Signed   By: Franki Cabot M.D.   On: 10/20/2017 16:34   Ct Maxillofacial Wo Cm  Result Date: 10/20/2017 CLINICAL DATA:  Dizzy, fell onto concrete. Injury to face. Abrasion to forehead. Pain and swelling to nose and upper lip. Loss of consciousness. EXAM: CT HEAD WITHOUT CONTRAST CT MAXILLOFACIAL WITHOUT CONTRAST CT CERVICAL SPINE WITHOUT CONTRAST TECHNIQUE: Multidetector CT imaging of the head, cervical spine, and maxillofacial structures were performed using the standard protocol without intravenous contrast. Multiplanar CT image reconstructions of the cervical spine and maxillofacial structures were also generated. COMPARISON:  Head CT dated 09/01/2005 FINDINGS: CT HEAD FINDINGS Brain: Ventricles are normal in size and configuration. All areas of the brain demonstrate normal gray-white matter differentiation. There is no mass, hemorrhage, edema or other evidence of acute parenchymal abnormality.  No extra-axial hemorrhage. Vascular: There are chronic calcified atherosclerotic changes of the large vessels at the skull base. No unexpected hyperdense vessel. Skull: Normal. Negative for fracture or focal lesion. Other: None. CT MAXILLOFACIAL FINDINGS Osseous: Lower frontal bones are intact and normally aligned. No displaced nasal bone fracture. Osseous structures about the orbits are intact and normally aligned bilaterally. Walls of the maxillary sinuses are intact and normally aligned bilaterally. Bilateral zygoma and pterygoid plates are intact. No mandible fracture or displacement seen. Incidental note made of slight anterior subluxation of the left mandibular condyle relative to the condylar fossa, likely related to chronic degenerative change at the TMJ. Orbits: Orbital globes appear intact and symmetric in position. No retro-orbital hemorrhage or edema. Sinuses: Clear Soft tissues: Soft tissue edema overlying the nasal bones and upper lip. No circumscribed soft tissue hematoma appreciated. CT CERVICAL SPINE FINDINGS Alignment: Straightening of the normal cervical spine lordosis, likely related to patient positioning or muscle spasm. No evidence of acute vertebral body subluxation. Skull base  and vertebrae: No fracture line or displaced fracture fragment. Facet joints appear intact and normally aligned throughout. Soft tissues and spinal canal: No prevertebral fluid or swelling. No visible canal hematoma. Disc levels: Mild disc desiccations at the C5-6 and C6-7 levels, with associated mild disc space narrowings and mild osseous spurring. No more than mild central canal stenosis at any level. No large disc bulge or protrusion seen. Upper chest: Negative. Other: None IMPRESSION: 1. Negative head CT. No intracranial mass, hemorrhage or edema. No skull fracture. 2. No facial bone fracture or dislocation. Soft tissue edema overlying the nasal bones and upper lip. 3. No fracture or acute subluxation within the  cervical spine. Mild degenerative change within the lower cervical spine. Electronically Signed   By: Franki Cabot M.D.   On: 10/20/2017 16:34    Labs:  CBC: Recent Labs    09/28/17 0543 10/20/17 1510 10/21/17 0703 10/21/17 2339  WBC 6.2 10.4 8.9 11.0*  HGB 8.6* 7.9* 6.7* 9.3*  HCT 27.3* 25.6* 22.1* 29.2*  PLT 278 300 267 262    COAGS: Recent Labs    10/21/17 0703 10/21/17 2339  INR 1.16 1.05    BMP: Recent Labs    09/28/17 0543 10/20/17 1510 10/21/17 0703 10/21/17 2339  NA 139 134* 134* 135  K 3.8 5.6* 4.0 3.8  CL 106 97* 98* 96*  CO2 21* 22 20* 21*  GLUCOSE 211* 94 199* 208*  BUN 94* 133* 136* 133*  CALCIUM 7.2* 8.1* 7.8* 7.9*  CREATININE 9.23* 12.59* 12.69* 12.90*  GFRNONAA 6* 4* 4* 4*  GFRAA 7* 5* 5* 5*    LIVER FUNCTION TESTS: Recent Labs    09/27/17 0653 10/20/17 1510 10/21/17 0703 10/21/17 2339  BILITOT 0.9 1.1  --   --   AST 16 45*  --   --   ALT 21 37  --   --   ALKPHOS 97 107  --   --   PROT 7.3 7.0  --   --   ALBUMIN 3.3* 3.2* 2.9* 3.2*    TUMOR MARKERS: No results for input(s): AFPTM, CEA, CA199, CHROMGRNA in the last 8760 hours.  Assessment and Plan:  Progressive CKD Renal failure Need for initiation of HD Scheduled for tunneled dialysis catheter placement Risks and benefits discussed with the patient including, but not limited to bleeding, infection, vascular injury, pneumothorax which may require chest tube placement, air embolism or even death  All of the patient's questions were answered, patient is agreeable to proceed. Consent signed and in chart.  Thank you for this interesting consult.  I greatly enjoyed meeting Josephus L Hyneman and look forward to participating in their care.  A copy of this report was sent to the requesting provider on this date.  Electronically Signed: Lavonia Drafts, PA-C 10/22/2017, 6:48 AM   I spent a total of 20 Minutes    in face to face in clinical consultation, greater than 50% of which  was counseling/coordinating care for tunneled HD catheter

## 2017-10-22 NOTE — Procedures (Signed)
Patient was seen on dialysis and the procedure was supervised.  BFR 200  Via PC BP is  142/95.   Patient appears to be tolerating treatment well- first treatment ever  Flem Enderle A 10/22/2017

## 2017-10-22 NOTE — Care Management Note (Signed)
Case Management Note  Patient Details  Name: Hector Mahoney MRN: [id number] Date of Birth: [birth year]  Subjective/Objective:    History of CHF, chronic kidney disease stage V;Admitted for Acute Renal Failure, Syncope.              Action/Plan: Status post Right IJ tunneled HD catheter 10/22/17.  Clipping pending. Prior to admission patient lived at home.  Will be returning to the same living situation after discharge.  NCM will continue to follow for discharge needs.   Expected Discharge Date:                  Expected Discharge Plan:  Home/Self Care  In-House Referral:  Clinical Social Work  Discharge planning Services  CM Consult  Status of Service:  In process, will continue to follow  Kristen Cardinal, RN  Nurse case [place] 10/22/2017, 11:04 AM

## 2017-10-22 NOTE — Progress Notes (Signed)
Patient's transporter reported this nurse that he vomited x 2 while bringing him from IR to our unit.  Assessed the patient and asked if he needs anything for nausea but he refused.  Will continue to monitor.

## 2017-10-23 ENCOUNTER — Inpatient Hospital Stay (HOSPITAL_COMMUNITY): Payer: Federal, State, Local not specified - PPO

## 2017-10-23 DIAGNOSIS — N185 Chronic kidney disease, stage 5: Secondary | ICD-10-CM | POA: Diagnosis not present

## 2017-10-23 DIAGNOSIS — D631 Anemia in chronic kidney disease: Secondary | ICD-10-CM | POA: Diagnosis not present

## 2017-10-23 DIAGNOSIS — I10 Essential (primary) hypertension: Secondary | ICD-10-CM | POA: Diagnosis not present

## 2017-10-23 DIAGNOSIS — E1022 Type 1 diabetes mellitus with diabetic chronic kidney disease: Secondary | ICD-10-CM | POA: Diagnosis not present

## 2017-10-23 DIAGNOSIS — R1110 Vomiting, unspecified: Secondary | ICD-10-CM | POA: Diagnosis not present

## 2017-10-23 DIAGNOSIS — N179 Acute kidney failure, unspecified: Secondary | ICD-10-CM | POA: Diagnosis not present

## 2017-10-23 DIAGNOSIS — N186 End stage renal disease: Secondary | ICD-10-CM | POA: Diagnosis not present

## 2017-10-23 DIAGNOSIS — I120 Hypertensive chronic kidney disease with stage 5 chronic kidney disease or end stage renal disease: Secondary | ICD-10-CM | POA: Diagnosis not present

## 2017-10-23 LAB — GLUCOSE, CAPILLARY
GLUCOSE-CAPILLARY: 137 mg/dL — AB (ref 65–99)
Glucose-Capillary: 195 mg/dL — ABNORMAL HIGH (ref 65–99)
Glucose-Capillary: 157 mg/dL — ABNORMAL HIGH (ref 65–99)

## 2017-10-23 LAB — CBC
HEMATOCRIT: 28.4 % — AB (ref 39.0–52.0)
Hemoglobin: 8.9 g/dL — ABNORMAL LOW (ref 13.0–17.0)
MCH: 24 pg — ABNORMAL LOW (ref 26.0–34.0)
MCHC: 31.3 g/dL (ref 30.0–36.0)
MCV: 76.5 fL — AB (ref 78.0–100.0)
Platelets: 257 10*3/uL (ref 150–400)
RBC: 3.71 MIL/uL — ABNORMAL LOW (ref 4.22–5.81)
RDW: 15.9 % — AB (ref 11.5–15.5)
WBC: 14 10*3/uL — ABNORMAL HIGH (ref 4.0–10.5)

## 2017-10-23 LAB — RENAL FUNCTION PANEL
ALBUMIN: 2.9 g/dL — AB (ref 3.5–5.0)
Anion gap: 15 (ref 5–15)
BUN: 77 mg/dL — AB (ref 6–20)
CHLORIDE: 98 mmol/L — AB (ref 101–111)
CO2: 25 mmol/L (ref 22–32)
Calcium: 8.4 mg/dL — ABNORMAL LOW (ref 8.9–10.3)
Creatinine, Ser: 9.31 mg/dL — ABNORMAL HIGH (ref 0.61–1.24)
GFR calc Af Amer: 7 mL/min — ABNORMAL LOW (ref >60)
GFR calc non Af Amer: 6 mL/min — ABNORMAL LOW (ref >60)
Glucose, Bld: 121 mg/dL — ABNORMAL HIGH (ref 65–99)
POTASSIUM: 3.7 mmol/L (ref 3.5–5.1)
Phosphorus: 6.7 mg/dL — ABNORMAL HIGH (ref 2.5–4.6)
Sodium: 138 mmol/L (ref 135–145)

## 2017-10-23 LAB — HEPATITIS B SURFACE ANTIBODY,QUALITATIVE: Hep B S Ab: NONREACTIVE

## 2017-10-23 LAB — HEPATITIS B CORE ANTIBODY, TOTAL: Hep B Core Total Ab: NEGATIVE

## 2017-10-23 MED ORDER — HEPARIN SODIUM (PORCINE) 1000 UNIT/ML DIALYSIS: 20.0000 [IU]/kg | INTRAMUSCULAR | Status: DC | PRN

## 2017-10-23 MED ORDER — LACTULOSE 10 GM/15ML PO SOLN
10.0000 g | Freq: Once | ORAL | Status: AC
  Administered 2017-10-23: 10 g via ORAL
  Filled 2017-10-23: qty 15

## 2017-10-23 MED ORDER — POLYETHYLENE GLYCOL 3350 17 G PO PACK
17.0000 g | PACK | Freq: Every day | ORAL | Status: DC | PRN
  Administered 2017-10-24: 17 g via ORAL
  Filled 2017-10-23: qty 1

## 2017-10-23 NOTE — Procedures (Signed)
Patient was seen on dialysis and the procedure was supervised.  BFR 250  Via PC BP is  154/70.   Patient appears to be tolerating treatment well  Hector Mahoney A 10/23/2017

## 2017-10-23 NOTE — Progress Notes (Signed)
Subjective:  HD yest- ran even- seen in HD for treatment #2- having nausea and vomiting since yesterday  Objective Vital signs in last 24 hours: Vitals:   10/22/17 1830 10/22/17 1845 10/22/17 2058 10/23/17 0450  BP: (!) 172/99 (!) 177/98 (!) 176/89 (!) 155/89  Pulse: 86 93 100 90  Resp:  18 19 19   Temp:  97.9 F (36.6 C) 98.4 F (36.9 C) 98.1 F (36.7 C)  TempSrc:  Oral Oral Oral  SpO2:  98% 91% 94%  Weight:  92.9 kg (204 lb 12.9 oz) 92.9 kg (204 lb 12.9 oz)   Height:       Weight change: -1.1 kg (-6.8 oz)  Intake/Output Summary (Last 24 hours) at 10/23/2017 1054 Last data filed at 10/23/2017 0200 Gross per 24 hour  Intake 553 ml  Output 0 ml  Net 553 ml    Assessment/Plan: 47 year old black male with known advanced chronic kidney disease now with incident of syncope but with GFR discovered to be less than 5 1.Renal- he had not claimed uremic symptoms til now.  He has been trying to be tough.  He now understands that he needs to start dialysis.  s/p PermCath placement with first treatment 3/12, second 3/13 and third tomorrow.  CLIP to OP unit.  Eventually he does want to do peritoneal dialysis.  I told him the recommendation would be for a backup fistula but he is concerned about how that will affect his work and does not consent to that at this time.  I think N/V due to meds for PC and uremia- should get better- PRN antiemetics right now.  I have contacted home training to make contact to expedite home visit and PD cath placement 2. Hypertension/volume  -volume status seems pretty good.  I do think he had a change in blood pressure and that is what brought on his syncope.  I have chosen to discontinue his labetalol and hydralazine and only continue Norvasc.  As dialysis starts I think his blood pressure will improve 3. Anemia  -advanced and worsening.  This is only contributing to his symptoms.  Hemoglobin was in the eights last week and is now in the sixes- s/p transfusion 3/11.   Getting IV iron and ESA 4.  Bones-he is on calcitriol and PhosLo, those will be continued    Idonna Heeren A    Labs: Basic Metabolic Panel: Recent Labs  Lab 10/20/17 1510 10/21/17 0703 10/21/17 2339  NA 134* 134* 135  K 5.6* 4.0 3.8  CL 97* 98* 96*  CO2 22 20* 21*  GLUCOSE 94 199* 208*  BUN 133* 136* 133*  CREATININE 12.59* 12.69* 12.90*  CALCIUM 8.1* 7.8* 7.9*  PHOS  --  8.0* 7.8*   Liver Function Tests: Recent Labs  Lab 10/20/17 1510 10/21/17 0703 10/21/17 2339  AST 45*  --   --   ALT 37  --   --   ALKPHOS 107  --   --   BILITOT 1.1  --   --   PROT 7.0  --   --   ALBUMIN 3.2* 2.9* 3.2*   No results for input(s): LIPASE, AMYLASE in the last 168 hours. No results for input(s): AMMONIA in the last 168 hours. CBC: Recent Labs  Lab 10/20/17 1510 10/21/17 0703 10/21/17 2339  WBC 10.4 8.9 11.0*  NEUTROABS 8.0*  --   --   HGB 7.9* 6.7* 9.3*  HCT 25.6* 22.1* 29.2*  MCV 75.7* 74.9* 75.6*  PLT 300 267 262  Cardiac Enzymes: Recent Labs  Lab 10/21/17 2348  TROPONINI 0.03*   CBG: Recent Labs  Lab 10/21/17 2136 10/22/17 0741 10/22/17 1148 10/22/17 2056 10/23/17 0744  GLUCAP 197* 174* 220* 117* 137*    Iron Studies:  Recent Labs    10/21/17 0936  IRON 43*  TIBC 318  FERRITIN 188   Studies/Results: Ir Fluoro Guide Cv Line Right  Result Date: 10/22/2017 INDICATION: 47 year old male with a history end-stage renal disease EXAM: IMAGE GUIDED TUNNELED HEMODIALYSIS CATHETER MEDICATIONS: 2.0 g Ancef. The antibiotic was given in an appropriate time interval prior to skin puncture. ANESTHESIA/SEDATION: Versed 1.0 mg IV; Fentanyl 50 mcg IV; Moderate Sedation Time:  15 minutes The patient was continuously monitored during the procedure by the interventional radiology nurse under my direct supervision. FLUOROSCOPY TIME:  Fluoroscopy Time: 0 minutes 24 seconds (2.6 mGy). COMPLICATIONS: None PROCEDURE: The procedure, risks, benefits, and alternatives were  explained to the patient. Questions regarding the procedure were encouraged and answered. The patient understands and consents to the procedure. The right neck and chest was prepped with chlorhexidine, and draped in the usual sterile fashion using maximum barrier technique (cap and mask, sterile gown, sterile gloves, large sterile sheet, hand hygiene and cutaneous antiseptic). Antibiotic prophylaxis was initiated with 2.0g Ancef administered IV within 1 hour prior to skin incision. Local anesthesia was attained by infiltration with 1% lidocaine without epinephrine. Ultrasound demonstrated patency of the right internal jugular vein, and this was documented with an image. Under real-time ultrasound guidance, this vein was accessed with a 21 gauge micropuncture needle and image documentation was performed. A small dermatotomy was made at the access site with an 11 scalpel. A 0.018" wire was advanced into the SVC and the access needle exchanged for a 41F micropuncture vascular sheath. The 0.018" wire was then removed and a 0.035" wire advanced into the IVC. Upon withdrawal of the 018 wire, the wire was marked for appropriate length of the internal portion of the catheter. A 23 cm tip to cuff catheter was selected. The skin and subcutaneous tissues of the right chest wall were then generously infiltrated with 1% lidocaine without epinephrine from the chest wall to the puncture site at the right IJ. The hemodialysis catheter was then back tunneled from the right chest wall incision to the dermatotomy at the right neck. The venous access site was then serially dilated and a peel away vascular sheath placed over the wire. The wire was removed and the catheter was placed through the peel-away sheath. The catheter tip is positioned in the upper right atrium. This was documented with a spot image. Both ports of the hemodialysis catheter were then tested for excellent function. The ports were then locked with heparinized lock.  The incision at the neck was then sealed with Dermabond. Patient tolerated the procedure well and remained hemodynamically stable throughout. No complications were encountered and no significant blood loss was encountered. Marland Kitchen FINDINGS: After catheter placement, the tip lies within the superior cavoatrial junction. The catheter aspirates and flushes normally and is ready for immediate use. IMPRESSION: Status post right IJ tunneled 23 cm HD catheter placement. Catheter ready for use. Signed, Dulcy Fanny. Earleen Newport, DO [facility] Radiology Electronically Signed   By: Corrie Mckusick D.O.   On: 10/22/2017 11:54   Ir US Guide Vasc Access Right  Result Date: 10/22/2017 INDICATION: 47 year old male with a history end-stage renal disease EXAM: IMAGE GUIDED TUNNELED HEMODIALYSIS CATHETER MEDICATIONS: 2.0 g Ancef. The antibiotic was given  in an appropriate time interval prior to skin puncture. ANESTHESIA/SEDATION: Versed 1.0 mg IV; Fentanyl 50 mcg IV; Moderate Sedation Time:  15 minutes The patient was continuously monitored during the procedure by the interventional radiology nurse under my direct supervision. FLUOROSCOPY TIME:  Fluoroscopy Time: 0 minutes 24 seconds (2.6 mGy). COMPLICATIONS: None PROCEDURE: The procedure, risks, benefits, and alternatives were explained to the patient. Questions regarding the procedure were encouraged and answered. The patient understands and consents to the procedure. The right neck and chest was prepped with chlorhexidine, and draped in the usual sterile fashion using maximum barrier technique (cap and mask, sterile gown, sterile gloves, large sterile sheet, hand hygiene and cutaneous antiseptic). Antibiotic prophylaxis was initiated with 2.0g Ancef administered IV within 1 hour prior to skin incision. Local anesthesia was attained by infiltration with 1% lidocaine without epinephrine. Ultrasound demonstrated patency of the right internal  jugular vein, and this was documented with an image. Under real-time ultrasound guidance, this vein was accessed with a 21 gauge micropuncture needle and image documentation was performed. A small dermatotomy was made at the access site with an 11 scalpel. A 0.018" wire was advanced into the SVC and the access needle exchanged for a 55F micropuncture vascular sheath. The 0.018" wire was then removed and a 0.035" wire advanced into the IVC. Upon withdrawal of the 018 wire, the wire was marked for appropriate length of the internal portion of the catheter. A 23 cm tip to cuff catheter was selected. The skin and subcutaneous tissues of the right chest wall were then generously infiltrated with 1% lidocaine without epinephrine from the chest wall to the puncture site at the right IJ. The hemodialysis catheter was then back tunneled from the right chest wall incision to the dermatotomy at the right neck. The venous access site was then serially dilated and a peel away vascular sheath placed over the wire. The wire was removed and the catheter was placed through the peel-away sheath. The catheter tip is positioned in the upper right atrium. This was documented with a spot image. Both ports of the hemodialysis catheter were then tested for excellent function. The ports were then locked with heparinized lock. The incision at the neck was then sealed with Dermabond. Patient tolerated the procedure well and remained hemodynamically stable throughout. No complications were encountered and no significant blood loss was encountered. Marland Kitchen FINDINGS: After catheter placement, the tip lies within the superior cavoatrial junction. The catheter aspirates and flushes normally and is ready for immediate use. IMPRESSION: Status post right IJ tunneled 23 cm HD catheter placement. Catheter ready for use. Signed, Dulcy Fanny. Earleen Newport, DO [facility] Radiology Electronically Signed   By: Corrie Mckusick  D.O.   On: 10/22/2017 11:54   Dg Abd Portable 2v  Result Date: 10/23/2017 CLINICAL DATA:  Vomiting EXAM: PORTABLE ABDOMEN - 2 VIEW COMPARISON:  None. FINDINGS: No dilated small bowel loops or significant air-fluid levels. Moderate stool throughout the large bowel. No evidence of pneumatosis or pneumoperitoneum. No radiopaque nephrolithiasis. Hazy opacities at the lung bases with possible small bilateral pleural effusions. Right-sided MediPort terminates in the right atrium. IMPRESSION: Nonobstructive bowel gas pattern. Moderate colonic stool volume, which may indicate constipation. Hazy opacities at the lung bases with possible small left pleural effusion, correlate with chest radiographs as clinically warranted. Electronically Signed   By: Ilona Sorrel M.D.   On: 10/23/2017 10:06   Medications: Infusions: . sodium chloride Stopped (10/21/17 1108)  . ferric gluconate (FERRLECIT/NULECIT)  IV Stopped (10/22/17 0000)    Scheduled Medications: . amLODipine  5 mg Oral QHS  . calcitRIOL  0.25 mcg Oral Q M,W,F-1800  . calcium acetate  1,334 mg Oral TID WC  . darbepoetin (ARANESP) injection - DIALYSIS  200 mcg Intravenous Q Tue-HD  . docusate sodium  100 mg Oral BID  . heparin injection (subcutaneous)  5,000 Units Subcutaneous Q8H  . insulin aspart  0-5 Units Subcutaneous QHS  . insulin aspart  0-9 Units Subcutaneous TID WC  . insulin aspart  6 Units Subcutaneous TID WC  . insulin glargine  22 Units Subcutaneous QHS  . isosorbide mononitrate  15 mg Oral Daily  . rOPINIRole  0.25 mg Oral QHS  . rosuvastatin  10 mg Oral QHS  . sodium chloride flush  3 mL Intravenous Q12H  . Vitamin D (Ergocalciferol)  50,000 Units Oral Q7 days    have reviewed scheduled and prn medications.  Physical Exam: General: more alert, seen on HD Heart: RRR Lungs: mostly clear Abdomen: soft, non tender Extremities: minimal edema Dialysis Access: new right sided pc placed 3/12    10/23/2017,10:54 AM  LOS: 3 days

## 2017-10-23 NOTE — Progress Notes (Signed)
[facility]                                                                              Patient Demographics  Hector Mahoney, is a 47 y.o. male, DOB - 1971/06/11, MGQ:[id number]  Admit date - 10/20/2017   Admitting Physician Hector Canterbury, MD  Outpatient Primary MD for the patient is Hector Borg, MD  Outpatient specialists:   LOS - 3  days   Medical records reviewed and are as summarized below:    Chief Complaint  Patient presents with  . 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  He was recently admitted from 2/15-2/16 with dyspnea and he had his Lasix adjusted and he was eventually started on metolazone.  He has had decreased dyspnea over the last few weeks but has developed dizziness when ambulating.  He had an episode of syncope on the date of admission that resulted in a 3-minute loss of consciousness and some lacerations to his nose and lips.  His labs suggest worsening anemia and renal function.  He may also have had some hypotension related to his blood pressure medications which have since been discontinued.  He was mildly hyperkalemic but this was easily corrected with Kayexalate and he did not have peaked T-waves on ECG.  He was given IV fluids initially but his BUN and creatinine did not improve.  Nephrology has recommended starting HD and he has had tunneled HD catheter placed on 3/12.       Assessment & Plan    Syncope: Multifactorial likely due to orthostatic hypotension related to anemia, uremia, antihypertensives and recently increased diuretics. -Troponins negative, patient was transfused packed RBCs on 3/11 -2D echo on 09/27/17 had shown EF of 60-65% with grade 2 diastolic dysfunction, severe LVH -Labs suggested worsening  anemia and renal function, creatinine 12.5 at the time of admission was 9.2 on 2/16, hyperkalemia with potassium of 5.6.  Hemoglobin of 6.7.   Active Problems: Acute kidney injury on CKD V, now ESRD and started on hemodialysis -Creatinine 12.5 at the time of admission, BUN 130s, anemia, uremia with hyperkalemia, metabolic acidosis -Nephrology and IR was consulted, hemodialysis catheter placed on 3/12 and started on hemodialysis  Nausea and vomiting -Per patient he has been having nausea and vomiting since hemodialysis catheter placed and started on HD, -Abdominal x-ray showed constipation, ?  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, given patient is started on hemodialysis, will eventually need decrease in insulin requirements -Currently CBGs stable, continue current regimen, Lantus 22 units nightly, NovoLog meal coverage and sliding scale insulin    Essential hypertension -BP currently stable, will continue to improve with hemodialysis -Continue Norvasc, labetalol and hydralazine discontinued   Chronic diastolic CHF -EF 26%, hold diuretics, volume management with hemodialysis  Anemia of chronic disease, iron deficiency -Patient was transfused 2 units packed RBC on 3/11, Feraheme given on 3/12, Aranesp per nephrology   Code Status: full  DVT Prophylaxis:  Heparin  Family Communication: Discussed in detail with the patient, all imaging results, lab results explained to the patient    Disposition Plan: Once cleared by nephrology, clip process to be completed  Time Spent in minutes 35  minutes  Procedures:   Tunneled HD catheter placement on 3/12 Hemodialysis  Consultants:   Nephrology Interventional radiology  Antimicrobials:      Medications  Scheduled Meds: . amLODipine  5 mg Oral QHS  . calcitRIOL  0.25 mcg Oral Q  M,W,F-1800  . calcium acetate  1,334 mg Oral TID WC  . darbepoetin (ARANESP) injection - DIALYSIS  200 mcg Intravenous Q Tue-HD  . docusate sodium  100 mg Oral BID  . heparin injection (subcutaneous)  5,000 Units Subcutaneous Q8H  . insulin aspart  0-5 Units Subcutaneous QHS  . insulin aspart  0-9 Units Subcutaneous TID WC  . insulin aspart  6 Units Subcutaneous TID WC  . insulin glargine  22 Units Subcutaneous QHS  . isosorbide mononitrate  15 mg Oral Daily  . lactulose  10 g Oral Once  . rOPINIRole  0.25 mg Oral QHS  . rosuvastatin  10 mg Oral QHS  . sodium chloride flush  3 mL Intravenous Q12H  . Vitamin D (Ergocalciferol)  50,000 Units Oral Q7 days   Continuous Infusions: . sodium chloride Stopped (10/21/17 1108)  . ferric gluconate (FERRLECIT/NULECIT) IV Stopped (10/22/17 0000)   PRN Meds:.acetaminophen **OR** acetaminophen, cyclobenzaprine, MUSCLE RUB, ondansetron **OR** ondansetron (ZOFRAN) IV, polyethylene glycol, traMADol   Antibiotics   Anti-infectives (From admission, onward)   Start     Dose/Rate Route Frequency Ordered Stop   10/23/17 0700  ceFAZolin (ANCEF) IVPB 2g/100 mL premix  Status:  Discontinued     2 g 200 mL/hr over 30 Minutes Intravenous To [facility] 10/22/17 1932 10/23/17 0927   10/22/17 0937  ceFAZolin (ANCEF) 2-4 GM/100ML-% IVPB    Comments:  Roma Kayser  : cabinet override      10/22/17 3299 10/22/17 1023        Subjective:   Hector Mahoney was seen and examined today.  Feeling somewhat miserable, having nausea and vomiting since yesterday after tunneled dialysis cath was placed and HD.  Patient denies dizziness, chest pain, shortness of breath, new weakness, numbess, tingling.  No significant abdominal pain, fevers or chills.  Objective:   Vitals:   10/22/17 2058 10/23/17 0450 10/23/17 1025 10/23/17 1057  BP: (!) 176/89 (!) 155/89 (!) 145/89 (!) 149/93  Pulse: 100 90 100 100  Resp: 19 19 16    Temp: 98.4 F (36.9 C) 98.1  F (36.7 C) 98.1 F (36.7 C)   TempSrc: Oral Oral Oral   SpO2: 91% 94% 96%   Weight: 92.9 kg (204 lb 12.9 oz)  91.4 kg (201 lb 8 oz)   Height:        Intake/Output Summary (Last 24 hours) at 10/23/2017 1200 Last data filed at 10/23/2017 0200 Gross per 24 hour  Intake 553 ml  Output 0 ml  Net 553 ml     Wt Readings from Last 3 Encounters:  10/23/17 91.4 kg (201 lb 8 oz)  10/16/17 94.3 kg (208 lb)  10/07/17 96.2 kg (212 lb)     Exam  General: Alert and oriented x 3, NAD, keeping eyes closed, feeling uncomfortable  Eyes:  HEENT:    Cardiovascular: S1 S2 auscultated, Regular rate and rhythm.  Respiratory: Clear to auscultation bilaterally, no wheezing, rales or rhonchi  Gastrointestinal: Soft, nontender, nondistended, + bowel sounds  Ext: no pedal edema bilaterally  Neuro: no new deficits  Musculoskeletal: No digital cyanosis, clubbing  Skin: No rashes  Psych: anxious, oriented x3   Data Reviewed:  I have personally reviewed following labs and imaging studies  Micro Results No results found for this or any previous visit (from the past 240 hour(s)).  Radiology Reports Dg Chest 2 View  Result Date: 10/20/2017 CLINICAL DATA:  Recent syncopal episode EXAM: CHEST - 2 VIEW COMPARISON:  09/27/2017 FINDINGS: Cardiac shadow is accentuated by the portable technique but stable. The lungs are well aerated bilaterally. Minimal posterior costophrenic angle atelectasis is noted bilaterally. No sizable effusion is seen. No bony abnormality is noted. IMPRESSION: Minimal basilar atelectasis. Electronically Signed   By: Inez Catalina M.D.   On: 10/20/2017 16:01   Dg Chest 2 View  Result Date: 09/27/2017 CLINICAL DATA:  Short of breath EXAM: CHEST  2 VIEW COMPARISON:  09/02/2015 FINDINGS: The heart size and mediastinal contours are within normal limits. Both lungs are clear. The visualized skeletal structures are unremarkable. IMPRESSION: No active cardiopulmonary disease.  Electronically Signed   By: Franchot Gallo M.D.   On: 09/27/2017 07:07   Ct Head Wo Contrast  Result Date: 10/20/2017 CLINICAL DATA:  Dizzy, fell onto concrete. Injury to face. Abrasion to forehead. Pain and swelling to nose and upper lip. Loss of consciousness. EXAM: CT HEAD WITHOUT CONTRAST CT MAXILLOFACIAL WITHOUT CONTRAST CT CERVICAL SPINE WITHOUT CONTRAST TECHNIQUE: Multidetector CT imaging of the head, cervical spine, and maxillofacial structures were performed using the standard protocol without intravenous contrast. Multiplanar CT image reconstructions of the cervical spine and maxillofacial structures were also generated. COMPARISON:  Head CT dated 09/01/2005 FINDINGS: CT HEAD FINDINGS Brain: Ventricles are normal in size and configuration. All areas of the brain demonstrate normal gray-white matter differentiation. There is no mass, hemorrhage, edema or other evidence of acute parenchymal abnormality. No extra-axial hemorrhage. Vascular: There are chronic calcified atherosclerotic changes of the large vessels at the skull base. No unexpected hyperdense vessel. Skull: Normal. Negative for fracture or focal lesion. Other: None. CT MAXILLOFACIAL FINDINGS Osseous: Lower frontal bones are intact and normally aligned. No displaced nasal bone fracture. Osseous structures about the orbits are intact and normally aligned bilaterally. Walls of the maxillary sinuses are intact and normally aligned bilaterally. Bilateral zygoma and pterygoid plates are intact. No mandible fracture or displacement seen. Incidental note made of slight anterior subluxation of the left mandibular condyle relative to the condylar fossa, likely related to chronic degenerative change at the TMJ. Orbits: Orbital globes appear intact and symmetric in position. No retro-orbital hemorrhage or edema. Sinuses: Clear Soft tissues: Soft tissue edema overlying the nasal bones and upper lip. No circumscribed soft tissue hematoma appreciated. CT  CERVICAL SPINE FINDINGS Alignment: Straightening of the normal cervical spine lordosis, likely related to patient positioning or muscle spasm. No evidence of acute vertebral body subluxation. Skull base and vertebrae: No fracture line or displaced fracture fragment. Facet joints appear intact and normally aligned throughout. Soft tissues and spinal canal: No prevertebral fluid or swelling. No visible canal hematoma. Disc levels: Mild disc desiccations at the C5-6 and C6-7 levels, with associated mild disc space narrowings and mild osseous spurring. No more than mild central canal stenosis at any level. No large disc bulge or protrusion seen. Upper chest:  Negative. Other: None IMPRESSION: 1. Negative head CT. No intracranial mass, hemorrhage or edema. No skull fracture. 2. No facial bone fracture or dislocation. Soft tissue edema overlying the nasal bones and upper lip. 3. No fracture or acute subluxation within the cervical spine. Mild degenerative change within the lower cervical spine. Electronically Signed   By: Franki Cabot M.D.   On: 10/20/2017 16:34   Ct Cervical Spine Wo Contrast  Result Date: 10/20/2017 CLINICAL DATA:  Dizzy, fell onto concrete. Injury to face. Abrasion to forehead. Pain and swelling to nose and upper lip. Loss of consciousness. EXAM: CT HEAD WITHOUT CONTRAST CT MAXILLOFACIAL WITHOUT CONTRAST CT CERVICAL SPINE WITHOUT CONTRAST TECHNIQUE: Multidetector CT imaging of the head, cervical spine, and maxillofacial structures were performed using the standard protocol without intravenous contrast. Multiplanar CT image reconstructions of the cervical spine and maxillofacial structures were also generated. COMPARISON:  Head CT dated 09/01/2005 FINDINGS: CT HEAD FINDINGS Brain: Ventricles are normal in size and configuration. All areas of the brain demonstrate normal gray-white matter differentiation. There is no mass, hemorrhage, edema or other evidence of acute parenchymal abnormality. No  extra-axial hemorrhage. Vascular: There are chronic calcified atherosclerotic changes of the large vessels at the skull base. No unexpected hyperdense vessel. Skull: Normal. Negative for fracture or focal lesion. Other: None. CT MAXILLOFACIAL FINDINGS Osseous: Lower frontal bones are intact and normally aligned. No displaced nasal bone fracture. Osseous structures about the orbits are intact and normally aligned bilaterally. Walls of the maxillary sinuses are intact and normally aligned bilaterally. Bilateral zygoma and pterygoid plates are intact. No mandible fracture or displacement seen. Incidental note made of slight anterior subluxation of the left mandibular condyle relative to the condylar fossa, likely related to chronic degenerative change at the TMJ. Orbits: Orbital globes appear intact and symmetric in position. No retro-orbital hemorrhage or edema. Sinuses: Clear Soft tissues: Soft tissue edema overlying the nasal bones and upper lip. No circumscribed soft tissue hematoma appreciated. CT CERVICAL SPINE FINDINGS Alignment: Straightening of the normal cervical spine lordosis, likely related to patient positioning or muscle spasm. No evidence of acute vertebral body subluxation. Skull base and vertebrae: No fracture line or displaced fracture fragment. Facet joints appear intact and normally aligned throughout. Soft tissues and spinal canal: No prevertebral fluid or swelling. No visible canal hematoma. Disc levels: Mild disc desiccations at the C5-6 and C6-7 levels, with associated mild disc space narrowings and mild osseous spurring. No more than mild central canal stenosis at any level. No large disc bulge or protrusion seen. Upper chest: Negative. Other: None IMPRESSION: 1. Negative head CT. No intracranial mass, hemorrhage or edema. No skull fracture. 2. No facial bone fracture or dislocation. Soft tissue edema overlying the nasal bones and upper lip. 3. No fracture or acute subluxation within the  cervical spine. Mild degenerative change within the lower cervical spine. Electronically Signed   By: Franki Cabot M.D.   On: 10/20/2017 16:34   Ir Fluoro Guide Cv Line Right  Result Date: 10/22/2017 INDICATION: 47 year old male with a history end-stage renal disease EXAM: IMAGE GUIDED TUNNELED HEMODIALYSIS CATHETER MEDICATIONS: 2.0 g Ancef. The antibiotic was given in an appropriate time interval prior to skin puncture. ANESTHESIA/SEDATION: Versed 1.0 mg IV; Fentanyl 50 mcg IV; Moderate Sedation Time:  15 minutes The patient was continuously monitored during the procedure by the interventional radiology nurse under my direct supervision. FLUOROSCOPY TIME:  Fluoroscopy Time: 0 minutes 24 seconds (2.6 mGy). COMPLICATIONS: None PROCEDURE: The procedure, risks, benefits, and alternatives were  explained to the patient. Questions regarding the procedure were encouraged and answered. The patient understands and consents to the procedure. The right neck and chest was prepped with chlorhexidine, and draped in the usual sterile fashion using maximum barrier technique (cap and mask, sterile gown, sterile gloves, large sterile sheet, hand hygiene and cutaneous antiseptic). Antibiotic prophylaxis was initiated with 2.0g Ancef administered IV within 1 hour prior to skin incision. Local anesthesia was attained by infiltration with 1% lidocaine without epinephrine. Ultrasound demonstrated patency of the right internal jugular vein, and this was documented with an image. Under real-time ultrasound guidance, this vein was accessed with a 21 gauge micropuncture needle and image documentation was performed. A small dermatotomy was made at the access site with an 11 scalpel. A 0.018" wire was advanced into the SVC and the access needle exchanged for a 18F micropuncture vascular sheath. The 0.018" wire was then removed and a 0.035" wire advanced into the IVC. Upon withdrawal of the 018 wire, the wire was marked for appropriate  length of the internal portion of the catheter. A 23 cm tip to cuff catheter was selected. The skin and subcutaneous tissues of the right chest wall were then generously infiltrated with 1% lidocaine without epinephrine from the chest wall to the puncture site at the right IJ. The hemodialysis catheter was then back tunneled from the right chest wall incision to the dermatotomy at the right neck. The venous access site was then serially dilated and a peel away vascular sheath placed over the wire. The wire was removed and the catheter was placed through the peel-away sheath. The catheter tip is positioned in the upper right atrium. This was documented with a spot image. Both ports of the hemodialysis catheter were then tested for excellent function. The ports were then locked with heparinized lock. The incision at the neck was then sealed with Dermabond. Patient tolerated the procedure well and remained hemodynamically stable throughout. No complications were encountered and no significant blood loss was encountered. Marland Kitchen FINDINGS: After catheter placement, the tip lies within the superior cavoatrial junction. The catheter aspirates and flushes normally and is ready for immediate use. IMPRESSION: Status post right IJ tunneled 23 cm HD catheter placement. Catheter ready for use. Signed, Dulcy Fanny. Earleen Newport, DO [facility] Radiology Electronically Signed   By: Corrie Mckusick D.O.   On: 10/22/2017 11:54   Ir US Guide Vasc Access Right  Result Date: 10/22/2017 INDICATION: 47 year old male with a history end-stage renal disease EXAM: IMAGE GUIDED TUNNELED HEMODIALYSIS CATHETER MEDICATIONS: 2.0 g Ancef. The antibiotic was given in an appropriate time interval prior to skin puncture. ANESTHESIA/SEDATION: Versed 1.0 mg IV; Fentanyl 50 mcg IV; Moderate Sedation Time:  15 minutes The patient was continuously monitored during the procedure by the interventional radiology nurse under  my direct supervision. FLUOROSCOPY TIME:  Fluoroscopy Time: 0 minutes 24 seconds (2.6 mGy). COMPLICATIONS: None PROCEDURE: The procedure, risks, benefits, and alternatives were explained to the patient. Questions regarding the procedure were encouraged and answered. The patient understands and consents to the procedure. The right neck and chest was prepped with chlorhexidine, and draped in the usual sterile fashion using maximum barrier technique (cap and mask, sterile gown, sterile gloves, large sterile sheet, hand hygiene and cutaneous antiseptic). Antibiotic prophylaxis was initiated with 2.0g Ancef administered IV within 1 hour prior to skin incision. Local anesthesia was attained by infiltration with 1% lidocaine without epinephrine. Ultrasound demonstrated patency of the right internal jugular vein, and this  was documented with an image. Under real-time ultrasound guidance, this vein was accessed with a 21 gauge micropuncture needle and image documentation was performed. A small dermatotomy was made at the access site with an 11 scalpel. A 0.018" wire was advanced into the SVC and the access needle exchanged for a 60F micropuncture vascular sheath. The 0.018" wire was then removed and a 0.035" wire advanced into the IVC. Upon withdrawal of the 018 wire, the wire was marked for appropriate length of the internal portion of the catheter. A 23 cm tip to cuff catheter was selected. The skin and subcutaneous tissues of the right chest wall were then generously infiltrated with 1% lidocaine without epinephrine from the chest wall to the puncture site at the right IJ. The hemodialysis catheter was then back tunneled from the right chest wall incision to the dermatotomy at the right neck. The venous access site was then serially dilated and a peel away vascular sheath placed over the wire. The wire was removed and the catheter was placed through the peel-away sheath. The catheter tip is positioned in the upper right  atrium. This was documented with a spot image. Both ports of the hemodialysis catheter were then tested for excellent function. The ports were then locked with heparinized lock. The incision at the neck was then sealed with Dermabond. Patient tolerated the procedure well and remained hemodynamically stable throughout. No complications were encountered and no significant blood loss was encountered. Marland Kitchen FINDINGS: After catheter placement, the tip lies within the superior cavoatrial junction. The catheter aspirates and flushes normally and is ready for immediate use. IMPRESSION: Status post right IJ tunneled 23 cm HD catheter placement. Catheter ready for use. Signed, Dulcy Fanny. Earleen Newport, DO [facility] Radiology Electronically Signed   By: Corrie Mckusick D.O.   On: 10/22/2017 11:54   Dg Abd Portable 2v  Result Date: 10/23/2017 CLINICAL DATA:  Vomiting EXAM: PORTABLE ABDOMEN - 2 VIEW COMPARISON:  None. FINDINGS: No dilated small bowel loops or significant air-fluid levels. Moderate stool throughout the large bowel. No evidence of pneumatosis or pneumoperitoneum. No radiopaque nephrolithiasis. Hazy opacities at the lung bases with possible small bilateral pleural effusions. Right-sided MediPort terminates in the right atrium. IMPRESSION: Nonobstructive bowel gas pattern. Moderate colonic stool volume, which may indicate constipation. Hazy opacities at the lung bases with possible small left pleural effusion, correlate with chest radiographs as clinically warranted. Electronically Signed   By: Ilona Sorrel M.D.   On: 10/23/2017 10:06   Ct Maxillofacial Wo Cm  Result Date: 10/20/2017 CLINICAL DATA:  Dizzy, fell onto concrete. Injury to face. Abrasion to forehead. Pain and swelling to nose and upper lip. Loss of consciousness. EXAM: CT HEAD WITHOUT CONTRAST CT MAXILLOFACIAL WITHOUT CONTRAST CT CERVICAL SPINE WITHOUT CONTRAST TECHNIQUE: Multidetector CT imaging of the head,  cervical spine, and maxillofacial structures were performed using the standard protocol without intravenous contrast. Multiplanar CT image reconstructions of the cervical spine and maxillofacial structures were also generated. COMPARISON:  Head CT dated 09/01/2005 FINDINGS: CT HEAD FINDINGS Brain: Ventricles are normal in size and configuration. All areas of the brain demonstrate normal gray-white matter differentiation. There is no mass, hemorrhage, edema or other evidence of acute parenchymal abnormality. No extra-axial hemorrhage. Vascular: There are chronic calcified atherosclerotic changes of the large vessels at the skull base. No unexpected hyperdense vessel. Skull: Normal. Negative for fracture or focal lesion. Other: None. CT MAXILLOFACIAL FINDINGS Osseous: Lower frontal bones are intact and normally aligned. No displaced  nasal bone fracture. Osseous structures about the orbits are intact and normally aligned bilaterally. Walls of the maxillary sinuses are intact and normally aligned bilaterally. Bilateral zygoma and pterygoid plates are intact. No mandible fracture or displacement seen. Incidental note made of slight anterior subluxation of the left mandibular condyle relative to the condylar fossa, likely related to chronic degenerative change at the TMJ. Orbits: Orbital globes appear intact and symmetric in position. No retro-orbital hemorrhage or edema. Sinuses: Clear Soft tissues: Soft tissue edema overlying the nasal bones and upper lip. No circumscribed soft tissue hematoma appreciated. CT CERVICAL SPINE FINDINGS Alignment: Straightening of the normal cervical spine lordosis, likely related to patient positioning or muscle spasm. No evidence of acute vertebral body subluxation. Skull base and vertebrae: No fracture line or displaced fracture fragment. Facet joints appear intact and normally aligned throughout. Soft tissues and spinal canal: No prevertebral fluid or swelling. No visible canal  hematoma. Disc levels: Mild disc desiccations at the C5-6 and C6-7 levels, with associated mild disc space narrowings and mild osseous spurring. No more than mild central canal stenosis at any level. No large disc bulge or protrusion seen. Upper chest: Negative. Other: None IMPRESSION: 1. Negative head CT. No intracranial mass, hemorrhage or edema. No skull fracture. 2. No facial bone fracture or dislocation. Soft tissue edema overlying the nasal bones and upper lip. 3. No fracture or acute subluxation within the cervical spine. Mild degenerative change within the lower cervical spine. Electronically Signed   By: Franki Cabot M.D.   On: 10/20/2017 16:34    Lab Data:  CBC: Recent Labs  Lab 10/20/17 1510 10/21/17 0703 10/21/17 2339 10/23/17 1131  WBC 10.4 8.9 11.0* 14.0*  NEUTROABS 8.0*  --   --   --   HGB 7.9* 6.7* 9.3* 8.9*  HCT 25.6* 22.1* 29.2* 28.4*  MCV 75.7* 74.9* 75.6* 76.5*  PLT 300 267 262 277   Basic Metabolic Panel: Recent Labs  Lab 10/20/17 1510 10/20/17 1549 10/21/17 0703 10/21/17 2339 10/23/17 1131  NA 134*  --  134* 135 138  K 5.6*  --  4.0 3.8 3.7  CL 97*  --  98* 96* 98*  CO2 22  --  20* 21* 25  GLUCOSE 94  --  199* 208* 121*  BUN 133*  --  136* 133* 77*  CREATININE 12.59*  --  12.69* 12.90* 9.31*  CALCIUM 8.1*  --  7.8* 7.9* 8.4*  MG  --  3.8* 3.0* 2.9*  --   PHOS  --   --  8.0* 7.8* 6.7*   GFR: Estimated Creatinine Clearance: 10.7 mL/min (A) (by C-G formula based on SCr of 9.31 mg/dL (H)). Liver Function Tests: Recent Labs  Lab 10/20/17 1510 10/21/17 0703 10/21/17 2339 10/23/17 1131  AST 45*  --   --   --   ALT 37  --   --   --   ALKPHOS 107  --   --   --   BILITOT 1.1  --   --   --   PROT 7.0  --   --   --   ALBUMIN 3.2* 2.9* 3.2* 2.9*   No results for input(s): LIPASE, AMYLASE in the last 168 hours. No results for input(s): AMMONIA in the last 168 hours. Coagulation Profile: Recent Labs  Lab 10/21/17 0703 10/21/17 2339  INR 1.16 1.05     Cardiac Enzymes: Recent Labs  Lab 10/21/17 2348  TROPONINI 0.03*   BNP (last 3 results) No results for  input(s): PROBNP in the last 8760 hours. HbA1C: No results for input(s): HGBA1C in the last 72 hours. CBG: Recent Labs  Lab 10/21/17 2136 10/22/17 0741 10/22/17 1148 10/22/17 2056 10/23/17 0744  GLUCAP 197* 174* 220* 117* 137*   Lipid Profile: No results for input(s): CHOL, HDL, LDLCALC, TRIG, CHOLHDL, LDLDIRECT in the last 72 hours. Thyroid Function Tests: No results for input(s): TSH, T4TOTAL, FREET4, T3FREE, THYROIDAB in the last 72 hours. Anemia Panel: Recent Labs    10/21/17 0936  FERRITIN 188  TIBC 318  IRON 43*   Urine analysis:    Component Value Date/Time   COLORURINE YELLOW 06/02/2016 0811   APPEARANCEUR CLEAR 06/02/2016 0811   LABSPEC 1.014 06/02/2016 0811   PHURINE 6.0 06/02/2016 0811   GLUCOSEU 500 (A) 06/02/2016 0811   GLUCOSEU >=1000 04/27/2009 1531   HGBUR MODERATE (A) 06/02/2016 0811   HGBUR trace-intact 04/23/2009 0936   BILIRUBINUR NEGATIVE 06/02/2016 0811   KETONESUR NEGATIVE 06/02/2016 0811   PROTEINUR >300 (A) 06/02/2016 0811   UROBILINOGEN 0.2 03/29/2014 1729   NITRITE NEGATIVE 06/02/2016 0811   LEUKOCYTESUR NEGATIVE 06/02/2016 0811     Della Homan M.D. [facility] 10/23/2017, 12:00 PM  Pager: [phone] Between 7am to 7pm - call Pager - [phone]  After 7pm go to [email or web address] - password [email or web address]  Call night coverage person covering after 7pm

## 2017-10-24 DIAGNOSIS — N185 Chronic kidney disease, stage 5: Secondary | ICD-10-CM | POA: Diagnosis not present

## 2017-10-24 DIAGNOSIS — E1022 Type 1 diabetes mellitus with diabetic chronic kidney disease: Secondary | ICD-10-CM | POA: Diagnosis not present

## 2017-10-24 DIAGNOSIS — D631 Anemia in chronic kidney disease: Secondary | ICD-10-CM | POA: Diagnosis not present

## 2017-10-24 DIAGNOSIS — N186 End stage renal disease: Secondary | ICD-10-CM | POA: Diagnosis not present

## 2017-10-24 DIAGNOSIS — I120 Hypertensive chronic kidney disease with stage 5 chronic kidney disease or end stage renal disease: Secondary | ICD-10-CM | POA: Diagnosis not present

## 2017-10-24 DIAGNOSIS — N179 Acute kidney failure, unspecified: Secondary | ICD-10-CM | POA: Diagnosis not present

## 2017-10-24 LAB — CBC
HCT: 31.7 % — ABNORMAL LOW (ref 39.0–52.0)
Hemoglobin: 9.7 g/dL — ABNORMAL LOW (ref 13.0–17.0)
MCH: 24.2 pg — ABNORMAL LOW (ref 26.0–34.0)
MCHC: 30.6 g/dL (ref 30.0–36.0)
MCV: 79.1 fL (ref 78.0–100.0)
Platelets: 261 10*3/uL (ref 150–400)
RBC: 4.01 MIL/uL — ABNORMAL LOW (ref 4.22–5.81)
RDW: 16.8 % — AB (ref 11.5–15.5)
WBC: 14.5 10*3/uL — AB (ref 4.0–10.5)

## 2017-10-24 LAB — RENAL FUNCTION PANEL
Albumin: 2.9 g/dL — ABNORMAL LOW (ref 3.5–5.0)
Anion gap: 13 (ref 5–15)
BUN: 48 mg/dL — AB (ref 6–20)
CHLORIDE: 99 mmol/L — AB (ref 101–111)
CO2: 26 mmol/L (ref 22–32)
Calcium: 8.6 mg/dL — ABNORMAL LOW (ref 8.9–10.3)
Creatinine, Ser: 7.42 mg/dL — ABNORMAL HIGH (ref 0.61–1.24)
GFR, EST AFRICAN AMERICAN: 9 mL/min — AB (ref >60)
GFR, EST NON AFRICAN AMERICAN: 8 mL/min — AB (ref >60)
Glucose, Bld: 111 mg/dL — ABNORMAL HIGH (ref 65–99)
POTASSIUM: 3.9 mmol/L (ref 3.5–5.1)
Phosphorus: 5.5 mg/dL — ABNORMAL HIGH (ref 2.5–4.6)
Sodium: 138 mmol/L (ref 135–145)

## 2017-10-24 LAB — GLUCOSE, CAPILLARY
GLUCOSE-CAPILLARY: 172 mg/dL — AB (ref 65–99)
GLUCOSE-CAPILLARY: 245 mg/dL — AB (ref 65–99)
GLUCOSE-CAPILLARY: 134 mg/dL — AB (ref 65–99)

## 2017-10-24 NOTE — Procedures (Signed)
Patient was seen on dialysis and the procedure was supervised.  BFR 300  Via PC BP is  147/97.   Patient appears to be tolerating treatment well  Hector Mahoney A 10/24/2017

## 2017-10-24 NOTE — Progress Notes (Signed)
Subjective:  HD yest- removed one liter- seen in HD for treatment #3- having nausea and vomiting since yesterday  Objective Vital signs in last 24 hours: Vitals:   10/24/17 0730 10/24/17 0800 10/24/17 0900 10/24/17 0930  BP: (!) 158/92 (!) 147/94 (!) 147/79 (!) 147/97  Pulse: 94 98 98 100  Resp:      Temp:      TempSrc:      SpO2:      Weight:      Height:       Weight change: -1.9 kg (-3 oz)  Intake/Output Summary (Last 24 hours) at 10/24/2017 0947 Last data filed at 10/24/2017 0200 Gross per 24 hour  Intake 360 ml  Output 1250 ml  Net -890 ml    Assessment/Plan: 47 year old black male with known advanced chronic kidney disease now with incident of syncope but with GFR discovered to be less than 5 1.Renal- he had not claimed uremic symptoms til now.  s/p PermCath placement with first treatment 3/12, second 3/13 and third t3/14.  CLIP to OP unit.  Eventually he does want to do peritoneal dialysis.  I told him the recommendation would be for a backup fistula but he is concerned about how that will affect his work and does not consent to that at this time.  I think N/V due to meds for PC and uremia- is resolved \ PRN antiemetics right now.  I have contacted home training to make contact to expedite home visit and PD cath placement.  Awaiting OP placement- as soon as I know we can make a plan for discharge 2. Hypertension/volume  -volume status seems pretty good.  I do think he had a change in blood pressure and that is what brought on his syncope.  I have chosen to discontinue his labetalol and hydralazine and only continue Norvasc.  As dialysis starts I think his blood pressure will improve - establishing EDW  3. Anemia  -advanced and worsening.  This is only contributing to his symptoms.  Hemoglobin was in the eights last week then in the sixes- s/p transfusion 3/11.  Getting IV iron and ESA- seems improved 4.  Bones-he is on calcitriol and PhosLo, those will be continued- stopping his  weekly vitamin D    Sweetie Giebler A    Labs: Basic Metabolic Panel: Recent Labs  Lab 10/21/17 2339 10/23/17 1131 10/24/17 0730  NA 135 138 138  K 3.8 3.7 3.9  CL 96* 98* 99*  CO2 21* 25 26  GLUCOSE 208* 121* 111*  BUN 133* 77* 48*  CREATININE 12.90* 9.31* 7.42*  CALCIUM 7.9* 8.4* 8.6*  PHOS 7.8* 6.7* 5.5*   Liver Function Tests: Recent Labs  Lab 10/20/17 1510  10/21/17 2339 10/23/17 1131 10/24/17 0730  AST 45*  --   --   --   --   ALT 37  --   --   --   --   ALKPHOS 107  --   --   --   --   BILITOT 1.1  --   --   --   --   PROT 7.0  --   --   --   --   ALBUMIN 3.2*   < > 3.2* 2.9* 2.9*   < > = values in this interval not displayed.   No results for input(s): LIPASE, AMYLASE in the last 168 hours. No results for input(s): AMMONIA in the last 168 hours. CBC: Recent Labs  Lab 10/20/17 1510 10/21/17 0703 10/21/17  2339 10/23/17 1131 10/24/17 0730  WBC 10.4 8.9 11.0* 14.0* 14.5*  NEUTROABS 8.0*  --   --   --   --   HGB 7.9* 6.7* 9.3* 8.9* 9.7*  HCT 25.6* 22.1* 29.2* 28.4* 31.7*  MCV 75.7* 74.9* 75.6* 76.5* 79.1  PLT 300 267 262 257 261   Cardiac Enzymes: Recent Labs  Lab 10/21/17 2348  TROPONINI 0.03*   CBG: Recent Labs  Lab 10/22/17 1148 10/22/17 2056 10/23/17 0744 10/23/17 1657 10/23/17 2138  GLUCAP 220* 117* 137* 195* 157*    Iron Studies:  No results for input(s): IRON, TIBC, TRANSFERRIN, FERRITIN in the last 72 hours. Studies/Results: Ir Fluoro Guide Cv Line Right  Result Date: 10/22/2017 INDICATION: 47 year old male with a history end-stage renal disease EXAM: IMAGE GUIDED TUNNELED HEMODIALYSIS CATHETER MEDICATIONS: 2.0 g Ancef. The antibiotic was given in an appropriate time interval prior to skin puncture. ANESTHESIA/SEDATION: Versed 1.0 mg IV; Fentanyl 50 mcg IV; Moderate Sedation Time:  15 minutes The patient was continuously monitored during the procedure by the interventional radiology nurse under my direct supervision.  FLUOROSCOPY TIME:  Fluoroscopy Time: 0 minutes 24 seconds (2.6 mGy). COMPLICATIONS: None PROCEDURE: The procedure, risks, benefits, and alternatives were explained to the patient. Questions regarding the procedure were encouraged and answered. The patient understands and consents to the procedure. The right neck and chest was prepped with chlorhexidine, and draped in the usual sterile fashion using maximum barrier technique (cap and mask, sterile gown, sterile gloves, large sterile sheet, hand hygiene and cutaneous antiseptic). Antibiotic prophylaxis was initiated with 2.0g Ancef administered IV within 1 hour prior to skin incision. Local anesthesia was attained by infiltration with 1% lidocaine without epinephrine. Ultrasound demonstrated patency of the right internal jugular vein, and this was documented with an image. Under real-time ultrasound guidance, this vein was accessed with a 21 gauge micropuncture needle and image documentation was performed. A small dermatotomy was made at the access site with an 11 scalpel. A 0.018" wire was advanced into the SVC and the access needle exchanged for a 67F micropuncture vascular sheath. The 0.018" wire was then removed and a 0.035" wire advanced into the IVC. Upon withdrawal of the 018 wire, the wire was marked for appropriate length of the internal portion of the catheter. A 23 cm tip to cuff catheter was selected. The skin and subcutaneous tissues of the right chest wall were then generously infiltrated with 1% lidocaine without epinephrine from the chest wall to the puncture site at the right IJ. The hemodialysis catheter was then back tunneled from the right chest wall incision to the dermatotomy at the right neck. The venous access site was then serially dilated and a peel away vascular sheath placed over the wire. The wire was removed and the catheter was placed through the peel-away sheath. The catheter tip is positioned in the upper right atrium. This was  documented with a spot image. Both ports of the hemodialysis catheter were then tested for excellent function. The ports were then locked with heparinized lock. The incision at the neck was then sealed with Dermabond. Patient tolerated the procedure well and remained hemodynamically stable throughout. No complications were encountered and no significant blood loss was encountered. Marland Kitchen FINDINGS: After catheter placement, the tip lies within the superior cavoatrial junction. The catheter aspirates and flushes normally and is ready for immediate use. IMPRESSION: Status post right IJ tunneled 23 cm HD catheter placement. Catheter ready for use. Signed, Dulcy Fanny. Earleen Newport, DO [facility] Radiology Electronically Signed   By: Corrie Mckusick D.O.   On: 10/22/2017 11:54   Ir US Guide Vasc Access Right  Result Date: 10/22/2017 INDICATION: 47 year old male with a history end-stage renal disease EXAM: IMAGE GUIDED TUNNELED HEMODIALYSIS CATHETER MEDICATIONS: 2.0 g Ancef. The antibiotic was given in an appropriate time interval prior to skin puncture. ANESTHESIA/SEDATION: Versed 1.0 mg IV; Fentanyl 50 mcg IV; Moderate Sedation Time:  15 minutes The patient was continuously monitored during the procedure by the interventional radiology nurse under my direct supervision. FLUOROSCOPY TIME:  Fluoroscopy Time: 0 minutes 24 seconds (2.6 mGy). COMPLICATIONS: None PROCEDURE: The procedure, risks, benefits, and alternatives were explained to the patient. Questions regarding the procedure were encouraged and answered. The patient understands and consents to the procedure. The right neck and chest was prepped with chlorhexidine, and draped in the usual sterile fashion using maximum barrier technique (cap and mask, sterile gown, sterile gloves, large sterile sheet, hand hygiene and cutaneous antiseptic). Antibiotic prophylaxis was initiated with 2.0g Ancef administered IV within 1 hour prior to  skin incision. Local anesthesia was attained by infiltration with 1% lidocaine without epinephrine. Ultrasound demonstrated patency of the right internal jugular vein, and this was documented with an image. Under real-time ultrasound guidance, this vein was accessed with a 21 gauge micropuncture needle and image documentation was performed. A small dermatotomy was made at the access site with an 11 scalpel. A 0.018" wire was advanced into the SVC and the access needle exchanged for a 39F micropuncture vascular sheath. The 0.018" wire was then removed and a 0.035" wire advanced into the IVC. Upon withdrawal of the 018 wire, the wire was marked for appropriate length of the internal portion of the catheter. A 23 cm tip to cuff catheter was selected. The skin and subcutaneous tissues of the right chest wall were then generously infiltrated with 1% lidocaine without epinephrine from the chest wall to the puncture site at the right IJ. The hemodialysis catheter was then back tunneled from the right chest wall incision to the dermatotomy at the right neck. The venous access site was then serially dilated and a peel away vascular sheath placed over the wire. The wire was removed and the catheter was placed through the peel-away sheath. The catheter tip is positioned in the upper right atrium. This was documented with a spot image. Both ports of the hemodialysis catheter were then tested for excellent function. The ports were then locked with heparinized lock. The incision at the neck was then sealed with Dermabond. Patient tolerated the procedure well and remained hemodynamically stable throughout. No complications were encountered and no significant blood loss was encountered. Marland Kitchen FINDINGS: After catheter placement, the tip lies within the superior cavoatrial junction. The catheter aspirates and flushes normally and is ready for immediate use. IMPRESSION: Status post right IJ tunneled 23 cm HD catheter placement. Catheter  ready for use. Signed, Dulcy Fanny. Earleen Newport, DO [facility] Radiology Electronically Signed   By: Corrie Mckusick D.O.   On: 10/22/2017 11:54   Dg Abd Portable 2v  Result Date: 10/23/2017 CLINICAL DATA:  Vomiting EXAM: PORTABLE ABDOMEN - 2 VIEW COMPARISON:  None. FINDINGS: No dilated small bowel loops or significant air-fluid levels. Moderate stool throughout the large bowel. No evidence of pneumatosis or pneumoperitoneum. No radiopaque nephrolithiasis. Hazy opacities at the lung bases with possible small bilateral pleural effusions. Right-sided MediPort terminates in the right atrium. IMPRESSION: Nonobstructive bowel gas pattern. Moderate colonic stool volume,  which may indicate constipation. Hazy opacities at the lung bases with possible small left pleural effusion, correlate with chest radiographs as clinically warranted. Electronically Signed   By: Ilona Sorrel M.D.   On: 10/23/2017 10:06   Medications: Infusions: . sodium chloride Stopped (10/21/17 1108)  . ferric gluconate (FERRLECIT/NULECIT) IV 250 mg (10/23/17 1337)    Scheduled Medications: . amLODipine  5 mg Oral QHS  . calcitRIOL  0.25 mcg Oral Q M,W,F-1800  . calcium acetate  1,334 mg Oral TID WC  . darbepoetin (ARANESP) injection - DIALYSIS  200 mcg Intravenous Q Tue-HD  . docusate sodium  100 mg Oral BID  . heparin injection (subcutaneous)  5,000 Units Subcutaneous Q8H  . insulin aspart  0-5 Units Subcutaneous QHS  . insulin aspart  0-9 Units Subcutaneous TID WC  . insulin aspart  6 Units Subcutaneous TID WC  . insulin glargine  22 Units Subcutaneous QHS  . isosorbide mononitrate  15 mg Oral Daily  . rOPINIRole  0.25 mg Oral QHS  . rosuvastatin  10 mg Oral QHS  . sodium chloride flush  3 mL Intravenous Q12H  . Vitamin D (Ergocalciferol)  50,000 Units Oral Q7 days    have reviewed scheduled and prn medications.  Physical Exam: General: more alert, seen on HD Heart: RRR Lungs:  mostly clear Abdomen: soft, non tender Extremities: minimal edema Dialysis Access: new right sided pc placed 3/12    10/24/2017,9:47 AM  LOS: 4 days

## 2017-10-24 NOTE — Progress Notes (Signed)
[facility]                                                                              Patient Demographics  Hector Mahoney, is a 47 y.o. male, DOB - 17-Jan-1971, PJA:[id number]  Admit date - 10/20/2017   Admitting Physician Janece Canterbury, MD  Outpatient Primary MD for the patient is Biagio Borg, MD  Outpatient specialists:   LOS - 4  days   Medical records reviewed and are as summarized below:    Chief Complaint  Patient presents with  . 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  He was recently admitted from 2/15-2/16 with dyspnea and he had his Lasix adjusted and he was eventually started on metolazone.  He has had decreased dyspnea over the last few weeks but has developed dizziness when ambulating.  He had an episode of syncope on the date of admission that resulted in a 3-minute loss of consciousness and some lacerations to his nose and lips.  His labs suggest worsening anemia and renal function.  He may also have had some hypotension related to his blood pressure medications which have since been discontinued.  He was mildly hyperkalemic but this was easily corrected with Kayexalate and he did not have peaked T-waves on ECG.  He was given IV fluids initially but his BUN and creatinine did not improve.  Nephrology has recommended starting HD and he has had tunneled HD catheter placed on 3/12.       Assessment & Plan    Syncope: Multifactorial likely due to orthostatic hypotension related to anemia, uremia, antihypertensives and recently increased diuretics. -Troponins negative, patient was transfused packed RBCs on 3/11 -2D echo on 09/27/17 had shown EF of 60-65% with grade 2 diastolic dysfunction, severe LVH -Labs suggested worsening  anemia and renal function, creatinine 12.5 at the time of admission was 9.2 on 2/16, hyperkalemia with potassium of 5.6.  Hemoglobin of 6.7. -Currently stable, no acute issues   Active Problems: Acute kidney injury on CKD V, now ESRD and started on hemodialysis -Creatinine 12.5 at the time of admission, BUN 130s, anemia, uremia with hyperkalemia, metabolic acidosis -Nephrology and IR was consulted, hemodialysis catheter placed on 3/12 and started on hemodialysis on 3/12, then 3/13, 3/14 today.  Patient tolerating hemodialysis well  Nausea and vomiting -Resolved, possibly related to anesthesia. 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      Essential hypertension -BP currently stable, will continue to improve with hemodialysis -Continue Norvasc, - labetalol and hydralazine discontinued   Chronic diastolic CHF -EF 34%, hold diuretics, volume management with hemodialysis  Anemia of chronic disease, iron deficiency -Patient was transfused 2  units packed RBC on 3/11, Feraheme given on 3/12, Aranesp per nephrology  Code Status: full  DVT Prophylaxis:  Heparin  Family Communication: Discussed in detail with the patient, all imaging results, lab results explained to the patient    Disposition Plan: Once cleared by nephrology, needs CLIP process completed    Time Spent in minutes  25  minutes  Procedures:   Tunneled HD catheter placement on 3/12 Hemodialysis  Consultants:   Nephrology Interventional radiology  Antimicrobials:      Medications  Scheduled Meds: . amLODipine  5 mg Oral QHS  . calcitRIOL  0.25 mcg Oral Q M,W,F-1800  . calcium acetate  1,334  mg Oral TID WC  . darbepoetin (ARANESP) injection - DIALYSIS  200 mcg Intravenous Q Tue-HD  . docusate sodium  100 mg Oral BID  . heparin injection (subcutaneous)  5,000 Units Subcutaneous Q8H  . insulin aspart  0-5 Units Subcutaneous QHS  . insulin aspart  0-9 Units Subcutaneous TID WC  . insulin aspart  6 Units Subcutaneous TID WC  . insulin glargine  22 Units Subcutaneous QHS  . isosorbide mononitrate  15 mg Oral Daily  . rOPINIRole  0.25 mg Oral QHS  . rosuvastatin  10 mg Oral QHS  . sodium chloride flush  3 mL Intravenous Q12H   Continuous Infusions: . sodium chloride Stopped (10/21/17 1108)  . ferric gluconate (FERRLECIT/NULECIT) IV 250 mg (10/24/17 1233)   PRN Meds:.acetaminophen **OR** acetaminophen, cyclobenzaprine, MUSCLE RUB, ondansetron **OR** ondansetron (ZOFRAN) IV, polyethylene glycol, traMADol   Antibiotics   Anti-infectives (From admission, onward)   Start     Dose/Rate Route Frequency Ordered Stop   10/23/17 0700  ceFAZolin (ANCEF) IVPB 2g/100 mL premix  Status:  Discontinued     2 g 200 mL/hr over 30 Minutes Intravenous To [facility] 10/22/17 1932 10/23/17 0927   10/22/17 0937  ceFAZolin (ANCEF) 2-4 GM/100ML-% IVPB    Comments:  Roma Kayser  : cabinet override      10/22/17 2094 10/22/17 1023        Subjective:   [name] was seen and examined today.  No complaints today, feeling better, no nausea and vomiting.  Tolerating hemodialysis.  Patient denies dizziness, chest pain, shortness of breath, new weakness, numbess, tingling.  No significant abdominal pain, fevers or chills.  Objective:   Vitals:   10/24/17 0900 10/24/17 0930 10/24/17 1000 10/24/17 1029  BP: (!) 147/79 (!) 147/97 133/64 126/80  Pulse: 98 100 95 99  Resp:    18  Temp:    98 F (36.7 C)  TempSrc:    Oral  SpO2:    98%  Weight:    88.2 kg (194 lb 7.1 oz)  Height:        Intake/Output Summary (Last 24 hours) at 10/24/2017 1310 Last data filed at 10/24/2017  1029 Gross per 24 hour  Intake 360 ml  Output 3250 ml  Net -2890 ml     Wt Readings from Last 3 Encounters:  10/24/17 88.2 kg (194 lb 7.1 oz)  10/16/17 94.3 kg (208 lb)  10/07/17 96.2 kg (212 lb)     Exam   General: Alert and oriented x 3, NAD  Eyes:   HEENT:    Cardiovascular: S1 S2 auscultated, Regular rate and rhythm. No pedal edema b/l  Respiratory: Clear to auscultation bilaterally, no wheezing, rales or rhonchi  Gastrointestinal: Soft, nontender, nondistended, + bowel sounds  Ext: no pedal edema bilaterally  Neuro: no new deficits  Musculoskeletal:  No digital cyanosis, clubbing  Skin: No rashes  Psych: Normal affect and demeanor, alert and oriented x3    Data Reviewed:  I have personally reviewed following labs and imaging studies  Micro Results No results found for this or any previous visit (from the past 240 hour(s)).  Radiology Reports Dg Chest 2 View  Result Date: 10/20/2017 CLINICAL DATA:  Recent syncopal episode EXAM: CHEST - 2 VIEW COMPARISON:  09/27/2017 FINDINGS: Cardiac shadow is accentuated by the portable technique but stable. The lungs are well aerated bilaterally. Minimal posterior costophrenic angle atelectasis is noted bilaterally. No sizable effusion is seen. No bony abnormality is noted. IMPRESSION: Minimal basilar atelectasis. Electronically Signed   By: Inez Catalina M.D.   On: 10/20/2017 16:01   Dg Chest 2 View  Result Date: 09/27/2017 CLINICAL DATA:  Short of breath EXAM: CHEST  2 VIEW COMPARISON:  09/02/2015 FINDINGS: The heart size and mediastinal contours are within normal limits. Both lungs are clear. The visualized skeletal structures are unremarkable. IMPRESSION: No active cardiopulmonary disease. Electronically Signed   By: Franchot Gallo M.D.   On: 09/27/2017 07:07   Ct Head Wo Contrast  Result Date: 10/20/2017 CLINICAL DATA:  Dizzy, fell onto concrete. Injury to face. Abrasion to forehead. Pain and swelling to nose and  upper lip. Loss of consciousness. EXAM: CT HEAD WITHOUT CONTRAST CT MAXILLOFACIAL WITHOUT CONTRAST CT CERVICAL SPINE WITHOUT CONTRAST TECHNIQUE: Multidetector CT imaging of the head, cervical spine, and maxillofacial structures were performed using the standard protocol without intravenous contrast. Multiplanar CT image reconstructions of the cervical spine and maxillofacial structures were also generated. COMPARISON:  Head CT dated 09/01/2005 FINDINGS: CT HEAD FINDINGS Brain: Ventricles are normal in size and configuration. All areas of the brain demonstrate normal gray-white matter differentiation. There is no mass, hemorrhage, edema or other evidence of acute parenchymal abnormality. No extra-axial hemorrhage. Vascular: There are chronic calcified atherosclerotic changes of the large vessels at the skull base. No unexpected hyperdense vessel. Skull: Normal. Negative for fracture or focal lesion. Other: None. CT MAXILLOFACIAL FINDINGS Osseous: Lower frontal bones are intact and normally aligned. No displaced nasal bone fracture. Osseous structures about the orbits are intact and normally aligned bilaterally. Walls of the maxillary sinuses are intact and normally aligned bilaterally. Bilateral zygoma and pterygoid plates are intact. No mandible fracture or displacement seen. Incidental note made of slight anterior subluxation of the left mandibular condyle relative to the condylar fossa, likely related to chronic degenerative change at the TMJ. Orbits: Orbital globes appear intact and symmetric in position. No retro-orbital hemorrhage or edema. Sinuses: Clear Soft tissues: Soft tissue edema overlying the nasal bones and upper lip. No circumscribed soft tissue hematoma appreciated. CT CERVICAL SPINE FINDINGS Alignment: Straightening of the normal cervical spine lordosis, likely related to patient positioning or muscle spasm. No evidence of acute vertebral body subluxation. Skull base and vertebrae: No fracture line  or displaced fracture fragment. Facet joints appear intact and normally aligned throughout. Soft tissues and spinal canal: No prevertebral fluid or swelling. No visible canal hematoma. Disc levels: Mild disc desiccations at the C5-6 and C6-7 levels, with associated mild disc space narrowings and mild osseous spurring. No more than mild central canal stenosis at any level. No large disc bulge or protrusion seen. Upper chest: Negative. Other: None IMPRESSION: 1. Negative head CT. No intracranial mass, hemorrhage or edema. No skull fracture. 2. No facial bone fracture or dislocation. Soft tissue edema overlying the nasal bones and upper lip. 3. No fracture or  acute subluxation within the cervical spine. Mild degenerative change within the lower cervical spine. Electronically Signed   By: Franki Cabot M.D.   On: 10/20/2017 16:34   Ct Cervical Spine Wo Contrast  Result Date: 10/20/2017 CLINICAL DATA:  Dizzy, fell onto concrete. Injury to face. Abrasion to forehead. Pain and swelling to nose and upper lip. Loss of consciousness. EXAM: CT HEAD WITHOUT CONTRAST CT MAXILLOFACIAL WITHOUT CONTRAST CT CERVICAL SPINE WITHOUT CONTRAST TECHNIQUE: Multidetector CT imaging of the head, cervical spine, and maxillofacial structures were performed using the standard protocol without intravenous contrast. Multiplanar CT image reconstructions of the cervical spine and maxillofacial structures were also generated. COMPARISON:  Head CT dated 09/01/2005 FINDINGS: CT HEAD FINDINGS Brain: Ventricles are normal in size and configuration. All areas of the brain demonstrate normal gray-white matter differentiation. There is no mass, hemorrhage, edema or other evidence of acute parenchymal abnormality. No extra-axial hemorrhage. Vascular: There are chronic calcified atherosclerotic changes of the large vessels at the skull base. No unexpected hyperdense vessel. Skull: Normal. Negative for fracture or focal lesion. Other: None. CT  MAXILLOFACIAL FINDINGS Osseous: Lower frontal bones are intact and normally aligned. No displaced nasal bone fracture. Osseous structures about the orbits are intact and normally aligned bilaterally. Walls of the maxillary sinuses are intact and normally aligned bilaterally. Bilateral zygoma and pterygoid plates are intact. No mandible fracture or displacement seen. Incidental note made of slight anterior subluxation of the left mandibular condyle relative to the condylar fossa, likely related to chronic degenerative change at the TMJ. Orbits: Orbital globes appear intact and symmetric in position. No retro-orbital hemorrhage or edema. Sinuses: Clear Soft tissues: Soft tissue edema overlying the nasal bones and upper lip. No circumscribed soft tissue hematoma appreciated. CT CERVICAL SPINE FINDINGS Alignment: Straightening of the normal cervical spine lordosis, likely related to patient positioning or muscle spasm. No evidence of acute vertebral body subluxation. Skull base and vertebrae: No fracture line or displaced fracture fragment. Facet joints appear intact and normally aligned throughout. Soft tissues and spinal canal: No prevertebral fluid or swelling. No visible canal hematoma. Disc levels: Mild disc desiccations at the C5-6 and C6-7 levels, with associated mild disc space narrowings and mild osseous spurring. No more than mild central canal stenosis at any level. No large disc bulge or protrusion seen. Upper chest: Negative. Other: None IMPRESSION: 1. Negative head CT. No intracranial mass, hemorrhage or edema. No skull fracture. 2. No facial bone fracture or dislocation. Soft tissue edema overlying the nasal bones and upper lip. 3. No fracture or acute subluxation within the cervical spine. Mild degenerative change within the lower cervical spine. Electronically Signed   By: Franki Cabot M.D.   On: 10/20/2017 16:34   Ir Fluoro Guide Cv Line Right  Result Date: 10/22/2017 INDICATION: 47 year old male  with a history end-stage renal disease EXAM: IMAGE GUIDED TUNNELED HEMODIALYSIS CATHETER MEDICATIONS: 2.0 g Ancef. The antibiotic was given in an appropriate time interval prior to skin puncture. ANESTHESIA/SEDATION: Versed 1.0 mg IV; Fentanyl 50 mcg IV; Moderate Sedation Time:  15 minutes The patient was continuously monitored during the procedure by the interventional radiology nurse under my direct supervision. FLUOROSCOPY TIME:  Fluoroscopy Time: 0 minutes 24 seconds (2.6 mGy). COMPLICATIONS: None PROCEDURE: The procedure, risks, benefits, and alternatives were explained to the patient. Questions regarding the procedure were encouraged and answered. The patient understands and consents to the procedure. The right neck and chest was prepped with chlorhexidine, and draped in the usual sterile fashion using maximum  barrier technique (cap and mask, sterile gown, sterile gloves, large sterile sheet, hand hygiene and cutaneous antiseptic). Antibiotic prophylaxis was initiated with 2.0g Ancef administered IV within 1 hour prior to skin incision. Local anesthesia was attained by infiltration with 1% lidocaine without epinephrine. Ultrasound demonstrated patency of the right internal jugular vein, and this was documented with an image. Under real-time ultrasound guidance, this vein was accessed with a 21 gauge micropuncture needle and image documentation was performed. A small dermatotomy was made at the access site with an 11 scalpel. A 0.018" wire was advanced into the SVC and the access needle exchanged for a 1F micropuncture vascular sheath. The 0.018" wire was then removed and a 0.035" wire advanced into the IVC. Upon withdrawal of the 018 wire, the wire was marked for appropriate length of the internal portion of the catheter. A 23 cm tip to cuff catheter was selected. The skin and subcutaneous tissues of the right chest wall were then generously infiltrated with 1% lidocaine without epinephrine from the chest wall  to the puncture site at the right IJ. The hemodialysis catheter was then back tunneled from the right chest wall incision to the dermatotomy at the right neck. The venous access site was then serially dilated and a peel away vascular sheath placed over the wire. The wire was removed and the catheter was placed through the peel-away sheath. The catheter tip is positioned in the upper right atrium. This was documented with a spot image. Both ports of the hemodialysis catheter were then tested for excellent function. The ports were then locked with heparinized lock. The incision at the neck was then sealed with Dermabond. Patient tolerated the procedure well and remained hemodynamically stable throughout. No complications were encountered and no significant blood loss was encountered. Marland Kitchen FINDINGS: After catheter placement, the tip lies within the superior cavoatrial junction. The catheter aspirates and flushes normally and is ready for immediate use. IMPRESSION: Status post right IJ tunneled 23 cm HD catheter placement. Catheter ready for use. Signed, Dulcy Fanny. Earleen Newport, DO [facility] Radiology Electronically Signed   By: Corrie Mckusick D.O.   On: 10/22/2017 11:54   Ir US Guide Vasc Access Right  Result Date: 10/22/2017 INDICATION: 47 year old male with a history end-stage renal disease EXAM: IMAGE GUIDED TUNNELED HEMODIALYSIS CATHETER MEDICATIONS: 2.0 g Ancef. The antibiotic was given in an appropriate time interval prior to skin puncture. ANESTHESIA/SEDATION: Versed 1.0 mg IV; Fentanyl 50 mcg IV; Moderate Sedation Time:  15 minutes The patient was continuously monitored during the procedure by the interventional radiology nurse under my direct supervision. FLUOROSCOPY TIME:  Fluoroscopy Time: 0 minutes 24 seconds (2.6 mGy). COMPLICATIONS: None PROCEDURE: The procedure, risks, benefits, and alternatives were explained to the patient. Questions regarding the procedure  were encouraged and answered. The patient understands and consents to the procedure. The right neck and chest was prepped with chlorhexidine, and draped in the usual sterile fashion using maximum barrier technique (cap and mask, sterile gown, sterile gloves, large sterile sheet, hand hygiene and cutaneous antiseptic). Antibiotic prophylaxis was initiated with 2.0g Ancef administered IV within 1 hour prior to skin incision. Local anesthesia was attained by infiltration with 1% lidocaine without epinephrine. Ultrasound demonstrated patency of the right internal jugular vein, and this was documented with an image. Under real-time ultrasound guidance, this vein was accessed with a 21 gauge micropuncture needle and image documentation was performed. A small dermatotomy was made at the access site with an 11 scalpel. A  0.018" wire was advanced into the SVC and the access needle exchanged for a 72F micropuncture vascular sheath. The 0.018" wire was then removed and a 0.035" wire advanced into the IVC. Upon withdrawal of the 018 wire, the wire was marked for appropriate length of the internal portion of the catheter. A 23 cm tip to cuff catheter was selected. The skin and subcutaneous tissues of the right chest wall were then generously infiltrated with 1% lidocaine without epinephrine from the chest wall to the puncture site at the right IJ. The hemodialysis catheter was then back tunneled from the right chest wall incision to the dermatotomy at the right neck. The venous access site was then serially dilated and a peel away vascular sheath placed over the wire. The wire was removed and the catheter was placed through the peel-away sheath. The catheter tip is positioned in the upper right atrium. This was documented with a spot image. Both ports of the hemodialysis catheter were then tested for excellent function. The ports were then locked with heparinized lock. The incision at the neck was then sealed with Dermabond.  Patient tolerated the procedure well and remained hemodynamically stable throughout. No complications were encountered and no significant blood loss was encountered. Marland Kitchen FINDINGS: After catheter placement, the tip lies within the superior cavoatrial junction. The catheter aspirates and flushes normally and is ready for immediate use. IMPRESSION: Status post right IJ tunneled 23 cm HD catheter placement. Catheter ready for use. Signed, Dulcy Fanny. Earleen Newport, DO [facility] Radiology Electronically Signed   By: Corrie Mckusick D.O.   On: 10/22/2017 11:54   Dg Abd Portable 2v  Result Date: 10/23/2017 CLINICAL DATA:  Vomiting EXAM: PORTABLE ABDOMEN - 2 VIEW COMPARISON:  None. FINDINGS: No dilated small bowel loops or significant air-fluid levels. Moderate stool throughout the large bowel. No evidence of pneumatosis or pneumoperitoneum. No radiopaque nephrolithiasis. Hazy opacities at the lung bases with possible small bilateral pleural effusions. Right-sided MediPort terminates in the right atrium. IMPRESSION: Nonobstructive bowel gas pattern. Moderate colonic stool volume, which may indicate constipation. Hazy opacities at the lung bases with possible small left pleural effusion, correlate with chest radiographs as clinically warranted. Electronically Signed   By: Ilona Sorrel M.D.   On: 10/23/2017 10:06   Ct Maxillofacial Wo Cm  Result Date: 10/20/2017 CLINICAL DATA:  Dizzy, fell onto concrete. Injury to face. Abrasion to forehead. Pain and swelling to nose and upper lip. Loss of consciousness. EXAM: CT HEAD WITHOUT CONTRAST CT MAXILLOFACIAL WITHOUT CONTRAST CT CERVICAL SPINE WITHOUT CONTRAST TECHNIQUE: Multidetector CT imaging of the head, cervical spine, and maxillofacial structures were performed using the standard protocol without intravenous contrast. Multiplanar CT image reconstructions of the cervical spine and maxillofacial structures were also generated.  COMPARISON:  Head CT dated 09/01/2005 FINDINGS: CT HEAD FINDINGS Brain: Ventricles are normal in size and configuration. All areas of the brain demonstrate normal gray-white matter differentiation. There is no mass, hemorrhage, edema or other evidence of acute parenchymal abnormality. No extra-axial hemorrhage. Vascular: There are chronic calcified atherosclerotic changes of the large vessels at the skull base. No unexpected hyperdense vessel. Skull: Normal. Negative for fracture or focal lesion. Other: None. CT MAXILLOFACIAL FINDINGS Osseous: Lower frontal bones are intact and normally aligned. No displaced nasal bone fracture. Osseous structures about the orbits are intact and normally aligned bilaterally. Walls of the maxillary sinuses are intact and normally aligned bilaterally. Bilateral zygoma and pterygoid plates are intact. No mandible fracture or displacement seen.  Incidental note made of slight anterior subluxation of the left mandibular condyle relative to the condylar fossa, likely related to chronic degenerative change at the TMJ. Orbits: Orbital globes appear intact and symmetric in position. No retro-orbital hemorrhage or edema. Sinuses: Clear Soft tissues: Soft tissue edema overlying the nasal bones and upper lip. No circumscribed soft tissue hematoma appreciated. CT CERVICAL SPINE FINDINGS Alignment: Straightening of the normal cervical spine lordosis, likely related to patient positioning or muscle spasm. No evidence of acute vertebral body subluxation. Skull base and vertebrae: No fracture line or displaced fracture fragment. Facet joints appear intact and normally aligned throughout. Soft tissues and spinal canal: No prevertebral fluid or swelling. No visible canal hematoma. Disc levels: Mild disc desiccations at the C5-6 and C6-7 levels, with associated mild disc space narrowings and mild osseous spurring. No more than mild central canal stenosis at any level. No large disc bulge or protrusion  seen. Upper chest: Negative. Other: None IMPRESSION: 1. Negative head CT. No intracranial mass, hemorrhage or edema. No skull fracture. 2. No facial bone fracture or dislocation. Soft tissue edema overlying the nasal bones and upper lip. 3. No fracture or acute subluxation within the cervical spine. Mild degenerative change within the lower cervical spine. Electronically Signed   By: Franki Cabot M.D.   On: 10/20/2017 16:34    Lab Data:  CBC: Recent Labs  Lab 10/20/17 1510 10/21/17 0703 10/21/17 2339 10/23/17 1131 10/24/17 0730  WBC 10.4 8.9 11.0* 14.0* 14.5*  NEUTROABS 8.0*  --   --   --   --   HGB 7.9* 6.7* 9.3* 8.9* 9.7*  HCT 25.6* 22.1* 29.2* 28.4* 31.7*  MCV 75.7* 74.9* 75.6* 76.5* 79.1  PLT 300 267 262 257 381   Basic Metabolic Panel: Recent Labs  Lab 10/20/17 1510 10/20/17 1549 10/21/17 0703 10/21/17 2339 10/23/17 1131 10/24/17 0730  NA 134*  --  134* 135 138 138  K 5.6*  --  4.0 3.8 3.7 3.9  CL 97*  --  98* 96* 98* 99*  CO2 22  --  20* 21* 25 26  GLUCOSE 94  --  199* 208* 121* 111*  BUN 133*  --  136* 133* 77* 48*  CREATININE 12.59*  --  12.69* 12.90* 9.31* 7.42*  CALCIUM 8.1*  --  7.8* 7.9* 8.4* 8.6*  MG  --  3.8* 3.0* 2.9*  --   --   PHOS  --   --  8.0* 7.8* 6.7* 5.5*   GFR: Estimated Creatinine Clearance: 13.2 mL/min (A) (by C-G formula based on SCr of 7.42 mg/dL (H)). Liver Function Tests: Recent Labs  Lab 10/20/17 1510 10/21/17 0703 10/21/17 2339 10/23/17 1131 10/24/17 0730  AST 45*  --   --   --   --   ALT 37  --   --   --   --   ALKPHOS 107  --   --   --   --   BILITOT 1.1  --   --   --   --   PROT 7.0  --   --   --   --   ALBUMIN 3.2* 2.9* 3.2* 2.9* 2.9*   No results for input(s): LIPASE, AMYLASE in the last 168 hours. No results for input(s): AMMONIA in the last 168 hours. Coagulation Profile: Recent Labs  Lab 10/21/17 0703 10/21/17 2339  INR 1.16 1.05   Cardiac Enzymes: Recent Labs  Lab 10/21/17 2348  TROPONINI 0.03*   BNP  (last 3 results)  No results for input(s): PROBNP in the last 8760 hours. HbA1C: No results for input(s): HGBA1C in the last 72 hours. CBG: Recent Labs  Lab 10/22/17 2056 10/23/17 0744 10/23/17 1657 10/23/17 2138 10/24/17 1143  GLUCAP 117* 137* 195* 157* 172*   Lipid Profile: No results for input(s): CHOL, HDL, LDLCALC, TRIG, CHOLHDL, LDLDIRECT in the last 72 hours. Thyroid Function Tests: No results for input(s): TSH, T4TOTAL, FREET4, T3FREE, THYROIDAB in the last 72 hours. Anemia Panel: No results for input(s): VITAMINB12, FOLATE, FERRITIN, TIBC, IRON, RETICCTPCT in the last 72 hours. Urine analysis:    Component Value Date/Time   COLORURINE YELLOW 06/02/2016 0811   APPEARANCEUR CLEAR 06/02/2016 0811   LABSPEC 1.014 06/02/2016 0811   PHURINE 6.0 06/02/2016 0811   GLUCOSEU 500 (A) 06/02/2016 0811   GLUCOSEU >=1000 04/27/2009 1531   HGBUR MODERATE (A) 06/02/2016 0811   HGBUR trace-intact 04/23/2009 0936   BILIRUBINUR NEGATIVE 06/02/2016 0811   KETONESUR NEGATIVE 06/02/2016 0811   PROTEINUR >300 (A) 06/02/2016 0811   UROBILINOGEN 0.2 03/29/2014 1729   NITRITE NEGATIVE 06/02/2016 0811   LEUKOCYTESUR NEGATIVE 06/02/2016 0811     Jaeceon Michelin M.D. [facility] 10/24/2017, 1:10 PM  Pager: [phone] Between 7am to 7pm - call Pager - [phone]  After 7pm go to [email or web address] - password [email or web address]  Call night coverage person covering after 7pm

## 2017-10-25 DIAGNOSIS — N185 Chronic kidney disease, stage 5: Secondary | ICD-10-CM | POA: Diagnosis not present

## 2017-10-25 DIAGNOSIS — N179 Acute kidney failure, unspecified: Secondary | ICD-10-CM | POA: Diagnosis not present

## 2017-10-25 DIAGNOSIS — D631 Anemia in chronic kidney disease: Secondary | ICD-10-CM | POA: Diagnosis not present

## 2017-10-25 DIAGNOSIS — N186 End stage renal disease: Secondary | ICD-10-CM | POA: Diagnosis not present

## 2017-10-25 DIAGNOSIS — I10 Essential (primary) hypertension: Secondary | ICD-10-CM | POA: Diagnosis not present

## 2017-10-25 DIAGNOSIS — E1022 Type 1 diabetes mellitus with diabetic chronic kidney disease: Secondary | ICD-10-CM | POA: Diagnosis not present

## 2017-10-25 DIAGNOSIS — I120 Hypertensive chronic kidney disease with stage 5 chronic kidney disease or end stage renal disease: Secondary | ICD-10-CM | POA: Diagnosis not present

## 2017-10-25 LAB — GLUCOSE, CAPILLARY
GLUCOSE-CAPILLARY: 166 mg/dL — AB (ref 65–99)
Glucose-Capillary: 149 mg/dL — ABNORMAL HIGH (ref 65–99)

## 2017-10-25 MED ORDER — POLYETHYLENE GLYCOL 3350 17 G PO PACK: 17.0000 g | PACK | Freq: Every day | ORAL | 0 refills | Status: DC | PRN

## 2017-10-25 MED ORDER — TRAMADOL HCL 50 MG PO TABS: 50.0000 mg | ORAL_TABLET | Freq: Three times a day (TID) | ORAL | 0 refills | Status: DC | PRN

## 2017-10-25 MED ORDER — ROSUVASTATIN CALCIUM 10 MG PO TABS: 10.0000 mg | ORAL_TABLET | Freq: Every day | ORAL | 3 refills | Status: DC

## 2017-10-25 MED ORDER — CALCIUM ACETATE (PHOS BINDER) 667 MG PO CAPS: 1334.0000 mg | ORAL_CAPSULE | Freq: Three times a day (TID) | ORAL | 3 refills | Status: DC

## 2017-10-25 MED ORDER — INSULIN GLARGINE 100 UNIT/ML ~~LOC~~ SOLN: 25.0000 [IU] | Freq: Every day | SUBCUTANEOUS | 5 refills | Status: DC

## 2017-10-25 MED ORDER — MUSCLE RUB 10-15 % EX CREA: 1.0000 "application " | TOPICAL_CREAM | CUTANEOUS | 1 refills | Status: DC | PRN

## 2017-10-25 NOTE — Discharge Summary (Signed)
Physician Discharge Summary   Patient ID: REFUGIO VANDEVOORDE MRN: [id number] DOB/AGE: 01/02/1971 47 y.o.  Admit date: 10/20/2017 Discharge date: 10/25/2017  Primary Care Physician:  Biagio Borg, MD   Recommendations for Outpatient Follow-up:  1. Follow up with PCP in 1-2 weeks 2. Please obtain BMP/CBC in one week 3. Patient is accepted to [facility], Monday Wednesday Friday schedule second shift  Home Health: None  Equipment/Devices: none   Discharge Condition: stable CODE STATUS: FULL  Diet recommendation: Carb modified diet   Discharge Diagnoses:    . 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 Chronic diastolic CHF . Essential hypertension . Type 1 diabetes mellitus (HCC)    Syncope    Anemia of chronic disease   Consults: Nephrology Interventional radiology    Allergies:  No Known Allergies   DISCHARGE MEDICATIONS: Allergies as of 10/25/2017   No Known Allergies     Medication List    STOP taking these medications   furosemide 80 MG tablet Commonly known as:  LASIX   hydrALAZINE 25 MG tablet Commonly known as:  APRESOLINE   labetalol 200 MG tablet Commonly known as:  NORMODYNE   metolazone 5 MG tablet Commonly known as:  ZAROXOLYN     TAKE these medications   amLODipine 5 MG tablet Commonly known as:  NORVASC Take 1 tablet (5 mg total) by mouth daily.   aspirin 81 MG EC tablet Take 1 tablet (81 mg total) by mouth daily.   calcitRIOL 0.25 MCG capsule Commonly known as:  ROCALTROL Take 1 capsule (0.25 mcg total) by mouth every Monday, Wednesday, and Friday at 6 PM.   calcium acetate 667 MG capsule Commonly known as:  PHOSLO Take 2 capsules (1,334 mg total) by mouth 3 (three) times daily with meals.   cyclobenzaprine 10 MG tablet Commonly known as:  FLEXERIL Take 1 tablet (10 mg total) by mouth at bedtime as needed for muscle  spasms.   ferrous sulfate 325 (65 FE) MG tablet Take 1 tablet (325 mg total) by mouth 2 (two) times daily with a meal.   insulin aspart 100 UNIT/ML injection Commonly known as:  novoLOG Inject 8-12 Units into the skin 3 (three) times daily with meals. What changed:  additional instructions   insulin glargine 100 UNIT/ML injection Commonly known as:  LANTUS Inject 0.25 mLs (25 Units total) into the skin at bedtime. What changed:  how much to take   isosorbide mononitrate 30 MG 24 hr tablet Commonly known as:  IMDUR TAKE ONE-HALF TABLET BY MOUTH ONCE DAILY What changed:    how much to take  how to take this  when to take this   MUSCLE RUB 10-15 % Crea Apply 1 application topically as needed for muscle pain (for leg cramps).   polyethylene glycol packet Commonly known as:  MIRALAX / GLYCOLAX Take 17 g by mouth daily as needed for moderate constipation (over the counter).   rOPINIRole 0.25 MG tablet Commonly known as:  REQUIP TAKE 1 TABLET BY MOUTH AT BEDTIME What changed:    how much to take  how to take this  when to take this   rosuvastatin 10 MG tablet Commonly known as:  CRESTOR Take 1 tablet (10 mg total) by mouth daily. What changed:    medication strength  how much to take   traMADol 50 MG tablet Commonly known as:  ULTRAM Take  1 tablet (50 mg total) by mouth every 8 (eight) hours as needed for moderate pain.        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 He was recently admitted from 2/15-2/16 with dyspnea and he had his Lasix adjusted and he was eventually started on metolazone. He has had decreased dyspnea over the last few weeks but has developed dizziness when ambulating. He had an  episode of syncope on the date of admission that resulted in a 3-minute loss of consciousness and some lacerations to his nose and lips. His labs suggest worsening anemia and renal function. He may also have had some hypotension related to his blood pressure medications which have since been discontinued.He was mildly hyperkalemic but this was easily corrected with Kayexalate and he did not have peaked T-waves on ECG.He was given IV fluids initiallybut his BUN and creatininedid not improve.Nephrology has recommended starting HD and he has had tunneled HD catheter placed on 3/12.     Hospital Course:  Syncope: Multifactorial likely due to orthostatic hypotension related to anemia, uremia, antihypertensives and recently increased diuretics. -Troponins negative, patient was transfused packed RBCs on 3/11 -2D echo on 09/27/17 had shown EF of 60-65% with grade 2 diastolic dysfunction, severe LVH -Labs on admission suggested worsening anemia and renal function, creatinine 12.5 at the time of admission was 9.2 on 2/16, hyperkalemia with potassium of 5.6.  Hemoglobin of 6.7. -Currently stable, no acute issues, tolerating hemodialysis   Acute kidney injury on CKD V, now ESRD and started on hemodialysis -Creatinine 12.5 at the time of admission, BUN 130s, anemia, uremia with hyperkalemia, metabolic acidosis -Nephrology and IR was consulted, hemodialysis catheter placed on 3/12 and started on hemodialysis on 3/12, then 3/13, 3/14.  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, given patient is started on hemodialysis, will eventually need decrease in insulin requirements -Continue Lantus and sliding scale insulin     Essential hypertension -BP currently stable, will continue to improve with hemodialysis -Continue Norvasc, - labetalol, Lasix,  metolazone and hydralazine discontinued   Chronic diastolic CHF -EF 63%, hold diuretics, volume management with hemodialysis  Anemia of chronic disease, iron deficiency -Patient was transfused 2 units packed RBC on 3/11, Feraheme given on 3/12, Aranesp per nephrology     Day of Discharge S: Denies any specific complaints, feels a whole lot better from admission, eager to go home  BP (!) 148/95 (BP Location: Left Arm)   Pulse (!) 115   Temp 98 F (36.7 C) (Oral)   Resp 18   Ht 5\' 6"  (1.676 m)   Wt 88.2 kg (194 lb 7.1 oz)   SpO2 97%   BMI 31.38 kg/m   Physical Exam: General: Alert and awake oriented x3 not in any acute distress. HEENT: anicteric sclera, pupils reactive to light and accommodation CVS: S1-S2 clear no murmur rubs or gallops Chest: clear to auscultation bilaterally, no wheezing rales or rhonchi, TDC + Abdomen: soft nontender, nondistended, normal bowel sounds Extremities: no cyanosis, clubbing or edema noted bilaterally Neuro: Cranial nerves II-XII intact, no focal neurological deficits   The results of significant diagnostics from this  hospitalization (including imaging, microbiology, ancillary and laboratory) are listed below for reference.      Procedures/Studies:  Dg Chest 2 View  Result Date: 10/20/2017 CLINICAL DATA:  Recent syncopal episode EXAM: CHEST - 2 VIEW COMPARISON:  09/27/2017 FINDINGS: Cardiac shadow is accentuated by the portable technique but stable. The lungs are well aerated bilaterally. Minimal posterior costophrenic angle atelectasis is noted bilaterally. No sizable effusion is seen. No bony abnormality is noted. IMPRESSION: Minimal basilar atelectasis. Electronically Signed   By: Inez Catalina M.D.   On: 10/20/2017 16:01   Dg Chest 2 View  Result Date: 09/27/2017 CLINICAL DATA:  Short of breath EXAM: CHEST  2 VIEW COMPARISON:  09/02/2015 FINDINGS: The heart size and mediastinal contours are within normal limits. Both lungs are  clear. The visualized skeletal structures are unremarkable. IMPRESSION: No active cardiopulmonary disease. Electronically Signed   By: Franchot Gallo M.D.   On: 09/27/2017 07:07   Ct Head Wo Contrast  Result Date: 10/20/2017 CLINICAL DATA:  Dizzy, fell onto concrete. Injury to face. Abrasion to forehead. Pain and swelling to nose and upper lip. Loss of consciousness. EXAM: CT HEAD WITHOUT CONTRAST CT MAXILLOFACIAL WITHOUT CONTRAST CT CERVICAL SPINE WITHOUT CONTRAST TECHNIQUE: Multidetector CT imaging of the head, cervical spine, and maxillofacial structures were performed using the standard protocol without intravenous contrast. Multiplanar CT image reconstructions of the cervical spine and maxillofacial structures were also generated. COMPARISON:  Head CT dated 09/01/2005 FINDINGS: CT HEAD FINDINGS Brain: Ventricles are normal in size and configuration. All areas of the brain demonstrate normal gray-white matter differentiation. There is no mass, hemorrhage, edema or other evidence of acute parenchymal abnormality. No extra-axial hemorrhage. Vascular: There are chronic calcified atherosclerotic changes of the large vessels at the skull base. No unexpected hyperdense vessel. Skull: Normal. Negative for fracture or focal lesion. Other: None. CT MAXILLOFACIAL FINDINGS Osseous: Lower frontal bones are intact and normally aligned. No displaced nasal bone fracture. Osseous structures about the orbits are intact and normally aligned bilaterally. Walls of the maxillary sinuses are intact and normally aligned bilaterally. Bilateral zygoma and pterygoid plates are intact. No mandible fracture or displacement seen. Incidental note made of slight anterior subluxation of the left mandibular condyle relative to the condylar fossa, likely related to chronic degenerative change at the TMJ. Orbits: Orbital globes appear intact and symmetric in position. No retro-orbital hemorrhage or edema. Sinuses: Clear Soft tissues: Soft  tissue edema overlying the nasal bones and upper lip. No circumscribed soft tissue hematoma appreciated. CT CERVICAL SPINE FINDINGS Alignment: Straightening of the normal cervical spine lordosis, likely related to patient positioning or muscle spasm. No evidence of acute vertebral body subluxation. Skull base and vertebrae: No fracture line or displaced fracture fragment. Facet joints appear intact and normally aligned throughout. Soft tissues and spinal canal: No prevertebral fluid or swelling. No visible canal hematoma. Disc levels: Mild disc desiccations at the C5-6 and C6-7 levels, with associated mild disc space narrowings and mild osseous spurring. No more than mild central canal stenosis at any level. No large disc bulge or protrusion seen. Upper chest: Negative. Other: None IMPRESSION: 1. Negative head CT. No intracranial mass, hemorrhage or edema. No skull fracture. 2. No facial bone fracture or dislocation. Soft tissue edema overlying the nasal bones and upper lip. 3. No fracture or acute subluxation within the cervical spine. Mild degenerative change within the lower cervical spine. Electronically Signed   By: Franki Cabot M.D.   On: 10/20/2017 16:34   Ct Cervical Spine Wo  Contrast  Result Date: 10/20/2017 CLINICAL DATA:  Dizzy, fell onto concrete. Injury to face. Abrasion to forehead. Pain and swelling to nose and upper lip. Loss of consciousness. EXAM: CT HEAD WITHOUT CONTRAST CT MAXILLOFACIAL WITHOUT CONTRAST CT CERVICAL SPINE WITHOUT CONTRAST TECHNIQUE: Multidetector CT imaging of the head, cervical spine, and maxillofacial structures were performed using the standard protocol without intravenous contrast. Multiplanar CT image reconstructions of the cervical spine and maxillofacial structures were also generated. COMPARISON:  Head CT dated 09/01/2005 FINDINGS: CT HEAD FINDINGS Brain: Ventricles are normal in size and configuration. All areas of the brain demonstrate normal gray-white matter  differentiation. There is no mass, hemorrhage, edema or other evidence of acute parenchymal abnormality. No extra-axial hemorrhage. Vascular: There are chronic calcified atherosclerotic changes of the large vessels at the skull base. No unexpected hyperdense vessel. Skull: Normal. Negative for fracture or focal lesion. Other: None. CT MAXILLOFACIAL FINDINGS Osseous: Lower frontal bones are intact and normally aligned. No displaced nasal bone fracture. Osseous structures about the orbits are intact and normally aligned bilaterally. Walls of the maxillary sinuses are intact and normally aligned bilaterally. Bilateral zygoma and pterygoid plates are intact. No mandible fracture or displacement seen. Incidental note made of slight anterior subluxation of the left mandibular condyle relative to the condylar fossa, likely related to chronic degenerative change at the TMJ. Orbits: Orbital globes appear intact and symmetric in position. No retro-orbital hemorrhage or edema. Sinuses: Clear Soft tissues: Soft tissue edema overlying the nasal bones and upper lip. No circumscribed soft tissue hematoma appreciated. CT CERVICAL SPINE FINDINGS Alignment: Straightening of the normal cervical spine lordosis, likely related to patient positioning or muscle spasm. No evidence of acute vertebral body subluxation. Skull base and vertebrae: No fracture line or displaced fracture fragment. Facet joints appear intact and normally aligned throughout. Soft tissues and spinal canal: No prevertebral fluid or swelling. No visible canal hematoma. Disc levels: Mild disc desiccations at the C5-6 and C6-7 levels, with associated mild disc space narrowings and mild osseous spurring. No more than mild central canal stenosis at any level. No large disc bulge or protrusion seen. Upper chest: Negative. Other: None IMPRESSION: 1. Negative head CT. No intracranial mass, hemorrhage or edema. No skull fracture. 2. No facial bone fracture or dislocation.  Soft tissue edema overlying the nasal bones and upper lip. 3. No fracture or acute subluxation within the cervical spine. Mild degenerative change within the lower cervical spine. Electronically Signed   By: Franki Cabot M.D.   On: 10/20/2017 16:34   Ir Fluoro Guide Cv Line Right  Result Date: 10/22/2017 INDICATION: 47 year old male with a history end-stage renal disease EXAM: IMAGE GUIDED TUNNELED HEMODIALYSIS CATHETER MEDICATIONS: 2.0 g Ancef. The antibiotic was given in an appropriate time interval prior to skin puncture. ANESTHESIA/SEDATION: Versed 1.0 mg IV; Fentanyl 50 mcg IV; Moderate Sedation Time:  15 minutes The patient was continuously monitored during the procedure by the interventional radiology nurse under my direct supervision. FLUOROSCOPY TIME:  Fluoroscopy Time: 0 minutes 24 seconds (2.6 mGy). COMPLICATIONS: None PROCEDURE: The procedure, risks, benefits, and alternatives were explained to the patient. Questions regarding the procedure were encouraged and answered. The patient understands and consents to the procedure. The right neck and chest was prepped with chlorhexidine, and draped in the usual sterile fashion using maximum barrier technique (cap and mask, sterile gown, sterile gloves, large sterile sheet, hand hygiene and cutaneous antiseptic). Antibiotic prophylaxis was initiated with 2.0g Ancef administered IV within 1 hour prior to skin incision. Local  anesthesia was attained by infiltration with 1% lidocaine without epinephrine. Ultrasound demonstrated patency of the right internal jugular vein, and this was documented with an image. Under real-time ultrasound guidance, this vein was accessed with a 21 gauge micropuncture needle and image documentation was performed. A small dermatotomy was made at the access site with an 11 scalpel. A 0.018" wire was advanced into the SVC and the access needle exchanged for a 66F micropuncture vascular sheath. The 0.018" wire was then removed and a  0.035" wire advanced into the IVC. Upon withdrawal of the 018 wire, the wire was marked for appropriate length of the internal portion of the catheter. A 23 cm tip to cuff catheter was selected. The skin and subcutaneous tissues of the right chest wall were then generously infiltrated with 1% lidocaine without epinephrine from the chest wall to the puncture site at the right IJ. The hemodialysis catheter was then back tunneled from the right chest wall incision to the dermatotomy at the right neck. The venous access site was then serially dilated and a peel away vascular sheath placed over the wire. The wire was removed and the catheter was placed through the peel-away sheath. The catheter tip is positioned in the upper right atrium. This was documented with a spot image. Both ports of the hemodialysis catheter were then tested for excellent function. The ports were then locked with heparinized lock. The incision at the neck was then sealed with Dermabond. Patient tolerated the procedure well and remained hemodynamically stable throughout. No complications were encountered and no significant blood loss was encountered. Marland Kitchen FINDINGS: After catheter placement, the tip lies within the superior cavoatrial junction. The catheter aspirates and flushes normally and is ready for immediate use. IMPRESSION: Status post right IJ tunneled 23 cm HD catheter placement. Catheter ready for use. Signed, Dulcy Fanny. Earleen Newport, DO [facility] Electronically Signed   By: Corrie Mckusick D.O.   On: 10/22/2017 11:54   Ir US Guide Vasc Access Right  Result Date: 10/22/2017 INDICATION: 47 year old male with a history end-stage renal disease EXAM: IMAGE GUIDED TUNNELED HEMODIALYSIS CATHETER MEDICATIONS: 2.0 g Ancef. The antibiotic was given in an appropriate time interval prior to skin puncture. ANESTHESIA/SEDATION: Versed 1.0 mg IV; Fentanyl 50 mcg IV; Moderate Sedation Time:  15 minutes  The patient was continuously monitored during the procedure by the interventional radiology nurse under my direct supervision. FLUOROSCOPY TIME:  Fluoroscopy Time: 0 minutes 24 seconds (2.6 mGy). COMPLICATIONS: None PROCEDURE: The procedure, risks, benefits, and alternatives were explained to the patient. Questions regarding the procedure were encouraged and answered. The patient understands and consents to the procedure. The right neck and chest was prepped with chlorhexidine, and draped in the usual sterile fashion using maximum barrier technique (cap and mask, sterile gown, sterile gloves, large sterile sheet, hand hygiene and cutaneous antiseptic). Antibiotic prophylaxis was initiated with 2.0g Ancef administered IV within 1 hour prior to skin incision. Local anesthesia was attained by infiltration with 1% lidocaine without epinephrine. Ultrasound demonstrated patency of the right internal jugular vein, and this was documented with an image. Under real-time ultrasound guidance, this vein was accessed with a 21 gauge micropuncture needle and image documentation was performed. A small dermatotomy was made at the access site with an 11 scalpel. A 0.018" wire was advanced into the SVC and the access needle exchanged for a 66F micropuncture vascular sheath. The 0.018" wire was then removed and a 0.035" wire advanced into the IVC. Upon withdrawal  of the 018 wire, the wire was marked for appropriate length of the internal portion of the catheter. A 23 cm tip to cuff catheter was selected. The skin and subcutaneous tissues of the right chest wall were then generously infiltrated with 1% lidocaine without epinephrine from the chest wall to the puncture site at the right IJ. The hemodialysis catheter was then back tunneled from the right chest wall incision to the dermatotomy at the right neck. The venous access site was then serially dilated and a peel away vascular sheath placed over the wire. The wire was removed and  the catheter was placed through the peel-away sheath. The catheter tip is positioned in the upper right atrium. This was documented with a spot image. Both ports of the hemodialysis catheter were then tested for excellent function. The ports were then locked with heparinized lock. The incision at the neck was then sealed with Dermabond. Patient tolerated the procedure well and remained hemodynamically stable throughout. No complications were encountered and no significant blood loss was encountered. Marland Kitchen FINDINGS: After catheter placement, the tip lies within the superior cavoatrial junction. The catheter aspirates and flushes normally and is ready for immediate use. IMPRESSION: Status post right IJ tunneled 23 cm HD catheter placement. Catheter ready for use. Signed, Dulcy Fanny. Earleen Newport, DO [facility] Electronically Signed   By: Corrie Mckusick D.O.   On: 10/22/2017 11:54   Dg Abd Portable 2v  Result Date: 10/23/2017 CLINICAL DATA:  Vomiting EXAM: PORTABLE ABDOMEN - 2 VIEW COMPARISON:  None. FINDINGS: No dilated small bowel loops or significant air-fluid levels. Moderate stool throughout the large bowel. No evidence of pneumatosis or pneumoperitoneum. No radiopaque nephrolithiasis. Hazy opacities at the lung bases with possible small bilateral pleural effusions. Right-sided MediPort terminates in the right atrium. IMPRESSION: Nonobstructive bowel gas pattern. Moderate colonic stool volume, which may indicate constipation. Hazy opacities at the lung bases with possible small left pleural effusion, correlate with chest radiographs as clinically warranted. Electronically Signed   By: Ilona Sorrel M.D.   On: 10/23/2017 10:06   Ct Maxillofacial Wo Cm  Result Date: 10/20/2017 CLINICAL DATA:  Dizzy, fell onto concrete. Injury to face. Abrasion to forehead. Pain and swelling to nose and upper lip. Loss of consciousness. EXAM: CT HEAD WITHOUT CONTRAST CT  MAXILLOFACIAL WITHOUT CONTRAST CT CERVICAL SPINE WITHOUT CONTRAST TECHNIQUE: Multidetector CT imaging of the head, cervical spine, and maxillofacial structures were performed using the standard protocol without intravenous contrast. Multiplanar CT image reconstructions of the cervical spine and maxillofacial structures were also generated. COMPARISON:  Head CT dated 09/01/2005 FINDINGS: CT HEAD FINDINGS Brain: Ventricles are normal in size and configuration. All areas of the brain demonstrate normal gray-white matter differentiation. There is no mass, hemorrhage, edema or other evidence of acute parenchymal abnormality. No extra-axial hemorrhage. Vascular: There are chronic calcified atherosclerotic changes of the large vessels at the skull base. No unexpected hyperdense vessel. Skull: Normal. Negative for fracture or focal lesion. Other: None. CT MAXILLOFACIAL FINDINGS Osseous: Lower frontal bones are intact and normally aligned. No displaced nasal bone fracture. Osseous structures about the orbits are intact and normally aligned bilaterally. Walls of the maxillary sinuses are intact and normally aligned bilaterally. Bilateral zygoma and pterygoid plates are intact. No mandible fracture or displacement seen. Incidental note made of slight anterior subluxation of the left mandibular condyle relative to the condylar fossa, likely related to chronic degenerative change at the TMJ. Orbits: Orbital globes appear intact and symmetric in  position. No retro-orbital hemorrhage or edema. Sinuses: Clear Soft tissues: Soft tissue edema overlying the nasal bones and upper lip. No circumscribed soft tissue hematoma appreciated. CT CERVICAL SPINE FINDINGS Alignment: Straightening of the normal cervical spine lordosis, likely related to patient positioning or muscle spasm. No evidence of acute vertebral body subluxation. Skull base and vertebrae: No fracture line or displaced fracture fragment. Facet joints appear intact and  normally aligned throughout. Soft tissues and spinal canal: No prevertebral fluid or swelling. No visible canal hematoma. Disc levels: Mild disc desiccations at the C5-6 and C6-7 levels, with associated mild disc space narrowings and mild osseous spurring. No more than mild central canal stenosis at any level. No large disc bulge or protrusion seen. Upper chest: Negative. Other: None IMPRESSION: 1. Negative head CT. No intracranial mass, hemorrhage or edema. No skull fracture. 2. No facial bone fracture or dislocation. Soft tissue edema overlying the nasal bones and upper lip. 3. No fracture or acute subluxation within the cervical spine. Mild degenerative change within the lower cervical spine. Electronically Signed   By: Franki Cabot M.D.   On: 10/20/2017 16:34       LAB RESULTS: Basic Metabolic Panel: Recent Labs  Lab 10/21/17 2339 10/23/17 1131 10/24/17 0730  NA 135 138 138  K 3.8 3.7 3.9  CL 96* 98* 99*  CO2 21* 25 26  GLUCOSE 208* 121* 111*  BUN 133* 77* 48*  CREATININE 12.90* 9.31* 7.42*  CALCIUM 7.9* 8.4* 8.6*  MG 2.9*  --   --   PHOS 7.8* 6.7* 5.5*   Liver Function Tests: Recent Labs  Lab 10/20/17 1510  10/23/17 1131 10/24/17 0730  AST 45*  --   --   --   ALT 37  --   --   --   ALKPHOS 107  --   --   --   BILITOT 1.1  --   --   --   PROT 7.0  --   --   --   ALBUMIN 3.2*   < > 2.9* 2.9*   < > = values in this interval not displayed.   No results for input(s): LIPASE, AMYLASE in the last 168 hours. No results for input(s): AMMONIA in the last 168 hours. CBC: Recent Labs  Lab 10/20/17 1510  10/23/17 1131 10/24/17 0730  WBC 10.4   < > 14.0* 14.5*  NEUTROABS 8.0*  --   --   --   HGB 7.9*   < > 8.9* 9.7*  HCT 25.6*   < > 28.4* 31.7*  MCV 75.7*   < > 76.5* 79.1  PLT 300   < > 257 261   < > = values in this interval not displayed.   Cardiac Enzymes: Recent Labs  Lab 10/21/17 2348  TROPONINI 0.03*   BNP: Invalid input(s): POCBNP CBG: Recent Labs  Lab  10/25/17 0746 10/25/17 1216  GLUCAP 166* 149*      Disposition and Follow-up: Discharge Instructions    Diet - low sodium heart healthy   Complete by:  As directed    Discharge instructions   Complete by:  As directed    PLEASE GO TO [place], Monday Wednesday Friday schedule, second shift . Please arrive at 11:15 AM on Monday.   Increase activity slowly   Complete by:  As directed        DISPOSITION: home    DISCHARGE FOLLOW-UP Follow-up Information    Biagio Borg, MD. Schedule  an appointment as soon as possible for a visit in 2 week(s).   Specialties:  Internal Medicine, Radiology Contact information: [place] [address] [phone]        [facility]. Go to.   Why:  Monday, Wednesday, Friday Contact information: [address], [place], [place] [address]  Phone: [phone]           Time coordinating discharge:  54mins   Signed:   Estill Cotta M.D. [facility] 10/25/2017, 12:43 PM Pager: [phone]

## 2017-10-25 NOTE — Progress Notes (Signed)
Discharge instructions discussed and reviewed with pt, verbalized understanding. Copy of AVS given. Pt was escorted out of the unit in wheelchair, took all belongings with him.

## 2017-10-25 NOTE — Progress Notes (Signed)
Subjective:  HD yest- removed 2 liters-   Objective Vital signs in last 24 hours: Vitals:   10/24/17 1807 10/24/17 2231 10/25/17 0449 10/25/17 1007  BP: 132/84 130/80 (!) 138/94 (!) 148/95  Pulse: 89 85 93 (!) 115  Resp: 18 18 18 18   Temp: 98.4 F (36.9 C) 98 F (36.7 C) 98.3 F (36.8 C) 98 F (36.7 C)  TempSrc: Oral Oral Oral Oral  SpO2: 99% 99% 98% 97%  Weight:  88.2 kg (194 lb 7.1 oz)    Height:       Weight change: -3.2 kg (-0.9 oz)  Intake/Output Summary (Last 24 hours) at 10/25/2017 1107 Last data filed at 10/25/2017 0451 Gross per 24 hour  Intake 600 ml  Output 0 ml  Net 600 ml    Assessment/Plan: 47 year old black male with known advanced chronic kidney disease now with incident of syncope but with GFR discovered to be less than 5 1.Renal- he had not claimed uremic symptoms til now.  s/p PermCath placement with first treatment 3/12, second 3/13 and third 3/14.  CLIP to OP unit- has spot MWF at [facility].  Eventually he does want to do peritoneal dialysis.  I told him the recommendation would be for a backup fistula but he is concerned about how that will affect his work and does not consent to that at this time. I have contacted home training to make contact to expedite home visit and PD cath placement. He is clear from my standpoint for discharge 2. Hypertension/volume  -volume status seems pretty good.  I do think he had a change in blood pressure and that is what brought on his syncope.  I have chosen to discontinue his labetalol and hydralazine and only continue Norvasc.  As dialysis starts I think his blood pressure will improve - establishing EDW  3. Anemia  -advanced and worsening.  This is only contributing to his symptoms.  Hemoglobin was in the eights last week then in the sixes- s/p transfusion 3/11.  Getting IV iron and ESA- seems improved 4.  Bones-he is on calcitriol and PhosLo, those will be continued- stopping his weekly vitamin D    Hector Mahoney  A    Labs: Basic Metabolic Panel: Recent Labs  Lab 10/21/17 2339 10/23/17 1131 10/24/17 0730  NA 135 138 138  K 3.8 3.7 3.9  CL 96* 98* 99*  CO2 21* 25 26  GLUCOSE 208* 121* 111*  BUN 133* 77* 48*  CREATININE 12.90* 9.31* 7.42*  CALCIUM 7.9* 8.4* 8.6*  PHOS 7.8* 6.7* 5.5*   Liver Function Tests: Recent Labs  Lab 10/20/17 1510  10/21/17 2339 10/23/17 1131 10/24/17 0730  AST 45*  --   --   --   --   ALT 37  --   --   --   --   ALKPHOS 107  --   --   --   --   BILITOT 1.1  --   --   --   --   PROT 7.0  --   --   --   --   ALBUMIN 3.2*   < > 3.2* 2.9* 2.9*   < > = values in this interval not displayed.   No results for input(s): LIPASE, AMYLASE in the last 168 hours. No results for input(s): AMMONIA in the last 168 hours. CBC: Recent Labs  Lab 10/20/17 1510 10/21/17 0703 10/21/17 2339 10/23/17 1131 10/24/17 0730  WBC 10.4 8.9 11.0* 14.0* 14.5*  NEUTROABS 8.0*  --   --   --   --  HGB 7.9* 6.7* 9.3* 8.9* 9.7*  HCT 25.6* 22.1* 29.2* 28.4* 31.7*  MCV 75.7* 74.9* 75.6* 76.5* 79.1  PLT 300 267 262 257 261   Cardiac Enzymes: Recent Labs  Lab 10/21/17 2348  TROPONINI 0.03*   CBG: Recent Labs  Lab 10/23/17 2138 10/24/17 1143 10/24/17 1621 10/24/17 2214 10/25/17 0746  GLUCAP 157* 172* 245* 134* 166*    Iron Studies:  No results for input(s): IRON, TIBC, TRANSFERRIN, FERRITIN in the last 72 hours. Studies/Results: No results found. Medications: Infusions: . sodium chloride Stopped (10/21/17 1108)    Scheduled Medications: . amLODipine  5 mg Oral QHS  . calcitRIOL  0.25 mcg Oral Q M,W,F-1800  . calcium acetate  1,334 mg Oral TID WC  . darbepoetin (ARANESP) injection - DIALYSIS  200 mcg Intravenous Q Tue-HD  . docusate sodium  100 mg Oral BID  . heparin injection (subcutaneous)  5,000 Units Subcutaneous Q8H  . insulin aspart  0-5 Units Subcutaneous QHS  . insulin aspart  0-9 Units Subcutaneous TID WC  . insulin aspart  6 Units Subcutaneous TID WC   . insulin glargine  22 Units Subcutaneous QHS  . isosorbide mononitrate  15 mg Oral Daily  . rOPINIRole  0.25 mg Oral QHS  . rosuvastatin  10 mg Oral QHS  . sodium chloride flush  3 mL Intravenous Q12H    have reviewed scheduled and prn medications.  Physical Exam: General: more alert, seen on HD Heart: RRR Lungs: mostly clear Abdomen: soft, non tender Extremities: minimal edema Dialysis Access: new right sided pc placed 3/12    10/25/2017,11:07 AM  LOS: 5 days

## 2017-10-28 ENCOUNTER — Other Ambulatory Visit (HOSPITAL_COMMUNITY): Payer: Self-pay

## 2017-10-28 DIAGNOSIS — I10 Essential (primary) hypertension: Secondary | ICD-10-CM | POA: Diagnosis not present

## 2017-10-28 DIAGNOSIS — N186 End stage renal disease: Secondary | ICD-10-CM | POA: Diagnosis not present

## 2017-10-29 ENCOUNTER — Encounter (HOSPITAL_COMMUNITY): Payer: Federal, State, Local not specified - PPO

## 2017-10-30 DIAGNOSIS — N186 End stage renal disease: Secondary | ICD-10-CM | POA: Diagnosis not present

## 2017-11-01 DIAGNOSIS — N186 End stage renal disease: Secondary | ICD-10-CM | POA: Diagnosis not present

## 2017-11-04 DIAGNOSIS — N186 End stage renal disease: Secondary | ICD-10-CM | POA: Diagnosis not present

## 2017-11-05 ENCOUNTER — Encounter (HOSPITAL_COMMUNITY): Payer: Federal, State, Local not specified - PPO

## 2017-11-05 ENCOUNTER — Encounter: Payer: Self-pay | Admitting: Internal Medicine

## 2017-11-05 ENCOUNTER — Ambulatory Visit: Payer: Federal, State, Local not specified - PPO | Admitting: Internal Medicine

## 2017-11-05 VITALS — BP 124/86 | HR 119 | Temp 98.3°F | Ht 66.0 in | Wt 199.0 lb

## 2017-11-05 DIAGNOSIS — Z794 Long term (current) use of insulin: Secondary | ICD-10-CM

## 2017-11-05 DIAGNOSIS — E1022 Type 1 diabetes mellitus with diabetic chronic kidney disease: Secondary | ICD-10-CM | POA: Diagnosis not present

## 2017-11-05 DIAGNOSIS — Z0000 Encounter for general adult medical examination without abnormal findings: Secondary | ICD-10-CM

## 2017-11-05 DIAGNOSIS — N185 Chronic kidney disease, stage 5: Secondary | ICD-10-CM | POA: Diagnosis not present

## 2017-11-05 DIAGNOSIS — E785 Hyperlipidemia, unspecified: Secondary | ICD-10-CM

## 2017-11-05 DIAGNOSIS — I10 Essential (primary) hypertension: Secondary | ICD-10-CM

## 2017-11-05 DIAGNOSIS — IMO0001 Reserved for inherently not codable concepts without codable children: Secondary | ICD-10-CM

## 2017-11-05 DIAGNOSIS — E118 Type 2 diabetes mellitus with unspecified complications: Secondary | ICD-10-CM

## 2017-11-05 NOTE — Patient Instructions (Signed)
Please continue all other medications as before, and refills have been done if requested.  Please have the pharmacy call with any other refills you may need.  Please continue your efforts at being more active, low cholesterol diet, and weight control.  You are otherwise up to date with prevention measures today.  Please keep your appointments with your specialists as you may have planned  Please return in 6 months, or sooner if needed, with Lab testing done 3-5 days before  

## 2017-11-05 NOTE — Progress Notes (Signed)
Subjective:    Patient ID: Hector Mahoney, male    DOB: 1971/04/26, 47 y.o.   MRN: [id number]  HPI  Here to f/u; overall doing ok,  Pt denies chest pain, increasing sob or doe, wheezing, orthopnea, PND, increased LE swelling, palpitations, dizziness or syncope.  Pt denies new neurological symptoms such as new headache, or facial or extremity weakness or numbness.  Pt denies polydipsia, polyuria, or low sugar episode.  Pt states overall good compliance with meds, mostly trying to follow appropriate diet and states with recent dialysis so far, he has felt the best in years with strength, stamina and overall sense of wellbeing.  Does have some recurrent constipation but trying to work on diet.  No other new complaints or interval hx Past Medical History:  Diagnosis Date  . ABSCESS, TOOTH 04/23/2009  . AKI (acute kidney injury) ([place]) 08/2015  . CHF (congestive heart failure) ([place])   . DIABETES MELLITUS, TYPE I 07/30/2007  . Esophageal reflux 04/27/2009  . FATIGUE 04/27/2009  . HYPERLIPIDEMIA 04/24/2007  . HYPERTENSION 04/24/2007  . INSOMNIA-SLEEP DISORDER-UNSPEC 04/27/2009  . LUMBAR STRAIN, ACUTE 12/11/2008  . RASH-NONVESICULAR 03/11/2008  . Shortness of breath    "lying down and w/exertion right now" (12/22/2012)   Past Surgical History:  Procedure Laterality Date  . IR FLUORO GUIDE CV LINE RIGHT  10/22/2017  . IR US GUIDE VASC ACCESS RIGHT  10/22/2017  . MOUTH SURGERY      reports that he quit smoking about 2 years ago. His smoking use included cigarettes. He has never used smokeless tobacco. He reports that he drinks alcohol. He reports that he does not use drugs. family history includes Diabetes in his maternal aunt and mother; Hypertension in his mother. No Known Allergies Current Outpatient Medications on File Prior to Visit  Medication Sig Dispense Refill  . amLODipine (NORVASC) 5 MG tablet Take 1 tablet (5 mg total) by mouth daily. 30 tablet 1  . aspirin EC 81 MG EC tablet Take 1 tablet  (81 mg total) by mouth daily. 30 tablet 0  . calcitRIOL (ROCALTROL) 0.25 MCG capsule Take 1 capsule (0.25 mcg total) by mouth every Monday, Wednesday, and Friday at 6 PM. 15 capsule 0  . calcium acetate (PHOSLO) 667 MG capsule Take 2 capsules (1,334 mg total) by mouth 3 (three) times daily with meals. 180 capsule 3  . cyclobenzaprine (FLEXERIL) 10 MG tablet Take 1 tablet (10 mg total) by mouth at bedtime as needed for muscle spasms. 90 tablet 01  . ferrous sulfate 325 (65 FE) MG tablet Take 1 tablet (325 mg total) by mouth 2 (two) times daily with a meal. 60 tablet 0  . insulin aspart (NOVOLOG) 100 UNIT/ML injection Inject 8-12 Units into the skin 3 (three) times daily with meals. (Patient taking differently: Inject 8-12 Units into the skin 3 (three) times daily with meals. Blood sugar 120 = 2 units 130 - 10 units Higher than 130 =  12 units) 2 vial 5  . insulin glargine (LANTUS) 100 UNIT/ML injection Inject 0.25 mLs (25 Units total) into the skin at bedtime. 20 mL 5  . isosorbide mononitrate (IMDUR) 30 MG 24 hr tablet TAKE ONE-HALF TABLET BY MOUTH ONCE DAILY (Patient taking differently: TAKE 15 mg  TABLET BY MOUTH ONCE DAILY) 90 tablet 1  . Menthol-Methyl Salicylate (MUSCLE RUB) 10-15 % CREA Apply 1 application topically as needed for muscle pain (for leg cramps). 35 g 1  . polyethylene glycol (MIRALAX / GLYCOLAX) packet Take  17 g by mouth daily as needed for moderate constipation (over the counter). 14 each 0  . rOPINIRole (REQUIP) 0.25 MG tablet TAKE 1 TABLET BY MOUTH AT BEDTIME 90 tablet 3  . rosuvastatin (CRESTOR) 10 MG tablet Take 1 tablet (10 mg total) by mouth daily. 30 tablet 3  . traMADol (ULTRAM) 50 MG tablet Take 1 tablet (50 mg total) by mouth every 8 (eight) hours as needed for moderate pain. 20 tablet 0   No current facility-administered medications on file prior to visit.    Review of Systems  Constitutional: Negative for other unusual diaphoresis or sweats HENT: Negative for ear  discharge or swelling Eyes: Negative for other worsening visual disturbances Respiratory: Negative for stridor or other swelling  Gastrointestinal: Negative for worsening distension or other blood Genitourinary: Negative for retention or other urinary change Musculoskeletal: Negative for other MSK pain or swelling Skin: Negative for color change or other new lesions Neurological: Negative for worsening tremors and other numbness  Psychiatric/Behavioral: Negative for worsening agitation or other fatigue All other system neg per pt    Objective:   Physical Exam BP 124/86   Pulse (!) 119   Temp 98.3 F (36.8 C) (Oral)   Ht 5\' 6"  (1.676 m)   Wt 199 lb (90.3 kg)   SpO2 97%   BMI 32.12 kg/m  VS noted,  Constitutional: Pt appears in NAD HENT: Head: NCAT.  Right Ear: External ear normal.  Left Ear: External ear normal.  Eyes: . Pupils are equal, round, and reactive to light. Conjunctivae and EOM are normal Nose: without d/c or deformity Neck: Neck supple. Gross normal ROM Cardiovascular: Normal rate and regular rhythm.   Pulmonary/Chest: Effort normal and breath sounds without rales or wheezing.  Abd:  Soft, NT, ND, + BS, no organomegaly Neurological: Pt is alert. At baseline orientation, motor grossly intact Skin: Skin is warm. No rashes, other new lesions, no LE edema Psychiatric: Pt behavior is normal without agitation  No other exam findings  Lab Results  Component Value Date   WBC 14.5 (H) 10/24/2017   HGB 9.7 (L) 10/24/2017   HCT 31.7 (L) 10/24/2017   PLT 261 10/24/2017   GLUCOSE 111 (H) 10/24/2017   CHOL 293 (H) 01/02/2013   TRIG 316.0 (H) 01/02/2013   HDL 59.60 01/02/2013   LDLDIRECT 176.4 01/02/2013   ALT 37 10/20/2017   AST 45 (H) 10/20/2017   NA 138 10/24/2017   K 3.9 10/24/2017   CL 99 (L) 10/24/2017   CREATININE 7.42 (H) 10/24/2017   BUN 48 (H) 10/24/2017   CO2 26 10/24/2017   TSH 2.553 12/22/2012   INR 1.05 10/21/2017   HGBA1C 7.3 10/07/2017          Assessment & Plan:

## 2017-11-06 DIAGNOSIS — N186 End stage renal disease: Secondary | ICD-10-CM | POA: Diagnosis not present

## 2017-11-06 NOTE — Assessment & Plan Note (Signed)
stable overall by history and exam, recent data reviewed with pt, and pt to continue medical treatment as before,  to f/u any worsening symptoms or concerns, for f/u lab 

## 2017-11-06 NOTE — Assessment & Plan Note (Signed)
stable overall by history and exam, recent data reviewed with pt, and pt to continue medical treatment as before,  to f/u any worsening symptoms or concerns Lab Results  Component Value Date   HGBA1C 7.3 10/07/2017

## 2017-11-06 NOTE — Assessment & Plan Note (Signed)
BP Readings from Last 3 Encounters:  11/05/17 124/86  10/25/17 (!) 148/95  10/16/17 134/82  stable overall by history and exam, recent data reviewed with pt, and pt to continue medical treatment as before,  to f/u any worsening symptoms or concerns

## 2017-11-06 NOTE — Assessment & Plan Note (Signed)
To cont HD M-W-F, f/u nephrology as planned

## 2017-11-08 DIAGNOSIS — N186 End stage renal disease: Secondary | ICD-10-CM | POA: Diagnosis not present

## 2017-11-10 DIAGNOSIS — E1022 Type 1 diabetes mellitus with diabetic chronic kidney disease: Secondary | ICD-10-CM | POA: Diagnosis not present

## 2017-11-10 DIAGNOSIS — N186 End stage renal disease: Secondary | ICD-10-CM | POA: Diagnosis not present

## 2017-11-10 DIAGNOSIS — Z992 Dependence on renal dialysis: Secondary | ICD-10-CM | POA: Diagnosis not present

## 2017-11-11 DIAGNOSIS — N2581 Secondary hyperparathyroidism of renal origin: Secondary | ICD-10-CM | POA: Diagnosis not present

## 2017-11-11 DIAGNOSIS — N186 End stage renal disease: Secondary | ICD-10-CM | POA: Diagnosis not present

## 2017-11-13 DIAGNOSIS — N2581 Secondary hyperparathyroidism of renal origin: Secondary | ICD-10-CM | POA: Diagnosis not present

## 2017-11-13 DIAGNOSIS — N186 End stage renal disease: Secondary | ICD-10-CM | POA: Diagnosis not present

## 2017-11-15 DIAGNOSIS — N186 End stage renal disease: Secondary | ICD-10-CM | POA: Diagnosis not present

## 2017-11-15 DIAGNOSIS — N2581 Secondary hyperparathyroidism of renal origin: Secondary | ICD-10-CM | POA: Diagnosis not present

## 2017-11-18 DIAGNOSIS — N186 End stage renal disease: Secondary | ICD-10-CM | POA: Diagnosis not present

## 2017-11-18 DIAGNOSIS — N2581 Secondary hyperparathyroidism of renal origin: Secondary | ICD-10-CM | POA: Diagnosis not present

## 2017-11-19 ENCOUNTER — Telehealth: Payer: Self-pay

## 2017-11-19 NOTE — Telephone Encounter (Signed)
Do you recall seeing this? I dont see it in the media tab.  Copied from [place] [phone]. Topic: Inquiry >> Nov 19, 2017 11:27 AM Oliver Pila B wrote: Reason for CRM: pt called to make sure that the "physicians statement form" has been received by fax to the facility, contact pt to advise when ready for pickup or if the form was not received

## 2017-11-19 NOTE — Telephone Encounter (Signed)
I have not received this form.

## 2017-11-19 NOTE — Telephone Encounter (Signed)
Spoke to pt, he stated that it was a mix up, the form was sent to the nephrologist to be filled out since he is on dialyasis. He stated that he can disregard the previous msg. I expressed understanding and told the pt that I will make a note of our conversation.

## 2017-11-20 DIAGNOSIS — N2581 Secondary hyperparathyroidism of renal origin: Secondary | ICD-10-CM | POA: Diagnosis not present

## 2017-11-20 DIAGNOSIS — N186 End stage renal disease: Secondary | ICD-10-CM | POA: Diagnosis not present

## 2017-11-22 DIAGNOSIS — N186 End stage renal disease: Secondary | ICD-10-CM | POA: Diagnosis not present

## 2017-11-22 DIAGNOSIS — N2581 Secondary hyperparathyroidism of renal origin: Secondary | ICD-10-CM | POA: Diagnosis not present

## 2017-11-25 DIAGNOSIS — N2581 Secondary hyperparathyroidism of renal origin: Secondary | ICD-10-CM | POA: Diagnosis not present

## 2017-11-25 DIAGNOSIS — N186 End stage renal disease: Secondary | ICD-10-CM | POA: Diagnosis not present

## 2017-11-25 DIAGNOSIS — N289 Disorder of kidney and ureter, unspecified: Secondary | ICD-10-CM | POA: Diagnosis not present

## 2017-11-25 DIAGNOSIS — Z992 Dependence on renal dialysis: Secondary | ICD-10-CM | POA: Diagnosis not present

## 2017-11-27 DIAGNOSIS — N186 End stage renal disease: Secondary | ICD-10-CM | POA: Diagnosis not present

## 2017-11-27 DIAGNOSIS — N2581 Secondary hyperparathyroidism of renal origin: Secondary | ICD-10-CM | POA: Diagnosis not present

## 2017-11-28 DIAGNOSIS — Z87891 Personal history of nicotine dependence: Secondary | ICD-10-CM | POA: Diagnosis not present

## 2017-11-28 DIAGNOSIS — N186 End stage renal disease: Secondary | ICD-10-CM | POA: Diagnosis not present

## 2017-11-28 DIAGNOSIS — Z794 Long term (current) use of insulin: Secondary | ICD-10-CM | POA: Diagnosis not present

## 2017-11-28 DIAGNOSIS — I120 Hypertensive chronic kidney disease with stage 5 chronic kidney disease or end stage renal disease: Secondary | ICD-10-CM | POA: Diagnosis not present

## 2017-11-28 DIAGNOSIS — Z01818 Encounter for other preprocedural examination: Secondary | ICD-10-CM | POA: Diagnosis not present

## 2017-11-28 DIAGNOSIS — E1022 Type 1 diabetes mellitus with diabetic chronic kidney disease: Secondary | ICD-10-CM | POA: Diagnosis not present

## 2017-11-28 DIAGNOSIS — Z7682 Awaiting organ transplant status: Secondary | ICD-10-CM | POA: Diagnosis not present

## 2017-11-29 DIAGNOSIS — N2581 Secondary hyperparathyroidism of renal origin: Secondary | ICD-10-CM | POA: Diagnosis not present

## 2017-11-29 DIAGNOSIS — N186 End stage renal disease: Secondary | ICD-10-CM | POA: Diagnosis not present

## 2017-12-02 DIAGNOSIS — N2581 Secondary hyperparathyroidism of renal origin: Secondary | ICD-10-CM | POA: Diagnosis not present

## 2017-12-02 DIAGNOSIS — N186 End stage renal disease: Secondary | ICD-10-CM | POA: Diagnosis not present

## 2017-12-04 DIAGNOSIS — N186 End stage renal disease: Secondary | ICD-10-CM | POA: Diagnosis not present

## 2017-12-04 DIAGNOSIS — N2581 Secondary hyperparathyroidism of renal origin: Secondary | ICD-10-CM | POA: Diagnosis not present

## 2017-12-06 DIAGNOSIS — N2581 Secondary hyperparathyroidism of renal origin: Secondary | ICD-10-CM | POA: Diagnosis not present

## 2017-12-06 DIAGNOSIS — N186 End stage renal disease: Secondary | ICD-10-CM | POA: Diagnosis not present

## 2017-12-09 DIAGNOSIS — N2581 Secondary hyperparathyroidism of renal origin: Secondary | ICD-10-CM | POA: Diagnosis not present

## 2017-12-09 DIAGNOSIS — N186 End stage renal disease: Secondary | ICD-10-CM | POA: Diagnosis not present

## 2017-12-10 DIAGNOSIS — N186 End stage renal disease: Secondary | ICD-10-CM | POA: Diagnosis not present

## 2017-12-10 DIAGNOSIS — N189 Chronic kidney disease, unspecified: Secondary | ICD-10-CM | POA: Diagnosis not present

## 2017-12-10 DIAGNOSIS — E1122 Type 2 diabetes mellitus with diabetic chronic kidney disease: Secondary | ICD-10-CM | POA: Diagnosis not present

## 2017-12-10 DIAGNOSIS — E1022 Type 1 diabetes mellitus with diabetic chronic kidney disease: Secondary | ICD-10-CM | POA: Diagnosis not present

## 2017-12-10 DIAGNOSIS — Z4902 Encounter for fitting and adjustment of peritoneal dialysis catheter: Secondary | ICD-10-CM | POA: Diagnosis not present

## 2017-12-10 DIAGNOSIS — I120 Hypertensive chronic kidney disease with stage 5 chronic kidney disease or end stage renal disease: Secondary | ICD-10-CM | POA: Diagnosis not present

## 2017-12-10 DIAGNOSIS — Z794 Long term (current) use of insulin: Secondary | ICD-10-CM | POA: Diagnosis not present

## 2017-12-10 DIAGNOSIS — Z992 Dependence on renal dialysis: Secondary | ICD-10-CM | POA: Diagnosis not present

## 2017-12-10 DIAGNOSIS — E8770 Fluid overload, unspecified: Secondary | ICD-10-CM | POA: Diagnosis not present

## 2017-12-10 DIAGNOSIS — N19 Unspecified kidney failure: Secondary | ICD-10-CM | POA: Diagnosis not present

## 2017-12-11 DIAGNOSIS — I120 Hypertensive chronic kidney disease with stage 5 chronic kidney disease or end stage renal disease: Secondary | ICD-10-CM | POA: Diagnosis not present

## 2017-12-11 DIAGNOSIS — Z992 Dependence on renal dialysis: Secondary | ICD-10-CM | POA: Diagnosis not present

## 2017-12-11 DIAGNOSIS — I10 Essential (primary) hypertension: Secondary | ICD-10-CM | POA: Diagnosis not present

## 2017-12-11 DIAGNOSIS — D631 Anemia in chronic kidney disease: Secondary | ICD-10-CM | POA: Diagnosis not present

## 2017-12-11 DIAGNOSIS — Z794 Long term (current) use of insulin: Secondary | ICD-10-CM | POA: Diagnosis not present

## 2017-12-11 DIAGNOSIS — N186 End stage renal disease: Secondary | ICD-10-CM | POA: Diagnosis not present

## 2017-12-11 DIAGNOSIS — E1122 Type 2 diabetes mellitus with diabetic chronic kidney disease: Secondary | ICD-10-CM | POA: Diagnosis not present

## 2017-12-13 DIAGNOSIS — N186 End stage renal disease: Secondary | ICD-10-CM | POA: Diagnosis not present

## 2017-12-13 DIAGNOSIS — N2581 Secondary hyperparathyroidism of renal origin: Secondary | ICD-10-CM | POA: Diagnosis not present

## 2017-12-16 DIAGNOSIS — N186 End stage renal disease: Secondary | ICD-10-CM | POA: Diagnosis not present

## 2017-12-16 DIAGNOSIS — N2581 Secondary hyperparathyroidism of renal origin: Secondary | ICD-10-CM | POA: Diagnosis not present

## 2017-12-18 DIAGNOSIS — N2581 Secondary hyperparathyroidism of renal origin: Secondary | ICD-10-CM | POA: Diagnosis not present

## 2017-12-18 DIAGNOSIS — N186 End stage renal disease: Secondary | ICD-10-CM | POA: Diagnosis not present

## 2017-12-19 ENCOUNTER — Ambulatory Visit: Payer: Federal, State, Local not specified - PPO | Admitting: Cardiovascular Disease

## 2017-12-20 ENCOUNTER — Encounter: Payer: Self-pay | Admitting: *Deleted

## 2017-12-20 DIAGNOSIS — N186 End stage renal disease: Secondary | ICD-10-CM | POA: Diagnosis not present

## 2017-12-20 DIAGNOSIS — N2581 Secondary hyperparathyroidism of renal origin: Secondary | ICD-10-CM | POA: Diagnosis not present

## 2017-12-23 DIAGNOSIS — N186 End stage renal disease: Secondary | ICD-10-CM | POA: Diagnosis not present

## 2017-12-23 DIAGNOSIS — N2581 Secondary hyperparathyroidism of renal origin: Secondary | ICD-10-CM | POA: Diagnosis not present

## 2017-12-25 DIAGNOSIS — N2581 Secondary hyperparathyroidism of renal origin: Secondary | ICD-10-CM | POA: Diagnosis not present

## 2017-12-25 DIAGNOSIS — N186 End stage renal disease: Secondary | ICD-10-CM | POA: Diagnosis not present

## 2017-12-27 DIAGNOSIS — N2581 Secondary hyperparathyroidism of renal origin: Secondary | ICD-10-CM | POA: Diagnosis not present

## 2017-12-27 DIAGNOSIS — N186 End stage renal disease: Secondary | ICD-10-CM | POA: Diagnosis not present

## 2017-12-30 DIAGNOSIS — N186 End stage renal disease: Secondary | ICD-10-CM | POA: Diagnosis not present

## 2017-12-30 DIAGNOSIS — N2581 Secondary hyperparathyroidism of renal origin: Secondary | ICD-10-CM | POA: Diagnosis not present

## 2018-01-01 DIAGNOSIS — N186 End stage renal disease: Secondary | ICD-10-CM | POA: Diagnosis not present

## 2018-01-01 DIAGNOSIS — N2581 Secondary hyperparathyroidism of renal origin: Secondary | ICD-10-CM | POA: Diagnosis not present

## 2018-01-03 DIAGNOSIS — N186 End stage renal disease: Secondary | ICD-10-CM | POA: Diagnosis not present

## 2018-01-03 DIAGNOSIS — N2581 Secondary hyperparathyroidism of renal origin: Secondary | ICD-10-CM | POA: Diagnosis not present

## 2018-01-06 DIAGNOSIS — N2581 Secondary hyperparathyroidism of renal origin: Secondary | ICD-10-CM | POA: Diagnosis not present

## 2018-01-06 DIAGNOSIS — N186 End stage renal disease: Secondary | ICD-10-CM | POA: Diagnosis not present

## 2018-01-08 DIAGNOSIS — N186 End stage renal disease: Secondary | ICD-10-CM | POA: Diagnosis not present

## 2018-01-08 DIAGNOSIS — N2581 Secondary hyperparathyroidism of renal origin: Secondary | ICD-10-CM | POA: Diagnosis not present

## 2018-01-10 DIAGNOSIS — E1022 Type 1 diabetes mellitus with diabetic chronic kidney disease: Secondary | ICD-10-CM | POA: Diagnosis not present

## 2018-01-10 DIAGNOSIS — Z992 Dependence on renal dialysis: Secondary | ICD-10-CM | POA: Diagnosis not present

## 2018-01-10 DIAGNOSIS — N2581 Secondary hyperparathyroidism of renal origin: Secondary | ICD-10-CM | POA: Diagnosis not present

## 2018-01-10 DIAGNOSIS — N186 End stage renal disease: Secondary | ICD-10-CM | POA: Diagnosis not present

## 2018-01-13 ENCOUNTER — Telehealth: Payer: Self-pay | Admitting: Internal Medicine

## 2018-01-13 DIAGNOSIS — N2581 Secondary hyperparathyroidism of renal origin: Secondary | ICD-10-CM | POA: Diagnosis not present

## 2018-01-13 DIAGNOSIS — Z23 Encounter for immunization: Secondary | ICD-10-CM | POA: Diagnosis not present

## 2018-01-13 DIAGNOSIS — N186 End stage renal disease: Secondary | ICD-10-CM | POA: Diagnosis not present

## 2018-01-13 NOTE — Telephone Encounter (Signed)
Spoke to wife, she will provide them with the fax number to sent the forms over.

## 2018-01-13 NOTE — Telephone Encounter (Signed)
Copied from [place] [phone]. Topic: Quick Communication - See Telephone Encounter >> Jan 13, 2018 11:39 AM Keene Breath wrote: CRM for notification. See Telephone encounter for: 01/13/18.  Spouse call to verify if Hector Mahoney has reached out to doctor in regards to a critical illness claim.  CB [phone].

## 2018-01-13 NOTE — Telephone Encounter (Signed)
Not that I am aware, thanks

## 2018-01-14 DIAGNOSIS — N2581 Secondary hyperparathyroidism of renal origin: Secondary | ICD-10-CM | POA: Diagnosis not present

## 2018-01-14 DIAGNOSIS — N186 End stage renal disease: Secondary | ICD-10-CM | POA: Diagnosis not present

## 2018-01-14 DIAGNOSIS — Z23 Encounter for immunization: Secondary | ICD-10-CM | POA: Diagnosis not present

## 2018-01-15 DIAGNOSIS — Z23 Encounter for immunization: Secondary | ICD-10-CM | POA: Diagnosis not present

## 2018-01-15 DIAGNOSIS — N186 End stage renal disease: Secondary | ICD-10-CM | POA: Diagnosis not present

## 2018-01-15 DIAGNOSIS — N2581 Secondary hyperparathyroidism of renal origin: Secondary | ICD-10-CM | POA: Diagnosis not present

## 2018-01-16 DIAGNOSIS — Z23 Encounter for immunization: Secondary | ICD-10-CM | POA: Diagnosis not present

## 2018-01-16 DIAGNOSIS — N2581 Secondary hyperparathyroidism of renal origin: Secondary | ICD-10-CM | POA: Diagnosis not present

## 2018-01-16 DIAGNOSIS — N186 End stage renal disease: Secondary | ICD-10-CM | POA: Diagnosis not present

## 2018-01-17 DIAGNOSIS — Z23 Encounter for immunization: Secondary | ICD-10-CM | POA: Diagnosis not present

## 2018-01-17 DIAGNOSIS — N2581 Secondary hyperparathyroidism of renal origin: Secondary | ICD-10-CM | POA: Diagnosis not present

## 2018-01-17 DIAGNOSIS — N186 End stage renal disease: Secondary | ICD-10-CM | POA: Diagnosis not present

## 2018-01-20 DIAGNOSIS — Z992 Dependence on renal dialysis: Secondary | ICD-10-CM | POA: Diagnosis not present

## 2018-01-20 DIAGNOSIS — N2581 Secondary hyperparathyroidism of renal origin: Secondary | ICD-10-CM | POA: Diagnosis not present

## 2018-01-20 DIAGNOSIS — N186 End stage renal disease: Secondary | ICD-10-CM | POA: Diagnosis not present

## 2018-01-21 DIAGNOSIS — N186 End stage renal disease: Secondary | ICD-10-CM | POA: Diagnosis not present

## 2018-01-21 DIAGNOSIS — N2581 Secondary hyperparathyroidism of renal origin: Secondary | ICD-10-CM | POA: Diagnosis not present

## 2018-01-22 DIAGNOSIS — N186 End stage renal disease: Secondary | ICD-10-CM | POA: Diagnosis not present

## 2018-01-22 DIAGNOSIS — N2581 Secondary hyperparathyroidism of renal origin: Secondary | ICD-10-CM | POA: Diagnosis not present

## 2018-01-23 DIAGNOSIS — N186 End stage renal disease: Secondary | ICD-10-CM | POA: Diagnosis not present

## 2018-01-23 DIAGNOSIS — N2581 Secondary hyperparathyroidism of renal origin: Secondary | ICD-10-CM | POA: Diagnosis not present

## 2018-01-24 DIAGNOSIS — N186 End stage renal disease: Secondary | ICD-10-CM | POA: Diagnosis not present

## 2018-01-24 DIAGNOSIS — N2581 Secondary hyperparathyroidism of renal origin: Secondary | ICD-10-CM | POA: Diagnosis not present

## 2018-01-25 DIAGNOSIS — N186 End stage renal disease: Secondary | ICD-10-CM | POA: Diagnosis not present

## 2018-01-25 DIAGNOSIS — N2581 Secondary hyperparathyroidism of renal origin: Secondary | ICD-10-CM | POA: Diagnosis not present

## 2018-01-26 DIAGNOSIS — N186 End stage renal disease: Secondary | ICD-10-CM | POA: Diagnosis not present

## 2018-01-26 DIAGNOSIS — N2581 Secondary hyperparathyroidism of renal origin: Secondary | ICD-10-CM | POA: Diagnosis not present

## 2018-01-27 DIAGNOSIS — N186 End stage renal disease: Secondary | ICD-10-CM | POA: Diagnosis not present

## 2018-01-27 DIAGNOSIS — N2581 Secondary hyperparathyroidism of renal origin: Secondary | ICD-10-CM | POA: Diagnosis not present

## 2018-01-28 DIAGNOSIS — N2581 Secondary hyperparathyroidism of renal origin: Secondary | ICD-10-CM | POA: Diagnosis not present

## 2018-01-28 DIAGNOSIS — N186 End stage renal disease: Secondary | ICD-10-CM | POA: Diagnosis not present

## 2018-01-29 DIAGNOSIS — N2581 Secondary hyperparathyroidism of renal origin: Secondary | ICD-10-CM | POA: Diagnosis not present

## 2018-01-29 DIAGNOSIS — N186 End stage renal disease: Secondary | ICD-10-CM | POA: Diagnosis not present

## 2018-01-30 DIAGNOSIS — N2581 Secondary hyperparathyroidism of renal origin: Secondary | ICD-10-CM | POA: Diagnosis not present

## 2018-01-30 DIAGNOSIS — N186 End stage renal disease: Secondary | ICD-10-CM | POA: Diagnosis not present

## 2018-01-31 DIAGNOSIS — N2581 Secondary hyperparathyroidism of renal origin: Secondary | ICD-10-CM | POA: Diagnosis not present

## 2018-01-31 DIAGNOSIS — N186 End stage renal disease: Secondary | ICD-10-CM | POA: Diagnosis not present

## 2018-02-01 DIAGNOSIS — N2581 Secondary hyperparathyroidism of renal origin: Secondary | ICD-10-CM | POA: Diagnosis not present

## 2018-02-01 DIAGNOSIS — N186 End stage renal disease: Secondary | ICD-10-CM | POA: Diagnosis not present

## 2018-02-02 DIAGNOSIS — N186 End stage renal disease: Secondary | ICD-10-CM | POA: Diagnosis not present

## 2018-02-02 DIAGNOSIS — N2581 Secondary hyperparathyroidism of renal origin: Secondary | ICD-10-CM | POA: Diagnosis not present

## 2018-02-03 DIAGNOSIS — N186 End stage renal disease: Secondary | ICD-10-CM | POA: Diagnosis not present

## 2018-02-03 DIAGNOSIS — N2581 Secondary hyperparathyroidism of renal origin: Secondary | ICD-10-CM | POA: Diagnosis not present

## 2018-02-04 DIAGNOSIS — N2581 Secondary hyperparathyroidism of renal origin: Secondary | ICD-10-CM | POA: Diagnosis not present

## 2018-02-04 DIAGNOSIS — E119 Type 2 diabetes mellitus without complications: Secondary | ICD-10-CM | POA: Diagnosis not present

## 2018-02-04 DIAGNOSIS — N186 End stage renal disease: Secondary | ICD-10-CM | POA: Diagnosis not present

## 2018-02-05 ENCOUNTER — Telehealth: Payer: Self-pay | Admitting: *Deleted

## 2018-02-05 DIAGNOSIS — N186 End stage renal disease: Secondary | ICD-10-CM | POA: Diagnosis not present

## 2018-02-05 DIAGNOSIS — N2581 Secondary hyperparathyroidism of renal origin: Secondary | ICD-10-CM | POA: Diagnosis not present

## 2018-02-05 NOTE — Telephone Encounter (Signed)
Called pt to sched his NO SHOW visit with Dr Oval Linsey

## 2018-02-06 DIAGNOSIS — Z01810 Encounter for preprocedural cardiovascular examination: Secondary | ICD-10-CM | POA: Diagnosis not present

## 2018-02-06 DIAGNOSIS — E109 Type 1 diabetes mellitus without complications: Secondary | ICD-10-CM | POA: Diagnosis not present

## 2018-02-06 DIAGNOSIS — N185 Chronic kidney disease, stage 5: Secondary | ICD-10-CM | POA: Diagnosis not present

## 2018-02-06 DIAGNOSIS — Z7682 Awaiting organ transplant status: Secondary | ICD-10-CM | POA: Diagnosis not present

## 2018-02-06 DIAGNOSIS — N186 End stage renal disease: Secondary | ICD-10-CM | POA: Diagnosis not present

## 2018-02-06 DIAGNOSIS — N2581 Secondary hyperparathyroidism of renal origin: Secondary | ICD-10-CM | POA: Diagnosis not present

## 2018-02-06 DIAGNOSIS — I10 Essential (primary) hypertension: Secondary | ICD-10-CM | POA: Diagnosis not present

## 2018-02-06 DIAGNOSIS — Z992 Dependence on renal dialysis: Secondary | ICD-10-CM | POA: Diagnosis not present

## 2018-02-06 DIAGNOSIS — E7800 Pure hypercholesterolemia, unspecified: Secondary | ICD-10-CM | POA: Diagnosis not present

## 2018-02-07 DIAGNOSIS — N2581 Secondary hyperparathyroidism of renal origin: Secondary | ICD-10-CM | POA: Diagnosis not present

## 2018-02-07 DIAGNOSIS — N186 End stage renal disease: Secondary | ICD-10-CM | POA: Diagnosis not present

## 2018-02-08 DIAGNOSIS — I959 Hypotension, unspecified: Secondary | ICD-10-CM | POA: Diagnosis not present

## 2018-02-08 DIAGNOSIS — N186 End stage renal disease: Secondary | ICD-10-CM | POA: Diagnosis not present

## 2018-02-08 DIAGNOSIS — N2581 Secondary hyperparathyroidism of renal origin: Secondary | ICD-10-CM | POA: Diagnosis not present

## 2018-02-09 DIAGNOSIS — N2581 Secondary hyperparathyroidism of renal origin: Secondary | ICD-10-CM | POA: Diagnosis not present

## 2018-02-09 DIAGNOSIS — E1022 Type 1 diabetes mellitus with diabetic chronic kidney disease: Secondary | ICD-10-CM | POA: Diagnosis not present

## 2018-02-09 DIAGNOSIS — Z992 Dependence on renal dialysis: Secondary | ICD-10-CM | POA: Diagnosis not present

## 2018-02-09 DIAGNOSIS — N186 End stage renal disease: Secondary | ICD-10-CM | POA: Diagnosis not present

## 2018-02-10 DIAGNOSIS — N186 End stage renal disease: Secondary | ICD-10-CM | POA: Diagnosis not present

## 2018-02-10 DIAGNOSIS — N2581 Secondary hyperparathyroidism of renal origin: Secondary | ICD-10-CM | POA: Diagnosis not present

## 2018-02-11 DIAGNOSIS — N186 End stage renal disease: Secondary | ICD-10-CM | POA: Diagnosis not present

## 2018-02-11 DIAGNOSIS — N2581 Secondary hyperparathyroidism of renal origin: Secondary | ICD-10-CM | POA: Diagnosis not present

## 2018-02-12 DIAGNOSIS — N2581 Secondary hyperparathyroidism of renal origin: Secondary | ICD-10-CM | POA: Diagnosis not present

## 2018-02-12 DIAGNOSIS — N186 End stage renal disease: Secondary | ICD-10-CM | POA: Diagnosis not present

## 2018-02-13 DIAGNOSIS — N2581 Secondary hyperparathyroidism of renal origin: Secondary | ICD-10-CM | POA: Diagnosis not present

## 2018-02-13 DIAGNOSIS — N186 End stage renal disease: Secondary | ICD-10-CM | POA: Diagnosis not present

## 2018-02-14 DIAGNOSIS — N186 End stage renal disease: Secondary | ICD-10-CM | POA: Diagnosis not present

## 2018-02-14 DIAGNOSIS — N2581 Secondary hyperparathyroidism of renal origin: Secondary | ICD-10-CM | POA: Diagnosis not present

## 2018-02-15 DIAGNOSIS — N186 End stage renal disease: Secondary | ICD-10-CM | POA: Diagnosis not present

## 2018-02-15 DIAGNOSIS — N2581 Secondary hyperparathyroidism of renal origin: Secondary | ICD-10-CM | POA: Diagnosis not present

## 2018-02-16 DIAGNOSIS — Z23 Encounter for immunization: Secondary | ICD-10-CM | POA: Diagnosis not present

## 2018-02-16 DIAGNOSIS — N2581 Secondary hyperparathyroidism of renal origin: Secondary | ICD-10-CM | POA: Diagnosis not present

## 2018-02-16 DIAGNOSIS — N186 End stage renal disease: Secondary | ICD-10-CM | POA: Diagnosis not present

## 2018-02-17 DIAGNOSIS — N2581 Secondary hyperparathyroidism of renal origin: Secondary | ICD-10-CM | POA: Diagnosis not present

## 2018-02-17 DIAGNOSIS — N186 End stage renal disease: Secondary | ICD-10-CM | POA: Diagnosis not present

## 2018-02-17 DIAGNOSIS — Z23 Encounter for immunization: Secondary | ICD-10-CM | POA: Diagnosis not present

## 2018-02-18 DIAGNOSIS — N186 End stage renal disease: Secondary | ICD-10-CM | POA: Diagnosis not present

## 2018-02-18 DIAGNOSIS — Z23 Encounter for immunization: Secondary | ICD-10-CM | POA: Diagnosis not present

## 2018-02-18 DIAGNOSIS — N2581 Secondary hyperparathyroidism of renal origin: Secondary | ICD-10-CM | POA: Diagnosis not present

## 2018-02-19 DIAGNOSIS — Z23 Encounter for immunization: Secondary | ICD-10-CM | POA: Diagnosis not present

## 2018-02-19 DIAGNOSIS — Z452 Encounter for adjustment and management of vascular access device: Secondary | ICD-10-CM | POA: Diagnosis not present

## 2018-02-19 DIAGNOSIS — N186 End stage renal disease: Secondary | ICD-10-CM | POA: Diagnosis not present

## 2018-02-19 DIAGNOSIS — N2581 Secondary hyperparathyroidism of renal origin: Secondary | ICD-10-CM | POA: Diagnosis not present

## 2018-02-20 DIAGNOSIS — N2581 Secondary hyperparathyroidism of renal origin: Secondary | ICD-10-CM | POA: Diagnosis not present

## 2018-02-20 DIAGNOSIS — N186 End stage renal disease: Secondary | ICD-10-CM | POA: Diagnosis not present

## 2018-02-20 DIAGNOSIS — Z23 Encounter for immunization: Secondary | ICD-10-CM | POA: Diagnosis not present

## 2018-02-21 DIAGNOSIS — N2581 Secondary hyperparathyroidism of renal origin: Secondary | ICD-10-CM | POA: Diagnosis not present

## 2018-02-21 DIAGNOSIS — Z23 Encounter for immunization: Secondary | ICD-10-CM | POA: Diagnosis not present

## 2018-02-21 DIAGNOSIS — N186 End stage renal disease: Secondary | ICD-10-CM | POA: Diagnosis not present

## 2018-02-22 DIAGNOSIS — Z23 Encounter for immunization: Secondary | ICD-10-CM | POA: Diagnosis not present

## 2018-02-22 DIAGNOSIS — N186 End stage renal disease: Secondary | ICD-10-CM | POA: Diagnosis not present

## 2018-02-22 DIAGNOSIS — N2581 Secondary hyperparathyroidism of renal origin: Secondary | ICD-10-CM | POA: Diagnosis not present

## 2018-02-23 DIAGNOSIS — N186 End stage renal disease: Secondary | ICD-10-CM | POA: Diagnosis not present

## 2018-02-23 DIAGNOSIS — N2581 Secondary hyperparathyroidism of renal origin: Secondary | ICD-10-CM | POA: Diagnosis not present

## 2018-02-24 DIAGNOSIS — N186 End stage renal disease: Secondary | ICD-10-CM | POA: Diagnosis not present

## 2018-02-24 DIAGNOSIS — N2581 Secondary hyperparathyroidism of renal origin: Secondary | ICD-10-CM | POA: Diagnosis not present

## 2018-02-25 DIAGNOSIS — N2581 Secondary hyperparathyroidism of renal origin: Secondary | ICD-10-CM | POA: Diagnosis not present

## 2018-02-25 DIAGNOSIS — N186 End stage renal disease: Secondary | ICD-10-CM | POA: Diagnosis not present

## 2018-02-26 DIAGNOSIS — N2581 Secondary hyperparathyroidism of renal origin: Secondary | ICD-10-CM | POA: Diagnosis not present

## 2018-02-26 DIAGNOSIS — N186 End stage renal disease: Secondary | ICD-10-CM | POA: Diagnosis not present

## 2018-02-27 DIAGNOSIS — N186 End stage renal disease: Secondary | ICD-10-CM | POA: Diagnosis not present

## 2018-02-27 DIAGNOSIS — N2581 Secondary hyperparathyroidism of renal origin: Secondary | ICD-10-CM | POA: Diagnosis not present

## 2018-02-28 DIAGNOSIS — N186 End stage renal disease: Secondary | ICD-10-CM | POA: Diagnosis not present

## 2018-02-28 DIAGNOSIS — N2581 Secondary hyperparathyroidism of renal origin: Secondary | ICD-10-CM | POA: Diagnosis not present

## 2018-03-01 DIAGNOSIS — N2581 Secondary hyperparathyroidism of renal origin: Secondary | ICD-10-CM | POA: Diagnosis not present

## 2018-03-01 DIAGNOSIS — N186 End stage renal disease: Secondary | ICD-10-CM | POA: Diagnosis not present

## 2018-03-02 DIAGNOSIS — N186 End stage renal disease: Secondary | ICD-10-CM | POA: Diagnosis not present

## 2018-03-02 DIAGNOSIS — N2581 Secondary hyperparathyroidism of renal origin: Secondary | ICD-10-CM | POA: Diagnosis not present

## 2018-03-03 DIAGNOSIS — N186 End stage renal disease: Secondary | ICD-10-CM | POA: Diagnosis not present

## 2018-03-03 DIAGNOSIS — N2581 Secondary hyperparathyroidism of renal origin: Secondary | ICD-10-CM | POA: Diagnosis not present

## 2018-03-04 DIAGNOSIS — N2581 Secondary hyperparathyroidism of renal origin: Secondary | ICD-10-CM | POA: Diagnosis not present

## 2018-03-04 DIAGNOSIS — N186 End stage renal disease: Secondary | ICD-10-CM | POA: Diagnosis not present

## 2018-03-05 DIAGNOSIS — N186 End stage renal disease: Secondary | ICD-10-CM | POA: Diagnosis not present

## 2018-03-05 DIAGNOSIS — N2581 Secondary hyperparathyroidism of renal origin: Secondary | ICD-10-CM | POA: Diagnosis not present

## 2018-03-06 DIAGNOSIS — N2581 Secondary hyperparathyroidism of renal origin: Secondary | ICD-10-CM | POA: Diagnosis not present

## 2018-03-06 DIAGNOSIS — N186 End stage renal disease: Secondary | ICD-10-CM | POA: Diagnosis not present

## 2018-03-07 DIAGNOSIS — N186 End stage renal disease: Secondary | ICD-10-CM | POA: Diagnosis not present

## 2018-03-07 DIAGNOSIS — N2581 Secondary hyperparathyroidism of renal origin: Secondary | ICD-10-CM | POA: Diagnosis not present

## 2018-03-08 DIAGNOSIS — N2581 Secondary hyperparathyroidism of renal origin: Secondary | ICD-10-CM | POA: Diagnosis not present

## 2018-03-08 DIAGNOSIS — N186 End stage renal disease: Secondary | ICD-10-CM | POA: Diagnosis not present

## 2018-03-09 DIAGNOSIS — N186 End stage renal disease: Secondary | ICD-10-CM | POA: Diagnosis not present

## 2018-03-09 DIAGNOSIS — N2581 Secondary hyperparathyroidism of renal origin: Secondary | ICD-10-CM | POA: Diagnosis not present

## 2018-03-10 DIAGNOSIS — N2581 Secondary hyperparathyroidism of renal origin: Secondary | ICD-10-CM | POA: Diagnosis not present

## 2018-03-10 DIAGNOSIS — N186 End stage renal disease: Secondary | ICD-10-CM | POA: Diagnosis not present

## 2018-03-11 DIAGNOSIS — N186 End stage renal disease: Secondary | ICD-10-CM | POA: Diagnosis not present

## 2018-03-11 DIAGNOSIS — N2581 Secondary hyperparathyroidism of renal origin: Secondary | ICD-10-CM | POA: Diagnosis not present

## 2018-03-12 DIAGNOSIS — N2581 Secondary hyperparathyroidism of renal origin: Secondary | ICD-10-CM | POA: Diagnosis not present

## 2018-03-12 DIAGNOSIS — N186 End stage renal disease: Secondary | ICD-10-CM | POA: Diagnosis not present

## 2018-03-12 DIAGNOSIS — E1022 Type 1 diabetes mellitus with diabetic chronic kidney disease: Secondary | ICD-10-CM | POA: Diagnosis not present

## 2018-03-12 DIAGNOSIS — Z992 Dependence on renal dialysis: Secondary | ICD-10-CM | POA: Diagnosis not present

## 2018-03-13 DIAGNOSIS — N186 End stage renal disease: Secondary | ICD-10-CM | POA: Diagnosis not present

## 2018-03-13 DIAGNOSIS — N2581 Secondary hyperparathyroidism of renal origin: Secondary | ICD-10-CM | POA: Diagnosis not present

## 2018-03-14 DIAGNOSIS — N2581 Secondary hyperparathyroidism of renal origin: Secondary | ICD-10-CM | POA: Diagnosis not present

## 2018-03-14 DIAGNOSIS — N186 End stage renal disease: Secondary | ICD-10-CM | POA: Diagnosis not present

## 2018-03-15 DIAGNOSIS — N2581 Secondary hyperparathyroidism of renal origin: Secondary | ICD-10-CM | POA: Diagnosis not present

## 2018-03-15 DIAGNOSIS — N186 End stage renal disease: Secondary | ICD-10-CM | POA: Diagnosis not present

## 2018-03-16 DIAGNOSIS — N2581 Secondary hyperparathyroidism of renal origin: Secondary | ICD-10-CM | POA: Diagnosis not present

## 2018-03-16 DIAGNOSIS — N186 End stage renal disease: Secondary | ICD-10-CM | POA: Diagnosis not present

## 2018-03-17 DIAGNOSIS — N186 End stage renal disease: Secondary | ICD-10-CM | POA: Diagnosis not present

## 2018-03-17 DIAGNOSIS — N2581 Secondary hyperparathyroidism of renal origin: Secondary | ICD-10-CM | POA: Diagnosis not present

## 2018-03-18 DIAGNOSIS — N2581 Secondary hyperparathyroidism of renal origin: Secondary | ICD-10-CM | POA: Diagnosis not present

## 2018-03-18 DIAGNOSIS — N186 End stage renal disease: Secondary | ICD-10-CM | POA: Diagnosis not present

## 2018-03-18 DIAGNOSIS — Z7682 Awaiting organ transplant status: Secondary | ICD-10-CM | POA: Diagnosis not present

## 2018-03-19 DIAGNOSIS — N186 End stage renal disease: Secondary | ICD-10-CM | POA: Diagnosis not present

## 2018-03-19 DIAGNOSIS — N2581 Secondary hyperparathyroidism of renal origin: Secondary | ICD-10-CM | POA: Diagnosis not present

## 2018-03-20 DIAGNOSIS — N2581 Secondary hyperparathyroidism of renal origin: Secondary | ICD-10-CM | POA: Diagnosis not present

## 2018-03-20 DIAGNOSIS — N186 End stage renal disease: Secondary | ICD-10-CM | POA: Diagnosis not present

## 2018-03-21 DIAGNOSIS — N2581 Secondary hyperparathyroidism of renal origin: Secondary | ICD-10-CM | POA: Diagnosis not present

## 2018-03-21 DIAGNOSIS — N186 End stage renal disease: Secondary | ICD-10-CM | POA: Diagnosis not present

## 2018-03-22 DIAGNOSIS — N186 End stage renal disease: Secondary | ICD-10-CM | POA: Diagnosis not present

## 2018-03-22 DIAGNOSIS — N2581 Secondary hyperparathyroidism of renal origin: Secondary | ICD-10-CM | POA: Diagnosis not present

## 2018-03-23 DIAGNOSIS — N186 End stage renal disease: Secondary | ICD-10-CM | POA: Diagnosis not present

## 2018-03-23 DIAGNOSIS — N2581 Secondary hyperparathyroidism of renal origin: Secondary | ICD-10-CM | POA: Diagnosis not present

## 2018-03-24 DIAGNOSIS — N2581 Secondary hyperparathyroidism of renal origin: Secondary | ICD-10-CM | POA: Diagnosis not present

## 2018-03-24 DIAGNOSIS — N186 End stage renal disease: Secondary | ICD-10-CM | POA: Diagnosis not present

## 2018-03-25 DIAGNOSIS — N186 End stage renal disease: Secondary | ICD-10-CM | POA: Diagnosis not present

## 2018-03-25 DIAGNOSIS — N2581 Secondary hyperparathyroidism of renal origin: Secondary | ICD-10-CM | POA: Diagnosis not present

## 2018-03-26 ENCOUNTER — Ambulatory Visit: Payer: Federal, State, Local not specified - PPO | Admitting: Internal Medicine

## 2018-03-26 ENCOUNTER — Encounter: Payer: Self-pay | Admitting: Internal Medicine

## 2018-03-26 VITALS — BP 138/86 | HR 100 | Temp 98.5°F | Ht 66.0 in | Wt 210.0 lb

## 2018-03-26 DIAGNOSIS — R109 Unspecified abdominal pain: Secondary | ICD-10-CM | POA: Insufficient documentation

## 2018-03-26 DIAGNOSIS — N186 End stage renal disease: Secondary | ICD-10-CM | POA: Diagnosis not present

## 2018-03-26 DIAGNOSIS — R1084 Generalized abdominal pain: Secondary | ICD-10-CM

## 2018-03-26 DIAGNOSIS — R197 Diarrhea, unspecified: Secondary | ICD-10-CM | POA: Diagnosis not present

## 2018-03-26 DIAGNOSIS — R110 Nausea: Secondary | ICD-10-CM | POA: Diagnosis not present

## 2018-03-26 DIAGNOSIS — N2581 Secondary hyperparathyroidism of renal origin: Secondary | ICD-10-CM | POA: Diagnosis not present

## 2018-03-26 NOTE — Assessment & Plan Note (Signed)
Etiology unclear, for GI panel, and stool cx, tx pending results, gave work note today

## 2018-03-26 NOTE — Assessment & Plan Note (Signed)
Declines antiemetic for now

## 2018-03-26 NOTE — Patient Instructions (Signed)
Please continue all other medications as before, and refills have been done if requested.  Please have the pharmacy call with any other refills you may need.  Please keep your appointments with your specialists as you may have planned  Please go to the LAB in the Basement (turn left off the elevator) for the tests to be done today  You will be contacted by phone if any changes need to be made immediately.  Otherwise, you will receive a letter about your results with an explanation, but please check with MyChart first.  Please remember to sign up for MyChart if you have not done so, as this will be important to you in the future with finding out test results, communicating by private email, and scheduling acute appointments online when needed.  You are given the work note for the rest of the week

## 2018-03-26 NOTE — Progress Notes (Signed)
Subjective:    Patient ID: Hector Mahoney, male    DOB: [birth year], 47 y.o.   MRN: [id number]  HPI  Here after awoke this am , feels nausea without vomiting and gradually increasing dull allo over abd pain, assoc with unusual loose stool several times already today; feels like constant aching, no radation, no fever, but just overall feeling back.  No blood, chills.  Several coworkers have been ill in the past wk.  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  No unusual or bad food recently. Pt denies chest pain, increased sob or doe, wheezing, orthopnea, PND, increased LE swelling, palpitations, dizziness or syncope.  . Pt denies polydipsia, polyuria.  He does not think peritonitis as he has been very careful with PD and using gent wipes at the PD site. Past Medical History:  Diagnosis Date  . ABSCESS, TOOTH 04/23/2009  . AKI (acute kidney injury) ([place]) 08/2015  . CHF (congestive heart failure) ([place])   . DIABETES MELLITUS, TYPE I 07/30/2007  . Esophageal reflux 04/27/2009  . FATIGUE 04/27/2009  . HYPERLIPIDEMIA 04/24/2007  . HYPERTENSION 04/24/2007  . INSOMNIA-SLEEP DISORDER-UNSPEC 04/27/2009  . LUMBAR STRAIN, ACUTE 12/11/2008  . RASH-NONVESICULAR 03/11/2008  . Shortness of breath    "lying down and w/exertion right now" (12/22/2012)   Past Surgical History:  Procedure Laterality Date  . IR FLUORO GUIDE CV LINE RIGHT  10/22/2017  . IR US GUIDE VASC ACCESS RIGHT  10/22/2017  . MOUTH SURGERY      reports that he quit smoking about 2 years ago. His smoking use included cigarettes. He has never used smokeless tobacco. He reports that he drinks alcohol. He reports that he does not use drugs. family history includes Diabetes in his maternal aunt and mother; Hypertension in his mother. No Known Allergies Current Outpatient Medications on File Prior to Visit  Medication Sig Dispense Refill  . amLODipine (NORVASC) 5 MG tablet Take 1 tablet (5 mg total) by mouth daily. 30 tablet 1  .  aspirin EC 81 MG EC tablet Take 1 tablet (81 mg total) by mouth daily. 30 tablet 0  . calcitRIOL (ROCALTROL) 0.25 MCG capsule Take 1 capsule (0.25 mcg total) by mouth every Monday, Wednesday, and Friday at 6 PM. 15 capsule 0  . calcium acetate (PHOSLO) 667 MG capsule Take 2 capsules (1,334 mg total) by mouth 3 (three) times daily with meals. 180 capsule 3  . cyclobenzaprine (FLEXERIL) 10 MG tablet Take 1 tablet (10 mg total) by mouth at bedtime as needed for muscle spasms. 90 tablet 01  . ferrous sulfate 325 (65 FE) MG tablet Take 1 tablet (325 mg total) by mouth 2 (two) times daily with a meal. 60 tablet 0  . insulin aspart (NOVOLOG) 100 UNIT/ML injection Inject 8-12 Units into the skin 3 (three) times daily with meals. (Patient taking differently: Inject 8-12 Units into the skin 3 (three) times daily with meals. Blood sugar 120 = 2 units 130 - 10 units Higher than 130 =  12 units) 2 vial 5  . insulin glargine (LANTUS) 100 UNIT/ML injection Inject 0.25 mLs (25 Units total) into the skin at bedtime. 20 mL 5  . isosorbide mononitrate (IMDUR) 30 MG 24 hr tablet TAKE ONE-HALF TABLET BY MOUTH ONCE DAILY (Patient taking differently: TAKE 15 mg  TABLET BY MOUTH ONCE DAILY) 90 tablet 1  . Menthol-Methyl Salicylate (MUSCLE RUB) 10-15 % CREA Apply 1 application topically as needed for muscle pain (for leg cramps).  35 g 1  . polyethylene glycol (MIRALAX / GLYCOLAX) packet Take 17 g by mouth daily as needed for moderate constipation (over the counter). 14 each 0  . rOPINIRole (REQUIP) 0.25 MG tablet TAKE 1 TABLET BY MOUTH AT BEDTIME 90 tablet 3  . rosuvastatin (CRESTOR) 10 MG tablet Take 1 tablet (10 mg total) by mouth daily. 30 tablet 3  . traMADol (ULTRAM) 50 MG tablet Take 1 tablet (50 mg total) by mouth every 8 (eight) hours as needed for moderate pain. 20 tablet 0   No current facility-administered medications on file prior to visit.    Review of Systems  Constitutional: Negative for other unusual  diaphoresis or sweats HENT: Negative for ear discharge or swelling Eyes: Negative for other worsening visual disturbances Respiratory: Negative for stridor or other swelling  Gastrointestinal: Negative for worsening distension or other blood Genitourinary: Negative for retention or other urinary change Musculoskeletal: Negative for other MSK pain or swelling Skin: Negative for color change or other new lesions Neurological: Negative for worsening tremors and other numbness  Psychiatric/Behavioral: Negative for worsening agitation or other fatigue All other system neg per pt    Objective:   Physical Exam BP 138/86   Pulse 100   Temp 98.5 F (36.9 C) (Oral)   Ht 5\' 6"  (1.676 m)   Wt 210 lb (95.3 kg)   SpO2 98%   BMI 33.89 kg/m  VS noted, mild ill Constitutional: Pt appears in NAD HENT: Head: NCAT.  Right Ear: External ear normal.  Left Ear: External ear normal.  Bilat tm's with mild erythema.  Max sinus areas non tender.  Pharynx with mild erythema, no exudate Eyes: . Pupils are equal, round, and reactive to light. Conjunctivae and EOM are normal Nose: without d/c or deformity Neck: Neck supple. Gross normal ROM Cardiovascular: Normal rate and regular rhythm.   Pulmonary/Chest: Effort normal and breath sounds without rales or wheezing.  Abd:  Soft, diffuse mild generalized tender more midline above the umbilicus, ND, + BS, no organomegaly, no gaurding or rebound Neurological: Pt is alert. At baseline orientation, motor grossly intact Skin: Skin is warm. No rashes, other new lesions, no LE edema Psychiatric: Pt behavior is normal without agitation  No other exam findings Lab Results  Component Value Date   WBC 14.5 (H) 10/24/2017   HGB 9.7 (L) 10/24/2017   HCT 31.7 (L) 10/24/2017   PLT 261 10/24/2017   GLUCOSE 111 (H) 10/24/2017   CHOL 293 (H) 01/02/2013   TRIG 316.0 (H) 01/02/2013   HDL 59.60 01/02/2013   LDLDIRECT 176.4 01/02/2013   ALT 37 10/20/2017   AST 45 (H)  10/20/2017   NA 138 10/24/2017   K 3.9 10/24/2017   CL 99 (L) 10/24/2017   CREATININE 7.42 (H) 10/24/2017   BUN 48 (H) 10/24/2017   CO2 26 10/24/2017   TSH 2.553 12/22/2012   INR 1.05 10/21/2017   HGBA1C 7.3 10/07/2017       Assessment & Plan:

## 2018-03-26 NOTE — Assessment & Plan Note (Signed)
Etiology unclear, for labs as ordered,  to f/u any worsening symptoms or concerns

## 2018-03-27 DIAGNOSIS — N186 End stage renal disease: Secondary | ICD-10-CM | POA: Diagnosis not present

## 2018-03-27 DIAGNOSIS — N2581 Secondary hyperparathyroidism of renal origin: Secondary | ICD-10-CM | POA: Diagnosis not present

## 2018-03-28 DIAGNOSIS — N2581 Secondary hyperparathyroidism of renal origin: Secondary | ICD-10-CM | POA: Diagnosis not present

## 2018-03-28 DIAGNOSIS — N186 End stage renal disease: Secondary | ICD-10-CM | POA: Diagnosis not present

## 2018-03-29 DIAGNOSIS — N2581 Secondary hyperparathyroidism of renal origin: Secondary | ICD-10-CM | POA: Diagnosis not present

## 2018-03-29 DIAGNOSIS — N186 End stage renal disease: Secondary | ICD-10-CM | POA: Diagnosis not present

## 2018-03-30 DIAGNOSIS — N2581 Secondary hyperparathyroidism of renal origin: Secondary | ICD-10-CM | POA: Diagnosis not present

## 2018-03-30 DIAGNOSIS — N186 End stage renal disease: Secondary | ICD-10-CM | POA: Diagnosis not present

## 2018-03-31 DIAGNOSIS — N186 End stage renal disease: Secondary | ICD-10-CM | POA: Diagnosis not present

## 2018-03-31 DIAGNOSIS — N2581 Secondary hyperparathyroidism of renal origin: Secondary | ICD-10-CM | POA: Diagnosis not present

## 2018-04-01 DIAGNOSIS — N186 End stage renal disease: Secondary | ICD-10-CM | POA: Diagnosis not present

## 2018-04-01 DIAGNOSIS — N2581 Secondary hyperparathyroidism of renal origin: Secondary | ICD-10-CM | POA: Diagnosis not present

## 2018-04-02 DIAGNOSIS — N186 End stage renal disease: Secondary | ICD-10-CM | POA: Diagnosis not present

## 2018-04-02 DIAGNOSIS — N2581 Secondary hyperparathyroidism of renal origin: Secondary | ICD-10-CM | POA: Diagnosis not present

## 2018-04-03 DIAGNOSIS — N186 End stage renal disease: Secondary | ICD-10-CM | POA: Diagnosis not present

## 2018-04-03 DIAGNOSIS — N2581 Secondary hyperparathyroidism of renal origin: Secondary | ICD-10-CM | POA: Diagnosis not present

## 2018-04-04 DIAGNOSIS — N2581 Secondary hyperparathyroidism of renal origin: Secondary | ICD-10-CM | POA: Diagnosis not present

## 2018-04-04 DIAGNOSIS — N186 End stage renal disease: Secondary | ICD-10-CM | POA: Diagnosis not present

## 2018-04-05 DIAGNOSIS — N2581 Secondary hyperparathyroidism of renal origin: Secondary | ICD-10-CM | POA: Diagnosis not present

## 2018-04-05 DIAGNOSIS — N186 End stage renal disease: Secondary | ICD-10-CM | POA: Diagnosis not present

## 2018-04-06 DIAGNOSIS — N2581 Secondary hyperparathyroidism of renal origin: Secondary | ICD-10-CM | POA: Diagnosis not present

## 2018-04-06 DIAGNOSIS — N186 End stage renal disease: Secondary | ICD-10-CM | POA: Diagnosis not present

## 2018-04-07 ENCOUNTER — Ambulatory Visit: Payer: Federal, State, Local not specified - PPO | Admitting: Internal Medicine

## 2018-04-07 DIAGNOSIS — Z0289 Encounter for other administrative examinations: Secondary | ICD-10-CM

## 2018-04-07 DIAGNOSIS — N186 End stage renal disease: Secondary | ICD-10-CM | POA: Diagnosis not present

## 2018-04-07 DIAGNOSIS — N2581 Secondary hyperparathyroidism of renal origin: Secondary | ICD-10-CM | POA: Diagnosis not present

## 2018-04-08 DIAGNOSIS — N2581 Secondary hyperparathyroidism of renal origin: Secondary | ICD-10-CM | POA: Diagnosis not present

## 2018-04-08 DIAGNOSIS — N186 End stage renal disease: Secondary | ICD-10-CM | POA: Diagnosis not present

## 2018-04-09 DIAGNOSIS — N2581 Secondary hyperparathyroidism of renal origin: Secondary | ICD-10-CM | POA: Diagnosis not present

## 2018-04-09 DIAGNOSIS — N186 End stage renal disease: Secondary | ICD-10-CM | POA: Diagnosis not present

## 2018-04-10 DIAGNOSIS — N186 End stage renal disease: Secondary | ICD-10-CM | POA: Diagnosis not present

## 2018-04-10 DIAGNOSIS — N2581 Secondary hyperparathyroidism of renal origin: Secondary | ICD-10-CM | POA: Diagnosis not present

## 2018-04-11 DIAGNOSIS — N186 End stage renal disease: Secondary | ICD-10-CM | POA: Diagnosis not present

## 2018-04-11 DIAGNOSIS — N2581 Secondary hyperparathyroidism of renal origin: Secondary | ICD-10-CM | POA: Diagnosis not present

## 2018-04-12 DIAGNOSIS — N186 End stage renal disease: Secondary | ICD-10-CM | POA: Diagnosis not present

## 2018-04-12 DIAGNOSIS — Z992 Dependence on renal dialysis: Secondary | ICD-10-CM | POA: Diagnosis not present

## 2018-04-12 DIAGNOSIS — N2581 Secondary hyperparathyroidism of renal origin: Secondary | ICD-10-CM | POA: Diagnosis not present

## 2018-04-12 DIAGNOSIS — E1022 Type 1 diabetes mellitus with diabetic chronic kidney disease: Secondary | ICD-10-CM | POA: Diagnosis not present

## 2018-04-13 DIAGNOSIS — N186 End stage renal disease: Secondary | ICD-10-CM | POA: Diagnosis not present

## 2018-04-13 DIAGNOSIS — N2581 Secondary hyperparathyroidism of renal origin: Secondary | ICD-10-CM | POA: Diagnosis not present

## 2018-04-14 DIAGNOSIS — N186 End stage renal disease: Secondary | ICD-10-CM | POA: Diagnosis not present

## 2018-04-14 DIAGNOSIS — N2581 Secondary hyperparathyroidism of renal origin: Secondary | ICD-10-CM | POA: Diagnosis not present

## 2018-04-15 DIAGNOSIS — N186 End stage renal disease: Secondary | ICD-10-CM | POA: Diagnosis not present

## 2018-04-15 DIAGNOSIS — N2581 Secondary hyperparathyroidism of renal origin: Secondary | ICD-10-CM | POA: Diagnosis not present

## 2018-04-16 DIAGNOSIS — N186 End stage renal disease: Secondary | ICD-10-CM | POA: Diagnosis not present

## 2018-04-16 DIAGNOSIS — N2581 Secondary hyperparathyroidism of renal origin: Secondary | ICD-10-CM | POA: Diagnosis not present

## 2018-04-17 DIAGNOSIS — N2581 Secondary hyperparathyroidism of renal origin: Secondary | ICD-10-CM | POA: Diagnosis not present

## 2018-04-17 DIAGNOSIS — N186 End stage renal disease: Secondary | ICD-10-CM | POA: Diagnosis not present

## 2018-04-18 DIAGNOSIS — N2581 Secondary hyperparathyroidism of renal origin: Secondary | ICD-10-CM | POA: Diagnosis not present

## 2018-04-18 DIAGNOSIS — N186 End stage renal disease: Secondary | ICD-10-CM | POA: Diagnosis not present

## 2018-04-19 DIAGNOSIS — N186 End stage renal disease: Secondary | ICD-10-CM | POA: Diagnosis not present

## 2018-04-19 DIAGNOSIS — N2581 Secondary hyperparathyroidism of renal origin: Secondary | ICD-10-CM | POA: Diagnosis not present

## 2018-04-20 DIAGNOSIS — N2581 Secondary hyperparathyroidism of renal origin: Secondary | ICD-10-CM | POA: Diagnosis not present

## 2018-04-20 DIAGNOSIS — N186 End stage renal disease: Secondary | ICD-10-CM | POA: Diagnosis not present

## 2018-04-21 DIAGNOSIS — N186 End stage renal disease: Secondary | ICD-10-CM | POA: Diagnosis not present

## 2018-04-21 DIAGNOSIS — N2581 Secondary hyperparathyroidism of renal origin: Secondary | ICD-10-CM | POA: Diagnosis not present

## 2018-04-22 DIAGNOSIS — N2581 Secondary hyperparathyroidism of renal origin: Secondary | ICD-10-CM | POA: Diagnosis not present

## 2018-04-22 DIAGNOSIS — N186 End stage renal disease: Secondary | ICD-10-CM | POA: Diagnosis not present

## 2018-04-23 DIAGNOSIS — N186 End stage renal disease: Secondary | ICD-10-CM | POA: Diagnosis not present

## 2018-04-23 DIAGNOSIS — N2581 Secondary hyperparathyroidism of renal origin: Secondary | ICD-10-CM | POA: Diagnosis not present

## 2018-04-24 DIAGNOSIS — N2581 Secondary hyperparathyroidism of renal origin: Secondary | ICD-10-CM | POA: Diagnosis not present

## 2018-04-24 DIAGNOSIS — N186 End stage renal disease: Secondary | ICD-10-CM | POA: Diagnosis not present

## 2018-04-25 DIAGNOSIS — N2581 Secondary hyperparathyroidism of renal origin: Secondary | ICD-10-CM | POA: Diagnosis not present

## 2018-04-25 DIAGNOSIS — N186 End stage renal disease: Secondary | ICD-10-CM | POA: Diagnosis not present

## 2018-04-26 DIAGNOSIS — N2581 Secondary hyperparathyroidism of renal origin: Secondary | ICD-10-CM | POA: Diagnosis not present

## 2018-04-26 DIAGNOSIS — N186 End stage renal disease: Secondary | ICD-10-CM | POA: Diagnosis not present

## 2018-04-27 DIAGNOSIS — N186 End stage renal disease: Secondary | ICD-10-CM | POA: Diagnosis not present

## 2018-04-27 DIAGNOSIS — N2581 Secondary hyperparathyroidism of renal origin: Secondary | ICD-10-CM | POA: Diagnosis not present

## 2018-04-28 DIAGNOSIS — N2581 Secondary hyperparathyroidism of renal origin: Secondary | ICD-10-CM | POA: Diagnosis not present

## 2018-04-28 DIAGNOSIS — N186 End stage renal disease: Secondary | ICD-10-CM | POA: Diagnosis not present

## 2018-04-29 DIAGNOSIS — N2581 Secondary hyperparathyroidism of renal origin: Secondary | ICD-10-CM | POA: Diagnosis not present

## 2018-04-29 DIAGNOSIS — N186 End stage renal disease: Secondary | ICD-10-CM | POA: Diagnosis not present

## 2018-04-30 DIAGNOSIS — N186 End stage renal disease: Secondary | ICD-10-CM | POA: Diagnosis not present

## 2018-04-30 DIAGNOSIS — N2581 Secondary hyperparathyroidism of renal origin: Secondary | ICD-10-CM | POA: Diagnosis not present

## 2018-05-01 DIAGNOSIS — N2581 Secondary hyperparathyroidism of renal origin: Secondary | ICD-10-CM | POA: Diagnosis not present

## 2018-05-01 DIAGNOSIS — N186 End stage renal disease: Secondary | ICD-10-CM | POA: Diagnosis not present

## 2018-05-02 DIAGNOSIS — N186 End stage renal disease: Secondary | ICD-10-CM | POA: Diagnosis not present

## 2018-05-02 DIAGNOSIS — N2581 Secondary hyperparathyroidism of renal origin: Secondary | ICD-10-CM | POA: Diagnosis not present

## 2018-05-03 DIAGNOSIS — N2581 Secondary hyperparathyroidism of renal origin: Secondary | ICD-10-CM | POA: Diagnosis not present

## 2018-05-03 DIAGNOSIS — N186 End stage renal disease: Secondary | ICD-10-CM | POA: Diagnosis not present

## 2018-05-04 DIAGNOSIS — N2581 Secondary hyperparathyroidism of renal origin: Secondary | ICD-10-CM | POA: Diagnosis not present

## 2018-05-04 DIAGNOSIS — N186 End stage renal disease: Secondary | ICD-10-CM | POA: Diagnosis not present

## 2018-05-05 DIAGNOSIS — N186 End stage renal disease: Secondary | ICD-10-CM | POA: Diagnosis not present

## 2018-05-05 DIAGNOSIS — N2581 Secondary hyperparathyroidism of renal origin: Secondary | ICD-10-CM | POA: Diagnosis not present

## 2018-05-06 DIAGNOSIS — N2581 Secondary hyperparathyroidism of renal origin: Secondary | ICD-10-CM | POA: Diagnosis not present

## 2018-05-06 DIAGNOSIS — N186 End stage renal disease: Secondary | ICD-10-CM | POA: Diagnosis not present

## 2018-05-07 DIAGNOSIS — N2581 Secondary hyperparathyroidism of renal origin: Secondary | ICD-10-CM | POA: Diagnosis not present

## 2018-05-07 DIAGNOSIS — N186 End stage renal disease: Secondary | ICD-10-CM | POA: Diagnosis not present

## 2018-05-08 DIAGNOSIS — N2581 Secondary hyperparathyroidism of renal origin: Secondary | ICD-10-CM | POA: Diagnosis not present

## 2018-05-08 DIAGNOSIS — N186 End stage renal disease: Secondary | ICD-10-CM | POA: Diagnosis not present

## 2018-05-09 DIAGNOSIS — N186 End stage renal disease: Secondary | ICD-10-CM | POA: Diagnosis not present

## 2018-05-09 DIAGNOSIS — N2581 Secondary hyperparathyroidism of renal origin: Secondary | ICD-10-CM | POA: Diagnosis not present

## 2018-05-10 DIAGNOSIS — N186 End stage renal disease: Secondary | ICD-10-CM | POA: Diagnosis not present

## 2018-05-10 DIAGNOSIS — N2581 Secondary hyperparathyroidism of renal origin: Secondary | ICD-10-CM | POA: Diagnosis not present

## 2018-05-11 DIAGNOSIS — N186 End stage renal disease: Secondary | ICD-10-CM | POA: Diagnosis not present

## 2018-05-11 DIAGNOSIS — N2581 Secondary hyperparathyroidism of renal origin: Secondary | ICD-10-CM | POA: Diagnosis not present

## 2018-05-12 DIAGNOSIS — E1022 Type 1 diabetes mellitus with diabetic chronic kidney disease: Secondary | ICD-10-CM | POA: Diagnosis not present

## 2018-05-12 DIAGNOSIS — Z992 Dependence on renal dialysis: Secondary | ICD-10-CM | POA: Diagnosis not present

## 2018-05-12 DIAGNOSIS — N186 End stage renal disease: Secondary | ICD-10-CM | POA: Diagnosis not present

## 2018-05-12 DIAGNOSIS — N2581 Secondary hyperparathyroidism of renal origin: Secondary | ICD-10-CM | POA: Diagnosis not present

## 2018-05-13 ENCOUNTER — Telehealth: Payer: Self-pay | Admitting: Internal Medicine

## 2018-05-13 DIAGNOSIS — N186 End stage renal disease: Secondary | ICD-10-CM | POA: Diagnosis not present

## 2018-05-13 DIAGNOSIS — N2581 Secondary hyperparathyroidism of renal origin: Secondary | ICD-10-CM | POA: Diagnosis not present

## 2018-05-13 MED ORDER — INSULIN ASPART 100 UNIT/ML ~~LOC~~ SOLN: 8.0000 [IU] | Freq: Three times a day (TID) | SUBCUTANEOUS | 5 refills | Status: DC

## 2018-05-13 NOTE — Telephone Encounter (Signed)
Copied from [place] [phone]. Topic: Quick Communication - See Telephone Encounter >> May 13, 2018 10:38 AM Antonieta Iba C wrote: CRM for notification. See Telephone encounter for: 05/13/18.   Refill for insulin aspart (NOVOLOG) 100 UNIT/ML injection   Pharmacy : [facility] on [address].

## 2018-05-14 DIAGNOSIS — N186 End stage renal disease: Secondary | ICD-10-CM | POA: Diagnosis not present

## 2018-05-14 DIAGNOSIS — N2581 Secondary hyperparathyroidism of renal origin: Secondary | ICD-10-CM | POA: Diagnosis not present

## 2018-05-15 DIAGNOSIS — N186 End stage renal disease: Secondary | ICD-10-CM | POA: Diagnosis not present

## 2018-05-15 DIAGNOSIS — N2581 Secondary hyperparathyroidism of renal origin: Secondary | ICD-10-CM | POA: Diagnosis not present

## 2018-05-16 DIAGNOSIS — N186 End stage renal disease: Secondary | ICD-10-CM | POA: Diagnosis not present

## 2018-05-16 DIAGNOSIS — N2581 Secondary hyperparathyroidism of renal origin: Secondary | ICD-10-CM | POA: Diagnosis not present

## 2018-05-17 DIAGNOSIS — N2581 Secondary hyperparathyroidism of renal origin: Secondary | ICD-10-CM | POA: Diagnosis not present

## 2018-05-17 DIAGNOSIS — N186 End stage renal disease: Secondary | ICD-10-CM | POA: Diagnosis not present

## 2018-05-18 DIAGNOSIS — N186 End stage renal disease: Secondary | ICD-10-CM | POA: Diagnosis not present

## 2018-05-18 DIAGNOSIS — N2581 Secondary hyperparathyroidism of renal origin: Secondary | ICD-10-CM | POA: Diagnosis not present

## 2018-05-19 DIAGNOSIS — N2581 Secondary hyperparathyroidism of renal origin: Secondary | ICD-10-CM | POA: Diagnosis not present

## 2018-05-19 DIAGNOSIS — N186 End stage renal disease: Secondary | ICD-10-CM | POA: Diagnosis not present

## 2018-05-20 DIAGNOSIS — N2581 Secondary hyperparathyroidism of renal origin: Secondary | ICD-10-CM | POA: Diagnosis not present

## 2018-05-20 DIAGNOSIS — N186 End stage renal disease: Secondary | ICD-10-CM | POA: Diagnosis not present

## 2018-05-21 DIAGNOSIS — Z23 Encounter for immunization: Secondary | ICD-10-CM | POA: Diagnosis not present

## 2018-05-21 DIAGNOSIS — N186 End stage renal disease: Secondary | ICD-10-CM | POA: Diagnosis not present

## 2018-05-21 DIAGNOSIS — N2581 Secondary hyperparathyroidism of renal origin: Secondary | ICD-10-CM | POA: Diagnosis not present

## 2018-05-22 DIAGNOSIS — N2581 Secondary hyperparathyroidism of renal origin: Secondary | ICD-10-CM | POA: Diagnosis not present

## 2018-05-22 DIAGNOSIS — N186 End stage renal disease: Secondary | ICD-10-CM | POA: Diagnosis not present

## 2018-05-23 DIAGNOSIS — N186 End stage renal disease: Secondary | ICD-10-CM | POA: Diagnosis not present

## 2018-05-23 DIAGNOSIS — N2581 Secondary hyperparathyroidism of renal origin: Secondary | ICD-10-CM | POA: Diagnosis not present

## 2018-05-24 DIAGNOSIS — N186 End stage renal disease: Secondary | ICD-10-CM | POA: Diagnosis not present

## 2018-05-24 DIAGNOSIS — N2581 Secondary hyperparathyroidism of renal origin: Secondary | ICD-10-CM | POA: Diagnosis not present

## 2018-05-25 DIAGNOSIS — N2581 Secondary hyperparathyroidism of renal origin: Secondary | ICD-10-CM | POA: Diagnosis not present

## 2018-05-25 DIAGNOSIS — N186 End stage renal disease: Secondary | ICD-10-CM | POA: Diagnosis not present

## 2018-05-26 DIAGNOSIS — N2581 Secondary hyperparathyroidism of renal origin: Secondary | ICD-10-CM | POA: Diagnosis not present

## 2018-05-26 DIAGNOSIS — N186 End stage renal disease: Secondary | ICD-10-CM | POA: Diagnosis not present

## 2018-05-27 DIAGNOSIS — N186 End stage renal disease: Secondary | ICD-10-CM | POA: Diagnosis not present

## 2018-05-27 DIAGNOSIS — N2581 Secondary hyperparathyroidism of renal origin: Secondary | ICD-10-CM | POA: Diagnosis not present

## 2018-05-28 DIAGNOSIS — N186 End stage renal disease: Secondary | ICD-10-CM | POA: Diagnosis not present

## 2018-05-28 DIAGNOSIS — N2581 Secondary hyperparathyroidism of renal origin: Secondary | ICD-10-CM | POA: Diagnosis not present

## 2018-05-29 DIAGNOSIS — N186 End stage renal disease: Secondary | ICD-10-CM | POA: Diagnosis not present

## 2018-05-29 DIAGNOSIS — N2581 Secondary hyperparathyroidism of renal origin: Secondary | ICD-10-CM | POA: Diagnosis not present

## 2018-05-30 DIAGNOSIS — N186 End stage renal disease: Secondary | ICD-10-CM | POA: Diagnosis not present

## 2018-05-30 DIAGNOSIS — N2581 Secondary hyperparathyroidism of renal origin: Secondary | ICD-10-CM | POA: Diagnosis not present

## 2018-05-31 DIAGNOSIS — N2581 Secondary hyperparathyroidism of renal origin: Secondary | ICD-10-CM | POA: Diagnosis not present

## 2018-05-31 DIAGNOSIS — N186 End stage renal disease: Secondary | ICD-10-CM | POA: Diagnosis not present

## 2018-06-01 DIAGNOSIS — N2581 Secondary hyperparathyroidism of renal origin: Secondary | ICD-10-CM | POA: Diagnosis not present

## 2018-06-01 DIAGNOSIS — N186 End stage renal disease: Secondary | ICD-10-CM | POA: Diagnosis not present

## 2018-06-02 DIAGNOSIS — N2581 Secondary hyperparathyroidism of renal origin: Secondary | ICD-10-CM | POA: Diagnosis not present

## 2018-06-02 DIAGNOSIS — N186 End stage renal disease: Secondary | ICD-10-CM | POA: Diagnosis not present

## 2018-06-03 DIAGNOSIS — N2581 Secondary hyperparathyroidism of renal origin: Secondary | ICD-10-CM | POA: Diagnosis not present

## 2018-06-03 DIAGNOSIS — N186 End stage renal disease: Secondary | ICD-10-CM | POA: Diagnosis not present

## 2018-06-04 DIAGNOSIS — N2581 Secondary hyperparathyroidism of renal origin: Secondary | ICD-10-CM | POA: Diagnosis not present

## 2018-06-04 DIAGNOSIS — N186 End stage renal disease: Secondary | ICD-10-CM | POA: Diagnosis not present

## 2018-06-05 DIAGNOSIS — N186 End stage renal disease: Secondary | ICD-10-CM | POA: Diagnosis not present

## 2018-06-05 DIAGNOSIS — N2581 Secondary hyperparathyroidism of renal origin: Secondary | ICD-10-CM | POA: Diagnosis not present

## 2018-06-06 DIAGNOSIS — N186 End stage renal disease: Secondary | ICD-10-CM | POA: Diagnosis not present

## 2018-06-06 DIAGNOSIS — N2581 Secondary hyperparathyroidism of renal origin: Secondary | ICD-10-CM | POA: Diagnosis not present

## 2018-06-07 DIAGNOSIS — N2581 Secondary hyperparathyroidism of renal origin: Secondary | ICD-10-CM | POA: Diagnosis not present

## 2018-06-07 DIAGNOSIS — N186 End stage renal disease: Secondary | ICD-10-CM | POA: Diagnosis not present

## 2018-06-08 DIAGNOSIS — N186 End stage renal disease: Secondary | ICD-10-CM | POA: Diagnosis not present

## 2018-06-08 DIAGNOSIS — N2581 Secondary hyperparathyroidism of renal origin: Secondary | ICD-10-CM | POA: Diagnosis not present

## 2018-06-09 DIAGNOSIS — N186 End stage renal disease: Secondary | ICD-10-CM | POA: Diagnosis not present

## 2018-06-09 DIAGNOSIS — N2581 Secondary hyperparathyroidism of renal origin: Secondary | ICD-10-CM | POA: Diagnosis not present

## 2018-06-10 DIAGNOSIS — N2581 Secondary hyperparathyroidism of renal origin: Secondary | ICD-10-CM | POA: Diagnosis not present

## 2018-06-10 DIAGNOSIS — N186 End stage renal disease: Secondary | ICD-10-CM | POA: Diagnosis not present

## 2018-06-11 DIAGNOSIS — N186 End stage renal disease: Secondary | ICD-10-CM | POA: Diagnosis not present

## 2018-06-11 DIAGNOSIS — N2581 Secondary hyperparathyroidism of renal origin: Secondary | ICD-10-CM | POA: Diagnosis not present

## 2018-06-12 ENCOUNTER — Ambulatory Visit: Payer: Federal, State, Local not specified - PPO | Admitting: Family Medicine

## 2018-06-12 ENCOUNTER — Encounter: Payer: Self-pay | Admitting: Family Medicine

## 2018-06-12 VITALS — BP 136/88 | HR 103 | Temp 97.8°F | Ht 66.0 in | Wt 211.0 lb

## 2018-06-12 DIAGNOSIS — Z992 Dependence on renal dialysis: Secondary | ICD-10-CM | POA: Diagnosis not present

## 2018-06-12 DIAGNOSIS — N186 End stage renal disease: Secondary | ICD-10-CM | POA: Diagnosis not present

## 2018-06-12 DIAGNOSIS — J0100 Acute maxillary sinusitis, unspecified: Secondary | ICD-10-CM

## 2018-06-12 DIAGNOSIS — E1022 Type 1 diabetes mellitus with diabetic chronic kidney disease: Secondary | ICD-10-CM | POA: Diagnosis not present

## 2018-06-12 DIAGNOSIS — N2581 Secondary hyperparathyroidism of renal origin: Secondary | ICD-10-CM | POA: Diagnosis not present

## 2018-06-12 MED ORDER — DOXYCYCLINE HYCLATE 100 MG PO TABS: 100.0000 mg | ORAL_TABLET | Freq: Two times a day (BID) | ORAL | 0 refills | Status: DC

## 2018-06-12 NOTE — Progress Notes (Signed)
Patient ID: Hector Mahoney, male   DOB: [birth year], 47 y.o.   MRN: [id number]  PCP: Biagio Borg, MD  Subjective:  Hector Mahoney is a 47 y.o. year old very pleasant male patient who presents with symptoms including nasal congestion, mild sore throat, sinus pressure, post nasal drip, and cough that is nonproductive -started: 5 days ago , symptoms are not improving -previous treatments: Theraflu and Tylenol cold have provided limited benefit -sick contacts/travel/risks: denies flu exposure. Recent sick contact exposure at church. No known flu exposure. No influenza vaccine this year -Hx of: Esophageal reflux, Type 1 diabetes, CKD  ROS-denies fever, SOB, NVD, tooth pain  Pertinent Past Medical History- Type 1 diabetes, CKD,   Medications- reviewed  Current Outpatient Medications  Medication Sig Dispense Refill  . amLODipine (NORVASC) 5 MG tablet Take 1 tablet (5 mg total) by mouth daily. 30 tablet 1  . aspirin EC 81 MG EC tablet Take 1 tablet (81 mg total) by mouth daily. 30 tablet 0  . calcitRIOL (ROCALTROL) 0.25 MCG capsule Take 1 capsule (0.25 mcg total) by mouth every Monday, Wednesday, and Friday at 6 PM. 15 capsule 0  . calcium acetate (PHOSLO) 667 MG capsule Take 2 capsules (1,334 mg total) by mouth 3 (three) times daily with meals. 180 capsule 3  . cyclobenzaprine (FLEXERIL) 10 MG tablet Take 1 tablet (10 mg total) by mouth at bedtime as needed for muscle spasms. 90 tablet 01  . ferrous sulfate 325 (65 FE) MG tablet Take 1 tablet (325 mg total) by mouth 2 (two) times daily with a meal. 60 tablet 0  . insulin aspart (NOVOLOG) 100 UNIT/ML injection Inject 8-12 Units into the skin 3 (three) times daily with meals. 2 vial 5  . insulin glargine (LANTUS) 100 UNIT/ML injection Inject 0.25 mLs (25 Units total) into the skin at bedtime. 20 mL 5  . isosorbide mononitrate (IMDUR) 30 MG 24 hr tablet TAKE ONE-HALF TABLET BY MOUTH ONCE DAILY (Patient taking differently: TAKE 15 mg  TABLET  BY MOUTH ONCE DAILY) 90 tablet 1  . Menthol-Methyl Salicylate (MUSCLE RUB) 10-15 % CREA Apply 1 application topically as needed for muscle pain (for leg cramps). 35 g 1  . polyethylene glycol (MIRALAX / GLYCOLAX) packet Take 17 g by mouth daily as needed for moderate constipation (over the counter). 14 each 0  . rOPINIRole (REQUIP) 0.25 MG tablet TAKE 1 TABLET BY MOUTH AT BEDTIME 90 tablet 3  . rosuvastatin (CRESTOR) 10 MG tablet Take 1 tablet (10 mg total) by mouth daily. 30 tablet 3  . traMADol (ULTRAM) 50 MG tablet Take 1 tablet (50 mg total) by mouth every 8 (eight) hours as needed for moderate pain. 20 tablet 0   No current facility-administered medications for this visit.     Objective: BP 136/88   Pulse (!) 103   Temp 97.8 F (36.6 C) (Oral)   Ht 5\' 6"  (1.676 m)   Wt 211 lb (95.7 kg)   SpO2 96%   BMI 34.06 kg/m  retake of pulse: 99 Gen: NAD, resting comfortably HEENT: Turbinates erythematous, TMs normal bilaterally, pharynx mildly erythematous with no  exudate or edema, + maxillary sinus tenderness. Post nasal drip present CV: RRR no murmurs rubs or gallops Lungs: CTAB no crackles, wheeze, rhonchi Abdomen: soft/nontender/nondistended/normal bowel sounds. No rebound or guarding.  Ext: no edema Skin: warm, dry, no rash Neuro: grossly normal, moves all extremities  Assessment/Plan: 1. Acute maxillary sinusitis, recurrence not specified Exam and history are most  consistent with sinusitis with maxillary sinus pressure as most bothersome symptom. Advised patient on supportive measures:  Get rest,and use tylenol as needed. With history of DM and dialysis, opted to treat with doxycycline at this time. Further advised that he follow up if fever >101, if symptoms worsen or if symptoms are not improved in 3-4 days. Patient  verbalizes understanding and agrees with plan.   - doxycycline (VIBRA-TABS) 100 MG tablet; Take 1 tablet (100 mg total) by mouth 2 (two) times daily.  Dispense: 20  tablet; Refill: 0  Finally, we reviewed reasons to return to care including if symptoms worsen or persist or new concerns arise- once again particularly shortness of breath or fever.  Laurita Quint, FNP

## 2018-06-12 NOTE — Patient Instructions (Signed)
Get plenty of rest, use tylenol  as needed for discomfort and follow up if symptoms do not improve in 3 to 4 days, worsen, or you develop a fever >101.  Please take antibiotic with food as directed and follow up with your kidney specialist this week as scheduled. Please let them know new medication changes for symptoms.  Feel better soon!   Sinusitis, Adult Sinusitis is soreness and inflammation of your sinuses. Sinuses are hollow spaces in the bones around your face. They are located:  Around your eyes.  In the middle of your forehead.  Behind your nose.  In your cheekbones.  Your sinuses and nasal passages are lined with a stringy fluid (mucus). Mucus normally drains out of your sinuses. When your nasal tissues get inflamed or swollen, the mucus can get trapped or blocked so air cannot flow through your sinuses. This lets bacteria, viruses, and funguses grow, and that leads to infection. Follow these instructions at home: Medicines  Take, use, or apply over-the-counter and prescription medicines only as told by your doctor. These may include nasal sprays.  If you were prescribed an antibiotic medicine, take it as told by your doctor. Do not stop taking the antibiotic even if you start to feel better. Hydrate and Humidify  Drink enough water to keep your pee (urine) clear or pale yellow.  Use a cool mist humidifier to keep the humidity level in your home above 50%.  Breathe in steam for 10-15 minutes, 3-4 times a day or as told by your doctor. You can do this in the bathroom while a hot shower is running.  Try not to spend time in cool or dry air. Rest  Rest as much as possible.  Sleep with your head raised (elevated).  Make sure to get enough sleep each night. General instructions  Put a warm, moist washcloth on your face 3-4 times a day or as told by your doctor. This will help with discomfort.  Wash your hands often with soap and water. If there is no soap and water,  use hand sanitizer.  Do not smoke. Avoid being around people who are smoking (secondhand smoke).  Keep all follow-up visits as told by your doctor. This is important. Contact a doctor if:  You have a fever.  Your symptoms get worse.  Your symptoms do not get better within 10 days. Get help right away if:  You have a very bad headache.  You cannot stop throwing up (vomiting).  You have pain or swelling around your face or eyes.  You have trouble seeing.  You feel confused.  Your neck is stiff.  You have trouble breathing. This information is not intended to replace advice given to you by your health care provider. Make sure you discuss any questions you have with your health care provider. Document Released: 01/16/2008 Document Revised: 03/25/2016 Document Reviewed: 05/25/2015 Elsevier Interactive Patient Education  [employer].

## 2018-06-13 DIAGNOSIS — N186 End stage renal disease: Secondary | ICD-10-CM | POA: Diagnosis not present

## 2018-06-13 DIAGNOSIS — N2581 Secondary hyperparathyroidism of renal origin: Secondary | ICD-10-CM | POA: Diagnosis not present

## 2018-06-14 DIAGNOSIS — N186 End stage renal disease: Secondary | ICD-10-CM | POA: Diagnosis not present

## 2018-06-14 DIAGNOSIS — N2581 Secondary hyperparathyroidism of renal origin: Secondary | ICD-10-CM | POA: Diagnosis not present

## 2018-06-15 DIAGNOSIS — N2581 Secondary hyperparathyroidism of renal origin: Secondary | ICD-10-CM | POA: Diagnosis not present

## 2018-06-15 DIAGNOSIS — N186 End stage renal disease: Secondary | ICD-10-CM | POA: Diagnosis not present

## 2018-06-16 DIAGNOSIS — N186 End stage renal disease: Secondary | ICD-10-CM | POA: Diagnosis not present

## 2018-06-16 DIAGNOSIS — N2581 Secondary hyperparathyroidism of renal origin: Secondary | ICD-10-CM | POA: Diagnosis not present

## 2018-06-17 DIAGNOSIS — N2581 Secondary hyperparathyroidism of renal origin: Secondary | ICD-10-CM | POA: Diagnosis not present

## 2018-06-17 DIAGNOSIS — N186 End stage renal disease: Secondary | ICD-10-CM | POA: Diagnosis not present

## 2018-06-18 DIAGNOSIS — N2581 Secondary hyperparathyroidism of renal origin: Secondary | ICD-10-CM | POA: Diagnosis not present

## 2018-06-18 DIAGNOSIS — N186 End stage renal disease: Secondary | ICD-10-CM | POA: Diagnosis not present

## 2018-06-19 DIAGNOSIS — N2581 Secondary hyperparathyroidism of renal origin: Secondary | ICD-10-CM | POA: Diagnosis not present

## 2018-06-19 DIAGNOSIS — N186 End stage renal disease: Secondary | ICD-10-CM | POA: Diagnosis not present

## 2018-06-20 DIAGNOSIS — N2581 Secondary hyperparathyroidism of renal origin: Secondary | ICD-10-CM | POA: Diagnosis not present

## 2018-06-20 DIAGNOSIS — N186 End stage renal disease: Secondary | ICD-10-CM | POA: Diagnosis not present

## 2018-06-21 DIAGNOSIS — N2581 Secondary hyperparathyroidism of renal origin: Secondary | ICD-10-CM | POA: Diagnosis not present

## 2018-06-21 DIAGNOSIS — N186 End stage renal disease: Secondary | ICD-10-CM | POA: Diagnosis not present

## 2018-06-22 DIAGNOSIS — N186 End stage renal disease: Secondary | ICD-10-CM | POA: Diagnosis not present

## 2018-06-22 DIAGNOSIS — N2581 Secondary hyperparathyroidism of renal origin: Secondary | ICD-10-CM | POA: Diagnosis not present

## 2018-06-23 DIAGNOSIS — N2581 Secondary hyperparathyroidism of renal origin: Secondary | ICD-10-CM | POA: Diagnosis not present

## 2018-06-23 DIAGNOSIS — N186 End stage renal disease: Secondary | ICD-10-CM | POA: Diagnosis not present

## 2018-06-24 DIAGNOSIS — N2581 Secondary hyperparathyroidism of renal origin: Secondary | ICD-10-CM | POA: Diagnosis not present

## 2018-06-24 DIAGNOSIS — N186 End stage renal disease: Secondary | ICD-10-CM | POA: Diagnosis not present

## 2018-06-25 DIAGNOSIS — N2581 Secondary hyperparathyroidism of renal origin: Secondary | ICD-10-CM | POA: Diagnosis not present

## 2018-06-25 DIAGNOSIS — N186 End stage renal disease: Secondary | ICD-10-CM | POA: Diagnosis not present

## 2018-06-26 DIAGNOSIS — N2581 Secondary hyperparathyroidism of renal origin: Secondary | ICD-10-CM | POA: Diagnosis not present

## 2018-06-26 DIAGNOSIS — N186 End stage renal disease: Secondary | ICD-10-CM | POA: Diagnosis not present

## 2018-06-27 ENCOUNTER — Ambulatory Visit: Payer: Federal, State, Local not specified - PPO | Admitting: Family

## 2018-06-27 DIAGNOSIS — N2581 Secondary hyperparathyroidism of renal origin: Secondary | ICD-10-CM | POA: Diagnosis not present

## 2018-06-27 DIAGNOSIS — Z0289 Encounter for other administrative examinations: Secondary | ICD-10-CM

## 2018-06-27 DIAGNOSIS — Z7682 Awaiting organ transplant status: Secondary | ICD-10-CM | POA: Diagnosis not present

## 2018-06-27 DIAGNOSIS — N186 End stage renal disease: Secondary | ICD-10-CM | POA: Diagnosis not present

## 2018-06-28 DIAGNOSIS — N186 End stage renal disease: Secondary | ICD-10-CM | POA: Diagnosis not present

## 2018-06-28 DIAGNOSIS — N2581 Secondary hyperparathyroidism of renal origin: Secondary | ICD-10-CM | POA: Diagnosis not present

## 2018-06-29 DIAGNOSIS — N2581 Secondary hyperparathyroidism of renal origin: Secondary | ICD-10-CM | POA: Diagnosis not present

## 2018-06-29 DIAGNOSIS — N186 End stage renal disease: Secondary | ICD-10-CM | POA: Diagnosis not present

## 2018-06-30 DIAGNOSIS — N2581 Secondary hyperparathyroidism of renal origin: Secondary | ICD-10-CM | POA: Diagnosis not present

## 2018-06-30 DIAGNOSIS — N186 End stage renal disease: Secondary | ICD-10-CM | POA: Diagnosis not present

## 2018-07-01 DIAGNOSIS — N2581 Secondary hyperparathyroidism of renal origin: Secondary | ICD-10-CM | POA: Diagnosis not present

## 2018-07-01 DIAGNOSIS — N186 End stage renal disease: Secondary | ICD-10-CM | POA: Diagnosis not present

## 2018-07-02 DIAGNOSIS — N186 End stage renal disease: Secondary | ICD-10-CM | POA: Diagnosis not present

## 2018-07-02 DIAGNOSIS — N2581 Secondary hyperparathyroidism of renal origin: Secondary | ICD-10-CM | POA: Diagnosis not present

## 2018-07-03 DIAGNOSIS — N2581 Secondary hyperparathyroidism of renal origin: Secondary | ICD-10-CM | POA: Diagnosis not present

## 2018-07-03 DIAGNOSIS — N186 End stage renal disease: Secondary | ICD-10-CM | POA: Diagnosis not present

## 2018-07-04 DIAGNOSIS — N186 End stage renal disease: Secondary | ICD-10-CM | POA: Diagnosis not present

## 2018-07-04 DIAGNOSIS — N2581 Secondary hyperparathyroidism of renal origin: Secondary | ICD-10-CM | POA: Diagnosis not present

## 2018-07-05 DIAGNOSIS — N186 End stage renal disease: Secondary | ICD-10-CM | POA: Diagnosis not present

## 2018-07-05 DIAGNOSIS — N2581 Secondary hyperparathyroidism of renal origin: Secondary | ICD-10-CM | POA: Diagnosis not present

## 2018-07-06 DIAGNOSIS — N2581 Secondary hyperparathyroidism of renal origin: Secondary | ICD-10-CM | POA: Diagnosis not present

## 2018-07-06 DIAGNOSIS — N186 End stage renal disease: Secondary | ICD-10-CM | POA: Diagnosis not present

## 2018-07-07 DIAGNOSIS — N2581 Secondary hyperparathyroidism of renal origin: Secondary | ICD-10-CM | POA: Diagnosis not present

## 2018-07-07 DIAGNOSIS — N186 End stage renal disease: Secondary | ICD-10-CM | POA: Diagnosis not present

## 2018-07-08 ENCOUNTER — Encounter: Payer: Self-pay | Admitting: Family

## 2018-07-08 ENCOUNTER — Ambulatory Visit: Payer: Federal, State, Local not specified - PPO | Admitting: Family

## 2018-07-08 VITALS — BP 150/98 | HR 98 | Temp 98.1°F | Ht 66.0 in | Wt 216.1 lb

## 2018-07-08 DIAGNOSIS — N2581 Secondary hyperparathyroidism of renal origin: Secondary | ICD-10-CM | POA: Diagnosis not present

## 2018-07-08 DIAGNOSIS — N186 End stage renal disease: Secondary | ICD-10-CM | POA: Diagnosis not present

## 2018-07-08 DIAGNOSIS — R600 Localized edema: Secondary | ICD-10-CM | POA: Diagnosis not present

## 2018-07-08 NOTE — Progress Notes (Signed)
Hector Mahoney is a 47 y.o. male with the following history as recorded in [employer]:  Patient Active Problem List   Diagnosis Date Noted  . Abdominal pain 03/26/2018  . Nausea 03/26/2018  . Diarrhea 03/26/2018  . Acute renal failure superimposed on stage 5 chronic kidney disease, not on chronic dialysis ([place]) 10/20/2017  . Hyperkalemia 10/20/2017  . CKD stage 5 due to type 1 diabetes mellitus ([place]) 09/27/2017  . Low back pain 03/29/2017  . Nocturnal leg cramps 04/03/2016  . Hypertensive urgency 09/02/2015  . Acute on chronic diastolic heart failure ([place]) 09/02/2015  . Patient nonadherence 09/02/2015  . Dyspnea 12/22/2012  . Insulin dependent diabetes mellitus with complications ([place]) 93/71/6967  . Preventative health care 04/01/2011  . Esophageal reflux 04/27/2009  . INSOMNIA-SLEEP DISORDER-UNSPEC 04/27/2009  . Type 1 diabetes mellitus ([place]) 07/30/2007  . Dyslipidemia 04/24/2007  . Essential hypertension 04/24/2007    Current Outpatient Medications  Medication Sig Dispense Refill  . amLODipine (NORVASC) 5 MG tablet Take 1 tablet (5 mg total) by mouth daily. 30 tablet 1  . aspirin EC 81 MG EC tablet Take 1 tablet (81 mg total) by mouth daily. 30 tablet 0  . calcitRIOL (ROCALTROL) 0.25 MCG capsule Take 1 capsule (0.25 mcg total) by mouth every Monday, Wednesday, and Friday at 6 PM. 15 capsule 0  . calcium acetate (PHOSLO) 667 MG capsule Take 2 capsules (1,334 mg total) by mouth 3 (three) times daily with meals. (Patient taking differently: Take 1,334 mg by mouth 3 (three) times daily with meals. ) 180 capsule 3  . cinacalcet (SENSIPAR) 60 MG tablet Take 60 mg by mouth daily.    . cyclobenzaprine (FLEXERIL) 10 MG tablet Take 1 tablet (10 mg total) by mouth at bedtime as needed for muscle spasms. 90 tablet 01  . doxycycline (VIBRA-TABS) 100 MG tablet Take 1 tablet (100 mg total) by mouth 2 (two) times daily. 20 tablet 0  . ferrous sulfate 325 (65 FE) MG tablet Take 1 tablet (325 mg  total) by mouth 2 (two) times daily with a meal. 60 tablet 0  . insulin aspart (NOVOLOG) 100 UNIT/ML injection Inject 8-12 Units into the skin 3 (three) times daily with meals. 2 vial 5  . insulin glargine (LANTUS) 100 UNIT/ML injection Inject 0.25 mLs (25 Units total) into the skin at bedtime. 20 mL 5  . isosorbide mononitrate (IMDUR) 30 MG 24 hr tablet TAKE ONE-HALF TABLET BY MOUTH ONCE DAILY (Patient taking differently: TAKE 15 mg  TABLET BY MOUTH ONCE DAILY) 90 tablet 1  . Menthol-Methyl Salicylate (MUSCLE RUB) 10-15 % CREA Apply 1 application topically as needed for muscle pain (for leg cramps). 35 g 1  . polyethylene glycol (MIRALAX / GLYCOLAX) packet Take 17 g by mouth daily as needed for moderate constipation (over the counter). 14 each 0  . rOPINIRole (REQUIP) 0.25 MG tablet TAKE 1 TABLET BY MOUTH AT BEDTIME 90 tablet 3  . rosuvastatin (CRESTOR) 10 MG tablet Take 1 tablet (10 mg total) by mouth daily. 30 tablet 3  . traMADol (ULTRAM) 50 MG tablet Take 1 tablet (50 mg total) by mouth every 8 (eight) hours as needed for moderate pain. 20 tablet 0   No current facility-administered medications for this visit.     Allergies: Patient has no known allergies.  Past Medical History:  Diagnosis Date  . ABSCESS, TOOTH 04/23/2009  . AKI (acute kidney injury) ([place]) 08/2015  . CHF (congestive heart failure) ([place])   . DIABETES MELLITUS, TYPE  I 07/30/2007  . Esophageal reflux 04/27/2009  . FATIGUE 04/27/2009  . HYPERLIPIDEMIA 04/24/2007  . HYPERTENSION 04/24/2007  . INSOMNIA-SLEEP DISORDER-UNSPEC 04/27/2009  . LUMBAR STRAIN, ACUTE 12/11/2008  . RASH-NONVESICULAR 03/11/2008  . Shortness of breath    "lying down and w/exertion right now" (12/22/2012)    Past Surgical History:  Procedure Laterality Date  . IR FLUORO GUIDE CV LINE RIGHT  10/22/2017  . IR US GUIDE VASC ACCESS RIGHT  10/22/2017  . MOUTH SURGERY      Family History  Problem Relation Age of Onset  . Hypertension Mother   . Diabetes Mother    . Diabetes Maternal Aunt   . Kidney disease Neg Hx     Social History   Tobacco Use  . Smoking status: Former Smoker    Types: Cigarettes    Last attempt to quit: 05/03/2015    Years since quitting: 3.1  . Smokeless tobacco: Never Used  . Tobacco comment: 12/22/2012 "used to smoke cigarettes once or twice/month; haven't had any cigarettes for years"  Substance Use Topics  . Alcohol use: Yes    Comment: occ    Subjective:  Swelling in lower extremities x 4 days; known history of heart failure/ renal failure; does peritoneal dialysis at home every night; notes that swelling today does seem improved compared to what he noticed last night; denies any chest pain, shortness of breath, blurred vision; has been weighing himself and feels his daily weight is only up about 2 pounds; not able to use diuretics due to kidney disease; has not contacted his nephrologist regarding his symptoms; was due to see his cardiologist in follow-up in September- overdue for follow-up there; requesting work note for today;    Objective:  Vitals:   07/08/18 1017  BP: (!) 150/98  Pulse: 98  Temp: 98.1 F (36.7 C)  TempSrc: Oral  SpO2: 99%  Weight: 216 lb 1.3 oz (98 kg)  Height: _0  (1.676 m)    General: Well developed, well nourished, in no acute distress  Skin : Warm and dry.  Head: Normocephalic and atraumatic  Eyes: Sclera and conjunctiva clear; pupils round and reactive to light; extraocular movements intact  Ears: External normal; canals clear; tympanic membranes normal  Oropharynx: Pink, supple. No suspicious lesions  Neck: Supple without thyromegaly, adenopathy  Lungs: Respirations unlabored; clear to auscultation bilaterally without wheeze, rales, rhonchi  CVS exam: normal rate and regular rhythm.  Extremities: No edema, cyanosis, clubbing  Vessels: Symmetric bilaterally  Neurologic: Alert and oriented; speech intact; face symmetrical; moves all extremities well; CNII-XII intact without  focal deficit   Assessment:  1. Pedal edema     Plan:  Physical exam is reassuring- very little swelling noted; Have explained to patient that in general, he needs to contact his nephrologist first with concerns about swelling in his extremities; may need to have his dialysis regimen adjusted- very little that primary care can do/ apparently cardiology has tried to explain similar information as well; would like to update labs today and CXR to ensure no acute emergency; work note given for today as requested; encouraged him to go ahead and call his nephrologist today.   No follow-ups on file.  Orders Placed This Encounter  Procedures  . DG Chest 2 View    Standing Status:   Future    Standing Expiration Date:   09/08/2019    Order Specific Question:   Reason for Exam (SYMPTOM  OR DIAGNOSIS REQUIRED)    Answer:  pedal edema    Order Specific Question:   Preferred imaging location?    Answer:   [facility]    Order Specific Question:   Radiology Contrast Protocol - do NOT remove file path    Answer:   [email or web address]  . CBC w/Diff    Standing Status:   Future    Standing Expiration Date:   07/08/2019  . Comp Met (CMET)    Standing Status:   Future    Standing Expiration Date:   07/08/2019  . B Nat Peptide    Standing Status:   Future    Standing Expiration Date:   07/08/2019    Requested Prescriptions    No prescriptions requested or ordered in this encounter

## 2018-07-09 DIAGNOSIS — N2581 Secondary hyperparathyroidism of renal origin: Secondary | ICD-10-CM | POA: Diagnosis not present

## 2018-07-09 DIAGNOSIS — N186 End stage renal disease: Secondary | ICD-10-CM | POA: Diagnosis not present

## 2018-07-10 DIAGNOSIS — N186 End stage renal disease: Secondary | ICD-10-CM | POA: Diagnosis not present

## 2018-07-10 DIAGNOSIS — N2581 Secondary hyperparathyroidism of renal origin: Secondary | ICD-10-CM | POA: Diagnosis not present

## 2018-07-11 DIAGNOSIS — N186 End stage renal disease: Secondary | ICD-10-CM | POA: Diagnosis not present

## 2018-07-11 DIAGNOSIS — N2581 Secondary hyperparathyroidism of renal origin: Secondary | ICD-10-CM | POA: Diagnosis not present

## 2018-07-12 DIAGNOSIS — N186 End stage renal disease: Secondary | ICD-10-CM | POA: Diagnosis not present

## 2018-07-12 DIAGNOSIS — E1022 Type 1 diabetes mellitus with diabetic chronic kidney disease: Secondary | ICD-10-CM | POA: Diagnosis not present

## 2018-07-12 DIAGNOSIS — Z992 Dependence on renal dialysis: Secondary | ICD-10-CM | POA: Diagnosis not present

## 2018-07-12 DIAGNOSIS — N2581 Secondary hyperparathyroidism of renal origin: Secondary | ICD-10-CM | POA: Diagnosis not present

## 2018-07-13 DIAGNOSIS — Z23 Encounter for immunization: Secondary | ICD-10-CM | POA: Diagnosis not present

## 2018-07-13 DIAGNOSIS — N186 End stage renal disease: Secondary | ICD-10-CM | POA: Diagnosis not present

## 2018-07-13 DIAGNOSIS — N2581 Secondary hyperparathyroidism of renal origin: Secondary | ICD-10-CM | POA: Diagnosis not present

## 2018-07-14 DIAGNOSIS — N186 End stage renal disease: Secondary | ICD-10-CM | POA: Diagnosis not present

## 2018-07-14 DIAGNOSIS — N2581 Secondary hyperparathyroidism of renal origin: Secondary | ICD-10-CM | POA: Diagnosis not present

## 2018-07-14 DIAGNOSIS — Z23 Encounter for immunization: Secondary | ICD-10-CM | POA: Diagnosis not present

## 2018-07-15 DIAGNOSIS — N186 End stage renal disease: Secondary | ICD-10-CM | POA: Diagnosis not present

## 2018-07-15 DIAGNOSIS — Z23 Encounter for immunization: Secondary | ICD-10-CM | POA: Diagnosis not present

## 2018-07-15 DIAGNOSIS — N2581 Secondary hyperparathyroidism of renal origin: Secondary | ICD-10-CM | POA: Diagnosis not present

## 2018-07-16 DIAGNOSIS — Z23 Encounter for immunization: Secondary | ICD-10-CM | POA: Diagnosis not present

## 2018-07-16 DIAGNOSIS — N2581 Secondary hyperparathyroidism of renal origin: Secondary | ICD-10-CM | POA: Diagnosis not present

## 2018-07-16 DIAGNOSIS — N186 End stage renal disease: Secondary | ICD-10-CM | POA: Diagnosis not present

## 2018-07-17 DIAGNOSIS — N2581 Secondary hyperparathyroidism of renal origin: Secondary | ICD-10-CM | POA: Diagnosis not present

## 2018-07-17 DIAGNOSIS — N186 End stage renal disease: Secondary | ICD-10-CM | POA: Diagnosis not present

## 2018-07-17 DIAGNOSIS — Z23 Encounter for immunization: Secondary | ICD-10-CM | POA: Diagnosis not present

## 2018-07-18 DIAGNOSIS — N2581 Secondary hyperparathyroidism of renal origin: Secondary | ICD-10-CM | POA: Diagnosis not present

## 2018-07-18 DIAGNOSIS — N186 End stage renal disease: Secondary | ICD-10-CM | POA: Diagnosis not present

## 2018-07-18 DIAGNOSIS — Z23 Encounter for immunization: Secondary | ICD-10-CM | POA: Diagnosis not present

## 2018-07-19 DIAGNOSIS — Z23 Encounter for immunization: Secondary | ICD-10-CM | POA: Diagnosis not present

## 2018-07-19 DIAGNOSIS — N2581 Secondary hyperparathyroidism of renal origin: Secondary | ICD-10-CM | POA: Diagnosis not present

## 2018-07-19 DIAGNOSIS — N186 End stage renal disease: Secondary | ICD-10-CM | POA: Diagnosis not present

## 2018-07-20 ENCOUNTER — Encounter (HOSPITAL_COMMUNITY): Payer: Self-pay | Admitting: Emergency Medicine

## 2018-07-20 ENCOUNTER — Emergency Department (HOSPITAL_COMMUNITY)
Admission: EM | Admit: 2018-07-20 | Discharge: 2018-07-20 | Disposition: A | Discharge status: Home / Self Care | Payer: Federal, State, Local not specified - PPO | Attending: Emergency Medicine | Admitting: Emergency Medicine

## 2018-07-20 ENCOUNTER — Other Ambulatory Visit: Payer: Self-pay

## 2018-07-20 DIAGNOSIS — Z7982 Long term (current) use of aspirin: Secondary | ICD-10-CM | POA: Insufficient documentation

## 2018-07-20 DIAGNOSIS — N185 Chronic kidney disease, stage 5: Secondary | ICD-10-CM | POA: Diagnosis not present

## 2018-07-20 DIAGNOSIS — N2581 Secondary hyperparathyroidism of renal origin: Secondary | ICD-10-CM | POA: Diagnosis not present

## 2018-07-20 DIAGNOSIS — Z79899 Other long term (current) drug therapy: Secondary | ICD-10-CM | POA: Insufficient documentation

## 2018-07-20 DIAGNOSIS — Z794 Long term (current) use of insulin: Secondary | ICD-10-CM | POA: Insufficient documentation

## 2018-07-20 DIAGNOSIS — Z87891 Personal history of nicotine dependence: Secondary | ICD-10-CM | POA: Insufficient documentation

## 2018-07-20 DIAGNOSIS — N186 End stage renal disease: Secondary | ICD-10-CM | POA: Diagnosis not present

## 2018-07-20 DIAGNOSIS — R1013 Epigastric pain: Secondary | ICD-10-CM

## 2018-07-20 DIAGNOSIS — E663 Overweight: Secondary | ICD-10-CM | POA: Diagnosis not present

## 2018-07-20 DIAGNOSIS — I132 Hypertensive heart and chronic kidney disease with heart failure and with stage 5 chronic kidney disease, or end stage renal disease: Secondary | ICD-10-CM | POA: Insufficient documentation

## 2018-07-20 DIAGNOSIS — E1022 Type 1 diabetes mellitus with diabetic chronic kidney disease: Secondary | ICD-10-CM | POA: Insufficient documentation

## 2018-07-20 DIAGNOSIS — I5033 Acute on chronic diastolic (congestive) heart failure: Secondary | ICD-10-CM | POA: Diagnosis not present

## 2018-07-20 LAB — CBC
HCT: 34.3 % — ABNORMAL LOW (ref 39.0–52.0)
Hemoglobin: 10.9 g/dL — ABNORMAL LOW (ref 13.0–17.0)
MCH: 26.8 pg (ref 26.0–34.0)
MCHC: 31.8 g/dL (ref 30.0–36.0)
MCV: 84.3 fL (ref 80.0–100.0)
Platelets: 243 10*3/uL (ref 150–400)
RBC: 4.07 MIL/uL — AB (ref 4.22–5.81)
RDW: 14.4 % (ref 11.5–15.5)
WBC: 11.2 10*3/uL — ABNORMAL HIGH (ref 4.0–10.5)
nRBC: 0 % (ref 0.0–0.2)

## 2018-07-20 LAB — URINALYSIS, ROUTINE W REFLEX MICROSCOPIC
BILIRUBIN URINE: NEGATIVE
Glucose, UA: 50 mg/dL — AB
KETONES UR: NEGATIVE mg/dL
Leukocytes, UA: NEGATIVE
NITRITE: NEGATIVE
Protein, ur: 100 mg/dL — AB
Specific Gravity, Urine: 1.013 (ref 1.005–1.030)
pH: 6 (ref 5.0–8.0)

## 2018-07-20 LAB — COMPREHENSIVE METABOLIC PANEL
ALT: 24 U/L (ref 0–44)
AST: 17 U/L (ref 15–41)
Albumin: 3 g/dL — ABNORMAL LOW (ref 3.5–5.0)
Alkaline Phosphatase: 95 U/L (ref 38–126)
Anion gap: 14 (ref 5–15)
BUN: 69 mg/dL — ABNORMAL HIGH (ref 6–20)
CHLORIDE: 100 mmol/L (ref 98–111)
CO2: 25 mmol/L (ref 22–32)
Calcium: 7.1 mg/dL — ABNORMAL LOW (ref 8.9–10.3)
Creatinine, Ser: 11.96 mg/dL — ABNORMAL HIGH (ref 0.61–1.24)
GFR calc Af Amer: 5 mL/min — ABNORMAL LOW (ref >60)
GFR calc non Af Amer: 5 mL/min — ABNORMAL LOW (ref >60)
Glucose, Bld: 130 mg/dL — ABNORMAL HIGH (ref 70–99)
Potassium: 3.9 mmol/L (ref 3.5–5.1)
Sodium: 139 mmol/L (ref 135–145)
Total Bilirubin: 0.5 mg/dL (ref 0.3–1.2)
Total Protein: 6.4 g/dL — ABNORMAL LOW (ref 6.5–8.1)

## 2018-07-20 LAB — LIPASE, BLOOD: Lipase: 38 U/L (ref 11–51)

## 2018-07-20 MED ORDER — FAMOTIDINE 20 MG PO TABS
20.0000 mg | ORAL_TABLET | Freq: Once | ORAL | Status: AC
  Administered 2018-07-20: 20 mg via ORAL
  Filled 2018-07-20: qty 1

## 2018-07-20 MED ORDER — ALUM & MAG HYDROXIDE-SIMETH 200-200-20 MG/5ML PO SUSP
30.0000 mL | Freq: Once | ORAL | Status: AC
  Administered 2018-07-20: 30 mL via ORAL
  Filled 2018-07-20: qty 30

## 2018-07-20 MED ORDER — OMEPRAZOLE 20 MG PO CPDR: 20.0000 mg | DELAYED_RELEASE_CAPSULE | Freq: Every day | ORAL | 0 refills | Status: DC

## 2018-07-20 NOTE — ED Provider Notes (Signed)
[facility] Provider Note   CSN: [id number] Arrival date & time: 07/20/18  1027     History   Chief Complaint Chief Complaint  Patient presents with  . Abdominal Pain    HPI Leon Montoya Afzal is a 47 y.o. male.  HPI  This is a 47 year old male with history of heart failure, diabetes, end-stage renal disease on peritoneal dialysis who presents with 3-week history of waxing and waning abdominal pain.  Patient reports upper abdominal discomfort that happens mostly at night.  He describes it as "a queasy feeling."  Currently his pain is 6 out of 10.  His pain does improve somewhat with Alka-Seltzer and Pepcid.  Denies any radiation of pain.  He is unsure whether it is associated with food.  He reports some nausea.  No vomiting or diarrhea.  He reports normal stools.  He denies any fevers.  He continues to do peritoneal dialysis.  Has not noted any changes in peritoneal fluid.  Past Medical History:  Diagnosis Date  . ABSCESS, TOOTH 04/23/2009  . AKI (acute kidney injury) ([place]) 08/2015  . CHF (congestive heart failure) ([place])   . DIABETES MELLITUS, TYPE I 07/30/2007  . Esophageal reflux 04/27/2009  . FATIGUE 04/27/2009  . HYPERLIPIDEMIA 04/24/2007  . HYPERTENSION 04/24/2007  . INSOMNIA-SLEEP DISORDER-UNSPEC 04/27/2009  . LUMBAR STRAIN, ACUTE 12/11/2008  . RASH-NONVESICULAR 03/11/2008  . Shortness of breath    "lying down and w/exertion right now" (12/22/2012)    Patient Active Problem List   Diagnosis Date Noted  . Abdominal pain 03/26/2018  . Nausea 03/26/2018  . Diarrhea 03/26/2018  . Acute renal failure superimposed on stage 5 chronic kidney disease, not on chronic dialysis ([place]) 10/20/2017  . Hyperkalemia 10/20/2017  . CKD stage 5 due to type 1 diabetes mellitus ([place]) 09/27/2017  . Low back pain 03/29/2017  . Nocturnal leg cramps 04/03/2016  . Hypertensive urgency 09/02/2015  . Acute on chronic diastolic heart failure ([place]) 09/02/2015  .  Patient nonadherence 09/02/2015  . Dyspnea 12/22/2012  . Insulin dependent diabetes mellitus with complications ([place]) 25/36/6440  . Preventative health care 04/01/2011  . Esophageal reflux 04/27/2009  . INSOMNIA-SLEEP DISORDER-UNSPEC 04/27/2009  . Type 1 diabetes mellitus ([place]) 07/30/2007  . Dyslipidemia 04/24/2007  . Essential hypertension 04/24/2007    Past Surgical History:  Procedure Laterality Date  . IR FLUORO GUIDE CV LINE RIGHT  10/22/2017  . IR US GUIDE VASC ACCESS RIGHT  10/22/2017  . MOUTH SURGERY          Home Medications    Prior to Admission medications   Medication Sig Start Date End Date Taking? Authorizing Provider  amLODipine (NORVASC) 5 MG tablet Take 1 tablet (5 mg total) by mouth daily. 09/05/17   Marrian Salvage, FNP  aspirin EC 81 MG EC tablet Take 1 tablet (81 mg total) by mouth daily. 09/06/15   Nita Sells, MD  calcitRIOL (ROCALTROL) 0.25 MCG capsule Take 1 capsule (0.25 mcg total) by mouth every Monday, Wednesday, and Friday at 6 PM. 09/30/17   Thurnell Lose, MD  calcium acetate (PHOSLO) 667 MG capsule Take 2 capsules (1,334 mg total) by mouth 3 (three) times daily with meals. Patient taking differently: Take 1,334 mg by mouth 3 (three) times daily with meals.  10/25/17   Rai, Vernelle Emerald, MD  cinacalcet (SENSIPAR) 60 MG tablet Take 60 mg by mouth daily.    [provider]  cyclobenzaprine (FLEXERIL) 10 MG tablet Take 1 tablet (10  mg total) by mouth at bedtime as needed for muscle spasms. 10/07/17   Biagio Borg, MD  doxycycline (VIBRA-TABS) 100 MG tablet Take 1 tablet (100 mg total) by mouth 2 (two) times daily. 06/12/18   Delano Metz, FNP  ferrous sulfate 325 (65 FE) MG tablet Take 1 tablet (325 mg total) by mouth 2 (two) times daily with a meal. 09/28/17   Thurnell Lose, MD  insulin aspart (NOVOLOG) 100 UNIT/ML injection Inject 8-12 Units into the skin 3 (three) times daily with meals. 05/13/18   Biagio Borg, MD  insulin  glargine (LANTUS) 100 UNIT/ML injection Inject 0.25 mLs (25 Units total) into the skin at bedtime. 10/25/17   Rai, Ripudeep Raliegh Ip, MD  isosorbide mononitrate (IMDUR) 30 MG 24 hr tablet TAKE ONE-HALF TABLET BY MOUTH ONCE DAILY Patient taking differently: TAKE 15 mg  TABLET BY MOUTH ONCE DAILY 10/12/16   Biagio Borg, MD  Menthol-Methyl Salicylate (MUSCLE RUB) 10-15 % CREA Apply 1 application topically as needed for muscle pain (for leg cramps). 10/25/17   Rai, Vernelle Emerald, MD  omeprazole (PRILOSEC) 20 MG capsule Take 1 capsule (20 mg total) by mouth daily. 07/20/18   Horton, Barbette Hair, MD  polyethylene glycol (MIRALAX / GLYCOLAX) packet Take 17 g by mouth daily as needed for moderate constipation (over the counter). 10/25/17   Rai, Vernelle Emerald, MD  rOPINIRole (REQUIP) 0.25 MG tablet TAKE 1 TABLET BY MOUTH AT BEDTIME 10/21/17   Biagio Borg, MD  rosuvastatin (CRESTOR) 10 MG tablet Take 1 tablet (10 mg total) by mouth daily. 10/25/17   Rai, Vernelle Emerald, MD  traMADol (ULTRAM) 50 MG tablet Take 1 tablet (50 mg total) by mouth every 8 (eight) hours as needed for moderate pain. 10/25/17   Mendel Corning, MD    Family History Family History  Problem Relation Age of Onset  . Hypertension Mother   . Diabetes Mother   . Diabetes Maternal Aunt   . Kidney disease Neg Hx     Social History Social History   Tobacco Use  . Smoking status: Former Smoker    Types: Cigarettes    Last attempt to quit: 05/03/2015    Years since quitting: 3.2  . Smokeless tobacco: Never Used  . Tobacco comment: 12/22/2012 "used to smoke cigarettes once or twice/month; haven't had any cigarettes for years"  Substance Use Topics  . Alcohol use: Yes    Comment: occ  . Drug use: No     Allergies   Patient has no known allergies.   Review of Systems Review of Systems  Constitutional: Negative for fever.  Respiratory: Negative for shortness of breath.   Cardiovascular: Negative for chest pain.  Gastrointestinal: Positive for  abdominal pain and nausea. Negative for constipation and diarrhea.  Genitourinary: Negative for dysuria.  All other systems reviewed and are negative.    Physical Exam Updated Vital Signs BP (!) 142/86   Pulse 85   Temp 98.1 F (36.7 C) (Oral)   Resp 18   SpO2 100%   Physical Exam  Constitutional: He is oriented to person, place, and time. He appears well-developed and well-nourished.  Overweight, nontoxic-appearing  HENT:  Head: Normocephalic and atraumatic.  Neck: Neck supple.  Cardiovascular: Normal rate, regular rhythm and normal heart sounds.  No murmur heard. Pulmonary/Chest: Effort normal and breath sounds normal. No respiratory distress. He has no wheezes.  Abdominal: Soft. Bowel sounds are normal. There is no tenderness. There is no rebound.  Peritoneal catheter  site left abdomen, clean dry and intact, no tenderness to palpation, no signs of peritonitis  Genitourinary: Right testis shows no swelling. Left testis shows no swelling.  Musculoskeletal: He exhibits no edema.  Lymphadenopathy:    He has no cervical adenopathy.  Neurological: He is alert and oriented to person, place, and time.  Skin: Skin is warm and dry.  Psychiatric: He has a normal mood and affect.  Nursing note and vitals reviewed.    ED Treatments / Results  Labs (all labs ordered are listed, but only abnormal results are displayed) Labs Reviewed  COMPREHENSIVE METABOLIC PANEL - Abnormal; Notable for the following components:      Result Value   Glucose, Bld 130 (*)    BUN 69 (*)    Creatinine, Ser 11.96 (*)    Calcium 7.1 (*)    Total Protein 6.4 (*)    Albumin 3.0 (*)    GFR calc non Af Amer 5 (*)    GFR calc Af Amer 5 (*)    All other components within normal limits  CBC - Abnormal; Notable for the following components:   WBC 11.2 (*)    RBC 4.07 (*)    Hemoglobin 10.9 (*)    HCT 34.3 (*)    All other components within normal limits  URINALYSIS, ROUTINE W REFLEX MICROSCOPIC -  Abnormal; Notable for the following components:   Glucose, UA 50 (*)    Hgb urine dipstick SMALL (*)    Protein, ur 100 (*)    Bacteria, UA RARE (*)    All other components within normal limits  LIPASE, BLOOD    EKG None  Radiology No results found.  Procedures Procedures (including critical care time)  Medications Ordered in ED Medications  alum & mag hydroxide-simeth (MAALOX/MYLANTA) 200-200-20 MG/5ML suspension 30 mL (30 mLs Oral Given 07/20/18 0134)  famotidine (PEPCID) tablet 20 mg (20 mg Oral Given 07/20/18 0137)     Initial Impression / Assessment and Plan / ED Course  I have reviewed the triage vital signs and the nursing notes.  Pertinent labs & imaging results that were available during my care of the patient were reviewed by me and considered in my medical decision making (see chart for details).     Patient presents with upper abdominal discomfort.  Ongoing for the last several weeks.  Worse at night.  He is overall nontoxic-appearing and vital signs are reassuring.  He is afebrile.  His abdominal exam is fairly benign.  He is on peritoneal dialysis.  He denies any fevers or change in dialysate.  Given duration of symptoms and benign exam, would have low suspicion for SBP at this time.  Symptoms are highly suggestive of gastritis versus GERD.  Patient reports a history of the same.  Lab work obtained.  Patient was given a GI cocktail and Pepcid.  Lab work appears to be at patient's baseline.  On recheck, he states he feels much better and is not having any discomfort.  His repeat abdominal exam is benign.  At this time do not feel strongly that he needs to have his dialysate cultured.  We discussed treatment for gastritis.  He was given GI follow-up.  If he were to develop worsening pain or fever he would need to be reevaluated immediately.  Patient stated understanding.  After history, exam, and medical workup I feel the patient has been appropriately medically screened  and is safe for discharge home. Pertinent diagnoses were discussed with the patient. Patient  was given return precautions.   Final Clinical Impressions(s) / ED Diagnoses   Final diagnoses:  Epigastric pain    ED Discharge Orders         Ordered    omeprazole (PRILOSEC) 20 MG capsule  Daily     07/20/18 0224           Merryl Hacker, MD 07/20/18 [phone]

## 2018-07-20 NOTE — Discharge Instructions (Addendum)
You were seen today for upper abdominal pain.  This is likely related to gastroenteritis or gastritis.  Take medications as prescribed.  Avoid any foods that may exacerbate your symptoms.  Avoid alcohol and NSAIDs.  If you develop worsening pain, fevers, any new or worsening symptoms you need to be reevaluated immediately.

## 2018-07-20 NOTE — ED Triage Notes (Signed)
Pt c/o nausea/abdominal pain x weeks. Denies vomiting/diarrhea. Hx diabetes and peritoneal dialysis.

## 2018-07-21 DIAGNOSIS — N2581 Secondary hyperparathyroidism of renal origin: Secondary | ICD-10-CM | POA: Diagnosis not present

## 2018-07-21 DIAGNOSIS — N186 End stage renal disease: Secondary | ICD-10-CM | POA: Diagnosis not present

## 2018-07-22 DIAGNOSIS — N186 End stage renal disease: Secondary | ICD-10-CM | POA: Diagnosis not present

## 2018-07-22 DIAGNOSIS — N2581 Secondary hyperparathyroidism of renal origin: Secondary | ICD-10-CM | POA: Diagnosis not present

## 2018-07-23 DIAGNOSIS — E103413 Type 1 diabetes mellitus with severe nonproliferative diabetic retinopathy with macular edema, bilateral: Secondary | ICD-10-CM | POA: Diagnosis not present

## 2018-07-23 DIAGNOSIS — N2581 Secondary hyperparathyroidism of renal origin: Secondary | ICD-10-CM | POA: Diagnosis not present

## 2018-07-23 DIAGNOSIS — N186 End stage renal disease: Secondary | ICD-10-CM | POA: Diagnosis not present

## 2018-07-24 DIAGNOSIS — N2581 Secondary hyperparathyroidism of renal origin: Secondary | ICD-10-CM | POA: Diagnosis not present

## 2018-07-24 DIAGNOSIS — N186 End stage renal disease: Secondary | ICD-10-CM | POA: Diagnosis not present

## 2018-07-25 DIAGNOSIS — N186 End stage renal disease: Secondary | ICD-10-CM | POA: Diagnosis not present

## 2018-07-25 DIAGNOSIS — N2581 Secondary hyperparathyroidism of renal origin: Secondary | ICD-10-CM | POA: Diagnosis not present

## 2018-07-26 DIAGNOSIS — N186 End stage renal disease: Secondary | ICD-10-CM | POA: Diagnosis not present

## 2018-07-26 DIAGNOSIS — N2581 Secondary hyperparathyroidism of renal origin: Secondary | ICD-10-CM | POA: Diagnosis not present

## 2018-07-27 DIAGNOSIS — N2581 Secondary hyperparathyroidism of renal origin: Secondary | ICD-10-CM | POA: Diagnosis not present

## 2018-07-27 DIAGNOSIS — N186 End stage renal disease: Secondary | ICD-10-CM | POA: Diagnosis not present

## 2018-07-28 DIAGNOSIS — N2581 Secondary hyperparathyroidism of renal origin: Secondary | ICD-10-CM | POA: Diagnosis not present

## 2018-07-28 DIAGNOSIS — N186 End stage renal disease: Secondary | ICD-10-CM | POA: Diagnosis not present

## 2018-07-29 DIAGNOSIS — N186 End stage renal disease: Secondary | ICD-10-CM | POA: Diagnosis not present

## 2018-07-29 DIAGNOSIS — N2581 Secondary hyperparathyroidism of renal origin: Secondary | ICD-10-CM | POA: Diagnosis not present

## 2018-07-29 DIAGNOSIS — E119 Type 2 diabetes mellitus without complications: Secondary | ICD-10-CM | POA: Diagnosis not present

## 2018-07-30 DIAGNOSIS — N2581 Secondary hyperparathyroidism of renal origin: Secondary | ICD-10-CM | POA: Diagnosis not present

## 2018-07-30 DIAGNOSIS — N186 End stage renal disease: Secondary | ICD-10-CM | POA: Diagnosis not present

## 2018-07-31 DIAGNOSIS — N2581 Secondary hyperparathyroidism of renal origin: Secondary | ICD-10-CM | POA: Diagnosis not present

## 2018-07-31 DIAGNOSIS — N186 End stage renal disease: Secondary | ICD-10-CM | POA: Diagnosis not present

## 2018-08-01 DIAGNOSIS — N186 End stage renal disease: Secondary | ICD-10-CM | POA: Diagnosis not present

## 2018-08-01 DIAGNOSIS — N2581 Secondary hyperparathyroidism of renal origin: Secondary | ICD-10-CM | POA: Diagnosis not present

## 2018-08-02 DIAGNOSIS — N186 End stage renal disease: Secondary | ICD-10-CM | POA: Diagnosis not present

## 2018-08-02 DIAGNOSIS — N2581 Secondary hyperparathyroidism of renal origin: Secondary | ICD-10-CM | POA: Diagnosis not present

## 2018-08-03 DIAGNOSIS — N2581 Secondary hyperparathyroidism of renal origin: Secondary | ICD-10-CM | POA: Diagnosis not present

## 2018-08-03 DIAGNOSIS — N186 End stage renal disease: Secondary | ICD-10-CM | POA: Diagnosis not present

## 2018-08-04 DIAGNOSIS — N186 End stage renal disease: Secondary | ICD-10-CM | POA: Diagnosis not present

## 2018-08-04 DIAGNOSIS — N2581 Secondary hyperparathyroidism of renal origin: Secondary | ICD-10-CM | POA: Diagnosis not present

## 2018-08-05 DIAGNOSIS — N2581 Secondary hyperparathyroidism of renal origin: Secondary | ICD-10-CM | POA: Diagnosis not present

## 2018-08-05 DIAGNOSIS — N186 End stage renal disease: Secondary | ICD-10-CM | POA: Diagnosis not present

## 2018-08-06 DIAGNOSIS — N186 End stage renal disease: Secondary | ICD-10-CM | POA: Diagnosis not present

## 2018-08-06 DIAGNOSIS — N2581 Secondary hyperparathyroidism of renal origin: Secondary | ICD-10-CM | POA: Diagnosis not present

## 2018-08-07 DIAGNOSIS — N2581 Secondary hyperparathyroidism of renal origin: Secondary | ICD-10-CM | POA: Diagnosis not present

## 2018-08-07 DIAGNOSIS — N186 End stage renal disease: Secondary | ICD-10-CM | POA: Diagnosis not present

## 2018-08-08 DIAGNOSIS — N2581 Secondary hyperparathyroidism of renal origin: Secondary | ICD-10-CM | POA: Diagnosis not present

## 2018-08-08 DIAGNOSIS — N186 End stage renal disease: Secondary | ICD-10-CM | POA: Diagnosis not present

## 2018-08-09 DIAGNOSIS — N186 End stage renal disease: Secondary | ICD-10-CM | POA: Diagnosis not present

## 2018-08-09 DIAGNOSIS — N2581 Secondary hyperparathyroidism of renal origin: Secondary | ICD-10-CM | POA: Diagnosis not present

## 2018-08-10 DIAGNOSIS — N186 End stage renal disease: Secondary | ICD-10-CM | POA: Diagnosis not present

## 2018-08-10 DIAGNOSIS — N2581 Secondary hyperparathyroidism of renal origin: Secondary | ICD-10-CM | POA: Diagnosis not present

## 2018-08-11 DIAGNOSIS — N186 End stage renal disease: Secondary | ICD-10-CM | POA: Diagnosis not present

## 2018-08-11 DIAGNOSIS — N2581 Secondary hyperparathyroidism of renal origin: Secondary | ICD-10-CM | POA: Diagnosis not present

## 2018-08-12 DIAGNOSIS — N186 End stage renal disease: Secondary | ICD-10-CM | POA: Diagnosis not present

## 2018-08-12 DIAGNOSIS — E1022 Type 1 diabetes mellitus with diabetic chronic kidney disease: Secondary | ICD-10-CM | POA: Diagnosis not present

## 2018-08-12 DIAGNOSIS — N2581 Secondary hyperparathyroidism of renal origin: Secondary | ICD-10-CM | POA: Diagnosis not present

## 2018-08-12 DIAGNOSIS — Z992 Dependence on renal dialysis: Secondary | ICD-10-CM | POA: Diagnosis not present

## 2018-08-13 DIAGNOSIS — N186 End stage renal disease: Secondary | ICD-10-CM | POA: Diagnosis not present

## 2018-08-13 DIAGNOSIS — N2581 Secondary hyperparathyroidism of renal origin: Secondary | ICD-10-CM | POA: Diagnosis not present

## 2018-08-14 DIAGNOSIS — N186 End stage renal disease: Secondary | ICD-10-CM | POA: Diagnosis not present

## 2018-08-14 DIAGNOSIS — N2581 Secondary hyperparathyroidism of renal origin: Secondary | ICD-10-CM | POA: Diagnosis not present

## 2018-08-15 DIAGNOSIS — N186 End stage renal disease: Secondary | ICD-10-CM | POA: Diagnosis not present

## 2018-08-15 DIAGNOSIS — N2581 Secondary hyperparathyroidism of renal origin: Secondary | ICD-10-CM | POA: Diagnosis not present

## 2018-08-16 DIAGNOSIS — N2581 Secondary hyperparathyroidism of renal origin: Secondary | ICD-10-CM | POA: Diagnosis not present

## 2018-08-16 DIAGNOSIS — N186 End stage renal disease: Secondary | ICD-10-CM | POA: Diagnosis not present

## 2018-08-17 DIAGNOSIS — N2581 Secondary hyperparathyroidism of renal origin: Secondary | ICD-10-CM | POA: Diagnosis not present

## 2018-08-17 DIAGNOSIS — N186 End stage renal disease: Secondary | ICD-10-CM | POA: Diagnosis not present

## 2018-08-18 DIAGNOSIS — N186 End stage renal disease: Secondary | ICD-10-CM | POA: Diagnosis not present

## 2018-08-18 DIAGNOSIS — N2581 Secondary hyperparathyroidism of renal origin: Secondary | ICD-10-CM | POA: Diagnosis not present

## 2018-08-19 DIAGNOSIS — N2581 Secondary hyperparathyroidism of renal origin: Secondary | ICD-10-CM | POA: Diagnosis not present

## 2018-08-19 DIAGNOSIS — N186 End stage renal disease: Secondary | ICD-10-CM | POA: Diagnosis not present

## 2018-08-20 DIAGNOSIS — N2581 Secondary hyperparathyroidism of renal origin: Secondary | ICD-10-CM | POA: Diagnosis not present

## 2018-08-20 DIAGNOSIS — N186 End stage renal disease: Secondary | ICD-10-CM | POA: Diagnosis not present

## 2018-08-21 DIAGNOSIS — N2581 Secondary hyperparathyroidism of renal origin: Secondary | ICD-10-CM | POA: Diagnosis not present

## 2018-08-21 DIAGNOSIS — N186 End stage renal disease: Secondary | ICD-10-CM | POA: Diagnosis not present

## 2018-08-22 DIAGNOSIS — N186 End stage renal disease: Secondary | ICD-10-CM | POA: Diagnosis not present

## 2018-08-22 DIAGNOSIS — N2581 Secondary hyperparathyroidism of renal origin: Secondary | ICD-10-CM | POA: Diagnosis not present

## 2018-08-23 DIAGNOSIS — N2581 Secondary hyperparathyroidism of renal origin: Secondary | ICD-10-CM | POA: Diagnosis not present

## 2018-08-23 DIAGNOSIS — N186 End stage renal disease: Secondary | ICD-10-CM | POA: Diagnosis not present

## 2018-08-24 DIAGNOSIS — N2581 Secondary hyperparathyroidism of renal origin: Secondary | ICD-10-CM | POA: Diagnosis not present

## 2018-08-24 DIAGNOSIS — N186 End stage renal disease: Secondary | ICD-10-CM | POA: Diagnosis not present

## 2018-08-25 DIAGNOSIS — N2581 Secondary hyperparathyroidism of renal origin: Secondary | ICD-10-CM | POA: Diagnosis not present

## 2018-08-25 DIAGNOSIS — N186 End stage renal disease: Secondary | ICD-10-CM | POA: Diagnosis not present

## 2018-08-26 DIAGNOSIS — N2581 Secondary hyperparathyroidism of renal origin: Secondary | ICD-10-CM | POA: Diagnosis not present

## 2018-08-26 DIAGNOSIS — N186 End stage renal disease: Secondary | ICD-10-CM | POA: Diagnosis not present

## 2018-08-27 DIAGNOSIS — N186 End stage renal disease: Secondary | ICD-10-CM | POA: Diagnosis not present

## 2018-08-27 DIAGNOSIS — N2581 Secondary hyperparathyroidism of renal origin: Secondary | ICD-10-CM | POA: Diagnosis not present

## 2018-08-28 DIAGNOSIS — H35033 Hypertensive retinopathy, bilateral: Secondary | ICD-10-CM | POA: Diagnosis not present

## 2018-08-28 DIAGNOSIS — N186 End stage renal disease: Secondary | ICD-10-CM | POA: Diagnosis not present

## 2018-08-28 DIAGNOSIS — E103513 Type 1 diabetes mellitus with proliferative diabetic retinopathy with macular edema, bilateral: Secondary | ICD-10-CM | POA: Diagnosis not present

## 2018-08-28 DIAGNOSIS — H3582 Retinal ischemia: Secondary | ICD-10-CM | POA: Diagnosis not present

## 2018-08-28 DIAGNOSIS — N2581 Secondary hyperparathyroidism of renal origin: Secondary | ICD-10-CM | POA: Diagnosis not present

## 2018-08-29 DIAGNOSIS — N186 End stage renal disease: Secondary | ICD-10-CM | POA: Diagnosis not present

## 2018-08-29 DIAGNOSIS — N2581 Secondary hyperparathyroidism of renal origin: Secondary | ICD-10-CM | POA: Diagnosis not present

## 2018-08-30 DIAGNOSIS — N186 End stage renal disease: Secondary | ICD-10-CM | POA: Diagnosis not present

## 2018-08-30 DIAGNOSIS — N2581 Secondary hyperparathyroidism of renal origin: Secondary | ICD-10-CM | POA: Diagnosis not present

## 2018-08-31 DIAGNOSIS — N186 End stage renal disease: Secondary | ICD-10-CM | POA: Diagnosis not present

## 2018-08-31 DIAGNOSIS — N2581 Secondary hyperparathyroidism of renal origin: Secondary | ICD-10-CM | POA: Diagnosis not present

## 2018-09-01 DIAGNOSIS — N2581 Secondary hyperparathyroidism of renal origin: Secondary | ICD-10-CM | POA: Diagnosis not present

## 2018-09-01 DIAGNOSIS — N186 End stage renal disease: Secondary | ICD-10-CM | POA: Diagnosis not present

## 2018-09-02 DIAGNOSIS — N2581 Secondary hyperparathyroidism of renal origin: Secondary | ICD-10-CM | POA: Diagnosis not present

## 2018-09-02 DIAGNOSIS — N186 End stage renal disease: Secondary | ICD-10-CM | POA: Diagnosis not present

## 2018-09-03 ENCOUNTER — Encounter: Payer: Self-pay | Admitting: Gastroenterology

## 2018-09-03 DIAGNOSIS — N186 End stage renal disease: Secondary | ICD-10-CM | POA: Diagnosis not present

## 2018-09-03 DIAGNOSIS — N2581 Secondary hyperparathyroidism of renal origin: Secondary | ICD-10-CM | POA: Diagnosis not present

## 2018-09-04 DIAGNOSIS — N186 End stage renal disease: Secondary | ICD-10-CM | POA: Diagnosis not present

## 2018-09-04 DIAGNOSIS — N2581 Secondary hyperparathyroidism of renal origin: Secondary | ICD-10-CM | POA: Diagnosis not present

## 2018-09-04 DIAGNOSIS — E103513 Type 1 diabetes mellitus with proliferative diabetic retinopathy with macular edema, bilateral: Secondary | ICD-10-CM | POA: Diagnosis not present

## 2018-09-05 DIAGNOSIS — N2581 Secondary hyperparathyroidism of renal origin: Secondary | ICD-10-CM | POA: Diagnosis not present

## 2018-09-05 DIAGNOSIS — N186 End stage renal disease: Secondary | ICD-10-CM | POA: Diagnosis not present

## 2018-09-06 DIAGNOSIS — N186 End stage renal disease: Secondary | ICD-10-CM | POA: Diagnosis not present

## 2018-09-06 DIAGNOSIS — N2581 Secondary hyperparathyroidism of renal origin: Secondary | ICD-10-CM | POA: Diagnosis not present

## 2018-09-07 DIAGNOSIS — N2581 Secondary hyperparathyroidism of renal origin: Secondary | ICD-10-CM | POA: Diagnosis not present

## 2018-09-07 DIAGNOSIS — N186 End stage renal disease: Secondary | ICD-10-CM | POA: Diagnosis not present

## 2018-09-08 DIAGNOSIS — N186 End stage renal disease: Secondary | ICD-10-CM | POA: Diagnosis not present

## 2018-09-08 DIAGNOSIS — N2581 Secondary hyperparathyroidism of renal origin: Secondary | ICD-10-CM | POA: Diagnosis not present

## 2018-09-09 DIAGNOSIS — N186 End stage renal disease: Secondary | ICD-10-CM | POA: Diagnosis not present

## 2018-09-09 DIAGNOSIS — N2581 Secondary hyperparathyroidism of renal origin: Secondary | ICD-10-CM | POA: Diagnosis not present

## 2018-09-10 DIAGNOSIS — N186 End stage renal disease: Secondary | ICD-10-CM | POA: Diagnosis not present

## 2018-09-10 DIAGNOSIS — N2581 Secondary hyperparathyroidism of renal origin: Secondary | ICD-10-CM | POA: Diagnosis not present

## 2018-09-11 DIAGNOSIS — N186 End stage renal disease: Secondary | ICD-10-CM | POA: Diagnosis not present

## 2018-09-11 DIAGNOSIS — N2581 Secondary hyperparathyroidism of renal origin: Secondary | ICD-10-CM | POA: Diagnosis not present

## 2018-09-12 DIAGNOSIS — N2581 Secondary hyperparathyroidism of renal origin: Secondary | ICD-10-CM | POA: Diagnosis not present

## 2018-09-12 DIAGNOSIS — N186 End stage renal disease: Secondary | ICD-10-CM | POA: Diagnosis not present

## 2018-09-12 DIAGNOSIS — Z992 Dependence on renal dialysis: Secondary | ICD-10-CM | POA: Diagnosis not present

## 2018-09-12 DIAGNOSIS — E1022 Type 1 diabetes mellitus with diabetic chronic kidney disease: Secondary | ICD-10-CM | POA: Diagnosis not present

## 2018-09-13 DIAGNOSIS — N186 End stage renal disease: Secondary | ICD-10-CM | POA: Diagnosis not present

## 2018-09-13 DIAGNOSIS — N2581 Secondary hyperparathyroidism of renal origin: Secondary | ICD-10-CM | POA: Diagnosis not present

## 2018-09-14 DIAGNOSIS — N186 End stage renal disease: Secondary | ICD-10-CM | POA: Diagnosis not present

## 2018-09-14 DIAGNOSIS — N2581 Secondary hyperparathyroidism of renal origin: Secondary | ICD-10-CM | POA: Diagnosis not present

## 2018-09-15 DIAGNOSIS — N2581 Secondary hyperparathyroidism of renal origin: Secondary | ICD-10-CM | POA: Diagnosis not present

## 2018-09-15 DIAGNOSIS — N186 End stage renal disease: Secondary | ICD-10-CM | POA: Diagnosis not present

## 2018-09-16 DIAGNOSIS — N186 End stage renal disease: Secondary | ICD-10-CM | POA: Diagnosis not present

## 2018-09-16 DIAGNOSIS — Z7682 Awaiting organ transplant status: Secondary | ICD-10-CM | POA: Diagnosis not present

## 2018-09-16 DIAGNOSIS — N2581 Secondary hyperparathyroidism of renal origin: Secondary | ICD-10-CM | POA: Diagnosis not present

## 2018-09-17 DIAGNOSIS — N186 End stage renal disease: Secondary | ICD-10-CM | POA: Diagnosis not present

## 2018-09-17 DIAGNOSIS — N2581 Secondary hyperparathyroidism of renal origin: Secondary | ICD-10-CM | POA: Diagnosis not present

## 2018-09-18 DIAGNOSIS — N186 End stage renal disease: Secondary | ICD-10-CM | POA: Diagnosis not present

## 2018-09-18 DIAGNOSIS — N2581 Secondary hyperparathyroidism of renal origin: Secondary | ICD-10-CM | POA: Diagnosis not present

## 2018-09-19 DIAGNOSIS — N186 End stage renal disease: Secondary | ICD-10-CM | POA: Diagnosis not present

## 2018-09-19 DIAGNOSIS — N2581 Secondary hyperparathyroidism of renal origin: Secondary | ICD-10-CM | POA: Diagnosis not present

## 2018-09-20 DIAGNOSIS — N186 End stage renal disease: Secondary | ICD-10-CM | POA: Diagnosis not present

## 2018-09-20 DIAGNOSIS — N2581 Secondary hyperparathyroidism of renal origin: Secondary | ICD-10-CM | POA: Diagnosis not present

## 2018-09-21 DIAGNOSIS — N186 End stage renal disease: Secondary | ICD-10-CM | POA: Diagnosis not present

## 2018-09-21 DIAGNOSIS — N2581 Secondary hyperparathyroidism of renal origin: Secondary | ICD-10-CM | POA: Diagnosis not present

## 2018-09-22 DIAGNOSIS — N186 End stage renal disease: Secondary | ICD-10-CM | POA: Diagnosis not present

## 2018-09-22 DIAGNOSIS — N2581 Secondary hyperparathyroidism of renal origin: Secondary | ICD-10-CM | POA: Diagnosis not present

## 2018-09-23 ENCOUNTER — Encounter: Payer: Self-pay | Admitting: Gastroenterology

## 2018-09-23 ENCOUNTER — Encounter (INDEPENDENT_AMBULATORY_CARE_PROVIDER_SITE_OTHER): Payer: Self-pay

## 2018-09-23 ENCOUNTER — Ambulatory Visit: Payer: Federal, State, Local not specified - PPO | Admitting: Gastroenterology

## 2018-09-23 VITALS — BP 156/86 | HR 112 | Ht 66.0 in | Wt 222.0 lb

## 2018-09-23 DIAGNOSIS — K219 Gastro-esophageal reflux disease without esophagitis: Secondary | ICD-10-CM

## 2018-09-23 DIAGNOSIS — D509 Iron deficiency anemia, unspecified: Secondary | ICD-10-CM

## 2018-09-23 DIAGNOSIS — N2581 Secondary hyperparathyroidism of renal origin: Secondary | ICD-10-CM | POA: Diagnosis not present

## 2018-09-23 DIAGNOSIS — R1013 Epigastric pain: Secondary | ICD-10-CM | POA: Diagnosis not present

## 2018-09-23 DIAGNOSIS — R140 Abdominal distension (gaseous): Secondary | ICD-10-CM

## 2018-09-23 DIAGNOSIS — N186 End stage renal disease: Secondary | ICD-10-CM | POA: Diagnosis not present

## 2018-09-23 DIAGNOSIS — R110 Nausea: Secondary | ICD-10-CM | POA: Diagnosis not present

## 2018-09-23 NOTE — Patient Instructions (Signed)
You can take over the counter Gas-X four times a day as needed for gas and bloating.   Patient advised to avoid spicy, acidic, citrus, chocolate, mints, fruit and fruit juices.  Limit the intake of caffeine, alcohol and Soda.  Don't exercise too soon after eating.  Don't lie down within 3-4 hours of eating.  Elevate the head of your bed.  You have been scheduled for an endoscopy. Please follow written instructions given to you at your visit today. If you use inhalers (even only as needed), please bring them with you on the day of your procedure. Your physician has requested that you go to www.startemmi.com and enter the access code given to you at your visit today. This web site gives a general overview about your procedure. However, you should still follow specific instructions given to you by our office regarding your preparation for the procedure.  Thank you for choosing me and [facility].  Pricilla Riffle. Dagoberto Ligas., MD., [facility]

## 2018-09-23 NOTE — Progress Notes (Signed)
History of Present Illness: This is a 48 year old male referred by Hector Heinz, MD for the evaluation of epigastric pain, nausea, bloating, gas, belching, flatulence, GERD, iron deficiency anemia.  He frequently notes that epigastric pain, nausea and bloating worsen after recumbency for peritoneal dialysis.  His symptoms have improved, but have not resolved, on omeprazole 20 mg twice daily.  He takes Alka-Seltzer and Tums as needed which provide some relief.  Blood work from March 2019 shows hemoglobin of 8.3, MCV 72, iron saturation 13%, TIBC normal. He denies weight loss, constipation, diarrhea, change in stool caliber, melena, hematochezia, vomiting, dysphagia, chest pain.   No Known Allergies Outpatient Medications Prior to Visit  Medication Sig Dispense Refill  . amLODipine (NORVASC) 5 MG tablet Take 1 tablet (5 mg total) by mouth daily. 30 tablet 1  . aspirin EC 81 MG EC tablet Take 1 tablet (81 mg total) by mouth daily. 30 tablet 0  . Aspirin Effervescent (ALKA-SELTZER PO) Take by mouth as needed.    . calcitRIOL (ROCALTROL) 0.25 MCG capsule Take 1 capsule (0.25 mcg total) by mouth every Monday, Wednesday, and Friday at 6 PM. 15 capsule 0  . calcium acetate (PHOSLO) 667 MG capsule Take 2 capsules (1,334 mg total) by mouth 3 (three) times daily with meals. (Patient taking differently: Take 1,334 mg by mouth 3 (three) times daily with meals. ) 180 capsule 3  . Calcium Carbonate Antacid (TUMS SMOOTHIES PO) Take by mouth as needed.    . cinacalcet (SENSIPAR) 60 MG tablet Take 60 mg by mouth daily.    . cyclobenzaprine (FLEXERIL) 10 MG tablet Take 1 tablet (10 mg total) by mouth at bedtime as needed for muscle spasms. 90 tablet 01  . doxycycline (VIBRA-TABS) 100 MG tablet Take 1 tablet (100 mg total) by mouth 2 (two) times daily. 20 tablet 0  . ferrous sulfate 325 (65 FE) MG tablet Take 1 tablet (325 mg total) by mouth 2 (two) times daily with a meal. 60 tablet 0  . insulin aspart  (NOVOLOG) 100 UNIT/ML injection Inject 8-12 Units into the skin 3 (three) times daily with meals. 2 vial 5  . insulin glargine (LANTUS) 100 UNIT/ML injection Inject 0.25 mLs (25 Units total) into the skin at bedtime. 20 mL 5  . isosorbide mononitrate (IMDUR) 30 MG 24 hr tablet TAKE ONE-HALF TABLET BY MOUTH ONCE DAILY (Patient taking differently: TAKE 15 mg  TABLET BY MOUTH ONCE DAILY) 90 tablet 1  . Menthol-Methyl Salicylate (MUSCLE RUB) 10-15 % CREA Apply 1 application topically as needed for muscle pain (for leg cramps). 35 g 1  . omeprazole (PRILOSEC) 20 MG capsule Take 1 capsule (20 mg total) by mouth daily. 30 capsule 0  . polyethylene glycol (MIRALAX / GLYCOLAX) packet Take 17 g by mouth daily as needed for moderate constipation (over the counter). 14 each 0  . rOPINIRole (REQUIP) 0.25 MG tablet TAKE 1 TABLET BY MOUTH AT BEDTIME 90 tablet 3  . rosuvastatin (CRESTOR) 10 MG tablet Take 1 tablet (10 mg total) by mouth daily. 30 tablet 3  . traMADol (ULTRAM) 50 MG tablet Take 1 tablet (50 mg total) by mouth every 8 (eight) hours as needed for moderate pain. 20 tablet 0   No facility-administered medications prior to visit.    Past Medical History:  Diagnosis Date  . ABSCESS, TOOTH 04/23/2009  . AKI (acute kidney injury) ([place]) 08/2015  . CHF (congestive heart failure) ([place])   . Chronic kidney disease   . DIABETES  MELLITUS, TYPE I 07/30/2007  . Esophageal reflux 04/27/2009  . FATIGUE 04/27/2009  . HYPERLIPIDEMIA 04/24/2007  . HYPERTENSION 04/24/2007  . INSOMNIA-SLEEP DISORDER-UNSPEC 04/27/2009  . LUMBAR STRAIN, ACUTE 12/11/2008  . RASH-NONVESICULAR 03/11/2008  . Shortness of breath    "lying down and w/exertion right now" (12/22/2012)   Past Surgical History:  Procedure Laterality Date  . IR FLUORO GUIDE CV LINE RIGHT  10/22/2017  . IR US GUIDE VASC ACCESS RIGHT  10/22/2017  . MOUTH SURGERY     Social History   Socioeconomic History  . Marital status: Married    Spouse name: Not on file  .  Number of children: 0  . Years of education: Not on file  . Highest education level: Not on file  Occupational History  . Not on file  Social Needs  . Financial resource strain: Not on file  . Food insecurity:    Worry: Not on file    Inability: Not on file  . Transportation needs:    Medical: Not on file    Non-medical: Not on file  Tobacco Use  . Smoking status: Former Smoker    Types: Cigarettes    Last attempt to quit: 05/03/2015    Years since quitting: 3.3  . Smokeless tobacco: Never Used  . Tobacco comment: 12/22/2012 "used to smoke cigarettes once or twice/month; haven't had any cigarettes for years"  Substance and Sexual Activity  . Alcohol use: Yes    Comment: occ  . Drug use: No  . Sexual activity: Yes  Lifestyle  . Physical activity:    Days per week: Not on file    Minutes per session: Not on file  . Stress: Not on file  Relationships  . Social connections:    Talks on phone: Not on file    Gets together: Not on file    Attends religious service: Not on file    Active member of club or organization: Not on file    Attends meetings of clubs or organizations: Not on file    Relationship status: Not on file  Other Topics Concern  . Not on file  Social History Narrative  . Not on file   Family History  Problem Relation Age of Onset  . Hypertension Mother   . Diabetes Mother   . Diabetes Maternal Aunt   . Lung cancer Maternal Grandfather   . Colon polyps Maternal Uncle   . Colon cancer Maternal Uncle   . Heart disease Maternal Uncle   . Kidney disease Neg Hx        Review of Systems: Pertinent positive and negative review of systems were noted in the above HPI section. All other review of systems were otherwise negative.   Physical Exam: General: Well developed, well nourished, no acute distress Head: Normocephalic and atraumatic Eyes:  sclerae anicteric, EOMI Ears: Normal auditory acuity Mouth: No deformity or lesions Neck: Supple, no masses or  thyromegaly Lungs: Clear throughout to auscultation Heart: Regular rate and rhythm; no murmurs, rubs or bruits Abdomen: Soft, non tender and non distended. No masses, hepatosplenomegaly or hernias noted. Normal Bowel sounds Rectal: Not done Musculoskeletal: Symmetrical with no gross deformities  Skin: No lesions on visible extremities Pulses:  Normal pulses noted Extremities: No clubbing, cyanosis, edema or deformities noted Neurological: Alert oriented x 4, grossly nonfocal Cervical Nodes:  No significant cervical adenopathy Inguinal Nodes: No significant inguinal adenopathy Psychological:  Alert and cooperative. Normal mood and affect   Assessment and Recommendations:  1.  Epigastric pain, GERD, nausea, bloating, gas, iron deficiency anemia.  Rule out GERD, esophagitis, ulcer, gastritis, AVMs. Follow standard antireflux measures.  Elevate head of bed.  Elevate head during peritoneal dialysis as much as feasible.  Continue omeprazole 20 mg twice daily.  Low gas diet.  Gas-X 4 times daily as needed.  Follow-up CBC and other blood work per renal.  Schedule EGD. The risks (including bleeding, perforation, infection, missed lesions, medication reactions and possible hospitalization or surgery if complications occur), benefits, and alternatives to endoscopy with possible biopsy and possible dilation were discussed with the patient and they consent to proceed.  If no findings to explain anemia noted on EGD proceed with colonoscopy.  2.  Constipation.  Controlled on MiraLAX daily as needed.  3. ESRD on peritoneal dialysis.  4. DM.    cc: Hector Heinz, MD [address] [place], [place] [address]

## 2018-09-24 DIAGNOSIS — N186 End stage renal disease: Secondary | ICD-10-CM | POA: Diagnosis not present

## 2018-09-24 DIAGNOSIS — N2581 Secondary hyperparathyroidism of renal origin: Secondary | ICD-10-CM | POA: Diagnosis not present

## 2018-09-25 DIAGNOSIS — N186 End stage renal disease: Secondary | ICD-10-CM | POA: Diagnosis not present

## 2018-09-25 DIAGNOSIS — N2581 Secondary hyperparathyroidism of renal origin: Secondary | ICD-10-CM | POA: Diagnosis not present

## 2018-09-26 DIAGNOSIS — N2581 Secondary hyperparathyroidism of renal origin: Secondary | ICD-10-CM | POA: Diagnosis not present

## 2018-09-26 DIAGNOSIS — N186 End stage renal disease: Secondary | ICD-10-CM | POA: Diagnosis not present

## 2018-09-27 DIAGNOSIS — N2581 Secondary hyperparathyroidism of renal origin: Secondary | ICD-10-CM | POA: Diagnosis not present

## 2018-09-27 DIAGNOSIS — N186 End stage renal disease: Secondary | ICD-10-CM | POA: Diagnosis not present

## 2018-09-28 DIAGNOSIS — N186 End stage renal disease: Secondary | ICD-10-CM | POA: Diagnosis not present

## 2018-09-28 DIAGNOSIS — N2581 Secondary hyperparathyroidism of renal origin: Secondary | ICD-10-CM | POA: Diagnosis not present

## 2018-09-29 DIAGNOSIS — N186 End stage renal disease: Secondary | ICD-10-CM | POA: Diagnosis not present

## 2018-09-29 DIAGNOSIS — N2581 Secondary hyperparathyroidism of renal origin: Secondary | ICD-10-CM | POA: Diagnosis not present

## 2018-09-30 DIAGNOSIS — N2581 Secondary hyperparathyroidism of renal origin: Secondary | ICD-10-CM | POA: Diagnosis not present

## 2018-09-30 DIAGNOSIS — N186 End stage renal disease: Secondary | ICD-10-CM | POA: Diagnosis not present

## 2018-10-01 DIAGNOSIS — N2581 Secondary hyperparathyroidism of renal origin: Secondary | ICD-10-CM | POA: Diagnosis not present

## 2018-10-01 DIAGNOSIS — N186 End stage renal disease: Secondary | ICD-10-CM | POA: Diagnosis not present

## 2018-10-02 DIAGNOSIS — N186 End stage renal disease: Secondary | ICD-10-CM | POA: Diagnosis not present

## 2018-10-02 DIAGNOSIS — N2581 Secondary hyperparathyroidism of renal origin: Secondary | ICD-10-CM | POA: Diagnosis not present

## 2018-10-03 ENCOUNTER — Telehealth: Payer: Self-pay | Admitting: Gastroenterology

## 2018-10-03 DIAGNOSIS — N186 End stage renal disease: Secondary | ICD-10-CM | POA: Diagnosis not present

## 2018-10-03 DIAGNOSIS — N2581 Secondary hyperparathyroidism of renal origin: Secondary | ICD-10-CM | POA: Diagnosis not present

## 2018-10-03 NOTE — Telephone Encounter (Signed)
Pt stated that he continues to have stomach pain, he said that his PCP recommended to have an [place] to check his gallbladder. He wants to know Dr. Lynne Leader opinion on that. Pls call him.

## 2018-10-04 DIAGNOSIS — N2581 Secondary hyperparathyroidism of renal origin: Secondary | ICD-10-CM | POA: Diagnosis not present

## 2018-10-04 DIAGNOSIS — N186 End stage renal disease: Secondary | ICD-10-CM | POA: Diagnosis not present

## 2018-10-05 DIAGNOSIS — N186 End stage renal disease: Secondary | ICD-10-CM | POA: Diagnosis not present

## 2018-10-05 DIAGNOSIS — N2581 Secondary hyperparathyroidism of renal origin: Secondary | ICD-10-CM | POA: Diagnosis not present

## 2018-10-06 DIAGNOSIS — N2581 Secondary hyperparathyroidism of renal origin: Secondary | ICD-10-CM | POA: Diagnosis not present

## 2018-10-06 DIAGNOSIS — N186 End stage renal disease: Secondary | ICD-10-CM | POA: Diagnosis not present

## 2018-10-06 DIAGNOSIS — H2513 Age-related nuclear cataract, bilateral: Secondary | ICD-10-CM | POA: Diagnosis not present

## 2018-10-06 DIAGNOSIS — H35033 Hypertensive retinopathy, bilateral: Secondary | ICD-10-CM | POA: Diagnosis not present

## 2018-10-06 DIAGNOSIS — E103513 Type 1 diabetes mellitus with proliferative diabetic retinopathy with macular edema, bilateral: Secondary | ICD-10-CM | POA: Diagnosis not present

## 2018-10-06 DIAGNOSIS — H3582 Retinal ischemia: Secondary | ICD-10-CM | POA: Diagnosis not present

## 2018-10-06 NOTE — Telephone Encounter (Signed)
Patient is encouraged to keep his appt for EGD.  He can discuss [place] if the EGD is negative.  Patient agrees to plan.

## 2018-10-07 DIAGNOSIS — N186 End stage renal disease: Secondary | ICD-10-CM | POA: Diagnosis not present

## 2018-10-07 DIAGNOSIS — N2581 Secondary hyperparathyroidism of renal origin: Secondary | ICD-10-CM | POA: Diagnosis not present

## 2018-10-08 DIAGNOSIS — N2581 Secondary hyperparathyroidism of renal origin: Secondary | ICD-10-CM | POA: Diagnosis not present

## 2018-10-08 DIAGNOSIS — N186 End stage renal disease: Secondary | ICD-10-CM | POA: Diagnosis not present

## 2018-10-09 DIAGNOSIS — N186 End stage renal disease: Secondary | ICD-10-CM | POA: Diagnosis not present

## 2018-10-09 DIAGNOSIS — N2581 Secondary hyperparathyroidism of renal origin: Secondary | ICD-10-CM | POA: Diagnosis not present

## 2018-10-10 DIAGNOSIS — N186 End stage renal disease: Secondary | ICD-10-CM | POA: Diagnosis not present

## 2018-10-10 DIAGNOSIS — N2581 Secondary hyperparathyroidism of renal origin: Secondary | ICD-10-CM | POA: Diagnosis not present

## 2018-10-11 DIAGNOSIS — E1022 Type 1 diabetes mellitus with diabetic chronic kidney disease: Secondary | ICD-10-CM | POA: Diagnosis not present

## 2018-10-11 DIAGNOSIS — Z992 Dependence on renal dialysis: Secondary | ICD-10-CM | POA: Diagnosis not present

## 2018-10-11 DIAGNOSIS — N186 End stage renal disease: Secondary | ICD-10-CM | POA: Diagnosis not present

## 2018-10-11 DIAGNOSIS — N2581 Secondary hyperparathyroidism of renal origin: Secondary | ICD-10-CM | POA: Diagnosis not present

## 2018-10-12 DIAGNOSIS — N2581 Secondary hyperparathyroidism of renal origin: Secondary | ICD-10-CM | POA: Diagnosis not present

## 2018-10-12 DIAGNOSIS — N186 End stage renal disease: Secondary | ICD-10-CM | POA: Diagnosis not present

## 2018-10-13 DIAGNOSIS — N2581 Secondary hyperparathyroidism of renal origin: Secondary | ICD-10-CM | POA: Diagnosis not present

## 2018-10-13 DIAGNOSIS — N186 End stage renal disease: Secondary | ICD-10-CM | POA: Diagnosis not present

## 2018-10-14 DIAGNOSIS — N186 End stage renal disease: Secondary | ICD-10-CM | POA: Diagnosis not present

## 2018-10-14 DIAGNOSIS — N2581 Secondary hyperparathyroidism of renal origin: Secondary | ICD-10-CM | POA: Diagnosis not present

## 2018-10-15 DIAGNOSIS — N186 End stage renal disease: Secondary | ICD-10-CM | POA: Diagnosis not present

## 2018-10-15 DIAGNOSIS — N2581 Secondary hyperparathyroidism of renal origin: Secondary | ICD-10-CM | POA: Diagnosis not present

## 2018-10-16 DIAGNOSIS — N2581 Secondary hyperparathyroidism of renal origin: Secondary | ICD-10-CM | POA: Diagnosis not present

## 2018-10-16 DIAGNOSIS — N186 End stage renal disease: Secondary | ICD-10-CM | POA: Diagnosis not present

## 2018-10-17 ENCOUNTER — Telehealth: Payer: Self-pay | Admitting: Gastroenterology

## 2018-10-17 ENCOUNTER — Encounter: Payer: Federal, State, Local not specified - PPO | Admitting: Gastroenterology

## 2018-10-17 DIAGNOSIS — N2581 Secondary hyperparathyroidism of renal origin: Secondary | ICD-10-CM | POA: Diagnosis not present

## 2018-10-17 DIAGNOSIS — N186 End stage renal disease: Secondary | ICD-10-CM | POA: Diagnosis not present

## 2018-10-17 NOTE — Telephone Encounter (Signed)
Mr Hector Mahoney told the front desk receptionist when she called that he has the flu. He will not be in today. Too sick right now to reschedule so he will call back later.

## 2018-10-18 DIAGNOSIS — N186 End stage renal disease: Secondary | ICD-10-CM | POA: Diagnosis not present

## 2018-10-18 DIAGNOSIS — N2581 Secondary hyperparathyroidism of renal origin: Secondary | ICD-10-CM | POA: Diagnosis not present

## 2018-10-19 DIAGNOSIS — N186 End stage renal disease: Secondary | ICD-10-CM | POA: Diagnosis not present

## 2018-10-19 DIAGNOSIS — N2581 Secondary hyperparathyroidism of renal origin: Secondary | ICD-10-CM | POA: Diagnosis not present

## 2018-10-20 DIAGNOSIS — N186 End stage renal disease: Secondary | ICD-10-CM | POA: Diagnosis not present

## 2018-10-20 DIAGNOSIS — N2581 Secondary hyperparathyroidism of renal origin: Secondary | ICD-10-CM | POA: Diagnosis not present

## 2018-10-21 ENCOUNTER — Ambulatory Visit: Payer: Federal, State, Local not specified - PPO | Admitting: Internal Medicine

## 2018-10-21 DIAGNOSIS — N186 End stage renal disease: Secondary | ICD-10-CM | POA: Diagnosis not present

## 2018-10-21 DIAGNOSIS — N2581 Secondary hyperparathyroidism of renal origin: Secondary | ICD-10-CM | POA: Diagnosis not present

## 2018-10-22 DIAGNOSIS — N2581 Secondary hyperparathyroidism of renal origin: Secondary | ICD-10-CM | POA: Diagnosis not present

## 2018-10-22 DIAGNOSIS — N186 End stage renal disease: Secondary | ICD-10-CM | POA: Diagnosis not present

## 2018-10-23 DIAGNOSIS — N186 End stage renal disease: Secondary | ICD-10-CM | POA: Diagnosis not present

## 2018-10-23 DIAGNOSIS — N2581 Secondary hyperparathyroidism of renal origin: Secondary | ICD-10-CM | POA: Diagnosis not present

## 2018-10-24 DIAGNOSIS — N2581 Secondary hyperparathyroidism of renal origin: Secondary | ICD-10-CM | POA: Diagnosis not present

## 2018-10-24 DIAGNOSIS — N186 End stage renal disease: Secondary | ICD-10-CM | POA: Diagnosis not present

## 2018-10-25 DIAGNOSIS — N2581 Secondary hyperparathyroidism of renal origin: Secondary | ICD-10-CM | POA: Diagnosis not present

## 2018-10-25 DIAGNOSIS — N186 End stage renal disease: Secondary | ICD-10-CM | POA: Diagnosis not present

## 2018-10-26 DIAGNOSIS — N186 End stage renal disease: Secondary | ICD-10-CM | POA: Diagnosis not present

## 2018-10-26 DIAGNOSIS — N2581 Secondary hyperparathyroidism of renal origin: Secondary | ICD-10-CM | POA: Diagnosis not present

## 2018-10-27 DIAGNOSIS — N2581 Secondary hyperparathyroidism of renal origin: Secondary | ICD-10-CM | POA: Diagnosis not present

## 2018-10-27 DIAGNOSIS — N186 End stage renal disease: Secondary | ICD-10-CM | POA: Diagnosis not present

## 2018-10-28 ENCOUNTER — Encounter: Payer: Federal, State, Local not specified - PPO | Admitting: Gastroenterology

## 2018-10-28 ENCOUNTER — Telehealth: Payer: Self-pay | Admitting: *Deleted

## 2018-10-28 DIAGNOSIS — N186 End stage renal disease: Secondary | ICD-10-CM | POA: Diagnosis not present

## 2018-10-28 DIAGNOSIS — N2581 Secondary hyperparathyroidism of renal origin: Secondary | ICD-10-CM | POA: Diagnosis not present

## 2018-10-28 NOTE — Telephone Encounter (Signed)
Covid-19 travel screening questions  Have you traveled in the last 14 days? No If yes where?  Do you now or have you had a fever in the last 14 days? No  Do you have any respiratory symptoms of shortness of breath or cough now or in the last 14 days? No  Do you have any family members or close contacts with diagnosed or suspected Covid-19? No       

## 2018-10-29 ENCOUNTER — Encounter: Payer: Self-pay | Admitting: Gastroenterology

## 2018-10-29 ENCOUNTER — Other Ambulatory Visit: Payer: Self-pay

## 2018-10-29 ENCOUNTER — Ambulatory Visit (AMBULATORY_SURGERY_CENTER): Payer: Federal, State, Local not specified - PPO | Admitting: Gastroenterology

## 2018-10-29 VITALS — BP 144/90 | HR 85 | Temp 98.0°F | Resp 14 | Ht 66.0 in | Wt 222.0 lb

## 2018-10-29 DIAGNOSIS — D509 Iron deficiency anemia, unspecified: Secondary | ICD-10-CM

## 2018-10-29 DIAGNOSIS — K2960 Other gastritis without bleeding: Secondary | ICD-10-CM | POA: Diagnosis not present

## 2018-10-29 DIAGNOSIS — K3189 Other diseases of stomach and duodenum: Secondary | ICD-10-CM

## 2018-10-29 DIAGNOSIS — R109 Unspecified abdominal pain: Secondary | ICD-10-CM | POA: Diagnosis not present

## 2018-10-29 DIAGNOSIS — R1013 Epigastric pain: Secondary | ICD-10-CM

## 2018-10-29 DIAGNOSIS — N186 End stage renal disease: Secondary | ICD-10-CM | POA: Diagnosis not present

## 2018-10-29 DIAGNOSIS — N2581 Secondary hyperparathyroidism of renal origin: Secondary | ICD-10-CM | POA: Diagnosis not present

## 2018-10-29 MED ORDER — SODIUM CHLORIDE 0.9 % IV SOLN: 500.0000 mL | Freq: Once | INTRAVENOUS | Status: DC

## 2018-10-29 NOTE — Op Note (Signed)
[place] Patient Name: Hector Mahoney Procedure Date: 10/29/2018 2:43 PM MRN: [id number] Endoscopist: Ladene Artist , MD Age: 48 Referring MD:  Date of Birth: 1970-11-03 Gender: Male Account #: [id number] Procedure:                Upper GI endoscopy Indications:              Epigastric abdominal pain, Iron deficiency anemia,                            Suspected gastroesophageal reflux disease Medicines:                Monitored Anesthesia Care Procedure:                Pre-Anesthesia Assessment:                           - Prior to the procedure, a History and Physical                            was performed, and patient medications and                            allergies were reviewed. The patient's tolerance of                            previous anesthesia was also reviewed. The risks                            and benefits of the procedure and the sedation                            options and risks were discussed with the patient.                            All questions were answered, and informed consent                            was obtained. Prior Anticoagulants: The patient has                            taken no previous anticoagulant or antiplatelet                            agents. ASA Grade Assessment: III - A patient with                            severe systemic disease. After reviewing the risks                            and benefits, the patient was deemed in                            satisfactory condition to undergo the procedure.  After obtaining informed consent, the endoscope was                            passed under direct vision. Throughout the                            procedure, the patient's blood pressure, pulse, and                            oxygen saturations were monitored continuously. The                            Endoscope was introduced through the mouth, and                            advanced  to the second part of duodenum. The upper                            GI endoscopy was accomplished without difficulty.                            The patient tolerated the procedure well. Scope In: Scope Out: Findings:                 The examined esophagus was normal.                           Multiple dispersed, medium non-bleeding erosions                            were found in the gastric body and in the gastric                            antrum. There were no stigmata of recent bleeding.                            Biopsies were taken with a cold forceps for                            histology.                           The exam of the stomach was otherwise normal.                           Patchy mildly erythematous mucosa without active                            bleeding and with no stigmata of bleeding was found                            in the duodenal bulb.                           The second portion of the  duodenum was normal. Complications:            No immediate complications. Estimated Blood Loss:     Estimated blood loss was minimal. Impression:               - Normal esophagus.                           - Non-bleeding erosive gastropathy. Biopsied.                           - Erythematous duodenopathy.                           - Normal second portion of the duodenum. Recommendation:           - Patient has a contact number available for                            emergencies. The signs and symptoms of potential                            delayed complications were discussed with the                            patient. Return to normal activities tomorrow.                            Written discharge instructions were provided to the                            patient.                           - Resume previous diet.                           - Antireflux measures long term.                           - Continue present medications including omeprazole                             20 mg po bid.                           - No aspirin, ibuprofen, naproxen, or other                            non-steroidal anti-inflammatory drugs. EC ASA 81 mg                            qd is ok.                           - Await pathology results.                           -  Perform a colonoscopy at the next available                            appointment. Ladene Artist, MD 10/29/2018 3:09:44 PM This report has been signed electronically.

## 2018-10-29 NOTE — Progress Notes (Signed)
PT taken to PACU. Monitors in place. VSS. Report given to RN. 

## 2018-10-29 NOTE — Progress Notes (Addendum)
error 

## 2018-10-29 NOTE — Progress Notes (Signed)
Called to room to assist during endoscopic procedure.  Patient ID and intended procedure confirmed with present staff. Received instructions for my participation in the procedure from the performing physician.  

## 2018-10-29 NOTE — Patient Instructions (Addendum)
Handouts given for Gastritis and Anti-Reflux measures.   Colonoscopy and Pre-visit scheduled.   NO Aspirin, aspirin containing products, ibuprofen, naproxen, or other non-steroidal anti-inflammatory drugs.  Enteric-coated Aspirin 81mg  is ok.    YOU HAD AN ENDOSCOPIC PROCEDURE TODAY AT [facility]:   Refer to the procedure report that was given to you for any specific questions about what was found during the examination.  If the procedure report does not answer your questions, please call your gastroenterologist to clarify.  If you requested that your care partner not be given the details of your procedure findings, then the procedure report has been included in a sealed envelope for you to review at your convenience later.  YOU SHOULD EXPECT: Some feelings of bloating in the abdomen. Passage of more gas than usual.  Walking can help get rid of the air that was put into your GI tract during the procedure and reduce the bloating. If you had a lower endoscopy (such as a colonoscopy or flexible sigmoidoscopy) you may notice spotting of blood in your stool or on the toilet paper. If you underwent a bowel prep for your procedure, you may not have a normal bowel movement for a few days.  Please Note:  You might notice some irritation and congestion in your nose or some drainage.  This is from the oxygen used during your procedure.  There is no need for concern and it should clear up in a day or so.  SYMPTOMS TO REPORT IMMEDIATELY:    Following upper endoscopy (EGD)  Vomiting of blood or coffee ground material  New chest pain or pain under the shoulder blades  Painful or persistently difficult swallowing  New shortness of breath  Fever of 100F or higher  Black, tarry-looking stools  For urgent or emergent issues, a gastroenterologist can be reached at any hour by calling [phone].   DIET:  We do recommend a small meal at first, but then you may proceed to your regular  diet.  Drink plenty of fluids but you should avoid alcoholic beverages for 24 hours.  ACTIVITY:  You should plan to take it easy for the rest of today and you should NOT DRIVE or use heavy machinery until tomorrow (because of the sedation medicines used during the test).    FOLLOW UP: Our staff will call the number listed on your records the next business day following your procedure to check on you and address any questions or concerns that you may have regarding the information given to you following your procedure. If we do not reach you, we will leave a message.  However, if you are feeling well and you are not experiencing any problems, there is no need to return our call.  We will assume that you have returned to your regular daily activities without incident.  If any biopsies were taken you will be contacted by phone or by letter within the next 1-3 weeks.  Please call us at [phone] if you have not heard about the biopsies in 3 weeks.    SIGNATURES/CONFIDENTIALITY: You and/or your care partner have signed paperwork which will be entered into your electronic medical record.  These signatures attest to the fact that that the information above on your After Visit Summary has been reviewed and is understood.  Full responsibility of the confidentiality of this discharge information lies with you and/or your care-partner.

## 2018-10-29 NOTE — Progress Notes (Signed)
  Colonoscopy for IDA scheduled for 12/02/18 per order Dr Fuller Plan

## 2018-10-30 ENCOUNTER — Telehealth: Payer: Self-pay

## 2018-10-30 DIAGNOSIS — N2581 Secondary hyperparathyroidism of renal origin: Secondary | ICD-10-CM | POA: Diagnosis not present

## 2018-10-30 DIAGNOSIS — N186 End stage renal disease: Secondary | ICD-10-CM | POA: Diagnosis not present

## 2018-10-30 NOTE — Telephone Encounter (Signed)
  Follow up Call-  Call back number 10/29/2018  Post procedure Call Back phone  # [phone]  Permission to leave phone message Yes  Some recent data might be hidden     Patient questions:  Do you have a fever, pain , or abdominal swelling? No. Pain Score  0 *  Have you tolerated food without any problems? Yes.    Have you been able to return to your normal activities? Yes.    Do you have any questions about your discharge instructions: Diet   No. Medications  No. Follow up visit  No.  Do you have questions or concerns about your Care? No.  Actions: * If pain score is 4 or above: No action needed, pain <4.

## 2018-10-31 DIAGNOSIS — N2581 Secondary hyperparathyroidism of renal origin: Secondary | ICD-10-CM | POA: Diagnosis not present

## 2018-10-31 DIAGNOSIS — N186 End stage renal disease: Secondary | ICD-10-CM | POA: Diagnosis not present

## 2018-11-01 DIAGNOSIS — N186 End stage renal disease: Secondary | ICD-10-CM | POA: Diagnosis not present

## 2018-11-01 DIAGNOSIS — N2581 Secondary hyperparathyroidism of renal origin: Secondary | ICD-10-CM | POA: Diagnosis not present

## 2018-11-02 DIAGNOSIS — N186 End stage renal disease: Secondary | ICD-10-CM | POA: Diagnosis not present

## 2018-11-02 DIAGNOSIS — N2581 Secondary hyperparathyroidism of renal origin: Secondary | ICD-10-CM | POA: Diagnosis not present

## 2018-11-03 DIAGNOSIS — N186 End stage renal disease: Secondary | ICD-10-CM | POA: Diagnosis not present

## 2018-11-03 DIAGNOSIS — N2581 Secondary hyperparathyroidism of renal origin: Secondary | ICD-10-CM | POA: Diagnosis not present

## 2018-11-04 DIAGNOSIS — N186 End stage renal disease: Secondary | ICD-10-CM | POA: Diagnosis not present

## 2018-11-04 DIAGNOSIS — N2581 Secondary hyperparathyroidism of renal origin: Secondary | ICD-10-CM | POA: Diagnosis not present

## 2018-11-05 ENCOUNTER — Encounter: Payer: Self-pay | Admitting: Gastroenterology

## 2018-11-05 DIAGNOSIS — N186 End stage renal disease: Secondary | ICD-10-CM | POA: Diagnosis not present

## 2018-11-05 DIAGNOSIS — N2581 Secondary hyperparathyroidism of renal origin: Secondary | ICD-10-CM | POA: Diagnosis not present

## 2018-11-06 DIAGNOSIS — N186 End stage renal disease: Secondary | ICD-10-CM | POA: Diagnosis not present

## 2018-11-06 DIAGNOSIS — N2581 Secondary hyperparathyroidism of renal origin: Secondary | ICD-10-CM | POA: Diagnosis not present

## 2018-11-07 DIAGNOSIS — N2581 Secondary hyperparathyroidism of renal origin: Secondary | ICD-10-CM | POA: Diagnosis not present

## 2018-11-07 DIAGNOSIS — N186 End stage renal disease: Secondary | ICD-10-CM | POA: Diagnosis not present

## 2018-11-08 DIAGNOSIS — N2581 Secondary hyperparathyroidism of renal origin: Secondary | ICD-10-CM | POA: Diagnosis not present

## 2018-11-08 DIAGNOSIS — N186 End stage renal disease: Secondary | ICD-10-CM | POA: Diagnosis not present

## 2018-11-09 DIAGNOSIS — N2581 Secondary hyperparathyroidism of renal origin: Secondary | ICD-10-CM | POA: Diagnosis not present

## 2018-11-09 DIAGNOSIS — N186 End stage renal disease: Secondary | ICD-10-CM | POA: Diagnosis not present

## 2018-11-10 DIAGNOSIS — N186 End stage renal disease: Secondary | ICD-10-CM | POA: Diagnosis not present

## 2018-11-10 DIAGNOSIS — N2581 Secondary hyperparathyroidism of renal origin: Secondary | ICD-10-CM | POA: Diagnosis not present

## 2018-11-10 DIAGNOSIS — E103513 Type 1 diabetes mellitus with proliferative diabetic retinopathy with macular edema, bilateral: Secondary | ICD-10-CM | POA: Diagnosis not present

## 2018-11-11 DIAGNOSIS — N2581 Secondary hyperparathyroidism of renal origin: Secondary | ICD-10-CM | POA: Diagnosis not present

## 2018-11-11 DIAGNOSIS — E1022 Type 1 diabetes mellitus with diabetic chronic kidney disease: Secondary | ICD-10-CM | POA: Diagnosis not present

## 2018-11-11 DIAGNOSIS — N186 End stage renal disease: Secondary | ICD-10-CM | POA: Diagnosis not present

## 2018-11-12 DIAGNOSIS — N2581 Secondary hyperparathyroidism of renal origin: Secondary | ICD-10-CM | POA: Diagnosis not present

## 2018-11-12 DIAGNOSIS — N186 End stage renal disease: Secondary | ICD-10-CM | POA: Diagnosis not present

## 2018-11-13 DIAGNOSIS — N186 End stage renal disease: Secondary | ICD-10-CM | POA: Diagnosis not present

## 2018-11-13 DIAGNOSIS — N2581 Secondary hyperparathyroidism of renal origin: Secondary | ICD-10-CM | POA: Diagnosis not present

## 2018-11-14 DIAGNOSIS — N186 End stage renal disease: Secondary | ICD-10-CM | POA: Diagnosis not present

## 2018-11-14 DIAGNOSIS — N2581 Secondary hyperparathyroidism of renal origin: Secondary | ICD-10-CM | POA: Diagnosis not present

## 2018-11-15 DIAGNOSIS — N2581 Secondary hyperparathyroidism of renal origin: Secondary | ICD-10-CM | POA: Diagnosis not present

## 2018-11-15 DIAGNOSIS — N186 End stage renal disease: Secondary | ICD-10-CM | POA: Diagnosis not present

## 2018-11-16 DIAGNOSIS — N2581 Secondary hyperparathyroidism of renal origin: Secondary | ICD-10-CM | POA: Diagnosis not present

## 2018-11-16 DIAGNOSIS — N186 End stage renal disease: Secondary | ICD-10-CM | POA: Diagnosis not present

## 2018-11-17 ENCOUNTER — Encounter: Payer: Federal, State, Local not specified - PPO | Admitting: Gastroenterology

## 2018-11-17 DIAGNOSIS — N186 End stage renal disease: Secondary | ICD-10-CM | POA: Diagnosis not present

## 2018-11-17 DIAGNOSIS — N2581 Secondary hyperparathyroidism of renal origin: Secondary | ICD-10-CM | POA: Diagnosis not present

## 2018-11-18 DIAGNOSIS — N186 End stage renal disease: Secondary | ICD-10-CM | POA: Diagnosis not present

## 2018-11-18 DIAGNOSIS — N2581 Secondary hyperparathyroidism of renal origin: Secondary | ICD-10-CM | POA: Diagnosis not present

## 2018-11-19 DIAGNOSIS — N186 End stage renal disease: Secondary | ICD-10-CM | POA: Diagnosis not present

## 2018-11-19 DIAGNOSIS — N2581 Secondary hyperparathyroidism of renal origin: Secondary | ICD-10-CM | POA: Diagnosis not present

## 2018-11-20 ENCOUNTER — Telehealth: Payer: Self-pay

## 2018-11-20 DIAGNOSIS — N2581 Secondary hyperparathyroidism of renal origin: Secondary | ICD-10-CM | POA: Diagnosis not present

## 2018-11-20 DIAGNOSIS — N186 End stage renal disease: Secondary | ICD-10-CM | POA: Diagnosis not present

## 2018-11-20 NOTE — Telephone Encounter (Signed)
Called patient and was able to move him back to 12/04/18 with Dr. Fuller Plan. He understood and had no further questions.

## 2018-11-21 DIAGNOSIS — N186 End stage renal disease: Secondary | ICD-10-CM | POA: Diagnosis not present

## 2018-11-21 DIAGNOSIS — N2581 Secondary hyperparathyroidism of renal origin: Secondary | ICD-10-CM | POA: Diagnosis not present

## 2018-11-22 DIAGNOSIS — N186 End stage renal disease: Secondary | ICD-10-CM | POA: Diagnosis not present

## 2018-11-22 DIAGNOSIS — N2581 Secondary hyperparathyroidism of renal origin: Secondary | ICD-10-CM | POA: Diagnosis not present

## 2018-11-23 DIAGNOSIS — N2581 Secondary hyperparathyroidism of renal origin: Secondary | ICD-10-CM | POA: Diagnosis not present

## 2018-11-23 DIAGNOSIS — N186 End stage renal disease: Secondary | ICD-10-CM | POA: Diagnosis not present

## 2018-11-24 DIAGNOSIS — N2581 Secondary hyperparathyroidism of renal origin: Secondary | ICD-10-CM | POA: Diagnosis not present

## 2018-11-24 DIAGNOSIS — N186 End stage renal disease: Secondary | ICD-10-CM | POA: Diagnosis not present

## 2018-11-25 ENCOUNTER — Other Ambulatory Visit: Payer: Self-pay

## 2018-11-25 ENCOUNTER — Encounter: Payer: Self-pay | Admitting: Gastroenterology

## 2018-11-25 ENCOUNTER — Ambulatory Visit (AMBULATORY_SURGERY_CENTER): Payer: Self-pay

## 2018-11-25 VITALS — Ht 66.0 in | Wt 208.0 lb

## 2018-11-25 DIAGNOSIS — N186 End stage renal disease: Secondary | ICD-10-CM | POA: Diagnosis not present

## 2018-11-25 DIAGNOSIS — D509 Iron deficiency anemia, unspecified: Secondary | ICD-10-CM

## 2018-11-25 DIAGNOSIS — N2581 Secondary hyperparathyroidism of renal origin: Secondary | ICD-10-CM | POA: Diagnosis not present

## 2018-11-25 MED ORDER — NA SULFATE-K SULFATE-MG SULF 17.5-3.13-1.6 GM/177ML PO SOLN: 1.0000 | Freq: Once | ORAL | 0 refills | Status: AC

## 2018-11-25 NOTE — Progress Notes (Signed)
Denies allergies to eggs or soy products. Denies complication of anesthesia or sedation. Denies use of weight loss medication. Denies use of O2.   Emmi instructions declined.   Pre-Visit was conducted by phone due to Covid 19. Insurance # was verified. Instructions were reviewed and mailed to patients confirmed address. Patient was encouraged to call us if he had any questions or concerns regarding instructions.

## 2018-11-26 DIAGNOSIS — N186 End stage renal disease: Secondary | ICD-10-CM | POA: Diagnosis not present

## 2018-11-26 DIAGNOSIS — N2581 Secondary hyperparathyroidism of renal origin: Secondary | ICD-10-CM | POA: Diagnosis not present

## 2018-11-27 DIAGNOSIS — N2581 Secondary hyperparathyroidism of renal origin: Secondary | ICD-10-CM | POA: Diagnosis not present

## 2018-11-27 DIAGNOSIS — N186 End stage renal disease: Secondary | ICD-10-CM | POA: Diagnosis not present

## 2018-11-28 DIAGNOSIS — N186 End stage renal disease: Secondary | ICD-10-CM | POA: Diagnosis not present

## 2018-11-28 DIAGNOSIS — N2581 Secondary hyperparathyroidism of renal origin: Secondary | ICD-10-CM | POA: Diagnosis not present

## 2018-11-29 DIAGNOSIS — N186 End stage renal disease: Secondary | ICD-10-CM | POA: Diagnosis not present

## 2018-11-29 DIAGNOSIS — N2581 Secondary hyperparathyroidism of renal origin: Secondary | ICD-10-CM | POA: Diagnosis not present

## 2018-11-30 DIAGNOSIS — N186 End stage renal disease: Secondary | ICD-10-CM | POA: Diagnosis not present

## 2018-11-30 DIAGNOSIS — N2581 Secondary hyperparathyroidism of renal origin: Secondary | ICD-10-CM | POA: Diagnosis not present

## 2018-12-01 DIAGNOSIS — N2581 Secondary hyperparathyroidism of renal origin: Secondary | ICD-10-CM | POA: Diagnosis not present

## 2018-12-01 DIAGNOSIS — N186 End stage renal disease: Secondary | ICD-10-CM | POA: Diagnosis not present

## 2018-12-02 ENCOUNTER — Encounter: Payer: Federal, State, Local not specified - PPO | Admitting: Gastroenterology

## 2018-12-02 DIAGNOSIS — N2581 Secondary hyperparathyroidism of renal origin: Secondary | ICD-10-CM | POA: Diagnosis not present

## 2018-12-02 DIAGNOSIS — N186 End stage renal disease: Secondary | ICD-10-CM | POA: Diagnosis not present

## 2018-12-03 ENCOUNTER — Telehealth: Payer: Self-pay | Admitting: *Deleted

## 2018-12-03 DIAGNOSIS — N2581 Secondary hyperparathyroidism of renal origin: Secondary | ICD-10-CM | POA: Diagnosis not present

## 2018-12-03 DIAGNOSIS — N186 End stage renal disease: Secondary | ICD-10-CM | POA: Diagnosis not present

## 2018-12-03 NOTE — Telephone Encounter (Signed)
Covid-19 travel screening questions  Have you traveled in the last 14 days?no If yes where?  Do you now or have you had a fever in the last 14 days? no  Do you have any respiratory symptoms of shortness of breath or cough now or in the last 14 days? no  Do you have any family members or close contacts with diagnosed or suspected Covid-19? No  Pt is aware that carepartner will be asked to wait in the car during his procedure.

## 2018-12-04 ENCOUNTER — Other Ambulatory Visit: Payer: Self-pay

## 2018-12-04 ENCOUNTER — Encounter: Payer: Self-pay | Admitting: Gastroenterology

## 2018-12-04 ENCOUNTER — Ambulatory Visit (AMBULATORY_SURGERY_CENTER): Payer: Federal, State, Local not specified - PPO | Admitting: Gastroenterology

## 2018-12-04 VITALS — BP 130/97 | HR 84 | Temp 98.7°F | Resp 18 | Ht 66.0 in | Wt 222.0 lb

## 2018-12-04 DIAGNOSIS — D509 Iron deficiency anemia, unspecified: Secondary | ICD-10-CM

## 2018-12-04 DIAGNOSIS — N186 End stage renal disease: Secondary | ICD-10-CM | POA: Diagnosis not present

## 2018-12-04 DIAGNOSIS — K648 Other hemorrhoids: Secondary | ICD-10-CM | POA: Diagnosis not present

## 2018-12-04 DIAGNOSIS — N2581 Secondary hyperparathyroidism of renal origin: Secondary | ICD-10-CM | POA: Diagnosis not present

## 2018-12-04 DIAGNOSIS — D124 Benign neoplasm of descending colon: Secondary | ICD-10-CM | POA: Diagnosis not present

## 2018-12-04 MED ORDER — SODIUM CHLORIDE 0.9 % IV SOLN: 500.0000 mL | Freq: Once | INTRAVENOUS | Status: DC

## 2018-12-04 NOTE — Progress Notes (Signed)
Called to room to assist during endoscopic procedure.  Patient ID and intended procedure confirmed with present staff. Received instructions for my participation in the procedure from the performing physician.  

## 2018-12-04 NOTE — Op Note (Signed)
[place] Patient Name: Hector Mahoney Procedure Date: 12/04/2018 9:49 AM MRN: [id number] Endoscopist: Ladene Artist , MD Age: 48 Referring MD:  Date of Birth: 24-Apr-1971 Gender: Male Account #: [id number] Procedure:                Colonoscopy Indications:              Iron deficiency anemia Medicines:                Monitored Anesthesia Care Procedure:                Pre-Anesthesia Assessment:                           - Prior to the procedure, a History and Physical                            was performed, and patient medications and                            allergies were reviewed. The patient's tolerance of                            previous anesthesia was also reviewed. The risks                            and benefits of the procedure and the sedation                            options and risks were discussed with the patient.                            All questions were answered, and informed consent                            was obtained. Prior Anticoagulants: The patient has                            taken no previous anticoagulant or antiplatelet                            agents. ASA Grade Assessment: III - A patient with                            severe systemic disease. After reviewing the risks                            and benefits, the patient was deemed in                            satisfactory condition to undergo the procedure.                           After obtaining informed consent, the colonoscope  was passed under direct vision. Throughout the                            procedure, the patient's blood pressure, pulse, and                            oxygen saturations were monitored continuously. The                            Colonoscope was introduced through the anus and                            advanced to the the cecum, identified by                            appendiceal orifice and ileocecal valve.  The                            ileocecal valve, appendiceal orifice, and rectum                            were photographed. The quality of the bowel                            preparation was excellent. The colonoscopy was                            performed without difficulty. The patient tolerated                            the procedure well. Scope In: 9:55:17 AM Scope Out: 10:10:47 AM Scope Withdrawal Time: 0 hours 13 minutes 29 seconds  Total Procedure Duration: 0 hours 15 minutes 30 seconds  Findings:                 The perianal and digital rectal examinations were                            normal.                           A 8 mm polyp was found in the descending colon. The                            polyp was semi-pedunculated. The polyp was removed                            with a cold snare. Resection and retrieval were                            complete.                           Internal hemorrhoids were found during  retroflexion. The hemorrhoids were small and Grade                            I (internal hemorrhoids that do not prolapse).                           The exam was otherwise without abnormality on                            direct and retroflexion views. Complications:            No immediate complications. Estimated blood loss:                            None. Estimated Blood Loss:     Estimated blood loss: none. Impression:               - One 8 mm polyp in the descending colon, removed                            with a cold snare. Resected and retrieved.                           - Internal hemorrhoids.                           - The examination was otherwise normal on direct                            and retroflexion views. Recommendation:           - Repeat colonoscopy date to be determined after                            pending pathology results are reviewed for                            surveillance.                            - Patient has a contact number available for                            emergencies. The signs and symptoms of potential                            delayed complications were discussed with the                            patient. Return to normal activities tomorrow.                            Written discharge instructions were provided to the                            patient.                           -  Resume previous diet.                           - Continue present medications.                           - Await pathology results.                           - No aspirin, ibuprofen, naproxen, or other                            non-steroidal anti-inflammatory drugs for 2 weeks                            after polyp removal.                           - Drs. John and Coladonato to monitor anemia.                            Consider VCE if iron deficiency does not correct or                            if it recurs. Ladene Artist, MD 12/04/2018 10:17:56 AM This report has been signed electronically.

## 2018-12-04 NOTE — Progress Notes (Signed)
Report given to PACU, vss 

## 2018-12-04 NOTE — Progress Notes (Signed)
Pt's states no medical or surgical changes since previsit or office visit. 

## 2018-12-04 NOTE — Patient Instructions (Signed)
Handouts provided:  Polyps and Hemorrhoids  YOU HAD AN ENDOSCOPIC PROCEDURE TODAY AT [facility]:   Refer to the procedure report that was given to you for any specific questions about what was found during the examination.  If the procedure report does not answer your questions, please call your gastroenterologist to clarify.  If you requested that your care partner not be given the details of your procedure findings, then the procedure report has been included in a sealed envelope for you to review at your convenience later.  YOU SHOULD EXPECT: Some feelings of bloating in the abdomen. Passage of more gas than usual.  Walking can help get rid of the air that was put into your GI tract during the procedure and reduce the bloating. If you had a lower endoscopy (such as a colonoscopy or flexible sigmoidoscopy) you may notice spotting of blood in your stool or on the toilet paper. If you underwent a bowel prep for your procedure, you may not have a normal bowel movement for a few days.  Please Note:  You might notice some irritation and congestion in your nose or some drainage.  This is from the oxygen used during your procedure.  There is no need for concern and it should clear up in a day or so.  SYMPTOMS TO REPORT IMMEDIATELY:   Following lower endoscopy (colonoscopy or flexible sigmoidoscopy):  Excessive amounts of blood in the stool  Significant tenderness or worsening of abdominal pains  Swelling of the abdomen that is new, acute  Fever of 100F or higher  For urgent or emergent issues, a gastroenterologist can be reached at any hour by calling [phone].   DIET:  We do recommend a small meal at first, but then you may proceed to your regular diet.  Drink plenty of fluids but you should avoid alcoholic beverages for 24 hours.  ACTIVITY:  You should plan to take it easy for the rest of today and you should NOT DRIVE or use heavy machinery until tomorrow (because of the  sedation medicines used during the test).    FOLLOW UP: Our staff will call the number listed on your records the next business day following your procedure to check on you and address any questions or concerns that you may have regarding the information given to you following your procedure. If we do not reach you, we will leave a message.  However, if you are feeling well and you are not experiencing any problems, there is no need to return our call.  We will assume that you have returned to your regular daily activities without incident.  If any biopsies were taken you will be contacted by phone or by letter within the next 1-3 weeks.  Please call us at [phone] if you have not heard about the biopsies in 3 weeks.    SIGNATURES/CONFIDENTIALITY: You and/or your care partner have signed paperwork which will be entered into your electronic medical record.  These signatures attest to the fact that that the information above on your After Visit Summary has been reviewed and is understood.  Full responsibility of the confidentiality of this discharge information lies with you and/or your care-partner.

## 2018-12-05 ENCOUNTER — Telehealth: Payer: Self-pay | Admitting: *Deleted

## 2018-12-05 DIAGNOSIS — N2581 Secondary hyperparathyroidism of renal origin: Secondary | ICD-10-CM | POA: Diagnosis not present

## 2018-12-05 DIAGNOSIS — N186 End stage renal disease: Secondary | ICD-10-CM | POA: Diagnosis not present

## 2018-12-05 NOTE — Telephone Encounter (Signed)
  Follow up Call-  Call back number 12/04/2018 10/29/2018  Post procedure Call Back phone  # Hector Mahoney [phone]  Permission to leave phone message Yes Yes  Some recent data might be hidden    [facility]

## 2018-12-06 DIAGNOSIS — N186 End stage renal disease: Secondary | ICD-10-CM | POA: Diagnosis not present

## 2018-12-06 DIAGNOSIS — N2581 Secondary hyperparathyroidism of renal origin: Secondary | ICD-10-CM | POA: Diagnosis not present

## 2018-12-07 ENCOUNTER — Other Ambulatory Visit: Payer: Self-pay | Admitting: Internal Medicine

## 2018-12-07 DIAGNOSIS — N186 End stage renal disease: Secondary | ICD-10-CM | POA: Diagnosis not present

## 2018-12-07 DIAGNOSIS — N2581 Secondary hyperparathyroidism of renal origin: Secondary | ICD-10-CM | POA: Diagnosis not present

## 2018-12-08 DIAGNOSIS — N186 End stage renal disease: Secondary | ICD-10-CM | POA: Diagnosis not present

## 2018-12-08 DIAGNOSIS — N2581 Secondary hyperparathyroidism of renal origin: Secondary | ICD-10-CM | POA: Diagnosis not present

## 2018-12-09 DIAGNOSIS — N2581 Secondary hyperparathyroidism of renal origin: Secondary | ICD-10-CM | POA: Diagnosis not present

## 2018-12-09 DIAGNOSIS — N186 End stage renal disease: Secondary | ICD-10-CM | POA: Diagnosis not present

## 2018-12-10 ENCOUNTER — Encounter: Payer: Self-pay | Admitting: Gastroenterology

## 2018-12-10 DIAGNOSIS — N2581 Secondary hyperparathyroidism of renal origin: Secondary | ICD-10-CM | POA: Diagnosis not present

## 2018-12-10 DIAGNOSIS — N186 End stage renal disease: Secondary | ICD-10-CM | POA: Diagnosis not present

## 2018-12-10 NOTE — Telephone Encounter (Signed)
Pt called back and stated that everything is fine.

## 2018-12-11 DIAGNOSIS — Z992 Dependence on renal dialysis: Secondary | ICD-10-CM | POA: Diagnosis not present

## 2018-12-11 DIAGNOSIS — N2581 Secondary hyperparathyroidism of renal origin: Secondary | ICD-10-CM | POA: Diagnosis not present

## 2018-12-11 DIAGNOSIS — N186 End stage renal disease: Secondary | ICD-10-CM | POA: Diagnosis not present

## 2018-12-11 DIAGNOSIS — Z01818 Encounter for other preprocedural examination: Secondary | ICD-10-CM | POA: Diagnosis not present

## 2018-12-11 DIAGNOSIS — E1022 Type 1 diabetes mellitus with diabetic chronic kidney disease: Secondary | ICD-10-CM | POA: Diagnosis not present

## 2018-12-12 DIAGNOSIS — N2581 Secondary hyperparathyroidism of renal origin: Secondary | ICD-10-CM | POA: Diagnosis not present

## 2018-12-12 DIAGNOSIS — N186 End stage renal disease: Secondary | ICD-10-CM | POA: Diagnosis not present

## 2018-12-13 DIAGNOSIS — N2581 Secondary hyperparathyroidism of renal origin: Secondary | ICD-10-CM | POA: Diagnosis not present

## 2018-12-13 DIAGNOSIS — N186 End stage renal disease: Secondary | ICD-10-CM | POA: Diagnosis not present

## 2018-12-14 DIAGNOSIS — N2581 Secondary hyperparathyroidism of renal origin: Secondary | ICD-10-CM | POA: Diagnosis not present

## 2018-12-14 DIAGNOSIS — N186 End stage renal disease: Secondary | ICD-10-CM | POA: Diagnosis not present

## 2018-12-15 DIAGNOSIS — E103513 Type 1 diabetes mellitus with proliferative diabetic retinopathy with macular edema, bilateral: Secondary | ICD-10-CM | POA: Diagnosis not present

## 2018-12-15 DIAGNOSIS — N2581 Secondary hyperparathyroidism of renal origin: Secondary | ICD-10-CM | POA: Diagnosis not present

## 2018-12-15 DIAGNOSIS — N186 End stage renal disease: Secondary | ICD-10-CM | POA: Diagnosis not present

## 2018-12-16 DIAGNOSIS — N2581 Secondary hyperparathyroidism of renal origin: Secondary | ICD-10-CM | POA: Diagnosis not present

## 2018-12-16 DIAGNOSIS — N186 End stage renal disease: Secondary | ICD-10-CM | POA: Diagnosis not present

## 2018-12-17 DIAGNOSIS — N2581 Secondary hyperparathyroidism of renal origin: Secondary | ICD-10-CM | POA: Diagnosis not present

## 2018-12-17 DIAGNOSIS — Z7682 Awaiting organ transplant status: Secondary | ICD-10-CM | POA: Diagnosis not present

## 2018-12-17 DIAGNOSIS — N186 End stage renal disease: Secondary | ICD-10-CM | POA: Diagnosis not present

## 2018-12-18 DIAGNOSIS — N2581 Secondary hyperparathyroidism of renal origin: Secondary | ICD-10-CM | POA: Diagnosis not present

## 2018-12-18 DIAGNOSIS — N186 End stage renal disease: Secondary | ICD-10-CM | POA: Diagnosis not present

## 2018-12-19 DIAGNOSIS — N2581 Secondary hyperparathyroidism of renal origin: Secondary | ICD-10-CM | POA: Diagnosis not present

## 2018-12-19 DIAGNOSIS — N186 End stage renal disease: Secondary | ICD-10-CM | POA: Diagnosis not present

## 2018-12-20 DIAGNOSIS — N2581 Secondary hyperparathyroidism of renal origin: Secondary | ICD-10-CM | POA: Diagnosis not present

## 2018-12-20 DIAGNOSIS — N186 End stage renal disease: Secondary | ICD-10-CM | POA: Diagnosis not present

## 2018-12-21 DIAGNOSIS — N186 End stage renal disease: Secondary | ICD-10-CM | POA: Diagnosis not present

## 2018-12-21 DIAGNOSIS — N2581 Secondary hyperparathyroidism of renal origin: Secondary | ICD-10-CM | POA: Diagnosis not present

## 2018-12-22 DIAGNOSIS — N186 End stage renal disease: Secondary | ICD-10-CM | POA: Diagnosis not present

## 2018-12-22 DIAGNOSIS — N2581 Secondary hyperparathyroidism of renal origin: Secondary | ICD-10-CM | POA: Diagnosis not present

## 2018-12-23 DIAGNOSIS — N2581 Secondary hyperparathyroidism of renal origin: Secondary | ICD-10-CM | POA: Diagnosis not present

## 2018-12-23 DIAGNOSIS — N186 End stage renal disease: Secondary | ICD-10-CM | POA: Diagnosis not present

## 2018-12-24 DIAGNOSIS — N2581 Secondary hyperparathyroidism of renal origin: Secondary | ICD-10-CM | POA: Diagnosis not present

## 2018-12-24 DIAGNOSIS — N186 End stage renal disease: Secondary | ICD-10-CM | POA: Diagnosis not present

## 2018-12-25 DIAGNOSIS — N186 End stage renal disease: Secondary | ICD-10-CM | POA: Diagnosis not present

## 2018-12-25 DIAGNOSIS — N2581 Secondary hyperparathyroidism of renal origin: Secondary | ICD-10-CM | POA: Diagnosis not present

## 2018-12-26 DIAGNOSIS — N186 End stage renal disease: Secondary | ICD-10-CM | POA: Diagnosis not present

## 2018-12-26 DIAGNOSIS — N2581 Secondary hyperparathyroidism of renal origin: Secondary | ICD-10-CM | POA: Diagnosis not present

## 2018-12-27 DIAGNOSIS — N2581 Secondary hyperparathyroidism of renal origin: Secondary | ICD-10-CM | POA: Diagnosis not present

## 2018-12-27 DIAGNOSIS — N186 End stage renal disease: Secondary | ICD-10-CM | POA: Diagnosis not present

## 2018-12-28 DIAGNOSIS — N2581 Secondary hyperparathyroidism of renal origin: Secondary | ICD-10-CM | POA: Diagnosis not present

## 2018-12-28 DIAGNOSIS — N186 End stage renal disease: Secondary | ICD-10-CM | POA: Diagnosis not present

## 2018-12-29 DIAGNOSIS — N186 End stage renal disease: Secondary | ICD-10-CM | POA: Diagnosis not present

## 2018-12-29 DIAGNOSIS — N2581 Secondary hyperparathyroidism of renal origin: Secondary | ICD-10-CM | POA: Diagnosis not present

## 2018-12-30 DIAGNOSIS — N2581 Secondary hyperparathyroidism of renal origin: Secondary | ICD-10-CM | POA: Diagnosis not present

## 2018-12-30 DIAGNOSIS — N186 End stage renal disease: Secondary | ICD-10-CM | POA: Diagnosis not present

## 2018-12-31 DIAGNOSIS — N2581 Secondary hyperparathyroidism of renal origin: Secondary | ICD-10-CM | POA: Diagnosis not present

## 2018-12-31 DIAGNOSIS — N186 End stage renal disease: Secondary | ICD-10-CM | POA: Diagnosis not present

## 2019-01-01 DIAGNOSIS — N2581 Secondary hyperparathyroidism of renal origin: Secondary | ICD-10-CM | POA: Diagnosis not present

## 2019-01-01 DIAGNOSIS — N186 End stage renal disease: Secondary | ICD-10-CM | POA: Diagnosis not present

## 2019-01-02 DIAGNOSIS — N2581 Secondary hyperparathyroidism of renal origin: Secondary | ICD-10-CM | POA: Diagnosis not present

## 2019-01-02 DIAGNOSIS — N186 End stage renal disease: Secondary | ICD-10-CM | POA: Diagnosis not present

## 2019-01-03 DIAGNOSIS — N2581 Secondary hyperparathyroidism of renal origin: Secondary | ICD-10-CM | POA: Diagnosis not present

## 2019-01-03 DIAGNOSIS — N186 End stage renal disease: Secondary | ICD-10-CM | POA: Diagnosis not present

## 2019-01-04 DIAGNOSIS — N2581 Secondary hyperparathyroidism of renal origin: Secondary | ICD-10-CM | POA: Diagnosis not present

## 2019-01-04 DIAGNOSIS — N186 End stage renal disease: Secondary | ICD-10-CM | POA: Diagnosis not present

## 2019-01-05 DIAGNOSIS — N186 End stage renal disease: Secondary | ICD-10-CM | POA: Diagnosis not present

## 2019-01-05 DIAGNOSIS — N2581 Secondary hyperparathyroidism of renal origin: Secondary | ICD-10-CM | POA: Diagnosis not present

## 2019-01-06 DIAGNOSIS — N2581 Secondary hyperparathyroidism of renal origin: Secondary | ICD-10-CM | POA: Diagnosis not present

## 2019-01-06 DIAGNOSIS — N186 End stage renal disease: Secondary | ICD-10-CM | POA: Diagnosis not present

## 2019-01-07 ENCOUNTER — Other Ambulatory Visit: Payer: Self-pay | Admitting: Nephrology

## 2019-01-07 DIAGNOSIS — N186 End stage renal disease: Secondary | ICD-10-CM | POA: Diagnosis not present

## 2019-01-07 DIAGNOSIS — N2581 Secondary hyperparathyroidism of renal origin: Secondary | ICD-10-CM | POA: Diagnosis not present

## 2019-01-07 DIAGNOSIS — Z01810 Encounter for preprocedural cardiovascular examination: Secondary | ICD-10-CM

## 2019-01-08 DIAGNOSIS — N2581 Secondary hyperparathyroidism of renal origin: Secondary | ICD-10-CM | POA: Diagnosis not present

## 2019-01-08 DIAGNOSIS — N186 End stage renal disease: Secondary | ICD-10-CM | POA: Diagnosis not present

## 2019-01-09 DIAGNOSIS — N186 End stage renal disease: Secondary | ICD-10-CM | POA: Diagnosis not present

## 2019-01-09 DIAGNOSIS — N2581 Secondary hyperparathyroidism of renal origin: Secondary | ICD-10-CM | POA: Diagnosis not present

## 2019-01-10 DIAGNOSIS — N186 End stage renal disease: Secondary | ICD-10-CM | POA: Diagnosis not present

## 2019-01-10 DIAGNOSIS — N2581 Secondary hyperparathyroidism of renal origin: Secondary | ICD-10-CM | POA: Diagnosis not present

## 2019-01-11 DIAGNOSIS — E1022 Type 1 diabetes mellitus with diabetic chronic kidney disease: Secondary | ICD-10-CM | POA: Diagnosis not present

## 2019-01-11 DIAGNOSIS — N2581 Secondary hyperparathyroidism of renal origin: Secondary | ICD-10-CM | POA: Diagnosis not present

## 2019-01-11 DIAGNOSIS — N186 End stage renal disease: Secondary | ICD-10-CM | POA: Diagnosis not present

## 2019-01-11 DIAGNOSIS — Z992 Dependence on renal dialysis: Secondary | ICD-10-CM | POA: Diagnosis not present

## 2019-01-12 DIAGNOSIS — N186 End stage renal disease: Secondary | ICD-10-CM | POA: Diagnosis not present

## 2019-01-12 DIAGNOSIS — N2581 Secondary hyperparathyroidism of renal origin: Secondary | ICD-10-CM | POA: Diagnosis not present

## 2019-01-13 DIAGNOSIS — N2581 Secondary hyperparathyroidism of renal origin: Secondary | ICD-10-CM | POA: Diagnosis not present

## 2019-01-13 DIAGNOSIS — N186 End stage renal disease: Secondary | ICD-10-CM | POA: Diagnosis not present

## 2019-01-14 DIAGNOSIS — N2581 Secondary hyperparathyroidism of renal origin: Secondary | ICD-10-CM | POA: Diagnosis not present

## 2019-01-14 DIAGNOSIS — N186 End stage renal disease: Secondary | ICD-10-CM | POA: Diagnosis not present

## 2019-01-15 DIAGNOSIS — N186 End stage renal disease: Secondary | ICD-10-CM | POA: Diagnosis not present

## 2019-01-15 DIAGNOSIS — N2581 Secondary hyperparathyroidism of renal origin: Secondary | ICD-10-CM | POA: Diagnosis not present

## 2019-01-16 DIAGNOSIS — N186 End stage renal disease: Secondary | ICD-10-CM | POA: Diagnosis not present

## 2019-01-16 DIAGNOSIS — N2581 Secondary hyperparathyroidism of renal origin: Secondary | ICD-10-CM | POA: Diagnosis not present

## 2019-01-17 DIAGNOSIS — N2581 Secondary hyperparathyroidism of renal origin: Secondary | ICD-10-CM | POA: Diagnosis not present

## 2019-01-17 DIAGNOSIS — N186 End stage renal disease: Secondary | ICD-10-CM | POA: Diagnosis not present

## 2019-01-18 DIAGNOSIS — N2581 Secondary hyperparathyroidism of renal origin: Secondary | ICD-10-CM | POA: Diagnosis not present

## 2019-01-18 DIAGNOSIS — N186 End stage renal disease: Secondary | ICD-10-CM | POA: Diagnosis not present

## 2019-01-19 DIAGNOSIS — E103513 Type 1 diabetes mellitus with proliferative diabetic retinopathy with macular edema, bilateral: Secondary | ICD-10-CM | POA: Diagnosis not present

## 2019-01-19 DIAGNOSIS — N2581 Secondary hyperparathyroidism of renal origin: Secondary | ICD-10-CM | POA: Diagnosis not present

## 2019-01-19 DIAGNOSIS — N186 End stage renal disease: Secondary | ICD-10-CM | POA: Diagnosis not present

## 2019-01-20 DIAGNOSIS — N2581 Secondary hyperparathyroidism of renal origin: Secondary | ICD-10-CM | POA: Diagnosis not present

## 2019-01-20 DIAGNOSIS — N186 End stage renal disease: Secondary | ICD-10-CM | POA: Diagnosis not present

## 2019-01-21 DIAGNOSIS — N186 End stage renal disease: Secondary | ICD-10-CM | POA: Diagnosis not present

## 2019-01-21 DIAGNOSIS — N2581 Secondary hyperparathyroidism of renal origin: Secondary | ICD-10-CM | POA: Diagnosis not present

## 2019-01-22 DIAGNOSIS — N186 End stage renal disease: Secondary | ICD-10-CM | POA: Diagnosis not present

## 2019-01-22 DIAGNOSIS — N2581 Secondary hyperparathyroidism of renal origin: Secondary | ICD-10-CM | POA: Diagnosis not present

## 2019-01-23 DIAGNOSIS — N2581 Secondary hyperparathyroidism of renal origin: Secondary | ICD-10-CM | POA: Diagnosis not present

## 2019-01-23 DIAGNOSIS — N186 End stage renal disease: Secondary | ICD-10-CM | POA: Diagnosis not present

## 2019-01-24 DIAGNOSIS — N186 End stage renal disease: Secondary | ICD-10-CM | POA: Diagnosis not present

## 2019-01-24 DIAGNOSIS — N2581 Secondary hyperparathyroidism of renal origin: Secondary | ICD-10-CM | POA: Diagnosis not present

## 2019-01-25 DIAGNOSIS — N2581 Secondary hyperparathyroidism of renal origin: Secondary | ICD-10-CM | POA: Diagnosis not present

## 2019-01-25 DIAGNOSIS — N186 End stage renal disease: Secondary | ICD-10-CM | POA: Diagnosis not present

## 2019-01-26 ENCOUNTER — Other Ambulatory Visit: Payer: Self-pay

## 2019-01-26 ENCOUNTER — Ambulatory Visit (INDEPENDENT_AMBULATORY_CARE_PROVIDER_SITE_OTHER): Payer: Federal, State, Local not specified - PPO

## 2019-01-26 ENCOUNTER — Other Ambulatory Visit: Payer: Federal, State, Local not specified - PPO

## 2019-01-26 DIAGNOSIS — Z01810 Encounter for preprocedural cardiovascular examination: Secondary | ICD-10-CM

## 2019-01-26 DIAGNOSIS — N186 End stage renal disease: Secondary | ICD-10-CM | POA: Diagnosis not present

## 2019-01-26 DIAGNOSIS — N2581 Secondary hyperparathyroidism of renal origin: Secondary | ICD-10-CM | POA: Diagnosis not present

## 2019-01-27 DIAGNOSIS — N2581 Secondary hyperparathyroidism of renal origin: Secondary | ICD-10-CM | POA: Diagnosis not present

## 2019-01-27 DIAGNOSIS — N186 End stage renal disease: Secondary | ICD-10-CM | POA: Diagnosis not present

## 2019-01-28 DIAGNOSIS — N2581 Secondary hyperparathyroidism of renal origin: Secondary | ICD-10-CM | POA: Diagnosis not present

## 2019-01-28 DIAGNOSIS — N186 End stage renal disease: Secondary | ICD-10-CM | POA: Diagnosis not present

## 2019-01-29 DIAGNOSIS — N2581 Secondary hyperparathyroidism of renal origin: Secondary | ICD-10-CM | POA: Diagnosis not present

## 2019-01-29 DIAGNOSIS — N186 End stage renal disease: Secondary | ICD-10-CM | POA: Diagnosis not present

## 2019-01-30 DIAGNOSIS — N186 End stage renal disease: Secondary | ICD-10-CM | POA: Diagnosis not present

## 2019-01-30 DIAGNOSIS — N2581 Secondary hyperparathyroidism of renal origin: Secondary | ICD-10-CM | POA: Diagnosis not present

## 2019-01-31 DIAGNOSIS — N186 End stage renal disease: Secondary | ICD-10-CM | POA: Diagnosis not present

## 2019-01-31 DIAGNOSIS — N2581 Secondary hyperparathyroidism of renal origin: Secondary | ICD-10-CM | POA: Diagnosis not present

## 2019-02-01 DIAGNOSIS — N2581 Secondary hyperparathyroidism of renal origin: Secondary | ICD-10-CM | POA: Diagnosis not present

## 2019-02-01 DIAGNOSIS — N186 End stage renal disease: Secondary | ICD-10-CM | POA: Diagnosis not present

## 2019-02-02 DIAGNOSIS — N2581 Secondary hyperparathyroidism of renal origin: Secondary | ICD-10-CM | POA: Diagnosis not present

## 2019-02-02 DIAGNOSIS — N186 End stage renal disease: Secondary | ICD-10-CM | POA: Diagnosis not present

## 2019-02-03 DIAGNOSIS — N186 End stage renal disease: Secondary | ICD-10-CM | POA: Diagnosis not present

## 2019-02-03 DIAGNOSIS — N2581 Secondary hyperparathyroidism of renal origin: Secondary | ICD-10-CM | POA: Diagnosis not present

## 2019-02-04 DIAGNOSIS — N2581 Secondary hyperparathyroidism of renal origin: Secondary | ICD-10-CM | POA: Diagnosis not present

## 2019-02-04 DIAGNOSIS — N186 End stage renal disease: Secondary | ICD-10-CM | POA: Diagnosis not present

## 2019-02-05 DIAGNOSIS — N2581 Secondary hyperparathyroidism of renal origin: Secondary | ICD-10-CM | POA: Diagnosis not present

## 2019-02-05 DIAGNOSIS — N186 End stage renal disease: Secondary | ICD-10-CM | POA: Diagnosis not present

## 2019-02-06 DIAGNOSIS — N186 End stage renal disease: Secondary | ICD-10-CM | POA: Diagnosis not present

## 2019-02-06 DIAGNOSIS — N2581 Secondary hyperparathyroidism of renal origin: Secondary | ICD-10-CM | POA: Diagnosis not present

## 2019-02-07 DIAGNOSIS — N186 End stage renal disease: Secondary | ICD-10-CM | POA: Diagnosis not present

## 2019-02-07 DIAGNOSIS — N2581 Secondary hyperparathyroidism of renal origin: Secondary | ICD-10-CM | POA: Diagnosis not present

## 2019-02-08 DIAGNOSIS — N186 End stage renal disease: Secondary | ICD-10-CM | POA: Diagnosis not present

## 2019-02-08 DIAGNOSIS — N2581 Secondary hyperparathyroidism of renal origin: Secondary | ICD-10-CM | POA: Diagnosis not present

## 2019-02-09 DIAGNOSIS — N186 End stage renal disease: Secondary | ICD-10-CM | POA: Diagnosis not present

## 2019-02-09 DIAGNOSIS — N2581 Secondary hyperparathyroidism of renal origin: Secondary | ICD-10-CM | POA: Diagnosis not present

## 2019-02-10 DIAGNOSIS — E1022 Type 1 diabetes mellitus with diabetic chronic kidney disease: Secondary | ICD-10-CM | POA: Diagnosis not present

## 2019-02-10 DIAGNOSIS — N2581 Secondary hyperparathyroidism of renal origin: Secondary | ICD-10-CM | POA: Diagnosis not present

## 2019-02-10 DIAGNOSIS — Z992 Dependence on renal dialysis: Secondary | ICD-10-CM | POA: Diagnosis not present

## 2019-02-10 DIAGNOSIS — N186 End stage renal disease: Secondary | ICD-10-CM | POA: Diagnosis not present

## 2019-02-11 DIAGNOSIS — N186 End stage renal disease: Secondary | ICD-10-CM | POA: Diagnosis not present

## 2019-02-11 DIAGNOSIS — N2581 Secondary hyperparathyroidism of renal origin: Secondary | ICD-10-CM | POA: Diagnosis not present

## 2019-02-12 DIAGNOSIS — N186 End stage renal disease: Secondary | ICD-10-CM | POA: Diagnosis not present

## 2019-02-12 DIAGNOSIS — N2581 Secondary hyperparathyroidism of renal origin: Secondary | ICD-10-CM | POA: Diagnosis not present

## 2019-02-13 DIAGNOSIS — N2581 Secondary hyperparathyroidism of renal origin: Secondary | ICD-10-CM | POA: Diagnosis not present

## 2019-02-13 DIAGNOSIS — N186 End stage renal disease: Secondary | ICD-10-CM | POA: Diagnosis not present

## 2019-02-14 DIAGNOSIS — N2581 Secondary hyperparathyroidism of renal origin: Secondary | ICD-10-CM | POA: Diagnosis not present

## 2019-02-14 DIAGNOSIS — N186 End stage renal disease: Secondary | ICD-10-CM | POA: Diagnosis not present

## 2019-02-15 DIAGNOSIS — N2581 Secondary hyperparathyroidism of renal origin: Secondary | ICD-10-CM | POA: Diagnosis not present

## 2019-02-15 DIAGNOSIS — N186 End stage renal disease: Secondary | ICD-10-CM | POA: Diagnosis not present

## 2019-02-15 DIAGNOSIS — R880 Cloudy (hemodialysis) (peritoneal) dialysis effluent: Secondary | ICD-10-CM | POA: Diagnosis not present

## 2019-02-16 DIAGNOSIS — N2581 Secondary hyperparathyroidism of renal origin: Secondary | ICD-10-CM | POA: Diagnosis not present

## 2019-02-16 DIAGNOSIS — N186 End stage renal disease: Secondary | ICD-10-CM | POA: Diagnosis not present

## 2019-02-16 DIAGNOSIS — R880 Cloudy (hemodialysis) (peritoneal) dialysis effluent: Secondary | ICD-10-CM | POA: Diagnosis not present

## 2019-02-17 DIAGNOSIS — R880 Cloudy (hemodialysis) (peritoneal) dialysis effluent: Secondary | ICD-10-CM | POA: Diagnosis not present

## 2019-02-17 DIAGNOSIS — N2581 Secondary hyperparathyroidism of renal origin: Secondary | ICD-10-CM | POA: Diagnosis not present

## 2019-02-17 DIAGNOSIS — N186 End stage renal disease: Secondary | ICD-10-CM | POA: Diagnosis not present

## 2019-02-18 DIAGNOSIS — N186 End stage renal disease: Secondary | ICD-10-CM | POA: Diagnosis not present

## 2019-02-18 DIAGNOSIS — R880 Cloudy (hemodialysis) (peritoneal) dialysis effluent: Secondary | ICD-10-CM | POA: Diagnosis not present

## 2019-02-18 DIAGNOSIS — N2581 Secondary hyperparathyroidism of renal origin: Secondary | ICD-10-CM | POA: Diagnosis not present

## 2019-02-19 DIAGNOSIS — R880 Cloudy (hemodialysis) (peritoneal) dialysis effluent: Secondary | ICD-10-CM | POA: Diagnosis not present

## 2019-02-19 DIAGNOSIS — N2581 Secondary hyperparathyroidism of renal origin: Secondary | ICD-10-CM | POA: Diagnosis not present

## 2019-02-19 DIAGNOSIS — N186 End stage renal disease: Secondary | ICD-10-CM | POA: Diagnosis not present

## 2019-02-19 DIAGNOSIS — K650 Generalized (acute) peritonitis: Secondary | ICD-10-CM | POA: Diagnosis not present

## 2019-02-20 DIAGNOSIS — R880 Cloudy (hemodialysis) (peritoneal) dialysis effluent: Secondary | ICD-10-CM | POA: Diagnosis not present

## 2019-02-20 DIAGNOSIS — N2581 Secondary hyperparathyroidism of renal origin: Secondary | ICD-10-CM | POA: Diagnosis not present

## 2019-02-20 DIAGNOSIS — N186 End stage renal disease: Secondary | ICD-10-CM | POA: Diagnosis not present

## 2019-02-21 DIAGNOSIS — R880 Cloudy (hemodialysis) (peritoneal) dialysis effluent: Secondary | ICD-10-CM | POA: Diagnosis not present

## 2019-02-21 DIAGNOSIS — N186 End stage renal disease: Secondary | ICD-10-CM | POA: Diagnosis not present

## 2019-02-21 DIAGNOSIS — N2581 Secondary hyperparathyroidism of renal origin: Secondary | ICD-10-CM | POA: Diagnosis not present

## 2019-02-22 DIAGNOSIS — N2581 Secondary hyperparathyroidism of renal origin: Secondary | ICD-10-CM | POA: Diagnosis not present

## 2019-02-22 DIAGNOSIS — R880 Cloudy (hemodialysis) (peritoneal) dialysis effluent: Secondary | ICD-10-CM | POA: Diagnosis not present

## 2019-02-22 DIAGNOSIS — N186 End stage renal disease: Secondary | ICD-10-CM | POA: Diagnosis not present

## 2019-02-22 DIAGNOSIS — K650 Generalized (acute) peritonitis: Secondary | ICD-10-CM | POA: Diagnosis not present

## 2019-02-23 DIAGNOSIS — K650 Generalized (acute) peritonitis: Secondary | ICD-10-CM | POA: Diagnosis not present

## 2019-02-23 DIAGNOSIS — R880 Cloudy (hemodialysis) (peritoneal) dialysis effluent: Secondary | ICD-10-CM | POA: Diagnosis not present

## 2019-02-23 DIAGNOSIS — N186 End stage renal disease: Secondary | ICD-10-CM | POA: Diagnosis not present

## 2019-02-23 DIAGNOSIS — N2581 Secondary hyperparathyroidism of renal origin: Secondary | ICD-10-CM | POA: Diagnosis not present

## 2019-02-24 DIAGNOSIS — N2581 Secondary hyperparathyroidism of renal origin: Secondary | ICD-10-CM | POA: Diagnosis not present

## 2019-02-24 DIAGNOSIS — N186 End stage renal disease: Secondary | ICD-10-CM | POA: Diagnosis not present

## 2019-02-24 DIAGNOSIS — R880 Cloudy (hemodialysis) (peritoneal) dialysis effluent: Secondary | ICD-10-CM | POA: Diagnosis not present

## 2019-02-24 DIAGNOSIS — K650 Generalized (acute) peritonitis: Secondary | ICD-10-CM | POA: Diagnosis not present

## 2019-02-25 DIAGNOSIS — K650 Generalized (acute) peritonitis: Secondary | ICD-10-CM | POA: Diagnosis not present

## 2019-02-25 DIAGNOSIS — N186 End stage renal disease: Secondary | ICD-10-CM | POA: Diagnosis not present

## 2019-02-25 DIAGNOSIS — R880 Cloudy (hemodialysis) (peritoneal) dialysis effluent: Secondary | ICD-10-CM | POA: Diagnosis not present

## 2019-02-25 DIAGNOSIS — N2581 Secondary hyperparathyroidism of renal origin: Secondary | ICD-10-CM | POA: Diagnosis not present

## 2019-02-26 DIAGNOSIS — K650 Generalized (acute) peritonitis: Secondary | ICD-10-CM | POA: Diagnosis not present

## 2019-02-26 DIAGNOSIS — N2581 Secondary hyperparathyroidism of renal origin: Secondary | ICD-10-CM | POA: Diagnosis not present

## 2019-02-26 DIAGNOSIS — N186 End stage renal disease: Secondary | ICD-10-CM | POA: Diagnosis not present

## 2019-02-26 DIAGNOSIS — R880 Cloudy (hemodialysis) (peritoneal) dialysis effluent: Secondary | ICD-10-CM | POA: Diagnosis not present

## 2019-02-27 DIAGNOSIS — K650 Generalized (acute) peritonitis: Secondary | ICD-10-CM | POA: Diagnosis not present

## 2019-02-27 DIAGNOSIS — N186 End stage renal disease: Secondary | ICD-10-CM | POA: Diagnosis not present

## 2019-02-27 DIAGNOSIS — R880 Cloudy (hemodialysis) (peritoneal) dialysis effluent: Secondary | ICD-10-CM | POA: Diagnosis not present

## 2019-02-27 DIAGNOSIS — N2581 Secondary hyperparathyroidism of renal origin: Secondary | ICD-10-CM | POA: Diagnosis not present

## 2019-02-28 DIAGNOSIS — K650 Generalized (acute) peritonitis: Secondary | ICD-10-CM | POA: Diagnosis not present

## 2019-02-28 DIAGNOSIS — R880 Cloudy (hemodialysis) (peritoneal) dialysis effluent: Secondary | ICD-10-CM | POA: Diagnosis not present

## 2019-02-28 DIAGNOSIS — N186 End stage renal disease: Secondary | ICD-10-CM | POA: Diagnosis not present

## 2019-02-28 DIAGNOSIS — N2581 Secondary hyperparathyroidism of renal origin: Secondary | ICD-10-CM | POA: Diagnosis not present

## 2019-03-01 DIAGNOSIS — N2581 Secondary hyperparathyroidism of renal origin: Secondary | ICD-10-CM | POA: Diagnosis not present

## 2019-03-01 DIAGNOSIS — N186 End stage renal disease: Secondary | ICD-10-CM | POA: Diagnosis not present

## 2019-03-01 DIAGNOSIS — R880 Cloudy (hemodialysis) (peritoneal) dialysis effluent: Secondary | ICD-10-CM | POA: Diagnosis not present

## 2019-03-02 DIAGNOSIS — R880 Cloudy (hemodialysis) (peritoneal) dialysis effluent: Secondary | ICD-10-CM | POA: Diagnosis not present

## 2019-03-02 DIAGNOSIS — N2581 Secondary hyperparathyroidism of renal origin: Secondary | ICD-10-CM | POA: Diagnosis not present

## 2019-03-02 DIAGNOSIS — N186 End stage renal disease: Secondary | ICD-10-CM | POA: Diagnosis not present

## 2019-03-03 DIAGNOSIS — N2581 Secondary hyperparathyroidism of renal origin: Secondary | ICD-10-CM | POA: Diagnosis not present

## 2019-03-03 DIAGNOSIS — R880 Cloudy (hemodialysis) (peritoneal) dialysis effluent: Secondary | ICD-10-CM | POA: Diagnosis not present

## 2019-03-03 DIAGNOSIS — N186 End stage renal disease: Secondary | ICD-10-CM | POA: Diagnosis not present

## 2019-03-04 DIAGNOSIS — N2581 Secondary hyperparathyroidism of renal origin: Secondary | ICD-10-CM | POA: Diagnosis not present

## 2019-03-04 DIAGNOSIS — R880 Cloudy (hemodialysis) (peritoneal) dialysis effluent: Secondary | ICD-10-CM | POA: Diagnosis not present

## 2019-03-04 DIAGNOSIS — N186 End stage renal disease: Secondary | ICD-10-CM | POA: Diagnosis not present

## 2019-03-05 ENCOUNTER — Telehealth: Payer: Self-pay | Admitting: Gastroenterology

## 2019-03-05 DIAGNOSIS — R634 Abnormal weight loss: Secondary | ICD-10-CM

## 2019-03-05 DIAGNOSIS — R109 Unspecified abdominal pain: Secondary | ICD-10-CM

## 2019-03-05 DIAGNOSIS — R1013 Epigastric pain: Secondary | ICD-10-CM

## 2019-03-05 DIAGNOSIS — R880 Cloudy (hemodialysis) (peritoneal) dialysis effluent: Secondary | ICD-10-CM | POA: Diagnosis not present

## 2019-03-05 DIAGNOSIS — N186 End stage renal disease: Secondary | ICD-10-CM | POA: Diagnosis not present

## 2019-03-05 DIAGNOSIS — R110 Nausea: Secondary | ICD-10-CM

## 2019-03-05 DIAGNOSIS — N2581 Secondary hyperparathyroidism of renal origin: Secondary | ICD-10-CM | POA: Diagnosis not present

## 2019-03-05 DIAGNOSIS — R6881 Early satiety: Secondary | ICD-10-CM

## 2019-03-05 MED ORDER — OMEPRAZOLE 40 MG PO CPDR: 40.0000 mg | DELAYED_RELEASE_CAPSULE | Freq: Every day | ORAL | 3 refills | Status: DC

## 2019-03-05 MED ORDER — DICYCLOMINE HCL 20 MG PO TABS: 20.0000 mg | ORAL_TABLET | Freq: Three times a day (TID) | ORAL | 3 refills | Status: DC

## 2019-03-05 NOTE — Telephone Encounter (Signed)
See recent colon and EGD reports. Miralax daily if he is having any constipation. Dicyclomine 20 mg po tid ac, 3 months of refills. Schedule CT AP. He should check with his nephrologist regarding potential problems related to peritoneal dialysis causing symptoms.

## 2019-03-05 NOTE — Telephone Encounter (Signed)
Patient notified of the recommendations CT scheduled at [facility] for 03/20/19 9:30.  He understands to come pick up contrast and instructions next week. rx sent He will keep his follow up on 8/12 and will check with nephrology for possible causes to the symptoms.

## 2019-03-05 NOTE — Telephone Encounter (Signed)
Patient reports that he is having continued heartburn.  He states he has lost 10 lbs since procedure.  He is not able to eat much at all. He states he sits down to eat and has no appetite at all.  Within 30 minutes of a meal he has a feeling that he has to have a BM but unable to go. Just cramping. He has follow up with you on 8/12.  He is advised to increase his omeprazole to 40 BID until office visit. Does he need anything until OV?

## 2019-03-05 NOTE — Telephone Encounter (Signed)
Pt states that his Hector Mahoney sxs have worsened to the point that he cannot eat anything because he gets stomach pain and acid reflux. He is scheduled to see Dr. Fuller Plan on 8/12 but would like some advise in the meantime.

## 2019-03-06 DIAGNOSIS — N186 End stage renal disease: Secondary | ICD-10-CM | POA: Diagnosis not present

## 2019-03-06 DIAGNOSIS — R880 Cloudy (hemodialysis) (peritoneal) dialysis effluent: Secondary | ICD-10-CM | POA: Diagnosis not present

## 2019-03-06 DIAGNOSIS — N2581 Secondary hyperparathyroidism of renal origin: Secondary | ICD-10-CM | POA: Diagnosis not present

## 2019-03-07 DIAGNOSIS — R880 Cloudy (hemodialysis) (peritoneal) dialysis effluent: Secondary | ICD-10-CM | POA: Diagnosis not present

## 2019-03-07 DIAGNOSIS — N2581 Secondary hyperparathyroidism of renal origin: Secondary | ICD-10-CM | POA: Diagnosis not present

## 2019-03-07 DIAGNOSIS — N186 End stage renal disease: Secondary | ICD-10-CM | POA: Diagnosis not present

## 2019-03-08 DIAGNOSIS — N2581 Secondary hyperparathyroidism of renal origin: Secondary | ICD-10-CM | POA: Diagnosis not present

## 2019-03-08 DIAGNOSIS — N186 End stage renal disease: Secondary | ICD-10-CM | POA: Diagnosis not present

## 2019-03-08 DIAGNOSIS — R880 Cloudy (hemodialysis) (peritoneal) dialysis effluent: Secondary | ICD-10-CM | POA: Diagnosis not present

## 2019-03-09 DIAGNOSIS — N186 End stage renal disease: Secondary | ICD-10-CM | POA: Diagnosis not present

## 2019-03-09 DIAGNOSIS — N2581 Secondary hyperparathyroidism of renal origin: Secondary | ICD-10-CM | POA: Diagnosis not present

## 2019-03-09 DIAGNOSIS — R880 Cloudy (hemodialysis) (peritoneal) dialysis effluent: Secondary | ICD-10-CM | POA: Diagnosis not present

## 2019-03-10 DIAGNOSIS — R880 Cloudy (hemodialysis) (peritoneal) dialysis effluent: Secondary | ICD-10-CM | POA: Diagnosis not present

## 2019-03-10 DIAGNOSIS — N186 End stage renal disease: Secondary | ICD-10-CM | POA: Diagnosis not present

## 2019-03-10 DIAGNOSIS — N2581 Secondary hyperparathyroidism of renal origin: Secondary | ICD-10-CM | POA: Diagnosis not present

## 2019-03-11 DIAGNOSIS — N186 End stage renal disease: Secondary | ICD-10-CM | POA: Diagnosis not present

## 2019-03-11 DIAGNOSIS — N2581 Secondary hyperparathyroidism of renal origin: Secondary | ICD-10-CM | POA: Diagnosis not present

## 2019-03-11 DIAGNOSIS — R880 Cloudy (hemodialysis) (peritoneal) dialysis effluent: Secondary | ICD-10-CM | POA: Diagnosis not present

## 2019-03-12 DIAGNOSIS — N2581 Secondary hyperparathyroidism of renal origin: Secondary | ICD-10-CM | POA: Diagnosis not present

## 2019-03-12 DIAGNOSIS — R880 Cloudy (hemodialysis) (peritoneal) dialysis effluent: Secondary | ICD-10-CM | POA: Diagnosis not present

## 2019-03-12 DIAGNOSIS — N186 End stage renal disease: Secondary | ICD-10-CM | POA: Diagnosis not present

## 2019-03-13 DIAGNOSIS — R880 Cloudy (hemodialysis) (peritoneal) dialysis effluent: Secondary | ICD-10-CM | POA: Diagnosis not present

## 2019-03-13 DIAGNOSIS — E119 Type 2 diabetes mellitus without complications: Secondary | ICD-10-CM | POA: Diagnosis not present

## 2019-03-13 DIAGNOSIS — Z452 Encounter for adjustment and management of vascular access device: Secondary | ICD-10-CM | POA: Diagnosis not present

## 2019-03-13 DIAGNOSIS — E1022 Type 1 diabetes mellitus with diabetic chronic kidney disease: Secondary | ICD-10-CM | POA: Diagnosis not present

## 2019-03-13 DIAGNOSIS — N2581 Secondary hyperparathyroidism of renal origin: Secondary | ICD-10-CM | POA: Diagnosis not present

## 2019-03-13 DIAGNOSIS — Z992 Dependence on renal dialysis: Secondary | ICD-10-CM | POA: Diagnosis not present

## 2019-03-13 DIAGNOSIS — N186 End stage renal disease: Secondary | ICD-10-CM | POA: Diagnosis not present

## 2019-03-14 DIAGNOSIS — N2581 Secondary hyperparathyroidism of renal origin: Secondary | ICD-10-CM | POA: Diagnosis not present

## 2019-03-14 DIAGNOSIS — Z992 Dependence on renal dialysis: Secondary | ICD-10-CM | POA: Diagnosis not present

## 2019-03-14 DIAGNOSIS — N186 End stage renal disease: Secondary | ICD-10-CM | POA: Diagnosis not present

## 2019-03-15 DIAGNOSIS — N186 End stage renal disease: Secondary | ICD-10-CM | POA: Diagnosis not present

## 2019-03-15 DIAGNOSIS — Z992 Dependence on renal dialysis: Secondary | ICD-10-CM | POA: Diagnosis not present

## 2019-03-15 DIAGNOSIS — N2581 Secondary hyperparathyroidism of renal origin: Secondary | ICD-10-CM | POA: Diagnosis not present

## 2019-03-16 DIAGNOSIS — H35033 Hypertensive retinopathy, bilateral: Secondary | ICD-10-CM | POA: Diagnosis not present

## 2019-03-16 DIAGNOSIS — Z1159 Encounter for screening for other viral diseases: Secondary | ICD-10-CM | POA: Diagnosis not present

## 2019-03-16 DIAGNOSIS — H3582 Retinal ischemia: Secondary | ICD-10-CM | POA: Diagnosis not present

## 2019-03-16 DIAGNOSIS — E103513 Type 1 diabetes mellitus with proliferative diabetic retinopathy with macular edema, bilateral: Secondary | ICD-10-CM | POA: Diagnosis not present

## 2019-03-18 DIAGNOSIS — N2581 Secondary hyperparathyroidism of renal origin: Secondary | ICD-10-CM | POA: Diagnosis not present

## 2019-03-18 DIAGNOSIS — T8249XA Other complication of vascular dialysis catheter, initial encounter: Secondary | ICD-10-CM | POA: Diagnosis not present

## 2019-03-18 DIAGNOSIS — N186 End stage renal disease: Secondary | ICD-10-CM | POA: Diagnosis not present

## 2019-03-18 DIAGNOSIS — Z992 Dependence on renal dialysis: Secondary | ICD-10-CM | POA: Diagnosis not present

## 2019-03-19 DIAGNOSIS — N186 End stage renal disease: Secondary | ICD-10-CM | POA: Diagnosis not present

## 2019-03-19 DIAGNOSIS — Z4902 Encounter for fitting and adjustment of peritoneal dialysis catheter: Secondary | ICD-10-CM | POA: Diagnosis not present

## 2019-03-19 DIAGNOSIS — Y839 Surgical procedure, unspecified as the cause of abnormal reaction of the patient, or of later complication, without mention of misadventure at the time of the procedure: Secondary | ICD-10-CM | POA: Diagnosis not present

## 2019-03-19 DIAGNOSIS — B379 Candidiasis, unspecified: Secondary | ICD-10-CM | POA: Diagnosis not present

## 2019-03-19 DIAGNOSIS — Z452 Encounter for adjustment and management of vascular access device: Secondary | ICD-10-CM | POA: Diagnosis not present

## 2019-03-19 DIAGNOSIS — Z992 Dependence on renal dialysis: Secondary | ICD-10-CM | POA: Diagnosis not present

## 2019-03-19 DIAGNOSIS — K659 Peritonitis, unspecified: Secondary | ICD-10-CM | POA: Diagnosis not present

## 2019-03-19 DIAGNOSIS — T8571XA Infection and inflammatory reaction due to peritoneal dialysis catheter, initial encounter: Secondary | ICD-10-CM | POA: Diagnosis not present

## 2019-03-19 DIAGNOSIS — I120 Hypertensive chronic kidney disease with stage 5 chronic kidney disease or end stage renal disease: Secondary | ICD-10-CM | POA: Diagnosis not present

## 2019-03-19 DIAGNOSIS — E1122 Type 2 diabetes mellitus with diabetic chronic kidney disease: Secondary | ICD-10-CM | POA: Diagnosis not present

## 2019-03-19 DIAGNOSIS — Z794 Long term (current) use of insulin: Secondary | ICD-10-CM | POA: Diagnosis not present

## 2019-03-20 ENCOUNTER — Inpatient Hospital Stay: Admission: RE | Admit: 2019-03-20 | Payer: Federal, State, Local not specified - PPO | Source: Ambulatory Visit

## 2019-03-20 DIAGNOSIS — R000 Tachycardia, unspecified: Secondary | ICD-10-CM | POA: Diagnosis not present

## 2019-03-20 DIAGNOSIS — Z992 Dependence on renal dialysis: Secondary | ICD-10-CM | POA: Diagnosis not present

## 2019-03-20 DIAGNOSIS — T8249XA Other complication of vascular dialysis catheter, initial encounter: Secondary | ICD-10-CM | POA: Diagnosis not present

## 2019-03-20 DIAGNOSIS — I493 Ventricular premature depolarization: Secondary | ICD-10-CM | POA: Diagnosis not present

## 2019-03-20 DIAGNOSIS — N186 End stage renal disease: Secondary | ICD-10-CM | POA: Diagnosis not present

## 2019-03-20 DIAGNOSIS — R9431 Abnormal electrocardiogram [ECG] [EKG]: Secondary | ICD-10-CM | POA: Diagnosis not present

## 2019-03-20 DIAGNOSIS — N2581 Secondary hyperparathyroidism of renal origin: Secondary | ICD-10-CM | POA: Diagnosis not present

## 2019-03-23 DIAGNOSIS — N186 End stage renal disease: Secondary | ICD-10-CM | POA: Diagnosis not present

## 2019-03-23 DIAGNOSIS — T8249XA Other complication of vascular dialysis catheter, initial encounter: Secondary | ICD-10-CM | POA: Diagnosis not present

## 2019-03-23 DIAGNOSIS — Z992 Dependence on renal dialysis: Secondary | ICD-10-CM | POA: Diagnosis not present

## 2019-03-23 DIAGNOSIS — N2581 Secondary hyperparathyroidism of renal origin: Secondary | ICD-10-CM | POA: Diagnosis not present

## 2019-03-25 ENCOUNTER — Telehealth: Payer: Self-pay | Admitting: Gastroenterology

## 2019-03-25 ENCOUNTER — Ambulatory Visit: Payer: Federal, State, Local not specified - PPO | Admitting: Gastroenterology

## 2019-03-25 DIAGNOSIS — Z992 Dependence on renal dialysis: Secondary | ICD-10-CM | POA: Diagnosis not present

## 2019-03-25 DIAGNOSIS — N186 End stage renal disease: Secondary | ICD-10-CM | POA: Diagnosis not present

## 2019-03-25 DIAGNOSIS — T8249XA Other complication of vascular dialysis catheter, initial encounter: Secondary | ICD-10-CM | POA: Diagnosis not present

## 2019-03-25 DIAGNOSIS — N2581 Secondary hyperparathyroidism of renal origin: Secondary | ICD-10-CM | POA: Diagnosis not present

## 2019-03-25 NOTE — Telephone Encounter (Signed)
Pt would like to reschedule CT.

## 2019-03-25 NOTE — Telephone Encounter (Signed)
Left message for patient to call back CT has been rescheduled for 04/21/19 1:30

## 2019-03-26 DIAGNOSIS — E103513 Type 1 diabetes mellitus with proliferative diabetic retinopathy with macular edema, bilateral: Secondary | ICD-10-CM | POA: Diagnosis not present

## 2019-03-26 DIAGNOSIS — H3582 Retinal ischemia: Secondary | ICD-10-CM | POA: Diagnosis not present

## 2019-03-26 DIAGNOSIS — H2513 Age-related nuclear cataract, bilateral: Secondary | ICD-10-CM | POA: Diagnosis not present

## 2019-03-27 DIAGNOSIS — Z992 Dependence on renal dialysis: Secondary | ICD-10-CM | POA: Diagnosis not present

## 2019-03-27 DIAGNOSIS — T8249XA Other complication of vascular dialysis catheter, initial encounter: Secondary | ICD-10-CM | POA: Diagnosis not present

## 2019-03-27 DIAGNOSIS — N2581 Secondary hyperparathyroidism of renal origin: Secondary | ICD-10-CM | POA: Diagnosis not present

## 2019-03-27 DIAGNOSIS — N186 End stage renal disease: Secondary | ICD-10-CM | POA: Diagnosis not present

## 2019-03-27 NOTE — Telephone Encounter (Signed)
Patient notified of the reschedule date.  He will come and pick up contrast and instructions.

## 2019-03-30 DIAGNOSIS — N2581 Secondary hyperparathyroidism of renal origin: Secondary | ICD-10-CM | POA: Diagnosis not present

## 2019-03-30 DIAGNOSIS — N186 End stage renal disease: Secondary | ICD-10-CM | POA: Diagnosis not present

## 2019-03-30 DIAGNOSIS — Z992 Dependence on renal dialysis: Secondary | ICD-10-CM | POA: Diagnosis not present

## 2019-03-30 DIAGNOSIS — Z7682 Awaiting organ transplant status: Secondary | ICD-10-CM | POA: Diagnosis not present

## 2019-03-30 DIAGNOSIS — T8249XA Other complication of vascular dialysis catheter, initial encounter: Secondary | ICD-10-CM | POA: Diagnosis not present

## 2019-04-01 DIAGNOSIS — T8249XA Other complication of vascular dialysis catheter, initial encounter: Secondary | ICD-10-CM | POA: Diagnosis not present

## 2019-04-01 DIAGNOSIS — Z992 Dependence on renal dialysis: Secondary | ICD-10-CM | POA: Diagnosis not present

## 2019-04-01 DIAGNOSIS — N186 End stage renal disease: Secondary | ICD-10-CM | POA: Diagnosis not present

## 2019-04-01 DIAGNOSIS — N2581 Secondary hyperparathyroidism of renal origin: Secondary | ICD-10-CM | POA: Diagnosis not present

## 2019-04-03 DIAGNOSIS — N186 End stage renal disease: Secondary | ICD-10-CM | POA: Diagnosis not present

## 2019-04-03 DIAGNOSIS — T8249XA Other complication of vascular dialysis catheter, initial encounter: Secondary | ICD-10-CM | POA: Diagnosis not present

## 2019-04-03 DIAGNOSIS — N2581 Secondary hyperparathyroidism of renal origin: Secondary | ICD-10-CM | POA: Diagnosis not present

## 2019-04-03 DIAGNOSIS — Z992 Dependence on renal dialysis: Secondary | ICD-10-CM | POA: Diagnosis not present

## 2019-04-06 DIAGNOSIS — N2581 Secondary hyperparathyroidism of renal origin: Secondary | ICD-10-CM | POA: Diagnosis not present

## 2019-04-06 DIAGNOSIS — Z992 Dependence on renal dialysis: Secondary | ICD-10-CM | POA: Diagnosis not present

## 2019-04-06 DIAGNOSIS — N186 End stage renal disease: Secondary | ICD-10-CM | POA: Diagnosis not present

## 2019-04-06 DIAGNOSIS — T8249XA Other complication of vascular dialysis catheter, initial encounter: Secondary | ICD-10-CM | POA: Diagnosis not present

## 2019-04-08 DIAGNOSIS — N186 End stage renal disease: Secondary | ICD-10-CM | POA: Diagnosis not present

## 2019-04-08 DIAGNOSIS — T8249XA Other complication of vascular dialysis catheter, initial encounter: Secondary | ICD-10-CM | POA: Diagnosis not present

## 2019-04-08 DIAGNOSIS — N2581 Secondary hyperparathyroidism of renal origin: Secondary | ICD-10-CM | POA: Diagnosis not present

## 2019-04-08 DIAGNOSIS — Z992 Dependence on renal dialysis: Secondary | ICD-10-CM | POA: Diagnosis not present

## 2019-04-10 DIAGNOSIS — N186 End stage renal disease: Secondary | ICD-10-CM | POA: Diagnosis not present

## 2019-04-10 DIAGNOSIS — N2581 Secondary hyperparathyroidism of renal origin: Secondary | ICD-10-CM | POA: Diagnosis not present

## 2019-04-10 DIAGNOSIS — Z992 Dependence on renal dialysis: Secondary | ICD-10-CM | POA: Diagnosis not present

## 2019-04-10 DIAGNOSIS — T8249XA Other complication of vascular dialysis catheter, initial encounter: Secondary | ICD-10-CM | POA: Diagnosis not present

## 2019-04-13 DIAGNOSIS — N2581 Secondary hyperparathyroidism of renal origin: Secondary | ICD-10-CM | POA: Diagnosis not present

## 2019-04-13 DIAGNOSIS — E1022 Type 1 diabetes mellitus with diabetic chronic kidney disease: Secondary | ICD-10-CM | POA: Diagnosis not present

## 2019-04-13 DIAGNOSIS — N186 End stage renal disease: Secondary | ICD-10-CM | POA: Diagnosis not present

## 2019-04-13 DIAGNOSIS — Z992 Dependence on renal dialysis: Secondary | ICD-10-CM | POA: Diagnosis not present

## 2019-04-13 DIAGNOSIS — T8249XA Other complication of vascular dialysis catheter, initial encounter: Secondary | ICD-10-CM | POA: Diagnosis not present

## 2019-04-15 DIAGNOSIS — N2581 Secondary hyperparathyroidism of renal origin: Secondary | ICD-10-CM | POA: Diagnosis not present

## 2019-04-15 DIAGNOSIS — Z992 Dependence on renal dialysis: Secondary | ICD-10-CM | POA: Diagnosis not present

## 2019-04-15 DIAGNOSIS — N186 End stage renal disease: Secondary | ICD-10-CM | POA: Diagnosis not present

## 2019-04-15 DIAGNOSIS — T8249XA Other complication of vascular dialysis catheter, initial encounter: Secondary | ICD-10-CM | POA: Diagnosis not present

## 2019-04-16 DIAGNOSIS — N186 End stage renal disease: Secondary | ICD-10-CM | POA: Diagnosis not present

## 2019-04-16 DIAGNOSIS — Z992 Dependence on renal dialysis: Secondary | ICD-10-CM | POA: Diagnosis not present

## 2019-04-16 DIAGNOSIS — T8249XA Other complication of vascular dialysis catheter, initial encounter: Secondary | ICD-10-CM | POA: Diagnosis not present

## 2019-04-16 DIAGNOSIS — N2581 Secondary hyperparathyroidism of renal origin: Secondary | ICD-10-CM | POA: Diagnosis not present

## 2019-04-20 DIAGNOSIS — T8249XA Other complication of vascular dialysis catheter, initial encounter: Secondary | ICD-10-CM | POA: Diagnosis not present

## 2019-04-20 DIAGNOSIS — N186 End stage renal disease: Secondary | ICD-10-CM | POA: Diagnosis not present

## 2019-04-20 DIAGNOSIS — Z992 Dependence on renal dialysis: Secondary | ICD-10-CM | POA: Diagnosis not present

## 2019-04-20 DIAGNOSIS — N2581 Secondary hyperparathyroidism of renal origin: Secondary | ICD-10-CM | POA: Diagnosis not present

## 2019-04-21 ENCOUNTER — Inpatient Hospital Stay: Admission: RE | Admit: 2019-04-21 | Payer: Federal, State, Local not specified - PPO | Source: Ambulatory Visit

## 2019-04-22 DIAGNOSIS — T8249XA Other complication of vascular dialysis catheter, initial encounter: Secondary | ICD-10-CM | POA: Diagnosis not present

## 2019-04-22 DIAGNOSIS — N2581 Secondary hyperparathyroidism of renal origin: Secondary | ICD-10-CM | POA: Diagnosis not present

## 2019-04-22 DIAGNOSIS — N186 End stage renal disease: Secondary | ICD-10-CM | POA: Diagnosis not present

## 2019-04-22 DIAGNOSIS — Z992 Dependence on renal dialysis: Secondary | ICD-10-CM | POA: Diagnosis not present

## 2019-04-24 DIAGNOSIS — T8249XA Other complication of vascular dialysis catheter, initial encounter: Secondary | ICD-10-CM | POA: Diagnosis not present

## 2019-04-24 DIAGNOSIS — N186 End stage renal disease: Secondary | ICD-10-CM | POA: Diagnosis not present

## 2019-04-24 DIAGNOSIS — N2581 Secondary hyperparathyroidism of renal origin: Secondary | ICD-10-CM | POA: Diagnosis not present

## 2019-04-24 DIAGNOSIS — Z992 Dependence on renal dialysis: Secondary | ICD-10-CM | POA: Diagnosis not present

## 2019-04-27 DIAGNOSIS — N186 End stage renal disease: Secondary | ICD-10-CM | POA: Diagnosis not present

## 2019-04-27 DIAGNOSIS — Z992 Dependence on renal dialysis: Secondary | ICD-10-CM | POA: Diagnosis not present

## 2019-04-27 DIAGNOSIS — N2581 Secondary hyperparathyroidism of renal origin: Secondary | ICD-10-CM | POA: Diagnosis not present

## 2019-04-27 DIAGNOSIS — T8249XA Other complication of vascular dialysis catheter, initial encounter: Secondary | ICD-10-CM | POA: Diagnosis not present

## 2019-04-29 DIAGNOSIS — N2581 Secondary hyperparathyroidism of renal origin: Secondary | ICD-10-CM | POA: Diagnosis not present

## 2019-04-29 DIAGNOSIS — T8249XA Other complication of vascular dialysis catheter, initial encounter: Secondary | ICD-10-CM | POA: Diagnosis not present

## 2019-04-29 DIAGNOSIS — Z992 Dependence on renal dialysis: Secondary | ICD-10-CM | POA: Diagnosis not present

## 2019-04-29 DIAGNOSIS — N186 End stage renal disease: Secondary | ICD-10-CM | POA: Diagnosis not present

## 2019-05-01 DIAGNOSIS — T8249XA Other complication of vascular dialysis catheter, initial encounter: Secondary | ICD-10-CM | POA: Diagnosis not present

## 2019-05-01 DIAGNOSIS — N186 End stage renal disease: Secondary | ICD-10-CM | POA: Diagnosis not present

## 2019-05-01 DIAGNOSIS — Z992 Dependence on renal dialysis: Secondary | ICD-10-CM | POA: Diagnosis not present

## 2019-05-01 DIAGNOSIS — N2581 Secondary hyperparathyroidism of renal origin: Secondary | ICD-10-CM | POA: Diagnosis not present

## 2019-05-04 DIAGNOSIS — Z992 Dependence on renal dialysis: Secondary | ICD-10-CM | POA: Diagnosis not present

## 2019-05-04 DIAGNOSIS — N2581 Secondary hyperparathyroidism of renal origin: Secondary | ICD-10-CM | POA: Diagnosis not present

## 2019-05-04 DIAGNOSIS — N186 End stage renal disease: Secondary | ICD-10-CM | POA: Diagnosis not present

## 2019-05-04 DIAGNOSIS — T8249XA Other complication of vascular dialysis catheter, initial encounter: Secondary | ICD-10-CM | POA: Diagnosis not present

## 2019-05-06 DIAGNOSIS — N2581 Secondary hyperparathyroidism of renal origin: Secondary | ICD-10-CM | POA: Diagnosis not present

## 2019-05-06 DIAGNOSIS — T8249XA Other complication of vascular dialysis catheter, initial encounter: Secondary | ICD-10-CM | POA: Diagnosis not present

## 2019-05-06 DIAGNOSIS — Z992 Dependence on renal dialysis: Secondary | ICD-10-CM | POA: Diagnosis not present

## 2019-05-06 DIAGNOSIS — N186 End stage renal disease: Secondary | ICD-10-CM | POA: Diagnosis not present

## 2019-05-07 ENCOUNTER — Ambulatory Visit: Payer: Federal, State, Local not specified - PPO | Admitting: Gastroenterology

## 2019-05-08 DIAGNOSIS — N2581 Secondary hyperparathyroidism of renal origin: Secondary | ICD-10-CM | POA: Diagnosis not present

## 2019-05-08 DIAGNOSIS — N186 End stage renal disease: Secondary | ICD-10-CM | POA: Diagnosis not present

## 2019-05-08 DIAGNOSIS — T8249XA Other complication of vascular dialysis catheter, initial encounter: Secondary | ICD-10-CM | POA: Diagnosis not present

## 2019-05-08 DIAGNOSIS — Z992 Dependence on renal dialysis: Secondary | ICD-10-CM | POA: Diagnosis not present

## 2019-05-11 ENCOUNTER — Telehealth (HOSPITAL_COMMUNITY): Payer: Self-pay | Admitting: *Deleted

## 2019-05-11 ENCOUNTER — Other Ambulatory Visit: Payer: Self-pay

## 2019-05-11 DIAGNOSIS — T8249XA Other complication of vascular dialysis catheter, initial encounter: Secondary | ICD-10-CM | POA: Diagnosis not present

## 2019-05-11 DIAGNOSIS — N186 End stage renal disease: Secondary | ICD-10-CM | POA: Diagnosis not present

## 2019-05-11 DIAGNOSIS — Z992 Dependence on renal dialysis: Secondary | ICD-10-CM | POA: Diagnosis not present

## 2019-05-11 DIAGNOSIS — N2581 Secondary hyperparathyroidism of renal origin: Secondary | ICD-10-CM | POA: Diagnosis not present

## 2019-05-11 DIAGNOSIS — E1022 Type 1 diabetes mellitus with diabetic chronic kidney disease: Secondary | ICD-10-CM

## 2019-05-11 NOTE — Telephone Encounter (Signed)
The above patient or their representative was contacted and gave the following answers to these questions:         Do you have any of the following symptoms?n  Fever                    Cough                   Shortness of breath  Do  you have any of the following other symptoms? n   muscle pain         vomiting,        diarrhea        rash         weakness        red eye        abdominal pain         bruising          bruising or bleeding              joint pain           severe headache    Have you been in contact with someone who was or has been sick in the past 2 weeks?n Yes                 Unsure                         Unable to assess   Does the person that you were in contact with have any of the following symptoms?   Cough         shortness of breath           muscle pain         vomiting,            diarrhea            rash            weakness           fever            red eye           abdominal pain           bruising  or  bleeding                joint pain                severe headache               Have you  or someone you have been in contact with traveled internationally in th last month?         If yes, which countries?   Have you  or someone you have been in contact with traveled outside [place] in th last month?         If yes, which state and city?   COMMENTS OR ACTION PLAN FOR THIS PATIENT:         qu

## 2019-05-12 ENCOUNTER — Ambulatory Visit (INDEPENDENT_AMBULATORY_CARE_PROVIDER_SITE_OTHER): Payer: Federal, State, Local not specified - PPO | Admitting: Vascular Surgery

## 2019-05-12 ENCOUNTER — Ambulatory Visit (INDEPENDENT_AMBULATORY_CARE_PROVIDER_SITE_OTHER)
Admission: RE | Admit: 2019-05-12 | Discharge: 2019-05-12 | Disposition: A | Discharge status: Home / Self Care | Payer: Federal, State, Local not specified - PPO | Source: Ambulatory Visit | Attending: Vascular Surgery | Admitting: Vascular Surgery

## 2019-05-12 ENCOUNTER — Encounter: Payer: Self-pay | Admitting: *Deleted

## 2019-05-12 ENCOUNTER — Ambulatory Visit (HOSPITAL_COMMUNITY)
Admission: RE | Admit: 2019-05-12 | Discharge: 2019-05-12 | Disposition: A | Discharge status: Home / Self Care | Payer: Federal, State, Local not specified - PPO | Source: Ambulatory Visit | Attending: Vascular Surgery | Admitting: Vascular Surgery

## 2019-05-12 ENCOUNTER — Other Ambulatory Visit: Payer: Self-pay | Admitting: *Deleted

## 2019-05-12 ENCOUNTER — Other Ambulatory Visit: Payer: Self-pay

## 2019-05-12 ENCOUNTER — Encounter: Payer: Self-pay | Admitting: Vascular Surgery

## 2019-05-12 VITALS — BP 121/82 | HR 100 | Temp 97.8°F | Resp 20 | Ht 66.0 in | Wt 195.6 lb

## 2019-05-12 DIAGNOSIS — N186 End stage renal disease: Secondary | ICD-10-CM

## 2019-05-12 DIAGNOSIS — E1022 Type 1 diabetes mellitus with diabetic chronic kidney disease: Secondary | ICD-10-CM | POA: Diagnosis not present

## 2019-05-12 DIAGNOSIS — N185 Chronic kidney disease, stage 5: Secondary | ICD-10-CM | POA: Insufficient documentation

## 2019-05-12 DIAGNOSIS — Z992 Dependence on renal dialysis: Secondary | ICD-10-CM | POA: Diagnosis not present

## 2019-05-12 NOTE — H&P (View-Only) (Signed)
V[facility] and [facility]in Specialist of [place]  Patient name: Hector Mahoney MRN: [id number] DOB: 05/28/1971 Sex: male  REASON FOR CONSULT: Discuss access for hemodialysis  HPI: Hector Mahoney is a 48 y.o. male, who is here today for discussion of access for hemodialysis.  He had a tunneled hemodialysis catheter placed at initiation of dialysis in March 2020.  He is never had arm access.  Fortunately he has had no evidence of infection of his catheter and has had good use and good dialysis runs.  He is right-handed.  He does not have a pacemaker.  Past Medical History:  Diagnosis Date  . ABSCESS, TOOTH 04/23/2009  . AKI (acute kidney injury) ([place]) 08/2015  . Anemia   . CHF (congestive heart failure) ([place])   . Chronic kidney disease   . DIABETES MELLITUS, TYPE I 07/30/2007  . Esophageal reflux 04/27/2009  . FATIGUE 04/27/2009  . HYPERLIPIDEMIA 04/24/2007  . HYPERTENSION 04/24/2007  . INSOMNIA-SLEEP DISORDER-UNSPEC 04/27/2009  . LUMBAR STRAIN, ACUTE 12/11/2008  . RASH-NONVESICULAR 03/11/2008  . Shortness of breath    "lying down and w/exertion right now" (12/22/2012)    Family History  Problem Relation Age of Onset  . Hypertension Mother   . Diabetes Mother   . Diabetes Maternal Aunt   . Lung cancer Maternal Grandfather   . Colon polyps Maternal Uncle   . Colon cancer Maternal Uncle   . Heart disease Maternal Uncle   . Kidney disease Neg Hx   . Esophageal cancer Neg Hx   . Rectal cancer Neg Hx   . Stomach cancer Neg Hx     SOCIAL HISTORY: Social History   Socioeconomic History  . Marital status: Married    Spouse name: Not on file  . Number of children: 0  . Years of education: Not on file  . Highest education level: Not on file  Occupational History  . Not on file  Social Needs  . Financial resource strain: Not on file  . Food insecurity    Worry: Not on file    Inability: Not on file  . Transportation needs    Medical: Not on  file    Non-medical: Not on file  Tobacco Use  . Smoking status: Former Smoker    Types: Cigarettes    Quit date: 05/03/2015    Years since quitting: 4.0  . Smokeless tobacco: Never Used  . Tobacco comment: 12/22/2012 "used to smoke cigarettes once or twice/month; haven't had any cigarettes for years"  Substance and Sexual Activity  . Alcohol use: Yes    Comment: occ  . Drug use: No  . Sexual activity: Yes  Lifestyle  . Physical activity    Days per week: Not on file    Minutes per session: Not on file  . Stress: Not on file  Relationships  . Social Herbalist on phone: Not on file    Gets together: Not on file    Attends religious service: Not on file    Active member of club or organization: Not on file    Attends meetings of clubs or organizations: Not on file    Relationship status: Not on file  . Intimate partner violence    Fear of current or ex partner: Not on file    Emotionally abused: Not on file    Physically abused: Not on file    Forced sexual activity: Not on file  Other Topics Concern  . Not on file  Social History Narrative  . Not on file    No Known Allergies  Current Outpatient Medications  Medication Sig Dispense Refill  . amLODipine (NORVASC) 5 MG tablet Take 1 tablet (5 mg total) by mouth daily. 30 tablet 1  . aspirin EC 81 MG EC tablet Take 1 tablet (81 mg total) by mouth daily. 30 tablet 0  . calcitRIOL (ROCALTROL) 0.25 MCG capsule Take 1 capsule (0.25 mcg total) by mouth every Monday, Wednesday, and Friday at 6 PM. 15 capsule 0  . calcium acetate (PHOSLO) 667 MG capsule Take 2 capsules (1,334 mg total) by mouth 3 (three) times daily with meals. (Patient taking differently: Take 1,334 mg by mouth 3 (three) times daily with meals. ) 180 capsule 3  . Calcium Carbonate Antacid (TUMS SMOOTHIES PO) Take by mouth as needed.    . cinacalcet (SENSIPAR) 60 MG tablet Take 60 mg by mouth daily.    Marland Kitchen dicyclomine (BENTYL) 20 MG tablet Take 1 tablet  (20 mg total) by mouth 3 (three) times daily before meals. 90 tablet 3  . ferrous sulfate 325 (65 FE) MG tablet Take 1 tablet (325 mg total) by mouth 2 (two) times daily with a meal. 60 tablet 0  . insulin aspart (NOVOLOG) 100 UNIT/ML injection Inject 8-12 Units into the skin 3 (three) times daily with meals. 2 vial 5  . insulin glargine (LANTUS) 100 UNIT/ML injection Inject 0.25 mLs (25 Units total) into the skin at bedtime. 20 mL 5  . isosorbide mononitrate (IMDUR) 30 MG 24 hr tablet TAKE ONE-HALF TABLET BY MOUTH ONCE DAILY (Patient taking differently: TAKE 15 mg  TABLET BY MOUTH ONCE DAILY) 90 tablet 1  . Menthol-Methyl Salicylate (MUSCLE RUB) 10-15 % CREA Apply 1 application topically as needed for muscle pain (for leg cramps). 35 g 1  . omeprazole (PRILOSEC) 40 MG capsule Take 1 capsule (40 mg total) by mouth daily. 60 capsule 3  . rOPINIRole (REQUIP) 0.25 MG tablet TAKE 1 TABLET BY MOUTH AT BEDTIME 55 tablet 2  . rosuvastatin (CRESTOR) 10 MG tablet Take 1 tablet (10 mg total) by mouth daily. 30 tablet 3   No current facility-administered medications for this visit.     REVIEW OF SYSTEMS:  [X]  denotes positive finding, [ ]  denotes negative finding Cardiac  Comments:  Chest pain or chest pressure:    Shortness of breath upon exertion:    Short of breath when lying flat:    Irregular heart rhythm:        Vascular    Pain in calf, thigh, or hip brought on by ambulation:    Pain in feet at night that wakes you up from your sleep:     Blood clot in your veins:    Leg swelling:         Pulmonary    Oxygen at home:    Productive cough:     Wheezing:         Neurologic    Sudden weakness in arms or legs:     Sudden numbness in arms or legs:     Sudden onset of difficulty speaking or slurred speech:    Temporary loss of vision in one eye:     Problems with dizziness:         Gastrointestinal    Blood in stool:     Vomited blood:         Genitourinary    Burning when urinating:      Blood in urine:  Psychiatric    Major depression:         Hematologic    Bleeding problems:    Problems with blood clotting too easily:        Skin    Rashes or ulcers:        Constitutional    Fever or chills:      PHYSICAL EXAM: Vitals:   05/12/19 0919  BP: 121/82  Pulse: 100  Resp: 20  Temp: 97.8 F (36.6 C)  SpO2: 97%  Weight: 195 lb 9.6 oz (88.7 kg)  Height: 5\' 6"  (1.676 m)    GENERAL: The patient is a well-nourished male, in no acute distress. The vital signs are documented above. CARDIOVASCULAR: 2+ radial pulses bilaterally.  Does not have obvious surface veins bilaterally PULMONARY: There is good air exchange  ABDOMEN: Soft and non-tender  MUSCULOSKELETAL: There are no major deformities or cyanosis. NEUROLOGIC: No focal weakness or paresthesias are detected. SKIN: There are no ulcers or rashes noted. PSYCHIATRIC: The patient has a normal affect.  DATA:  Noninvasive studies revealed normal triphasic waveforms in his ulnar and radial arteries bilaterally.  Vein mapping showed small to moderate cephalic and basilic veins bilaterally.  I imaged his left arm veins with the SonoSite myself.  He does have patency but the vein size is somewhat marginal.  MEDICAL ISSUES: I had a very long discussion with the patient regarding options for hemodialysis.  Explained the need to transition from the catheter as soon as possible.  He has equal vein caliber and right and left arm so therefore would recommend left arm access since he is right-hand dominant.  I discussed AV fistula and AV graft.  I would recommend fistula placement as initial access option.  We will make the decision at the time of surgery of radiocephalic versus brachiocephalic and even possible brachiobasilic.  I did explain that if we opted for brachiobasilic he would require a second stage basilic vein transposition.  We will schedule his surgery on a Tuesday or Thursday at his earliest Hector Mahoney. Angeli Demilio, MD [facility] Tel [phone] Pager [phone]

## 2019-05-12 NOTE — Progress Notes (Signed)
Vascular and Vein Specialist of [place]  Patient name: Hector Mahoney MRN: [id number] DOB: 1970/11/04 Sex: male  REASON FOR CONSULT: Discuss access for hemodialysis  HPI: Hector Mahoney is a 48 y.o. male, who is here today for discussion of access for hemodialysis.  He had a tunneled hemodialysis catheter placed at initiation of dialysis in March 2020.  He is never had arm access.  Fortunately he has had no evidence of infection of his catheter and has had good use and good dialysis runs.  He is right-handed.  He does not have a pacemaker.  Past Medical History:  Diagnosis Date  . ABSCESS, TOOTH 04/23/2009  . AKI (acute kidney injury) ([place]) 08/2015  . Anemia   . CHF (congestive heart failure) ([place])   . Chronic kidney disease   . DIABETES MELLITUS, TYPE I 07/30/2007  . Esophageal reflux 04/27/2009  . FATIGUE 04/27/2009  . HYPERLIPIDEMIA 04/24/2007  . HYPERTENSION 04/24/2007  . INSOMNIA-SLEEP DISORDER-UNSPEC 04/27/2009  . LUMBAR STRAIN, ACUTE 12/11/2008  . RASH-NONVESICULAR 03/11/2008  . Shortness of breath    "lying down and w/exertion right now" (12/22/2012)    Family History  Problem Relation Age of Onset  . Hypertension Mother   . Diabetes Mother   . Diabetes Maternal Aunt   . Lung cancer Maternal Grandfather   . Colon polyps Maternal Uncle   . Colon cancer Maternal Uncle   . Heart disease Maternal Uncle   . Kidney disease Neg Hx   . Esophageal cancer Neg Hx   . Rectal cancer Neg Hx   . Stomach cancer Neg Hx     SOCIAL HISTORY: Social History   Socioeconomic History  . Marital status: Married    Spouse name: Not on file  . Number of children: 0  . Years of education: Not on file  . Highest education level: Not on file  Occupational History  . Not on file  Social Needs  . Financial resource strain: Not on file  . Food insecurity    Worry: Not on file    Inability: Not on file  . Transportation needs    Medical: Not on  file    Non-medical: Not on file  Tobacco Use  . Smoking status: Former Smoker    Types: Cigarettes    Quit date: 05/03/2015    Years since quitting: 4.0  . Smokeless tobacco: Never Used  . Tobacco comment: 12/22/2012 "used to smoke cigarettes once or twice/month; haven't had any cigarettes for years"  Substance and Sexual Activity  . Alcohol use: Yes    Comment: occ  . Drug use: No  . Sexual activity: Yes  Lifestyle  . Physical activity    Days per week: Not on file    Minutes per session: Not on file  . Stress: Not on file  Relationships  . Social Herbalist on phone: Not on file    Gets together: Not on file    Attends religious service: Not on file    Active member of club or organization: Not on file    Attends meetings of clubs or organizations: Not on file    Relationship status: Not on file  . Intimate partner violence    Fear of current or ex partner: Not on file    Emotionally abused: Not on file    Physically abused: Not on file    Forced sexual activity: Not on file  Other Topics Concern  . Not on file  Social History Narrative  . Not on file    No Known Allergies  Current Outpatient Medications  Medication Sig Dispense Refill  . amLODipine (NORVASC) 5 MG tablet Take 1 tablet (5 mg total) by mouth daily. 30 tablet 1  . aspirin EC 81 MG EC tablet Take 1 tablet (81 mg total) by mouth daily. 30 tablet 0  . calcitRIOL (ROCALTROL) 0.25 MCG capsule Take 1 capsule (0.25 mcg total) by mouth every Monday, Wednesday, and Friday at 6 PM. 15 capsule 0  . calcium acetate (PHOSLO) 667 MG capsule Take 2 capsules (1,334 mg total) by mouth 3 (three) times daily with meals. (Patient taking differently: Take 1,334 mg by mouth 3 (three) times daily with meals. ) 180 capsule 3  . Calcium Carbonate Antacid (TUMS SMOOTHIES PO) Take by mouth as needed.    . cinacalcet (SENSIPAR) 60 MG tablet Take 60 mg by mouth daily.    Marland Kitchen dicyclomine (BENTYL) 20 MG tablet Take 1 tablet  (20 mg total) by mouth 3 (three) times daily before meals. 90 tablet 3  . ferrous sulfate 325 (65 FE) MG tablet Take 1 tablet (325 mg total) by mouth 2 (two) times daily with a meal. 60 tablet 0  . insulin aspart (NOVOLOG) 100 UNIT/ML injection Inject 8-12 Units into the skin 3 (three) times daily with meals. 2 vial 5  . insulin glargine (LANTUS) 100 UNIT/ML injection Inject 0.25 mLs (25 Units total) into the skin at bedtime. 20 mL 5  . isosorbide mononitrate (IMDUR) 30 MG 24 hr tablet TAKE ONE-HALF TABLET BY MOUTH ONCE DAILY (Patient taking differently: TAKE 15 mg  TABLET BY MOUTH ONCE DAILY) 90 tablet 1  . Menthol-Methyl Salicylate (MUSCLE RUB) 10-15 % CREA Apply 1 application topically as needed for muscle pain (for leg cramps). 35 g 1  . omeprazole (PRILOSEC) 40 MG capsule Take 1 capsule (40 mg total) by mouth daily. 60 capsule 3  . rOPINIRole (REQUIP) 0.25 MG tablet TAKE 1 TABLET BY MOUTH AT BEDTIME 55 tablet 2  . rosuvastatin (CRESTOR) 10 MG tablet Take 1 tablet (10 mg total) by mouth daily. 30 tablet 3   No current facility-administered medications for this visit.     REVIEW OF SYSTEMS:  [X]  denotes positive finding, [ ]  denotes negative finding Cardiac  Comments:  Chest pain or chest pressure:    Shortness of breath upon exertion:    Short of breath when lying flat:    Irregular heart rhythm:        Vascular    Pain in calf, thigh, or hip brought on by ambulation:    Pain in feet at night that wakes you up from your sleep:     Blood clot in your veins:    Leg swelling:         Pulmonary    Oxygen at home:    Productive cough:     Wheezing:         Neurologic    Sudden weakness in arms or legs:     Sudden numbness in arms or legs:     Sudden onset of difficulty speaking or slurred speech:    Temporary loss of vision in one eye:     Problems with dizziness:         Gastrointestinal    Blood in stool:     Vomited blood:         Genitourinary    Burning when urinating:      Blood in urine:  Psychiatric    Major depression:         Hematologic    Bleeding problems:    Problems with blood clotting too easily:        Skin    Rashes or ulcers:        Constitutional    Fever or chills:      PHYSICAL EXAM: Vitals:   05/12/19 0919  BP: 121/82  Pulse: 100  Resp: 20  Temp: 97.8 F (36.6 C)  SpO2: 97%  Weight: 195 lb 9.6 oz (88.7 kg)  Height: 5\' 6"  (1.676 m)    GENERAL: The patient is a well-nourished male, in no acute distress. The vital signs are documented above. CARDIOVASCULAR: 2+ radial pulses bilaterally.  Does not have obvious surface veins bilaterally PULMONARY: There is good air exchange  ABDOMEN: Soft and non-tender  MUSCULOSKELETAL: There are no major deformities or cyanosis. NEUROLOGIC: No focal weakness or paresthesias are detected. SKIN: There are no ulcers or rashes noted. PSYCHIATRIC: The patient has a normal affect.  DATA:  Noninvasive studies revealed normal triphasic waveforms in his ulnar and radial arteries bilaterally.  Vein mapping showed small to moderate cephalic and basilic veins bilaterally.  I imaged his left arm veins with the SonoSite myself.  He does have patency but the vein size is somewhat marginal.  MEDICAL ISSUES: I had a very long discussion with the patient regarding options for hemodialysis.  Explained the need to transition from the catheter as soon as possible.  He has equal vein caliber and right and left arm so therefore would recommend left arm access since he is right-hand dominant.  I discussed AV fistula and AV graft.  I would recommend fistula placement as initial access option.  We will make the decision at the time of surgery of radiocephalic versus brachiocephalic and even possible brachiobasilic.  I did explain that if we opted for brachiobasilic he would require a second stage basilic vein transposition.  We will schedule his surgery on a Tuesday or Thursday at his earliest [place]. Jentri Aye, MD [facility] Vascular and Vein Specialists of [facility] Tel [phone] Pager [phone]

## 2019-05-13 DIAGNOSIS — Z992 Dependence on renal dialysis: Secondary | ICD-10-CM | POA: Diagnosis not present

## 2019-05-13 DIAGNOSIS — R112 Nausea with vomiting, unspecified: Secondary | ICD-10-CM | POA: Diagnosis not present

## 2019-05-13 DIAGNOSIS — N186 End stage renal disease: Secondary | ICD-10-CM | POA: Diagnosis not present

## 2019-05-13 DIAGNOSIS — E1022 Type 1 diabetes mellitus with diabetic chronic kidney disease: Secondary | ICD-10-CM | POA: Diagnosis not present

## 2019-05-14 DIAGNOSIS — E108 Type 1 diabetes mellitus with unspecified complications: Secondary | ICD-10-CM | POA: Diagnosis not present

## 2019-05-14 DIAGNOSIS — Z87891 Personal history of nicotine dependence: Secondary | ICD-10-CM | POA: Diagnosis not present

## 2019-05-14 DIAGNOSIS — E1022 Type 1 diabetes mellitus with diabetic chronic kidney disease: Secondary | ICD-10-CM | POA: Diagnosis not present

## 2019-05-14 DIAGNOSIS — Z7682 Awaiting organ transplant status: Secondary | ICD-10-CM | POA: Diagnosis not present

## 2019-05-14 DIAGNOSIS — K8689 Other specified diseases of pancreas: Secondary | ICD-10-CM | POA: Diagnosis not present

## 2019-05-14 DIAGNOSIS — Z01818 Encounter for other preprocedural examination: Secondary | ICD-10-CM | POA: Diagnosis not present

## 2019-05-14 DIAGNOSIS — N186 End stage renal disease: Secondary | ICD-10-CM | POA: Diagnosis not present

## 2019-05-14 DIAGNOSIS — I159 Secondary hypertension, unspecified: Secondary | ICD-10-CM | POA: Diagnosis not present

## 2019-05-14 DIAGNOSIS — E669 Obesity, unspecified: Secondary | ICD-10-CM | POA: Diagnosis not present

## 2019-05-14 DIAGNOSIS — I5030 Unspecified diastolic (congestive) heart failure: Secondary | ICD-10-CM | POA: Diagnosis not present

## 2019-05-15 DIAGNOSIS — Z992 Dependence on renal dialysis: Secondary | ICD-10-CM | POA: Diagnosis not present

## 2019-05-15 DIAGNOSIS — T8249XA Other complication of vascular dialysis catheter, initial encounter: Secondary | ICD-10-CM | POA: Diagnosis not present

## 2019-05-15 DIAGNOSIS — N2581 Secondary hyperparathyroidism of renal origin: Secondary | ICD-10-CM | POA: Diagnosis not present

## 2019-05-15 DIAGNOSIS — N186 End stage renal disease: Secondary | ICD-10-CM | POA: Diagnosis not present

## 2019-05-18 DIAGNOSIS — Z992 Dependence on renal dialysis: Secondary | ICD-10-CM | POA: Diagnosis not present

## 2019-05-18 DIAGNOSIS — N2581 Secondary hyperparathyroidism of renal origin: Secondary | ICD-10-CM | POA: Diagnosis not present

## 2019-05-18 DIAGNOSIS — N186 End stage renal disease: Secondary | ICD-10-CM | POA: Diagnosis not present

## 2019-05-18 DIAGNOSIS — T8249XA Other complication of vascular dialysis catheter, initial encounter: Secondary | ICD-10-CM | POA: Diagnosis not present

## 2019-05-20 DIAGNOSIS — T8249XA Other complication of vascular dialysis catheter, initial encounter: Secondary | ICD-10-CM | POA: Diagnosis not present

## 2019-05-20 DIAGNOSIS — N2581 Secondary hyperparathyroidism of renal origin: Secondary | ICD-10-CM | POA: Diagnosis not present

## 2019-05-20 DIAGNOSIS — N186 End stage renal disease: Secondary | ICD-10-CM | POA: Diagnosis not present

## 2019-05-20 DIAGNOSIS — Z992 Dependence on renal dialysis: Secondary | ICD-10-CM | POA: Diagnosis not present

## 2019-05-22 DIAGNOSIS — T8249XA Other complication of vascular dialysis catheter, initial encounter: Secondary | ICD-10-CM | POA: Diagnosis not present

## 2019-05-22 DIAGNOSIS — N2581 Secondary hyperparathyroidism of renal origin: Secondary | ICD-10-CM | POA: Diagnosis not present

## 2019-05-22 DIAGNOSIS — Z992 Dependence on renal dialysis: Secondary | ICD-10-CM | POA: Diagnosis not present

## 2019-05-22 DIAGNOSIS — N186 End stage renal disease: Secondary | ICD-10-CM | POA: Diagnosis not present

## 2019-05-25 DIAGNOSIS — T8249XA Other complication of vascular dialysis catheter, initial encounter: Secondary | ICD-10-CM | POA: Diagnosis not present

## 2019-05-25 DIAGNOSIS — N2581 Secondary hyperparathyroidism of renal origin: Secondary | ICD-10-CM | POA: Diagnosis not present

## 2019-05-25 DIAGNOSIS — N186 End stage renal disease: Secondary | ICD-10-CM | POA: Diagnosis not present

## 2019-05-25 DIAGNOSIS — Z992 Dependence on renal dialysis: Secondary | ICD-10-CM | POA: Diagnosis not present

## 2019-05-26 ENCOUNTER — Encounter: Payer: Self-pay | Admitting: Internal Medicine

## 2019-05-26 ENCOUNTER — Ambulatory Visit: Payer: Self-pay

## 2019-05-26 ENCOUNTER — Ambulatory Visit (INDEPENDENT_AMBULATORY_CARE_PROVIDER_SITE_OTHER): Payer: Federal, State, Local not specified - PPO | Admitting: Internal Medicine

## 2019-05-26 ENCOUNTER — Other Ambulatory Visit: Payer: Self-pay

## 2019-05-26 DIAGNOSIS — H6981 Other specified disorders of Eustachian tube, right ear: Secondary | ICD-10-CM

## 2019-05-26 DIAGNOSIS — I10 Essential (primary) hypertension: Secondary | ICD-10-CM

## 2019-05-26 DIAGNOSIS — N186 End stage renal disease: Secondary | ICD-10-CM

## 2019-05-26 DIAGNOSIS — J309 Allergic rhinitis, unspecified: Secondary | ICD-10-CM | POA: Diagnosis not present

## 2019-05-26 DIAGNOSIS — R42 Dizziness and giddiness: Secondary | ICD-10-CM | POA: Diagnosis not present

## 2019-05-26 DIAGNOSIS — H6991 Unspecified Eustachian tube disorder, right ear: Secondary | ICD-10-CM

## 2019-05-26 MED ORDER — TRIAMCINOLONE ACETONIDE 55 MCG/ACT NA AERO: 2.0000 | INHALATION_SPRAY | Freq: Every day | NASAL | 12 refills | Status: DC

## 2019-05-26 MED ORDER — CETIRIZINE HCL 10 MG PO TABS: 10.0000 mg | ORAL_TABLET | Freq: Every day | ORAL | 11 refills | Status: DC

## 2019-05-26 MED ORDER — MECLIZINE HCL 12.5 MG PO TABS: 12.5000 mg | ORAL_TABLET | Freq: Three times a day (TID) | ORAL | 2 refills | Status: DC | PRN

## 2019-05-26 NOTE — Patient Instructions (Signed)
Please take all new medication as prescribed - the zyrtec, nasacort, meclizine  Please continue all other medications such as Mucinex otc as well  Please have the pharmacy call with any other refills you may need.  Please continue your efforts at being more active, low cholesterol diet, and weight control.  Please keep your appointments with your specialists as you may have planned  You are given the work note today

## 2019-05-26 NOTE — Telephone Encounter (Signed)
Pt has appt scheduled today at 420

## 2019-05-26 NOTE — Progress Notes (Signed)
Subjective:    Patient ID: Hector Mahoney, male    DOB: 06-Jan-1971, 48 y.o.   MRN: [id number]  HPI  Here with c/o 1-11/2  wk onset dizziness that only seems to occur the days after HD, has to leave work early due the severity.  BS seems overall much better controlled recently.  BP has been on the lower side about 110 sbp. Pt d/w nephrology who suggested take HTN med in the PM, but hasnt seemed to resolve. Now back on HD( M-W-F) x about 2 mo, after PD catheter fungal infection.  Describes this dizziness as room spinning, worse with sitting up in bed, or moving about at work, had to call wife to pick him up b/c could not drive home.  Does also have right hearing mild diminished and "sloshing" in the right ear after HD.  Pt denies chest pain, increased sob or doe, wheezing, orthopnea, PND, increased LE swelling, palpitations, dizziness or syncope.   Pt denies polydipsia, polyuria Past Medical History:  Diagnosis Date  . ABSCESS, TOOTH 04/23/2009  . AKI (acute kidney injury) ([place]) 08/2015  . Anemia   . CHF (congestive heart failure) ([place])   . Chronic kidney disease   . DIABETES MELLITUS, TYPE I 07/30/2007  . Esophageal reflux 04/27/2009  . FATIGUE 04/27/2009  . HYPERLIPIDEMIA 04/24/2007  . HYPERTENSION 04/24/2007  . INSOMNIA-SLEEP DISORDER-UNSPEC 04/27/2009  . LUMBAR STRAIN, ACUTE 12/11/2008  . RASH-NONVESICULAR 03/11/2008  . Shortness of breath    "lying down and w/exertion right now" (12/22/2012)   Past Surgical History:  Procedure Laterality Date  . IR FLUORO GUIDE CV LINE RIGHT  10/22/2017  . IR US GUIDE VASC ACCESS RIGHT  10/22/2017  . MOUTH SURGERY      reports that he quit smoking about 4 years ago. His smoking use included cigarettes. He has never used smokeless tobacco. He reports current alcohol use. He reports that he does not use drugs. family history includes Colon cancer in his maternal uncle; Colon polyps in his maternal uncle; Diabetes in his maternal aunt and mother; Heart disease  in his maternal uncle; Hypertension in his mother; Lung cancer in his maternal grandfather. No Known Allergies Current Outpatient Medications on File Prior to Visit  Medication Sig Dispense Refill  . amLODipine (NORVASC) 5 MG tablet Take 1 tablet (5 mg total) by mouth daily. 30 tablet 1  . aspirin EC 81 MG EC tablet Take 1 tablet (81 mg total) by mouth daily. 30 tablet 0  . calcitRIOL (ROCALTROL) 0.25 MCG capsule Take 1 capsule (0.25 mcg total) by mouth every Monday, Wednesday, and Friday at 6 PM. 15 capsule 0  . calcium acetate (PHOSLO) 667 MG capsule Take 2 capsules (1,334 mg total) by mouth 3 (three) times daily with meals. (Patient taking differently: Take 1,334 mg by mouth 3 (three) times daily with meals. ) 180 capsule 3  . Calcium Carbonate Antacid (TUMS SMOOTHIES PO) Take by mouth as needed.    . cinacalcet (SENSIPAR) 60 MG tablet Take 60 mg by mouth daily.    Marland Kitchen dicyclomine (BENTYL) 20 MG tablet Take 1 tablet (20 mg total) by mouth 3 (three) times daily before meals. 90 tablet 3  . ferrous sulfate 325 (65 FE) MG tablet Take 1 tablet (325 mg total) by mouth 2 (two) times daily with a meal. 60 tablet 0  . insulin aspart (NOVOLOG) 100 UNIT/ML injection Inject 8-12 Units into the skin 3 (three) times daily with meals. 2 vial 5  . insulin  glargine (LANTUS) 100 UNIT/ML injection Inject 0.25 mLs (25 Units total) into the skin at bedtime. 20 mL 5  . isosorbide mononitrate (IMDUR) 30 MG 24 hr tablet TAKE ONE-HALF TABLET BY MOUTH ONCE DAILY (Patient taking differently: TAKE 15 mg  TABLET BY MOUTH ONCE DAILY) 90 tablet 1  . Menthol-Methyl Salicylate (MUSCLE RUB) 10-15 % CREA Apply 1 application topically as needed for muscle pain (for leg cramps). 35 g 1  . omeprazole (PRILOSEC) 40 MG capsule Take 1 capsule (40 mg total) by mouth daily. 60 capsule 3  . rOPINIRole (REQUIP) 0.25 MG tablet TAKE 1 TABLET BY MOUTH AT BEDTIME 55 tablet 2  . rosuvastatin (CRESTOR) 10 MG tablet Take 1 tablet (10 mg total) by  mouth daily. 30 tablet 3   No current facility-administered medications on file prior to visit.    Review of Systems  Constitutional: Negative for other unusual diaphoresis or sweats HENT: Negative for ear discharge or swelling Eyes: Negative for other worsening visual disturbances Respiratory: Negative for stridor or other swelling  Gastrointestinal: Negative for worsening distension or other blood Genitourinary: Negative for retention or other urinary change Musculoskeletal: Negative for other MSK pain or swelling Skin: Negative for color change or other new lesions Neurological: Negative for worsening tremors and other numbness  Psychiatric/Behavioral: Negative for worsening agitation or other fatigue All otherwise neg per pt     Objective:   Physical Exam BP 126/84   Pulse 97   Temp 98.6 F (37 C) (Oral)   Ht 5\' 6"  (1.676 m)   Wt 196 lb (88.9 kg)   SpO2 97%   BMI 31.64 kg/m  VS noted,  Constitutional: Pt appears in NAD HENT: Head: NCAT.  Right Ear: External ear normal.  Left Ear: External ear normal.  Bilat tm's with mild erythema.  Max sinus areas non tender.  Pharynx with mild erythema, no exudate Eyes: . Pupils are equal, round, and reactive to light. Conjunctivae and EOM are normal Nose: without d/c or deformity Neck: Neck supple. Gross normal ROM Cardiovascular: Normal rate and regular rhythm.   Pulmonary/Chest: Effort normal and breath sounds without rales or wheezing.  Neurological: Pt is alert. At baseline orientation, motor grossly intact Skin: Skin is warm. No rashes, other new lesions, no LE edema Psychiatric: Pt behavior is normal without agitation  All otherwise neg per pt  Lab Results  Component Value Date   WBC 11.2 (H) 07/20/2018   HGB 10.9 (L) 07/20/2018   HCT 34.3 (L) 07/20/2018   PLT 243 07/20/2018   GLUCOSE 130 (H) 07/20/2018   CHOL 293 (H) 01/02/2013   TRIG 316.0 (H) 01/02/2013   HDL 59.60 01/02/2013   LDLDIRECT 176.4 01/02/2013   ALT  24 07/20/2018   AST 17 07/20/2018   NA 139 07/20/2018   K 3.9 07/20/2018   CL 100 07/20/2018   CREATININE 11.96 (H) 07/20/2018   BUN 69 (H) 07/20/2018   CO2 25 07/20/2018   TSH 2.553 12/22/2012   INR 1.05 10/21/2017   HGBA1C 7.3 10/07/2017        Assessment & Plan:

## 2019-05-26 NOTE — Telephone Encounter (Signed)
Pt. Reports he has recently changed to hemodialysis 2-3 months ago. Has noticed the day after dialysis, he has dizziness/vertigo.Has reported this to his "kidney doctor and he told me to take my medications at night. This has not helped. It is scaring me and I'd like to talk to Dr. Jenny Reichmann." Warm transfer to [facility] in the practice.  Answer Assessment - Initial Assessment Questions 1. DESCRIPTION: "Describe your dizziness."     Lightheadedness 2. VERTIGO: "Do you feel like either you or the room is spinning or tilting?"      Yes - at times 3. LIGHTHEADED: "Do you feel lightheaded?" (e.g., somewhat faint, woozy, weak upon standing)     Yes 4. SEVERITY: "How bad is it?"  "Can you walk?"   - MILD - Feels unsteady but walking normally.   - MODERATE - Feels very unsteady when walking, but not falling; interferes with normal activities (e.g., school, work) .   - SEVERE - Unable to walk without falling (requires assistance).     Moderate 5. ONSET:  "When did the dizziness begin?"     2 weeks ago 6. AGGRAVATING FACTORS: "Does anything make it worse?" (e.g., standing, change in head position)     After dialysis 7. CAUSE: "What do you think is causing the dizziness?"     Unsure 8. RECURRENT SYMPTOM: "Have you had dizziness before?" If so, ask: "When was the last time?" "What happened that time?"     No 9. OTHER SYMPTOMS: "Do you have any other symptoms?" (e.g., headache, weakness, numbness, vomiting, earache)     Nausea 10. PREGNANCY: "Is there any chance you are pregnant?" "When was your last menstrual period?"       n/a  Protocols used: DIZZINESS - VERTIGO-A-AH

## 2019-05-27 DIAGNOSIS — T8249XA Other complication of vascular dialysis catheter, initial encounter: Secondary | ICD-10-CM | POA: Diagnosis not present

## 2019-05-27 DIAGNOSIS — N2581 Secondary hyperparathyroidism of renal origin: Secondary | ICD-10-CM | POA: Diagnosis not present

## 2019-05-27 DIAGNOSIS — N186 End stage renal disease: Secondary | ICD-10-CM | POA: Diagnosis not present

## 2019-05-27 DIAGNOSIS — Z992 Dependence on renal dialysis: Secondary | ICD-10-CM | POA: Diagnosis not present

## 2019-05-29 DIAGNOSIS — N186 End stage renal disease: Secondary | ICD-10-CM | POA: Diagnosis not present

## 2019-05-29 DIAGNOSIS — Z992 Dependence on renal dialysis: Secondary | ICD-10-CM | POA: Diagnosis not present

## 2019-05-29 DIAGNOSIS — T8249XA Other complication of vascular dialysis catheter, initial encounter: Secondary | ICD-10-CM | POA: Diagnosis not present

## 2019-05-29 DIAGNOSIS — N2581 Secondary hyperparathyroidism of renal origin: Secondary | ICD-10-CM | POA: Diagnosis not present

## 2019-05-31 ENCOUNTER — Encounter: Payer: Self-pay | Admitting: Internal Medicine

## 2019-05-31 DIAGNOSIS — R42 Dizziness and giddiness: Secondary | ICD-10-CM | POA: Insufficient documentation

## 2019-05-31 DIAGNOSIS — N186 End stage renal disease: Secondary | ICD-10-CM | POA: Insufficient documentation

## 2019-05-31 DIAGNOSIS — J309 Allergic rhinitis, unspecified: Secondary | ICD-10-CM | POA: Insufficient documentation

## 2019-05-31 DIAGNOSIS — H6981 Other specified disorders of Eustachian tube, right ear: Secondary | ICD-10-CM | POA: Insufficient documentation

## 2019-05-31 NOTE — Assessment & Plan Note (Signed)
stable overall by history and exam, recent data reviewed with pt, and pt to continue medical treatment as before,  to f/u any worsening symptoms or concerns  

## 2019-05-31 NOTE — Assessment & Plan Note (Signed)
For contd HD

## 2019-05-31 NOTE — Assessment & Plan Note (Signed)
Mild to mod, for meclizine po prn,  to f/u any worsening symptoms or concerns

## 2019-05-31 NOTE — Assessment & Plan Note (Signed)
Mild to mod, for zyrtec and nasacort asd,  to f/u any worsening symptoms or concerns 

## 2019-05-31 NOTE — Assessment & Plan Note (Signed)
Mild to mod, for mucinex otc prn,  to f/u any worsening symptoms or concerns

## 2019-06-01 DIAGNOSIS — N186 End stage renal disease: Secondary | ICD-10-CM | POA: Diagnosis not present

## 2019-06-01 DIAGNOSIS — N2581 Secondary hyperparathyroidism of renal origin: Secondary | ICD-10-CM | POA: Diagnosis not present

## 2019-06-01 DIAGNOSIS — T8249XA Other complication of vascular dialysis catheter, initial encounter: Secondary | ICD-10-CM | POA: Diagnosis not present

## 2019-06-01 DIAGNOSIS — Z992 Dependence on renal dialysis: Secondary | ICD-10-CM | POA: Diagnosis not present

## 2019-06-02 ENCOUNTER — Other Ambulatory Visit (HOSPITAL_COMMUNITY)
Admission: RE | Admit: 2019-06-02 | Discharge: 2019-06-02 | Disposition: A | Discharge status: Home / Self Care | Payer: Federal, State, Local not specified - PPO | Source: Ambulatory Visit | Attending: Vascular Surgery | Admitting: Vascular Surgery

## 2019-06-02 DIAGNOSIS — Z01812 Encounter for preprocedural laboratory examination: Secondary | ICD-10-CM | POA: Insufficient documentation

## 2019-06-02 DIAGNOSIS — Z20828 Contact with and (suspected) exposure to other viral communicable diseases: Secondary | ICD-10-CM | POA: Insufficient documentation

## 2019-06-02 LAB — SARS CORONAVIRUS 2 (TAT 6-24 HRS): SARS Coronavirus 2: NEGATIVE

## 2019-06-03 ENCOUNTER — Other Ambulatory Visit: Payer: Self-pay

## 2019-06-03 ENCOUNTER — Encounter (HOSPITAL_COMMUNITY): Payer: Self-pay | Admitting: *Deleted

## 2019-06-03 DIAGNOSIS — N186 End stage renal disease: Secondary | ICD-10-CM | POA: Diagnosis not present

## 2019-06-03 DIAGNOSIS — Z992 Dependence on renal dialysis: Secondary | ICD-10-CM | POA: Diagnosis not present

## 2019-06-03 DIAGNOSIS — N2581 Secondary hyperparathyroidism of renal origin: Secondary | ICD-10-CM | POA: Diagnosis not present

## 2019-06-03 DIAGNOSIS — T8249XA Other complication of vascular dialysis catheter, initial encounter: Secondary | ICD-10-CM | POA: Diagnosis not present

## 2019-06-03 NOTE — Anesthesia Preprocedure Evaluation (Addendum)
Anesthesia Evaluation  Patient identified by MRN, date of birth, ID band Patient awake    Reviewed: Allergy & Precautions, NPO status , Patient's Chart, lab work & pertinent test results  Airway Mallampati: II  TM Distance: >3 FB Neck ROM: Full    Dental  (+) Dental Advisory Given   Pulmonary former smoker,    breath sounds clear to auscultation       Cardiovascular hypertension, Pt. on medications +CHF   Rhythm:Regular Rate:Normal     Neuro/Psych negative neurological ROS     GI/Hepatic Neg liver ROS, GERD  ,  Endo/Other  diabetes, Type 2  Renal/GU ESRF and DialysisRenal disease     Musculoskeletal   Abdominal   Peds  Hematology  (+) anemia ,   Anesthesia Other Findings   Reproductive/Obstetrics                            Lab Results  Component Value Date   WBC 11.2 (H) 07/20/2018   HGB 14.6 06/04/2019   HCT 43.0 06/04/2019   MCV 84.3 07/20/2018   PLT 243 07/20/2018   Lab Results  Component Value Date   CREATININE 8.30 (H) 06/04/2019   BUN 39 (H) 06/04/2019   NA 137 06/04/2019   K 5.0 06/04/2019   CL 96 (L) 06/04/2019   CO2 25 07/20/2018    Anesthesia Physical Anesthesia Plan  ASA: III  Anesthesia Plan:    Post-op Pain Management:    Induction:   PONV Risk Score and Plan:   Airway Management Planned:   Additional Equipment:   Intra-op Plan:   Post-operative Plan:   Informed Consent:   Plan Discussed with:   Anesthesia Plan Comments: (Lexiscan Sestamibi stress test 01/26/2019: Lexiscan stress test was performed. Stress EKG is non-diagnostic, as this is pharmacological stress test. Pharmacological myocardial perfusion imaging is normal. Normal left ventricular systolic function.  Low risk study.  TTE 09/27/17: Left ventricle: The cavity size was normal. There was severe   concentric hypertrophy. Systolic function was normal. The   estimated ejection  fraction was in the range of 60% to 65%. Wall   motion was normal; there were no regional wall motion   abnormalities. Doppler parameters are consistent with   pseudonormal left ventricular relaxation (grade 2 diastolic   dysfunction). The E/e&' ratio is >15, suggesting elevated LV   filling pressure. - Mitral valve: Mildly thickened leaflets . There was mild   regurgitation. - Left atrium: Mildly dilated. - Inferior vena cava: The vessel was normal in size. The   respirophasic diameter changes were in the normal range (>= 50%),   consistent with normal central venous pressure.  Impressions:  - Compared to a prior study in 2017, the LVEF is higher at 60-65%   with grade 2 DD and elevated LV filling pressure and severe LVH.)       Anesthesia Quick Evaluation

## 2019-06-03 NOTE — Progress Notes (Signed)
Patient denies shortness of breath, fever, cough and chest pain.  PCP - Dr Cathlean Cower Cardiologist - Dr Azzie Glatter  Chest x-ray - Denies EKG - DOS 06/04/19 Stress Test - 01/26/19 ECHO - 09/27/17 Cardiac Cath - Denies  Fasting Blood Sugar - 100-120s Checks Blood Sugar 3 times a day  THE NIGHT BEFORE SURGERY, take 80% of your Lantus dose -20 units.  THE MORNING OF SURGERY, None - unless CBG is greater than 220 mg/dL, you may take  of your sliding scale (correction) dose of Novolog insulin.  . If your blood sugar is less than 70 mg/dL, you will need to treat for low blood sugar: o Do not take insulin. o Treat a low blood sugar (less than 70 mg/dL) with  cup of clear juice (cranberry or apple),  o Recheck blood sugar in 15 minutes after treatment (to make sure it is greater than 70 mg/dL). If your blood sugar is not greater than 70 mg/dL on recheck, call [phone] for further instructions.  Anesthesia review: Yes  STOP now taking any Aspirin (unless otherwise instructed by your surgeon), Aleve, Naproxen, Ibuprofen, Motrin, Advil, Goody's, BC's, all herbal medications, fish oil, and all vitamins.   Coronavirus Screening Covid test 06/02/19 was negative  Patient verbalized understanding of instructions that were given via phone.

## 2019-06-04 ENCOUNTER — Encounter (HOSPITAL_COMMUNITY): Payer: Self-pay | Admitting: *Deleted

## 2019-06-04 ENCOUNTER — Encounter (HOSPITAL_COMMUNITY)
Admission: RE | Disposition: A | Discharge status: Home / Self Care | Payer: Self-pay | Source: Home / Self Care | Attending: Vascular Surgery

## 2019-06-04 ENCOUNTER — Ambulatory Visit (HOSPITAL_COMMUNITY)
Admission: RE | Admit: 2019-06-04 | Discharge: 2019-06-04 | Disposition: A | Discharge status: Home / Self Care | Payer: Federal, State, Local not specified - PPO | Attending: Vascular Surgery | Admitting: Vascular Surgery

## 2019-06-04 ENCOUNTER — Ambulatory Visit (HOSPITAL_COMMUNITY): Payer: Federal, State, Local not specified - PPO | Admitting: Physician Assistant

## 2019-06-04 ENCOUNTER — Other Ambulatory Visit: Payer: Self-pay

## 2019-06-04 DIAGNOSIS — I132 Hypertensive heart and chronic kidney disease with heart failure and with stage 5 chronic kidney disease, or end stage renal disease: Secondary | ICD-10-CM | POA: Diagnosis not present

## 2019-06-04 DIAGNOSIS — Z992 Dependence on renal dialysis: Secondary | ICD-10-CM | POA: Diagnosis not present

## 2019-06-04 DIAGNOSIS — Z87891 Personal history of nicotine dependence: Secondary | ICD-10-CM | POA: Diagnosis not present

## 2019-06-04 DIAGNOSIS — Z7982 Long term (current) use of aspirin: Secondary | ICD-10-CM | POA: Diagnosis not present

## 2019-06-04 DIAGNOSIS — N185 Chronic kidney disease, stage 5: Secondary | ICD-10-CM | POA: Diagnosis not present

## 2019-06-04 DIAGNOSIS — E785 Hyperlipidemia, unspecified: Secondary | ICD-10-CM | POA: Diagnosis not present

## 2019-06-04 DIAGNOSIS — K219 Gastro-esophageal reflux disease without esophagitis: Secondary | ICD-10-CM | POA: Insufficient documentation

## 2019-06-04 DIAGNOSIS — N186 End stage renal disease: Secondary | ICD-10-CM | POA: Diagnosis not present

## 2019-06-04 DIAGNOSIS — I5032 Chronic diastolic (congestive) heart failure: Secondary | ICD-10-CM | POA: Diagnosis not present

## 2019-06-04 DIAGNOSIS — Z794 Long term (current) use of insulin: Secondary | ICD-10-CM | POA: Insufficient documentation

## 2019-06-04 DIAGNOSIS — E1022 Type 1 diabetes mellitus with diabetic chronic kidney disease: Secondary | ICD-10-CM | POA: Diagnosis not present

## 2019-06-04 DIAGNOSIS — I5033 Acute on chronic diastolic (congestive) heart failure: Secondary | ICD-10-CM | POA: Diagnosis not present

## 2019-06-04 DIAGNOSIS — Z79899 Other long term (current) drug therapy: Secondary | ICD-10-CM | POA: Diagnosis not present

## 2019-06-04 HISTORY — PX: AV FISTULA PLACEMENT: SHX1204

## 2019-06-04 LAB — POCT I-STAT, CHEM 8
BUN: 39 mg/dL — ABNORMAL HIGH (ref 6–20)
Calcium, Ion: 1.03 mmol/L — ABNORMAL LOW (ref 1.15–1.40)
Chloride: 96 mmol/L — ABNORMAL LOW (ref 98–111)
Creatinine, Ser: 8.3 mg/dL — ABNORMAL HIGH (ref 0.61–1.24)
Glucose, Bld: 150 mg/dL — ABNORMAL HIGH (ref 70–99)
HCT: 43 % (ref 39.0–52.0)
Hemoglobin: 14.6 g/dL (ref 13.0–17.0)
Potassium: 5 mmol/L (ref 3.5–5.1)
Sodium: 137 mmol/L (ref 135–145)
TCO2: 32 mmol/L (ref 22–32)

## 2019-06-04 LAB — GLUCOSE, CAPILLARY: Glucose-Capillary: 179 mg/dL — ABNORMAL HIGH (ref 70–99)

## 2019-06-04 SURGERY — ARTERIOVENOUS (AV) FISTULA CREATION
Anesthesia: Monitor Anesthesia Care | Site: Arm Lower | Laterality: Left

## 2019-06-04 MED ORDER — LIDOCAINE-EPINEPHRINE 0.5 %-1:200000 IJ SOLN
INTRAMUSCULAR | Status: DC | PRN
  Administered 2019-06-04: 10.5 mL

## 2019-06-04 MED ORDER — ONDANSETRON HCL 4 MG/2ML IJ SOLN
INTRAMUSCULAR | Status: DC | PRN
  Administered 2019-06-04: 4 mg via INTRAVENOUS

## 2019-06-04 MED ORDER — OXYCODONE-ACETAMINOPHEN 5-325 MG PO TABS: 1.0000 | ORAL_TABLET | Freq: Four times a day (QID) | ORAL | 0 refills | Status: DC | PRN

## 2019-06-04 MED ORDER — FENTANYL CITRATE (PF) 100 MCG/2ML IJ SOLN
INTRAMUSCULAR | Status: DC | PRN
  Administered 2019-06-04 (×2): 25 ug via INTRAVENOUS

## 2019-06-04 MED ORDER — PHENYLEPHRINE 40 MCG/ML (10ML) SYRINGE FOR IV PUSH (FOR BLOOD PRESSURE SUPPORT)
PREFILLED_SYRINGE | INTRAVENOUS | Status: DC | PRN
  Administered 2019-06-04 (×2): 80 ug via INTRAVENOUS
  Administered 2019-06-04: 40 ug via INTRAVENOUS
  Administered 2019-06-04: 80 ug via INTRAVENOUS

## 2019-06-04 MED ORDER — CEFAZOLIN SODIUM-DEXTROSE 2-4 GM/100ML-% IV SOLN
2.0000 g | INTRAVENOUS | Status: AC
  Administered 2019-06-04: 10:00:00 2 g via INTRAVENOUS
  Filled 2019-06-04: qty 100

## 2019-06-04 MED ORDER — FENTANYL CITRATE (PF) 250 MCG/5ML IJ SOLN
INTRAMUSCULAR | Status: AC
  Filled 2019-06-04: qty 5

## 2019-06-04 MED ORDER — SODIUM CHLORIDE 0.9 % IV SOLN
INTRAVENOUS | Status: AC
  Filled 2019-06-04: qty 1.2

## 2019-06-04 MED ORDER — SODIUM CHLORIDE 0.9 % IV SOLN
INTRAVENOUS | Status: DC
  Administered 2019-06-04 (×2): via INTRAVENOUS

## 2019-06-04 MED ORDER — HEPARIN SODIUM (PORCINE) 1000 UNIT/ML IJ SOLN
INTRAMUSCULAR | Status: AC
  Filled 2019-06-04: qty 1

## 2019-06-04 MED ORDER — MIDAZOLAM HCL 5 MG/5ML IJ SOLN
INTRAMUSCULAR | Status: DC | PRN
  Administered 2019-06-04: 1 mg via INTRAVENOUS

## 2019-06-04 MED ORDER — 0.9 % SODIUM CHLORIDE (POUR BTL) OPTIME
TOPICAL | Status: DC | PRN
  Administered 2019-06-04: 1000 mL

## 2019-06-04 MED ORDER — LIDOCAINE-EPINEPHRINE 0.5 %-1:200000 IJ SOLN
INTRAMUSCULAR | Status: AC
  Filled 2019-06-04: qty 1

## 2019-06-04 MED ORDER — SODIUM CHLORIDE 0.9 % IV SOLN
INTRAVENOUS | Status: DC | PRN
  Administered 2019-06-04: 500 mL

## 2019-06-04 MED ORDER — CHLORHEXIDINE GLUCONATE 4 % EX LIQD: 60.0000 mL | Freq: Once | CUTANEOUS | Status: DC

## 2019-06-04 MED ORDER — PROPOFOL 500 MG/50ML IV EMUL
INTRAVENOUS | Status: DC | PRN
  Administered 2019-06-04: 75 ug/kg/min via INTRAVENOUS

## 2019-06-04 MED ORDER — MIDAZOLAM HCL 2 MG/2ML IJ SOLN
INTRAMUSCULAR | Status: AC
  Filled 2019-06-04: qty 2

## 2019-06-04 SURGICAL SUPPLY — 28 items
ADH SKN CLS APL DERMABOND .7 (GAUZE/BANDAGES/DRESSINGS) ×1
ARMBAND PINK RESTRICT EXTREMIT (MISCELLANEOUS) ×4 IMPLANT
CANISTER SUCT 3000ML PPV (MISCELLANEOUS) ×2 IMPLANT
CANNULA VESSEL 3MM 2 BLNT TIP (CANNULA) ×2 IMPLANT
CLIP LIGATING EXTRA MED SLVR (CLIP) ×2 IMPLANT
CLIP LIGATING EXTRA SM BLUE (MISCELLANEOUS) ×2 IMPLANT
COVER PROBE W GEL 5X96 (DRAPES) ×2 IMPLANT
COVER WAND RF STERILE (DRAPES) ×2 IMPLANT
DECANTER SPIKE VIAL GLASS SM (MISCELLANEOUS) ×2 IMPLANT
DERMABOND ADVANCED (GAUZE/BANDAGES/DRESSINGS) ×1
DERMABOND ADVANCED .7 DNX12 (GAUZE/BANDAGES/DRESSINGS) ×1 IMPLANT
ELECT REM PT RETURN 9FT ADLT (ELECTROSURGICAL) ×2
ELECTRODE REM PT RTRN 9FT ADLT (ELECTROSURGICAL) ×1 IMPLANT
GLOVE SS BIOGEL STRL SZ 7.5 (GLOVE) ×1 IMPLANT
GLOVE SUPERSENSE BIOGEL SZ 7.5 (GLOVE) ×1
GOWN STRL REUS W/ TWL LRG LVL3 (GOWN DISPOSABLE) ×3 IMPLANT
GOWN STRL REUS W/TWL LRG LVL3 (GOWN DISPOSABLE) ×6
KIT BASIN OR (CUSTOM PROCEDURE TRAY) ×2 IMPLANT
KIT TURNOVER KIT B (KITS) ×2 IMPLANT
NS IRRIG 1000ML POUR BTL (IV SOLUTION) ×2 IMPLANT
PACK CV ACCESS (CUSTOM PROCEDURE TRAY) ×2 IMPLANT
PAD ARMBOARD 7.5X6 YLW CONV (MISCELLANEOUS) ×4 IMPLANT
SUT PROLENE 6 0 CC (SUTURE) ×3 IMPLANT
SUT VIC AB 3-0 SH 27 (SUTURE) ×2
SUT VIC AB 3-0 SH 27X BRD (SUTURE) ×1 IMPLANT
TOWEL GREEN STERILE (TOWEL DISPOSABLE) ×2 IMPLANT
UNDERPAD 30X30 (UNDERPADS AND DIAPERS) ×2 IMPLANT
WATER STERILE IRR 1000ML POUR (IV SOLUTION) ×2 IMPLANT

## 2019-06-04 NOTE — Interval H&P Note (Signed)
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  The various methods of treatment have been discussed with the patient and family. After consideration of risks, benefits and other options for treatment, the patient has consented to  Procedure(s): ARTERIOVENOUS (AV) FISTULA CREATION LEFT ARM (Left) as a surgical intervention.  The patient's history has been reviewed, patient examined, no change in status, stable for surgery.  I have reviewed the patient's chart and labs.  Questions were answered to the patient's satisfaction.     Curt Jews

## 2019-06-04 NOTE — Discharge Instructions (Signed)
° °  [facility] ° °Discharge Instructions ° °AV Fistula or Graft Surgery for Dialysis Access ° °Please refer to the following instructions for your post-procedure care. Your surgeon or physician assistant will discuss any changes with you. ° °Activity ° °You may drive the day following your surgery, if you are comfortable and no longer taking prescription pain medication. Resume full activity as the soreness in your incision resolves. ° °Bathing/Showering ° °You may shower after you go home. Keep your incision dry for 48 hours. Do not soak in a bathtub, hot tub, or swim until the incision heals completely. You may not shower if you have a hemodialysis catheter. ° °Incision Care ° °Clean your incision with mild soap and water after 48 hours. Pat the area dry with a clean towel. You do not need a bandage unless otherwise instructed. Do not apply any ointments or creams to your incision. You may have skin glue on your incision. Do not peel it off. It will come off on its own in about one week. Your arm may swell a bit after surgery. To reduce swelling use pillows to elevate your arm so it is above your heart. Your doctor will tell you if you need to lightly wrap your arm with an ACE bandage. ° °Diet ° °Resume your normal diet. There are not special food restrictions following this procedure. In order to heal from your surgery, it is CRITICAL to get adequate nutrition. Your body requires vitamins, minerals, and protein. Vegetables are the best source of vitamins and minerals. Vegetables also provide the perfect balance of protein. Processed food has little nutritional value, so try to avoid this. ° °Medications ° °Resume taking all of your medications. If your incision is causing pain, you may take over-the counter pain relievers such as acetaminophen (Tylenol). If you were prescribed a stronger pain medication, please be aware these medications can cause nausea and constipation. Prevent  nausea by taking the medication with a snack or meal. Avoid constipation by drinking plenty of fluids and eating foods with high amount of fiber, such as fruits, vegetables, and grains. Do not take Tylenol if you are taking prescription pain medications. ° ° ° ° °Follow up °Your surgeon may want to see you in the office following your access surgery. If so, this will be arranged at the time of your surgery. ° °Please call us immediately for any of the following conditions: ° °Increased pain, redness, drainage (pus) from your incision site °Fever of 101 degrees or higher °Severe or worsening pain at your incision site °Hand pain or numbness. ° °Reduce your risk of vascular disease: ° °Stop smoking. If you would like help, call [facility] at 1-800-QUIT-NOW (1-800-784-8669) or [facility] at [phone] ° °Manage your cholesterol °Maintain a desired weight °Control your diabetes °Keep your blood pressure down ° °Dialysis ° °It will take several weeks to several months for your new dialysis access to be ready for use. Your surgeon will determine when it is OK to use it. Your nephrologist will continue to direct your dialysis. You can continue to use your Permcath until your new access is ready for use. ° °If you have any questions, please call the office at [phone]. ° °

## 2019-06-04 NOTE — Anesthesia Procedure Notes (Signed)
Procedure Name: [place] Performed by: Lowella Dell, CRNA Pre-anesthesia Checklist: Patient identified, Emergency Drugs available, Suction available, Patient being monitored and Timeout performed Patient Re-evaluated:Patient Re-evaluated prior to induction Oxygen Delivery Method: Simple face mask Induction Type: IV induction Placement Confirmation: positive ETCO2 Dental Injury: Teeth and Oropharynx as per pre-operative assessment

## 2019-06-04 NOTE — Op Note (Signed)
    OPERATIVE REPORT  DATE OF SURGERY: 06/04/2019  PATIENT: Hector Mahoney, 48 y.o. male MRN: [id number]  DOB: 02-28-1971  PRE-OPERATIVE DIAGNOSIS: End-stage renal disease  POST-OPERATIVE DIAGNOSIS:  Same  PROCEDURE: Left brachiocephalic AV fistula creation  SURGEON:  Curt Jews, M.D.  PHYSICIAN ASSISTANT: Gerri Lins, PA-C  ANESTHESIA: Local with sedation  EBL: per anesthesia record  Total I/O In: 650 [I.V.:650] Out: 20 [Blood:20]  BLOOD ADMINISTERED: none  DRAINS: none  SPECIMEN: none  COUNTS CORRECT:  YES  PATIENT DISPOSITION:  PACU - hemodynamically stable  PROCEDURE DETAILS: Patient was taken operating placed supine position where the area of the left arm was imaged with SonoSite ultrasound.  The patient had a very small basilic vein.  Did have a moderate sized cephalic vein at the antecubital space and proximally.  Did run somewhat deep to the fat.  Using local anesthesia incision was made over the antecubital space and carried down to the level of the cephalic vein at the antecubital space which was of good size.  Patient had multiple tributary branches and these were ligated with 3-0 and 4 silk ties and divided.  The vein was divided distally and was gently dilated and was felt to be adequate size for fistula attempt.  The brachial artery was exposed through the same incision.  The artery had minimal atherosclerotic change.  The artery was occluded proximally distally and was opened with an 11 blade and sent longstanding with Potts scissors.  The vein was cut to the appropriate length and was spatulated and sewn end-to-side to the artery with a running 6-0 Prolene suture.  Clamps were removed and good flow was noted through the fistula.  The wounds were irrigated with saline.  Hemostasis electrocautery.  The wounds were closed with 3-0 Vicryl in the subcutaneous and subcuticular tissue.  Sterile dressing was applied and the patient was transferred to the recovery  room in stable condition   Rosetta Posner, M.D., [facility] 06/04/2019 11:03 AM

## 2019-06-04 NOTE — Transfer of Care (Signed)
Immediate Anesthesia Transfer of Care Note  Patient: Hector Mahoney  Procedure(s) Performed: BRACHIAL-CEPHALIC ARTERIOVENOUS (AV) FISTULA CREATION LEFT ARM (Left Arm Lower)  Patient Location: PACU  Anesthesia Type:MAC  Level of Consciousness: awake and patient cooperative  Airway & Oxygen Therapy: Patient Spontanous Breathing and Patient connected to face mask oxygen  Post-op Assessment: Report given to RN and Post -op Vital signs reviewed and stable  Post vital signs: Reviewed and stable  Last Vitals:  Vitals Value Taken Time  BP    Temp    Pulse 97 06/04/19 1055  Resp    SpO2 97 % 06/04/19 1055  Vitals shown include unvalidated device data.  Last Pain:  Vitals:   06/04/19 0749  TempSrc: Oral  PainSc: 0-No pain      Patients Stated Pain Goal: 4 (15/87/27 6184)  Complications: No apparent anesthesia complications

## 2019-06-05 ENCOUNTER — Encounter (HOSPITAL_COMMUNITY): Payer: Self-pay | Admitting: Vascular Surgery

## 2019-06-05 DIAGNOSIS — N186 End stage renal disease: Secondary | ICD-10-CM | POA: Diagnosis not present

## 2019-06-05 DIAGNOSIS — T8249XA Other complication of vascular dialysis catheter, initial encounter: Secondary | ICD-10-CM | POA: Diagnosis not present

## 2019-06-05 DIAGNOSIS — Z992 Dependence on renal dialysis: Secondary | ICD-10-CM | POA: Diagnosis not present

## 2019-06-05 DIAGNOSIS — N2581 Secondary hyperparathyroidism of renal origin: Secondary | ICD-10-CM | POA: Diagnosis not present

## 2019-06-05 NOTE — Anesthesia Postprocedure Evaluation (Signed)
Anesthesia Post Note  Patient: Hector Mahoney  Procedure(s) Performed: BRACHIAL-CEPHALIC ARTERIOVENOUS (AV) FISTULA CREATION LEFT ARM (Left Arm Lower)     Patient location during evaluation: PACU Anesthesia Type: MAC Level of consciousness: awake and alert Pain management: pain level controlled Vital Signs Assessment: post-procedure vital signs reviewed and stable Respiratory status: spontaneous breathing, nonlabored ventilation, respiratory function stable and patient connected to nasal cannula oxygen Cardiovascular status: stable and blood pressure returned to baseline Postop Assessment: no apparent nausea or vomiting Anesthetic complications: no    Last Vitals:  Vitals:   06/04/19 1056 06/04/19 1115  BP: (!) 115/101 119/67  Pulse: 89   Resp: 14 16  Temp: (!) 36.1 C   SpO2: 99%     Last Pain:  Vitals:   06/04/19 1115  TempSrc:   PainSc: 0-No pain                 Tiajuana Amass

## 2019-06-08 DIAGNOSIS — T8249XA Other complication of vascular dialysis catheter, initial encounter: Secondary | ICD-10-CM | POA: Diagnosis not present

## 2019-06-08 DIAGNOSIS — N2581 Secondary hyperparathyroidism of renal origin: Secondary | ICD-10-CM | POA: Diagnosis not present

## 2019-06-08 DIAGNOSIS — Z992 Dependence on renal dialysis: Secondary | ICD-10-CM | POA: Diagnosis not present

## 2019-06-08 DIAGNOSIS — N186 End stage renal disease: Secondary | ICD-10-CM | POA: Diagnosis not present

## 2019-06-10 DIAGNOSIS — Z992 Dependence on renal dialysis: Secondary | ICD-10-CM | POA: Diagnosis not present

## 2019-06-10 DIAGNOSIS — T8249XA Other complication of vascular dialysis catheter, initial encounter: Secondary | ICD-10-CM | POA: Diagnosis not present

## 2019-06-10 DIAGNOSIS — N2581 Secondary hyperparathyroidism of renal origin: Secondary | ICD-10-CM | POA: Diagnosis not present

## 2019-06-10 DIAGNOSIS — N186 End stage renal disease: Secondary | ICD-10-CM | POA: Diagnosis not present

## 2019-06-12 DIAGNOSIS — T8249XA Other complication of vascular dialysis catheter, initial encounter: Secondary | ICD-10-CM | POA: Diagnosis not present

## 2019-06-12 DIAGNOSIS — N2581 Secondary hyperparathyroidism of renal origin: Secondary | ICD-10-CM | POA: Diagnosis not present

## 2019-06-12 DIAGNOSIS — Z992 Dependence on renal dialysis: Secondary | ICD-10-CM | POA: Diagnosis not present

## 2019-06-12 DIAGNOSIS — N186 End stage renal disease: Secondary | ICD-10-CM | POA: Diagnosis not present

## 2019-06-13 DIAGNOSIS — E1022 Type 1 diabetes mellitus with diabetic chronic kidney disease: Secondary | ICD-10-CM | POA: Diagnosis not present

## 2019-06-13 DIAGNOSIS — N186 End stage renal disease: Secondary | ICD-10-CM | POA: Diagnosis not present

## 2019-06-13 DIAGNOSIS — Z992 Dependence on renal dialysis: Secondary | ICD-10-CM | POA: Diagnosis not present

## 2019-06-15 DIAGNOSIS — T8249XA Other complication of vascular dialysis catheter, initial encounter: Secondary | ICD-10-CM | POA: Diagnosis not present

## 2019-06-15 DIAGNOSIS — N2581 Secondary hyperparathyroidism of renal origin: Secondary | ICD-10-CM | POA: Diagnosis not present

## 2019-06-15 DIAGNOSIS — N186 End stage renal disease: Secondary | ICD-10-CM | POA: Diagnosis not present

## 2019-06-15 DIAGNOSIS — Z992 Dependence on renal dialysis: Secondary | ICD-10-CM | POA: Diagnosis not present

## 2019-06-17 DIAGNOSIS — N2581 Secondary hyperparathyroidism of renal origin: Secondary | ICD-10-CM | POA: Diagnosis not present

## 2019-06-17 DIAGNOSIS — N186 End stage renal disease: Secondary | ICD-10-CM | POA: Diagnosis not present

## 2019-06-17 DIAGNOSIS — T8249XA Other complication of vascular dialysis catheter, initial encounter: Secondary | ICD-10-CM | POA: Diagnosis not present

## 2019-06-17 DIAGNOSIS — Z992 Dependence on renal dialysis: Secondary | ICD-10-CM | POA: Diagnosis not present

## 2019-06-18 DIAGNOSIS — H3582 Retinal ischemia: Secondary | ICD-10-CM | POA: Diagnosis not present

## 2019-06-18 DIAGNOSIS — E103513 Type 1 diabetes mellitus with proliferative diabetic retinopathy with macular edema, bilateral: Secondary | ICD-10-CM | POA: Diagnosis not present

## 2019-06-18 DIAGNOSIS — H35033 Hypertensive retinopathy, bilateral: Secondary | ICD-10-CM | POA: Diagnosis not present

## 2019-06-19 DIAGNOSIS — N2581 Secondary hyperparathyroidism of renal origin: Secondary | ICD-10-CM | POA: Diagnosis not present

## 2019-06-19 DIAGNOSIS — N186 End stage renal disease: Secondary | ICD-10-CM | POA: Diagnosis not present

## 2019-06-19 DIAGNOSIS — T8249XA Other complication of vascular dialysis catheter, initial encounter: Secondary | ICD-10-CM | POA: Diagnosis not present

## 2019-06-19 DIAGNOSIS — Z992 Dependence on renal dialysis: Secondary | ICD-10-CM | POA: Diagnosis not present

## 2019-06-22 DIAGNOSIS — Z992 Dependence on renal dialysis: Secondary | ICD-10-CM | POA: Diagnosis not present

## 2019-06-22 DIAGNOSIS — N186 End stage renal disease: Secondary | ICD-10-CM | POA: Diagnosis not present

## 2019-06-22 DIAGNOSIS — T8249XA Other complication of vascular dialysis catheter, initial encounter: Secondary | ICD-10-CM | POA: Diagnosis not present

## 2019-06-22 DIAGNOSIS — N2581 Secondary hyperparathyroidism of renal origin: Secondary | ICD-10-CM | POA: Diagnosis not present

## 2019-06-24 DIAGNOSIS — N2581 Secondary hyperparathyroidism of renal origin: Secondary | ICD-10-CM | POA: Diagnosis not present

## 2019-06-24 DIAGNOSIS — T8249XA Other complication of vascular dialysis catheter, initial encounter: Secondary | ICD-10-CM | POA: Diagnosis not present

## 2019-06-24 DIAGNOSIS — N186 End stage renal disease: Secondary | ICD-10-CM | POA: Diagnosis not present

## 2019-06-24 DIAGNOSIS — Z992 Dependence on renal dialysis: Secondary | ICD-10-CM | POA: Diagnosis not present

## 2019-06-25 ENCOUNTER — Ambulatory Visit (INDEPENDENT_AMBULATORY_CARE_PROVIDER_SITE_OTHER): Payer: Federal, State, Local not specified - PPO | Admitting: Internal Medicine

## 2019-06-25 ENCOUNTER — Other Ambulatory Visit: Payer: Self-pay | Admitting: Internal Medicine

## 2019-06-25 ENCOUNTER — Other Ambulatory Visit (INDEPENDENT_AMBULATORY_CARE_PROVIDER_SITE_OTHER): Payer: Federal, State, Local not specified - PPO

## 2019-06-25 ENCOUNTER — Encounter: Payer: Self-pay | Admitting: Internal Medicine

## 2019-06-25 ENCOUNTER — Other Ambulatory Visit: Payer: Self-pay

## 2019-06-25 VITALS — BP 146/92 | HR 102 | Temp 98.5°F | Ht 66.0 in | Wt 200.0 lb

## 2019-06-25 DIAGNOSIS — E559 Vitamin D deficiency, unspecified: Secondary | ICD-10-CM

## 2019-06-25 DIAGNOSIS — Z125 Encounter for screening for malignant neoplasm of prostate: Secondary | ICD-10-CM

## 2019-06-25 DIAGNOSIS — N185 Chronic kidney disease, stage 5: Secondary | ICD-10-CM | POA: Diagnosis not present

## 2019-06-25 DIAGNOSIS — Z0000 Encounter for general adult medical examination without abnormal findings: Secondary | ICD-10-CM

## 2019-06-25 DIAGNOSIS — E1022 Type 1 diabetes mellitus with diabetic chronic kidney disease: Secondary | ICD-10-CM

## 2019-06-25 DIAGNOSIS — D509 Iron deficiency anemia, unspecified: Secondary | ICD-10-CM

## 2019-06-25 DIAGNOSIS — E538 Deficiency of other specified B group vitamins: Secondary | ICD-10-CM | POA: Diagnosis not present

## 2019-06-25 LAB — CBC WITH DIFFERENTIAL/PLATELET
Basophils Absolute: 0 10*3/uL (ref 0.0–0.1)
Basophils Relative: 0.5 % (ref 0.0–3.0)
Eosinophils Absolute: 0.3 10*3/uL (ref 0.0–0.7)
Eosinophils Relative: 3.2 % (ref 0.0–5.0)
HCT: 37.5 % — ABNORMAL LOW (ref 39.0–52.0)
Hemoglobin: 12.3 g/dL — ABNORMAL LOW (ref 13.0–17.0)
Lymphocytes Relative: 27.4 % (ref 12.0–46.0)
Lymphs Abs: 2.2 10*3/uL (ref 0.7–4.0)
MCHC: 32.8 g/dL (ref 30.0–36.0)
MCV: 83.4 fl (ref 78.0–100.0)
Monocytes Absolute: 0.8 10*3/uL (ref 0.1–1.0)
Monocytes Relative: 9.4 % (ref 3.0–12.0)
Neutro Abs: 4.9 10*3/uL (ref 1.4–7.7)
Neutrophils Relative %: 59.5 % (ref 43.0–77.0)
Platelets: 261 10*3/uL (ref 150.0–400.0)
RBC: 4.49 Mil/uL (ref 4.22–5.81)
RDW: 16.5 % — ABNORMAL HIGH (ref 11.5–15.5)
WBC: 8.2 10*3/uL (ref 4.0–10.5)

## 2019-06-25 LAB — LIPID PANEL
Cholesterol: 183 mg/dL (ref 0–200)
HDL: 58.4 mg/dL (ref >39.00)
NonHDL: 124.4
Total CHOL/HDL Ratio: 3
Triglycerides: 214 mg/dL — ABNORMAL HIGH (ref 0.0–149.0)
VLDL: 42.8 mg/dL — ABNORMAL HIGH (ref 0.0–40.0)

## 2019-06-25 LAB — IBC PANEL
Iron: 59 ug/dL (ref 42–165)
Saturation Ratios: 21 % (ref 20.0–50.0)
Transferrin: 201 mg/dL — ABNORMAL LOW (ref 212.0–360.0)

## 2019-06-25 LAB — BASIC METABOLIC PANEL
BUN: 51 mg/dL — ABNORMAL HIGH (ref 6–23)
CO2: 29 mEq/L (ref 19–32)
Calcium: 10 mg/dL (ref 8.4–10.5)
Chloride: 97 mEq/L (ref 96–112)
Creatinine, Ser: 8.83 mg/dL (ref 0.40–1.50)
GFR: 7.86 mL/min — CL (ref >60.00)
Glucose, Bld: 173 mg/dL — ABNORMAL HIGH (ref 70–99)
Potassium: 4.3 mEq/L (ref 3.5–5.1)
Sodium: 141 mEq/L (ref 135–145)

## 2019-06-25 LAB — HEPATIC FUNCTION PANEL
ALT: 15 U/L (ref 0–53)
AST: 18 U/L (ref 0–37)
Albumin: 4.7 g/dL (ref 3.5–5.2)
Alkaline Phosphatase: 120 U/L — ABNORMAL HIGH (ref 39–117)
Bilirubin, Direct: 0.1 mg/dL (ref 0.0–0.3)
Total Bilirubin: 0.4 mg/dL (ref 0.2–1.2)
Total Protein: 8.1 g/dL (ref 6.0–8.3)

## 2019-06-25 LAB — VITAMIN D 25 HYDROXY (VIT D DEFICIENCY, FRACTURES): VITD: 11.76 ng/mL — ABNORMAL LOW (ref 30.00–100.00)

## 2019-06-25 LAB — FERRITIN: Ferritin: 667.5 ng/mL — ABNORMAL HIGH (ref 22.0–322.0)

## 2019-06-25 LAB — LDL CHOLESTEROL, DIRECT: Direct LDL: 80 mg/dL

## 2019-06-25 LAB — VITAMIN B12: Vitamin B-12: >1500 pg/mL — ABNORMAL HIGH (ref 211–911)

## 2019-06-25 LAB — TSH: TSH: 2.03 u[IU]/mL (ref 0.35–4.50)

## 2019-06-25 LAB — PSA: PSA: 1.39 ng/mL (ref 0.10–4.00)

## 2019-06-25 LAB — HEMOGLOBIN A1C: Hgb A1c MFr Bld: 9.3 % — ABNORMAL HIGH (ref 4.6–6.5)

## 2019-06-25 MED ORDER — VITAMIN D (ERGOCALCIFEROL) 1.25 MG (50000 UNIT) PO CAPS: 50000.0000 [IU] | ORAL_CAPSULE | ORAL | 0 refills | Status: DC

## 2019-06-25 MED ORDER — INSULIN ASPART 100 UNIT/ML ~~LOC~~ SOLN: 8.0000 [IU] | Freq: Three times a day (TID) | SUBCUTANEOUS | 11 refills | Status: DC

## 2019-06-25 NOTE — Assessment & Plan Note (Signed)
For iron lab

## 2019-06-25 NOTE — Assessment & Plan Note (Signed)
For a1c, f/u endo

## 2019-06-25 NOTE — Patient Instructions (Signed)

## 2019-06-25 NOTE — Progress Notes (Signed)
Subjective:    Patient ID: Hector Mahoney, male    DOB: [birth year], 48 y.o.   MRN: [id number]  HPI  Here for wellness and f/u;  Overall doing ok;  Pt denies Chest pain, worsening SOB, DOE, wheezing, orthopnea, PND, worsening LE edema, palpitations, dizziness or syncope.  Pt denies neurological change such as new headache, facial or extremity weakness.  Pt denies polydipsia, polyuria, or low sugar symptoms. Pt states overall good compliance with treatment and medications, good tolerability, and has been trying to follow appropriate diet.  Pt denies worsening depressive symptoms, suicidal ideation or panic. No fever, night sweats, wt loss, loss of appetite, or other constitutional symptoms.  Pt states good ability with ADL's, has low fall risk, home safety reviewed and adequate, no other significant changes in hearing or vision, and only occasionally active with exercise. Due for iron lab f/u.  Antivert conts to help the veftigo.   Past Medical History:  Diagnosis Date   ABSCESS, TOOTH 04/23/2009   AKI (acute kidney injury) ([place]) 08/2015   Anemia    CHF (congestive heart failure) ([place])    Chronic kidney disease    dialysis M-W-F   DIABETES MELLITUS, TYPE I 07/30/2007   Esophageal reflux 04/27/2009   FATIGUE 04/27/2009   Resolved per patient 06/03/19, no longer a problem   HYPERLIPIDEMIA 04/24/2007   HYPERTENSION 04/24/2007   INSOMNIA-SLEEP DISORDER-UNSPEC 04/27/2009   resolved, no longer a problem per patient 06/03/19   LUMBAR STRAIN, ACUTE 12/11/2008   RASH-NONVESICULAR 03/11/2008   Past Surgical History:  Procedure Laterality Date   AV FISTULA PLACEMENT Left 06/04/2019   Procedure: BRACHIAL-CEPHALIC ARTERIOVENOUS (AV) FISTULA CREATION LEFT ARM;  Surgeon: Rosetta Posner, MD;  Location: [facility] OR;  Service: Vascular;  Laterality: Left;   COLONOSCOPY     polyp   IR FLUORO GUIDE CV LINE RIGHT  10/22/2017   IR US GUIDE VASC ACCESS RIGHT  10/22/2017   MOUTH SURGERY     tooth ext     reports that he quit smoking about 4 years ago. His smoking use included cigarettes. He has never used smokeless tobacco. He reports current alcohol use. He reports that he does not use drugs. family history includes Colon cancer in his maternal uncle; Colon polyps in his maternal uncle; Diabetes in his maternal aunt and mother; Heart disease in his maternal uncle; Hypertension in his mother; Lung cancer in his maternal grandfather. No Known Allergies Current Outpatient Medications on File Prior to Visit  Medication Sig Dispense Refill   amLODipine (NORVASC) 5 MG tablet Take 1 tablet (5 mg total) by mouth daily. (Patient taking differently: Take 5 mg by mouth at bedtime. ) 30 tablet 1   aspirin EC 81 MG EC tablet Take 1 tablet (81 mg total) by mouth daily. 30 tablet 0   calcitRIOL (ROCALTROL) 0.25 MCG capsule Take 1 capsule (0.25 mcg total) by mouth every Monday, Wednesday, and Friday at 6 PM. 15 capsule 0   calcium acetate (PHOSLO) 667 MG capsule Take 2 capsules (1,334 mg total) by mouth 3 (three) times daily with meals. (Patient taking differently: Take 1,334-2,001 mg by mouth See admin instructions. Take 2001 mg with each meal and 1334 mg with each snack) 180 capsule 3   dicyclomine (BENTYL) 20 MG tablet Take 1 tablet (20 mg total) by mouth 3 (three) times daily before meals. 90 tablet 3   insulin glargine (LANTUS) 100 UNIT/ML injection Inject 0.25 mLs (25 Units total) into the skin at bedtime. 20 mL  5   isosorbide mononitrate (IMDUR) 30 MG 24 hr tablet TAKE ONE-HALF TABLET BY MOUTH ONCE DAILY (Patient taking differently: Take 15 mg by mouth at bedtime. ) 90 tablet 1   meclizine (ANTIVERT) 12.5 MG tablet Take 1 tablet (12.5 mg total) by mouth 3 (three) times daily as needed for dizziness. 40 tablet 2   Menthol-Methyl Salicylate (MUSCLE RUB) 10-15 % CREA Apply 1 application topically as needed for muscle pain (for leg cramps). 35 g 1   omeprazole (PRILOSEC) 40 MG capsule Take 1 capsule  (40 mg total) by mouth daily. (Patient taking differently: Take 40 mg by mouth 2 (two) times daily. ) 60 capsule 3   ondansetron (ZOFRAN-ODT) 8 MG disintegrating tablet Take 8 mg by mouth 3 (three) times daily as needed for nausea/vomiting.     oxyCODONE-acetaminophen (PERCOCET/ROXICET) 5-325 MG tablet Take 1 tablet by mouth every 6 (six) hours as needed. 6 tablet 0   rOPINIRole (REQUIP) 0.25 MG tablet TAKE 1 TABLET BY MOUTH AT BEDTIME (Patient taking differently: Take 0.25 mg by mouth at bedtime. ) 55 tablet 2   rosuvastatin (CRESTOR) 10 MG tablet Take 1 tablet (10 mg total) by mouth daily. 30 tablet 3   No current facility-administered medications on file prior to visit.    Review of Systems Constitutional: Negative for other unusual diaphoresis, sweats, appetite or weight changes HENT: Negative for other worsening hearing loss, ear pain, facial swelling, mouth sores or neck stiffness.   Eyes: Negative for other worsening pain, redness or other visual disturbance.  Respiratory: Negative for other stridor or swelling Cardiovascular: Negative for other palpitations or other chest pain  Gastrointestinal: Negative for worsening diarrhea or loose stools, blood in stool, distention or other pain Genitourinary: Negative for hematuria, flank pain or other change in urine volume.  Musculoskeletal: Negative for myalgias or other joint swelling.  Skin: Negative for other color change, or other wound or worsening drainage.  Neurological: Negative for other syncope or numbness. Hematological: Negative for other adenopathy or swelling Psychiatric/Behavioral: Negative for hallucinations, other worsening agitation, SI, self-injury, or new decreased concentration All otherwise neg per pt     Objective:   Physical Exam BP (!) 146/92    Pulse (!) 102    Temp 98.5 F (36.9 C) (Oral)    Ht 5\' 6"  (1.676 m)    Wt 200 lb (90.7 kg)    SpO2 98%    BMI 32.28 kg/m  VS noted,  Constitutional: Pt is oriented  to person, place, and time. Appears well-developed and well-nourished, in no significant distress and comfortable Head: Normocephalic and atraumatic  Eyes: Conjunctivae and EOM are normal. Pupils are equal, round, and reactive to light Right Ear: External ear normal without discharge Left Ear: External ear normal without discharge Nose: Nose without discharge or deformity Mouth/Throat: Oropharynx is without other ulcerations and moist  Neck: Normal range of motion. Neck supple. No JVD present. No tracheal deviation present or significant neck LA or mass Cardiovascular: Normal rate, regular rhythm, normal heart sounds and intact distal pulses.   Pulmonary/Chest: WOB normal and breath sounds without rales or wheezing  Abdominal: Soft. Bowel sounds are normal. NT. No HSM  Musculoskeletal: Normal range of motion. Exhibits no edema Lymphadenopathy: Has no other cervical adenopathy.  Neurological: Pt is alert and oriented to person, place, and time. Pt has normal reflexes. No cranial nerve deficit. Motor grossly intact, Gait intact Skin: Skin is warm and dry. No rash noted or new ulcerations Psychiatric:  Has normal mood  and affect. Behavior is normal without agitation All otherwise neg per pt Lab Results  Component Value Date   WBC 8.2 06/25/2019   HGB 12.3 (L) 06/25/2019   HCT 37.5 (L) 06/25/2019   PLT 261.0 06/25/2019   GLUCOSE 173 (H) 06/25/2019   CHOL 183 06/25/2019   TRIG 214.0 (H) 06/25/2019   HDL 58.40 06/25/2019   LDLDIRECT 80.0 06/25/2019   ALT 15 06/25/2019   AST 18 06/25/2019   NA 141 06/25/2019   K 4.3 06/25/2019   CL 97 06/25/2019   CREATININE 8.83 (HH) 06/25/2019   BUN 51 (H) 06/25/2019   CO2 29 06/25/2019   TSH 2.03 06/25/2019   PSA 1.39 06/25/2019   INR 1.05 10/21/2017   HGBA1C 9.3 (H) 06/25/2019       Assessment & Plan:

## 2019-06-25 NOTE — Assessment & Plan Note (Signed)

## 2019-06-26 DIAGNOSIS — N2581 Secondary hyperparathyroidism of renal origin: Secondary | ICD-10-CM | POA: Diagnosis not present

## 2019-06-26 DIAGNOSIS — T8249XA Other complication of vascular dialysis catheter, initial encounter: Secondary | ICD-10-CM | POA: Diagnosis not present

## 2019-06-26 DIAGNOSIS — N186 End stage renal disease: Secondary | ICD-10-CM | POA: Diagnosis not present

## 2019-06-26 DIAGNOSIS — Z992 Dependence on renal dialysis: Secondary | ICD-10-CM | POA: Diagnosis not present

## 2019-06-29 DIAGNOSIS — N186 End stage renal disease: Secondary | ICD-10-CM | POA: Diagnosis not present

## 2019-06-29 DIAGNOSIS — Z7682 Awaiting organ transplant status: Secondary | ICD-10-CM | POA: Diagnosis not present

## 2019-06-29 DIAGNOSIS — T8249XA Other complication of vascular dialysis catheter, initial encounter: Secondary | ICD-10-CM | POA: Diagnosis not present

## 2019-06-29 DIAGNOSIS — N2581 Secondary hyperparathyroidism of renal origin: Secondary | ICD-10-CM | POA: Diagnosis not present

## 2019-06-29 DIAGNOSIS — Z992 Dependence on renal dialysis: Secondary | ICD-10-CM | POA: Diagnosis not present

## 2019-07-01 DIAGNOSIS — T8249XA Other complication of vascular dialysis catheter, initial encounter: Secondary | ICD-10-CM | POA: Diagnosis not present

## 2019-07-01 DIAGNOSIS — N186 End stage renal disease: Secondary | ICD-10-CM | POA: Diagnosis not present

## 2019-07-01 DIAGNOSIS — Z992 Dependence on renal dialysis: Secondary | ICD-10-CM | POA: Diagnosis not present

## 2019-07-01 DIAGNOSIS — N2581 Secondary hyperparathyroidism of renal origin: Secondary | ICD-10-CM | POA: Diagnosis not present

## 2019-07-03 DIAGNOSIS — N2581 Secondary hyperparathyroidism of renal origin: Secondary | ICD-10-CM | POA: Diagnosis not present

## 2019-07-03 DIAGNOSIS — N186 End stage renal disease: Secondary | ICD-10-CM | POA: Diagnosis not present

## 2019-07-03 DIAGNOSIS — Z992 Dependence on renal dialysis: Secondary | ICD-10-CM | POA: Diagnosis not present

## 2019-07-03 DIAGNOSIS — T8249XA Other complication of vascular dialysis catheter, initial encounter: Secondary | ICD-10-CM | POA: Diagnosis not present

## 2019-07-05 DIAGNOSIS — Z992 Dependence on renal dialysis: Secondary | ICD-10-CM | POA: Diagnosis not present

## 2019-07-05 DIAGNOSIS — T8249XA Other complication of vascular dialysis catheter, initial encounter: Secondary | ICD-10-CM | POA: Diagnosis not present

## 2019-07-05 DIAGNOSIS — N2581 Secondary hyperparathyroidism of renal origin: Secondary | ICD-10-CM | POA: Diagnosis not present

## 2019-07-05 DIAGNOSIS — N186 End stage renal disease: Secondary | ICD-10-CM | POA: Diagnosis not present

## 2019-07-07 DIAGNOSIS — T8249XA Other complication of vascular dialysis catheter, initial encounter: Secondary | ICD-10-CM | POA: Diagnosis not present

## 2019-07-07 DIAGNOSIS — N186 End stage renal disease: Secondary | ICD-10-CM | POA: Diagnosis not present

## 2019-07-07 DIAGNOSIS — N2581 Secondary hyperparathyroidism of renal origin: Secondary | ICD-10-CM | POA: Diagnosis not present

## 2019-07-07 DIAGNOSIS — Z992 Dependence on renal dialysis: Secondary | ICD-10-CM | POA: Diagnosis not present

## 2019-07-10 ENCOUNTER — Other Ambulatory Visit: Payer: Self-pay

## 2019-07-10 DIAGNOSIS — Z992 Dependence on renal dialysis: Secondary | ICD-10-CM

## 2019-07-10 DIAGNOSIS — N2581 Secondary hyperparathyroidism of renal origin: Secondary | ICD-10-CM | POA: Diagnosis not present

## 2019-07-10 DIAGNOSIS — T8249XA Other complication of vascular dialysis catheter, initial encounter: Secondary | ICD-10-CM | POA: Diagnosis not present

## 2019-07-10 DIAGNOSIS — N186 End stage renal disease: Secondary | ICD-10-CM | POA: Diagnosis not present

## 2019-07-13 DIAGNOSIS — T8249XA Other complication of vascular dialysis catheter, initial encounter: Secondary | ICD-10-CM | POA: Diagnosis not present

## 2019-07-13 DIAGNOSIS — N186 End stage renal disease: Secondary | ICD-10-CM | POA: Diagnosis not present

## 2019-07-13 DIAGNOSIS — N2581 Secondary hyperparathyroidism of renal origin: Secondary | ICD-10-CM | POA: Diagnosis not present

## 2019-07-13 DIAGNOSIS — E1022 Type 1 diabetes mellitus with diabetic chronic kidney disease: Secondary | ICD-10-CM | POA: Diagnosis not present

## 2019-07-13 DIAGNOSIS — Z992 Dependence on renal dialysis: Secondary | ICD-10-CM | POA: Diagnosis not present

## 2019-07-14 ENCOUNTER — Encounter: Payer: Federal, State, Local not specified - PPO | Admitting: Vascular Surgery

## 2019-07-14 ENCOUNTER — Encounter (HOSPITAL_COMMUNITY): Payer: Federal, State, Local not specified - PPO

## 2019-07-14 ENCOUNTER — Ambulatory Visit (HOSPITAL_COMMUNITY)
Admission: RE | Admit: 2019-07-14 | Discharge: 2019-07-14 | Disposition: A | Discharge status: Home / Self Care | Payer: Federal, State, Local not specified - PPO | Source: Ambulatory Visit | Attending: Vascular Surgery | Admitting: Vascular Surgery

## 2019-07-14 ENCOUNTER — Ambulatory Visit (INDEPENDENT_AMBULATORY_CARE_PROVIDER_SITE_OTHER): Payer: Self-pay | Admitting: Physician Assistant

## 2019-07-14 ENCOUNTER — Other Ambulatory Visit: Payer: Self-pay

## 2019-07-14 VITALS — BP 118/80 | HR 94 | Temp 98.0°F | Resp 14 | Ht 66.0 in | Wt 204.2 lb

## 2019-07-14 DIAGNOSIS — N186 End stage renal disease: Secondary | ICD-10-CM | POA: Diagnosis not present

## 2019-07-14 DIAGNOSIS — Z992 Dependence on renal dialysis: Secondary | ICD-10-CM

## 2019-07-14 NOTE — Progress Notes (Signed)
POST OPERATIVE OFFICE NOTE    CC:  F/u for surgery  HPI:  This is a 48 y.o. male who is s/p left BC AVF on 06/04/2019 by Dr. Donnetta Hutching.  He returns today for follow up.  He denies any pain in his left hand.  He dialyzes on M/W/F at the [facility] location.  It is documented that he had his catheter placed in March 2019 by IR.  Unsure if he has had this replaced since, but not placed by vascular surgery.   No Known Allergies  Current Outpatient Medications  Medication Sig Dispense Refill  . amLODipine (NORVASC) 5 MG tablet Take 1 tablet (5 mg total) by mouth daily. (Patient taking differently: Take 5 mg by mouth at bedtime. ) 30 tablet 1  . aspirin EC 81 MG EC tablet Take 1 tablet (81 mg total) by mouth daily. 30 tablet 0  . calcitRIOL (ROCALTROL) 0.25 MCG capsule Take 1 capsule (0.25 mcg total) by mouth every Monday, Wednesday, and Friday at 6 PM. 15 capsule 0  . calcium acetate (PHOSLO) 667 MG capsule Take 2 capsules (1,334 mg total) by mouth 3 (three) times daily with meals. (Patient taking differently: Take 1,334-2,001 mg by mouth See admin instructions. Take 2001 mg with each meal and 1334 mg with each snack) 180 capsule 3  . dicyclomine (BENTYL) 20 MG tablet Take 1 tablet (20 mg total) by mouth 3 (three) times daily before meals. 90 tablet 3  . insulin aspart (NOVOLOG) 100 UNIT/ML injection Inject 8-12 Units into the skin 3 (three) times daily with meals. 10 mL 11  . insulin glargine (LANTUS) 100 UNIT/ML injection Inject 0.25 mLs (25 Units total) into the skin at bedtime. 20 mL 5  . isosorbide mononitrate (IMDUR) 30 MG 24 hr tablet TAKE ONE-HALF TABLET BY MOUTH ONCE DAILY (Patient taking differently: Take 15 mg by mouth at bedtime. ) 90 tablet 1  . meclizine (ANTIVERT) 12.5 MG tablet Take 1 tablet (12.5 mg total) by mouth 3 (three) times daily as needed for dizziness. 40 tablet 2  . Menthol-Methyl Salicylate (MUSCLE RUB) 10-15 % CREA Apply 1 application topically as needed for muscle pain (for  leg cramps). 35 g 1  . omeprazole (PRILOSEC) 40 MG capsule Take 1 capsule (40 mg total) by mouth daily. (Patient taking differently: Take 40 mg by mouth 2 (two) times daily. ) 60 capsule 3  . ondansetron (ZOFRAN-ODT) 8 MG disintegrating tablet Take 8 mg by mouth 3 (three) times daily as needed for nausea/vomiting.    Marland Kitchen oxyCODONE-acetaminophen (PERCOCET/ROXICET) 5-325 MG tablet Take 1 tablet by mouth every 6 (six) hours as needed. 6 tablet 0  . rOPINIRole (REQUIP) 0.25 MG tablet TAKE 1 TABLET BY MOUTH AT BEDTIME (Patient taking differently: Take 0.25 mg by mouth at bedtime. ) 55 tablet 2  . rosuvastatin (CRESTOR) 10 MG tablet Take 1 tablet (10 mg total) by mouth daily. 30 tablet 3  . Vitamin D, Ergocalciferol, (DRISDOL) 1.25 MG (50000 UT) CAPS capsule Take 1 capsule (50,000 Units total) by mouth every 7 (seven) days. 12 capsule 0   No current facility-administered medications for this visit.      ROS:  See HPI  Physical Exam:  Today's Vitals   07/14/19 1311  BP: 118/80  Pulse: 94  Resp: 14  Temp: 98 F (36.7 C)  TempSrc: Temporal  SpO2: 97%  Weight: 204 lb 3.2 oz (92.6 kg)  Height: 5\' 6"  (1.676 m)   Body mass index is 32.96 kg/m.  Incision:  Healed nicely Extremities:  Easily palpable left radial pulse; motor and sensation are in tact.  The fistula has an excellent thrill throughout and easily palpable.    Dialysis duplex 07/14/2019: +------------+----------+-------------+----------+----------------+ OUTFLOW VEINPSV (cm/s)Diameter (cm)Depth (cm)    Describe     +------------+----------+-------------+----------+----------------+ Shoulder       158        0.39        1.49                    +------------+----------+-------------+----------+----------------+ Prox UA        158        0.53        0.74                    +------------+----------+-------------+----------+----------------+ Mid UA         146        0.39        0.49                     +------------+----------+-------------+----------+----------------+ Dist UA        471        0.58        0.45   competing branch +------------+----------+-------------+----------+----------------+ AC Fossa       517        0.28        0.86                    +------------+----------+-------------+----------+----------------+    Assessment/Plan:  This is a 48 y.o. male who is s/p:  left BC AVF on 06/04/2019 by Dr. Donnetta Hutching  -pt does not have evidence of steal.   -the fistula has an excellent thrill.  There are a couple of areas of the fistula that have not quite matured.  Discussed with Dr. Donnetta Hutching and will have him return in a couple of months with repeat duplex and see PA on Dr. Luther Parody clinic day.  Pt is in agreement with this.     Leontine Locket, PA-C [facility] [phone]  Clinic MD:  Early

## 2019-07-15 DIAGNOSIS — Z992 Dependence on renal dialysis: Secondary | ICD-10-CM | POA: Diagnosis not present

## 2019-07-15 DIAGNOSIS — N186 End stage renal disease: Secondary | ICD-10-CM | POA: Diagnosis not present

## 2019-07-15 DIAGNOSIS — N2581 Secondary hyperparathyroidism of renal origin: Secondary | ICD-10-CM | POA: Diagnosis not present

## 2019-07-15 DIAGNOSIS — T8249XA Other complication of vascular dialysis catheter, initial encounter: Secondary | ICD-10-CM | POA: Diagnosis not present

## 2019-07-17 DIAGNOSIS — N186 End stage renal disease: Secondary | ICD-10-CM | POA: Diagnosis not present

## 2019-07-17 DIAGNOSIS — N2581 Secondary hyperparathyroidism of renal origin: Secondary | ICD-10-CM | POA: Diagnosis not present

## 2019-07-17 DIAGNOSIS — T8249XA Other complication of vascular dialysis catheter, initial encounter: Secondary | ICD-10-CM | POA: Diagnosis not present

## 2019-07-17 DIAGNOSIS — Z992 Dependence on renal dialysis: Secondary | ICD-10-CM | POA: Diagnosis not present

## 2019-07-20 DIAGNOSIS — N186 End stage renal disease: Secondary | ICD-10-CM | POA: Diagnosis not present

## 2019-07-20 DIAGNOSIS — N2581 Secondary hyperparathyroidism of renal origin: Secondary | ICD-10-CM | POA: Diagnosis not present

## 2019-07-20 DIAGNOSIS — E1121 Type 2 diabetes mellitus with diabetic nephropathy: Secondary | ICD-10-CM | POA: Diagnosis not present

## 2019-07-20 DIAGNOSIS — Z992 Dependence on renal dialysis: Secondary | ICD-10-CM | POA: Diagnosis not present

## 2019-07-20 DIAGNOSIS — T8249XA Other complication of vascular dialysis catheter, initial encounter: Secondary | ICD-10-CM | POA: Diagnosis not present

## 2019-07-22 DIAGNOSIS — Z992 Dependence on renal dialysis: Secondary | ICD-10-CM | POA: Diagnosis not present

## 2019-07-22 DIAGNOSIS — N2581 Secondary hyperparathyroidism of renal origin: Secondary | ICD-10-CM | POA: Diagnosis not present

## 2019-07-22 DIAGNOSIS — N186 End stage renal disease: Secondary | ICD-10-CM | POA: Diagnosis not present

## 2019-07-22 DIAGNOSIS — E1121 Type 2 diabetes mellitus with diabetic nephropathy: Secondary | ICD-10-CM | POA: Diagnosis not present

## 2019-07-22 DIAGNOSIS — T8249XA Other complication of vascular dialysis catheter, initial encounter: Secondary | ICD-10-CM | POA: Diagnosis not present

## 2019-07-24 DIAGNOSIS — N2581 Secondary hyperparathyroidism of renal origin: Secondary | ICD-10-CM | POA: Diagnosis not present

## 2019-07-24 DIAGNOSIS — T8249XA Other complication of vascular dialysis catheter, initial encounter: Secondary | ICD-10-CM | POA: Diagnosis not present

## 2019-07-24 DIAGNOSIS — E1121 Type 2 diabetes mellitus with diabetic nephropathy: Secondary | ICD-10-CM | POA: Diagnosis not present

## 2019-07-24 DIAGNOSIS — Z992 Dependence on renal dialysis: Secondary | ICD-10-CM | POA: Diagnosis not present

## 2019-07-24 DIAGNOSIS — N186 End stage renal disease: Secondary | ICD-10-CM | POA: Diagnosis not present

## 2019-07-27 DIAGNOSIS — N186 End stage renal disease: Secondary | ICD-10-CM | POA: Diagnosis not present

## 2019-07-27 DIAGNOSIS — N2581 Secondary hyperparathyroidism of renal origin: Secondary | ICD-10-CM | POA: Diagnosis not present

## 2019-07-27 DIAGNOSIS — T8249XA Other complication of vascular dialysis catheter, initial encounter: Secondary | ICD-10-CM | POA: Diagnosis not present

## 2019-07-27 DIAGNOSIS — Z23 Encounter for immunization: Secondary | ICD-10-CM | POA: Diagnosis not present

## 2019-07-27 DIAGNOSIS — Z992 Dependence on renal dialysis: Secondary | ICD-10-CM | POA: Diagnosis not present

## 2019-07-29 DIAGNOSIS — Z992 Dependence on renal dialysis: Secondary | ICD-10-CM | POA: Diagnosis not present

## 2019-07-29 DIAGNOSIS — Z23 Encounter for immunization: Secondary | ICD-10-CM | POA: Diagnosis not present

## 2019-07-29 DIAGNOSIS — N186 End stage renal disease: Secondary | ICD-10-CM | POA: Diagnosis not present

## 2019-07-29 DIAGNOSIS — T8249XA Other complication of vascular dialysis catheter, initial encounter: Secondary | ICD-10-CM | POA: Diagnosis not present

## 2019-07-29 DIAGNOSIS — N2581 Secondary hyperparathyroidism of renal origin: Secondary | ICD-10-CM | POA: Diagnosis not present

## 2019-07-31 DIAGNOSIS — N186 End stage renal disease: Secondary | ICD-10-CM | POA: Diagnosis not present

## 2019-07-31 DIAGNOSIS — Z992 Dependence on renal dialysis: Secondary | ICD-10-CM | POA: Diagnosis not present

## 2019-07-31 DIAGNOSIS — Z23 Encounter for immunization: Secondary | ICD-10-CM | POA: Diagnosis not present

## 2019-07-31 DIAGNOSIS — N2581 Secondary hyperparathyroidism of renal origin: Secondary | ICD-10-CM | POA: Diagnosis not present

## 2019-07-31 DIAGNOSIS — T8249XA Other complication of vascular dialysis catheter, initial encounter: Secondary | ICD-10-CM | POA: Diagnosis not present

## 2019-08-03 DIAGNOSIS — N186 End stage renal disease: Secondary | ICD-10-CM | POA: Diagnosis not present

## 2019-08-03 DIAGNOSIS — N2581 Secondary hyperparathyroidism of renal origin: Secondary | ICD-10-CM | POA: Diagnosis not present

## 2019-08-03 DIAGNOSIS — Z992 Dependence on renal dialysis: Secondary | ICD-10-CM | POA: Diagnosis not present

## 2019-08-03 DIAGNOSIS — T8249XA Other complication of vascular dialysis catheter, initial encounter: Secondary | ICD-10-CM | POA: Diagnosis not present

## 2019-08-04 ENCOUNTER — Other Ambulatory Visit: Payer: Self-pay | Admitting: *Deleted

## 2019-08-04 DIAGNOSIS — N186 End stage renal disease: Secondary | ICD-10-CM

## 2019-08-04 DIAGNOSIS — Z992 Dependence on renal dialysis: Secondary | ICD-10-CM

## 2019-08-05 DIAGNOSIS — Z992 Dependence on renal dialysis: Secondary | ICD-10-CM | POA: Diagnosis not present

## 2019-08-05 DIAGNOSIS — N186 End stage renal disease: Secondary | ICD-10-CM | POA: Diagnosis not present

## 2019-08-05 DIAGNOSIS — N2581 Secondary hyperparathyroidism of renal origin: Secondary | ICD-10-CM | POA: Diagnosis not present

## 2019-08-05 DIAGNOSIS — T8249XA Other complication of vascular dialysis catheter, initial encounter: Secondary | ICD-10-CM | POA: Diagnosis not present

## 2019-08-08 DIAGNOSIS — Z992 Dependence on renal dialysis: Secondary | ICD-10-CM | POA: Diagnosis not present

## 2019-08-08 DIAGNOSIS — T8249XA Other complication of vascular dialysis catheter, initial encounter: Secondary | ICD-10-CM | POA: Diagnosis not present

## 2019-08-08 DIAGNOSIS — N186 End stage renal disease: Secondary | ICD-10-CM | POA: Diagnosis not present

## 2019-08-08 DIAGNOSIS — N2581 Secondary hyperparathyroidism of renal origin: Secondary | ICD-10-CM | POA: Diagnosis not present

## 2019-08-10 ENCOUNTER — Other Ambulatory Visit: Payer: Self-pay | Admitting: Gastroenterology

## 2019-08-10 DIAGNOSIS — T8249XA Other complication of vascular dialysis catheter, initial encounter: Secondary | ICD-10-CM | POA: Diagnosis not present

## 2019-08-10 DIAGNOSIS — N186 End stage renal disease: Secondary | ICD-10-CM | POA: Diagnosis not present

## 2019-08-10 DIAGNOSIS — N2581 Secondary hyperparathyroidism of renal origin: Secondary | ICD-10-CM | POA: Diagnosis not present

## 2019-08-10 DIAGNOSIS — Z992 Dependence on renal dialysis: Secondary | ICD-10-CM | POA: Diagnosis not present

## 2019-08-10 MED ORDER — DICYCLOMINE HCL 20 MG PO TABS: 20.0000 mg | ORAL_TABLET | Freq: Three times a day (TID) | ORAL | 0 refills | Status: DC

## 2019-08-10 NOTE — Telephone Encounter (Signed)
Dicyclomine refilled as requested.

## 2019-08-11 ENCOUNTER — Ambulatory Visit (INDEPENDENT_AMBULATORY_CARE_PROVIDER_SITE_OTHER): Payer: Federal, State, Local not specified - PPO | Admitting: Internal Medicine

## 2019-08-11 ENCOUNTER — Other Ambulatory Visit: Payer: Self-pay

## 2019-08-11 ENCOUNTER — Encounter: Payer: Self-pay | Admitting: Internal Medicine

## 2019-08-11 DIAGNOSIS — I10 Essential (primary) hypertension: Secondary | ICD-10-CM

## 2019-08-11 DIAGNOSIS — G2581 Restless legs syndrome: Secondary | ICD-10-CM | POA: Insufficient documentation

## 2019-08-11 DIAGNOSIS — E1022 Type 1 diabetes mellitus with diabetic chronic kidney disease: Secondary | ICD-10-CM

## 2019-08-11 DIAGNOSIS — N185 Chronic kidney disease, stage 5: Secondary | ICD-10-CM | POA: Diagnosis not present

## 2019-08-11 MED ORDER — ROPINIROLE HCL 1 MG PO TABS: 1.0000 mg | ORAL_TABLET | Freq: Every day | ORAL | 3 refills | Status: DC

## 2019-08-11 NOTE — Assessment & Plan Note (Signed)
Continue f/u endo as planned

## 2019-08-11 NOTE — Assessment & Plan Note (Signed)
stable overall by history and exam, recent data reviewed with pt, and pt to continue medical treatment as before,  to f/u any worsening symptoms or concerns  

## 2019-08-11 NOTE — Progress Notes (Signed)
Subjective:    Patient ID: Hector Mahoney, male    DOB: 09/03/1970, 48 y.o.   MRN: [id number]  HPI  Here with 3 wks worsening sensation to the legs with a funny feeling that occurs to go to bed and often makes it so he cant sleep, sort of creepy crawly and has to move the legs to relive it but doesn't really relived, but eventualy after several hours seems to finally get getter. Pt denies new neurological symptoms such as new headache, or facial or extremity weakness or numbness   Pt denies polydipsia, polyuria, Pt denies chest pain, increased sob or doe, wheezing, orthopnea, PND, increased LE swelling, palpitations, dizziness or syncope. Past Medical History:  Diagnosis Date  . ABSCESS, TOOTH 04/23/2009  . AKI (acute kidney injury) ([place]) 08/2015  . Anemia   . CHF (congestive heart failure) ([place])   . Chronic kidney disease    dialysis M-W-F  . DIABETES MELLITUS, TYPE I 07/30/2007  . Esophageal reflux 04/27/2009  . FATIGUE 04/27/2009   Resolved per patient 06/03/19, no longer a problem  . HYPERLIPIDEMIA 04/24/2007  . HYPERTENSION 04/24/2007  . INSOMNIA-SLEEP DISORDER-UNSPEC 04/27/2009   resolved, no longer a problem per patient 06/03/19  . LUMBAR STRAIN, ACUTE 12/11/2008  . RASH-NONVESICULAR 03/11/2008   Past Surgical History:  Procedure Laterality Date  . AV FISTULA PLACEMENT Left 06/04/2019   Procedure: BRACHIAL-CEPHALIC ARTERIOVENOUS (AV) FISTULA CREATION LEFT ARM;  Surgeon: Rosetta Posner, MD;  Location: [facility] OR;  Service: Vascular;  Laterality: Left;  . COLONOSCOPY     polyp  . IR FLUORO GUIDE CV LINE RIGHT  10/22/2017  . IR US GUIDE VASC ACCESS RIGHT  10/22/2017  . MOUTH SURGERY     tooth ext    reports that he quit smoking about 4 years ago. His smoking use included cigarettes. He has never used smokeless tobacco. He reports current alcohol use. He reports that he does not use drugs. family history includes Colon cancer in his maternal uncle; Colon polyps in his maternal uncle;  Diabetes in his maternal aunt and mother; Heart disease in his maternal uncle; Hypertension in his mother; Lung cancer in his maternal grandfather. No Known Allergies Current Outpatient Medications on File Prior to Visit  Medication Sig Dispense Refill  . amLODipine (NORVASC) 5 MG tablet Take 1 tablet (5 mg total) by mouth daily. (Patient taking differently: Take 5 mg by mouth at bedtime. ) 30 tablet 1  . aspirin EC 81 MG EC tablet Take 1 tablet (81 mg total) by mouth daily. 30 tablet 0  . calcitRIOL (ROCALTROL) 0.25 MCG capsule Take 1 capsule (0.25 mcg total) by mouth every Monday, Wednesday, and Friday at 6 PM. 15 capsule 0  . calcium acetate (PHOSLO) 667 MG capsule Take 2 capsules (1,334 mg total) by mouth 3 (three) times daily with meals. (Patient taking differently: Take 1,334-2,001 mg by mouth See admin instructions. Take 2001 mg with each meal and 1334 mg with each snack) 180 capsule 3  . cinacalcet (SENSIPAR) 30 MG tablet Take 30 mg by mouth daily. Taking half tab on Monday, Wednesday, Friday.    . dicyclomine (BENTYL) 20 MG tablet Take 1 tablet (20 mg total) by mouth 3 (three) times daily before meals. 90 tablet 0  . insulin aspart (NOVOLOG) 100 UNIT/ML injection Inject 8-12 Units into the skin 3 (three) times daily with meals. 10 mL 11  . insulin glargine (LANTUS) 100 UNIT/ML injection Inject 0.25 mLs (25 Units total) into the skin  at bedtime. 20 mL 5  . isosorbide mononitrate (IMDUR) 30 MG 24 hr tablet TAKE ONE-HALF TABLET BY MOUTH ONCE DAILY (Patient taking differently: Take 15 mg by mouth at bedtime. ) 90 tablet 1  . meclizine (ANTIVERT) 12.5 MG tablet Take 1 tablet (12.5 mg total) by mouth 3 (three) times daily as needed for dizziness. 40 tablet 2  . omeprazole (PRILOSEC) 40 MG capsule Take 1 capsule (40 mg total) by mouth daily. (Patient taking differently: Take 40 mg by mouth 2 (two) times daily. ) 60 capsule 3  . ondansetron (ZOFRAN-ODT) 8 MG disintegrating tablet Take 8 mg by mouth 3  (three) times daily as needed for nausea/vomiting.    . rosuvastatin (CRESTOR) 10 MG tablet Take 1 tablet (10 mg total) by mouth daily. 30 tablet 3  . Vitamin D, Ergocalciferol, (DRISDOL) 1.25 MG (50000 UT) CAPS capsule Take 1 capsule (50,000 Units total) by mouth every 7 (seven) days. 12 capsule 0   No current facility-administered medications on file prior to visit.   Review of Systems  Constitutional: Negative for other unusual diaphoresis or sweats HENT: Negative for ear discharge or swelling Eyes: Negative for other worsening visual disturbances Respiratory: Negative for stridor or other swelling  Gastrointestinal: Negative for worsening distension or other blood Genitourinary: Negative for retention or other urinary change Musculoskeletal: Negative for other MSK pain or swelling Skin: Negative for color change or other new lesions Neurological: Negative for worsening tremors and other numbness  Psychiatric/Behavioral: Negative for worsening agitation or other fatigue All otherwise neg per pt     Objective:   Physical Exam BP (!) 142/90   Pulse 97   Temp 98.5 F (36.9 C)   Ht 5\' 6"  (1.676 m)   Wt 201 lb (91.2 kg)   SpO2 100%   BMI 32.44 kg/m  VS noted,  Constitutional: Pt appears in NAD HENT: Head: NCAT.  Right Ear: External ear normal.  Left Ear: External ear normal.  Eyes: . Pupils are equal, round, and reactive to light. Conjunctivae and EOM are normal Nose: without d/c or deformity Neck: Neck supple. Gross normal ROM Cardiovascular: Normal rate and regular rhythm.   Pulmonary/Chest: Effort normal and breath sounds without rales or wheezing.  Abd:  Soft, NT, ND, + BS, no organomegaly Neurological: Pt is alert. At baseline orientation, motor grossly intact Skin: Skin is warm. No rashes, other new lesions, no LE edema Psychiatric: Pt behavior is normal without agitation  All otherwise neg per pt Lab Results  Component Value Date   WBC 8.2 06/25/2019   HGB 12.3  (L) 06/25/2019   HCT 37.5 (L) 06/25/2019   PLT 261.0 06/25/2019   GLUCOSE 173 (H) 06/25/2019   CHOL 183 06/25/2019   TRIG 214.0 (H) 06/25/2019   HDL 58.40 06/25/2019   LDLDIRECT 80.0 06/25/2019   ALT 15 06/25/2019   AST 18 06/25/2019   NA 141 06/25/2019   K 4.3 06/25/2019   CL 97 06/25/2019   CREATININE 8.83 (HH) 06/25/2019   BUN 51 (H) 06/25/2019   CO2 29 06/25/2019   TSH 2.03 06/25/2019   PSA 1.39 06/25/2019   INR 1.05 10/21/2017   HGBA1C 9.3 (H) 06/25/2019          Assessment & Plan:

## 2019-08-11 NOTE — Assessment & Plan Note (Signed)
Uncontrolled, for increaed requip 1 mg qhs,  to f/u any worsening symptoms or concerns

## 2019-08-11 NOTE — Patient Instructions (Addendum)
Ok to increase the requip to 1 mg at bedtime  Please continue all other medications as before, and refills have been done if requested.  Please have the pharmacy call with any other refills you may need.  Please continue your efforts at being more active, low cholesterol diet, and weight control.  Please keep your appointments with your specialists as you may have planned  Please return in 3 months, or sooner if needed

## 2019-08-12 DIAGNOSIS — N2581 Secondary hyperparathyroidism of renal origin: Secondary | ICD-10-CM | POA: Diagnosis not present

## 2019-08-12 DIAGNOSIS — Z992 Dependence on renal dialysis: Secondary | ICD-10-CM | POA: Diagnosis not present

## 2019-08-12 DIAGNOSIS — T8249XA Other complication of vascular dialysis catheter, initial encounter: Secondary | ICD-10-CM | POA: Diagnosis not present

## 2019-08-12 DIAGNOSIS — N186 End stage renal disease: Secondary | ICD-10-CM | POA: Diagnosis not present

## 2019-08-13 DIAGNOSIS — N186 End stage renal disease: Secondary | ICD-10-CM | POA: Diagnosis not present

## 2019-08-13 DIAGNOSIS — Z992 Dependence on renal dialysis: Secondary | ICD-10-CM | POA: Diagnosis not present

## 2019-08-13 DIAGNOSIS — E1022 Type 1 diabetes mellitus with diabetic chronic kidney disease: Secondary | ICD-10-CM | POA: Diagnosis not present

## 2019-08-15 DIAGNOSIS — N2581 Secondary hyperparathyroidism of renal origin: Secondary | ICD-10-CM | POA: Diagnosis not present

## 2019-08-15 DIAGNOSIS — T8249XA Other complication of vascular dialysis catheter, initial encounter: Secondary | ICD-10-CM | POA: Diagnosis not present

## 2019-08-15 DIAGNOSIS — N186 End stage renal disease: Secondary | ICD-10-CM | POA: Diagnosis not present

## 2019-08-15 DIAGNOSIS — Z992 Dependence on renal dialysis: Secondary | ICD-10-CM | POA: Diagnosis not present

## 2019-08-17 DIAGNOSIS — T8249XA Other complication of vascular dialysis catheter, initial encounter: Secondary | ICD-10-CM | POA: Diagnosis not present

## 2019-08-17 DIAGNOSIS — N186 End stage renal disease: Secondary | ICD-10-CM | POA: Diagnosis not present

## 2019-08-17 DIAGNOSIS — Z992 Dependence on renal dialysis: Secondary | ICD-10-CM | POA: Diagnosis not present

## 2019-08-17 DIAGNOSIS — N2581 Secondary hyperparathyroidism of renal origin: Secondary | ICD-10-CM | POA: Diagnosis not present

## 2019-08-19 DIAGNOSIS — N2581 Secondary hyperparathyroidism of renal origin: Secondary | ICD-10-CM | POA: Diagnosis not present

## 2019-08-19 DIAGNOSIS — N186 End stage renal disease: Secondary | ICD-10-CM | POA: Diagnosis not present

## 2019-08-19 DIAGNOSIS — T8249XA Other complication of vascular dialysis catheter, initial encounter: Secondary | ICD-10-CM | POA: Diagnosis not present

## 2019-08-19 DIAGNOSIS — Z992 Dependence on renal dialysis: Secondary | ICD-10-CM | POA: Diagnosis not present

## 2019-08-20 DIAGNOSIS — Z717 Human immunodeficiency virus [HIV] counseling: Secondary | ICD-10-CM | POA: Diagnosis not present

## 2019-08-20 DIAGNOSIS — N186 End stage renal disease: Secondary | ICD-10-CM | POA: Diagnosis not present

## 2019-08-20 DIAGNOSIS — E1122 Type 2 diabetes mellitus with diabetic chronic kidney disease: Secondary | ICD-10-CM | POA: Diagnosis not present

## 2019-08-20 DIAGNOSIS — Z114 Encounter for screening for human immunodeficiency virus [HIV]: Secondary | ICD-10-CM | POA: Diagnosis not present

## 2019-08-20 DIAGNOSIS — I120 Hypertensive chronic kidney disease with stage 5 chronic kidney disease or end stage renal disease: Secondary | ICD-10-CM | POA: Diagnosis not present

## 2019-08-20 DIAGNOSIS — Z79899 Other long term (current) drug therapy: Secondary | ICD-10-CM | POA: Diagnosis not present

## 2019-08-21 DIAGNOSIS — T8249XA Other complication of vascular dialysis catheter, initial encounter: Secondary | ICD-10-CM | POA: Diagnosis not present

## 2019-08-21 DIAGNOSIS — N186 End stage renal disease: Secondary | ICD-10-CM | POA: Diagnosis not present

## 2019-08-21 DIAGNOSIS — N2581 Secondary hyperparathyroidism of renal origin: Secondary | ICD-10-CM | POA: Diagnosis not present

## 2019-08-21 DIAGNOSIS — Z992 Dependence on renal dialysis: Secondary | ICD-10-CM | POA: Diagnosis not present

## 2019-08-24 DIAGNOSIS — T8249XA Other complication of vascular dialysis catheter, initial encounter: Secondary | ICD-10-CM | POA: Diagnosis not present

## 2019-08-24 DIAGNOSIS — Z992 Dependence on renal dialysis: Secondary | ICD-10-CM | POA: Diagnosis not present

## 2019-08-24 DIAGNOSIS — N2581 Secondary hyperparathyroidism of renal origin: Secondary | ICD-10-CM | POA: Diagnosis not present

## 2019-08-24 DIAGNOSIS — N186 End stage renal disease: Secondary | ICD-10-CM | POA: Diagnosis not present

## 2019-08-26 DIAGNOSIS — Z992 Dependence on renal dialysis: Secondary | ICD-10-CM | POA: Diagnosis not present

## 2019-08-26 DIAGNOSIS — N186 End stage renal disease: Secondary | ICD-10-CM | POA: Diagnosis not present

## 2019-08-26 DIAGNOSIS — N2581 Secondary hyperparathyroidism of renal origin: Secondary | ICD-10-CM | POA: Diagnosis not present

## 2019-08-26 DIAGNOSIS — T8249XA Other complication of vascular dialysis catheter, initial encounter: Secondary | ICD-10-CM | POA: Diagnosis not present

## 2019-08-28 DIAGNOSIS — Z992 Dependence on renal dialysis: Secondary | ICD-10-CM | POA: Diagnosis not present

## 2019-08-28 DIAGNOSIS — T8249XA Other complication of vascular dialysis catheter, initial encounter: Secondary | ICD-10-CM | POA: Diagnosis not present

## 2019-08-28 DIAGNOSIS — N186 End stage renal disease: Secondary | ICD-10-CM | POA: Diagnosis not present

## 2019-08-28 DIAGNOSIS — N2581 Secondary hyperparathyroidism of renal origin: Secondary | ICD-10-CM | POA: Diagnosis not present

## 2019-08-31 DIAGNOSIS — N2581 Secondary hyperparathyroidism of renal origin: Secondary | ICD-10-CM | POA: Diagnosis not present

## 2019-08-31 DIAGNOSIS — Z992 Dependence on renal dialysis: Secondary | ICD-10-CM | POA: Diagnosis not present

## 2019-08-31 DIAGNOSIS — T8249XA Other complication of vascular dialysis catheter, initial encounter: Secondary | ICD-10-CM | POA: Diagnosis not present

## 2019-08-31 DIAGNOSIS — N186 End stage renal disease: Secondary | ICD-10-CM | POA: Diagnosis not present

## 2019-09-02 DIAGNOSIS — Z992 Dependence on renal dialysis: Secondary | ICD-10-CM | POA: Diagnosis not present

## 2019-09-02 DIAGNOSIS — N2581 Secondary hyperparathyroidism of renal origin: Secondary | ICD-10-CM | POA: Diagnosis not present

## 2019-09-02 DIAGNOSIS — T8249XA Other complication of vascular dialysis catheter, initial encounter: Secondary | ICD-10-CM | POA: Diagnosis not present

## 2019-09-02 DIAGNOSIS — N186 End stage renal disease: Secondary | ICD-10-CM | POA: Diagnosis not present

## 2019-09-03 DIAGNOSIS — E1122 Type 2 diabetes mellitus with diabetic chronic kidney disease: Secondary | ICD-10-CM | POA: Diagnosis not present

## 2019-09-03 DIAGNOSIS — N186 End stage renal disease: Secondary | ICD-10-CM | POA: Diagnosis not present

## 2019-09-03 DIAGNOSIS — I120 Hypertensive chronic kidney disease with stage 5 chronic kidney disease or end stage renal disease: Secondary | ICD-10-CM | POA: Diagnosis not present

## 2019-09-04 DIAGNOSIS — N2581 Secondary hyperparathyroidism of renal origin: Secondary | ICD-10-CM | POA: Diagnosis not present

## 2019-09-04 DIAGNOSIS — N186 End stage renal disease: Secondary | ICD-10-CM | POA: Diagnosis not present

## 2019-09-04 DIAGNOSIS — Z992 Dependence on renal dialysis: Secondary | ICD-10-CM | POA: Diagnosis not present

## 2019-09-04 DIAGNOSIS — T8249XA Other complication of vascular dialysis catheter, initial encounter: Secondary | ICD-10-CM | POA: Diagnosis not present

## 2019-09-07 DIAGNOSIS — N2581 Secondary hyperparathyroidism of renal origin: Secondary | ICD-10-CM | POA: Diagnosis not present

## 2019-09-07 DIAGNOSIS — Z992 Dependence on renal dialysis: Secondary | ICD-10-CM | POA: Diagnosis not present

## 2019-09-07 DIAGNOSIS — N186 End stage renal disease: Secondary | ICD-10-CM | POA: Diagnosis not present

## 2019-09-07 DIAGNOSIS — T8249XA Other complication of vascular dialysis catheter, initial encounter: Secondary | ICD-10-CM | POA: Diagnosis not present

## 2019-09-08 DIAGNOSIS — Z23 Encounter for immunization: Secondary | ICD-10-CM | POA: Diagnosis not present

## 2019-09-09 DIAGNOSIS — Z992 Dependence on renal dialysis: Secondary | ICD-10-CM | POA: Diagnosis not present

## 2019-09-09 DIAGNOSIS — N186 End stage renal disease: Secondary | ICD-10-CM | POA: Diagnosis not present

## 2019-09-09 DIAGNOSIS — N2581 Secondary hyperparathyroidism of renal origin: Secondary | ICD-10-CM | POA: Diagnosis not present

## 2019-09-09 DIAGNOSIS — T8249XA Other complication of vascular dialysis catheter, initial encounter: Secondary | ICD-10-CM | POA: Diagnosis not present

## 2019-09-11 DIAGNOSIS — T8249XA Other complication of vascular dialysis catheter, initial encounter: Secondary | ICD-10-CM | POA: Diagnosis not present

## 2019-09-11 DIAGNOSIS — Z992 Dependence on renal dialysis: Secondary | ICD-10-CM | POA: Diagnosis not present

## 2019-09-11 DIAGNOSIS — N2581 Secondary hyperparathyroidism of renal origin: Secondary | ICD-10-CM | POA: Diagnosis not present

## 2019-09-11 DIAGNOSIS — N186 End stage renal disease: Secondary | ICD-10-CM | POA: Diagnosis not present

## 2019-09-13 DIAGNOSIS — Z992 Dependence on renal dialysis: Secondary | ICD-10-CM | POA: Diagnosis not present

## 2019-09-13 DIAGNOSIS — N186 End stage renal disease: Secondary | ICD-10-CM | POA: Diagnosis not present

## 2019-09-13 DIAGNOSIS — E1022 Type 1 diabetes mellitus with diabetic chronic kidney disease: Secondary | ICD-10-CM | POA: Diagnosis not present

## 2019-09-14 ENCOUNTER — Telehealth: Payer: Self-pay | Admitting: Gastroenterology

## 2019-09-14 DIAGNOSIS — N2581 Secondary hyperparathyroidism of renal origin: Secondary | ICD-10-CM | POA: Diagnosis not present

## 2019-09-14 DIAGNOSIS — N186 End stage renal disease: Secondary | ICD-10-CM | POA: Diagnosis not present

## 2019-09-14 DIAGNOSIS — T8249XA Other complication of vascular dialysis catheter, initial encounter: Secondary | ICD-10-CM | POA: Diagnosis not present

## 2019-09-14 DIAGNOSIS — Z992 Dependence on renal dialysis: Secondary | ICD-10-CM | POA: Diagnosis not present

## 2019-09-14 MED ORDER — DICYCLOMINE HCL 20 MG PO TABS: 20.0000 mg | ORAL_TABLET | Freq: Three times a day (TID) | ORAL | 0 refills | Status: DC

## 2019-09-14 NOTE — Telephone Encounter (Signed)
Prescription sent to patient's pharmacy.

## 2019-09-14 NOTE — Telephone Encounter (Signed)
Paient is requesting refill on Dicyclomine 20mg  sent to [facility] on file.

## 2019-09-15 ENCOUNTER — Encounter (HOSPITAL_COMMUNITY): Payer: Federal, State, Local not specified - PPO

## 2019-09-15 ENCOUNTER — Encounter: Payer: Self-pay | Admitting: Surgery

## 2019-09-15 ENCOUNTER — Ambulatory Visit: Payer: Federal, State, Local not specified - PPO

## 2019-09-15 NOTE — Progress Notes (Deleted)
HISTORY AND PHYSICAL     CC:  dialysis access Requesting Provider:  Biagio Borg, MD  HPI: This is a 49 y.o. male here for evaluation of his hemodialysis access.   He underwent a left BC AVF on 06/04/2019 by Dr. Donnetta Hutching.  He was seen back on 07/14/2019 and at that time, his fistula had an excellent thrill, however, there were a couple of areas that had not yet matured.  It was discussed with Dr. Donnetta Hutching and decided to have pt come back in a couple of months with repeat u/s and be evaluated.   He has a TDC that was placed in March 2019.   He returns today for that visit.  ***     Pt is on dialysis.  If pt on Dialysis:   Dialysis days/center:  M/W/F at [facility] location  The pt is on a statin for cholesterol management.  The pt is on a daily aspirin.  Other AC:  none The pt is on CCB for hypertension.  The pt is diabetic.   Tobacco hx:  Remote-quit 2016  Past Medical History:  Diagnosis Date  . ABSCESS, TOOTH 04/23/2009  . AKI (acute kidney injury) ([place]) 08/2015  . Anemia   . CHF (congestive heart failure) ([place])   . Chronic kidney disease    dialysis M-W-F  . DIABETES MELLITUS, TYPE I 07/30/2007  . Esophageal reflux 04/27/2009  . FATIGUE 04/27/2009   Resolved per patient 06/03/19, no longer a problem  . HYPERLIPIDEMIA 04/24/2007  . HYPERTENSION 04/24/2007  . INSOMNIA-SLEEP DISORDER-UNSPEC 04/27/2009   resolved, no longer a problem per patient 06/03/19  . LUMBAR STRAIN, ACUTE 12/11/2008  . RASH-NONVESICULAR 03/11/2008    Past Surgical History:  Procedure Laterality Date  . AV FISTULA PLACEMENT Left 06/04/2019   Procedure: BRACHIAL-CEPHALIC ARTERIOVENOUS (AV) FISTULA CREATION LEFT ARM;  Surgeon: Rosetta Posner, MD;  Location: [facility] OR;  Service: Vascular;  Laterality: Left;  . COLONOSCOPY     polyp  . IR FLUORO GUIDE CV LINE RIGHT  10/22/2017  . IR US GUIDE VASC ACCESS RIGHT  10/22/2017  . MOUTH SURGERY     tooth ext    No Known Allergies  Current Outpatient Medications   Medication Sig Dispense Refill  . amLODipine (NORVASC) 5 MG tablet Take 1 tablet (5 mg total) by mouth daily. (Patient taking differently: Take 5 mg by mouth at bedtime. ) 30 tablet 1  . aspirin EC 81 MG EC tablet Take 1 tablet (81 mg total) by mouth daily. 30 tablet 0  . calcitRIOL (ROCALTROL) 0.25 MCG capsule Take 1 capsule (0.25 mcg total) by mouth every Monday, Wednesday, and Friday at 6 PM. 15 capsule 0  . calcium acetate (PHOSLO) 667 MG capsule Take 2 capsules (1,334 mg total) by mouth 3 (three) times daily with meals. (Patient taking differently: Take 1,334-2,001 mg by mouth See admin instructions. Take 2001 mg with each meal and 1334 mg with each snack) 180 capsule 3  . cinacalcet (SENSIPAR) 30 MG tablet Take 30 mg by mouth daily. Taking half tab on Monday, Wednesday, Friday.    . dicyclomine (BENTYL) 20 MG tablet Take 1 tablet (20 mg total) by mouth 3 (three) times daily before meals. 90 tablet 0  . insulin aspart (NOVOLOG) 100 UNIT/ML injection Inject 8-12 Units into the skin 3 (three) times daily with meals. 10 mL 11  . insulin glargine (LANTUS) 100 UNIT/ML injection Inject 0.25 mLs (25 Units total) into the skin at bedtime. 20 mL 5  .  isosorbide mononitrate (IMDUR) 30 MG 24 hr tablet TAKE ONE-HALF TABLET BY MOUTH ONCE DAILY (Patient taking differently: Take 15 mg by mouth at bedtime. ) 90 tablet 1  . meclizine (ANTIVERT) 12.5 MG tablet Take 1 tablet (12.5 mg total) by mouth 3 (three) times daily as needed for dizziness. 40 tablet 2  . omeprazole (PRILOSEC) 40 MG capsule Take 1 capsule (40 mg total) by mouth daily. (Patient taking differently: Take 40 mg by mouth 2 (two) times daily. ) 60 capsule 3  . ondansetron (ZOFRAN-ODT) 8 MG disintegrating tablet Take 8 mg by mouth 3 (three) times daily as needed for nausea/vomiting.    Marland Kitchen rOPINIRole (REQUIP) 1 MG tablet Take 1 tablet (1 mg total) by mouth at bedtime. 90 tablet 3  . rosuvastatin (CRESTOR) 10 MG tablet Take 1 tablet (10 mg total) by  mouth daily. 30 tablet 3  . Vitamin D, Ergocalciferol, (DRISDOL) 1.25 MG (50000 UT) CAPS capsule Take 1 capsule (50,000 Units total) by mouth every 7 (seven) days. 12 capsule 0   No current facility-administered medications for this visit.    Family History  Problem Relation Age of Onset  . Hypertension Mother   . Diabetes Mother   . Diabetes Maternal Aunt   . Lung cancer Maternal Grandfather   . Colon polyps Maternal Uncle   . Colon cancer Maternal Uncle   . Heart disease Maternal Uncle   . Kidney disease Neg Hx   . Esophageal cancer Neg Hx   . Rectal cancer Neg Hx   . Stomach cancer Neg Hx     Social History   Socioeconomic History  . Marital status: Married    Spouse name: Not on file  . Number of children: 0  . Years of education: Not on file  . Highest education level: Not on file  Occupational History  . Not on file  Tobacco Use  . Smoking status: Former Smoker    Types: Cigarettes    Quit date: 05/03/2015    Years since quitting: 4.3  . Smokeless tobacco: Never Used  . Tobacco comment: 12/22/2012 "used to smoke cigarettes once or twice/month; haven't had any cigarettes for years"  Substance and Sexual Activity  . Alcohol use: Yes    Comment: occasional   . Drug use: No  . Sexual activity: Yes  Other Topics Concern  . Not on file  Social History Narrative  . Not on file   Social Determinants of Health   Financial Resource Strain:   . Difficulty of Paying Living Expenses: Not on file  Food Insecurity:   . Worried About Charity fundraiser in the Last Year: Not on file  . Ran Out of Food in the Last Year: Not on file  Transportation Needs:   . Lack of Transportation (Medical): Not on file  . Lack of Transportation (Non-Medical): Not on file  Physical Activity:   . Days of Exercise per Week: Not on file  . Minutes of Exercise per Session: Not on file  Stress:   . Feeling of Stress : Not on file  Social Connections:   . Frequency of Communication with  Friends and Family: Not on file  . Frequency of Social Gatherings with Friends and Family: Not on file  . Attends Religious Services: Not on file  . Active Member of Clubs or Organizations: Not on file  . Attends Archivist Meetings: Not on file  . Marital Status: Not on file  Intimate Partner Violence:   .  Fear of Current or Ex-Partner: Not on file  . Emotionally Abused: Not on file  . Physically Abused: Not on file  . Sexually Abused: Not on file     ROS: [x]  Positive   [ ]  Negative   [ ]  All sytems reviewed and are negative *** Cardiac: []  chest pain/pressure []  SOB []  DOE  Vascular: []  pain in legs while walking []  pain in feet when lying flat []  hx of DVT []  swelling in legs  Pulmonary: []  asthma []  wheezing  Neurologic: []  weakness in []  arms []  legs []  numbness in []  arms []  legs [] difficulty speaking or slurred speech []  temporary loss of vision in one eye []  dizziness  Hematologic: []  bleeding problems  GI []  GERD  GU: [x]  CKD/renal failure  []  HD---[]  M/W/F []  T/T/S []  burning with urination []  blood in urine  Psychiatric: []  hx of major depression  Integumentary: []  rashes []  ulcers  Constitutional: []  fever []  chills  PHYSICAL EXAMINATION:  ***   General:  WDWN male in NAD Gait: Not observed HENT: WNL Pulmonary: normal non-labored breathing , without Rales, rhonchi,  wheezing Cardiac: {Desc; regular/irreg:14544}, without  Murmurs {With/Without:20273} carotid bruit*** Abdomen: soft, NT, no masses Skin: {With/Without:20273} rashes, {With/Without:20273} ulcers  Vascular Exam/Pulses:   Right Left  Radial {Exam; arterial pulse strength 0-4:30167} {Exam; arterial pulse strength 0-4:30167}  Ulnar {Exam; arterial pulse strength 0-4:30167} {Exam; arterial pulse strength 0-4:30167}  Brachial {Exam; arterial pulse strength 0-4:30167} {Exam; arterial pulse strength 0-4:30167}  Femoral {Exam; arterial pulse strength 0-4:30167} {Exam;  arterial pulse strength 0-4:30167}  Popliteal {Exam; arterial pulse strength 0-4:30167} {Exam; arterial pulse strength 0-4:30167}  DP {Exam; arterial pulse strength 0-4:30167} {Exam; arterial pulse strength 0-4:30167}  PT {Exam; arterial pulse strength 0-4:30167} {Exam; arterial pulse strength 0-4:30167}  Peroneal {Exam; arterial pulse strength 0-4:30167} {Exam; arterial pulse strength 0-4:30167}   Extremities:  {With/Without:20273} ischemic changes, {With/Without:20273} Gangrene, {With/Without:20273} cellulitis; {With/Without:20273} open wounds;  Musculoskeletal: no muscle wasting or atrophy  Neurologic: A&O X 3; Moving all extremities equally;  Speech is fluent/normal  Non-Invasive Vascular Imaging:   Upper Extremity Vein Mapping on 09/15/2019: Diameter:  *** Depth:  ***  Previous dialysis duplex 07/14/2019: +------------+----------+-------------+----------+----------------+  OUTFLOW VEINPSV (cm/s)Diameter (cm)Depth (cm)  Describe    +------------+----------+-------------+----------+----------------+  Shoulder    158    0.39     1.49            +------------+----------+-------------+----------+----------------+  Prox UA     158    0.53     0.74            +------------+----------+-------------+----------+----------------+  Mid UA     146    0.39     0.49            +------------+----------+-------------+----------+----------------+  Dist UA     471    0.58     0.45  competing branch  +------------+----------+-------------+----------+----------------+  AC Fossa    517    0.28     0.86            +------------+----------+-------------+----------+----------------+   ASSESSMENT/PLAN: 49 y.o. male with hx of left BC AVF on 06/04/2019 by Dr. Donnetta Hutching. here for evaluation for hemodialysis access, which is f/u appt from a couple of months ago to check maturation of  fistula.     - will plan for *** -pt *** on anticoagulation   Leontine Locket, PA-C [facility] [phone]  Clinic MD:   Early

## 2019-09-16 DIAGNOSIS — N186 End stage renal disease: Secondary | ICD-10-CM | POA: Diagnosis not present

## 2019-09-16 DIAGNOSIS — Z992 Dependence on renal dialysis: Secondary | ICD-10-CM | POA: Diagnosis not present

## 2019-09-16 DIAGNOSIS — T8249XA Other complication of vascular dialysis catheter, initial encounter: Secondary | ICD-10-CM | POA: Diagnosis not present

## 2019-09-16 DIAGNOSIS — N2581 Secondary hyperparathyroidism of renal origin: Secondary | ICD-10-CM | POA: Diagnosis not present

## 2019-09-18 DIAGNOSIS — T8249XA Other complication of vascular dialysis catheter, initial encounter: Secondary | ICD-10-CM | POA: Diagnosis not present

## 2019-09-18 DIAGNOSIS — N186 End stage renal disease: Secondary | ICD-10-CM | POA: Diagnosis not present

## 2019-09-18 DIAGNOSIS — N2581 Secondary hyperparathyroidism of renal origin: Secondary | ICD-10-CM | POA: Diagnosis not present

## 2019-09-18 DIAGNOSIS — Z992 Dependence on renal dialysis: Secondary | ICD-10-CM | POA: Diagnosis not present

## 2019-09-21 DIAGNOSIS — Z992 Dependence on renal dialysis: Secondary | ICD-10-CM | POA: Diagnosis not present

## 2019-09-21 DIAGNOSIS — N186 End stage renal disease: Secondary | ICD-10-CM | POA: Diagnosis not present

## 2019-09-21 DIAGNOSIS — N2581 Secondary hyperparathyroidism of renal origin: Secondary | ICD-10-CM | POA: Diagnosis not present

## 2019-09-21 DIAGNOSIS — T8249XA Other complication of vascular dialysis catheter, initial encounter: Secondary | ICD-10-CM | POA: Diagnosis not present

## 2019-09-23 DIAGNOSIS — N2581 Secondary hyperparathyroidism of renal origin: Secondary | ICD-10-CM | POA: Diagnosis not present

## 2019-09-23 DIAGNOSIS — T8249XA Other complication of vascular dialysis catheter, initial encounter: Secondary | ICD-10-CM | POA: Diagnosis not present

## 2019-09-23 DIAGNOSIS — Z992 Dependence on renal dialysis: Secondary | ICD-10-CM | POA: Diagnosis not present

## 2019-09-23 DIAGNOSIS — N186 End stage renal disease: Secondary | ICD-10-CM | POA: Diagnosis not present

## 2019-09-24 DIAGNOSIS — E103513 Type 1 diabetes mellitus with proliferative diabetic retinopathy with macular edema, bilateral: Secondary | ICD-10-CM | POA: Diagnosis not present

## 2019-09-24 DIAGNOSIS — H3582 Retinal ischemia: Secondary | ICD-10-CM | POA: Diagnosis not present

## 2019-09-24 DIAGNOSIS — H35033 Hypertensive retinopathy, bilateral: Secondary | ICD-10-CM | POA: Diagnosis not present

## 2019-09-25 DIAGNOSIS — T8249XA Other complication of vascular dialysis catheter, initial encounter: Secondary | ICD-10-CM | POA: Diagnosis not present

## 2019-09-25 DIAGNOSIS — N186 End stage renal disease: Secondary | ICD-10-CM | POA: Diagnosis not present

## 2019-09-25 DIAGNOSIS — N2581 Secondary hyperparathyroidism of renal origin: Secondary | ICD-10-CM | POA: Diagnosis not present

## 2019-09-25 DIAGNOSIS — Z992 Dependence on renal dialysis: Secondary | ICD-10-CM | POA: Diagnosis not present

## 2019-09-28 DIAGNOSIS — N2581 Secondary hyperparathyroidism of renal origin: Secondary | ICD-10-CM | POA: Diagnosis not present

## 2019-09-28 DIAGNOSIS — T8249XA Other complication of vascular dialysis catheter, initial encounter: Secondary | ICD-10-CM | POA: Diagnosis not present

## 2019-09-28 DIAGNOSIS — Z992 Dependence on renal dialysis: Secondary | ICD-10-CM | POA: Diagnosis not present

## 2019-09-28 DIAGNOSIS — N186 End stage renal disease: Secondary | ICD-10-CM | POA: Diagnosis not present

## 2019-09-30 DIAGNOSIS — Z23 Encounter for immunization: Secondary | ICD-10-CM | POA: Diagnosis not present

## 2019-10-01 DIAGNOSIS — N2581 Secondary hyperparathyroidism of renal origin: Secondary | ICD-10-CM | POA: Diagnosis not present

## 2019-10-01 DIAGNOSIS — T8249XA Other complication of vascular dialysis catheter, initial encounter: Secondary | ICD-10-CM | POA: Diagnosis not present

## 2019-10-01 DIAGNOSIS — Z992 Dependence on renal dialysis: Secondary | ICD-10-CM | POA: Diagnosis not present

## 2019-10-01 DIAGNOSIS — N186 End stage renal disease: Secondary | ICD-10-CM | POA: Diagnosis not present

## 2019-10-02 DIAGNOSIS — Z992 Dependence on renal dialysis: Secondary | ICD-10-CM | POA: Diagnosis not present

## 2019-10-02 DIAGNOSIS — T8249XA Other complication of vascular dialysis catheter, initial encounter: Secondary | ICD-10-CM | POA: Diagnosis not present

## 2019-10-02 DIAGNOSIS — N186 End stage renal disease: Secondary | ICD-10-CM | POA: Diagnosis not present

## 2019-10-02 DIAGNOSIS — N2581 Secondary hyperparathyroidism of renal origin: Secondary | ICD-10-CM | POA: Diagnosis not present

## 2019-10-05 DIAGNOSIS — T8249XA Other complication of vascular dialysis catheter, initial encounter: Secondary | ICD-10-CM | POA: Diagnosis not present

## 2019-10-05 DIAGNOSIS — N2581 Secondary hyperparathyroidism of renal origin: Secondary | ICD-10-CM | POA: Diagnosis not present

## 2019-10-05 DIAGNOSIS — Z992 Dependence on renal dialysis: Secondary | ICD-10-CM | POA: Diagnosis not present

## 2019-10-05 DIAGNOSIS — N186 End stage renal disease: Secondary | ICD-10-CM | POA: Diagnosis not present

## 2019-10-06 IMAGING — CR DG CHEST 2V
2 series · 2 of 2 positions shown · non-contrast
Comparison: 09/02/2015

CLINICAL DATA: Short of breath

EXAM:
CHEST  2 VIEW

[w chest pa]
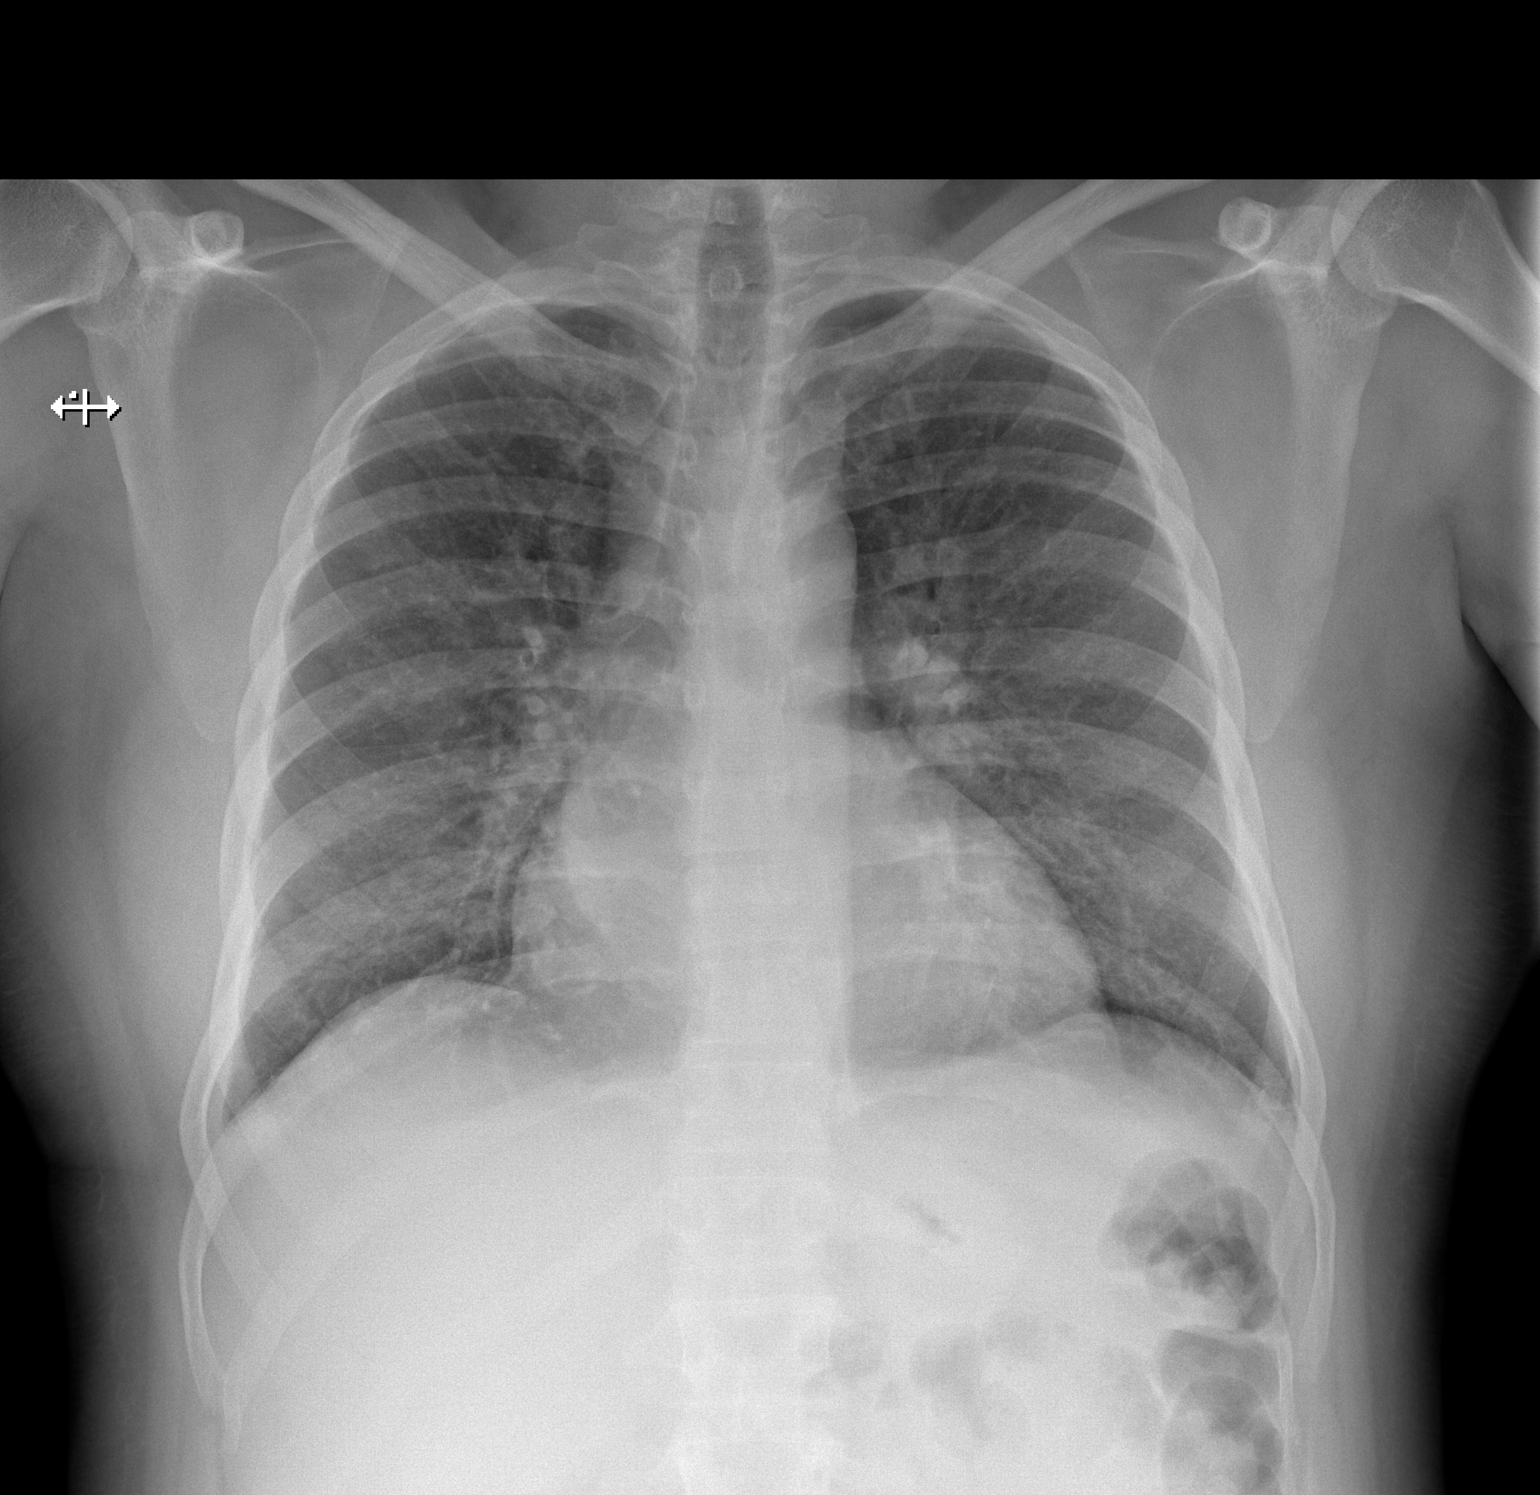

[w chest lat]
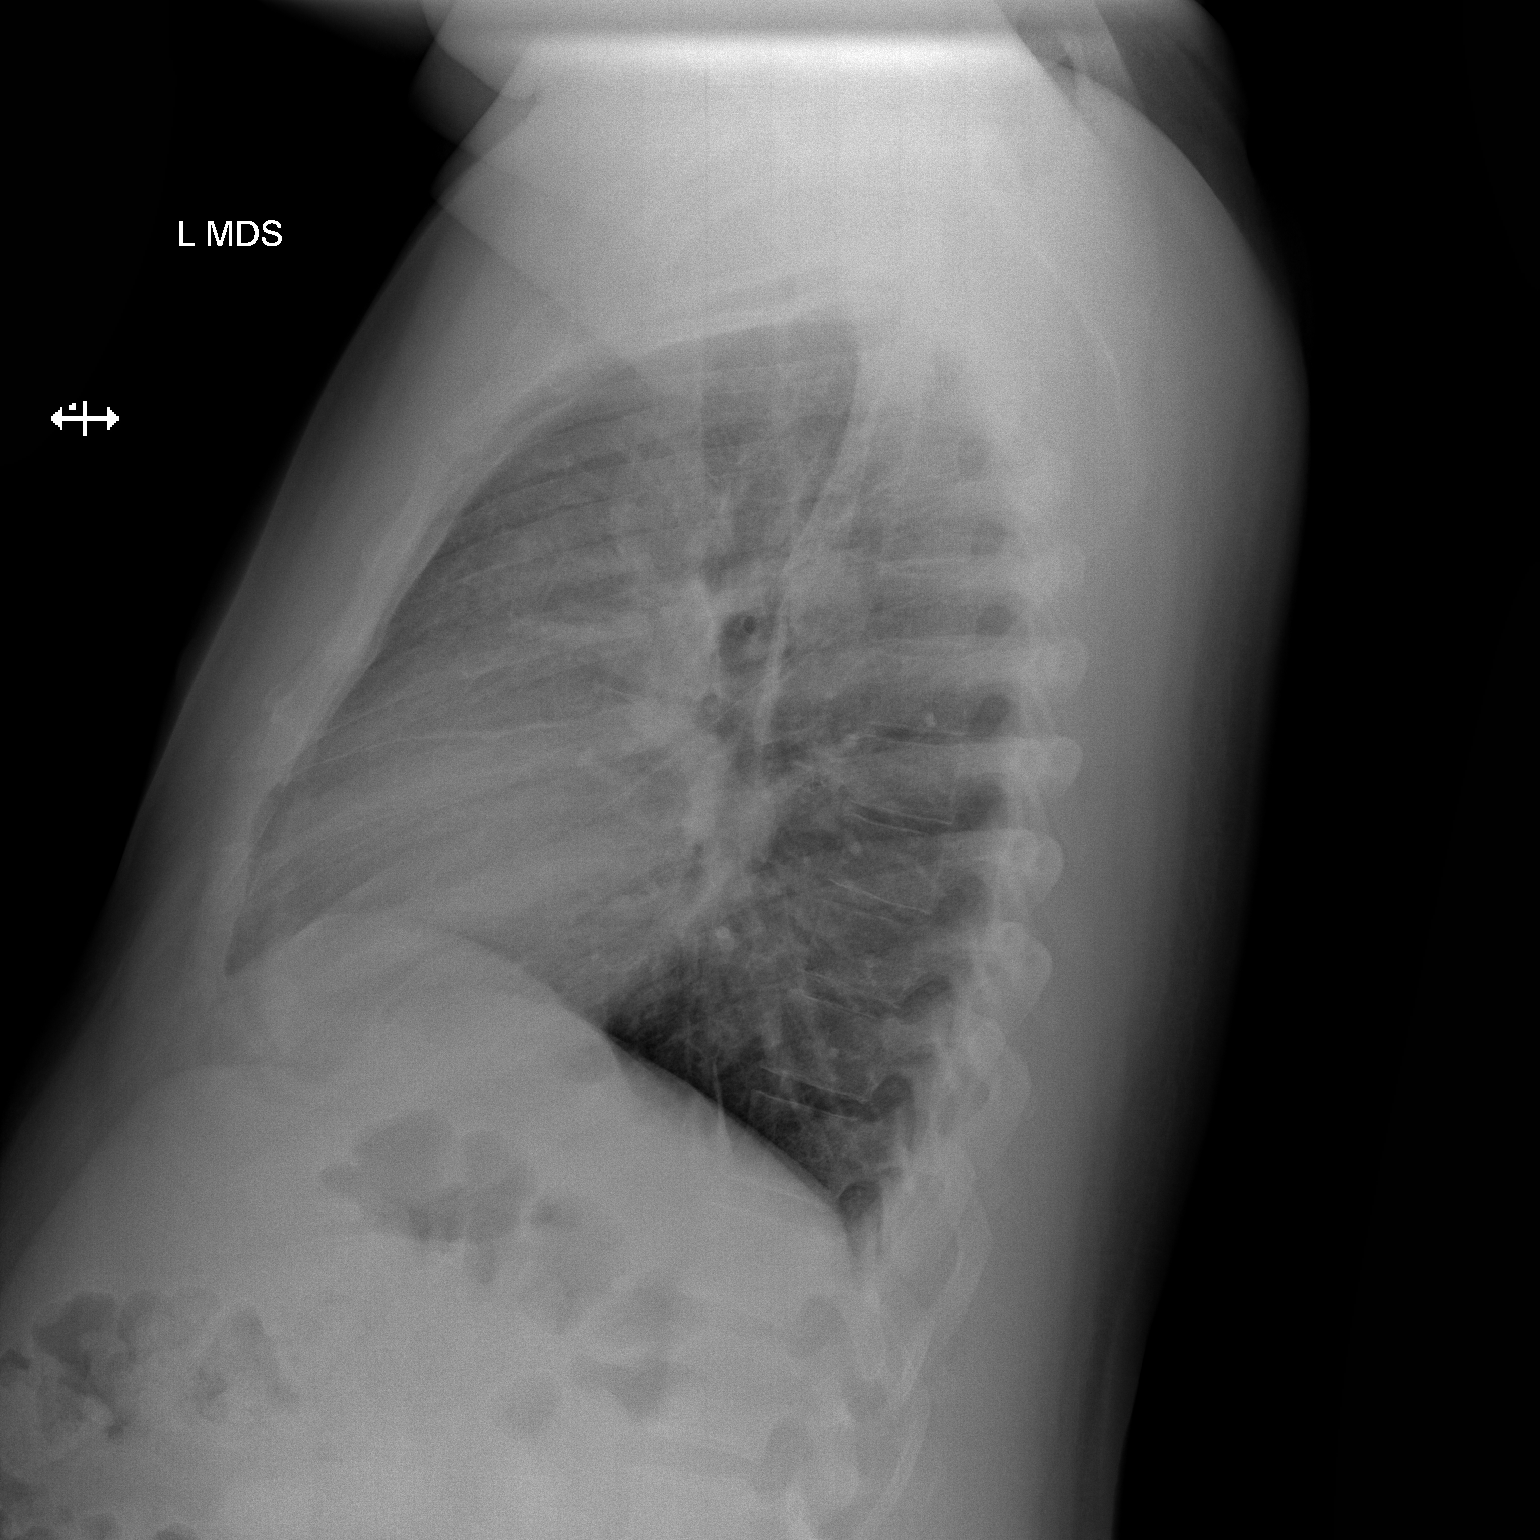

[2 of 2 positions shown; findings below may reference images not displayed]

FINDINGS: The heart size and mediastinal contours are within normal limits.
Both lungs are clear. The visualized skeletal structures are
unremarkable.
IMPRESSION: No active cardiopulmonary disease.

## 2019-10-07 DIAGNOSIS — T8249XA Other complication of vascular dialysis catheter, initial encounter: Secondary | ICD-10-CM | POA: Diagnosis not present

## 2019-10-07 DIAGNOSIS — Z992 Dependence on renal dialysis: Secondary | ICD-10-CM | POA: Diagnosis not present

## 2019-10-07 DIAGNOSIS — N186 End stage renal disease: Secondary | ICD-10-CM | POA: Diagnosis not present

## 2019-10-07 DIAGNOSIS — N2581 Secondary hyperparathyroidism of renal origin: Secondary | ICD-10-CM | POA: Diagnosis not present

## 2019-10-08 DIAGNOSIS — Z452 Encounter for adjustment and management of vascular access device: Secondary | ICD-10-CM | POA: Diagnosis not present

## 2019-10-09 DIAGNOSIS — T8249XA Other complication of vascular dialysis catheter, initial encounter: Secondary | ICD-10-CM | POA: Diagnosis not present

## 2019-10-09 DIAGNOSIS — N2581 Secondary hyperparathyroidism of renal origin: Secondary | ICD-10-CM | POA: Diagnosis not present

## 2019-10-09 DIAGNOSIS — Z992 Dependence on renal dialysis: Secondary | ICD-10-CM | POA: Diagnosis not present

## 2019-10-09 DIAGNOSIS — N186 End stage renal disease: Secondary | ICD-10-CM | POA: Diagnosis not present

## 2019-10-11 DIAGNOSIS — E1022 Type 1 diabetes mellitus with diabetic chronic kidney disease: Secondary | ICD-10-CM | POA: Diagnosis not present

## 2019-10-11 DIAGNOSIS — Z992 Dependence on renal dialysis: Secondary | ICD-10-CM | POA: Diagnosis not present

## 2019-10-11 DIAGNOSIS — N186 End stage renal disease: Secondary | ICD-10-CM | POA: Diagnosis not present

## 2019-10-12 DIAGNOSIS — Z7682 Awaiting organ transplant status: Secondary | ICD-10-CM | POA: Diagnosis not present

## 2019-10-16 DIAGNOSIS — N2581 Secondary hyperparathyroidism of renal origin: Secondary | ICD-10-CM | POA: Diagnosis not present

## 2019-10-16 DIAGNOSIS — Z992 Dependence on renal dialysis: Secondary | ICD-10-CM | POA: Diagnosis not present

## 2019-10-16 DIAGNOSIS — E1121 Type 2 diabetes mellitus with diabetic nephropathy: Secondary | ICD-10-CM | POA: Diagnosis not present

## 2019-10-16 DIAGNOSIS — N186 End stage renal disease: Secondary | ICD-10-CM | POA: Diagnosis not present

## 2019-10-16 DIAGNOSIS — T8249XA Other complication of vascular dialysis catheter, initial encounter: Secondary | ICD-10-CM | POA: Diagnosis not present

## 2019-10-19 ENCOUNTER — Other Ambulatory Visit: Payer: Self-pay | Admitting: Gastroenterology

## 2019-10-19 DIAGNOSIS — N186 End stage renal disease: Secondary | ICD-10-CM | POA: Diagnosis not present

## 2019-10-19 DIAGNOSIS — Z992 Dependence on renal dialysis: Secondary | ICD-10-CM | POA: Diagnosis not present

## 2019-10-19 DIAGNOSIS — N2581 Secondary hyperparathyroidism of renal origin: Secondary | ICD-10-CM | POA: Diagnosis not present

## 2019-10-19 MED ORDER — DICYCLOMINE HCL 20 MG PO TABS: 20.0000 mg | ORAL_TABLET | Freq: Three times a day (TID) | ORAL | 0 refills | Status: DC

## 2019-10-19 NOTE — Telephone Encounter (Signed)
Sent in dicyclomine for patient she has a follow up appointment in April

## 2019-10-21 DIAGNOSIS — Z992 Dependence on renal dialysis: Secondary | ICD-10-CM | POA: Diagnosis not present

## 2019-10-21 DIAGNOSIS — N186 End stage renal disease: Secondary | ICD-10-CM | POA: Diagnosis not present

## 2019-10-21 DIAGNOSIS — N2581 Secondary hyperparathyroidism of renal origin: Secondary | ICD-10-CM | POA: Diagnosis not present

## 2019-10-23 DIAGNOSIS — Z992 Dependence on renal dialysis: Secondary | ICD-10-CM | POA: Diagnosis not present

## 2019-10-23 DIAGNOSIS — N2581 Secondary hyperparathyroidism of renal origin: Secondary | ICD-10-CM | POA: Diagnosis not present

## 2019-10-23 DIAGNOSIS — N186 End stage renal disease: Secondary | ICD-10-CM | POA: Diagnosis not present

## 2019-10-26 DIAGNOSIS — Z992 Dependence on renal dialysis: Secondary | ICD-10-CM | POA: Diagnosis not present

## 2019-10-26 DIAGNOSIS — N2581 Secondary hyperparathyroidism of renal origin: Secondary | ICD-10-CM | POA: Diagnosis not present

## 2019-10-26 DIAGNOSIS — N186 End stage renal disease: Secondary | ICD-10-CM | POA: Diagnosis not present

## 2019-10-28 ENCOUNTER — Telehealth: Payer: Federal, State, Local not specified - PPO | Admitting: Cardiology

## 2019-10-28 DIAGNOSIS — N2581 Secondary hyperparathyroidism of renal origin: Secondary | ICD-10-CM | POA: Diagnosis not present

## 2019-10-28 DIAGNOSIS — Z992 Dependence on renal dialysis: Secondary | ICD-10-CM | POA: Diagnosis not present

## 2019-10-28 DIAGNOSIS — N186 End stage renal disease: Secondary | ICD-10-CM | POA: Diagnosis not present

## 2019-10-29 IMAGING — CT CT CERVICAL SPINE W/O CM
2 of 14 series · 5 of 33 positions shown, 6 images · non-contrast
Comparison: Head CT dated 09/01/2005

CLINICAL DATA: Dizzy, fell onto concrete. Injury to face. Abrasion
to forehead. Pain and swelling to nose and upper lip. Loss of
consciousness.

EXAM:
CT HEAD WITHOUT CONTRAST
CT MAXILLOFACIAL WITHOUT CONTRAST
CT CERVICAL SPINE WITHOUT CONTRAST
TECHNIQUE: Multidetector CT imaging of the head, cervical spine, and
maxillofacial structures were performed using the standard protocol
without intravenous contrast. Multiplanar CT image reconstructions
of the cervical spine and maxillofacial structures were also
generated.

[Series 8: maxilllofacial 2.0 hr40 3 · axial · 0.37mm/px · z∈[+1156,+1342]mm · 3 of 94 slices shown, 4 images]
[im 1/94  soft-tissue]
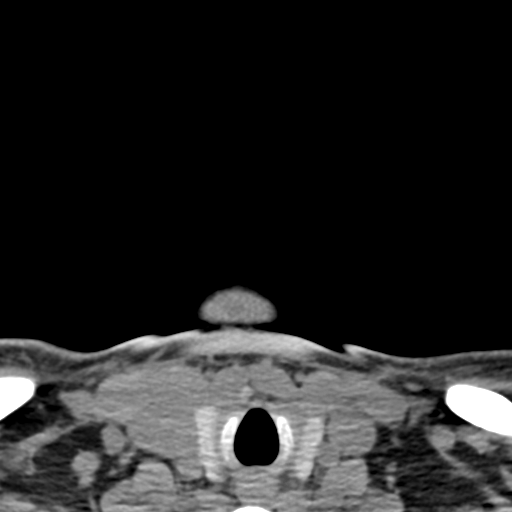
[im 1/94  bone]
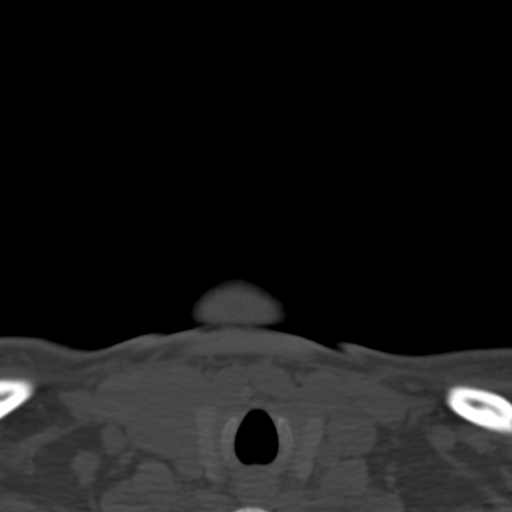
[im 47/94  bone]
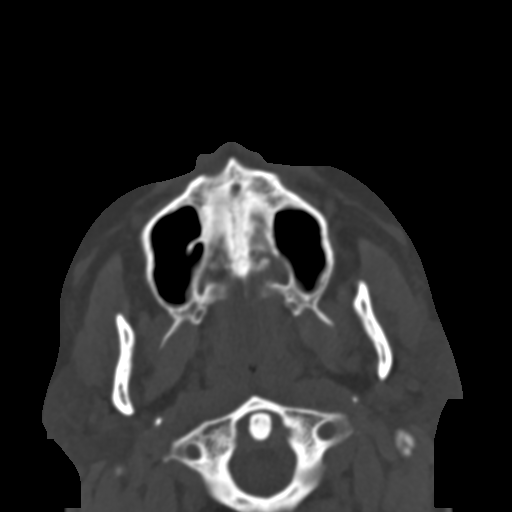
[im 94/94  bone]
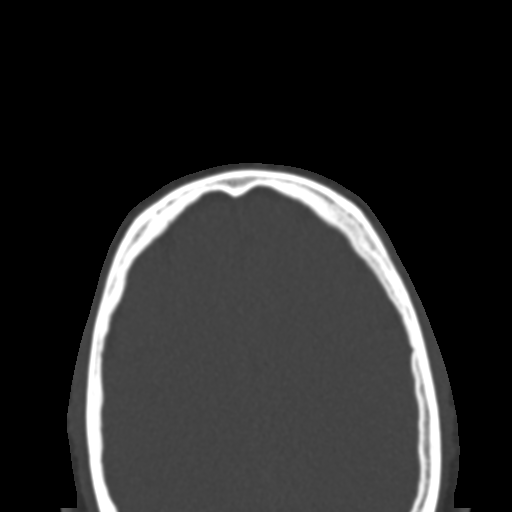

[Series 13: st sag · sagittal · 0.41mm/px · 2 of 76 slices shown]
[im 26/76  bone]
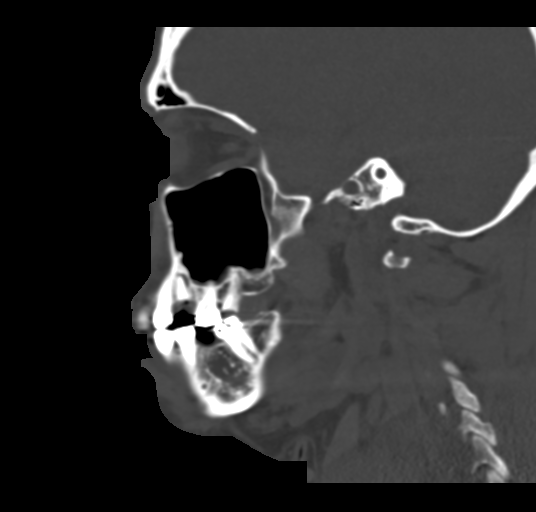
[im 51/76  bone]
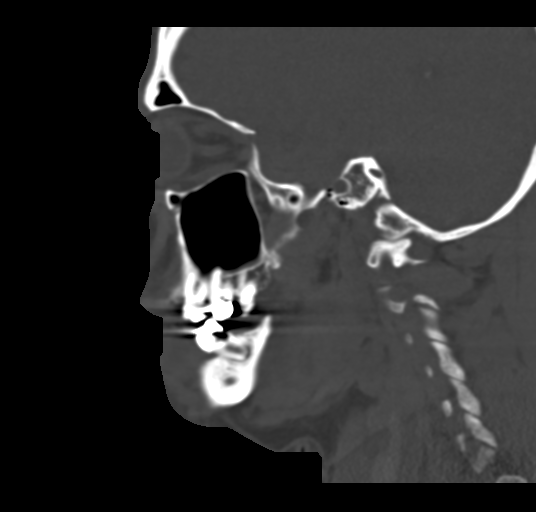

[5 of 33 positions shown; findings below may reference images not displayed]

FINDINGS: CT HEAD FINDINGS

Brain: Ventricles are normal in size and configuration. All areas of
the brain demonstrate normal gray-white matter differentiation.
There is no mass, hemorrhage, edema or other evidence of acute
parenchymal abnormality. No extra-axial hemorrhage.

Vascular: There are chronic calcified atherosclerotic changes of the
large vessels at the skull base. No unexpected hyperdense vessel.

Skull: Normal. Negative for fracture or focal lesion.

Other: None.

CT MAXILLOFACIAL FINDINGS

Osseous: Lower frontal bones are intact and normally aligned. No
displaced nasal bone fracture. Osseous structures about the orbits
are intact and normally aligned bilaterally. Walls of the maxillary
sinuses are intact and normally aligned bilaterally. Bilateral
zygoma and pterygoid plates are intact. No mandible fracture or
displacement seen.

Incidental note made of slight anterior subluxation of the left
mandibular condyle relative to the condylar fossa, likely related to
chronic degenerative change at the TMJ.

Orbits: Orbital globes appear intact and symmetric in position. No
retro-orbital hemorrhage or edema.

Sinuses: Clear

Soft tissues: Soft tissue edema overlying the nasal bones and upper
lip. No circumscribed soft tissue hematoma appreciated.

CT CERVICAL SPINE FINDINGS

Alignment: Straightening of the normal cervical spine lordosis,
likely related to patient positioning or muscle spasm. No evidence
of acute vertebral body subluxation.

Skull base and vertebrae: No fracture line or displaced fracture
fragment. Facet joints appear intact and normally aligned
throughout.

Soft tissues and spinal canal: No prevertebral fluid or swelling. No
visible canal hematoma.

Disc levels: Mild disc desiccations at the C5-6 and C6-7 levels,
with associated mild disc space narrowings and mild osseous
spurring. No more than mild central canal stenosis at any level. No
large disc bulge or protrusion seen.

Upper chest: Negative.

Other: None
IMPRESSION: 1. Negative head CT. No intracranial mass, hemorrhage or edema. No
skull fracture.
2. No facial bone fracture or dislocation. Soft tissue edema
overlying the nasal bones and upper lip.
3. No fracture or acute subluxation within the cervical spine. Mild
degenerative change within the lower cervical spine.

## 2019-10-30 DIAGNOSIS — Z992 Dependence on renal dialysis: Secondary | ICD-10-CM | POA: Diagnosis not present

## 2019-10-30 DIAGNOSIS — N186 End stage renal disease: Secondary | ICD-10-CM | POA: Diagnosis not present

## 2019-10-30 DIAGNOSIS — N2581 Secondary hyperparathyroidism of renal origin: Secondary | ICD-10-CM | POA: Diagnosis not present

## 2019-11-01 IMAGING — DX DG ABD PORTABLE 2V
1 series · 4 of 4 positions shown · non-contrast
Comparison: None.

CLINICAL DATA: Vomiting

EXAM:
PORTABLE ABDOMEN - 2 VIEW

[Series 1: abdomen · 0.14mm/px · 4 of 4 slices shown]
[im 1/4]
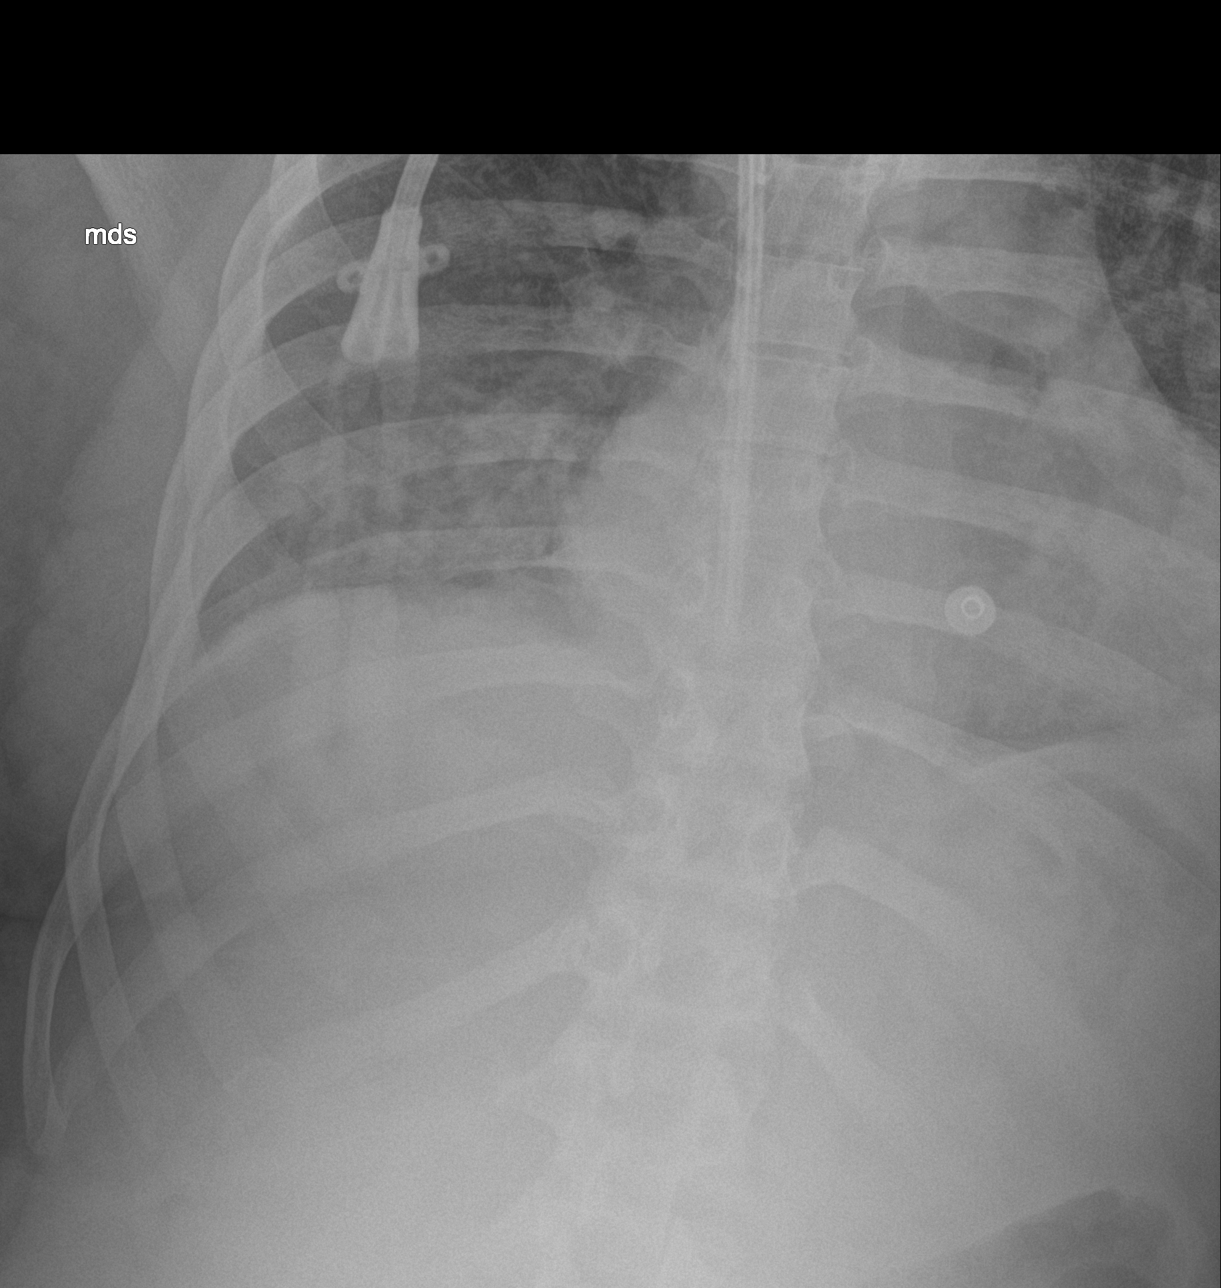
[im 2/4]
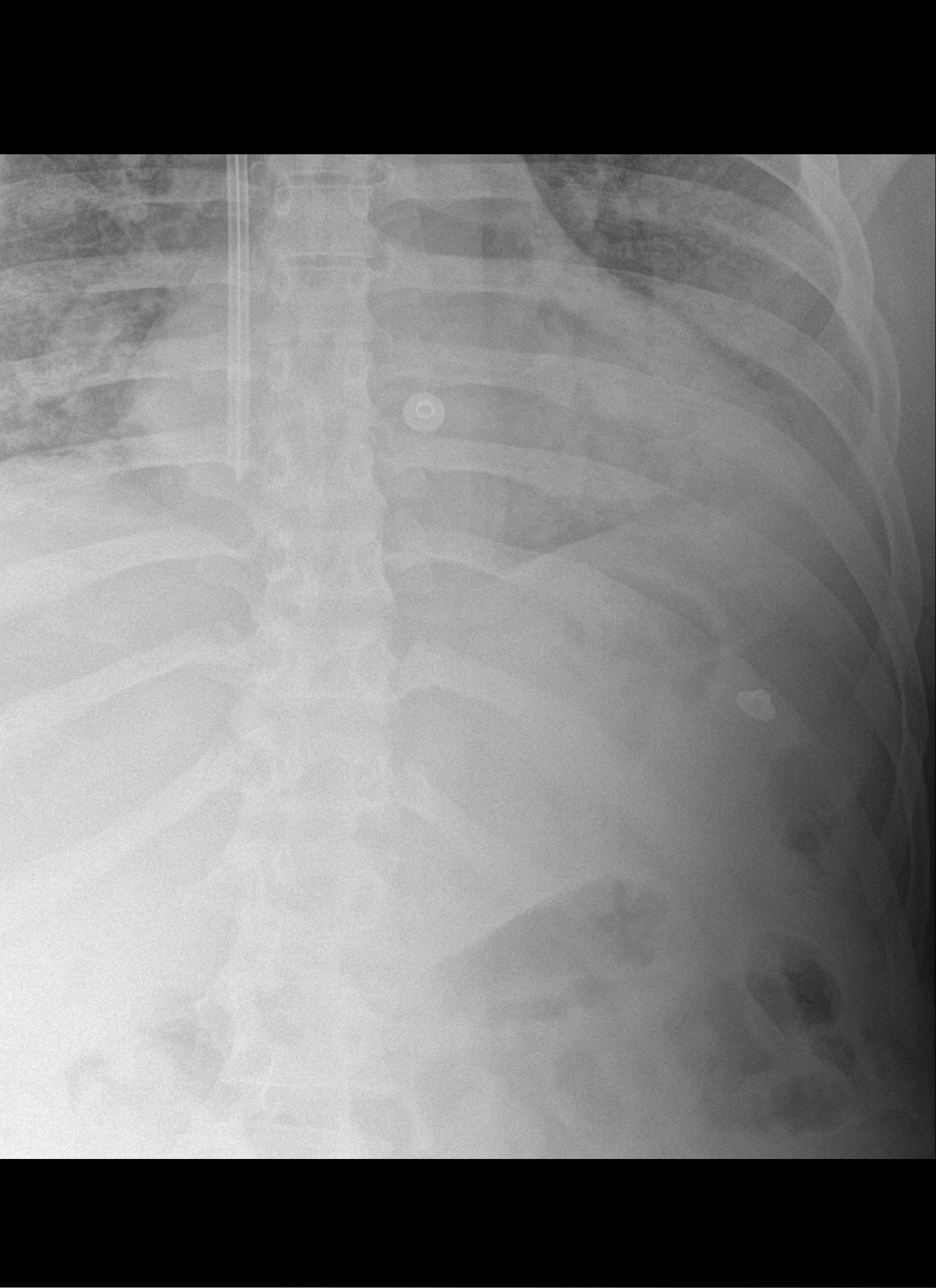
[im 3/4]
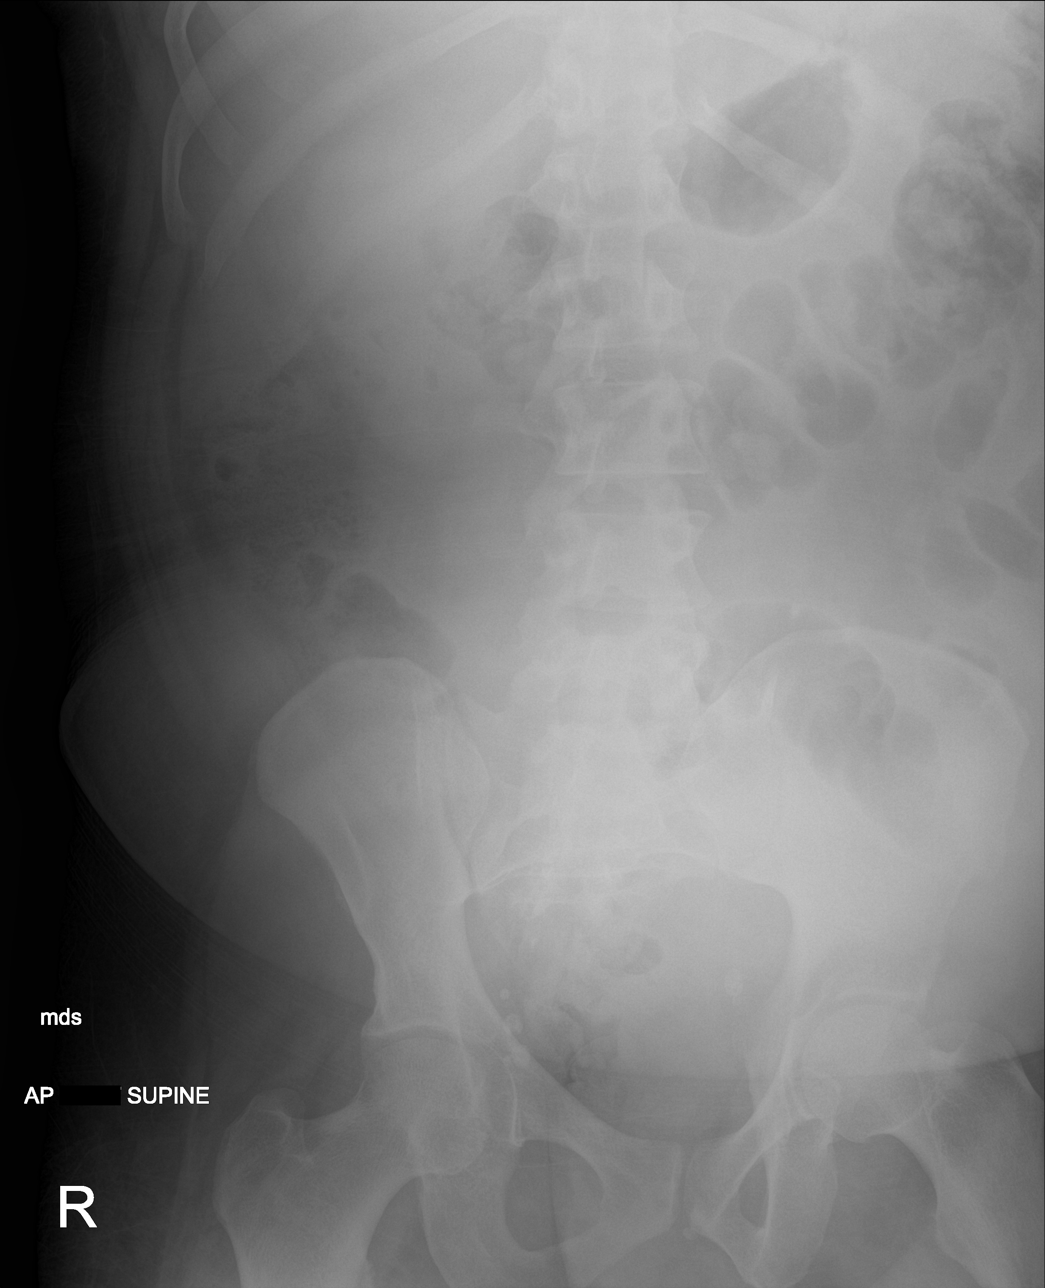
[im 4/4]
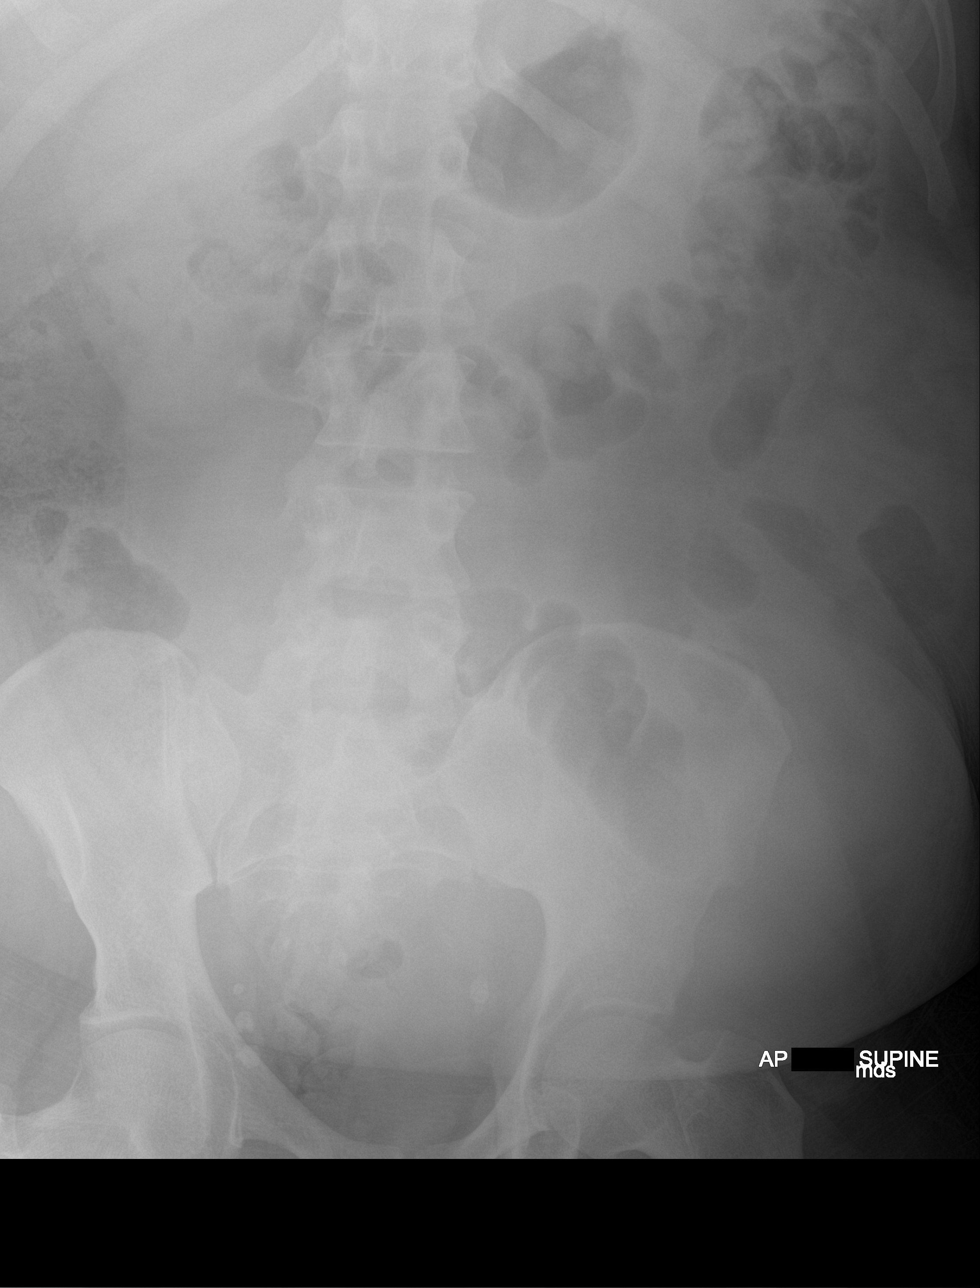

[4 of 4 positions shown; findings below may reference images not displayed]

FINDINGS: No dilated small bowel loops or significant air-fluid levels.
Moderate stool throughout the large bowel. No evidence of
pneumatosis or pneumoperitoneum. No radiopaque nephrolithiasis. Hazy
opacities at the lung bases with possible small bilateral pleural
effusions. Right-sided MediPort terminates in the right atrium.
IMPRESSION: Nonobstructive bowel gas pattern. Moderate colonic stool volume,
which may indicate constipation.

Hazy opacities at the lung bases with possible small left pleural
effusion, correlate with chest radiographs as clinically warranted.

## 2019-11-01 NOTE — Progress Notes (Signed)
Virtual Visit via Telephone Note   This visit type was conducted due to national recommendations for restrictions regarding the COVID-19 Pandemic (e.g. social distancing) in an effort to limit this patient's exposure and mitigate transmission in our community.  Due to his co-morbid illnesses, this patient is at least at moderate risk for complications without adequate follow up.  This format is felt to be most appropriate for this patient at this time.  The patient did not have access to video technology/had technical difficulties with video requiring transitioning to audio format only (telephone).  All issues noted in this document were discussed and addressed.  No physical exam could be performed with this format.  Please refer to the patient's chart for his  consent to telehealth for [facility].   The patient was identified using 2 identifiers.  Date:  11/02/2019   ID:  Hector Mahoney, DOB [birth year], MRN [id number]  Patient Location: Home Provider Location: Home  PCP:  Biagio Borg, MD  Cardiologist:  Skeet Latch, MD  Electrophysiologist:  None   Evaluation Performed:  Follow-Up Visit  Chief Complaint:  Yearly visit  History of Present Illness:    Hector Mahoney is a 49 y.o. male with chronic diastolic heart failure, hypertension, hyperlipidemia, IDDM, ESRD on HD.  He has grade 3 diastolic dysfunction by echo in 2014 with multiple hospitalizations for heart failure exacerbations.  His dry weight is thought to be 207 pounds.  He was last seen in clinic on 10/16/2017.  He was doing well at that time.  He has since had an AV fistula placed with a VVS 05/2019.  He is maintained on aspirin, Crestor 10 mg, Imdur, amlodipine.  He presents today for follow-up. He is now on HD. He is being worked up for pancreas-kidney transplant. [facility] [place]. He does not have a BP cuff, but pressures have been controlled at HD - 148/53 prior to HD. He denies chest  pain, SOB, orthopnea, and lower extremity swelling. No syncope. Nephrology D/C'ed imdur.   He had a stress test at [facility] in 2018 and in [place] 3 weeks ago in preparation for transplant.   He goes to the gym on non-HD days and does 30 min on the treadmill.    The patient does not have symptoms concerning for COVID-19 infection (fever, chills, cough, or new shortness of breath).    Past Medical History:  Diagnosis Date  . ABSCESS, TOOTH 04/23/2009  . AKI (acute kidney injury) ([place]) 08/2015  . Anemia   . CHF (congestive heart failure) ([place])   . Chronic kidney disease    dialysis M-W-F  . DIABETES MELLITUS, TYPE I 07/30/2007  . Esophageal reflux 04/27/2009  . FATIGUE 04/27/2009   Resolved per patient 06/03/19, no longer a problem  . HYPERLIPIDEMIA 04/24/2007  . HYPERTENSION 04/24/2007  . INSOMNIA-SLEEP DISORDER-UNSPEC 04/27/2009   resolved, no longer a problem per patient 06/03/19  . LUMBAR STRAIN, ACUTE 12/11/2008  . RASH-NONVESICULAR 03/11/2008   Past Surgical History:  Procedure Laterality Date  . AV FISTULA PLACEMENT Left 06/04/2019   Procedure: BRACHIAL-CEPHALIC ARTERIOVENOUS (AV) FISTULA CREATION LEFT ARM;  Surgeon: Rosetta Posner, MD;  Location: [facility] OR;  Service: Vascular;  Laterality: Left;  . COLONOSCOPY     polyp  . IR FLUORO GUIDE CV LINE RIGHT  10/22/2017  . IR US GUIDE VASC ACCESS RIGHT  10/22/2017  . MOUTH SURGERY     tooth ext     Current  Meds  Medication Sig  . amLODipine (NORVASC) 5 MG tablet Take 1 tablet (5 mg total) by mouth daily. (Patient taking differently: Take 5 mg by mouth at bedtime. )  . aspirin EC 81 MG EC tablet Take 1 tablet (81 mg total) by mouth daily.  . calcitRIOL (ROCALTROL) 0.25 MCG capsule Take 1 capsule (0.25 mcg total) by mouth every Monday, Wednesday, and Friday at 6 PM.  . calcium acetate (PHOSLO) 667 MG capsule Take 2 capsules (1,334 mg total) by mouth 3 (three) times daily with meals. (Patient taking differently: Take 2,001 mg by mouth 3 (three)  times daily with meals. )  . cinacalcet (SENSIPAR) 30 MG tablet Take 30 mg by mouth every evening.   . dicyclomine (BENTYL) 20 MG tablet Take 1 tablet (20 mg total) by mouth 3 (three) times daily before meals.  . insulin aspart (NOVOLOG) 100 UNIT/ML injection Inject 8-12 Units into the skin 3 (three) times daily with meals.  . insulin glargine (LANTUS) 100 UNIT/ML injection Inject 0.25 mLs (25 Units total) into the skin at bedtime.  . meclizine (ANTIVERT) 12.5 MG tablet Take 1 tablet (12.5 mg total) by mouth 3 (three) times daily as needed for dizziness.  Marland Kitchen omeprazole (PRILOSEC) 40 MG capsule Take 1 capsule (40 mg total) by mouth daily. (Patient taking differently: Take 40 mg by mouth 2 (two) times daily. )  . rOPINIRole (REQUIP) 1 MG tablet Take 1 tablet (1 mg total) by mouth at bedtime.  . rosuvastatin (CRESTOR) 10 MG tablet Take 1 tablet (10 mg total) by mouth daily.  . Vitamin D, Ergocalciferol, (DRISDOL) 1.25 MG (50000 UT) CAPS capsule Take 1 capsule (50,000 Units total) by mouth every 7 (seven) days.  . [DISCONTINUED] isosorbide mononitrate (IMDUR) 30 MG 24 hr tablet TAKE ONE-HALF TABLET BY MOUTH ONCE DAILY (Patient taking differently: Take 15 mg by mouth at bedtime. )  . [DISCONTINUED] ondansetron (ZOFRAN-ODT) 8 MG disintegrating tablet Take 8 mg by mouth 3 (three) times daily as needed for nausea/vomiting.     Allergies:   Patient has no known allergies.   Social History   Tobacco Use  . Smoking status: Former Smoker    Types: Cigarettes    Quit date: 05/03/2015    Years since quitting: 4.5  . Smokeless tobacco: Never Used  . Tobacco comment: 12/22/2012 "used to smoke cigarettes once or twice/month; haven't had any cigarettes for years"  Substance Use Topics  . Alcohol use: Yes    Comment: occasional   . Drug use: No     Family Hx: The patient's family history includes Colon cancer in his maternal uncle; Colon polyps in his maternal uncle; Diabetes in his maternal aunt and  mother; Heart disease in his maternal uncle; Hypertension in his mother; Lung cancer in his maternal grandfather. There is no history of Kidney disease, Esophageal cancer, Rectal cancer, or Stomach cancer.  ROS:   Please see the history of present illness.     All other systems reviewed and are negative.   Prior CV studies:   The following studies were reviewed today:  Echo 09/27/17:  - Left ventricle: The cavity size was normal. There was severe  concentric hypertrophy. Systolic function was normal. The  estimated ejection fraction was in the range of 60% to 65%. Wall  motion was normal; there were no regional wall motion  abnormalities. Doppler parameters are consistent with  pseudonormal left ventricular relaxation (grade 2 diastolic  dysfunction). The E/e&' ratio is >15, suggesting elevated LV  filling pressure.  - Mitral valve: Mildly thickened leaflets . There was mild  regurgitation.  - Left atrium: Mildly dilated.  - Inferior vena cava: The vessel was normal in size. The  respirophasic diameter changes were in the normal range (>= 50%),  consistent with normal central venous pressure.   Labs/Other Tests and Data Reviewed:    EKG:  No ECG reviewed.  Recent Labs: 06/25/2019: ALT 15; BUN 51; Creatinine, Ser 8.83; Hemoglobin 12.3; Platelets 261.0; Potassium 4.3; Sodium 141; TSH 2.03   Recent Lipid Panel Lab Results  Component Value Date/Time   CHOL 183 06/25/2019 03:51 PM   TRIG 214.0 (H) 06/25/2019 03:51 PM   HDL 58.40 06/25/2019 03:51 PM   CHOLHDL 3 06/25/2019 03:51 PM   LDLDIRECT 80.0 06/25/2019 03:51 PM    Wt Readings from Last 3 Encounters:  11/02/19 194 lb (88 kg)  08/11/19 201 lb (91.2 kg)  07/14/19 204 lb 3.2 oz (92.6 kg)     Objective:    Vital Signs:  Ht 5\' 6"  (1.676 m)   Wt 194 lb (88 kg)   BMI 31.31 kg/m    VITAL SIGNS:  reviewed GEN:  no acute distress RESPIRATORY:  respirations unlabored NEURO:  alert and oriented x 3,  no obvious focal deficit PSYCH:  normal affect  ASSESSMENT & PLAN:    Hypertension - Continue amlodipine, imdur was D/C'ed by nephrology - appreciate nephrology input    Chronic diastolic heart failure - Last echocardiogram on 09/27/2017 with EF of 60 to 65% and grade 2 diastolic dysfunction - He is now on HD   Hyperlipidemia 06/25/2019: Cholesterol 183; HDL 58.40; Triglycerides 214.0; VLDL 42.8 - on crestor 10 mg   IDDM ESRD on HD - He is being worked up for kidney and pancreas transplant - on [employer] and the [place] list   No changes today. Follow up in 1 year, sooner if needed.   COVID-19 Education: The signs and symptoms of COVID-19 were discussed with the patient and how to seek care for testing (follow up with PCP or arrange E-visit).  The importance of social distancing was discussed today.  Time:   Today, I have spent 18 minutes with the patient with telehealth technology discussing the above problems.     Medication Adjustments/Labs and Tests Ordered: Current medicines are reviewed at length with the patient today.  Concerns regarding medicines are outlined above.   Tests Ordered: No orders of the defined types were placed in this encounter.   Medication Changes: No orders of the defined types were placed in this encounter.   Follow Up:  Either In Person or Virtual in 1 year(s)  Signed, Ledora Bottcher, PA  11/02/2019 10:40 AM    [place]

## 2019-11-02 ENCOUNTER — Telehealth (INDEPENDENT_AMBULATORY_CARE_PROVIDER_SITE_OTHER): Payer: Federal, State, Local not specified - PPO | Admitting: Physician Assistant

## 2019-11-02 VITALS — Ht 66.0 in | Wt 194.0 lb

## 2019-11-02 DIAGNOSIS — Z992 Dependence on renal dialysis: Secondary | ICD-10-CM

## 2019-11-02 DIAGNOSIS — I10 Essential (primary) hypertension: Secondary | ICD-10-CM

## 2019-11-02 DIAGNOSIS — I5032 Chronic diastolic (congestive) heart failure: Secondary | ICD-10-CM | POA: Diagnosis not present

## 2019-11-02 DIAGNOSIS — E785 Hyperlipidemia, unspecified: Secondary | ICD-10-CM | POA: Diagnosis not present

## 2019-11-02 DIAGNOSIS — N186 End stage renal disease: Secondary | ICD-10-CM

## 2019-11-02 DIAGNOSIS — N2581 Secondary hyperparathyroidism of renal origin: Secondary | ICD-10-CM | POA: Diagnosis not present

## 2019-11-02 DIAGNOSIS — E1022 Type 1 diabetes mellitus with diabetic chronic kidney disease: Secondary | ICD-10-CM

## 2019-11-02 DIAGNOSIS — I110 Hypertensive heart disease with heart failure: Secondary | ICD-10-CM

## 2019-11-02 NOTE — Patient Instructions (Signed)
Medication Instructions:  No changes *If you need a refill on your cardiac medications before your next appointment, please call your pharmacy*  Lab Work: None needed this visit  Testing/Procedures: None needed this visit  Follow-Up: At [facility], you and your health needs are our priority.  As part of our continuing mission to provide you with exceptional heart care, we have created designated Provider Care Teams.  These Care Teams include your primary Cardiologist (physician) and Advanced Practice Providers (APPs -  Physician Assistants and Nurse Practitioners) who all work together to provide you with the care you need, when you need it.  We recommend signing up for the patient portal called "MyChart".  Sign up information is provided on this After Visit Summary.  MyChart is used to connect with patients for Virtual Visits (Telemedicine).  Patients are able to view lab/test results, encounter notes, upcoming appointments, etc.  Non-urgent messages can be sent to your provider as well.   To learn more about what you can do with MyChart, go to [email or web address].    Your next appointment:   1 year(s)  You will receive a reminder letter in the mail two months in advance. If you don't receive a letter, please call our office to schedule the follow-up appointment.  The format for your next appointment:   In Person  Provider:   Skeet Latch, MD

## 2019-11-04 DIAGNOSIS — N2581 Secondary hyperparathyroidism of renal origin: Secondary | ICD-10-CM | POA: Diagnosis not present

## 2019-11-04 DIAGNOSIS — Z992 Dependence on renal dialysis: Secondary | ICD-10-CM | POA: Diagnosis not present

## 2019-11-04 DIAGNOSIS — N186 End stage renal disease: Secondary | ICD-10-CM | POA: Diagnosis not present

## 2019-11-06 DIAGNOSIS — Z992 Dependence on renal dialysis: Secondary | ICD-10-CM | POA: Diagnosis not present

## 2019-11-06 DIAGNOSIS — N2581 Secondary hyperparathyroidism of renal origin: Secondary | ICD-10-CM | POA: Diagnosis not present

## 2019-11-06 DIAGNOSIS — N186 End stage renal disease: Secondary | ICD-10-CM | POA: Diagnosis not present

## 2019-11-09 DIAGNOSIS — N2581 Secondary hyperparathyroidism of renal origin: Secondary | ICD-10-CM | POA: Diagnosis not present

## 2019-11-09 DIAGNOSIS — N186 End stage renal disease: Secondary | ICD-10-CM | POA: Diagnosis not present

## 2019-11-09 DIAGNOSIS — Z992 Dependence on renal dialysis: Secondary | ICD-10-CM | POA: Diagnosis not present

## 2019-11-10 ENCOUNTER — Ambulatory Visit: Payer: Federal, State, Local not specified - PPO | Admitting: Internal Medicine

## 2019-11-10 DIAGNOSIS — Z0289 Encounter for other administrative examinations: Secondary | ICD-10-CM

## 2019-11-11 DIAGNOSIS — N2581 Secondary hyperparathyroidism of renal origin: Secondary | ICD-10-CM | POA: Diagnosis not present

## 2019-11-11 DIAGNOSIS — E1022 Type 1 diabetes mellitus with diabetic chronic kidney disease: Secondary | ICD-10-CM | POA: Diagnosis not present

## 2019-11-11 DIAGNOSIS — N186 End stage renal disease: Secondary | ICD-10-CM | POA: Diagnosis not present

## 2019-11-11 DIAGNOSIS — Z992 Dependence on renal dialysis: Secondary | ICD-10-CM | POA: Diagnosis not present

## 2019-11-13 DIAGNOSIS — Z23 Encounter for immunization: Secondary | ICD-10-CM | POA: Diagnosis not present

## 2019-11-13 DIAGNOSIS — Z992 Dependence on renal dialysis: Secondary | ICD-10-CM | POA: Diagnosis not present

## 2019-11-13 DIAGNOSIS — N186 End stage renal disease: Secondary | ICD-10-CM | POA: Diagnosis not present

## 2019-11-13 DIAGNOSIS — N2581 Secondary hyperparathyroidism of renal origin: Secondary | ICD-10-CM | POA: Diagnosis not present

## 2019-11-16 DIAGNOSIS — N186 End stage renal disease: Secondary | ICD-10-CM | POA: Diagnosis not present

## 2019-11-16 DIAGNOSIS — Z992 Dependence on renal dialysis: Secondary | ICD-10-CM | POA: Diagnosis not present

## 2019-11-16 DIAGNOSIS — N2581 Secondary hyperparathyroidism of renal origin: Secondary | ICD-10-CM | POA: Diagnosis not present

## 2019-11-18 DIAGNOSIS — N2581 Secondary hyperparathyroidism of renal origin: Secondary | ICD-10-CM | POA: Diagnosis not present

## 2019-11-18 DIAGNOSIS — N186 End stage renal disease: Secondary | ICD-10-CM | POA: Diagnosis not present

## 2019-11-18 DIAGNOSIS — Z992 Dependence on renal dialysis: Secondary | ICD-10-CM | POA: Diagnosis not present

## 2019-11-19 DIAGNOSIS — E103513 Type 1 diabetes mellitus with proliferative diabetic retinopathy with macular edema, bilateral: Secondary | ICD-10-CM | POA: Diagnosis not present

## 2019-11-19 DIAGNOSIS — E103511 Type 1 diabetes mellitus with proliferative diabetic retinopathy with macular edema, right eye: Secondary | ICD-10-CM | POA: Diagnosis not present

## 2019-11-20 DIAGNOSIS — Z992 Dependence on renal dialysis: Secondary | ICD-10-CM | POA: Diagnosis not present

## 2019-11-20 DIAGNOSIS — N2581 Secondary hyperparathyroidism of renal origin: Secondary | ICD-10-CM | POA: Diagnosis not present

## 2019-11-20 DIAGNOSIS — N186 End stage renal disease: Secondary | ICD-10-CM | POA: Diagnosis not present

## 2019-11-23 DIAGNOSIS — N186 End stage renal disease: Secondary | ICD-10-CM | POA: Diagnosis not present

## 2019-11-23 DIAGNOSIS — Z992 Dependence on renal dialysis: Secondary | ICD-10-CM | POA: Diagnosis not present

## 2019-11-23 DIAGNOSIS — N2581 Secondary hyperparathyroidism of renal origin: Secondary | ICD-10-CM | POA: Diagnosis not present

## 2019-11-25 DIAGNOSIS — N2581 Secondary hyperparathyroidism of renal origin: Secondary | ICD-10-CM | POA: Diagnosis not present

## 2019-11-25 DIAGNOSIS — Z992 Dependence on renal dialysis: Secondary | ICD-10-CM | POA: Diagnosis not present

## 2019-11-25 DIAGNOSIS — N186 End stage renal disease: Secondary | ICD-10-CM | POA: Diagnosis not present

## 2019-11-26 ENCOUNTER — Ambulatory Visit: Payer: Federal, State, Local not specified - PPO | Admitting: Gastroenterology

## 2019-11-26 ENCOUNTER — Encounter: Payer: Self-pay | Admitting: Gastroenterology

## 2019-11-26 VITALS — BP 128/74 | HR 88 | Temp 98.3°F | Ht 66.0 in | Wt 202.5 lb

## 2019-11-26 DIAGNOSIS — K219 Gastro-esophageal reflux disease without esophagitis: Secondary | ICD-10-CM

## 2019-11-26 DIAGNOSIS — K588 Other irritable bowel syndrome: Secondary | ICD-10-CM | POA: Diagnosis not present

## 2019-11-26 MED ORDER — OMEPRAZOLE 40 MG PO CPDR: 40.0000 mg | DELAYED_RELEASE_CAPSULE | Freq: Two times a day (BID) | ORAL | 11 refills | Status: DC

## 2019-11-26 MED ORDER — GLYCOPYRROLATE 2 MG PO TABS: 2.0000 mg | ORAL_TABLET | Freq: Two times a day (BID) | ORAL | 11 refills | Status: DC

## 2019-11-26 NOTE — Progress Notes (Signed)
    History of Present Illness: This is a 49 year old male returning for follow-up of GERD, IBS and IDA.  He states his reflux symptoms have generally been under very good control.  He has had difficulties with postprandial nausea, rumbling and discomfort generally after his evening meal.  He has been taking dicyclomine 20 mg before all meals.  His iron has been monitored by his PCP and the dialysis center.  He states he receives iron intermittently at the dialysis center.  EGD 10/2018 - Normal esophagus. - Non-bleeding erosive gastropathy. Biopsied. Path: reactive gastropathy - Erythematous duodenopathy. - Normal second portion of the duodenum.  Colonoscopy 11/2018 - One 8 mm polyp in the descending colon, removed with a cold snare. Resected and retrieved. Path: tubular adenoma - Internal hemorrhoids. - The examination was otherwise normal on direct and retroflexion views.  Current Medications, Allergies, Past Medical History, Past Surgical History, Family History and Social History were reviewed in [employer] record.   Physical Exam: General: Well developed, well nourished, no acute distress Head: Normocephalic and atraumatic Eyes:  sclerae anicteric, EOMI Ears: Normal auditory acuity Mouth: Not examined, mask on during Covid-19 pandemic Lungs: Clear throughout to auscultation Heart: Regular rate and rhythm; no murmurs, rubs or bruits Abdomen: Soft, non tender and non distended. No masses, hepatosplenomegaly or hernias noted. Normal Bowel sounds Rectal: Not done Musculoskeletal: Symmetrical with no gross deformities  Pulses:  Normal pulses noted Extremities: No clubbing, cyanosis, edema or deformities noted Neurological: Alert oriented x 4, grossly nonfocal Psychological:  Alert and cooperative. Normal mood and affect   Assessment and Recommendations:  1.  GERD.  Follow all standard antireflux measures.  Continue omeprazole 40 mg po bid. REV in 1  year.  2.  IBS.  Discontinue dicyclomine.  Begin glycopyrrolate 2 mg p.o. twice daily.  Assess for dietary stressors and try to avoid. REV in 1 year.  3.  IDA.  Further management by PCP and renal.

## 2019-11-26 NOTE — Patient Instructions (Signed)
Stop taking dicyclomine.   We have sent the following medications to your pharmacy for you to pick up at your convenience:omeprazole and glycopyrrolate.   Call back in 2 weeks if your symptoms have not improved taking the glycopyrrolate.   Thank you for choosing me and [facility].  Pricilla Riffle. Dagoberto Ligas., MD., [facility]

## 2019-11-27 DIAGNOSIS — Z992 Dependence on renal dialysis: Secondary | ICD-10-CM | POA: Diagnosis not present

## 2019-11-27 DIAGNOSIS — N186 End stage renal disease: Secondary | ICD-10-CM | POA: Diagnosis not present

## 2019-11-27 DIAGNOSIS — N2581 Secondary hyperparathyroidism of renal origin: Secondary | ICD-10-CM | POA: Diagnosis not present

## 2019-11-30 DIAGNOSIS — N186 End stage renal disease: Secondary | ICD-10-CM | POA: Diagnosis not present

## 2019-11-30 DIAGNOSIS — N2581 Secondary hyperparathyroidism of renal origin: Secondary | ICD-10-CM | POA: Diagnosis not present

## 2019-11-30 DIAGNOSIS — Z992 Dependence on renal dialysis: Secondary | ICD-10-CM | POA: Diagnosis not present

## 2019-12-02 DIAGNOSIS — N186 End stage renal disease: Secondary | ICD-10-CM | POA: Diagnosis not present

## 2019-12-02 DIAGNOSIS — N2581 Secondary hyperparathyroidism of renal origin: Secondary | ICD-10-CM | POA: Diagnosis not present

## 2019-12-02 DIAGNOSIS — Z992 Dependence on renal dialysis: Secondary | ICD-10-CM | POA: Diagnosis not present

## 2019-12-04 DIAGNOSIS — N2581 Secondary hyperparathyroidism of renal origin: Secondary | ICD-10-CM | POA: Diagnosis not present

## 2019-12-04 DIAGNOSIS — N186 End stage renal disease: Secondary | ICD-10-CM | POA: Diagnosis not present

## 2019-12-04 DIAGNOSIS — Z992 Dependence on renal dialysis: Secondary | ICD-10-CM | POA: Diagnosis not present

## 2019-12-07 DIAGNOSIS — N2581 Secondary hyperparathyroidism of renal origin: Secondary | ICD-10-CM | POA: Diagnosis not present

## 2019-12-07 DIAGNOSIS — Z992 Dependence on renal dialysis: Secondary | ICD-10-CM | POA: Diagnosis not present

## 2019-12-07 DIAGNOSIS — N186 End stage renal disease: Secondary | ICD-10-CM | POA: Diagnosis not present

## 2019-12-09 DIAGNOSIS — Z992 Dependence on renal dialysis: Secondary | ICD-10-CM | POA: Diagnosis not present

## 2019-12-09 DIAGNOSIS — N186 End stage renal disease: Secondary | ICD-10-CM | POA: Diagnosis not present

## 2019-12-09 DIAGNOSIS — N2581 Secondary hyperparathyroidism of renal origin: Secondary | ICD-10-CM | POA: Diagnosis not present

## 2019-12-10 ENCOUNTER — Ambulatory Visit (INDEPENDENT_AMBULATORY_CARE_PROVIDER_SITE_OTHER): Payer: Federal, State, Local not specified - PPO | Admitting: Internal Medicine

## 2019-12-10 ENCOUNTER — Other Ambulatory Visit: Payer: Self-pay

## 2019-12-10 VITALS — BP 120/82 | HR 100 | Temp 98.1°F | Ht 66.0 in | Wt 202.0 lb

## 2019-12-10 DIAGNOSIS — Z0000 Encounter for general adult medical examination without abnormal findings: Secondary | ICD-10-CM

## 2019-12-10 DIAGNOSIS — I10 Essential (primary) hypertension: Secondary | ICD-10-CM

## 2019-12-10 DIAGNOSIS — Z992 Dependence on renal dialysis: Secondary | ICD-10-CM

## 2019-12-10 DIAGNOSIS — G2581 Restless legs syndrome: Secondary | ICD-10-CM

## 2019-12-10 DIAGNOSIS — N186 End stage renal disease: Secondary | ICD-10-CM

## 2019-12-10 MED ORDER — ROPINIROLE HCL 2 MG PO TABS: ORAL_TABLET | ORAL | 1 refills | Status: DC

## 2019-12-10 NOTE — Progress Notes (Signed)
Subjective:    Patient ID: Hector Mahoney, male    DOB: 1971-05-06, 49 y.o.   MRN: [id number]  HPI  Here for wellness and f/u;  Overall doing ok;  Pt denies Chest pain, worsening SOB, DOE, wheezing, orthopnea, PND, worsening LE edema, palpitations, dizziness or syncope.  Pt denies neurological change such as new headache, facial or extremity weakness.  Pt denies polydipsia, polyuria, or low sugar symptoms. Pt states overall good compliance with treatment and medications, good tolerability, and has been trying to follow appropriate diet.  Pt denies worsening depressive symptoms, suicidal ideation or panic. No fever, night sweats, wt loss, loss of appetite, or other constitutional symptoms.  Pt states good ability with ADL's, has low fall risk, home safety reviewed and adequate, no other significant changes in hearing or vision, and only occasionally active with exercise. Also c/o worsening RLS symptoms where requip 1 mg just not working well now. States recent a1c 8.3 in feb 2021 followed per endo Past Medical History:  Diagnosis Date  . ABSCESS, TOOTH 04/23/2009  . AKI (acute kidney injury) ([place]) 08/2015  . Anemia   . CHF (congestive heart failure) ([place])   . Chronic kidney disease    dialysis M-W-F  . DIABETES MELLITUS, TYPE I 07/30/2007  . Esophageal reflux 04/27/2009  . FATIGUE 04/27/2009   Resolved per patient 06/03/19, no longer a problem  . HYPERLIPIDEMIA 04/24/2007  . HYPERTENSION 04/24/2007  . INSOMNIA-SLEEP DISORDER-UNSPEC 04/27/2009   resolved, no longer a problem per patient 06/03/19  . LUMBAR STRAIN, ACUTE 12/11/2008  . RASH-NONVESICULAR 03/11/2008   Past Surgical History:  Procedure Laterality Date  . AV FISTULA PLACEMENT Left 06/04/2019   Procedure: BRACHIAL-CEPHALIC ARTERIOVENOUS (AV) FISTULA CREATION LEFT ARM;  Surgeon: Rosetta Posner, MD;  Location: [facility] OR;  Service: Vascular;  Laterality: Left;  . COLONOSCOPY     polyp  . IR FLUORO GUIDE CV LINE RIGHT  10/22/2017  . IR US  GUIDE VASC ACCESS RIGHT  10/22/2017  . MOUTH SURGERY     tooth ext    reports that he quit smoking about 4 years ago. His smoking use included cigarettes. He has never used smokeless tobacco. He reports current alcohol use. He reports that he does not use drugs. family history includes Colon cancer in his maternal uncle; Colon polyps in his maternal uncle; Diabetes in his maternal aunt and mother; Heart disease in his maternal uncle; Hypertension in his mother; Lung cancer in his maternal grandfather. No Known Allergies Current Outpatient Medications on File Prior to Visit  Medication Sig Dispense Refill  . amLODipine (NORVASC) 5 MG tablet Take 1 tablet (5 mg total) by mouth daily. (Patient taking differently: Take 5 mg by mouth at bedtime. ) 30 tablet 1  . aspirin EC 81 MG EC tablet Take 1 tablet (81 mg total) by mouth daily. 30 tablet 0  . calcitRIOL (ROCALTROL) 0.25 MCG capsule Take 1 capsule (0.25 mcg total) by mouth every Monday, Wednesday, and Friday at 6 PM. 15 capsule 0  . calcium acetate (PHOSLO) 667 MG capsule Take 2 capsules (1,334 mg total) by mouth 3 (three) times daily with meals. (Patient taking differently: Take 2,001 mg by mouth 3 (three) times daily with meals. ) 180 capsule 3  . cinacalcet (SENSIPAR) 30 MG tablet Take 30 mg by mouth every evening.     Marland Kitchen glycopyrrolate (ROBINUL) 2 MG tablet Take 1 tablet (2 mg total) by mouth 2 (two) times daily. 60 tablet 11  . insulin aspart (  NOVOLOG) 100 UNIT/ML injection Inject 8-12 Units into the skin 3 (three) times daily with meals. 10 mL 11  . insulin glargine (LANTUS) 100 UNIT/ML injection Inject 0.25 mLs (25 Units total) into the skin at bedtime. 20 mL 5  . meclizine (ANTIVERT) 12.5 MG tablet Take 1 tablet (12.5 mg total) by mouth 3 (three) times daily as needed for dizziness. 40 tablet 2  . Methoxy PEG-Epoetin Beta (MIRCERA IJ) Mircera    . omeprazole (PRILOSEC) 40 MG capsule Take 1 capsule (40 mg total) by mouth 2 (two) times daily. 60  capsule 11  . rosuvastatin (CRESTOR) 10 MG tablet Take 1 tablet (10 mg total) by mouth daily. 30 tablet 3  . Tuberculin PPD (TUBERSOL ID) Inject into the skin.    Marland Kitchen VITAMIN D PO Take by mouth.    . Vitamin D, Ergocalciferol, (DRISDOL) 1.25 MG (50000 UT) CAPS capsule Take 1 capsule (50,000 Units total) by mouth every 7 (seven) days. 12 capsule 0   No current facility-administered medications on file prior to visit.   Review of Systems All otherwise neg per pt     Objective:   Physical Exam BP 120/82 (BP Location: Right Arm, Patient Position: Sitting, Cuff Size: Large)   Pulse 100   Temp 98.1 F (36.7 C) (Oral)   Ht 5\' 6"  (1.676 m)   Wt 202 lb (91.6 kg)   SpO2 99%   BMI 32.60 kg/m  VS noted,  Constitutional: Pt appears in NAD HENT: Head: NCAT.  Right Ear: External ear normal.  Left Ear: External ear normal.  Eyes: . Pupils are equal, round, and reactive to light. Conjunctivae and EOM are normal Nose: without d/c or deformity Neck: Neck supple. Gross normal ROM Cardiovascular: Normal rate and regular rhythm.   Pulmonary/Chest: Effort normal and breath sounds without rales or wheezing.  Abd:  Soft, NT, ND, + BS, no organomegaly Neurological: Pt is alert. At baseline orientation, motor grossly intact Skin: Skin is warm. No rashes, other new lesions, no LE edema Psychiatric: Pt behavior is normal without agitation  All otherwise neg per pt Lab Results  Component Value Date   WBC 8.2 06/25/2019   HGB 12.3 (L) 06/25/2019   HCT 37.5 (L) 06/25/2019   PLT 261.0 06/25/2019   GLUCOSE 173 (H) 06/25/2019   CHOL 183 06/25/2019   TRIG 214.0 (H) 06/25/2019   HDL 58.40 06/25/2019   LDLDIRECT 80.0 06/25/2019   ALT 15 06/25/2019   AST 18 06/25/2019   NA 141 06/25/2019   K 4.3 06/25/2019   CL 97 06/25/2019   CREATININE 8.83 (HH) 06/25/2019   BUN 51 (H) 06/25/2019   CO2 29 06/25/2019   TSH 2.03 06/25/2019   PSA 1.39 06/25/2019   INR 1.05 10/21/2017   HGBA1C 9.3 (H) 06/25/2019       Assessment & Plan:

## 2019-12-10 NOTE — Patient Instructions (Signed)
Ok to increase the requip dosing  Please continue all other medications as before, and refills have been done if requested.  Please have the pharmacy call with any other refills you may need.  Please continue your efforts at being more active, low cholesterol diet, and weight control.  You are otherwise up to date with prevention measures today.  Please keep your appointments with your specialists as you may have planned  Please make an Appointment to return in 6 months, or sooner if needed

## 2019-12-11 DIAGNOSIS — E1022 Type 1 diabetes mellitus with diabetic chronic kidney disease: Secondary | ICD-10-CM | POA: Diagnosis not present

## 2019-12-11 DIAGNOSIS — Z992 Dependence on renal dialysis: Secondary | ICD-10-CM | POA: Diagnosis not present

## 2019-12-11 DIAGNOSIS — N186 End stage renal disease: Secondary | ICD-10-CM | POA: Diagnosis not present

## 2019-12-11 DIAGNOSIS — N2581 Secondary hyperparathyroidism of renal origin: Secondary | ICD-10-CM | POA: Diagnosis not present

## 2019-12-12 ENCOUNTER — Encounter: Payer: Self-pay | Admitting: Internal Medicine

## 2019-12-12 NOTE — Assessment & Plan Note (Signed)
Now on [place] pancreas-renal transplant list in [place]

## 2019-12-12 NOTE — Assessment & Plan Note (Signed)
stable overall by history and exam, recent data reviewed with pt, and pt to continue medical treatment as before,  to f/u any worsening symptoms or concerns  

## 2019-12-12 NOTE — Assessment & Plan Note (Signed)
[place] for increased requip 2-4 mg qhs prn

## 2019-12-12 NOTE — Assessment & Plan Note (Signed)

## 2019-12-14 DIAGNOSIS — N2581 Secondary hyperparathyroidism of renal origin: Secondary | ICD-10-CM | POA: Diagnosis not present

## 2019-12-14 DIAGNOSIS — N186 End stage renal disease: Secondary | ICD-10-CM | POA: Diagnosis not present

## 2019-12-14 DIAGNOSIS — Z992 Dependence on renal dialysis: Secondary | ICD-10-CM | POA: Diagnosis not present

## 2019-12-16 DIAGNOSIS — N186 End stage renal disease: Secondary | ICD-10-CM | POA: Diagnosis not present

## 2019-12-16 DIAGNOSIS — N2581 Secondary hyperparathyroidism of renal origin: Secondary | ICD-10-CM | POA: Diagnosis not present

## 2019-12-16 DIAGNOSIS — Z992 Dependence on renal dialysis: Secondary | ICD-10-CM | POA: Diagnosis not present

## 2019-12-18 DIAGNOSIS — N2581 Secondary hyperparathyroidism of renal origin: Secondary | ICD-10-CM | POA: Diagnosis not present

## 2019-12-18 DIAGNOSIS — N186 End stage renal disease: Secondary | ICD-10-CM | POA: Diagnosis not present

## 2019-12-18 DIAGNOSIS — Z992 Dependence on renal dialysis: Secondary | ICD-10-CM | POA: Diagnosis not present

## 2019-12-21 DIAGNOSIS — Z992 Dependence on renal dialysis: Secondary | ICD-10-CM | POA: Diagnosis not present

## 2019-12-21 DIAGNOSIS — N2581 Secondary hyperparathyroidism of renal origin: Secondary | ICD-10-CM | POA: Diagnosis not present

## 2019-12-21 DIAGNOSIS — N186 End stage renal disease: Secondary | ICD-10-CM | POA: Diagnosis not present

## 2019-12-23 DIAGNOSIS — N186 End stage renal disease: Secondary | ICD-10-CM | POA: Diagnosis not present

## 2019-12-23 DIAGNOSIS — Z992 Dependence on renal dialysis: Secondary | ICD-10-CM | POA: Diagnosis not present

## 2019-12-23 DIAGNOSIS — N2581 Secondary hyperparathyroidism of renal origin: Secondary | ICD-10-CM | POA: Diagnosis not present

## 2019-12-25 DIAGNOSIS — Z992 Dependence on renal dialysis: Secondary | ICD-10-CM | POA: Diagnosis not present

## 2019-12-25 DIAGNOSIS — N186 End stage renal disease: Secondary | ICD-10-CM | POA: Diagnosis not present

## 2019-12-25 DIAGNOSIS — N2581 Secondary hyperparathyroidism of renal origin: Secondary | ICD-10-CM | POA: Diagnosis not present

## 2019-12-26 DIAGNOSIS — R112 Nausea with vomiting, unspecified: Secondary | ICD-10-CM | POA: Diagnosis not present

## 2019-12-26 DIAGNOSIS — E1022 Type 1 diabetes mellitus with diabetic chronic kidney disease: Secondary | ICD-10-CM | POA: Diagnosis not present

## 2019-12-26 DIAGNOSIS — Z992 Dependence on renal dialysis: Secondary | ICD-10-CM | POA: Diagnosis not present

## 2019-12-26 DIAGNOSIS — N186 End stage renal disease: Secondary | ICD-10-CM | POA: Diagnosis not present

## 2019-12-28 DIAGNOSIS — Z992 Dependence on renal dialysis: Secondary | ICD-10-CM | POA: Diagnosis not present

## 2019-12-28 DIAGNOSIS — N186 End stage renal disease: Secondary | ICD-10-CM | POA: Diagnosis not present

## 2019-12-28 DIAGNOSIS — N2581 Secondary hyperparathyroidism of renal origin: Secondary | ICD-10-CM | POA: Diagnosis not present

## 2019-12-30 DIAGNOSIS — Z992 Dependence on renal dialysis: Secondary | ICD-10-CM | POA: Diagnosis not present

## 2019-12-30 DIAGNOSIS — N186 End stage renal disease: Secondary | ICD-10-CM | POA: Diagnosis not present

## 2019-12-30 DIAGNOSIS — N2581 Secondary hyperparathyroidism of renal origin: Secondary | ICD-10-CM | POA: Diagnosis not present

## 2020-01-01 DIAGNOSIS — N186 End stage renal disease: Secondary | ICD-10-CM | POA: Diagnosis not present

## 2020-01-01 DIAGNOSIS — Z992 Dependence on renal dialysis: Secondary | ICD-10-CM | POA: Diagnosis not present

## 2020-01-01 DIAGNOSIS — N2581 Secondary hyperparathyroidism of renal origin: Secondary | ICD-10-CM | POA: Diagnosis not present

## 2020-01-04 DIAGNOSIS — Z992 Dependence on renal dialysis: Secondary | ICD-10-CM | POA: Diagnosis not present

## 2020-01-04 DIAGNOSIS — N186 End stage renal disease: Secondary | ICD-10-CM | POA: Diagnosis not present

## 2020-01-04 DIAGNOSIS — N2581 Secondary hyperparathyroidism of renal origin: Secondary | ICD-10-CM | POA: Diagnosis not present

## 2020-01-06 DIAGNOSIS — N186 End stage renal disease: Secondary | ICD-10-CM | POA: Diagnosis not present

## 2020-01-06 DIAGNOSIS — N2581 Secondary hyperparathyroidism of renal origin: Secondary | ICD-10-CM | POA: Diagnosis not present

## 2020-01-06 DIAGNOSIS — Z992 Dependence on renal dialysis: Secondary | ICD-10-CM | POA: Diagnosis not present

## 2020-01-08 DIAGNOSIS — N186 End stage renal disease: Secondary | ICD-10-CM | POA: Diagnosis not present

## 2020-01-08 DIAGNOSIS — Z992 Dependence on renal dialysis: Secondary | ICD-10-CM | POA: Diagnosis not present

## 2020-01-08 DIAGNOSIS — N2581 Secondary hyperparathyroidism of renal origin: Secondary | ICD-10-CM | POA: Diagnosis not present

## 2020-01-11 DIAGNOSIS — E1022 Type 1 diabetes mellitus with diabetic chronic kidney disease: Secondary | ICD-10-CM | POA: Diagnosis not present

## 2020-01-11 DIAGNOSIS — N2581 Secondary hyperparathyroidism of renal origin: Secondary | ICD-10-CM | POA: Diagnosis not present

## 2020-01-11 DIAGNOSIS — N186 End stage renal disease: Secondary | ICD-10-CM | POA: Diagnosis not present

## 2020-01-11 DIAGNOSIS — Z992 Dependence on renal dialysis: Secondary | ICD-10-CM | POA: Diagnosis not present

## 2020-01-13 DIAGNOSIS — Z23 Encounter for immunization: Secondary | ICD-10-CM | POA: Diagnosis not present

## 2020-01-13 DIAGNOSIS — N186 End stage renal disease: Secondary | ICD-10-CM | POA: Diagnosis not present

## 2020-01-13 DIAGNOSIS — Z992 Dependence on renal dialysis: Secondary | ICD-10-CM | POA: Diagnosis not present

## 2020-01-13 DIAGNOSIS — N2581 Secondary hyperparathyroidism of renal origin: Secondary | ICD-10-CM | POA: Diagnosis not present

## 2020-01-15 DIAGNOSIS — N2581 Secondary hyperparathyroidism of renal origin: Secondary | ICD-10-CM | POA: Diagnosis not present

## 2020-01-15 DIAGNOSIS — Z992 Dependence on renal dialysis: Secondary | ICD-10-CM | POA: Diagnosis not present

## 2020-01-15 DIAGNOSIS — N186 End stage renal disease: Secondary | ICD-10-CM | POA: Diagnosis not present

## 2020-01-15 DIAGNOSIS — Z23 Encounter for immunization: Secondary | ICD-10-CM | POA: Diagnosis not present

## 2020-01-18 DIAGNOSIS — Z992 Dependence on renal dialysis: Secondary | ICD-10-CM | POA: Diagnosis not present

## 2020-01-18 DIAGNOSIS — N186 End stage renal disease: Secondary | ICD-10-CM | POA: Diagnosis not present

## 2020-01-18 DIAGNOSIS — N2581 Secondary hyperparathyroidism of renal origin: Secondary | ICD-10-CM | POA: Diagnosis not present

## 2020-01-20 DIAGNOSIS — N186 End stage renal disease: Secondary | ICD-10-CM | POA: Diagnosis not present

## 2020-01-20 DIAGNOSIS — N2581 Secondary hyperparathyroidism of renal origin: Secondary | ICD-10-CM | POA: Diagnosis not present

## 2020-01-20 DIAGNOSIS — Z992 Dependence on renal dialysis: Secondary | ICD-10-CM | POA: Diagnosis not present

## 2020-01-22 DIAGNOSIS — N186 End stage renal disease: Secondary | ICD-10-CM | POA: Diagnosis not present

## 2020-01-22 DIAGNOSIS — N2581 Secondary hyperparathyroidism of renal origin: Secondary | ICD-10-CM | POA: Diagnosis not present

## 2020-01-22 DIAGNOSIS — Z992 Dependence on renal dialysis: Secondary | ICD-10-CM | POA: Diagnosis not present

## 2020-01-25 DIAGNOSIS — N2581 Secondary hyperparathyroidism of renal origin: Secondary | ICD-10-CM | POA: Diagnosis not present

## 2020-01-25 DIAGNOSIS — N186 End stage renal disease: Secondary | ICD-10-CM | POA: Diagnosis not present

## 2020-01-25 DIAGNOSIS — Z992 Dependence on renal dialysis: Secondary | ICD-10-CM | POA: Diagnosis not present

## 2020-01-27 DIAGNOSIS — N186 End stage renal disease: Secondary | ICD-10-CM | POA: Diagnosis not present

## 2020-01-27 DIAGNOSIS — N2581 Secondary hyperparathyroidism of renal origin: Secondary | ICD-10-CM | POA: Diagnosis not present

## 2020-01-27 DIAGNOSIS — Z992 Dependence on renal dialysis: Secondary | ICD-10-CM | POA: Diagnosis not present

## 2020-01-29 DIAGNOSIS — Z992 Dependence on renal dialysis: Secondary | ICD-10-CM | POA: Diagnosis not present

## 2020-01-29 DIAGNOSIS — N2581 Secondary hyperparathyroidism of renal origin: Secondary | ICD-10-CM | POA: Diagnosis not present

## 2020-01-29 DIAGNOSIS — N186 End stage renal disease: Secondary | ICD-10-CM | POA: Diagnosis not present

## 2020-02-01 DIAGNOSIS — N2581 Secondary hyperparathyroidism of renal origin: Secondary | ICD-10-CM | POA: Diagnosis not present

## 2020-02-01 DIAGNOSIS — N186 End stage renal disease: Secondary | ICD-10-CM | POA: Diagnosis not present

## 2020-02-01 DIAGNOSIS — Z992 Dependence on renal dialysis: Secondary | ICD-10-CM | POA: Diagnosis not present

## 2020-02-03 DIAGNOSIS — N186 End stage renal disease: Secondary | ICD-10-CM | POA: Diagnosis not present

## 2020-02-03 DIAGNOSIS — N2581 Secondary hyperparathyroidism of renal origin: Secondary | ICD-10-CM | POA: Diagnosis not present

## 2020-02-03 DIAGNOSIS — Z992 Dependence on renal dialysis: Secondary | ICD-10-CM | POA: Diagnosis not present

## 2020-02-05 DIAGNOSIS — N2581 Secondary hyperparathyroidism of renal origin: Secondary | ICD-10-CM | POA: Diagnosis not present

## 2020-02-05 DIAGNOSIS — Z992 Dependence on renal dialysis: Secondary | ICD-10-CM | POA: Diagnosis not present

## 2020-02-05 DIAGNOSIS — N186 End stage renal disease: Secondary | ICD-10-CM | POA: Diagnosis not present

## 2020-02-08 DIAGNOSIS — E1121 Type 2 diabetes mellitus with diabetic nephropathy: Secondary | ICD-10-CM | POA: Diagnosis not present

## 2020-02-08 DIAGNOSIS — Z992 Dependence on renal dialysis: Secondary | ICD-10-CM | POA: Diagnosis not present

## 2020-02-08 DIAGNOSIS — N2581 Secondary hyperparathyroidism of renal origin: Secondary | ICD-10-CM | POA: Diagnosis not present

## 2020-02-08 DIAGNOSIS — N186 End stage renal disease: Secondary | ICD-10-CM | POA: Diagnosis not present

## 2020-02-10 DIAGNOSIS — E1022 Type 1 diabetes mellitus with diabetic chronic kidney disease: Secondary | ICD-10-CM | POA: Diagnosis not present

## 2020-02-10 DIAGNOSIS — N2581 Secondary hyperparathyroidism of renal origin: Secondary | ICD-10-CM | POA: Diagnosis not present

## 2020-02-10 DIAGNOSIS — E1121 Type 2 diabetes mellitus with diabetic nephropathy: Secondary | ICD-10-CM | POA: Diagnosis not present

## 2020-02-10 DIAGNOSIS — N186 End stage renal disease: Secondary | ICD-10-CM | POA: Diagnosis not present

## 2020-02-10 DIAGNOSIS — Z992 Dependence on renal dialysis: Secondary | ICD-10-CM | POA: Diagnosis not present

## 2020-02-12 DIAGNOSIS — Z992 Dependence on renal dialysis: Secondary | ICD-10-CM | POA: Diagnosis not present

## 2020-02-12 DIAGNOSIS — N186 End stage renal disease: Secondary | ICD-10-CM | POA: Diagnosis not present

## 2020-02-12 DIAGNOSIS — N2581 Secondary hyperparathyroidism of renal origin: Secondary | ICD-10-CM | POA: Diagnosis not present

## 2020-02-15 DIAGNOSIS — N2581 Secondary hyperparathyroidism of renal origin: Secondary | ICD-10-CM | POA: Diagnosis not present

## 2020-02-15 DIAGNOSIS — N186 End stage renal disease: Secondary | ICD-10-CM | POA: Diagnosis not present

## 2020-02-15 DIAGNOSIS — Z992 Dependence on renal dialysis: Secondary | ICD-10-CM | POA: Diagnosis not present

## 2020-02-17 DIAGNOSIS — N2581 Secondary hyperparathyroidism of renal origin: Secondary | ICD-10-CM | POA: Diagnosis not present

## 2020-02-17 DIAGNOSIS — N186 End stage renal disease: Secondary | ICD-10-CM | POA: Diagnosis not present

## 2020-02-17 DIAGNOSIS — Z992 Dependence on renal dialysis: Secondary | ICD-10-CM | POA: Diagnosis not present

## 2020-02-19 DIAGNOSIS — N186 End stage renal disease: Secondary | ICD-10-CM | POA: Diagnosis not present

## 2020-02-19 DIAGNOSIS — N2581 Secondary hyperparathyroidism of renal origin: Secondary | ICD-10-CM | POA: Diagnosis not present

## 2020-02-19 DIAGNOSIS — Z992 Dependence on renal dialysis: Secondary | ICD-10-CM | POA: Diagnosis not present

## 2020-02-22 DIAGNOSIS — N186 End stage renal disease: Secondary | ICD-10-CM | POA: Diagnosis not present

## 2020-02-22 DIAGNOSIS — Z992 Dependence on renal dialysis: Secondary | ICD-10-CM | POA: Diagnosis not present

## 2020-02-22 DIAGNOSIS — N2581 Secondary hyperparathyroidism of renal origin: Secondary | ICD-10-CM | POA: Diagnosis not present

## 2020-02-23 DIAGNOSIS — E11319 Type 2 diabetes mellitus with unspecified diabetic retinopathy without macular edema: Secondary | ICD-10-CM | POA: Diagnosis not present

## 2020-02-23 DIAGNOSIS — Z794 Long term (current) use of insulin: Secondary | ICD-10-CM | POA: Diagnosis not present

## 2020-02-23 DIAGNOSIS — N186 End stage renal disease: Secondary | ICD-10-CM | POA: Diagnosis not present

## 2020-02-23 DIAGNOSIS — E669 Obesity, unspecified: Secondary | ICD-10-CM | POA: Diagnosis not present

## 2020-02-23 DIAGNOSIS — E1121 Type 2 diabetes mellitus with diabetic nephropathy: Secondary | ICD-10-CM | POA: Diagnosis not present

## 2020-02-23 DIAGNOSIS — Z992 Dependence on renal dialysis: Secondary | ICD-10-CM | POA: Diagnosis not present

## 2020-02-23 DIAGNOSIS — K219 Gastro-esophageal reflux disease without esophagitis: Secondary | ICD-10-CM | POA: Diagnosis not present

## 2020-02-23 DIAGNOSIS — E1122 Type 2 diabetes mellitus with diabetic chronic kidney disease: Secondary | ICD-10-CM | POA: Diagnosis not present

## 2020-02-23 DIAGNOSIS — G2581 Restless legs syndrome: Secondary | ICD-10-CM | POA: Diagnosis not present

## 2020-02-23 DIAGNOSIS — I120 Hypertensive chronic kidney disease with stage 5 chronic kidney disease or end stage renal disease: Secondary | ICD-10-CM | POA: Diagnosis not present

## 2020-02-24 DIAGNOSIS — N2581 Secondary hyperparathyroidism of renal origin: Secondary | ICD-10-CM | POA: Diagnosis not present

## 2020-02-24 DIAGNOSIS — N186 End stage renal disease: Secondary | ICD-10-CM | POA: Diagnosis not present

## 2020-02-24 DIAGNOSIS — Z992 Dependence on renal dialysis: Secondary | ICD-10-CM | POA: Diagnosis not present

## 2020-02-26 DIAGNOSIS — N186 End stage renal disease: Secondary | ICD-10-CM | POA: Diagnosis not present

## 2020-02-26 DIAGNOSIS — N2581 Secondary hyperparathyroidism of renal origin: Secondary | ICD-10-CM | POA: Diagnosis not present

## 2020-02-26 DIAGNOSIS — Z992 Dependence on renal dialysis: Secondary | ICD-10-CM | POA: Diagnosis not present

## 2020-02-29 DIAGNOSIS — Z992 Dependence on renal dialysis: Secondary | ICD-10-CM | POA: Diagnosis not present

## 2020-02-29 DIAGNOSIS — I440 Atrioventricular block, first degree: Secondary | ICD-10-CM | POA: Diagnosis not present

## 2020-02-29 DIAGNOSIS — N186 End stage renal disease: Secondary | ICD-10-CM | POA: Diagnosis not present

## 2020-02-29 DIAGNOSIS — N2581 Secondary hyperparathyroidism of renal origin: Secondary | ICD-10-CM | POA: Diagnosis not present

## 2020-03-02 DIAGNOSIS — Z992 Dependence on renal dialysis: Secondary | ICD-10-CM | POA: Diagnosis not present

## 2020-03-02 DIAGNOSIS — N186 End stage renal disease: Secondary | ICD-10-CM | POA: Diagnosis not present

## 2020-03-02 DIAGNOSIS — N2581 Secondary hyperparathyroidism of renal origin: Secondary | ICD-10-CM | POA: Diagnosis not present

## 2020-03-03 DIAGNOSIS — H3582 Retinal ischemia: Secondary | ICD-10-CM | POA: Diagnosis not present

## 2020-03-03 DIAGNOSIS — E103513 Type 1 diabetes mellitus with proliferative diabetic retinopathy with macular edema, bilateral: Secondary | ICD-10-CM | POA: Diagnosis not present

## 2020-03-03 DIAGNOSIS — H35033 Hypertensive retinopathy, bilateral: Secondary | ICD-10-CM | POA: Diagnosis not present

## 2020-03-04 DIAGNOSIS — N186 End stage renal disease: Secondary | ICD-10-CM | POA: Diagnosis not present

## 2020-03-04 DIAGNOSIS — N2581 Secondary hyperparathyroidism of renal origin: Secondary | ICD-10-CM | POA: Diagnosis not present

## 2020-03-04 DIAGNOSIS — Z992 Dependence on renal dialysis: Secondary | ICD-10-CM | POA: Diagnosis not present

## 2020-03-07 DIAGNOSIS — Z992 Dependence on renal dialysis: Secondary | ICD-10-CM | POA: Diagnosis not present

## 2020-03-07 DIAGNOSIS — N186 End stage renal disease: Secondary | ICD-10-CM | POA: Diagnosis not present

## 2020-03-07 DIAGNOSIS — N2581 Secondary hyperparathyroidism of renal origin: Secondary | ICD-10-CM | POA: Diagnosis not present

## 2020-03-09 DIAGNOSIS — N186 End stage renal disease: Secondary | ICD-10-CM | POA: Diagnosis not present

## 2020-03-09 DIAGNOSIS — Z992 Dependence on renal dialysis: Secondary | ICD-10-CM | POA: Diagnosis not present

## 2020-03-09 DIAGNOSIS — N2581 Secondary hyperparathyroidism of renal origin: Secondary | ICD-10-CM | POA: Diagnosis not present

## 2020-03-11 DIAGNOSIS — Z992 Dependence on renal dialysis: Secondary | ICD-10-CM | POA: Diagnosis not present

## 2020-03-11 DIAGNOSIS — N2581 Secondary hyperparathyroidism of renal origin: Secondary | ICD-10-CM | POA: Diagnosis not present

## 2020-03-11 DIAGNOSIS — N186 End stage renal disease: Secondary | ICD-10-CM | POA: Diagnosis not present

## 2020-03-12 DIAGNOSIS — E1022 Type 1 diabetes mellitus with diabetic chronic kidney disease: Secondary | ICD-10-CM | POA: Diagnosis not present

## 2020-03-12 DIAGNOSIS — N186 End stage renal disease: Secondary | ICD-10-CM | POA: Diagnosis not present

## 2020-03-12 DIAGNOSIS — Z992 Dependence on renal dialysis: Secondary | ICD-10-CM | POA: Diagnosis not present

## 2020-03-14 DIAGNOSIS — Z992 Dependence on renal dialysis: Secondary | ICD-10-CM | POA: Diagnosis not present

## 2020-03-14 DIAGNOSIS — N186 End stage renal disease: Secondary | ICD-10-CM | POA: Diagnosis not present

## 2020-03-14 DIAGNOSIS — N2581 Secondary hyperparathyroidism of renal origin: Secondary | ICD-10-CM | POA: Diagnosis not present

## 2020-03-15 DIAGNOSIS — F4321 Adjustment disorder with depressed mood: Secondary | ICD-10-CM | POA: Diagnosis not present

## 2020-03-16 ENCOUNTER — Ambulatory Visit: Payer: Federal, State, Local not specified - PPO | Admitting: Internal Medicine

## 2020-03-16 DIAGNOSIS — N2581 Secondary hyperparathyroidism of renal origin: Secondary | ICD-10-CM | POA: Diagnosis not present

## 2020-03-16 DIAGNOSIS — Z992 Dependence on renal dialysis: Secondary | ICD-10-CM | POA: Diagnosis not present

## 2020-03-16 DIAGNOSIS — N186 End stage renal disease: Secondary | ICD-10-CM | POA: Diagnosis not present

## 2020-03-17 ENCOUNTER — Ambulatory Visit (INDEPENDENT_AMBULATORY_CARE_PROVIDER_SITE_OTHER): Payer: Federal, State, Local not specified - PPO | Admitting: Internal Medicine

## 2020-03-17 ENCOUNTER — Encounter: Payer: Self-pay | Admitting: Internal Medicine

## 2020-03-17 ENCOUNTER — Other Ambulatory Visit: Payer: Self-pay

## 2020-03-17 VITALS — BP 142/90 | HR 104 | Temp 98.2°F | Wt 202.4 lb

## 2020-03-17 DIAGNOSIS — J309 Allergic rhinitis, unspecified: Secondary | ICD-10-CM | POA: Diagnosis not present

## 2020-03-17 DIAGNOSIS — I10 Essential (primary) hypertension: Secondary | ICD-10-CM

## 2020-03-17 DIAGNOSIS — G2581 Restless legs syndrome: Secondary | ICD-10-CM | POA: Diagnosis not present

## 2020-03-17 MED ORDER — TRIAMCINOLONE ACETONIDE 55 MCG/ACT NA AERO: 2.0000 | INHALATION_SPRAY | Freq: Every day | NASAL | 12 refills | Status: DC

## 2020-03-17 MED ORDER — FEXOFENADINE HCL 180 MG PO TABS: 180.0000 mg | ORAL_TABLET | Freq: Every day | ORAL | 3 refills | Status: DC

## 2020-03-17 MED ORDER — GABAPENTIN 300 MG PO CAPS: ORAL_CAPSULE | ORAL | 1 refills | Status: DC

## 2020-03-17 NOTE — Patient Instructions (Addendum)
Ok to stop the ropinarole  Please take all new medication as prescribed  - the gabapentin for the legs, and the allegra and nasacort for allergies  Please continue all other medications as before, and refills have been done if requested.  Please have the pharmacy call with any other refills you may need.  Please continue your efforts at being more active, low cholesterol diet, and weight control.  Please keep your appointments with your specialists as you may have planned  Please make an Appointment to return in 6 months, or sooner if needed

## 2020-03-17 NOTE — Assessment & Plan Note (Signed)
Mild to mod, for allegra and nasacort asd,   to f/u any worsening symptoms or concerns 

## 2020-03-17 NOTE — Assessment & Plan Note (Addendum)
Ok for change to gabapentin qhs prn  I spent 31 minutes in preparing to see the patient by review of recent labs, imaging and procedures, obtaining and reviewing separately obtained history, communicating with the patient and family or caregiver, ordering medications, tests or procedures, and documenting clinical information in the EHR including the differential Dx, treatment, and any further evaluation and other management of RLS, htn, allergies

## 2020-03-17 NOTE — Progress Notes (Signed)
Subjective:    Patient ID: Hector Mahoney, male    DOB: 04-05-1971, 49 y.o.   MRN: [id number]  HPI  Here to f/u - Ropinarone not working well for RLS. Still seems to have leg twitching right > left, usually happens mostly with legs elevated such as with sitting in a recliner or at night with lying down.  Also, Does have several wks ongoing nasal allergy symptoms with clearish congestion, itch and sneezing, without fever, pain, ST, swelling or wheezing, but has a cough that leads to gag and vomit at times.  Pt denies new neurological symptoms such as new headache, or facial or extremity weakness or numbness   Pt denies polydipsia, polyuria,  Pt denies fever, wt loss, night sweats, loss of appetite, or other constitutional symptoms Past Medical History:  Diagnosis Date  . ABSCESS, TOOTH 04/23/2009  . AKI (acute kidney injury) ([place]) 08/2015  . Anemia   . CHF (congestive heart failure) ([place])   . Chronic kidney disease    dialysis M-W-F  . DIABETES MELLITUS, TYPE I 07/30/2007  . Esophageal reflux 04/27/2009  . FATIGUE 04/27/2009   Resolved per patient 06/03/19, no longer a problem  . HYPERLIPIDEMIA 04/24/2007  . HYPERTENSION 04/24/2007  . INSOMNIA-SLEEP DISORDER-UNSPEC 04/27/2009   resolved, no longer a problem per patient 06/03/19  . LUMBAR STRAIN, ACUTE 12/11/2008  . RASH-NONVESICULAR 03/11/2008   Past Surgical History:  Procedure Laterality Date  . AV FISTULA PLACEMENT Left 06/04/2019   Procedure: BRACHIAL-CEPHALIC ARTERIOVENOUS (AV) FISTULA CREATION LEFT ARM;  Surgeon: Rosetta Posner, MD;  Location: [facility] OR;  Service: Vascular;  Laterality: Left;  . COLONOSCOPY     polyp  . IR FLUORO GUIDE CV LINE RIGHT  10/22/2017  . IR US GUIDE VASC ACCESS RIGHT  10/22/2017  . MOUTH SURGERY     tooth ext    reports that he quit smoking about 4 years ago. His smoking use included cigarettes. He has never used smokeless tobacco. He reports current alcohol use. He reports that he does not use drugs. family  history includes Colon cancer in his maternal uncle; Colon polyps in his maternal uncle; Diabetes in his maternal aunt and mother; Heart disease in his maternal uncle; Hypertension in his mother; Lung cancer in his maternal grandfather. No Known Allergies Current Outpatient Medications on File Prior to Visit  Medication Sig Dispense Refill  . amLODipine (NORVASC) 5 MG tablet Take 1 tablet (5 mg total) by mouth daily. (Patient taking differently: Take 5 mg by mouth at bedtime. ) 30 tablet 1  . aspirin EC 81 MG EC tablet Take 1 tablet (81 mg total) by mouth daily. 30 tablet 0  . calcitRIOL (ROCALTROL) 0.25 MCG capsule Take 1 capsule (0.25 mcg total) by mouth every Monday, Wednesday, and Friday at 6 PM. 15 capsule 0  . calcium acetate (PHOSLO) 667 MG capsule Take 2 capsules (1,334 mg total) by mouth 3 (three) times daily with meals. (Patient taking differently: Take 2,001 mg by mouth 3 (three) times daily with meals. ) 180 capsule 3  . cinacalcet (SENSIPAR) 30 MG tablet Take 30 mg by mouth every evening.     Marland Kitchen glycopyrrolate (ROBINUL) 2 MG tablet Take 1 tablet (2 mg total) by mouth 2 (two) times daily. 60 tablet 11  . insulin aspart (NOVOLOG) 100 UNIT/ML injection Inject 8-12 Units into the skin 3 (three) times daily with meals. 10 mL 11  . insulin glargine (LANTUS) 100 UNIT/ML injection Inject 0.25 mLs (25 Units total) into  the skin at bedtime. 20 mL 5  . meclizine (ANTIVERT) 12.5 MG tablet Take 1 tablet (12.5 mg total) by mouth 3 (three) times daily as needed for dizziness. 40 tablet 2  . Methoxy PEG-Epoetin Beta (MIRCERA IJ) Mircera    . omeprazole (PRILOSEC) 40 MG capsule Take 1 capsule (40 mg total) by mouth 2 (two) times daily. 60 capsule 11  . rosuvastatin (CRESTOR) 10 MG tablet Take 1 tablet (10 mg total) by mouth daily. 30 tablet 3  . Tuberculin PPD (TUBERSOL ID) Inject into the skin.    Marland Kitchen VITAMIN D PO Take by mouth.     No current facility-administered medications on file prior to visit.    Review of Systems All otherwise neg per pt     Objective:   Physical Exam BP (!) 142/90 (BP Location: Right Arm)   Pulse (!) 104   Temp 98.2 F (36.8 C) (Oral)   Wt 202 lb 6.4 oz (91.8 kg)   SpO2 97%   BMI 32.67 kg/m  VS noted,  Constitutional: Pt appears in NAD HENT: Head: NCAT.  Right Ear: External ear normal.  Left Ear: External ear normal.  Eyes: . Pupils are equal, round, and reactive to light. Conjunctivae and EOM are normal Nose: without d/c or deformity Neck: Neck supple. Gross normal ROM Cardiovascular: Normal rate and regular rhythm.   Pulmonary/Chest: Effort normal and breath sounds without rales or wheezing.  Abd:  Soft, NT, ND, + BS, no organomegaly Neurological: Pt is alert. At baseline orientation, motor grossly intact Skin: Skin is warm. No rashes, other new lesions, no LE edema Psychiatric: Pt behavior is normal without agitation  All otherwise neg per pt Lab Results  Component Value Date   WBC 8.2 06/25/2019   HGB 12.3 (L) 06/25/2019   HCT 37.5 (L) 06/25/2019   PLT 261.0 06/25/2019   GLUCOSE 173 (H) 06/25/2019   CHOL 183 06/25/2019   TRIG 214.0 (H) 06/25/2019   HDL 58.40 06/25/2019   LDLDIRECT 80.0 06/25/2019   ALT 15 06/25/2019   AST 18 06/25/2019   NA 141 06/25/2019   K 4.3 06/25/2019   CL 97 06/25/2019   CREATININE 8.83 (HH) 06/25/2019   BUN 51 (H) 06/25/2019   CO2 29 06/25/2019   TSH 2.03 06/25/2019   PSA 1.39 06/25/2019   INR 1.05 10/21/2017   HGBA1C 9.3 (H) 06/25/2019      Assessment & Plan:

## 2020-03-17 NOTE — Assessment & Plan Note (Signed)
stable overall by history and exam, recent data reviewed with pt, and pt to continue medical treatment as before,  to f/u any worsening symptoms or concerns  

## 2020-03-18 DIAGNOSIS — N186 End stage renal disease: Secondary | ICD-10-CM | POA: Diagnosis not present

## 2020-03-18 DIAGNOSIS — Z992 Dependence on renal dialysis: Secondary | ICD-10-CM | POA: Diagnosis not present

## 2020-03-18 DIAGNOSIS — N2581 Secondary hyperparathyroidism of renal origin: Secondary | ICD-10-CM | POA: Diagnosis not present

## 2020-03-21 DIAGNOSIS — N186 End stage renal disease: Secondary | ICD-10-CM | POA: Diagnosis not present

## 2020-03-21 DIAGNOSIS — Z992 Dependence on renal dialysis: Secondary | ICD-10-CM | POA: Diagnosis not present

## 2020-03-21 DIAGNOSIS — N2581 Secondary hyperparathyroidism of renal origin: Secondary | ICD-10-CM | POA: Diagnosis not present

## 2020-03-21 DIAGNOSIS — Z0189 Encounter for other specified special examinations: Secondary | ICD-10-CM | POA: Diagnosis not present

## 2020-03-23 DIAGNOSIS — N2581 Secondary hyperparathyroidism of renal origin: Secondary | ICD-10-CM | POA: Diagnosis not present

## 2020-03-23 DIAGNOSIS — R202 Paresthesia of skin: Secondary | ICD-10-CM | POA: Diagnosis not present

## 2020-03-23 DIAGNOSIS — Z992 Dependence on renal dialysis: Secondary | ICD-10-CM | POA: Diagnosis not present

## 2020-03-23 DIAGNOSIS — N186 End stage renal disease: Secondary | ICD-10-CM | POA: Diagnosis not present

## 2020-03-23 DIAGNOSIS — N189 Chronic kidney disease, unspecified: Secondary | ICD-10-CM | POA: Diagnosis not present

## 2020-03-23 DIAGNOSIS — G2581 Restless legs syndrome: Secondary | ICD-10-CM | POA: Diagnosis not present

## 2020-03-24 ENCOUNTER — Ambulatory Visit: Payer: Federal, State, Local not specified - PPO | Admitting: Gastroenterology

## 2020-03-25 ENCOUNTER — Encounter: Payer: Self-pay | Admitting: Neurology

## 2020-03-25 DIAGNOSIS — Z992 Dependence on renal dialysis: Secondary | ICD-10-CM | POA: Diagnosis not present

## 2020-03-25 DIAGNOSIS — N186 End stage renal disease: Secondary | ICD-10-CM | POA: Diagnosis not present

## 2020-03-25 DIAGNOSIS — N2581 Secondary hyperparathyroidism of renal origin: Secondary | ICD-10-CM | POA: Diagnosis not present

## 2020-03-28 DIAGNOSIS — N2581 Secondary hyperparathyroidism of renal origin: Secondary | ICD-10-CM | POA: Diagnosis not present

## 2020-03-28 DIAGNOSIS — Z992 Dependence on renal dialysis: Secondary | ICD-10-CM | POA: Diagnosis not present

## 2020-03-28 DIAGNOSIS — N186 End stage renal disease: Secondary | ICD-10-CM | POA: Diagnosis not present

## 2020-03-30 DIAGNOSIS — Z992 Dependence on renal dialysis: Secondary | ICD-10-CM | POA: Diagnosis not present

## 2020-03-30 DIAGNOSIS — N2581 Secondary hyperparathyroidism of renal origin: Secondary | ICD-10-CM | POA: Diagnosis not present

## 2020-03-30 DIAGNOSIS — N186 End stage renal disease: Secondary | ICD-10-CM | POA: Diagnosis not present

## 2020-03-31 ENCOUNTER — Ambulatory Visit: Payer: Federal, State, Local not specified - PPO | Admitting: Physician Assistant

## 2020-04-01 DIAGNOSIS — N186 End stage renal disease: Secondary | ICD-10-CM | POA: Diagnosis not present

## 2020-04-01 DIAGNOSIS — Z992 Dependence on renal dialysis: Secondary | ICD-10-CM | POA: Diagnosis not present

## 2020-04-01 DIAGNOSIS — N2581 Secondary hyperparathyroidism of renal origin: Secondary | ICD-10-CM | POA: Diagnosis not present

## 2020-04-04 DIAGNOSIS — N186 End stage renal disease: Secondary | ICD-10-CM | POA: Diagnosis not present

## 2020-04-04 DIAGNOSIS — Z992 Dependence on renal dialysis: Secondary | ICD-10-CM | POA: Diagnosis not present

## 2020-04-04 DIAGNOSIS — N2581 Secondary hyperparathyroidism of renal origin: Secondary | ICD-10-CM | POA: Diagnosis not present

## 2020-04-06 DIAGNOSIS — N186 End stage renal disease: Secondary | ICD-10-CM | POA: Diagnosis not present

## 2020-04-06 DIAGNOSIS — Z992 Dependence on renal dialysis: Secondary | ICD-10-CM | POA: Diagnosis not present

## 2020-04-06 DIAGNOSIS — N2581 Secondary hyperparathyroidism of renal origin: Secondary | ICD-10-CM | POA: Diagnosis not present

## 2020-04-08 DIAGNOSIS — Z992 Dependence on renal dialysis: Secondary | ICD-10-CM | POA: Diagnosis not present

## 2020-04-08 DIAGNOSIS — N2581 Secondary hyperparathyroidism of renal origin: Secondary | ICD-10-CM | POA: Diagnosis not present

## 2020-04-08 DIAGNOSIS — N186 End stage renal disease: Secondary | ICD-10-CM | POA: Diagnosis not present

## 2020-04-11 DIAGNOSIS — N2581 Secondary hyperparathyroidism of renal origin: Secondary | ICD-10-CM | POA: Diagnosis not present

## 2020-04-11 DIAGNOSIS — Z992 Dependence on renal dialysis: Secondary | ICD-10-CM | POA: Diagnosis not present

## 2020-04-11 DIAGNOSIS — N186 End stage renal disease: Secondary | ICD-10-CM | POA: Diagnosis not present

## 2020-04-12 DIAGNOSIS — N186 End stage renal disease: Secondary | ICD-10-CM | POA: Diagnosis not present

## 2020-04-12 DIAGNOSIS — Z992 Dependence on renal dialysis: Secondary | ICD-10-CM | POA: Diagnosis not present

## 2020-04-12 DIAGNOSIS — F4321 Adjustment disorder with depressed mood: Secondary | ICD-10-CM | POA: Diagnosis not present

## 2020-04-12 DIAGNOSIS — E1022 Type 1 diabetes mellitus with diabetic chronic kidney disease: Secondary | ICD-10-CM | POA: Diagnosis not present

## 2020-04-13 DIAGNOSIS — N186 End stage renal disease: Secondary | ICD-10-CM | POA: Diagnosis not present

## 2020-04-13 DIAGNOSIS — N2581 Secondary hyperparathyroidism of renal origin: Secondary | ICD-10-CM | POA: Diagnosis not present

## 2020-04-13 DIAGNOSIS — Z992 Dependence on renal dialysis: Secondary | ICD-10-CM | POA: Diagnosis not present

## 2020-04-15 DIAGNOSIS — N2581 Secondary hyperparathyroidism of renal origin: Secondary | ICD-10-CM | POA: Diagnosis not present

## 2020-04-15 DIAGNOSIS — N186 End stage renal disease: Secondary | ICD-10-CM | POA: Diagnosis not present

## 2020-04-15 DIAGNOSIS — Z992 Dependence on renal dialysis: Secondary | ICD-10-CM | POA: Diagnosis not present

## 2020-04-18 DIAGNOSIS — N186 End stage renal disease: Secondary | ICD-10-CM | POA: Diagnosis not present

## 2020-04-18 DIAGNOSIS — N2581 Secondary hyperparathyroidism of renal origin: Secondary | ICD-10-CM | POA: Diagnosis not present

## 2020-04-18 DIAGNOSIS — Z992 Dependence on renal dialysis: Secondary | ICD-10-CM | POA: Diagnosis not present

## 2020-04-20 DIAGNOSIS — N186 End stage renal disease: Secondary | ICD-10-CM | POA: Diagnosis not present

## 2020-04-20 DIAGNOSIS — N2581 Secondary hyperparathyroidism of renal origin: Secondary | ICD-10-CM | POA: Diagnosis not present

## 2020-04-20 DIAGNOSIS — Z992 Dependence on renal dialysis: Secondary | ICD-10-CM | POA: Diagnosis not present

## 2020-04-22 DIAGNOSIS — N2581 Secondary hyperparathyroidism of renal origin: Secondary | ICD-10-CM | POA: Diagnosis not present

## 2020-04-22 DIAGNOSIS — N186 End stage renal disease: Secondary | ICD-10-CM | POA: Diagnosis not present

## 2020-04-22 DIAGNOSIS — Z992 Dependence on renal dialysis: Secondary | ICD-10-CM | POA: Diagnosis not present

## 2020-04-25 DIAGNOSIS — N2581 Secondary hyperparathyroidism of renal origin: Secondary | ICD-10-CM | POA: Diagnosis not present

## 2020-04-25 DIAGNOSIS — Z992 Dependence on renal dialysis: Secondary | ICD-10-CM | POA: Diagnosis not present

## 2020-04-25 DIAGNOSIS — N186 End stage renal disease: Secondary | ICD-10-CM | POA: Diagnosis not present

## 2020-04-27 DIAGNOSIS — N2581 Secondary hyperparathyroidism of renal origin: Secondary | ICD-10-CM | POA: Diagnosis not present

## 2020-04-27 DIAGNOSIS — Z992 Dependence on renal dialysis: Secondary | ICD-10-CM | POA: Diagnosis not present

## 2020-04-27 DIAGNOSIS — N186 End stage renal disease: Secondary | ICD-10-CM | POA: Diagnosis not present

## 2020-04-29 DIAGNOSIS — N2581 Secondary hyperparathyroidism of renal origin: Secondary | ICD-10-CM | POA: Diagnosis not present

## 2020-04-29 DIAGNOSIS — N186 End stage renal disease: Secondary | ICD-10-CM | POA: Diagnosis not present

## 2020-04-29 DIAGNOSIS — Z992 Dependence on renal dialysis: Secondary | ICD-10-CM | POA: Diagnosis not present

## 2020-05-02 DIAGNOSIS — N186 End stage renal disease: Secondary | ICD-10-CM | POA: Diagnosis not present

## 2020-05-02 DIAGNOSIS — R519 Headache, unspecified: Secondary | ICD-10-CM | POA: Diagnosis not present

## 2020-05-02 DIAGNOSIS — N2581 Secondary hyperparathyroidism of renal origin: Secondary | ICD-10-CM | POA: Diagnosis not present

## 2020-05-02 DIAGNOSIS — Z992 Dependence on renal dialysis: Secondary | ICD-10-CM | POA: Diagnosis not present

## 2020-05-03 DIAGNOSIS — F4321 Adjustment disorder with depressed mood: Secondary | ICD-10-CM | POA: Diagnosis not present

## 2020-05-04 DIAGNOSIS — R519 Headache, unspecified: Secondary | ICD-10-CM | POA: Diagnosis not present

## 2020-05-04 DIAGNOSIS — Z992 Dependence on renal dialysis: Secondary | ICD-10-CM | POA: Diagnosis not present

## 2020-05-04 DIAGNOSIS — N2581 Secondary hyperparathyroidism of renal origin: Secondary | ICD-10-CM | POA: Diagnosis not present

## 2020-05-04 DIAGNOSIS — N186 End stage renal disease: Secondary | ICD-10-CM | POA: Diagnosis not present

## 2020-05-06 DIAGNOSIS — N186 End stage renal disease: Secondary | ICD-10-CM | POA: Diagnosis not present

## 2020-05-06 DIAGNOSIS — R519 Headache, unspecified: Secondary | ICD-10-CM | POA: Diagnosis not present

## 2020-05-06 DIAGNOSIS — N2581 Secondary hyperparathyroidism of renal origin: Secondary | ICD-10-CM | POA: Diagnosis not present

## 2020-05-06 DIAGNOSIS — Z992 Dependence on renal dialysis: Secondary | ICD-10-CM | POA: Diagnosis not present

## 2020-05-09 DIAGNOSIS — N186 End stage renal disease: Secondary | ICD-10-CM | POA: Diagnosis not present

## 2020-05-09 DIAGNOSIS — N2581 Secondary hyperparathyroidism of renal origin: Secondary | ICD-10-CM | POA: Diagnosis not present

## 2020-05-09 DIAGNOSIS — Z992 Dependence on renal dialysis: Secondary | ICD-10-CM | POA: Diagnosis not present

## 2020-05-09 DIAGNOSIS — R52 Pain, unspecified: Secondary | ICD-10-CM | POA: Diagnosis not present

## 2020-05-11 DIAGNOSIS — N2581 Secondary hyperparathyroidism of renal origin: Secondary | ICD-10-CM | POA: Diagnosis not present

## 2020-05-11 DIAGNOSIS — Z992 Dependence on renal dialysis: Secondary | ICD-10-CM | POA: Diagnosis not present

## 2020-05-11 DIAGNOSIS — N186 End stage renal disease: Secondary | ICD-10-CM | POA: Diagnosis not present

## 2020-05-11 DIAGNOSIS — R52 Pain, unspecified: Secondary | ICD-10-CM | POA: Diagnosis not present

## 2020-05-12 DIAGNOSIS — Z992 Dependence on renal dialysis: Secondary | ICD-10-CM | POA: Diagnosis not present

## 2020-05-12 DIAGNOSIS — E1022 Type 1 diabetes mellitus with diabetic chronic kidney disease: Secondary | ICD-10-CM | POA: Diagnosis not present

## 2020-05-12 DIAGNOSIS — N186 End stage renal disease: Secondary | ICD-10-CM | POA: Diagnosis not present

## 2020-05-13 DIAGNOSIS — Z992 Dependence on renal dialysis: Secondary | ICD-10-CM | POA: Diagnosis not present

## 2020-05-13 DIAGNOSIS — N2581 Secondary hyperparathyroidism of renal origin: Secondary | ICD-10-CM | POA: Diagnosis not present

## 2020-05-13 DIAGNOSIS — N186 End stage renal disease: Secondary | ICD-10-CM | POA: Diagnosis not present

## 2020-05-16 DIAGNOSIS — N2581 Secondary hyperparathyroidism of renal origin: Secondary | ICD-10-CM | POA: Diagnosis not present

## 2020-05-16 DIAGNOSIS — Z992 Dependence on renal dialysis: Secondary | ICD-10-CM | POA: Diagnosis not present

## 2020-05-16 DIAGNOSIS — N186 End stage renal disease: Secondary | ICD-10-CM | POA: Diagnosis not present

## 2020-05-17 DIAGNOSIS — N186 End stage renal disease: Secondary | ICD-10-CM | POA: Diagnosis not present

## 2020-05-17 DIAGNOSIS — I871 Compression of vein: Secondary | ICD-10-CM | POA: Diagnosis not present

## 2020-05-17 DIAGNOSIS — Z992 Dependence on renal dialysis: Secondary | ICD-10-CM | POA: Diagnosis not present

## 2020-05-17 DIAGNOSIS — T82858A Stenosis of vascular prosthetic devices, implants and grafts, initial encounter: Secondary | ICD-10-CM | POA: Diagnosis not present

## 2020-05-18 DIAGNOSIS — N186 End stage renal disease: Secondary | ICD-10-CM | POA: Diagnosis not present

## 2020-05-18 DIAGNOSIS — N2581 Secondary hyperparathyroidism of renal origin: Secondary | ICD-10-CM | POA: Diagnosis not present

## 2020-05-18 DIAGNOSIS — Z992 Dependence on renal dialysis: Secondary | ICD-10-CM | POA: Diagnosis not present

## 2020-05-20 DIAGNOSIS — N2581 Secondary hyperparathyroidism of renal origin: Secondary | ICD-10-CM | POA: Diagnosis not present

## 2020-05-20 DIAGNOSIS — N186 End stage renal disease: Secondary | ICD-10-CM | POA: Diagnosis not present

## 2020-05-20 DIAGNOSIS — Z992 Dependence on renal dialysis: Secondary | ICD-10-CM | POA: Diagnosis not present

## 2020-05-21 ENCOUNTER — Telehealth (INDEPENDENT_AMBULATORY_CARE_PROVIDER_SITE_OTHER): Payer: Federal, State, Local not specified - PPO | Admitting: Family Medicine

## 2020-05-21 ENCOUNTER — Encounter: Payer: Self-pay | Admitting: Family Medicine

## 2020-05-21 ENCOUNTER — Other Ambulatory Visit: Payer: Self-pay

## 2020-05-21 VITALS — BP 134/78 | Temp 97.5°F

## 2020-05-21 DIAGNOSIS — J0100 Acute maxillary sinusitis, unspecified: Secondary | ICD-10-CM

## 2020-05-21 DIAGNOSIS — J309 Allergic rhinitis, unspecified: Secondary | ICD-10-CM | POA: Diagnosis not present

## 2020-05-21 MED ORDER — AMOXICILLIN-POT CLAVULANATE 500-125 MG PO TABS: ORAL_TABLET | ORAL | 0 refills | Status: DC

## 2020-05-21 MED ORDER — TRIAMCINOLONE ACETONIDE 55 MCG/ACT NA AERO: 2.0000 | INHALATION_SPRAY | Freq: Every day | NASAL | 0 refills | Status: AC

## 2020-05-21 NOTE — Progress Notes (Signed)
Virtual Visit via Video Note  I connected with pt on 05/21/20 at 10:40 AM EDT by a video enabled telemedicine application and verified that I am speaking with the correct person using two identifiers.  Location patient: home Location provider:work or home office Persons participating in the virtual visit: patient, provider  I discussed the limitations of evaluation and management by telemedicine and the availability of in person appointments. The patient expressed understanding and agreed to proceed.  Telemedicine visit is a necessity given the COVID-19 restrictions in place at the current time.  HPI: 49 y/o AAM with DM I, HTN, HLD, and ESRD currently receiving hemodialysis on M/W/F who is being seen today for "face pain and sinus drainage". He has chronic allergic rhinitis. Starting a few months ago, noting thick PND, PND cough that sometimes caused gagging, raspy voice. Cough the worst in morning but still ongoing through the day.  No SOB. Last 2-3 weeks noting worsening sx's:  pressure in forehead, pain in maxillary sinuses, more pressure in face/head in general.  Pain in upper AND lower teeth region diffusely.  +feels tenderness to touch in maxillary sinus regions. No fever. Caused migraine-type HA yesterday. Takes allegra daily.  No nasal spray--says he doesn't have nasacort listed on his med list.  Taking tylenol regularly--temporary relief.  ROS: See pertinent positives and negatives per HPI.  Past Medical History:  Diagnosis Date  . ABSCESS, TOOTH 04/23/2009  . AKI (acute kidney injury) ([place]) 08/2015  . Anemia   . CHF (congestive heart failure) ([place])   . Chronic kidney disease    dialysis M-W-F  . DIABETES MELLITUS, TYPE I 07/30/2007  . Esophageal reflux 04/27/2009  . FATIGUE 04/27/2009   Resolved per patient 06/03/19, no longer a problem  . HYPERLIPIDEMIA 04/24/2007  . HYPERTENSION 04/24/2007  . INSOMNIA-SLEEP DISORDER-UNSPEC 04/27/2009   resolved, no longer a problem per  patient 06/03/19  . LUMBAR STRAIN, ACUTE 12/11/2008  . RASH-NONVESICULAR 03/11/2008    Past Surgical History:  Procedure Laterality Date  . AV FISTULA PLACEMENT Left 06/04/2019   Procedure: BRACHIAL-CEPHALIC ARTERIOVENOUS (AV) FISTULA CREATION LEFT ARM;  Surgeon: Rosetta Posner, MD;  Location: [facility];  Service: Vascular;  Laterality: Left;  . COLONOSCOPY     polyp  . IR FLUORO GUIDE CV LINE RIGHT  10/22/2017  . IR US GUIDE VASC ACCESS RIGHT  10/22/2017  . MOUTH SURGERY     tooth ext     Current Outpatient Medications:  .  amLODipine (NORVASC) 5 MG tablet, Take 1 tablet (5 mg total) by mouth daily. (Patient taking differently: Take 5 mg by mouth at bedtime. ), Disp: 30 tablet, Rfl: 1 .  aspirin EC 81 MG EC tablet, Take 1 tablet (81 mg total) by mouth daily., Disp: 30 tablet, Rfl: 0 .  calcitRIOL (ROCALTROL) 0.25 MCG capsule, Take 1 capsule (0.25 mcg total) by mouth every Monday, Wednesday, and Friday at 6 PM., Disp: 15 capsule, Rfl: 0 .  calcium acetate (PHOSLO) 667 MG capsule, Take 2 capsules (1,334 mg total) by mouth 3 (three) times daily with meals. (Patient taking differently: Take 2,001 mg by mouth 3 (three) times daily with meals. ), Disp: 180 capsule, Rfl: 3 .  cinacalcet (SENSIPAR) 30 MG tablet, Take 30 mg by mouth every evening. , Disp: , Rfl:  .  fexofenadine (ALLEGRA) 180 MG tablet, Take 1 tablet (180 mg total) by mouth daily., Disp: 90 tablet, Rfl: 3 .  gabapentin (NEURONTIN) 300 MG capsule, 1-2 tab by mouth at bedtime as  needed, Disp: 180 capsule, Rfl: 1 .  glycopyrrolate (ROBINUL) 2 MG tablet, Take 1 tablet (2 mg total) by mouth 2 (two) times daily., Disp: 60 tablet, Rfl: 11 .  insulin aspart (NOVOLOG) 100 UNIT/ML injection, Inject 8-12 Units into the skin 3 (three) times daily with meals., Disp: 10 mL, Rfl: 11 .  insulin glargine (LANTUS) 100 UNIT/ML injection, Inject 0.25 mLs (25 Units total) into the skin at bedtime., Disp: 20 mL, Rfl: 5 .  Methoxy PEG-Epoetin Beta (MIRCERA IJ),  Mircera, Disp: , Rfl:  .  omeprazole (PRILOSEC) 40 MG capsule, Take 1 capsule (40 mg total) by mouth 2 (two) times daily., Disp: 60 capsule, Rfl: 11 .  rosuvastatin (CRESTOR) 10 MG tablet, Take 1 tablet (10 mg total) by mouth daily., Disp: 30 tablet, Rfl: 3 .  VITAMIN D PO, Take by mouth., Disp: , Rfl:  .  meclizine (ANTIVERT) 12.5 MG tablet, Take 1 tablet (12.5 mg total) by mouth 3 (three) times daily as needed for dizziness. (Patient not taking: Reported on 05/21/2020), Disp: 40 tablet, Rfl: 2 .  triamcinolone (NASACORT) 55 MCG/ACT AERO nasal inhaler, Place 2 sprays into the nose daily. (Patient not taking: Reported on 05/21/2020), Disp: 1 each, Rfl: 12 .  Tuberculin PPD (TUBERSOL ID), Inject into the skin. (Patient not taking: Reported on 05/21/2020), Disp: , Rfl:   EXAM:  VITALS per patient if applicable:  Vitals with BMI 05/21/2020 03/17/2020 12/10/2019  Height - - 5\' 6"   Weight - 202 lbs 6 oz 202 lbs  BMI - - 64.68  Systolic 032 122 482  Diastolic 78 90 82  Pulse - 104 100     GENERAL: alert, oriented, appears well and in no acute distress  HEENT: atraumatic, conjunttiva clear, no obvious abnormalities on inspection of external nose and ears  NECK: normal movements of the head and neck  LUNGS: on inspection no signs of respiratory distress, breathing rate appears normal, no obvious gross SOB, gasping or wheezing  CV: no obvious cyanosis  MS: moves all visible extremities without noticeable abnormality  PSYCH/NEURO: pleasant and cooperative, no obvious depression or anxiety, speech and thought processing grossly intact  LABS: none today  Lab Results  Component Value Date   WBC 8.2 06/25/2019   HGB 12.3 (L) 06/25/2019   HCT 37.5 (L) 06/25/2019   MCV 83.4 06/25/2019   PLT 261.0 06/25/2019   Lab Results  Component Value Date   HGBA1C 9.3 (H) 06/25/2019   ASSESSMENT AND PLAN:  Discussed the following assessment and plan:  Allergic rhinitis with acute sinusitis. Will  avoid steroids at this time but will treat with augmentin 500/125, 1 bid x 10d. I'll send in nasacort rx and recommended saline nasal spray as well. Recommended trial of mucinex as well.  -we discussed possible serious and likely etiologies, options for evaluation and workup, limitations of telemedicine visit vs in person visit, treatment, treatment risks and precautions. Pt prefers to treat via telemedicine empirically rather than in person at this moment.     I discussed the assessment and treatment plan with the patient. The patient was provided an opportunity to ask questions and all were answered. The patient agreed with the plan and demonstrated an understanding of the instructions.    F/u: if not improving signif in 3-4d  Signed:  Crissie Sickles, MD           05/21/2020

## 2020-05-23 DIAGNOSIS — N186 End stage renal disease: Secondary | ICD-10-CM | POA: Diagnosis not present

## 2020-05-23 DIAGNOSIS — N2581 Secondary hyperparathyroidism of renal origin: Secondary | ICD-10-CM | POA: Diagnosis not present

## 2020-05-23 DIAGNOSIS — Z992 Dependence on renal dialysis: Secondary | ICD-10-CM | POA: Diagnosis not present

## 2020-05-24 DIAGNOSIS — Z992 Dependence on renal dialysis: Secondary | ICD-10-CM | POA: Diagnosis not present

## 2020-05-24 DIAGNOSIS — N186 End stage renal disease: Secondary | ICD-10-CM | POA: Diagnosis not present

## 2020-05-24 DIAGNOSIS — N2581 Secondary hyperparathyroidism of renal origin: Secondary | ICD-10-CM | POA: Diagnosis not present

## 2020-05-25 DIAGNOSIS — F4321 Adjustment disorder with depressed mood: Secondary | ICD-10-CM | POA: Diagnosis not present

## 2020-05-26 DIAGNOSIS — N2581 Secondary hyperparathyroidism of renal origin: Secondary | ICD-10-CM | POA: Diagnosis not present

## 2020-05-26 DIAGNOSIS — Z992 Dependence on renal dialysis: Secondary | ICD-10-CM | POA: Diagnosis not present

## 2020-05-26 DIAGNOSIS — N186 End stage renal disease: Secondary | ICD-10-CM | POA: Diagnosis not present

## 2020-05-27 DIAGNOSIS — N2581 Secondary hyperparathyroidism of renal origin: Secondary | ICD-10-CM | POA: Diagnosis not present

## 2020-05-27 DIAGNOSIS — N186 End stage renal disease: Secondary | ICD-10-CM | POA: Diagnosis not present

## 2020-05-27 DIAGNOSIS — Z992 Dependence on renal dialysis: Secondary | ICD-10-CM | POA: Diagnosis not present

## 2020-05-28 ENCOUNTER — Other Ambulatory Visit: Payer: Self-pay | Admitting: Internal Medicine

## 2020-05-28 ENCOUNTER — Other Ambulatory Visit: Payer: Self-pay | Admitting: Family Medicine

## 2020-05-30 DIAGNOSIS — Z992 Dependence on renal dialysis: Secondary | ICD-10-CM | POA: Diagnosis not present

## 2020-05-30 DIAGNOSIS — N186 End stage renal disease: Secondary | ICD-10-CM | POA: Diagnosis not present

## 2020-05-30 DIAGNOSIS — Z23 Encounter for immunization: Secondary | ICD-10-CM | POA: Diagnosis not present

## 2020-05-30 DIAGNOSIS — N2581 Secondary hyperparathyroidism of renal origin: Secondary | ICD-10-CM | POA: Diagnosis not present

## 2020-05-31 DIAGNOSIS — Z23 Encounter for immunization: Secondary | ICD-10-CM | POA: Diagnosis not present

## 2020-05-31 DIAGNOSIS — Z992 Dependence on renal dialysis: Secondary | ICD-10-CM | POA: Diagnosis not present

## 2020-05-31 DIAGNOSIS — N2581 Secondary hyperparathyroidism of renal origin: Secondary | ICD-10-CM | POA: Diagnosis not present

## 2020-05-31 DIAGNOSIS — N186 End stage renal disease: Secondary | ICD-10-CM | POA: Diagnosis not present

## 2020-06-02 ENCOUNTER — Other Ambulatory Visit: Payer: Self-pay | Admitting: Internal Medicine

## 2020-06-02 DIAGNOSIS — N2581 Secondary hyperparathyroidism of renal origin: Secondary | ICD-10-CM | POA: Diagnosis not present

## 2020-06-02 DIAGNOSIS — N186 End stage renal disease: Secondary | ICD-10-CM | POA: Diagnosis not present

## 2020-06-02 DIAGNOSIS — Z992 Dependence on renal dialysis: Secondary | ICD-10-CM | POA: Diagnosis not present

## 2020-06-02 DIAGNOSIS — Z23 Encounter for immunization: Secondary | ICD-10-CM | POA: Diagnosis not present

## 2020-06-03 DIAGNOSIS — N186 End stage renal disease: Secondary | ICD-10-CM | POA: Diagnosis not present

## 2020-06-03 DIAGNOSIS — Z992 Dependence on renal dialysis: Secondary | ICD-10-CM | POA: Diagnosis not present

## 2020-06-03 DIAGNOSIS — Z23 Encounter for immunization: Secondary | ICD-10-CM | POA: Diagnosis not present

## 2020-06-03 DIAGNOSIS — N2581 Secondary hyperparathyroidism of renal origin: Secondary | ICD-10-CM | POA: Diagnosis not present

## 2020-06-06 DIAGNOSIS — Z992 Dependence on renal dialysis: Secondary | ICD-10-CM | POA: Diagnosis not present

## 2020-06-06 DIAGNOSIS — N186 End stage renal disease: Secondary | ICD-10-CM | POA: Diagnosis not present

## 2020-06-06 DIAGNOSIS — N2581 Secondary hyperparathyroidism of renal origin: Secondary | ICD-10-CM | POA: Diagnosis not present

## 2020-06-07 DIAGNOSIS — N186 End stage renal disease: Secondary | ICD-10-CM | POA: Diagnosis not present

## 2020-06-07 DIAGNOSIS — N2581 Secondary hyperparathyroidism of renal origin: Secondary | ICD-10-CM | POA: Diagnosis not present

## 2020-06-07 DIAGNOSIS — Z992 Dependence on renal dialysis: Secondary | ICD-10-CM | POA: Diagnosis not present

## 2020-06-09 DIAGNOSIS — N2581 Secondary hyperparathyroidism of renal origin: Secondary | ICD-10-CM | POA: Diagnosis not present

## 2020-06-09 DIAGNOSIS — N186 End stage renal disease: Secondary | ICD-10-CM | POA: Diagnosis not present

## 2020-06-09 DIAGNOSIS — Z992 Dependence on renal dialysis: Secondary | ICD-10-CM | POA: Diagnosis not present

## 2020-06-10 DIAGNOSIS — N2581 Secondary hyperparathyroidism of renal origin: Secondary | ICD-10-CM | POA: Diagnosis not present

## 2020-06-10 DIAGNOSIS — Z992 Dependence on renal dialysis: Secondary | ICD-10-CM | POA: Diagnosis not present

## 2020-06-10 DIAGNOSIS — N186 End stage renal disease: Secondary | ICD-10-CM | POA: Diagnosis not present

## 2020-06-12 DIAGNOSIS — E1022 Type 1 diabetes mellitus with diabetic chronic kidney disease: Secondary | ICD-10-CM | POA: Diagnosis not present

## 2020-06-12 DIAGNOSIS — N186 End stage renal disease: Secondary | ICD-10-CM | POA: Diagnosis not present

## 2020-06-12 DIAGNOSIS — Z992 Dependence on renal dialysis: Secondary | ICD-10-CM | POA: Diagnosis not present

## 2020-06-13 ENCOUNTER — Encounter: Payer: Self-pay | Admitting: Neurology

## 2020-06-13 ENCOUNTER — Ambulatory Visit (INDEPENDENT_AMBULATORY_CARE_PROVIDER_SITE_OTHER): Payer: Federal, State, Local not specified - PPO | Admitting: Neurology

## 2020-06-13 ENCOUNTER — Other Ambulatory Visit: Payer: Self-pay

## 2020-06-13 VITALS — BP 176/117 | HR 93 | Ht 66.0 in | Wt 217.0 lb

## 2020-06-13 DIAGNOSIS — G253 Myoclonus: Secondary | ICD-10-CM

## 2020-06-13 DIAGNOSIS — E1042 Type 1 diabetes mellitus with diabetic polyneuropathy: Secondary | ICD-10-CM | POA: Diagnosis not present

## 2020-06-13 DIAGNOSIS — Z992 Dependence on renal dialysis: Secondary | ICD-10-CM | POA: Diagnosis not present

## 2020-06-13 DIAGNOSIS — N186 End stage renal disease: Secondary | ICD-10-CM | POA: Diagnosis not present

## 2020-06-13 DIAGNOSIS — N2581 Secondary hyperparathyroidism of renal origin: Secondary | ICD-10-CM | POA: Diagnosis not present

## 2020-06-13 MED ORDER — LEVETIRACETAM 250 MG PO TABS: 250.0000 mg | ORAL_TABLET | Freq: Every day | ORAL | 5 refills | Status: DC

## 2020-06-13 NOTE — Patient Instructions (Signed)
Start keppra 250mg  at bedtime for myoclonic jerks  Call with an update in 1 month  Check feet daily  Return to clinic in 3 months

## 2020-06-13 NOTE — Progress Notes (Signed)
[facility] Clinic Note - Initial Visit   Date: 06/13/20  Hector Mahoney MRN: [id number] DOB: 1971-04-12   Dear Shanon Rosser, PA:  Thank you for your kind referral of Hector Mahoney for consultation of abnormal movements. Although his history is well known to you, please allow [place] to reiterate it for the purpose of our medical record. The patient was accompanied to the clinic by self.    History of Present Illness: Hector Mahoney is a 49 y.o. right-handed male with poorly controlled diabetes mellitus, hypertension, ESRD on HD, hyperlipidemia, hypertension, and CHF presenting for evaluation of abnormal limb movements.  For the past 63-months, he has noticed different sensation of the skin and muscle jerking.  When he is at rest, such as resting in bed, he has uncontrollable episodic jerking of the arms or the legs.  The movements are very brief and vary in location, but the entire leg may kick or the arm may fling.  There is no repetitive movement, loss of consciousness, or weakness. Symptoms have not progressed.  When severe it may last several hours to all night long. He has noticed that it tends to occur after dialysis.  He is currently training to do home hemodialysis four times per week, 3h 20 min, as compared to three times per week when he was at the center.  His movements are not as intense since making this transition.    Out-side paper records, electronic medical record, and images have been reviewed where available and summarized as:   Lab Results  Component Value Date   FERRITIN 667.5 (H) 06/25/2019    Lab Results  Component Value Date   HGBA1C 9.3 (H) 06/25/2019   Lab Results  Component Value Date   VITAMINB12 >1500 (H) 06/25/2019   Lab Results  Component Value Date   TSH 2.03 06/25/2019   No results found for: ESRSEDRATE, POCTSEDRATE  Past Medical History:  Diagnosis Date  . ABSCESS, TOOTH 04/23/2009  . AKI (acute kidney  injury) ([place]) 08/2015  . Anemia   . CHF (congestive heart failure) ([place])   . Chronic kidney disease    dialysis M-W-F  . DIABETES MELLITUS, TYPE I 07/30/2007  . Esophageal reflux 04/27/2009  . FATIGUE 04/27/2009   Resolved per patient 06/03/19, no longer a problem  . HYPERLIPIDEMIA 04/24/2007  . HYPERTENSION 04/24/2007  . INSOMNIA-SLEEP DISORDER-UNSPEC 04/27/2009   resolved, no longer a problem per patient 06/03/19  . LUMBAR STRAIN, ACUTE 12/11/2008  . RASH-NONVESICULAR 03/11/2008    Past Surgical History:  Procedure Laterality Date  . AV FISTULA PLACEMENT Left 06/04/2019   Procedure: BRACHIAL-CEPHALIC ARTERIOVENOUS (AV) FISTULA CREATION LEFT ARM;  Surgeon: Rosetta Posner, MD;  Location: [facility] OR;  Service: Vascular;  Laterality: Left;  . COLONOSCOPY     polyp  . IR FLUORO GUIDE CV LINE RIGHT  10/22/2017  . IR US GUIDE VASC ACCESS RIGHT  10/22/2017  . MOUTH SURGERY     tooth ext     Medications:  Outpatient Encounter Medications as of 06/13/2020  Medication Sig  . amLODipine (NORVASC) 5 MG tablet Take 1 tablet (5 mg total) by mouth daily. (Patient taking differently: Take 5 mg by mouth at bedtime. )  . aspirin EC 81 MG EC tablet Take 1 tablet (81 mg total) by mouth daily.  . calcitRIOL (ROCALTROL) 0.25 MCG capsule Take 1 capsule (0.25 mcg total) by mouth every Monday, Wednesday, and Friday at 6 PM.  . calcium acetate (PHOSLO) 667 MG  capsule Take 2 capsules (1,334 mg total) by mouth 3 (three) times daily with meals. (Patient taking differently: Take 2,001 mg by mouth 3 (three) times daily with meals. )  . cetirizine (ZYRTEC) 10 MG tablet Take 1 tablet by mouth once daily  . cinacalcet (SENSIPAR) 30 MG tablet Take 30 mg by mouth every evening.   . fexofenadine (ALLEGRA) 180 MG tablet Take 1 tablet (180 mg total) by mouth daily.  Marland Kitchen gabapentin (NEURONTIN) 300 MG capsule 1-2 tab by mouth at bedtime as needed  . glycopyrrolate (ROBINUL) 2 MG tablet Take 1 tablet (2 mg total) by mouth 2 (two)  times daily.  . insulin aspart (NOVOLOG) 100 UNIT/ML injection Inject 8-12 Units into the skin 3 (three) times daily with meals.  . insulin glargine (LANTUS) 100 UNIT/ML injection Inject 0.25 mLs (25 Units total) into the skin at bedtime.  . meclizine (ANTIVERT) 12.5 MG tablet TAKE 1 TABLET BY MOUTH THREE TIMES DAILY AS NEEDED FOR DIZZINESS  . Methoxy PEG-Epoetin Beta (MIRCERA IJ) Mircera  . omeprazole (PRILOSEC) 40 MG capsule Take 1 capsule (40 mg total) by mouth 2 (two) times daily.  . rosuvastatin (CRESTOR) 10 MG tablet Take 1 tablet (10 mg total) by mouth daily.  Marland Kitchen triamcinolone (NASACORT) 55 MCG/ACT AERO nasal inhaler Place 2 sprays into the nose daily.  . Tuberculin PPD (TUBERSOL ID) Inject into the skin.   Marland Kitchen VITAMIN D PO Take by mouth.  Marland Kitchen amoxicillin-clavulanate (AUGMENTIN) 500-125 MG tablet 1 tab po bid for 10 days (Patient not taking: Reported on 06/13/2020)   No facility-administered encounter medications on file as of 06/13/2020.    Allergies: No Known Allergies  Family History: Family History  Problem Relation Age of Onset  . Hypertension Mother   . Diabetes Mother   . Diabetes Maternal Aunt   . Lung cancer Maternal Grandfather   . Colon polyps Maternal Uncle   . Colon cancer Maternal Uncle   . Heart disease Maternal Uncle   . Kidney disease Neg Hx   . Esophageal cancer Neg Hx   . Rectal cancer Neg Hx   . Stomach cancer Neg Hx     Social History: Social History   Tobacco Use  . Smoking status: Former Smoker    Types: Cigarettes    Quit date: 05/03/2015    Years since quitting: 5.1  . Smokeless tobacco: Never Used  . Tobacco comment: 12/22/2012 "used to smoke cigarettes once or twice/month; haven't had any cigarettes for years"  Vaping Use  . Vaping Use: Never used  Substance Use Topics  . Alcohol use: Yes    Comment: occasional   . Drug use: No   Social History   Social History Narrative   Right Handed   Lives in a one story home   Drinks little caffeine      Vital Signs:  BP (!) 176/117   Pulse 93   Ht 5\' 6"  (1.676 m)   Wt 217 lb (98.4 kg)   SpO2 99%   BMI 35.02 kg/m   Neurological Exam: MENTAL STATUS including orientation to time, place, person, recent and remote memory, attention span and concentration, language, and fund of knowledge is normal.  Speech is not dysarthric.  CRANIAL NERVES: II:  No visual field defects.   III-IV-VI: Pupils equal round and reactive to light.  Normal conjugate, extra-ocular eye movements in all directions of gaze.  No nystagmus.  No ptosis.   V:  Normal facial sensation.    VII:  Normal  facial symmetry and movements.   VIII:  Normal hearing and vestibular function.   IX-X:  Normal palatal movement.   XI:  Normal shoulder shrug and head rotation.   XII:  Normal tongue strength and range of motion, no deviation or fasciculation.  MOTOR:  No atrophy, fasciculations or abnormal movements.  No pronator drift.   Upper Extremity:  Right  Left  Deltoid  5/5   5/5   Biceps  5/5   5/5   Triceps  5/5   5/5   Infraspinatus 5/5  5/5  Medial pectoralis 5/5  5/5  Wrist extensors  5/5   5/5   Wrist flexors  5/5   5/5   Finger extensors  5/5   5/5   Finger flexors  5/5   5/5   Dorsal interossei  5-/5   5-/5   Abductor pollicis  5/5   5/5   Tone (Ashworth scale)  0  0   Lower Extremity:  Right  Left  Hip flexors  5/5   5/5   Hip extensors  5/5   5/5   Adductor 5/5  5/5  Abductor 5/5  5/5  Knee flexors  5/5   5/5   Knee extensors  5/5   5/5   Dorsiflexors  5/5   5/5   Plantarflexors  5/5   5/5   Toe extensors  5-/5   5-/5   Toe flexors  5/5   5/5   Tone (Ashworth scale)  0  0   MSRs:  Right        Left                  brachioradialis 2+  2+  biceps 2+  2+  triceps 2+  2+  patellar 1+  1+  ankle jerk 0  0  Hoffman no  no  plantar response down  down   SENSORY:  Vibration is absent distal to ankles bilaterally, temperature and pin prick reduced over the toes.  Sensation intact in the arms.  Romberg's sign absent.   COORDINATION/GAIT: Normal finger-to- nose-finger.  Intact rapid alternating movements bilaterally.  Able to rise from a chair without using arms.  Gait narrow based and stable. Tandem and stressed gait intact.    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 TSH is normal.  - Start keppra 250mg  at bedtime.  Titrate as needed  - Consider benzodiazepine going forward  Diabetic polyneuropathy manifesting with numbness in the feet  - Patient educated on daily foot inspection, fall prevention, and safety precautions around the home.  Return to clinic in 3 months.   Thank you for allowing me to participate in patient's care.  If I can answer any additional questions, I would be pleased to do so.    Sincerely,    Cordarryl Monrreal K. Posey Pronto, DO

## 2020-06-14 DIAGNOSIS — N186 End stage renal disease: Secondary | ICD-10-CM | POA: Diagnosis not present

## 2020-06-14 DIAGNOSIS — N2581 Secondary hyperparathyroidism of renal origin: Secondary | ICD-10-CM | POA: Diagnosis not present

## 2020-06-14 DIAGNOSIS — Z992 Dependence on renal dialysis: Secondary | ICD-10-CM | POA: Diagnosis not present

## 2020-06-16 DIAGNOSIS — N186 End stage renal disease: Secondary | ICD-10-CM | POA: Diagnosis not present

## 2020-06-16 DIAGNOSIS — N2581 Secondary hyperparathyroidism of renal origin: Secondary | ICD-10-CM | POA: Diagnosis not present

## 2020-06-16 DIAGNOSIS — Z992 Dependence on renal dialysis: Secondary | ICD-10-CM | POA: Diagnosis not present

## 2020-06-20 DIAGNOSIS — N2581 Secondary hyperparathyroidism of renal origin: Secondary | ICD-10-CM | POA: Diagnosis not present

## 2020-06-20 DIAGNOSIS — Z992 Dependence on renal dialysis: Secondary | ICD-10-CM | POA: Diagnosis not present

## 2020-06-20 DIAGNOSIS — N186 End stage renal disease: Secondary | ICD-10-CM | POA: Diagnosis not present

## 2020-06-21 DIAGNOSIS — N186 End stage renal disease: Secondary | ICD-10-CM | POA: Diagnosis not present

## 2020-06-21 DIAGNOSIS — Z992 Dependence on renal dialysis: Secondary | ICD-10-CM | POA: Diagnosis not present

## 2020-06-21 DIAGNOSIS — N2581 Secondary hyperparathyroidism of renal origin: Secondary | ICD-10-CM | POA: Diagnosis not present

## 2020-06-22 DIAGNOSIS — N186 End stage renal disease: Secondary | ICD-10-CM | POA: Diagnosis not present

## 2020-06-22 DIAGNOSIS — N2581 Secondary hyperparathyroidism of renal origin: Secondary | ICD-10-CM | POA: Diagnosis not present

## 2020-06-22 DIAGNOSIS — Z992 Dependence on renal dialysis: Secondary | ICD-10-CM | POA: Diagnosis not present

## 2020-06-24 DIAGNOSIS — Z992 Dependence on renal dialysis: Secondary | ICD-10-CM | POA: Diagnosis not present

## 2020-06-24 DIAGNOSIS — N2581 Secondary hyperparathyroidism of renal origin: Secondary | ICD-10-CM | POA: Diagnosis not present

## 2020-06-24 DIAGNOSIS — N186 End stage renal disease: Secondary | ICD-10-CM | POA: Diagnosis not present

## 2020-06-27 DIAGNOSIS — Z992 Dependence on renal dialysis: Secondary | ICD-10-CM | POA: Diagnosis not present

## 2020-06-27 DIAGNOSIS — N186 End stage renal disease: Secondary | ICD-10-CM | POA: Diagnosis not present

## 2020-06-27 DIAGNOSIS — N2581 Secondary hyperparathyroidism of renal origin: Secondary | ICD-10-CM | POA: Diagnosis not present

## 2020-06-28 DIAGNOSIS — N186 End stage renal disease: Secondary | ICD-10-CM | POA: Diagnosis not present

## 2020-06-28 DIAGNOSIS — N2581 Secondary hyperparathyroidism of renal origin: Secondary | ICD-10-CM | POA: Diagnosis not present

## 2020-06-28 DIAGNOSIS — E875 Hyperkalemia: Secondary | ICD-10-CM | POA: Diagnosis not present

## 2020-06-28 DIAGNOSIS — Z992 Dependence on renal dialysis: Secondary | ICD-10-CM | POA: Diagnosis not present

## 2020-06-30 DIAGNOSIS — E875 Hyperkalemia: Secondary | ICD-10-CM | POA: Diagnosis not present

## 2020-06-30 DIAGNOSIS — N2581 Secondary hyperparathyroidism of renal origin: Secondary | ICD-10-CM | POA: Diagnosis not present

## 2020-06-30 DIAGNOSIS — Z992 Dependence on renal dialysis: Secondary | ICD-10-CM | POA: Diagnosis not present

## 2020-06-30 DIAGNOSIS — N186 End stage renal disease: Secondary | ICD-10-CM | POA: Diagnosis not present

## 2020-07-01 DIAGNOSIS — Z992 Dependence on renal dialysis: Secondary | ICD-10-CM | POA: Diagnosis not present

## 2020-07-01 DIAGNOSIS — E875 Hyperkalemia: Secondary | ICD-10-CM | POA: Diagnosis not present

## 2020-07-01 DIAGNOSIS — N186 End stage renal disease: Secondary | ICD-10-CM | POA: Diagnosis not present

## 2020-07-01 DIAGNOSIS — N2581 Secondary hyperparathyroidism of renal origin: Secondary | ICD-10-CM | POA: Diagnosis not present

## 2020-07-04 DIAGNOSIS — N186 End stage renal disease: Secondary | ICD-10-CM | POA: Diagnosis not present

## 2020-07-04 DIAGNOSIS — Z992 Dependence on renal dialysis: Secondary | ICD-10-CM | POA: Diagnosis not present

## 2020-07-04 DIAGNOSIS — N2581 Secondary hyperparathyroidism of renal origin: Secondary | ICD-10-CM | POA: Diagnosis not present

## 2020-07-04 DIAGNOSIS — E875 Hyperkalemia: Secondary | ICD-10-CM | POA: Diagnosis not present

## 2020-07-05 DIAGNOSIS — N2581 Secondary hyperparathyroidism of renal origin: Secondary | ICD-10-CM | POA: Diagnosis not present

## 2020-07-05 DIAGNOSIS — E875 Hyperkalemia: Secondary | ICD-10-CM | POA: Diagnosis not present

## 2020-07-05 DIAGNOSIS — N186 End stage renal disease: Secondary | ICD-10-CM | POA: Diagnosis not present

## 2020-07-05 DIAGNOSIS — Z992 Dependence on renal dialysis: Secondary | ICD-10-CM | POA: Diagnosis not present

## 2020-07-07 DIAGNOSIS — E875 Hyperkalemia: Secondary | ICD-10-CM | POA: Diagnosis not present

## 2020-07-07 DIAGNOSIS — Z992 Dependence on renal dialysis: Secondary | ICD-10-CM | POA: Diagnosis not present

## 2020-07-07 DIAGNOSIS — N2581 Secondary hyperparathyroidism of renal origin: Secondary | ICD-10-CM | POA: Diagnosis not present

## 2020-07-07 DIAGNOSIS — N186 End stage renal disease: Secondary | ICD-10-CM | POA: Diagnosis not present

## 2020-07-08 DIAGNOSIS — N2581 Secondary hyperparathyroidism of renal origin: Secondary | ICD-10-CM | POA: Diagnosis not present

## 2020-07-08 DIAGNOSIS — E875 Hyperkalemia: Secondary | ICD-10-CM | POA: Diagnosis not present

## 2020-07-08 DIAGNOSIS — Z992 Dependence on renal dialysis: Secondary | ICD-10-CM | POA: Diagnosis not present

## 2020-07-08 DIAGNOSIS — N186 End stage renal disease: Secondary | ICD-10-CM | POA: Diagnosis not present

## 2020-07-11 DIAGNOSIS — E875 Hyperkalemia: Secondary | ICD-10-CM | POA: Diagnosis not present

## 2020-07-11 DIAGNOSIS — N186 End stage renal disease: Secondary | ICD-10-CM | POA: Diagnosis not present

## 2020-07-11 DIAGNOSIS — Z992 Dependence on renal dialysis: Secondary | ICD-10-CM | POA: Diagnosis not present

## 2020-07-11 DIAGNOSIS — N2581 Secondary hyperparathyroidism of renal origin: Secondary | ICD-10-CM | POA: Diagnosis not present

## 2020-07-12 DIAGNOSIS — E1022 Type 1 diabetes mellitus with diabetic chronic kidney disease: Secondary | ICD-10-CM | POA: Diagnosis not present

## 2020-07-12 DIAGNOSIS — Z992 Dependence on renal dialysis: Secondary | ICD-10-CM | POA: Diagnosis not present

## 2020-07-12 DIAGNOSIS — N2581 Secondary hyperparathyroidism of renal origin: Secondary | ICD-10-CM | POA: Diagnosis not present

## 2020-07-12 DIAGNOSIS — E875 Hyperkalemia: Secondary | ICD-10-CM | POA: Diagnosis not present

## 2020-07-12 DIAGNOSIS — N186 End stage renal disease: Secondary | ICD-10-CM | POA: Diagnosis not present

## 2020-07-14 DIAGNOSIS — N2581 Secondary hyperparathyroidism of renal origin: Secondary | ICD-10-CM | POA: Diagnosis not present

## 2020-07-14 DIAGNOSIS — E875 Hyperkalemia: Secondary | ICD-10-CM | POA: Diagnosis not present

## 2020-07-14 DIAGNOSIS — Z992 Dependence on renal dialysis: Secondary | ICD-10-CM | POA: Diagnosis not present

## 2020-07-14 DIAGNOSIS — N186 End stage renal disease: Secondary | ICD-10-CM | POA: Diagnosis not present

## 2020-07-15 DIAGNOSIS — E875 Hyperkalemia: Secondary | ICD-10-CM | POA: Diagnosis not present

## 2020-07-15 DIAGNOSIS — N186 End stage renal disease: Secondary | ICD-10-CM | POA: Diagnosis not present

## 2020-07-15 DIAGNOSIS — N2581 Secondary hyperparathyroidism of renal origin: Secondary | ICD-10-CM | POA: Diagnosis not present

## 2020-07-15 DIAGNOSIS — Z992 Dependence on renal dialysis: Secondary | ICD-10-CM | POA: Diagnosis not present

## 2020-07-18 DIAGNOSIS — N186 End stage renal disease: Secondary | ICD-10-CM | POA: Diagnosis not present

## 2020-07-18 DIAGNOSIS — N2581 Secondary hyperparathyroidism of renal origin: Secondary | ICD-10-CM | POA: Diagnosis not present

## 2020-07-18 DIAGNOSIS — E875 Hyperkalemia: Secondary | ICD-10-CM | POA: Diagnosis not present

## 2020-07-18 DIAGNOSIS — Z992 Dependence on renal dialysis: Secondary | ICD-10-CM | POA: Diagnosis not present

## 2020-07-19 DIAGNOSIS — Z992 Dependence on renal dialysis: Secondary | ICD-10-CM | POA: Diagnosis not present

## 2020-07-19 DIAGNOSIS — E875 Hyperkalemia: Secondary | ICD-10-CM | POA: Diagnosis not present

## 2020-07-19 DIAGNOSIS — N186 End stage renal disease: Secondary | ICD-10-CM | POA: Diagnosis not present

## 2020-07-19 DIAGNOSIS — N2581 Secondary hyperparathyroidism of renal origin: Secondary | ICD-10-CM | POA: Diagnosis not present

## 2020-07-21 DIAGNOSIS — N2581 Secondary hyperparathyroidism of renal origin: Secondary | ICD-10-CM | POA: Diagnosis not present

## 2020-07-21 DIAGNOSIS — E875 Hyperkalemia: Secondary | ICD-10-CM | POA: Diagnosis not present

## 2020-07-21 DIAGNOSIS — N186 End stage renal disease: Secondary | ICD-10-CM | POA: Diagnosis not present

## 2020-07-21 DIAGNOSIS — Z992 Dependence on renal dialysis: Secondary | ICD-10-CM | POA: Diagnosis not present

## 2020-07-22 DIAGNOSIS — N186 End stage renal disease: Secondary | ICD-10-CM | POA: Diagnosis not present

## 2020-07-22 DIAGNOSIS — N2581 Secondary hyperparathyroidism of renal origin: Secondary | ICD-10-CM | POA: Diagnosis not present

## 2020-07-22 DIAGNOSIS — E875 Hyperkalemia: Secondary | ICD-10-CM | POA: Diagnosis not present

## 2020-07-22 DIAGNOSIS — Z992 Dependence on renal dialysis: Secondary | ICD-10-CM | POA: Diagnosis not present

## 2020-07-25 DIAGNOSIS — Z992 Dependence on renal dialysis: Secondary | ICD-10-CM | POA: Diagnosis not present

## 2020-07-25 DIAGNOSIS — Z114 Encounter for screening for human immunodeficiency virus [HIV]: Secondary | ICD-10-CM | POA: Diagnosis not present

## 2020-07-25 DIAGNOSIS — Z01810 Encounter for preprocedural cardiovascular examination: Secondary | ICD-10-CM | POA: Diagnosis not present

## 2020-07-25 DIAGNOSIS — N2581 Secondary hyperparathyroidism of renal origin: Secondary | ICD-10-CM | POA: Diagnosis not present

## 2020-07-25 DIAGNOSIS — Z113 Encounter for screening for infections with a predominantly sexual mode of transmission: Secondary | ICD-10-CM | POA: Diagnosis not present

## 2020-07-25 DIAGNOSIS — E875 Hyperkalemia: Secondary | ICD-10-CM | POA: Diagnosis not present

## 2020-07-25 DIAGNOSIS — Z01818 Encounter for other preprocedural examination: Secondary | ICD-10-CM | POA: Diagnosis not present

## 2020-07-25 DIAGNOSIS — Z1159 Encounter for screening for other viral diseases: Secondary | ICD-10-CM | POA: Diagnosis not present

## 2020-07-25 DIAGNOSIS — N186 End stage renal disease: Secondary | ICD-10-CM | POA: Diagnosis not present

## 2020-07-26 DIAGNOSIS — E875 Hyperkalemia: Secondary | ICD-10-CM | POA: Diagnosis not present

## 2020-07-26 DIAGNOSIS — N186 End stage renal disease: Secondary | ICD-10-CM | POA: Diagnosis not present

## 2020-07-26 DIAGNOSIS — N2581 Secondary hyperparathyroidism of renal origin: Secondary | ICD-10-CM | POA: Diagnosis not present

## 2020-07-26 DIAGNOSIS — Z992 Dependence on renal dialysis: Secondary | ICD-10-CM | POA: Diagnosis not present

## 2020-07-28 DIAGNOSIS — N2581 Secondary hyperparathyroidism of renal origin: Secondary | ICD-10-CM | POA: Diagnosis not present

## 2020-07-28 DIAGNOSIS — Z992 Dependence on renal dialysis: Secondary | ICD-10-CM | POA: Diagnosis not present

## 2020-07-28 DIAGNOSIS — E875 Hyperkalemia: Secondary | ICD-10-CM | POA: Diagnosis not present

## 2020-07-28 DIAGNOSIS — N186 End stage renal disease: Secondary | ICD-10-CM | POA: Diagnosis not present

## 2020-07-29 DIAGNOSIS — E875 Hyperkalemia: Secondary | ICD-10-CM | POA: Diagnosis not present

## 2020-07-29 DIAGNOSIS — N186 End stage renal disease: Secondary | ICD-10-CM | POA: Diagnosis not present

## 2020-07-29 DIAGNOSIS — N2581 Secondary hyperparathyroidism of renal origin: Secondary | ICD-10-CM | POA: Diagnosis not present

## 2020-07-29 DIAGNOSIS — Z992 Dependence on renal dialysis: Secondary | ICD-10-CM | POA: Diagnosis not present

## 2020-08-01 DIAGNOSIS — E875 Hyperkalemia: Secondary | ICD-10-CM | POA: Diagnosis not present

## 2020-08-01 DIAGNOSIS — N186 End stage renal disease: Secondary | ICD-10-CM | POA: Diagnosis not present

## 2020-08-01 DIAGNOSIS — Z992 Dependence on renal dialysis: Secondary | ICD-10-CM | POA: Diagnosis not present

## 2020-08-01 DIAGNOSIS — N2581 Secondary hyperparathyroidism of renal origin: Secondary | ICD-10-CM | POA: Diagnosis not present

## 2020-08-02 DIAGNOSIS — E875 Hyperkalemia: Secondary | ICD-10-CM | POA: Diagnosis not present

## 2020-08-02 DIAGNOSIS — N2581 Secondary hyperparathyroidism of renal origin: Secondary | ICD-10-CM | POA: Diagnosis not present

## 2020-08-02 DIAGNOSIS — Z992 Dependence on renal dialysis: Secondary | ICD-10-CM | POA: Diagnosis not present

## 2020-08-02 DIAGNOSIS — N186 End stage renal disease: Secondary | ICD-10-CM | POA: Diagnosis not present

## 2020-08-04 DIAGNOSIS — Z992 Dependence on renal dialysis: Secondary | ICD-10-CM | POA: Diagnosis not present

## 2020-08-04 DIAGNOSIS — N186 End stage renal disease: Secondary | ICD-10-CM | POA: Diagnosis not present

## 2020-08-04 DIAGNOSIS — N2581 Secondary hyperparathyroidism of renal origin: Secondary | ICD-10-CM | POA: Diagnosis not present

## 2020-08-04 DIAGNOSIS — E875 Hyperkalemia: Secondary | ICD-10-CM | POA: Diagnosis not present

## 2020-08-05 DIAGNOSIS — N186 End stage renal disease: Secondary | ICD-10-CM | POA: Diagnosis not present

## 2020-08-05 DIAGNOSIS — E875 Hyperkalemia: Secondary | ICD-10-CM | POA: Diagnosis not present

## 2020-08-05 DIAGNOSIS — N2581 Secondary hyperparathyroidism of renal origin: Secondary | ICD-10-CM | POA: Diagnosis not present

## 2020-08-05 DIAGNOSIS — Z992 Dependence on renal dialysis: Secondary | ICD-10-CM | POA: Diagnosis not present

## 2020-08-08 DIAGNOSIS — N186 End stage renal disease: Secondary | ICD-10-CM | POA: Diagnosis not present

## 2020-08-08 DIAGNOSIS — Z992 Dependence on renal dialysis: Secondary | ICD-10-CM | POA: Diagnosis not present

## 2020-08-08 DIAGNOSIS — N2581 Secondary hyperparathyroidism of renal origin: Secondary | ICD-10-CM | POA: Diagnosis not present

## 2020-08-08 DIAGNOSIS — E875 Hyperkalemia: Secondary | ICD-10-CM | POA: Diagnosis not present

## 2020-08-09 DIAGNOSIS — N186 End stage renal disease: Secondary | ICD-10-CM | POA: Diagnosis not present

## 2020-08-09 DIAGNOSIS — Z992 Dependence on renal dialysis: Secondary | ICD-10-CM | POA: Diagnosis not present

## 2020-08-09 DIAGNOSIS — E875 Hyperkalemia: Secondary | ICD-10-CM | POA: Diagnosis not present

## 2020-08-09 DIAGNOSIS — N2581 Secondary hyperparathyroidism of renal origin: Secondary | ICD-10-CM | POA: Diagnosis not present

## 2020-08-10 DIAGNOSIS — E1122 Type 2 diabetes mellitus with diabetic chronic kidney disease: Secondary | ICD-10-CM | POA: Diagnosis not present

## 2020-08-10 DIAGNOSIS — N186 End stage renal disease: Secondary | ICD-10-CM | POA: Diagnosis not present

## 2020-08-10 DIAGNOSIS — E785 Hyperlipidemia, unspecified: Secondary | ICD-10-CM | POA: Diagnosis not present

## 2020-08-10 DIAGNOSIS — Z992 Dependence on renal dialysis: Secondary | ICD-10-CM | POA: Diagnosis not present

## 2020-08-11 DIAGNOSIS — E875 Hyperkalemia: Secondary | ICD-10-CM | POA: Diagnosis not present

## 2020-08-11 DIAGNOSIS — N186 End stage renal disease: Secondary | ICD-10-CM | POA: Diagnosis not present

## 2020-08-11 DIAGNOSIS — N2581 Secondary hyperparathyroidism of renal origin: Secondary | ICD-10-CM | POA: Diagnosis not present

## 2020-08-11 DIAGNOSIS — Z992 Dependence on renal dialysis: Secondary | ICD-10-CM | POA: Diagnosis not present

## 2020-08-12 DIAGNOSIS — Z992 Dependence on renal dialysis: Secondary | ICD-10-CM | POA: Diagnosis not present

## 2020-08-12 DIAGNOSIS — N2581 Secondary hyperparathyroidism of renal origin: Secondary | ICD-10-CM | POA: Diagnosis not present

## 2020-08-12 DIAGNOSIS — E1022 Type 1 diabetes mellitus with diabetic chronic kidney disease: Secondary | ICD-10-CM | POA: Diagnosis not present

## 2020-08-12 DIAGNOSIS — N186 End stage renal disease: Secondary | ICD-10-CM | POA: Diagnosis not present

## 2020-08-12 DIAGNOSIS — E875 Hyperkalemia: Secondary | ICD-10-CM | POA: Diagnosis not present

## 2020-08-14 DIAGNOSIS — Z992 Dependence on renal dialysis: Secondary | ICD-10-CM | POA: Diagnosis not present

## 2020-08-14 DIAGNOSIS — N186 End stage renal disease: Secondary | ICD-10-CM | POA: Diagnosis not present

## 2020-08-14 DIAGNOSIS — N2581 Secondary hyperparathyroidism of renal origin: Secondary | ICD-10-CM | POA: Diagnosis not present

## 2020-08-14 DIAGNOSIS — E875 Hyperkalemia: Secondary | ICD-10-CM | POA: Diagnosis not present

## 2020-08-16 DIAGNOSIS — N2581 Secondary hyperparathyroidism of renal origin: Secondary | ICD-10-CM | POA: Diagnosis not present

## 2020-08-16 DIAGNOSIS — E875 Hyperkalemia: Secondary | ICD-10-CM | POA: Diagnosis not present

## 2020-08-16 DIAGNOSIS — Z992 Dependence on renal dialysis: Secondary | ICD-10-CM | POA: Diagnosis not present

## 2020-08-16 DIAGNOSIS — N186 End stage renal disease: Secondary | ICD-10-CM | POA: Diagnosis not present

## 2020-08-17 ENCOUNTER — Other Ambulatory Visit: Payer: Federal, State, Local not specified - PPO

## 2020-08-17 DIAGNOSIS — Z20822 Contact with and (suspected) exposure to covid-19: Secondary | ICD-10-CM

## 2020-08-18 DIAGNOSIS — N2581 Secondary hyperparathyroidism of renal origin: Secondary | ICD-10-CM | POA: Diagnosis not present

## 2020-08-18 DIAGNOSIS — Z992 Dependence on renal dialysis: Secondary | ICD-10-CM | POA: Diagnosis not present

## 2020-08-18 DIAGNOSIS — E875 Hyperkalemia: Secondary | ICD-10-CM | POA: Diagnosis not present

## 2020-08-18 DIAGNOSIS — N186 End stage renal disease: Secondary | ICD-10-CM | POA: Diagnosis not present

## 2020-08-18 LAB — SARS-COV-2, NAA 2 DAY TAT

## 2020-08-18 LAB — NOVEL CORONAVIRUS, NAA: SARS-CoV-2, NAA: NOT DETECTED

## 2020-08-20 DIAGNOSIS — Z992 Dependence on renal dialysis: Secondary | ICD-10-CM | POA: Diagnosis not present

## 2020-08-20 DIAGNOSIS — E875 Hyperkalemia: Secondary | ICD-10-CM | POA: Diagnosis not present

## 2020-08-20 DIAGNOSIS — N2581 Secondary hyperparathyroidism of renal origin: Secondary | ICD-10-CM | POA: Diagnosis not present

## 2020-08-20 DIAGNOSIS — N186 End stage renal disease: Secondary | ICD-10-CM | POA: Diagnosis not present

## 2020-08-23 DIAGNOSIS — N2581 Secondary hyperparathyroidism of renal origin: Secondary | ICD-10-CM | POA: Diagnosis not present

## 2020-08-23 DIAGNOSIS — N186 End stage renal disease: Secondary | ICD-10-CM | POA: Diagnosis not present

## 2020-08-23 DIAGNOSIS — Z992 Dependence on renal dialysis: Secondary | ICD-10-CM | POA: Diagnosis not present

## 2020-08-23 DIAGNOSIS — E875 Hyperkalemia: Secondary | ICD-10-CM | POA: Diagnosis not present

## 2020-08-25 DIAGNOSIS — N186 End stage renal disease: Secondary | ICD-10-CM | POA: Diagnosis not present

## 2020-08-25 DIAGNOSIS — Z992 Dependence on renal dialysis: Secondary | ICD-10-CM | POA: Diagnosis not present

## 2020-08-25 DIAGNOSIS — E875 Hyperkalemia: Secondary | ICD-10-CM | POA: Diagnosis not present

## 2020-08-25 DIAGNOSIS — N2581 Secondary hyperparathyroidism of renal origin: Secondary | ICD-10-CM | POA: Diagnosis not present

## 2020-08-26 DIAGNOSIS — E875 Hyperkalemia: Secondary | ICD-10-CM | POA: Diagnosis not present

## 2020-08-26 DIAGNOSIS — N2581 Secondary hyperparathyroidism of renal origin: Secondary | ICD-10-CM | POA: Diagnosis not present

## 2020-08-26 DIAGNOSIS — Z992 Dependence on renal dialysis: Secondary | ICD-10-CM | POA: Diagnosis not present

## 2020-08-26 DIAGNOSIS — N186 End stage renal disease: Secondary | ICD-10-CM | POA: Diagnosis not present

## 2020-08-27 DIAGNOSIS — E875 Hyperkalemia: Secondary | ICD-10-CM | POA: Diagnosis not present

## 2020-08-27 DIAGNOSIS — N186 End stage renal disease: Secondary | ICD-10-CM | POA: Diagnosis not present

## 2020-08-27 DIAGNOSIS — N2581 Secondary hyperparathyroidism of renal origin: Secondary | ICD-10-CM | POA: Diagnosis not present

## 2020-08-27 DIAGNOSIS — Z992 Dependence on renal dialysis: Secondary | ICD-10-CM | POA: Diagnosis not present

## 2020-08-29 DIAGNOSIS — N2581 Secondary hyperparathyroidism of renal origin: Secondary | ICD-10-CM | POA: Diagnosis not present

## 2020-08-29 DIAGNOSIS — E875 Hyperkalemia: Secondary | ICD-10-CM | POA: Diagnosis not present

## 2020-08-29 DIAGNOSIS — Z992 Dependence on renal dialysis: Secondary | ICD-10-CM | POA: Diagnosis not present

## 2020-08-29 DIAGNOSIS — N186 End stage renal disease: Secondary | ICD-10-CM | POA: Diagnosis not present

## 2020-08-30 DIAGNOSIS — E875 Hyperkalemia: Secondary | ICD-10-CM | POA: Diagnosis not present

## 2020-08-30 DIAGNOSIS — N186 End stage renal disease: Secondary | ICD-10-CM | POA: Diagnosis not present

## 2020-08-30 DIAGNOSIS — Z992 Dependence on renal dialysis: Secondary | ICD-10-CM | POA: Diagnosis not present

## 2020-08-30 DIAGNOSIS — N2581 Secondary hyperparathyroidism of renal origin: Secondary | ICD-10-CM | POA: Diagnosis not present

## 2020-09-01 DIAGNOSIS — H4311 Vitreous hemorrhage, right eye: Secondary | ICD-10-CM | POA: Diagnosis not present

## 2020-09-01 DIAGNOSIS — H3582 Retinal ischemia: Secondary | ICD-10-CM | POA: Diagnosis not present

## 2020-09-01 DIAGNOSIS — E103513 Type 1 diabetes mellitus with proliferative diabetic retinopathy with macular edema, bilateral: Secondary | ICD-10-CM | POA: Diagnosis not present

## 2020-09-01 DIAGNOSIS — H35033 Hypertensive retinopathy, bilateral: Secondary | ICD-10-CM | POA: Diagnosis not present

## 2020-09-02 DIAGNOSIS — Z992 Dependence on renal dialysis: Secondary | ICD-10-CM | POA: Diagnosis not present

## 2020-09-02 DIAGNOSIS — N2581 Secondary hyperparathyroidism of renal origin: Secondary | ICD-10-CM | POA: Diagnosis not present

## 2020-09-02 DIAGNOSIS — E875 Hyperkalemia: Secondary | ICD-10-CM | POA: Diagnosis not present

## 2020-09-02 DIAGNOSIS — N186 End stage renal disease: Secondary | ICD-10-CM | POA: Diagnosis not present

## 2020-09-03 DIAGNOSIS — Z992 Dependence on renal dialysis: Secondary | ICD-10-CM | POA: Diagnosis not present

## 2020-09-03 DIAGNOSIS — N186 End stage renal disease: Secondary | ICD-10-CM | POA: Diagnosis not present

## 2020-09-03 DIAGNOSIS — N2581 Secondary hyperparathyroidism of renal origin: Secondary | ICD-10-CM | POA: Diagnosis not present

## 2020-09-03 DIAGNOSIS — E875 Hyperkalemia: Secondary | ICD-10-CM | POA: Diagnosis not present

## 2020-09-05 DIAGNOSIS — N2581 Secondary hyperparathyroidism of renal origin: Secondary | ICD-10-CM | POA: Diagnosis not present

## 2020-09-05 DIAGNOSIS — Z992 Dependence on renal dialysis: Secondary | ICD-10-CM | POA: Diagnosis not present

## 2020-09-05 DIAGNOSIS — N186 End stage renal disease: Secondary | ICD-10-CM | POA: Diagnosis not present

## 2020-09-05 DIAGNOSIS — E875 Hyperkalemia: Secondary | ICD-10-CM | POA: Diagnosis not present

## 2020-09-06 DIAGNOSIS — Z992 Dependence on renal dialysis: Secondary | ICD-10-CM | POA: Diagnosis not present

## 2020-09-06 DIAGNOSIS — N186 End stage renal disease: Secondary | ICD-10-CM | POA: Diagnosis not present

## 2020-09-06 DIAGNOSIS — N2581 Secondary hyperparathyroidism of renal origin: Secondary | ICD-10-CM | POA: Diagnosis not present

## 2020-09-06 DIAGNOSIS — E875 Hyperkalemia: Secondary | ICD-10-CM | POA: Diagnosis not present

## 2020-09-08 DIAGNOSIS — E875 Hyperkalemia: Secondary | ICD-10-CM | POA: Diagnosis not present

## 2020-09-08 DIAGNOSIS — Z992 Dependence on renal dialysis: Secondary | ICD-10-CM | POA: Diagnosis not present

## 2020-09-08 DIAGNOSIS — N2581 Secondary hyperparathyroidism of renal origin: Secondary | ICD-10-CM | POA: Diagnosis not present

## 2020-09-08 DIAGNOSIS — N186 End stage renal disease: Secondary | ICD-10-CM | POA: Diagnosis not present

## 2020-09-10 DIAGNOSIS — E875 Hyperkalemia: Secondary | ICD-10-CM | POA: Diagnosis not present

## 2020-09-10 DIAGNOSIS — Z992 Dependence on renal dialysis: Secondary | ICD-10-CM | POA: Diagnosis not present

## 2020-09-10 DIAGNOSIS — N186 End stage renal disease: Secondary | ICD-10-CM | POA: Diagnosis not present

## 2020-09-10 DIAGNOSIS — N2581 Secondary hyperparathyroidism of renal origin: Secondary | ICD-10-CM | POA: Diagnosis not present

## 2020-09-12 DIAGNOSIS — N2581 Secondary hyperparathyroidism of renal origin: Secondary | ICD-10-CM | POA: Diagnosis not present

## 2020-09-12 DIAGNOSIS — E875 Hyperkalemia: Secondary | ICD-10-CM | POA: Diagnosis not present

## 2020-09-12 DIAGNOSIS — N186 End stage renal disease: Secondary | ICD-10-CM | POA: Diagnosis not present

## 2020-09-12 DIAGNOSIS — Z992 Dependence on renal dialysis: Secondary | ICD-10-CM | POA: Diagnosis not present

## 2020-09-12 DIAGNOSIS — E1022 Type 1 diabetes mellitus with diabetic chronic kidney disease: Secondary | ICD-10-CM | POA: Diagnosis not present

## 2020-09-13 DIAGNOSIS — L03311 Cellulitis of abdominal wall: Secondary | ICD-10-CM | POA: Diagnosis not present

## 2020-09-13 DIAGNOSIS — E1342 Other specified diabetes mellitus with diabetic polyneuropathy: Secondary | ICD-10-CM | POA: Diagnosis not present

## 2020-09-13 DIAGNOSIS — Z992 Dependence on renal dialysis: Secondary | ICD-10-CM | POA: Diagnosis not present

## 2020-09-13 DIAGNOSIS — E875 Hyperkalemia: Secondary | ICD-10-CM | POA: Diagnosis not present

## 2020-09-13 DIAGNOSIS — I10 Essential (primary) hypertension: Secondary | ICD-10-CM | POA: Diagnosis not present

## 2020-09-13 DIAGNOSIS — N186 End stage renal disease: Secondary | ICD-10-CM | POA: Diagnosis not present

## 2020-09-13 DIAGNOSIS — N2581 Secondary hyperparathyroidism of renal origin: Secondary | ICD-10-CM | POA: Diagnosis not present

## 2020-09-14 NOTE — Progress Notes (Deleted)
Follow-up Visit   Date: 09/14/20   Hector Mahoney MRN: [id number] DOB: November 18, 1970   Interim History: Hector Mahoney is a 50 y.o. right-handed male with poorly controlled diabetes, hypertension, ESRD on HD, hyperlipidemia, hypertension, and CHF returning to the clinic for follow-up of myoclonic jerks.  The patient was accompanied to the clinic by *** who also provides collateral information.    History of present illness: Starting around the summer of 2021, he has noticed different sensation of the skin and muscle jerking.  When he is at rest, such as resting in bed, he has uncontrollable episodic jerking of the arms or the legs.  The movements are very brief and vary in location, but the entire leg may kick or the arm may fling.  There is no repetitive movement, loss of consciousness, or weakness. Symptoms have not progressed.  When severe it may last several hours to all night long. He has noticed that it tends to occur after dialysis.  He is currently training to do home hemodialysis four times per week, 3h 20 min, as compared to three times per week when he was at the center.  His movements are not as intense since making this transition.    UPDATE ***:  At his last visit in November, he was started on Keppra 250mg  BID. ***  Medications:  Current Outpatient Medications on File Prior to Visit  Medication Sig Dispense Refill  . amLODipine (NORVASC) 5 MG tablet Take 1 tablet (5 mg total) by mouth daily. (Patient taking differently: Take 5 mg by mouth at bedtime. ) 30 tablet 1  . amoxicillin-clavulanate (AUGMENTIN) 500-125 MG tablet 1 tab po bid for 10 days (Patient not taking: Reported on 06/13/2020) 20 tablet 0  . aspirin EC 81 MG EC tablet Take 1 tablet (81 mg total) by mouth daily. 30 tablet 0  . calcitRIOL (ROCALTROL) 0.25 MCG capsule Take 1 capsule (0.25 mcg total) by mouth every Monday, Wednesday, and Friday at 6 PM. 15 capsule 0  . calcium acetate (PHOSLO) 667 MG capsule  Take 2 capsules (1,334 mg total) by mouth 3 (three) times daily with meals. (Patient taking differently: Take 2,001 mg by mouth 3 (three) times daily with meals. ) 180 capsule 3  . cetirizine (ZYRTEC) 10 MG tablet Take 1 tablet by mouth once daily 30 tablet 0  . cinacalcet (SENSIPAR) 30 MG tablet Take 30 mg by mouth every evening.     . fexofenadine (ALLEGRA) 180 MG tablet Take 1 tablet (180 mg total) by mouth daily. 90 tablet 3  . gabapentin (NEURONTIN) 300 MG capsule 1-2 tab by mouth at bedtime as needed 180 capsule 1  . glycopyrrolate (ROBINUL) 2 MG tablet Take 1 tablet (2 mg total) by mouth 2 (two) times daily. 60 tablet 11  . insulin aspart (NOVOLOG) 100 UNIT/ML injection Inject 8-12 Units into the skin 3 (three) times daily with meals. 10 mL 11  . insulin glargine (LANTUS) 100 UNIT/ML injection Inject 0.25 mLs (25 Units total) into the skin at bedtime. 20 mL 5  . levETIRAcetam (KEPPRA) 250 MG tablet Take 1 tablet (250 mg total) by mouth at bedtime. 30 tablet 5  . meclizine (ANTIVERT) 12.5 MG tablet TAKE 1 TABLET BY MOUTH THREE TIMES DAILY AS NEEDED FOR DIZZINESS 40 tablet 0  . Methoxy PEG-Epoetin Beta (MIRCERA IJ) Mircera    . omeprazole (PRILOSEC) 40 MG capsule Take 1 capsule (40 mg total) by mouth 2 (two) times daily. 60 capsule 11  .  rosuvastatin (CRESTOR) 10 MG tablet Take 1 tablet (10 mg total) by mouth daily. 30 tablet 3  . triamcinolone (NASACORT) 55 MCG/ACT AERO nasal inhaler Place 2 sprays into the nose daily. 1 each 0  . Tuberculin PPD (TUBERSOL ID) Inject into the skin.     Marland Kitchen VITAMIN D PO Take by mouth.     No current facility-administered medications on file prior to visit.    Allergies: No Known Allergies  Vital Signs:  There were no vitals taken for this visit.   General Medical Exam:   General:  Well appearing, comfortable  Eyes/ENT: see cranial nerve examination.   Neck:  No carotid bruits. Respiratory:  Clear to auscultation, good air entry bilaterally.   Cardiac:   Regular rate and rhythm, no murmur.   Ext:  No edema ***  Neurological Exam: MENTAL STATUS including orientation to time, place, person, recent and remote memory, attention span and concentration, language, and fund of knowledge is ***normal.  Speech is not dysarthric.  CRANIAL NERVES:  No visual field defects.  Pupils equal round and reactive to light.  Normal conjugate, extra-ocular eye movements in all directions of gaze.  No ptosis ***.  Face is symmetric. Palate elevates symmetrically.  Tongue is midline.  MOTOR:  Motor strength is 5/5 in all extremities, ***.  No atrophy, fasciculations or abnormal movements.  No pronator drift.  Tone is normal.    MSRs:  Reflexes are 2+/4 throughout ***.  SENSORY:  Intact to vibration throughout ***.  COORDINATION/GAIT:  Normal finger-to- nose-finger.  Intact rapid alternating movements bilaterally.  Gait narrow based and stable.   Data:***  IMPRESSION/PLAN: ***  Myoclonic jerks, likely due to electrolyte changes following dialysis. TSH is normal.             - Start keppra 250mg  at bedtime.  Titrate as needed             - Consider benzodiazepine going forward  Diabetic polyneuropathy manifesting with numbness in the feet             - Patient educated on daily foot inspection, fall prevention, and safety precautions around the home.  PLAN/RECOMMENDATIONS:  ***  Return to clinic in ***.   Total time spent:  ***  Thank you for allowing me to participate in patient's care.  If I can answer any additional questions, I would be pleased to do so.    Sincerely,    Ece Cumberland K. Posey Pronto, DO

## 2020-09-15 ENCOUNTER — Ambulatory Visit: Payer: Federal, State, Local not specified - PPO | Admitting: Internal Medicine

## 2020-09-15 DIAGNOSIS — E875 Hyperkalemia: Secondary | ICD-10-CM | POA: Diagnosis not present

## 2020-09-15 DIAGNOSIS — N2581 Secondary hyperparathyroidism of renal origin: Secondary | ICD-10-CM | POA: Diagnosis not present

## 2020-09-15 DIAGNOSIS — N186 End stage renal disease: Secondary | ICD-10-CM | POA: Diagnosis not present

## 2020-09-15 DIAGNOSIS — Z0289 Encounter for other administrative examinations: Secondary | ICD-10-CM

## 2020-09-15 DIAGNOSIS — Z992 Dependence on renal dialysis: Secondary | ICD-10-CM | POA: Diagnosis not present

## 2020-09-16 DIAGNOSIS — Z992 Dependence on renal dialysis: Secondary | ICD-10-CM | POA: Diagnosis not present

## 2020-09-16 DIAGNOSIS — N186 End stage renal disease: Secondary | ICD-10-CM | POA: Diagnosis not present

## 2020-09-16 DIAGNOSIS — N2581 Secondary hyperparathyroidism of renal origin: Secondary | ICD-10-CM | POA: Diagnosis not present

## 2020-09-16 DIAGNOSIS — E875 Hyperkalemia: Secondary | ICD-10-CM | POA: Diagnosis not present

## 2020-09-19 ENCOUNTER — Other Ambulatory Visit: Payer: Self-pay

## 2020-09-19 ENCOUNTER — Telehealth: Payer: Federal, State, Local not specified - PPO | Admitting: Neurology

## 2020-09-19 DIAGNOSIS — N186 End stage renal disease: Secondary | ICD-10-CM | POA: Diagnosis not present

## 2020-09-19 DIAGNOSIS — Z992 Dependence on renal dialysis: Secondary | ICD-10-CM | POA: Diagnosis not present

## 2020-09-19 DIAGNOSIS — N2581 Secondary hyperparathyroidism of renal origin: Secondary | ICD-10-CM | POA: Diagnosis not present

## 2020-09-19 DIAGNOSIS — E875 Hyperkalemia: Secondary | ICD-10-CM | POA: Diagnosis not present

## 2020-09-20 DIAGNOSIS — Z992 Dependence on renal dialysis: Secondary | ICD-10-CM | POA: Diagnosis not present

## 2020-09-20 DIAGNOSIS — N186 End stage renal disease: Secondary | ICD-10-CM | POA: Diagnosis not present

## 2020-09-20 DIAGNOSIS — N2581 Secondary hyperparathyroidism of renal origin: Secondary | ICD-10-CM | POA: Diagnosis not present

## 2020-09-20 DIAGNOSIS — E875 Hyperkalemia: Secondary | ICD-10-CM | POA: Diagnosis not present

## 2020-09-23 DIAGNOSIS — Z992 Dependence on renal dialysis: Secondary | ICD-10-CM | POA: Diagnosis not present

## 2020-09-23 DIAGNOSIS — E875 Hyperkalemia: Secondary | ICD-10-CM | POA: Diagnosis not present

## 2020-09-23 DIAGNOSIS — N2581 Secondary hyperparathyroidism of renal origin: Secondary | ICD-10-CM | POA: Diagnosis not present

## 2020-09-23 DIAGNOSIS — N186 End stage renal disease: Secondary | ICD-10-CM | POA: Diagnosis not present

## 2020-09-24 DIAGNOSIS — N186 End stage renal disease: Secondary | ICD-10-CM | POA: Diagnosis not present

## 2020-09-24 DIAGNOSIS — N2581 Secondary hyperparathyroidism of renal origin: Secondary | ICD-10-CM | POA: Diagnosis not present

## 2020-09-24 DIAGNOSIS — Z992 Dependence on renal dialysis: Secondary | ICD-10-CM | POA: Diagnosis not present

## 2020-09-24 DIAGNOSIS — E875 Hyperkalemia: Secondary | ICD-10-CM | POA: Diagnosis not present

## 2020-09-26 DIAGNOSIS — Z992 Dependence on renal dialysis: Secondary | ICD-10-CM | POA: Diagnosis not present

## 2020-09-26 DIAGNOSIS — N2581 Secondary hyperparathyroidism of renal origin: Secondary | ICD-10-CM | POA: Diagnosis not present

## 2020-09-26 DIAGNOSIS — E875 Hyperkalemia: Secondary | ICD-10-CM | POA: Diagnosis not present

## 2020-09-26 DIAGNOSIS — N186 End stage renal disease: Secondary | ICD-10-CM | POA: Diagnosis not present

## 2020-09-27 DIAGNOSIS — N186 End stage renal disease: Secondary | ICD-10-CM | POA: Diagnosis not present

## 2020-09-27 DIAGNOSIS — E875 Hyperkalemia: Secondary | ICD-10-CM | POA: Diagnosis not present

## 2020-09-27 DIAGNOSIS — Z992 Dependence on renal dialysis: Secondary | ICD-10-CM | POA: Diagnosis not present

## 2020-09-27 DIAGNOSIS — N2581 Secondary hyperparathyroidism of renal origin: Secondary | ICD-10-CM | POA: Diagnosis not present

## 2020-09-29 DIAGNOSIS — E103511 Type 1 diabetes mellitus with proliferative diabetic retinopathy with macular edema, right eye: Secondary | ICD-10-CM | POA: Diagnosis not present

## 2020-09-30 DIAGNOSIS — N2581 Secondary hyperparathyroidism of renal origin: Secondary | ICD-10-CM | POA: Diagnosis not present

## 2020-09-30 DIAGNOSIS — E875 Hyperkalemia: Secondary | ICD-10-CM | POA: Diagnosis not present

## 2020-09-30 DIAGNOSIS — N186 End stage renal disease: Secondary | ICD-10-CM | POA: Diagnosis not present

## 2020-09-30 DIAGNOSIS — Z992 Dependence on renal dialysis: Secondary | ICD-10-CM | POA: Diagnosis not present

## 2020-10-01 DIAGNOSIS — N186 End stage renal disease: Secondary | ICD-10-CM | POA: Diagnosis not present

## 2020-10-01 DIAGNOSIS — Z992 Dependence on renal dialysis: Secondary | ICD-10-CM | POA: Diagnosis not present

## 2020-10-01 DIAGNOSIS — E875 Hyperkalemia: Secondary | ICD-10-CM | POA: Diagnosis not present

## 2020-10-01 DIAGNOSIS — N2581 Secondary hyperparathyroidism of renal origin: Secondary | ICD-10-CM | POA: Diagnosis not present

## 2020-10-03 DIAGNOSIS — E875 Hyperkalemia: Secondary | ICD-10-CM | POA: Diagnosis not present

## 2020-10-03 DIAGNOSIS — N2581 Secondary hyperparathyroidism of renal origin: Secondary | ICD-10-CM | POA: Diagnosis not present

## 2020-10-03 DIAGNOSIS — Z992 Dependence on renal dialysis: Secondary | ICD-10-CM | POA: Diagnosis not present

## 2020-10-03 DIAGNOSIS — N186 End stage renal disease: Secondary | ICD-10-CM | POA: Diagnosis not present

## 2020-10-04 DIAGNOSIS — N2581 Secondary hyperparathyroidism of renal origin: Secondary | ICD-10-CM | POA: Diagnosis not present

## 2020-10-04 DIAGNOSIS — E875 Hyperkalemia: Secondary | ICD-10-CM | POA: Diagnosis not present

## 2020-10-04 DIAGNOSIS — Z992 Dependence on renal dialysis: Secondary | ICD-10-CM | POA: Diagnosis not present

## 2020-10-04 DIAGNOSIS — N186 End stage renal disease: Secondary | ICD-10-CM | POA: Diagnosis not present

## 2020-10-05 DIAGNOSIS — Z992 Dependence on renal dialysis: Secondary | ICD-10-CM | POA: Diagnosis not present

## 2020-10-05 DIAGNOSIS — N186 End stage renal disease: Secondary | ICD-10-CM | POA: Diagnosis not present

## 2020-10-05 DIAGNOSIS — E1122 Type 2 diabetes mellitus with diabetic chronic kidney disease: Secondary | ICD-10-CM | POA: Diagnosis not present

## 2020-10-06 DIAGNOSIS — Z992 Dependence on renal dialysis: Secondary | ICD-10-CM | POA: Diagnosis not present

## 2020-10-06 DIAGNOSIS — N2581 Secondary hyperparathyroidism of renal origin: Secondary | ICD-10-CM | POA: Diagnosis not present

## 2020-10-06 DIAGNOSIS — N186 End stage renal disease: Secondary | ICD-10-CM | POA: Diagnosis not present

## 2020-10-06 DIAGNOSIS — E875 Hyperkalemia: Secondary | ICD-10-CM | POA: Diagnosis not present

## 2020-10-07 ENCOUNTER — Other Ambulatory Visit: Payer: Self-pay | Admitting: Internal Medicine

## 2020-10-07 DIAGNOSIS — Z992 Dependence on renal dialysis: Secondary | ICD-10-CM | POA: Diagnosis not present

## 2020-10-07 DIAGNOSIS — N2581 Secondary hyperparathyroidism of renal origin: Secondary | ICD-10-CM | POA: Diagnosis not present

## 2020-10-07 DIAGNOSIS — E875 Hyperkalemia: Secondary | ICD-10-CM | POA: Diagnosis not present

## 2020-10-07 DIAGNOSIS — N186 End stage renal disease: Secondary | ICD-10-CM | POA: Diagnosis not present

## 2020-10-07 NOTE — Telephone Encounter (Signed)
Please refill as per office routine med refill policy (all routine meds refilled for 3 mo or monthly per pt preference up to one year from last visit, then month to month grace period for 3 mo, then further med refills will have to be denied)  

## 2020-10-09 DIAGNOSIS — Z992 Dependence on renal dialysis: Secondary | ICD-10-CM | POA: Diagnosis not present

## 2020-10-09 DIAGNOSIS — N2581 Secondary hyperparathyroidism of renal origin: Secondary | ICD-10-CM | POA: Diagnosis not present

## 2020-10-09 DIAGNOSIS — E875 Hyperkalemia: Secondary | ICD-10-CM | POA: Diagnosis not present

## 2020-10-09 DIAGNOSIS — N186 End stage renal disease: Secondary | ICD-10-CM | POA: Diagnosis not present

## 2020-10-10 DIAGNOSIS — Z992 Dependence on renal dialysis: Secondary | ICD-10-CM | POA: Diagnosis not present

## 2020-10-10 DIAGNOSIS — N2581 Secondary hyperparathyroidism of renal origin: Secondary | ICD-10-CM | POA: Diagnosis not present

## 2020-10-10 DIAGNOSIS — N186 End stage renal disease: Secondary | ICD-10-CM | POA: Diagnosis not present

## 2020-10-10 DIAGNOSIS — E875 Hyperkalemia: Secondary | ICD-10-CM | POA: Diagnosis not present

## 2020-10-10 DIAGNOSIS — E1022 Type 1 diabetes mellitus with diabetic chronic kidney disease: Secondary | ICD-10-CM | POA: Diagnosis not present

## 2020-10-10 NOTE — Telephone Encounter (Signed)
Notified pt via voicemail that for future refills he needs an office visit for his annual

## 2020-10-11 DIAGNOSIS — N186 End stage renal disease: Secondary | ICD-10-CM | POA: Diagnosis not present

## 2020-10-11 DIAGNOSIS — Z992 Dependence on renal dialysis: Secondary | ICD-10-CM | POA: Diagnosis not present

## 2020-10-11 DIAGNOSIS — E875 Hyperkalemia: Secondary | ICD-10-CM | POA: Diagnosis not present

## 2020-10-11 DIAGNOSIS — N2581 Secondary hyperparathyroidism of renal origin: Secondary | ICD-10-CM | POA: Diagnosis not present

## 2020-10-13 DIAGNOSIS — E875 Hyperkalemia: Secondary | ICD-10-CM | POA: Diagnosis not present

## 2020-10-13 DIAGNOSIS — Z992 Dependence on renal dialysis: Secondary | ICD-10-CM | POA: Diagnosis not present

## 2020-10-13 DIAGNOSIS — N186 End stage renal disease: Secondary | ICD-10-CM | POA: Diagnosis not present

## 2020-10-13 DIAGNOSIS — N2581 Secondary hyperparathyroidism of renal origin: Secondary | ICD-10-CM | POA: Diagnosis not present

## 2020-10-16 DIAGNOSIS — N2581 Secondary hyperparathyroidism of renal origin: Secondary | ICD-10-CM | POA: Diagnosis not present

## 2020-10-16 DIAGNOSIS — N186 End stage renal disease: Secondary | ICD-10-CM | POA: Diagnosis not present

## 2020-10-16 DIAGNOSIS — Z992 Dependence on renal dialysis: Secondary | ICD-10-CM | POA: Diagnosis not present

## 2020-10-16 DIAGNOSIS — E875 Hyperkalemia: Secondary | ICD-10-CM | POA: Diagnosis not present

## 2020-10-17 DIAGNOSIS — N189 Chronic kidney disease, unspecified: Secondary | ICD-10-CM | POA: Diagnosis not present

## 2020-10-17 DIAGNOSIS — Z01810 Encounter for preprocedural cardiovascular examination: Secondary | ICD-10-CM | POA: Diagnosis not present

## 2020-10-17 DIAGNOSIS — Z0120 Encounter for dental examination and cleaning without abnormal findings: Secondary | ICD-10-CM | POA: Diagnosis not present

## 2020-10-17 DIAGNOSIS — K429 Umbilical hernia without obstruction or gangrene: Secondary | ICD-10-CM | POA: Diagnosis not present

## 2020-10-17 DIAGNOSIS — K449 Diaphragmatic hernia without obstruction or gangrene: Secondary | ICD-10-CM | POA: Diagnosis not present

## 2020-10-17 DIAGNOSIS — Z01818 Encounter for other preprocedural examination: Secondary | ICD-10-CM | POA: Diagnosis not present

## 2020-10-17 DIAGNOSIS — N185 Chronic kidney disease, stage 5: Secondary | ICD-10-CM | POA: Diagnosis not present

## 2020-10-19 DIAGNOSIS — Z992 Dependence on renal dialysis: Secondary | ICD-10-CM | POA: Diagnosis not present

## 2020-10-19 DIAGNOSIS — E875 Hyperkalemia: Secondary | ICD-10-CM | POA: Diagnosis not present

## 2020-10-19 DIAGNOSIS — N2581 Secondary hyperparathyroidism of renal origin: Secondary | ICD-10-CM | POA: Diagnosis not present

## 2020-10-19 DIAGNOSIS — N186 End stage renal disease: Secondary | ICD-10-CM | POA: Diagnosis not present

## 2020-10-20 DIAGNOSIS — E875 Hyperkalemia: Secondary | ICD-10-CM | POA: Diagnosis not present

## 2020-10-20 DIAGNOSIS — N186 End stage renal disease: Secondary | ICD-10-CM | POA: Diagnosis not present

## 2020-10-20 DIAGNOSIS — N2581 Secondary hyperparathyroidism of renal origin: Secondary | ICD-10-CM | POA: Diagnosis not present

## 2020-10-20 DIAGNOSIS — Z992 Dependence on renal dialysis: Secondary | ICD-10-CM | POA: Diagnosis not present

## 2020-10-21 DIAGNOSIS — Z992 Dependence on renal dialysis: Secondary | ICD-10-CM | POA: Diagnosis not present

## 2020-10-21 DIAGNOSIS — E875 Hyperkalemia: Secondary | ICD-10-CM | POA: Diagnosis not present

## 2020-10-21 DIAGNOSIS — N2581 Secondary hyperparathyroidism of renal origin: Secondary | ICD-10-CM | POA: Diagnosis not present

## 2020-10-21 DIAGNOSIS — N186 End stage renal disease: Secondary | ICD-10-CM | POA: Diagnosis not present

## 2020-10-24 DIAGNOSIS — Z992 Dependence on renal dialysis: Secondary | ICD-10-CM | POA: Diagnosis not present

## 2020-10-24 DIAGNOSIS — N186 End stage renal disease: Secondary | ICD-10-CM | POA: Diagnosis not present

## 2020-10-24 DIAGNOSIS — N2581 Secondary hyperparathyroidism of renal origin: Secondary | ICD-10-CM | POA: Diagnosis not present

## 2020-10-24 DIAGNOSIS — E875 Hyperkalemia: Secondary | ICD-10-CM | POA: Diagnosis not present

## 2020-10-25 DIAGNOSIS — N2581 Secondary hyperparathyroidism of renal origin: Secondary | ICD-10-CM | POA: Diagnosis not present

## 2020-10-25 DIAGNOSIS — E875 Hyperkalemia: Secondary | ICD-10-CM | POA: Diagnosis not present

## 2020-10-25 DIAGNOSIS — Z992 Dependence on renal dialysis: Secondary | ICD-10-CM | POA: Diagnosis not present

## 2020-10-25 DIAGNOSIS — N186 End stage renal disease: Secondary | ICD-10-CM | POA: Diagnosis not present

## 2020-10-26 DIAGNOSIS — E1165 Type 2 diabetes mellitus with hyperglycemia: Secondary | ICD-10-CM | POA: Diagnosis not present

## 2020-10-26 DIAGNOSIS — N186 End stage renal disease: Secondary | ICD-10-CM | POA: Diagnosis not present

## 2020-10-26 DIAGNOSIS — E1122 Type 2 diabetes mellitus with diabetic chronic kidney disease: Secondary | ICD-10-CM | POA: Diagnosis not present

## 2020-10-27 DIAGNOSIS — E875 Hyperkalemia: Secondary | ICD-10-CM | POA: Diagnosis not present

## 2020-10-27 DIAGNOSIS — N2581 Secondary hyperparathyroidism of renal origin: Secondary | ICD-10-CM | POA: Diagnosis not present

## 2020-10-27 DIAGNOSIS — Z992 Dependence on renal dialysis: Secondary | ICD-10-CM | POA: Diagnosis not present

## 2020-10-27 DIAGNOSIS — E103511 Type 1 diabetes mellitus with proliferative diabetic retinopathy with macular edema, right eye: Secondary | ICD-10-CM | POA: Diagnosis not present

## 2020-10-27 DIAGNOSIS — N186 End stage renal disease: Secondary | ICD-10-CM | POA: Diagnosis not present

## 2020-10-28 DIAGNOSIS — N186 End stage renal disease: Secondary | ICD-10-CM | POA: Diagnosis not present

## 2020-10-28 DIAGNOSIS — E875 Hyperkalemia: Secondary | ICD-10-CM | POA: Diagnosis not present

## 2020-10-28 DIAGNOSIS — Z992 Dependence on renal dialysis: Secondary | ICD-10-CM | POA: Diagnosis not present

## 2020-10-28 DIAGNOSIS — N2581 Secondary hyperparathyroidism of renal origin: Secondary | ICD-10-CM | POA: Diagnosis not present

## 2020-10-31 DIAGNOSIS — N2581 Secondary hyperparathyroidism of renal origin: Secondary | ICD-10-CM | POA: Diagnosis not present

## 2020-10-31 DIAGNOSIS — E875 Hyperkalemia: Secondary | ICD-10-CM | POA: Diagnosis not present

## 2020-10-31 DIAGNOSIS — Z992 Dependence on renal dialysis: Secondary | ICD-10-CM | POA: Diagnosis not present

## 2020-10-31 DIAGNOSIS — N186 End stage renal disease: Secondary | ICD-10-CM | POA: Diagnosis not present

## 2020-11-01 ENCOUNTER — Encounter: Payer: Self-pay | Admitting: Internal Medicine

## 2020-11-01 ENCOUNTER — Other Ambulatory Visit: Payer: Self-pay

## 2020-11-01 ENCOUNTER — Ambulatory Visit: Payer: Federal, State, Local not specified - PPO | Admitting: Internal Medicine

## 2020-11-01 VITALS — BP 138/86 | HR 94 | Ht 66.0 in | Wt 216.0 lb

## 2020-11-01 DIAGNOSIS — Z992 Dependence on renal dialysis: Secondary | ICD-10-CM

## 2020-11-01 DIAGNOSIS — I10 Essential (primary) hypertension: Secondary | ICD-10-CM | POA: Diagnosis not present

## 2020-11-01 DIAGNOSIS — E875 Hyperkalemia: Secondary | ICD-10-CM | POA: Diagnosis not present

## 2020-11-01 DIAGNOSIS — N186 End stage renal disease: Secondary | ICD-10-CM | POA: Diagnosis not present

## 2020-11-01 DIAGNOSIS — E559 Vitamin D deficiency, unspecified: Secondary | ICD-10-CM

## 2020-11-01 DIAGNOSIS — E538 Deficiency of other specified B group vitamins: Secondary | ICD-10-CM

## 2020-11-01 DIAGNOSIS — N185 Chronic kidney disease, stage 5: Secondary | ICD-10-CM

## 2020-11-01 DIAGNOSIS — E785 Hyperlipidemia, unspecified: Secondary | ICD-10-CM | POA: Diagnosis not present

## 2020-11-01 DIAGNOSIS — Z1159 Encounter for screening for other viral diseases: Secondary | ICD-10-CM

## 2020-11-01 DIAGNOSIS — N2581 Secondary hyperparathyroidism of renal origin: Secondary | ICD-10-CM | POA: Diagnosis not present

## 2020-11-01 DIAGNOSIS — E1022 Type 1 diabetes mellitus with diabetic chronic kidney disease: Secondary | ICD-10-CM | POA: Diagnosis not present

## 2020-11-01 DIAGNOSIS — N320 Bladder-neck obstruction: Secondary | ICD-10-CM

## 2020-11-01 MED ORDER — AMLODIPINE BESYLATE 5 MG PO TABS: 5.0000 mg | ORAL_TABLET | Freq: Every day | ORAL | 3 refills | Status: DC

## 2020-11-01 MED ORDER — CALCIUM ACETATE (PHOS BINDER) 667 MG PO CAPS
2001.0000 mg | ORAL_CAPSULE | Freq: Three times a day (TID) | ORAL | 11 refills | Status: DC
Start: 2020-11-01 — End: 2021-04-19

## 2020-11-01 NOTE — Assessment & Plan Note (Signed)
BP Readings from Last 3 Encounters:  11/01/20 138/86  06/13/20 (!) 176/117  05/21/20 134/78   Stable, pt to continue medical treatment  - norvasc 5

## 2020-11-01 NOTE — Assessment & Plan Note (Signed)
To continue home HD, f/u renal as planned

## 2020-11-01 NOTE — Assessment & Plan Note (Signed)
No results found for: LDLCALC Stable, pt to continue current statin  - crestor 10, and check lab f/u

## 2020-11-01 NOTE — Assessment & Plan Note (Signed)
Now considering for insulin pump, but hesitates due to already having endo at [place], but now the rule is he wont lose his [facility] benefit, so willing to consider local endo

## 2020-11-01 NOTE — Patient Instructions (Addendum)
You will be contacted regarding the referral for: endocrinology  Please continue all other medications as before, and refills have been done if requested.  Please have the pharmacy call with any other refills you may need.  Please continue your efforts at being more active, low cholesterol diet, and weight control.  You are otherwise up to date with prevention measures today.  Please keep your appointments with your specialists as you may have planned - renal and HD  Please go to the LAB at the blood drawing area for the tests to be done  You will be contacted by phone if any changes need to be made immediately.  Otherwise, you will receive a letter about your results with an explanation, but please check with MyChart first.  Please remember to sign up for MyChart if you have not done so, as this will be important to you in the future with finding out test results, communicating by private email, and scheduling acute appointments online when needed.  Please make an Appointment to return in 6 months, or sooner if needed

## 2020-11-01 NOTE — Progress Notes (Signed)
Patient ID: Hector Mahoney, male   DOB: Jul 30, 1971, 50 y.o.   MRN: [id number]        Chief Complaint: follow up HTN, HLD and DM, deperession       HPI:  Hector Mahoney is a 50 y.o. male here to f/u, overall doing ok, pt is currently On HD at home Mon/Teus/Thur/Fri for approx 3 hr 20 min, also working 8 hrs per day, the also family stressors, only 5 hrs sleep per night.  Pt denies chest pain, increased sob or doe, wheezing, orthopnea, PND, increased LE swelling, palpitations, dizziness or syncope.   Pt denies polydipsia, polyuria, Denies worsening neuro s/s.   Pt denies fever, wt loss, night sweats, loss of appetite, or other constitutional symptoms  Does have ongoing depression - Seeing counseling for depression for about 7 mo, evry 2-3 weeks.  Sees endo at [facility].  Trying to get on transplant list at [facility] and [place] and also a hosp in [place], but just taken off list at [place] is concerned about BMI and a1c, now on inactive list.  Needs to work on getting BMI down and a1c.  Very difficult to do this, Has d/w Dr Lynnell Jude who per pt is in process of placing him on 30 day work leave to focus on wt and diet;  Pt now thinking also of tyring to go to the insulin pump route, since has a friend on this with a small machine and thinks he can handle this.  The sensor for the Elenor Legato seems to keep falling off so not using.  No other new compalints Wt Readings from Last 3 Encounters:  11/01/20 216 lb (98 kg)  06/13/20 217 lb (98.4 kg)  03/17/20 202 lb 6.4 oz (91.8 kg)   BP Readings from Last 3 Encounters:  11/01/20 138/86  06/13/20 (!) 176/117  05/21/20 134/78         Past Medical History:  Diagnosis Date  . ABSCESS, TOOTH 04/23/2009  . AKI (acute kidney injury) ([place]) 08/2015  . Anemia   . CHF (congestive heart failure) ([place])   . Chronic kidney disease    dialysis M-W-F  . DIABETES MELLITUS, TYPE I 07/30/2007  . Esophageal reflux 04/27/2009  . FATIGUE 04/27/2009   Resolved per patient  06/03/19, no longer a problem  . HYPERLIPIDEMIA 04/24/2007  . HYPERTENSION 04/24/2007  . INSOMNIA-SLEEP DISORDER-UNSPEC 04/27/2009   resolved, no longer a problem per patient 06/03/19  . LUMBAR STRAIN, ACUTE 12/11/2008  . RASH-NONVESICULAR 03/11/2008   Past Surgical History:  Procedure Laterality Date  . AV FISTULA PLACEMENT Left 06/04/2019   Procedure: BRACHIAL-CEPHALIC ARTERIOVENOUS (AV) FISTULA CREATION LEFT ARM;  Surgeon: Rosetta Posner, MD;  Location: [facility] OR;  Service: Vascular;  Laterality: Left;  . COLONOSCOPY     polyp  . IR FLUORO GUIDE CV LINE RIGHT  10/22/2017  . IR US GUIDE VASC ACCESS RIGHT  10/22/2017  . MOUTH SURGERY     tooth ext    reports that he quit smoking about 5 years ago. His smoking use included cigarettes. He has never used smokeless tobacco. He reports current alcohol use. He reports that he does not use drugs. family history includes Colon cancer in his maternal uncle; Colon polyps in his maternal uncle; Diabetes in his maternal aunt and mother; Heart disease in his maternal uncle; Hypertension in his mother; Lung cancer in his maternal grandfather. No Known Allergies Current Outpatient Medications on File Prior to Visit  Medication Sig Dispense Refill  .  acetaminophen (TYLENOL) 500 MG tablet Take by mouth.    Marland Kitchen aspirin EC 81 MG EC tablet Take 1 tablet (81 mg total) by mouth daily. 30 tablet 0  . atorvastatin (LIPITOR) 80 MG tablet TAKE ONE TABLET BY MOUTH AT BEDTIME FOR CHOLESTEROL    . calcitRIOL (ROCALTROL) 0.25 MCG capsule Take 1 capsule (0.25 mcg total) by mouth every Monday, Wednesday, and Friday at 6 PM. 15 capsule 0  . cinacalcet (SENSIPAR) 30 MG tablet Take 60 mg by mouth every evening.    . Continuous Blood Gluc Sensor (FREESTYLE LIBRE 14 DAY SENSOR) MISC USE 1 SENSOR MISCELLANEOUS AS DIRECTED FOR GLUCOSE MONITORING    . gabapentin (NEURONTIN) 300 MG capsule 1-2 tab by mouth at bedtime as needed 180 capsule 1  . glycopyrrolate (ROBINUL) 2 MG tablet Take 1  tablet (2 mg total) by mouth 2 (two) times daily. 60 tablet 11  . heparin 1000 unit/mL SOLN injection Heparin Sodium (Porcine) 1,000 Units/mL Systemic    . heparin 1000 unit/mL SOLN injection Heparin Sodium (Porcine) 1,000 Units/mL Systemic    . insulin aspart (NOVOLOG) 100 UNIT/ML injection INJECT 8 TO 12 UNITS INTO THE SKIN THREE TIMES DAILY WITH MEALS 10 mL 0  . insulin glargine (LANTUS) 100 UNIT/ML injection Inject 0.25 mLs (25 Units total) into the skin at bedtime. 20 mL 5  . levETIRAcetam (KEPPRA) 250 MG tablet Take 1 tablet (250 mg total) by mouth at bedtime. 30 tablet 5  . meclizine (ANTIVERT) 12.5 MG tablet TAKE 1 TABLET BY MOUTH THREE TIMES DAILY AS NEEDED FOR DIZZINESS 40 tablet 0  . Methoxy PEG-Epoetin Beta (MIRCERA IJ) Mircera    . multivitamin (RENA-VIT) TABS tablet Take by mouth.    Marland Kitchen omeprazole (PRILOSEC) 40 MG capsule Take 1 capsule (40 mg total) by mouth 2 (two) times daily. 60 capsule 11  . polyethylene glycol powder (GLYCOLAX/MIRALAX) 17 GM/SCOOP powder Take by mouth.    Marland Kitchen rOPINIRole (REQUIP) 1 MG tablet Take by mouth.    . rosuvastatin (CRESTOR) 10 MG tablet Take 1 tablet (10 mg total) by mouth daily. 30 tablet 3  . Tdap (ADACEL) 12-12-13.5 LF-MCG/0.5 injection Inject into the muscle.    . triamcinolone (NASACORT) 55 MCG/ACT AERO nasal inhaler Place 2 sprays into the nose daily. 1 each 0  . Tuberculin PPD (TUBERSOL ID) Inject into the skin.     Marland Kitchen VITAMIN D PO Take by mouth.    . fexofenadine (ALLEGRA) 180 MG tablet Take 1 tablet (180 mg total) by mouth daily. (Patient not taking: Reported on 11/01/2020) 90 tablet 3   No current facility-administered medications on file prior to visit.        ROS:  All others reviewed and negative.  Objective        PE:  BP 138/86   Pulse 94   Ht 5\' 6"  (1.676 m)   Wt 216 lb (98 kg)   SpO2 98%   BMI 34.86 kg/m                 Constitutional: Pt appears in NAD               HENT: Head: NCAT.                Right Ear: External ear  normal.                 Left Ear: External ear normal.  Eyes: . Pupils are equal, round, and reactive to light. Conjunctivae and EOM are normal               Nose: without d/c or deformity               Neck: Neck supple. Gross normal ROM               Cardiovascular: Normal rate and regular rhythm.                 Pulmonary/Chest: Effort normal and breath sounds without rales or wheezing.                Abd:  Soft, NT, ND, + BS, no organomegaly               Neurological: Pt is alert. At baseline orientation, motor grossly intact               Skin: Skin is warm. No rashes, no other new lesions, LE edema - none               Psychiatric: Pt behavior is normal without agitation   Micro: none  Cardiac tracings I have personally interpreted today:  none  Pertinent Radiological findings (summarize): none   Lab Results  Component Value Date   WBC 8.2 06/25/2019   HGB 12.3 (L) 06/25/2019   HCT 37.5 (L) 06/25/2019   PLT 261.0 06/25/2019   GLUCOSE 173 (H) 06/25/2019   CHOL 183 06/25/2019   TRIG 214.0 (H) 06/25/2019   HDL 58.40 06/25/2019   LDLDIRECT 80.0 06/25/2019   ALT 15 06/25/2019   AST 18 06/25/2019   NA 141 06/25/2019   K 4.3 06/25/2019   CL 97 06/25/2019   CREATININE 8.83 (HH) 06/25/2019   BUN 51 (H) 06/25/2019   CO2 29 06/25/2019   TSH 2.03 06/25/2019   PSA 1.39 06/25/2019   INR 1.05 10/21/2017   HGBA1C 9.3 (H) 06/25/2019   Assessment/Plan:  Bueford L Gillespie is a 50 y.o. Black or African American [2] male with  has a past medical history of ABSCESS, TOOTH (04/23/2009), AKI (acute kidney injury) ([place]) (08/2015), Anemia, CHF (congestive heart failure) ([place]), Chronic kidney disease, DIABETES MELLITUS, TYPE I (07/30/2007), Esophageal reflux (04/27/2009), FATIGUE (04/27/2009), HYPERLIPIDEMIA (04/24/2007), HYPERTENSION (04/24/2007), INSOMNIA-SLEEP DISORDER-UNSPEC (04/27/2009), LUMBAR STRAIN, ACUTE (12/11/2008), and RASH-NONVESICULAR (03/11/2008).  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  Cathlean Cower, MD 11/07/2020 5:32 AM [facility]

## 2020-11-03 DIAGNOSIS — E875 Hyperkalemia: Secondary | ICD-10-CM | POA: Diagnosis not present

## 2020-11-03 DIAGNOSIS — N186 End stage renal disease: Secondary | ICD-10-CM | POA: Diagnosis not present

## 2020-11-03 DIAGNOSIS — Z992 Dependence on renal dialysis: Secondary | ICD-10-CM | POA: Diagnosis not present

## 2020-11-03 DIAGNOSIS — N2581 Secondary hyperparathyroidism of renal origin: Secondary | ICD-10-CM | POA: Diagnosis not present

## 2020-11-04 DIAGNOSIS — N2581 Secondary hyperparathyroidism of renal origin: Secondary | ICD-10-CM | POA: Diagnosis not present

## 2020-11-04 DIAGNOSIS — N186 End stage renal disease: Secondary | ICD-10-CM | POA: Diagnosis not present

## 2020-11-04 DIAGNOSIS — Z992 Dependence on renal dialysis: Secondary | ICD-10-CM | POA: Diagnosis not present

## 2020-11-04 DIAGNOSIS — E875 Hyperkalemia: Secondary | ICD-10-CM | POA: Diagnosis not present

## 2020-11-06 DIAGNOSIS — Z01810 Encounter for preprocedural cardiovascular examination: Secondary | ICD-10-CM | POA: Diagnosis not present

## 2020-11-06 DIAGNOSIS — Z992 Dependence on renal dialysis: Secondary | ICD-10-CM | POA: Diagnosis not present

## 2020-11-06 DIAGNOSIS — E1122 Type 2 diabetes mellitus with diabetic chronic kidney disease: Secondary | ICD-10-CM | POA: Diagnosis not present

## 2020-11-06 DIAGNOSIS — I120 Hypertensive chronic kidney disease with stage 5 chronic kidney disease or end stage renal disease: Secondary | ICD-10-CM | POA: Diagnosis not present

## 2020-11-06 DIAGNOSIS — N186 End stage renal disease: Secondary | ICD-10-CM | POA: Diagnosis not present

## 2020-11-06 DIAGNOSIS — Z794 Long term (current) use of insulin: Secondary | ICD-10-CM | POA: Diagnosis not present

## 2020-11-06 DIAGNOSIS — Z20822 Contact with and (suspected) exposure to covid-19: Secondary | ICD-10-CM | POA: Diagnosis not present

## 2020-11-07 ENCOUNTER — Encounter: Payer: Self-pay | Admitting: Internal Medicine

## 2020-11-07 DIAGNOSIS — N186 End stage renal disease: Secondary | ICD-10-CM | POA: Diagnosis not present

## 2020-11-07 DIAGNOSIS — E875 Hyperkalemia: Secondary | ICD-10-CM | POA: Diagnosis not present

## 2020-11-07 DIAGNOSIS — N2581 Secondary hyperparathyroidism of renal origin: Secondary | ICD-10-CM | POA: Diagnosis not present

## 2020-11-07 DIAGNOSIS — Z992 Dependence on renal dialysis: Secondary | ICD-10-CM | POA: Diagnosis not present

## 2020-11-08 DIAGNOSIS — E875 Hyperkalemia: Secondary | ICD-10-CM | POA: Diagnosis not present

## 2020-11-08 DIAGNOSIS — N2581 Secondary hyperparathyroidism of renal origin: Secondary | ICD-10-CM | POA: Diagnosis not present

## 2020-11-08 DIAGNOSIS — Z992 Dependence on renal dialysis: Secondary | ICD-10-CM | POA: Diagnosis not present

## 2020-11-08 DIAGNOSIS — N186 End stage renal disease: Secondary | ICD-10-CM | POA: Diagnosis not present

## 2020-11-09 ENCOUNTER — Ambulatory Visit (INDEPENDENT_AMBULATORY_CARE_PROVIDER_SITE_OTHER): Payer: Self-pay | Admitting: Bariatrics

## 2020-11-09 DIAGNOSIS — Z992 Dependence on renal dialysis: Secondary | ICD-10-CM | POA: Diagnosis not present

## 2020-11-09 DIAGNOSIS — Z01810 Encounter for preprocedural cardiovascular examination: Secondary | ICD-10-CM | POA: Diagnosis not present

## 2020-11-09 DIAGNOSIS — N186 End stage renal disease: Secondary | ICD-10-CM | POA: Diagnosis not present

## 2020-11-09 DIAGNOSIS — Z794 Long term (current) use of insulin: Secondary | ICD-10-CM | POA: Diagnosis not present

## 2020-11-09 DIAGNOSIS — E1122 Type 2 diabetes mellitus with diabetic chronic kidney disease: Secondary | ICD-10-CM | POA: Diagnosis not present

## 2020-11-09 DIAGNOSIS — Z20822 Contact with and (suspected) exposure to covid-19: Secondary | ICD-10-CM | POA: Diagnosis not present

## 2020-11-09 DIAGNOSIS — I120 Hypertensive chronic kidney disease with stage 5 chronic kidney disease or end stage renal disease: Secondary | ICD-10-CM | POA: Diagnosis not present

## 2020-11-10 DIAGNOSIS — Z992 Dependence on renal dialysis: Secondary | ICD-10-CM | POA: Diagnosis not present

## 2020-11-10 DIAGNOSIS — E1022 Type 1 diabetes mellitus with diabetic chronic kidney disease: Secondary | ICD-10-CM | POA: Diagnosis not present

## 2020-11-10 DIAGNOSIS — N186 End stage renal disease: Secondary | ICD-10-CM | POA: Diagnosis not present

## 2020-11-11 DIAGNOSIS — N2581 Secondary hyperparathyroidism of renal origin: Secondary | ICD-10-CM | POA: Diagnosis not present

## 2020-11-11 DIAGNOSIS — N186 End stage renal disease: Secondary | ICD-10-CM | POA: Diagnosis not present

## 2020-11-11 DIAGNOSIS — Z992 Dependence on renal dialysis: Secondary | ICD-10-CM | POA: Diagnosis not present

## 2020-11-11 DIAGNOSIS — E875 Hyperkalemia: Secondary | ICD-10-CM | POA: Diagnosis not present

## 2020-11-12 DIAGNOSIS — N2581 Secondary hyperparathyroidism of renal origin: Secondary | ICD-10-CM | POA: Diagnosis not present

## 2020-11-12 DIAGNOSIS — E875 Hyperkalemia: Secondary | ICD-10-CM | POA: Diagnosis not present

## 2020-11-12 DIAGNOSIS — N186 End stage renal disease: Secondary | ICD-10-CM | POA: Diagnosis not present

## 2020-11-12 DIAGNOSIS — Z992 Dependence on renal dialysis: Secondary | ICD-10-CM | POA: Diagnosis not present

## 2020-11-14 DIAGNOSIS — E875 Hyperkalemia: Secondary | ICD-10-CM | POA: Diagnosis not present

## 2020-11-14 DIAGNOSIS — N2581 Secondary hyperparathyroidism of renal origin: Secondary | ICD-10-CM | POA: Diagnosis not present

## 2020-11-14 DIAGNOSIS — N186 End stage renal disease: Secondary | ICD-10-CM | POA: Diagnosis not present

## 2020-11-14 DIAGNOSIS — Z992 Dependence on renal dialysis: Secondary | ICD-10-CM | POA: Diagnosis not present

## 2020-11-15 ENCOUNTER — Ambulatory Visit: Payer: Federal, State, Local not specified - PPO | Admitting: Cardiovascular Disease

## 2020-11-15 NOTE — Progress Notes (Incomplete)
Cardiology Office Note   Date:  11/15/2020   ID:  Hector Mahoney 05-26-1971, MRN [id number]  PCP:  Hector Borg, MD  Cardiologist:   Hector Mahoney   No chief complaint on file.     History of Present Illness: Hector Mahoney is a 50 y.o. male with chronic diastolic heart failure, hypertension, hyperlipidemia, DM (A1c 11.4% 01/02/13) here for follow up.  Hector Mahoney was initially seen 09/2016 with acute on chronic diastolic heart failure.  Hector Mahoney was diagnosed with grade 3 diastolic dysfunction in 0240.  Since that time he has been seen in the ED several times for heart failure exacerbations.  Lately he reports that his BP has been well-controlled.  However at that appointment his blood pressure was elevated, as he had not yet taken his medication that day. He also reported lower extremity edema but had not been taking Lasix because he felt to be dehydrated him. At his last appointment the dose was reduced to 20 mg and he was asked to start taking it Monday, Wednesday, and Friday.  Hector Mahoney was last seen in 2018 and reported lable blood pressure and Amolodapine was reduced to 2.5 mg. [place] then he had a nuclear stress test 01/31/19 that was low risk and an Echo in 20/19 with normal EF and grade 2 dystolic dysfunction. Since then he has started on home HD and is struggling with blood sugars.   Past Medical History:  Diagnosis Date  . ABSCESS, TOOTH 04/23/2009  . AKI (acute kidney injury) ([place]) 08/2015  . Anemia   . CHF (congestive heart failure) ([place])   . Chronic kidney disease    dialysis M-W-F  . DIABETES MELLITUS, TYPE I 07/30/2007  . Esophageal reflux 04/27/2009  . FATIGUE 04/27/2009   Resolved per patient 06/03/19, no longer a problem  . HYPERLIPIDEMIA 04/24/2007  . HYPERTENSION 04/24/2007  . INSOMNIA-SLEEP DISORDER-UNSPEC 04/27/2009   resolved, no longer a problem per patient 06/03/19  . LUMBAR STRAIN, ACUTE 12/11/2008  . RASH-NONVESICULAR 03/11/2008    Past  Surgical History:  Procedure Laterality Date  . AV FISTULA PLACEMENT Left 06/04/2019   Procedure: BRACHIAL-CEPHALIC ARTERIOVENOUS (AV) FISTULA CREATION LEFT ARM;  Surgeon: Rosetta Posner, MD;  Location: [facility] OR;  Service: Vascular;  Laterality: Left;  . COLONOSCOPY     polyp  . IR FLUORO GUIDE CV LINE RIGHT  10/22/2017  . IR US GUIDE VASC ACCESS RIGHT  10/22/2017  . MOUTH SURGERY     tooth ext     Current Outpatient Medications  Medication Sig Dispense Refill  . acetaminophen (TYLENOL) 500 MG tablet Take by mouth.    Marland Kitchen amLODipine (NORVASC) 5 MG tablet Take 1 tablet (5 mg total) by mouth at bedtime. 90 tablet 3  . aspirin EC 81 MG EC tablet Take 1 tablet (81 mg total) by mouth daily. 30 tablet 0  . atorvastatin (LIPITOR) 80 MG tablet TAKE ONE TABLET BY MOUTH AT BEDTIME FOR CHOLESTEROL    . calcitRIOL (ROCALTROL) 0.25 MCG capsule Take 1 capsule (0.25 mcg total) by mouth every Monday, Wednesday, and Friday at 6 PM. 15 capsule 0  . calcium acetate (PHOSLO) 667 MG capsule Take 3 capsules (2,001 mg total) by mouth 3 (three) times daily with meals. 90 capsule 11  . cinacalcet (SENSIPAR) 30 MG tablet Take 60 mg by mouth every evening.    . Continuous Blood Gluc Sensor (FREESTYLE LIBRE 14 DAY SENSOR) MISC USE 1 SENSOR MISCELLANEOUS AS DIRECTED FOR GLUCOSE  MONITORING    . fexofenadine (ALLEGRA) 180 MG tablet Take 1 tablet (180 mg total) by mouth daily. (Patient not taking: Reported on 11/01/2020) 90 tablet 3  . gabapentin (NEURONTIN) 300 MG capsule 1-2 tab by mouth at bedtime as needed 180 capsule 1  . glycopyrrolate (ROBINUL) 2 MG tablet Take 1 tablet (2 mg total) by mouth 2 (two) times daily. 60 tablet 11  . heparin 1000 unit/mL SOLN injection Heparin Sodium (Porcine) 1,000 Units/mL Systemic    . heparin 1000 unit/mL SOLN injection Heparin Sodium (Porcine) 1,000 Units/mL Systemic    . insulin aspart (NOVOLOG) 100 UNIT/ML injection INJECT 8 TO 12 UNITS INTO THE SKIN THREE TIMES DAILY WITH MEALS 10 mL 0   . insulin glargine (LANTUS) 100 UNIT/ML injection Inject 0.25 mLs (25 Units total) into the skin at bedtime. 20 mL 5  . levETIRAcetam (KEPPRA) 250 MG tablet Take 1 tablet (250 mg total) by mouth at bedtime. 30 tablet 5  . meclizine (ANTIVERT) 12.5 MG tablet TAKE 1 TABLET BY MOUTH THREE TIMES DAILY AS NEEDED FOR DIZZINESS 40 tablet 0  . Methoxy PEG-Epoetin Beta (MIRCERA IJ) Mircera    . multivitamin (RENA-VIT) TABS tablet Take by mouth.    Marland Kitchen omeprazole (PRILOSEC) 40 MG capsule Take 1 capsule (40 mg total) by mouth 2 (two) times daily. 60 capsule 11  . polyethylene glycol powder (GLYCOLAX/MIRALAX) 17 GM/SCOOP powder Take by mouth.    Marland Kitchen rOPINIRole (REQUIP) 1 MG tablet Take by mouth.    . rosuvastatin (CRESTOR) 10 MG tablet Take 1 tablet (10 mg total) by mouth daily. 30 tablet 3  . Tdap (ADACEL) 12-12-13.5 LF-MCG/0.5 injection Inject into the muscle.    . triamcinolone (NASACORT) 55 MCG/ACT AERO nasal inhaler Place 2 sprays into the nose daily. 1 each 0  . Tuberculin PPD (TUBERSOL ID) Inject into the skin.     Marland Kitchen VITAMIN D PO Take by mouth.     No current facility-administered medications for this visit.    Allergies:   Patient has no known allergies.    Social History:  The patient  reports that he quit smoking about 5 years ago. His smoking use included cigarettes. He has never used smokeless tobacco. He reports current alcohol use. He reports that he does not use drugs.   Family History:  The patient's family history includes Colon cancer in his maternal uncle; Colon polyps in his maternal uncle; Diabetes in his maternal aunt and mother; Heart disease in his maternal uncle; Hypertension in his mother; Lung cancer in his maternal grandfather.    ROS:  Please see the history of present illness.   Otherwise, review of systems are positive for none.   All other systems are reviewed and negative.    PHYSICAL EXAM: VS:  There were no vitals taken for this visit. , BMI There is no height or  weight on file to calculate BMI. GENERAL:  Well appearing.  No acute distress. HEENT:  Pupils equal round and reactive, fundi not visualized, oral mucosa unremarkable NECK:  No JVD.  Waveform within normal limits, carotid upstroke brisk and symmetric, no bruits LYMPHATICS:  No cervical adenopathy LUNGS:  Clear to auscultation bilaterally HEART:  RRR.  PMI not displaced or sustained,S1 and S2 within normal limits, no S3, no S4, no clicks, no rubs, no murmurs ABD:  Flat, positive bowel sounds normal in frequency in pitch, no bruits, no rebound, no guarding, no midline pulsatile mass, no hepatomegaly, no splenomegaly EXT:  2 plus pulses throughout,  no edema, no cyanosis no clubbing SKIN:  No rashes no nodules NEURO:  Cranial nerves II through XII grossly intact, motor grossly intact throughout PSYCH:  Cognitively intact, oriented to person place and time   EKG:  EKG is not ordered today. 04/13/15: Sinus rhythm at 94 bpm.  L axis deviation.  LVH with early repolarization. 09/27/16: Sinus rhythm. Rate 100 bpm. LAFB. LVH with repolarization laterality.  TTE 12/22/12: LVEF 50-55%, moderate LVH.  Mild anterior, lateral, inferior and apical hypokinesis.  Grade 3 diastolic dysfunction.    Echo 08/15/15: Study Conclusions  - Left ventricle: The cavity size was normal. Wall thickness was   normal. Systolic function was normal. The estimated ejection   fraction was in the range of 50% to 55%. Features are consistent   with a pseudonormal left ventricular filling pattern, with   concomitant abnormal relaxation and increased filling pressure   (grade 2 diastolic dysfunction).  Impressions:  - Abnormal global LV longitudinal strain -11.1%  Echo-10/02/17 Study Conclusions   - Left ventricle: The cavity size was normal. There was severe  concentric hypertrophy. Systolic function was normal. The  estimated ejection fraction was in the range of 60% to 65%. Wall  motion was normal; there were  no regional wall motion  abnormalities. Doppler parameters are consistent with  pseudonormal left ventricular relaxation (grade 2 diastolic  dysfunction). The E/e&' ratio is >15, suggesting elevated LV  filling pressure.  - Mitral valve: Mildly thickened leaflets . There was mild  regurgitation.  - Left atrium: Mildly dilated.  - Inferior vena cava: The vessel was normal in size. The  respirophasic diameter changes were in the normal range (>= 50%),  consistent with normal central venous pressure.   Recent Labs: No results found for requested labs within last 8760 hours.    Lipid Panel    Component Value Date/Time   CHOL 183 06/25/2019 1551   TRIG 214.0 (H) 06/25/2019 1551   HDL 58.40 06/25/2019 1551   CHOLHDL 3 06/25/2019 1551   VLDL 42.8 (H) 06/25/2019 1551   LDLDIRECT 80.0 06/25/2019 1551      Wt Readings from Last 3 Encounters:  11/01/20 216 lb (98 kg)  06/13/20 217 lb (98.4 kg)  03/17/20 202 lb 6.4 oz (91.8 kg)      Other studies Reviewed: Additional studies/ records that were reviewed today include: . Review of the above records demonstrates:  Please see elsewhere in the note.     ASSESSMENT AND PLAN: # Hypertension: BP poorly-controlled. Amlodipine was discontinued due to low blood pressures.  Now it is elevated again.  We will start amlodipine 2.5 mg daily instead of 10 mg.  Continue carvedilol and Imdur.  I have asked him to keep a log of his blood pressures.   # Chronic diastolic heart failure: Hector. Mahoney is euvolemic.  He has a good response to extra lasix as needed.  Blood pressure is poorly-controlled.  We will add amlodipine as above.  # Hyperlipidemia: He is no longer taking atorvastatin and does not know why. He was started on ezetimibe by nephrology.  Would prefer that he be on a statin given his diabetes.  We will stop Zetia and start rosuvastatin 20 mg daily.  Check fasting lipids and CMP in 6 weeks.  # CV disease prevention: Continue  aspirin 81mg  daily and starting rosuvastatin.  # Restless legs: Refill ropinirole.  Current medicines are reviewed at length with the patient today.  The patient has concerns regarding medicines.  The following  changes have been made:  Restart amlodipine 2.5mg  daily.  Stop ezetimibe and start rosuvastatin 20 mg daily.   Labs/ tests ordered today include:   No orders of the defined types were placed in this encounter.    Disposition:   FU with Dr. Jonelle Sidle C. [place] in 6-8 weeks  . Bridgett Larsson  11/15/2020 8:22 AM    [facility]

## 2020-11-17 DIAGNOSIS — Z992 Dependence on renal dialysis: Secondary | ICD-10-CM | POA: Diagnosis not present

## 2020-11-17 DIAGNOSIS — E875 Hyperkalemia: Secondary | ICD-10-CM | POA: Diagnosis not present

## 2020-11-17 DIAGNOSIS — N2581 Secondary hyperparathyroidism of renal origin: Secondary | ICD-10-CM | POA: Diagnosis not present

## 2020-11-17 DIAGNOSIS — N186 End stage renal disease: Secondary | ICD-10-CM | POA: Diagnosis not present

## 2020-11-18 DIAGNOSIS — N2581 Secondary hyperparathyroidism of renal origin: Secondary | ICD-10-CM | POA: Diagnosis not present

## 2020-11-18 DIAGNOSIS — E875 Hyperkalemia: Secondary | ICD-10-CM | POA: Diagnosis not present

## 2020-11-18 DIAGNOSIS — N186 End stage renal disease: Secondary | ICD-10-CM | POA: Diagnosis not present

## 2020-11-18 DIAGNOSIS — Z992 Dependence on renal dialysis: Secondary | ICD-10-CM | POA: Diagnosis not present

## 2020-11-19 DIAGNOSIS — E875 Hyperkalemia: Secondary | ICD-10-CM | POA: Diagnosis not present

## 2020-11-19 DIAGNOSIS — N2581 Secondary hyperparathyroidism of renal origin: Secondary | ICD-10-CM | POA: Diagnosis not present

## 2020-11-19 DIAGNOSIS — N186 End stage renal disease: Secondary | ICD-10-CM | POA: Diagnosis not present

## 2020-11-19 DIAGNOSIS — Z992 Dependence on renal dialysis: Secondary | ICD-10-CM | POA: Diagnosis not present

## 2020-11-21 DIAGNOSIS — N2581 Secondary hyperparathyroidism of renal origin: Secondary | ICD-10-CM | POA: Diagnosis not present

## 2020-11-21 DIAGNOSIS — Z992 Dependence on renal dialysis: Secondary | ICD-10-CM | POA: Diagnosis not present

## 2020-11-21 DIAGNOSIS — E875 Hyperkalemia: Secondary | ICD-10-CM | POA: Diagnosis not present

## 2020-11-21 DIAGNOSIS — N186 End stage renal disease: Secondary | ICD-10-CM | POA: Diagnosis not present

## 2020-11-22 ENCOUNTER — Ambulatory Visit (INDEPENDENT_AMBULATORY_CARE_PROVIDER_SITE_OTHER): Payer: Self-pay | Admitting: Family Medicine

## 2020-11-22 DIAGNOSIS — N2581 Secondary hyperparathyroidism of renal origin: Secondary | ICD-10-CM | POA: Diagnosis not present

## 2020-11-22 DIAGNOSIS — E875 Hyperkalemia: Secondary | ICD-10-CM | POA: Diagnosis not present

## 2020-11-22 DIAGNOSIS — N186 End stage renal disease: Secondary | ICD-10-CM | POA: Diagnosis not present

## 2020-11-22 DIAGNOSIS — Z992 Dependence on renal dialysis: Secondary | ICD-10-CM | POA: Diagnosis not present

## 2020-11-23 ENCOUNTER — Ambulatory Visit (INDEPENDENT_AMBULATORY_CARE_PROVIDER_SITE_OTHER): Payer: Self-pay | Admitting: Bariatrics

## 2020-11-24 DIAGNOSIS — Z992 Dependence on renal dialysis: Secondary | ICD-10-CM | POA: Diagnosis not present

## 2020-11-24 DIAGNOSIS — N186 End stage renal disease: Secondary | ICD-10-CM | POA: Diagnosis not present

## 2020-11-24 DIAGNOSIS — N2581 Secondary hyperparathyroidism of renal origin: Secondary | ICD-10-CM | POA: Diagnosis not present

## 2020-11-24 DIAGNOSIS — E875 Hyperkalemia: Secondary | ICD-10-CM | POA: Diagnosis not present

## 2020-11-26 DIAGNOSIS — Z992 Dependence on renal dialysis: Secondary | ICD-10-CM | POA: Diagnosis not present

## 2020-11-26 DIAGNOSIS — N2581 Secondary hyperparathyroidism of renal origin: Secondary | ICD-10-CM | POA: Diagnosis not present

## 2020-11-26 DIAGNOSIS — N186 End stage renal disease: Secondary | ICD-10-CM | POA: Diagnosis not present

## 2020-11-26 DIAGNOSIS — E875 Hyperkalemia: Secondary | ICD-10-CM | POA: Diagnosis not present

## 2020-11-28 DIAGNOSIS — N2581 Secondary hyperparathyroidism of renal origin: Secondary | ICD-10-CM | POA: Diagnosis not present

## 2020-11-28 DIAGNOSIS — Z992 Dependence on renal dialysis: Secondary | ICD-10-CM | POA: Diagnosis not present

## 2020-11-28 DIAGNOSIS — N186 End stage renal disease: Secondary | ICD-10-CM | POA: Diagnosis not present

## 2020-11-28 DIAGNOSIS — E875 Hyperkalemia: Secondary | ICD-10-CM | POA: Diagnosis not present

## 2020-11-29 DIAGNOSIS — Z992 Dependence on renal dialysis: Secondary | ICD-10-CM | POA: Diagnosis not present

## 2020-11-29 DIAGNOSIS — E875 Hyperkalemia: Secondary | ICD-10-CM | POA: Diagnosis not present

## 2020-11-29 DIAGNOSIS — N2581 Secondary hyperparathyroidism of renal origin: Secondary | ICD-10-CM | POA: Diagnosis not present

## 2020-11-29 DIAGNOSIS — N186 End stage renal disease: Secondary | ICD-10-CM | POA: Diagnosis not present

## 2020-11-30 DIAGNOSIS — F4321 Adjustment disorder with depressed mood: Secondary | ICD-10-CM | POA: Diagnosis not present

## 2020-12-01 DIAGNOSIS — N186 End stage renal disease: Secondary | ICD-10-CM | POA: Diagnosis not present

## 2020-12-01 DIAGNOSIS — N2581 Secondary hyperparathyroidism of renal origin: Secondary | ICD-10-CM | POA: Diagnosis not present

## 2020-12-01 DIAGNOSIS — E875 Hyperkalemia: Secondary | ICD-10-CM | POA: Diagnosis not present

## 2020-12-01 DIAGNOSIS — Z992 Dependence on renal dialysis: Secondary | ICD-10-CM | POA: Diagnosis not present

## 2020-12-03 DIAGNOSIS — N2581 Secondary hyperparathyroidism of renal origin: Secondary | ICD-10-CM | POA: Diagnosis not present

## 2020-12-03 DIAGNOSIS — E875 Hyperkalemia: Secondary | ICD-10-CM | POA: Diagnosis not present

## 2020-12-03 DIAGNOSIS — Z992 Dependence on renal dialysis: Secondary | ICD-10-CM | POA: Diagnosis not present

## 2020-12-03 DIAGNOSIS — N186 End stage renal disease: Secondary | ICD-10-CM | POA: Diagnosis not present

## 2020-12-04 DIAGNOSIS — N186 End stage renal disease: Secondary | ICD-10-CM | POA: Diagnosis not present

## 2020-12-04 DIAGNOSIS — E875 Hyperkalemia: Secondary | ICD-10-CM | POA: Diagnosis not present

## 2020-12-04 DIAGNOSIS — Z992 Dependence on renal dialysis: Secondary | ICD-10-CM | POA: Diagnosis not present

## 2020-12-04 DIAGNOSIS — N2581 Secondary hyperparathyroidism of renal origin: Secondary | ICD-10-CM | POA: Diagnosis not present

## 2020-12-05 DIAGNOSIS — Z0189 Encounter for other specified special examinations: Secondary | ICD-10-CM | POA: Diagnosis not present

## 2020-12-06 ENCOUNTER — Ambulatory Visit (INDEPENDENT_AMBULATORY_CARE_PROVIDER_SITE_OTHER): Payer: Self-pay | Admitting: Family Medicine

## 2020-12-06 DIAGNOSIS — Z992 Dependence on renal dialysis: Secondary | ICD-10-CM | POA: Diagnosis not present

## 2020-12-06 DIAGNOSIS — E875 Hyperkalemia: Secondary | ICD-10-CM | POA: Diagnosis not present

## 2020-12-06 DIAGNOSIS — N186 End stage renal disease: Secondary | ICD-10-CM | POA: Diagnosis not present

## 2020-12-06 DIAGNOSIS — N2581 Secondary hyperparathyroidism of renal origin: Secondary | ICD-10-CM | POA: Diagnosis not present

## 2020-12-08 DIAGNOSIS — N186 End stage renal disease: Secondary | ICD-10-CM | POA: Diagnosis not present

## 2020-12-08 DIAGNOSIS — E875 Hyperkalemia: Secondary | ICD-10-CM | POA: Diagnosis not present

## 2020-12-08 DIAGNOSIS — N2581 Secondary hyperparathyroidism of renal origin: Secondary | ICD-10-CM | POA: Diagnosis not present

## 2020-12-08 DIAGNOSIS — Z992 Dependence on renal dialysis: Secondary | ICD-10-CM | POA: Diagnosis not present

## 2020-12-09 DIAGNOSIS — N2581 Secondary hyperparathyroidism of renal origin: Secondary | ICD-10-CM | POA: Diagnosis not present

## 2020-12-09 DIAGNOSIS — N186 End stage renal disease: Secondary | ICD-10-CM | POA: Diagnosis not present

## 2020-12-09 DIAGNOSIS — Z992 Dependence on renal dialysis: Secondary | ICD-10-CM | POA: Diagnosis not present

## 2020-12-09 DIAGNOSIS — E875 Hyperkalemia: Secondary | ICD-10-CM | POA: Diagnosis not present

## 2020-12-10 DIAGNOSIS — Z992 Dependence on renal dialysis: Secondary | ICD-10-CM | POA: Diagnosis not present

## 2020-12-10 DIAGNOSIS — N186 End stage renal disease: Secondary | ICD-10-CM | POA: Diagnosis not present

## 2020-12-10 DIAGNOSIS — E1022 Type 1 diabetes mellitus with diabetic chronic kidney disease: Secondary | ICD-10-CM | POA: Diagnosis not present

## 2020-12-12 DIAGNOSIS — N186 End stage renal disease: Secondary | ICD-10-CM | POA: Diagnosis not present

## 2020-12-12 DIAGNOSIS — N2581 Secondary hyperparathyroidism of renal origin: Secondary | ICD-10-CM | POA: Diagnosis not present

## 2020-12-12 DIAGNOSIS — E875 Hyperkalemia: Secondary | ICD-10-CM | POA: Diagnosis not present

## 2020-12-12 DIAGNOSIS — Z992 Dependence on renal dialysis: Secondary | ICD-10-CM | POA: Diagnosis not present

## 2020-12-13 DIAGNOSIS — N186 End stage renal disease: Secondary | ICD-10-CM | POA: Diagnosis not present

## 2020-12-13 DIAGNOSIS — E875 Hyperkalemia: Secondary | ICD-10-CM | POA: Diagnosis not present

## 2020-12-13 DIAGNOSIS — N2581 Secondary hyperparathyroidism of renal origin: Secondary | ICD-10-CM | POA: Diagnosis not present

## 2020-12-13 DIAGNOSIS — Z992 Dependence on renal dialysis: Secondary | ICD-10-CM | POA: Diagnosis not present

## 2020-12-14 DIAGNOSIS — Z20822 Contact with and (suspected) exposure to covid-19: Secondary | ICD-10-CM | POA: Diagnosis not present

## 2020-12-14 DIAGNOSIS — R059 Cough, unspecified: Secondary | ICD-10-CM | POA: Diagnosis not present

## 2020-12-15 DIAGNOSIS — N186 End stage renal disease: Secondary | ICD-10-CM | POA: Diagnosis not present

## 2020-12-15 DIAGNOSIS — Z992 Dependence on renal dialysis: Secondary | ICD-10-CM | POA: Diagnosis not present

## 2020-12-15 DIAGNOSIS — N2581 Secondary hyperparathyroidism of renal origin: Secondary | ICD-10-CM | POA: Diagnosis not present

## 2020-12-15 DIAGNOSIS — E875 Hyperkalemia: Secondary | ICD-10-CM | POA: Diagnosis not present

## 2020-12-16 ENCOUNTER — Telehealth: Payer: Self-pay | Admitting: Neurology

## 2020-12-16 ENCOUNTER — Encounter: Payer: Self-pay | Admitting: Neurology

## 2020-12-16 ENCOUNTER — Other Ambulatory Visit: Payer: Self-pay | Admitting: Gastroenterology

## 2020-12-16 DIAGNOSIS — N2581 Secondary hyperparathyroidism of renal origin: Secondary | ICD-10-CM | POA: Diagnosis not present

## 2020-12-16 DIAGNOSIS — E875 Hyperkalemia: Secondary | ICD-10-CM | POA: Diagnosis not present

## 2020-12-16 DIAGNOSIS — N186 End stage renal disease: Secondary | ICD-10-CM | POA: Diagnosis not present

## 2020-12-16 DIAGNOSIS — R52 Pain, unspecified: Secondary | ICD-10-CM | POA: Diagnosis not present

## 2020-12-16 DIAGNOSIS — Z20822 Contact with and (suspected) exposure to covid-19: Secondary | ICD-10-CM | POA: Diagnosis not present

## 2020-12-16 DIAGNOSIS — Z992 Dependence on renal dialysis: Secondary | ICD-10-CM | POA: Diagnosis not present

## 2020-12-16 DIAGNOSIS — J029 Acute pharyngitis, unspecified: Secondary | ICD-10-CM | POA: Diagnosis not present

## 2020-12-16 MED ORDER — LEVETIRACETAM 250 MG PO TABS: 250.0000 mg | ORAL_TABLET | Freq: Every day | ORAL | 0 refills | Status: DC

## 2020-12-16 NOTE — Telephone Encounter (Signed)
Called patient and informed him that [place] has been sent to pharmacy and a f/u is needed. Patient verbalized understanding and had no questions or concerns.

## 2020-12-16 NOTE — Telephone Encounter (Signed)
I'll send a 43-month refill for keppra 250mg  1 tab bedtime.  He needs a follow-up.

## 2020-12-19 ENCOUNTER — Telehealth: Payer: Self-pay | Admitting: Internal Medicine

## 2020-12-19 DIAGNOSIS — N2581 Secondary hyperparathyroidism of renal origin: Secondary | ICD-10-CM | POA: Diagnosis not present

## 2020-12-19 DIAGNOSIS — R509 Fever, unspecified: Secondary | ICD-10-CM | POA: Diagnosis not present

## 2020-12-19 DIAGNOSIS — E875 Hyperkalemia: Secondary | ICD-10-CM | POA: Diagnosis not present

## 2020-12-19 DIAGNOSIS — U071 COVID-19: Secondary | ICD-10-CM | POA: Diagnosis not present

## 2020-12-19 DIAGNOSIS — Z992 Dependence on renal dialysis: Secondary | ICD-10-CM | POA: Diagnosis not present

## 2020-12-19 DIAGNOSIS — R5383 Other fatigue: Secondary | ICD-10-CM | POA: Diagnosis not present

## 2020-12-19 DIAGNOSIS — N186 End stage renal disease: Secondary | ICD-10-CM | POA: Diagnosis not present

## 2020-12-19 NOTE — Telephone Encounter (Signed)
Ok to have pt go to  [email or web address]  As he would probable qualify for paxlovid antibiotic

## 2020-12-19 NOTE — Telephone Encounter (Signed)
  Patient calling to report he tested positive for COVID today He has body pain, sore throat, fever, cough He is seeking advice and medication  [place] ([place]), [place] - [address]

## 2020-12-20 ENCOUNTER — Telehealth (INDEPENDENT_AMBULATORY_CARE_PROVIDER_SITE_OTHER): Payer: Federal, State, Local not specified - PPO | Admitting: Internal Medicine

## 2020-12-20 ENCOUNTER — Encounter: Payer: Self-pay | Admitting: Oncology

## 2020-12-20 ENCOUNTER — Telehealth: Payer: Self-pay | Admitting: Oncology

## 2020-12-20 DIAGNOSIS — E875 Hyperkalemia: Secondary | ICD-10-CM | POA: Diagnosis not present

## 2020-12-20 DIAGNOSIS — N185 Chronic kidney disease, stage 5: Secondary | ICD-10-CM | POA: Diagnosis not present

## 2020-12-20 DIAGNOSIS — E1022 Type 1 diabetes mellitus with diabetic chronic kidney disease: Secondary | ICD-10-CM | POA: Diagnosis not present

## 2020-12-20 DIAGNOSIS — U071 COVID-19: Secondary | ICD-10-CM | POA: Diagnosis not present

## 2020-12-20 DIAGNOSIS — Z992 Dependence on renal dialysis: Secondary | ICD-10-CM | POA: Diagnosis not present

## 2020-12-20 DIAGNOSIS — N186 End stage renal disease: Secondary | ICD-10-CM | POA: Diagnosis not present

## 2020-12-20 DIAGNOSIS — N2581 Secondary hyperparathyroidism of renal origin: Secondary | ICD-10-CM | POA: Diagnosis not present

## 2020-12-20 MED ORDER — ALBUTEROL SULFATE HFA 108 (90 BASE) MCG/ACT IN AERS: 2.0000 | INHALATION_SPRAY | Freq: Four times a day (QID) | RESPIRATORY_TRACT | 0 refills | Status: DC | PRN

## 2020-12-20 MED ORDER — ONDANSETRON HCL 4 MG PO TABS: 4.0000 mg | ORAL_TABLET | Freq: Three times a day (TID) | ORAL | 0 refills | Status: DC | PRN

## 2020-12-20 MED ORDER — HYDROCODONE BIT-HOMATROP MBR 5-1.5 MG/5ML PO SOLN: 5.0000 mL | Freq: Four times a day (QID) | ORAL | 0 refills | Status: AC | PRN

## 2020-12-20 MED ORDER — MOLNUPIRAVIR EUA 200MG CAPSULE: 4.0000 | ORAL_CAPSULE | Freq: Two times a day (BID) | ORAL | 0 refills | Status: AC

## 2020-12-20 NOTE — Telephone Encounter (Signed)
Outpatient Oral COVID Treatment Note  I connected with Hector Mahoney on 12/20/2020/4:30 PM by telephone and verified that I am speaking with the correct person using two identifiers.  I discussed the limitations, risks, security, and privacy concerns of performing an evaluation and management service by telephone and the availability of in person appointments. I also discussed with the patient that there may be a patient responsible charge related to this service. The patient expressed understanding and agreed to proceed.  Patient location: Clinic  Provider location: Clinic   Diagnosis: COVID-19 infection  Purpose of visit: Discussion of potential use of Molnupiravir or Paxlovid, a new treatment for mild to moderate COVID-19 viral infection in non-hospitalized patients.   Subjective: Patient is a 50 y.o. male who has been diagnosed with COVID 19 viral infection.  Their symptoms began on 12/16/20.   Past Medical History:  Diagnosis Date  . ABSCESS, TOOTH 04/23/2009  . AKI (acute kidney injury) ([place]) 08/2015  . Anemia   . CHF (congestive heart failure) ([place])   . Chronic kidney disease    dialysis M-W-F  . DIABETES MELLITUS, TYPE I 07/30/2007  . Esophageal reflux 04/27/2009  . FATIGUE 04/27/2009   Resolved per patient 06/03/19, no longer a problem  . HYPERLIPIDEMIA 04/24/2007  . HYPERTENSION 04/24/2007  . INSOMNIA-SLEEP DISORDER-UNSPEC 04/27/2009   resolved, no longer a problem per patient 06/03/19  . LUMBAR STRAIN, ACUTE 12/11/2008  . RASH-NONVESICULAR 03/11/2008    No Known Allergies   Current Outpatient Medications:  .  acetaminophen (TYLENOL) 500 MG tablet, Take by mouth., Disp: , Rfl:  .  albuterol (VENTOLIN HFA) 108 (90 Base) MCG/ACT inhaler, Inhale 2 puffs into the lungs every 6 (six) hours as needed for wheezing or shortness of breath., Disp: 8 g, Rfl: 0 .  amLODipine (NORVASC) 5 MG tablet, Take 1 tablet (5 mg total) by mouth at bedtime., Disp: 90 tablet, Rfl: 3 .  aspirin EC  81 MG EC tablet, Take 1 tablet (81 mg total) by mouth daily., Disp: 30 tablet, Rfl: 0 .  atorvastatin (LIPITOR) 80 MG tablet, TAKE ONE TABLET BY MOUTH AT BEDTIME FOR CHOLESTEROL, Disp: , Rfl:  .  calcitRIOL (ROCALTROL) 0.25 MCG capsule, Take 1 capsule (0.25 mcg total) by mouth every Monday, Wednesday, and Friday at 6 PM., Disp: 15 capsule, Rfl: 0 .  calcium acetate (PHOSLO) 667 MG capsule, Take 3 capsules (2,001 mg total) by mouth 3 (three) times daily with meals., Disp: 90 capsule, Rfl: 11 .  cinacalcet (SENSIPAR) 30 MG tablet, Take 60 mg by mouth every evening., Disp: , Rfl:  .  Continuous Blood Gluc Sensor (FREESTYLE LIBRE 14 DAY SENSOR) MISC, USE 1 SENSOR MISCELLANEOUS AS DIRECTED FOR GLUCOSE MONITORING, Disp: , Rfl:  .  fexofenadine (ALLEGRA) 180 MG tablet, Take 1 tablet (180 mg total) by mouth daily. (Patient not taking: Reported on 11/01/2020), Disp: 90 tablet, Rfl: 3 .  gabapentin (NEURONTIN) 300 MG capsule, 1-2 tab by mouth at bedtime as needed, Disp: 180 capsule, Rfl: 1 .  glycopyrrolate (ROBINUL) 2 MG tablet, Take 1 tablet by mouth twice daily, Disp: 60 tablet, Rfl: 0 .  heparin 1000 unit/mL SOLN injection, Heparin Sodium (Porcine) 1,000 Units/mL Systemic, Disp: , Rfl:  .  heparin 1000 unit/mL SOLN injection, Heparin Sodium (Porcine) 1,000 Units/mL Systemic, Disp: , Rfl:  .  HYDROcodone bit-homatropine (HYCODAN) 5-1.5 MG/5ML syrup, Take 5 mLs by mouth every 6 (six) hours as needed for up to 10 days for cough., Disp: 180 mL, Rfl: 0 .  insulin aspart (NOVOLOG) 100 UNIT/ML injection, INJECT 8 TO 12 UNITS INTO THE SKIN THREE TIMES DAILY WITH MEALS, Disp: 10 mL, Rfl: 0 .  insulin glargine (LANTUS) 100 UNIT/ML injection, Inject 0.25 mLs (25 Units total) into the skin at bedtime., Disp: 20 mL, Rfl: 5 .  levETIRAcetam (KEPPRA) 250 MG tablet, Take 1 tablet (250 mg total) by mouth at bedtime., Disp: 90 tablet, Rfl: 0 .  meclizine (ANTIVERT) 12.5 MG tablet, TAKE 1 TABLET BY MOUTH THREE TIMES DAILY AS  NEEDED FOR DIZZINESS, Disp: 40 tablet, Rfl: 0 .  multivitamin (RENA-VIT) TABS tablet, Take by mouth., Disp: , Rfl:  .  omeprazole (PRILOSEC) 40 MG capsule, Take 1 capsule (40 mg total) by mouth 2 (two) times daily., Disp: 60 capsule, Rfl: 11 .  ondansetron (ZOFRAN) 4 MG tablet, Take 1 tablet (4 mg total) by mouth every 8 (eight) hours as needed for nausea or vomiting., Disp: 30 tablet, Rfl: 0 .  polyethylene glycol powder (GLYCOLAX/MIRALAX) 17 GM/SCOOP powder, Take by mouth., Disp: , Rfl:  .  rOPINIRole (REQUIP) 1 MG tablet, Take by mouth., Disp: , Rfl:  .  rosuvastatin (CRESTOR) 10 MG tablet, Take 1 tablet (10 mg total) by mouth daily., Disp: 30 tablet, Rfl: 3 .  Tdap (ADACEL) 12-12-13.5 LF-MCG/0.5 injection, Inject into the muscle., Disp: , Rfl:  .  triamcinolone (NASACORT) 55 MCG/ACT AERO nasal inhaler, Place 2 sprays into the nose daily., Disp: 1 each, Rfl: 0 .  Tuberculin PPD (TUBERSOL ID), Inject into the skin. , Disp: , Rfl:   Objective: Patient sounds well.  They are in no apparent distress.  Breathing is non labored.  Mood and behavior are normal.  Laboratory Data:  No results found for this or any previous visit (from the past 2160 hour(s)).   Assessment: 50 y.o. male with mild/moderate COVID 19 viral infection diagnosed on 12/18/20 at high risk for progression to severe COVID 19.  Plan:  This patient is a 50 y.o. male that meets the following criteria for Emergency Use Authorization of: Molnupiravir  1. Age >18 yr 2. SARS-COV-2 positive test 3. Symptom onset < 5 days 4. Mild-to-moderate COVID disease with high risk for severe progression to hospitalization or death   I have spoken and communicated the following to the patient or parent/caregiver regarding: 1. Molnupiravir is an unapproved drug that is authorized for use under an [employer].  2. There are no adequate, approved, available products for the treatment of COVID-19 in adults who have mild-to-moderate  COVID-19 and are at high risk for progressing to severe COVID-19, including hospitalization or death. 3. Other therapeutics are currently authorized. For additional information on all products authorized for treatment or prevention of COVID-19, please see [employer].  4. There are benefits and risks of taking this treatment as outlined in the "Fact Sheet for Patients and Caregivers."  5. "Fact Sheet for Patients and Caregivers" was reviewed with patient. A hard copy will be provided to patient from pharmacy prior to the patient receiving treatment. 6. Patients should continue to self-isolate and use infection control measures (e.g., wear mask, isolate, social distance, avoid sharing personal items, clean and disinfect "high touch" surfaces, and frequent handwashing) according to CDC guidelines.  7. The patient or parent/caregiver has the option to accept or refuse treatment. 8. [place] has established a pregnancy surveillance program. 69. Females of childbearing potential should use a reliable method of contraception correctly and consistently, as applicable, for the duration of treatment and for 4  days after the last dose of Molnupiravir. 36. Males of reproductive potential who are sexually active with females of childbearing potential should use a reliable method of contraception correctly and consistently during treatment and for at least 3 months after the last dose. 11. Pregnancy status and risk was assessed. Patient verbalized understanding of precautions.   After reviewing above information with the patient, the patient agrees to receive molnupiravir.  Follow up instructions:    . Take prescription BID x 5 days as directed . Reach out to pharmacist for counseling on medication if desired . For concerns regarding further COVID symptoms please follow up with your PCP or  urgent care . For urgent or life-threatening issues, seek care at your local emergency department  The patient was provided an opportunity to ask questions, and all were answered. The patient agreed with the plan and demonstrated an understanding of the instructions.   [place]    The patient was advised to call their PCP or seek an in-person evaluation if the symptoms worsen or if the condition fails to improve as anticipated.   I provided 15 minutes of non face-to-face telephone visit time during this encounter, and > 50% was spent counseling as documented under my assessment & plan.  Jacquelin Hawking, NP 12/20/2020 /4:30 PM

## 2020-12-20 NOTE — Telephone Encounter (Signed)
Information listed below given to patient.

## 2020-12-20 NOTE — Progress Notes (Signed)
Patient ID: Hector Mahoney, male   DOB: 1970-10-21, 50 y.o.   MRN: [id number]

## 2020-12-22 ENCOUNTER — Inpatient Hospital Stay (HOSPITAL_COMMUNITY)
Admission: EM | Admit: 2020-12-22 | Discharge: 2020-12-25 | DRG: 177 | Disposition: A | Discharge status: Home / Self Care | Payer: No Typology Code available for payment source | Attending: Internal Medicine | Admitting: Internal Medicine

## 2020-12-22 ENCOUNTER — Other Ambulatory Visit: Payer: Self-pay

## 2020-12-22 ENCOUNTER — Encounter (HOSPITAL_COMMUNITY): Payer: Self-pay | Admitting: Internal Medicine

## 2020-12-22 ENCOUNTER — Emergency Department (HOSPITAL_COMMUNITY): Payer: No Typology Code available for payment source

## 2020-12-22 DIAGNOSIS — E1065 Type 1 diabetes mellitus with hyperglycemia: Secondary | ICD-10-CM | POA: Diagnosis not present

## 2020-12-22 DIAGNOSIS — K219 Gastro-esophageal reflux disease without esophagitis: Secondary | ICD-10-CM | POA: Diagnosis present

## 2020-12-22 DIAGNOSIS — J1282 Pneumonia due to coronavirus disease 2019: Secondary | ICD-10-CM | POA: Diagnosis not present

## 2020-12-22 DIAGNOSIS — G2581 Restless legs syndrome: Secondary | ICD-10-CM | POA: Diagnosis not present

## 2020-12-22 DIAGNOSIS — N186 End stage renal disease: Secondary | ICD-10-CM | POA: Diagnosis not present

## 2020-12-22 DIAGNOSIS — G253 Myoclonus: Secondary | ICD-10-CM | POA: Diagnosis present

## 2020-12-22 DIAGNOSIS — D631 Anemia in chronic kidney disease: Secondary | ICD-10-CM | POA: Diagnosis present

## 2020-12-22 DIAGNOSIS — Z7982 Long term (current) use of aspirin: Secondary | ICD-10-CM

## 2020-12-22 DIAGNOSIS — Z833 Family history of diabetes mellitus: Secondary | ICD-10-CM

## 2020-12-22 DIAGNOSIS — Z794 Long term (current) use of insulin: Secondary | ICD-10-CM

## 2020-12-22 DIAGNOSIS — I5032 Chronic diastolic (congestive) heart failure: Secondary | ICD-10-CM | POA: Diagnosis not present

## 2020-12-22 DIAGNOSIS — E1042 Type 1 diabetes mellitus with diabetic polyneuropathy: Secondary | ICD-10-CM | POA: Diagnosis present

## 2020-12-22 DIAGNOSIS — E109 Type 1 diabetes mellitus without complications: Secondary | ICD-10-CM | POA: Diagnosis present

## 2020-12-22 DIAGNOSIS — E8889 Other specified metabolic disorders: Secondary | ICD-10-CM | POA: Diagnosis present

## 2020-12-22 DIAGNOSIS — Z992 Dependence on renal dialysis: Secondary | ICD-10-CM

## 2020-12-22 DIAGNOSIS — I132 Hypertensive heart and chronic kidney disease with heart failure and with stage 5 chronic kidney disease, or end stage renal disease: Secondary | ICD-10-CM | POA: Diagnosis not present

## 2020-12-22 DIAGNOSIS — Z7682 Awaiting organ transplant status: Secondary | ICD-10-CM | POA: Diagnosis not present

## 2020-12-22 DIAGNOSIS — Z87891 Personal history of nicotine dependence: Secondary | ICD-10-CM

## 2020-12-22 DIAGNOSIS — E1022 Type 1 diabetes mellitus with diabetic chronic kidney disease: Secondary | ICD-10-CM | POA: Diagnosis present

## 2020-12-22 DIAGNOSIS — R0602 Shortness of breath: Secondary | ICD-10-CM | POA: Diagnosis not present

## 2020-12-22 DIAGNOSIS — E875 Hyperkalemia: Secondary | ICD-10-CM | POA: Diagnosis not present

## 2020-12-22 DIAGNOSIS — Z79899 Other long term (current) drug therapy: Secondary | ICD-10-CM

## 2020-12-22 DIAGNOSIS — U071 COVID-19: Principal | ICD-10-CM | POA: Diagnosis present

## 2020-12-22 DIAGNOSIS — E785 Hyperlipidemia, unspecified: Secondary | ICD-10-CM | POA: Diagnosis not present

## 2020-12-22 DIAGNOSIS — R0902 Hypoxemia: Secondary | ICD-10-CM | POA: Diagnosis not present

## 2020-12-22 DIAGNOSIS — I10 Essential (primary) hypertension: Secondary | ICD-10-CM | POA: Diagnosis not present

## 2020-12-22 DIAGNOSIS — E669 Obesity, unspecified: Secondary | ICD-10-CM | POA: Diagnosis not present

## 2020-12-22 DIAGNOSIS — R000 Tachycardia, unspecified: Secondary | ICD-10-CM | POA: Diagnosis not present

## 2020-12-22 DIAGNOSIS — T380X5A Adverse effect of glucocorticoids and synthetic analogues, initial encounter: Secondary | ICD-10-CM | POA: Diagnosis not present

## 2020-12-22 DIAGNOSIS — Z6834 Body mass index (BMI) 34.0-34.9, adult: Secondary | ICD-10-CM

## 2020-12-22 DIAGNOSIS — J9601 Acute respiratory failure with hypoxia: Secondary | ICD-10-CM | POA: Diagnosis present

## 2020-12-22 HISTORY — DX: Dependence on renal dialysis: Z99.2

## 2020-12-22 HISTORY — DX: End stage renal disease: N18.6

## 2020-12-22 LAB — I-STAT VENOUS BLOOD GAS, ED
Acid-Base Excess: 5 mmol/L — ABNORMAL HIGH (ref 0.0–2.0)
Bicarbonate: 31.1 mmol/L — ABNORMAL HIGH (ref 20.0–28.0)
Calcium, Ion: 0.96 mmol/L — ABNORMAL LOW (ref 1.15–1.40)
HCT: 33 % — ABNORMAL LOW (ref 39.0–52.0)
Hemoglobin: 11.2 g/dL — ABNORMAL LOW (ref 13.0–17.0)
O2 Saturation: 98 %
Potassium: 7 mmol/L (ref 3.5–5.1)
Sodium: 133 mmol/L — ABNORMAL LOW (ref 135–145)
TCO2: 33 mmol/L — ABNORMAL HIGH (ref 22–32)
pCO2, Ven: 53.5 mmHg (ref 44.0–60.0)
pH, Ven: 7.373 (ref 7.250–7.430)
pO2, Ven: 105 mmHg — ABNORMAL HIGH (ref 32.0–45.0)

## 2020-12-22 LAB — CBC WITH DIFFERENTIAL/PLATELET
Abs Immature Granulocytes: 0.09 10*3/uL — ABNORMAL HIGH (ref 0.00–0.07)
Basophils Absolute: 0 10*3/uL (ref 0.0–0.1)
Basophils Relative: 0 %
Eosinophils Absolute: 0.1 10*3/uL (ref 0.0–0.5)
Eosinophils Relative: 1 %
HCT: 32.3 % — ABNORMAL LOW (ref 39.0–52.0)
Hemoglobin: 10.4 g/dL — ABNORMAL LOW (ref 13.0–17.0)
Immature Granulocytes: 1 %
Lymphocytes Relative: 10 %
Lymphs Abs: 1.1 10*3/uL (ref 0.7–4.0)
MCH: 27.3 pg (ref 26.0–34.0)
MCHC: 32.2 g/dL (ref 30.0–36.0)
MCV: 84.8 fL (ref 80.0–100.0)
Monocytes Absolute: 0.8 10*3/uL (ref 0.1–1.0)
Monocytes Relative: 6 %
Neutro Abs: 9.8 10*3/uL — ABNORMAL HIGH (ref 1.7–7.7)
Neutrophils Relative %: 82 %
Platelets: 250 10*3/uL (ref 150–400)
RBC: 3.81 MIL/uL — ABNORMAL LOW (ref 4.22–5.81)
RDW: 14.6 % (ref 11.5–15.5)
WBC: 11.9 10*3/uL — ABNORMAL HIGH (ref 4.0–10.5)
nRBC: 0 % (ref 0.0–0.2)

## 2020-12-22 LAB — BASIC METABOLIC PANEL
Anion gap: 14 (ref 5–15)
BUN: 78 mg/dL — ABNORMAL HIGH (ref 6–20)
CO2: 27 mmol/L (ref 22–32)
Calcium: 8.3 mg/dL — ABNORMAL LOW (ref 8.9–10.3)
Chloride: 92 mmol/L — ABNORMAL LOW (ref 98–111)
Creatinine, Ser: 16.35 mg/dL — ABNORMAL HIGH (ref 0.61–1.24)
GFR, Estimated: 3 mL/min — ABNORMAL LOW (ref >60)
Glucose, Bld: 185 mg/dL — ABNORMAL HIGH (ref 70–99)
Potassium: 7.1 mmol/L (ref 3.5–5.1)
Sodium: 133 mmol/L — ABNORMAL LOW (ref 135–145)

## 2020-12-22 LAB — PROCALCITONIN: Procalcitonin: 2.53 ng/mL

## 2020-12-22 LAB — HEMOGLOBIN A1C
Hgb A1c MFr Bld: 7.3 % — ABNORMAL HIGH (ref 4.8–5.6)
Mean Plasma Glucose: 162.81 mg/dL

## 2020-12-22 LAB — LACTATE DEHYDROGENASE: LDH: 186 U/L (ref 98–192)

## 2020-12-22 LAB — POTASSIUM: Potassium: 4.1 mmol/L (ref 3.5–5.1)

## 2020-12-22 LAB — GLUCOSE, CAPILLARY
Glucose-Capillary: 233 mg/dL — ABNORMAL HIGH (ref 70–99)
Glucose-Capillary: 116 mg/dL — ABNORMAL HIGH (ref 70–99)
Glucose-Capillary: 130 mg/dL — ABNORMAL HIGH (ref 70–99)

## 2020-12-22 LAB — FIBRINOGEN: Fibrinogen: 759 mg/dL — ABNORMAL HIGH (ref 210–475)

## 2020-12-22 LAB — CBG MONITORING, ED: Glucose-Capillary: 171 mg/dL — ABNORMAL HIGH (ref 70–99)

## 2020-12-22 LAB — HIV ANTIBODY (ROUTINE TESTING W REFLEX): HIV Screen 4th Generation wRfx: NONREACTIVE

## 2020-12-22 LAB — FERRITIN: Ferritin: 1170 ng/mL — ABNORMAL HIGH (ref 24–336)

## 2020-12-22 LAB — MAGNESIUM: Magnesium: 2 mg/dL (ref 1.7–2.4)

## 2020-12-22 LAB — C-REACTIVE PROTEIN: CRP: 36.6 mg/dL — ABNORMAL HIGH (ref <1.0)

## 2020-12-22 LAB — D-DIMER, QUANTITATIVE: D-Dimer, Quant: 2.42 ug/mL-FEU — ABNORMAL HIGH (ref 0.00–0.50)

## 2020-12-22 MED ORDER — GABAPENTIN 300 MG PO CAPS
300.0000 mg | ORAL_CAPSULE | Freq: Every evening | ORAL | Status: DC | PRN
  Administered 2020-12-22: 300 mg via ORAL
  Filled 2020-12-22: qty 1

## 2020-12-22 MED ORDER — ONDANSETRON HCL 4 MG PO TABS: 4.0000 mg | ORAL_TABLET | Freq: Four times a day (QID) | ORAL | Status: DC | PRN

## 2020-12-22 MED ORDER — INSULIN ASPART 100 UNIT/ML IV SOLN
5.0000 [IU] | Freq: Once | INTRAVENOUS | Status: AC
  Administered 2020-12-22: 5 [IU] via INTRAVENOUS

## 2020-12-22 MED ORDER — CALCIUM CARBONATE ANTACID 1250 MG/5ML PO SUSP
500.0000 mg | Freq: Four times a day (QID) | ORAL | Status: DC | PRN
  Filled 2020-12-22: qty 5

## 2020-12-22 MED ORDER — ATORVASTATIN CALCIUM 80 MG PO TABS
80.0000 mg | ORAL_TABLET | Freq: Every day | ORAL | Status: DC
  Administered 2020-12-22 – 2020-12-25 (×4): 80 mg via ORAL
  Filled 2020-12-22 (×4): qty 1

## 2020-12-22 MED ORDER — ACETAMINOPHEN 650 MG RE SUPP: 650.0000 mg | Freq: Four times a day (QID) | RECTAL | Status: DC | PRN

## 2020-12-22 MED ORDER — ALTEPLASE 2 MG IJ SOLR: 2.0000 mg | Freq: Once | INTRAMUSCULAR | Status: DC | PRN

## 2020-12-22 MED ORDER — CINACALCET HCL 30 MG PO TABS
60.0000 mg | ORAL_TABLET | Freq: Every evening | ORAL | Status: DC
  Administered 2020-12-22 – 2020-12-24 (×3): 60 mg via ORAL
  Filled 2020-12-22 (×3): qty 2

## 2020-12-22 MED ORDER — AMLODIPINE BESYLATE 5 MG PO TABS
5.0000 mg | ORAL_TABLET | Freq: Every day | ORAL | Status: DC
  Administered 2020-12-22 – 2020-12-24 (×3): 5 mg via ORAL
  Filled 2020-12-22 (×3): qty 1

## 2020-12-22 MED ORDER — SODIUM CHLORIDE 0.9% FLUSH
3.0000 mL | Freq: Two times a day (BID) | INTRAVENOUS | Status: DC
  Administered 2020-12-22 – 2020-12-25 (×4): 3 mL via INTRAVENOUS

## 2020-12-22 MED ORDER — BISACODYL 5 MG PO TBEC: 5.0000 mg | DELAYED_RELEASE_TABLET | Freq: Every day | ORAL | Status: DC | PRN

## 2020-12-22 MED ORDER — LIDOCAINE-PRILOCAINE 2.5-2.5 % EX CREA: 1.0000 "application " | TOPICAL_CREAM | CUTANEOUS | Status: DC | PRN

## 2020-12-22 MED ORDER — ALBUTEROL SULFATE HFA 108 (90 BASE) MCG/ACT IN AERS
2.0000 | INHALATION_SPRAY | RESPIRATORY_TRACT | Status: DC | PRN
  Filled 2020-12-22: qty 6.7

## 2020-12-22 MED ORDER — CALCITRIOL 0.25 MCG PO CAPS
0.2500 ug | ORAL_CAPSULE | ORAL | Status: DC
  Administered 2020-12-23: 0.25 ug via ORAL
  Filled 2020-12-22: qty 1

## 2020-12-22 MED ORDER — ROPINIROLE HCL 1 MG PO TABS
1.0000 mg | ORAL_TABLET | Freq: Every day | ORAL | Status: DC
  Administered 2020-12-23 – 2020-12-24 (×2): 1 mg via ORAL
  Filled 2020-12-22 (×2): qty 1

## 2020-12-22 MED ORDER — HEPARIN SODIUM (PORCINE) 1000 UNIT/ML DIALYSIS
20.0000 [IU]/kg | INTRAMUSCULAR | Status: DC | PRN
  Administered 2020-12-22: 2000 [IU] via INTRAVENOUS_CENTRAL
  Filled 2020-12-22: qty 2

## 2020-12-22 MED ORDER — HYDROCOD POLST-CPM POLST ER 10-8 MG/5ML PO SUER: 5.0000 mL | Freq: Two times a day (BID) | ORAL | Status: DC | PRN

## 2020-12-22 MED ORDER — GUAIFENESIN-DM 100-10 MG/5ML PO SYRP: 10.0000 mL | ORAL_SOLUTION | ORAL | Status: DC | PRN

## 2020-12-22 MED ORDER — GLYCOPYRROLATE 1 MG PO TABS
2.0000 mg | ORAL_TABLET | Freq: Two times a day (BID) | ORAL | Status: DC
  Administered 2020-12-22 – 2020-12-25 (×6): 2 mg via ORAL
  Filled 2020-12-22 (×7): qty 2

## 2020-12-22 MED ORDER — LIDOCAINE HCL (PF) 1 % IJ SOLN: 5.0000 mL | INTRAMUSCULAR | Status: DC | PRN

## 2020-12-22 MED ORDER — CHLORHEXIDINE GLUCONATE CLOTH 2 % EX PADS
6.0000 | MEDICATED_PAD | Freq: Every day | CUTANEOUS | Status: DC
  Administered 2020-12-23: 6 via TOPICAL

## 2020-12-22 MED ORDER — SODIUM POLYSTYRENE SULFONATE 15 GM/60ML PO SUSP
30.0000 g | Freq: Once | ORAL | Status: AC
  Administered 2020-12-22: 30 g via ORAL
  Filled 2020-12-22: qty 120

## 2020-12-22 MED ORDER — ALBUTEROL SULFATE HFA 108 (90 BASE) MCG/ACT IN AERS: 2.0000 | INHALATION_SPRAY | Freq: Four times a day (QID) | RESPIRATORY_TRACT | Status: DC | PRN

## 2020-12-22 MED ORDER — ASPIRIN 81 MG PO CHEW
81.0000 mg | CHEWABLE_TABLET | Freq: Every day | ORAL | Status: DC
  Administered 2020-12-22 – 2020-12-25 (×4): 81 mg via ORAL
  Filled 2020-12-22 (×4): qty 1

## 2020-12-22 MED ORDER — ALBUTEROL SULFATE (2.5 MG/3ML) 0.083% IN NEBU
10.0000 mg | INHALATION_SOLUTION | Freq: Once | RESPIRATORY_TRACT | Status: AC
  Administered 2020-12-22: 10 mg via RESPIRATORY_TRACT
  Filled 2020-12-22: qty 12

## 2020-12-22 MED ORDER — DEXTROSE 50 % IV SOLN
1.0000 | Freq: Once | INTRAVENOUS | Status: AC
  Administered 2020-12-22: 50 mL via INTRAVENOUS
  Filled 2020-12-22: qty 50

## 2020-12-22 MED ORDER — PANTOPRAZOLE SODIUM 40 MG PO TBEC
40.0000 mg | DELAYED_RELEASE_TABLET | Freq: Every day | ORAL | Status: DC
  Administered 2020-12-22 – 2020-12-25 (×4): 40 mg via ORAL
  Filled 2020-12-22 (×4): qty 1

## 2020-12-22 MED ORDER — PREDNISONE 20 MG PO TABS: 50.0000 mg | ORAL_TABLET | Freq: Every day | ORAL | Status: DC

## 2020-12-22 MED ORDER — ZINC SULFATE 220 (50 ZN) MG PO CAPS
220.0000 mg | ORAL_CAPSULE | Freq: Every day | ORAL | Status: DC
  Administered 2020-12-22 – 2020-12-25 (×4): 220 mg via ORAL
  Filled 2020-12-22 (×4): qty 1

## 2020-12-22 MED ORDER — SODIUM CHLORIDE 0.9 % IV SOLN
200.0000 mg | Freq: Once | INTRAVENOUS | Status: AC
  Administered 2020-12-22: 200 mg via INTRAVENOUS
  Filled 2020-12-22: qty 40

## 2020-12-22 MED ORDER — HEPARIN SODIUM (PORCINE) 5000 UNIT/ML IJ SOLN
5000.0000 [IU] | Freq: Three times a day (TID) | INTRAMUSCULAR | Status: DC
  Administered 2020-12-22 – 2020-12-23 (×3): 5000 [IU] via SUBCUTANEOUS
  Filled 2020-12-22 (×5): qty 1

## 2020-12-22 MED ORDER — SODIUM CHLORIDE 0.9 % IV SOLN
100.0000 mg | Freq: Every day | INTRAVENOUS | Status: DC
  Administered 2020-12-23 – 2020-12-25 (×3): 100 mg via INTRAVENOUS
  Filled 2020-12-22 (×3): qty 20

## 2020-12-22 MED ORDER — CAMPHOR-MENTHOL 0.5-0.5 % EX LOTN
1.0000 "application " | TOPICAL_LOTION | Freq: Three times a day (TID) | CUTANEOUS | Status: DC | PRN
  Filled 2020-12-22: qty 222

## 2020-12-22 MED ORDER — NEPRO/CARBSTEADY PO LIQD
237.0000 mL | Freq: Three times a day (TID) | ORAL | Status: DC | PRN
  Filled 2020-12-22: qty 237

## 2020-12-22 MED ORDER — SODIUM CHLORIDE 0.9% FLUSH: 3.0000 mL | INTRAVENOUS | Status: DC | PRN

## 2020-12-22 MED ORDER — GABAPENTIN 300 MG PO CAPS
300.0000 mg | ORAL_CAPSULE | Freq: Once | ORAL | Status: AC
  Administered 2020-12-22: 300 mg via ORAL
  Filled 2020-12-22: qty 1

## 2020-12-22 MED ORDER — SODIUM CHLORIDE 0.9 % IV SOLN: 100.0000 mL | INTRAVENOUS | Status: DC | PRN

## 2020-12-22 MED ORDER — RENA-VITE PO TABS
1.0000 | ORAL_TABLET | Freq: Every day | ORAL | Status: DC
  Administered 2020-12-22 – 2020-12-25 (×4): 1 via ORAL
  Filled 2020-12-22 (×5): qty 1

## 2020-12-22 MED ORDER — ONDANSETRON HCL 4 MG/2ML IJ SOLN: 4.0000 mg | Freq: Four times a day (QID) | INTRAMUSCULAR | Status: DC | PRN

## 2020-12-22 MED ORDER — FLEET ENEMA 7-19 GM/118ML RE ENEM: 1.0000 | ENEMA | Freq: Once | RECTAL | Status: DC | PRN

## 2020-12-22 MED ORDER — LEVETIRACETAM 250 MG PO TABS
250.0000 mg | ORAL_TABLET | Freq: Every day | ORAL | Status: DC
  Administered 2020-12-22 – 2020-12-24 (×3): 250 mg via ORAL
  Filled 2020-12-22 (×3): qty 1

## 2020-12-22 MED ORDER — CHLORHEXIDINE GLUCONATE CLOTH 2 % EX PADS
6.0000 | MEDICATED_PAD | Freq: Every day | CUTANEOUS | Status: DC
  Administered 2020-12-22 – 2020-12-24 (×3): 6 via TOPICAL

## 2020-12-22 MED ORDER — DOCUSATE SODIUM 283 MG RE ENEM
1.0000 | ENEMA | RECTAL | Status: DC | PRN
  Filled 2020-12-22: qty 1

## 2020-12-22 MED ORDER — SODIUM CHLORIDE 0.9 % IV SOLN: 1.0000 g | Freq: Once | INTRAVENOUS | Status: DC

## 2020-12-22 MED ORDER — CALCIUM ACETATE (PHOS BINDER) 667 MG PO CAPS
2001.0000 mg | ORAL_CAPSULE | Freq: Three times a day (TID) | ORAL | Status: DC
  Administered 2020-12-22 – 2020-12-25 (×8): 2001 mg via ORAL
  Filled 2020-12-22 (×8): qty 3

## 2020-12-22 MED ORDER — LORATADINE 10 MG PO TABS
10.0000 mg | ORAL_TABLET | Freq: Every day | ORAL | Status: DC
  Administered 2020-12-23 – 2020-12-25 (×3): 10 mg via ORAL
  Filled 2020-12-22 (×3): qty 1

## 2020-12-22 MED ORDER — INSULIN ASPART 100 UNIT/ML IV SOLN: 10.0000 [IU] | Freq: Once | INTRAVENOUS | Status: DC

## 2020-12-22 MED ORDER — CALCIUM GLUCONATE-NACL 1-0.675 GM/50ML-% IV SOLN
1.0000 g | Freq: Once | INTRAVENOUS | Status: AC
  Administered 2020-12-22: 1000 mg via INTRAVENOUS
  Filled 2020-12-22: qty 50

## 2020-12-22 MED ORDER — INSULIN ASPART 100 UNIT/ML IJ SOLN
0.0000 [IU] | Freq: Three times a day (TID) | INTRAMUSCULAR | Status: DC
  Administered 2020-12-22: 2 [IU] via SUBCUTANEOUS
  Administered 2020-12-23 (×2): 4 [IU] via SUBCUTANEOUS
  Administered 2020-12-23: 1 [IU] via SUBCUTANEOUS
  Administered 2020-12-24: 6 [IU] via SUBCUTANEOUS
  Administered 2020-12-24: 4 [IU] via SUBCUTANEOUS
  Administered 2020-12-24: 5 [IU] via SUBCUTANEOUS

## 2020-12-22 MED ORDER — DOCUSATE SODIUM 100 MG PO CAPS
100.0000 mg | ORAL_CAPSULE | Freq: Two times a day (BID) | ORAL | Status: DC
  Administered 2020-12-22 – 2020-12-25 (×6): 100 mg via ORAL
  Filled 2020-12-22 (×6): qty 1

## 2020-12-22 MED ORDER — ACETAMINOPHEN 325 MG PO TABS: 650.0000 mg | ORAL_TABLET | Freq: Four times a day (QID) | ORAL | Status: DC | PRN

## 2020-12-22 MED ORDER — POLYETHYLENE GLYCOL 3350 17 G PO PACK: 17.0000 g | PACK | Freq: Every day | ORAL | Status: DC | PRN

## 2020-12-22 MED ORDER — SORBITOL 70 % SOLN
30.0000 mL | Status: DC | PRN
  Filled 2020-12-22: qty 30

## 2020-12-22 MED ORDER — METHYLPREDNISOLONE SODIUM SUCC 125 MG IJ SOLR
0.5000 mg/kg | Freq: Two times a day (BID) | INTRAMUSCULAR | Status: DC
  Administered 2020-12-22 – 2020-12-24 (×4): 48.75 mg via INTRAVENOUS
  Filled 2020-12-22 (×4): qty 2

## 2020-12-22 MED ORDER — PENTAFLUOROPROP-TETRAFLUOROETH EX AERO: 1.0000 "application " | INHALATION_SPRAY | CUTANEOUS | Status: DC | PRN

## 2020-12-22 MED ORDER — ASCORBIC ACID 500 MG PO TABS
500.0000 mg | ORAL_TABLET | Freq: Every day | ORAL | Status: DC
  Administered 2020-12-22 – 2020-12-25 (×4): 500 mg via ORAL
  Filled 2020-12-22 (×4): qty 1

## 2020-12-22 MED ORDER — SODIUM CHLORIDE 0.9 % IV SOLN: 250.0000 mL | INTRAVENOUS | Status: DC | PRN

## 2020-12-22 MED ORDER — HYDROXYZINE HCL 25 MG PO TABS: 25.0000 mg | ORAL_TABLET | Freq: Three times a day (TID) | ORAL | Status: DC | PRN

## 2020-12-22 MED ORDER — HEPARIN SODIUM (PORCINE) 1000 UNIT/ML DIALYSIS
1000.0000 [IU] | INTRAMUSCULAR | Status: DC | PRN
  Filled 2020-12-22: qty 1

## 2020-12-22 MED ORDER — INSULIN GLARGINE 100 UNIT/ML ~~LOC~~ SOLN
25.0000 [IU] | Freq: Every day | SUBCUTANEOUS | Status: DC
  Administered 2020-12-22 – 2020-12-23 (×2): 25 [IU] via SUBCUTANEOUS
  Filled 2020-12-22 (×3): qty 0.25

## 2020-12-22 MED ORDER — SODIUM CHLORIDE 0.9% FLUSH
3.0000 mL | Freq: Two times a day (BID) | INTRAVENOUS | Status: DC
  Administered 2020-12-22 – 2020-12-25 (×6): 3 mL via INTRAVENOUS

## 2020-12-22 MED ORDER — ZOLPIDEM TARTRATE 5 MG PO TABS: 5.0000 mg | ORAL_TABLET | Freq: Every evening | ORAL | Status: DC | PRN

## 2020-12-22 MED ORDER — OXYCODONE HCL 5 MG PO TABS
5.0000 mg | ORAL_TABLET | ORAL | Status: DC | PRN
  Administered 2020-12-22: 5 mg via ORAL
  Filled 2020-12-22: qty 1

## 2020-12-22 NOTE — ED Provider Notes (Signed)
[facility]ER[facility]ENC[facility] DEPARTMENT Provider Note   CSN: [id number] Arrival date & time: 12/22/20  0720     History Chief Complaint  Patient presents with  . Shortness of Breath    Hector Mahoney is a 50 y.o. male.  HPI  Presents with shortness of breath symptoms.  Is on day 8 of COVID-19 symptoms.  Was formally diagnosed 2 days ago.  Has been taking oral molnupiravir without improvement.  He has had intermittent low-grade temperatures with the most elevated being 100.3 a few days ago.  He has been short of breath and it was worse than it has been in the past last night and he had to try to sleep in a recliner.  He feels generalized weakness and feels his ability to walk is slightly affected.  He does Monday Tuesday Thursday Friday dialysis but missed Tuesday because he just was not feeling up to it.  He does not feel fluid overloaded.  He still makes urine.  He has had a wet cough.  Decreased appetite.  No vomiting, diarrhea, chest pain, abdominal pain, or passing out. Started on 2L O2 by EMS.      Past Medical History:  Diagnosis Date  . Anemia   . CHF (congestive heart failure) ([place])   . DIABETES MELLITUS, TYPE I 07/30/2007  . Esophageal reflux 04/27/2009  . ESRD (end stage renal disease) on dialysis ([place])    home HD 4x/week  . HYPERLIPIDEMIA 04/24/2007  . HYPERTENSION 04/24/2007    Patient Active Problem List   Diagnosis Date Noted  . COVID-19 12/22/2020  . RLS (restless legs syndrome) 08/11/2019  . Iron deficiency anemia 06/25/2019  . Allergic rhinitis 05/31/2019  . Acute dysfunction of right eustachian tube 05/31/2019  . Vertigo 05/31/2019  . ESRD on hemodialysis ([place]) 05/31/2019  . Abdominal pain 03/26/2018  . Nausea 03/26/2018  . Diarrhea 03/26/2018  . Acute renal failure superimposed on stage 5 chronic kidney disease, not on chronic dialysis ([place]) 10/20/2017  . Hyperkalemia 10/20/2017  . CKD stage 5 due to type 1 diabetes mellitus ([place])  09/27/2017  . Low back pain 03/29/2017  . Nocturnal leg cramps 04/03/2016  . Hypertensive urgency 09/02/2015  . Acute on chronic diastolic heart failure ([place]) 09/02/2015  . Patient nonadherence 09/02/2015  . Dyspnea 12/22/2012  . Insulin dependent diabetes mellitus with complications 38/45/3646  . Preventative health care 04/01/2011  . Esophageal reflux 04/27/2009  . INSOMNIA-SLEEP DISORDER-UNSPEC 04/27/2009  . Type 1 diabetes mellitus ([place]) 07/30/2007  . Dyslipidemia 04/24/2007  . Essential hypertension 04/24/2007    Past Surgical History:  Procedure Laterality Date  . AV FISTULA PLACEMENT Left 06/04/2019   Procedure: BRACHIAL-CEPHALIC ARTERIOVENOUS (AV) FISTULA CREATION LEFT ARM;  Surgeon: Rosetta Posner, MD;  Location: [facility] OR;  Service: Vascular;  Laterality: Left;  . COLONOSCOPY     polyp  . IR FLUORO GUIDE CV LINE RIGHT  10/22/2017  . IR US GUIDE VASC ACCESS RIGHT  10/22/2017  . MOUTH SURGERY     tooth ext       Family History  Problem Relation Age of Onset  . Hypertension Mother   . Diabetes Mother   . Diabetes Maternal Aunt   . Lung cancer Maternal Grandfather   . Colon polyps Maternal Uncle   . Colon cancer Maternal Uncle   . Heart disease Maternal Uncle   . Kidney disease Neg Hx   . Esophageal cancer Neg Hx   . Rectal cancer Neg Hx   .  Stomach cancer Neg Hx     Social History   Tobacco Use  . Smoking status: Former Smoker    Types: Cigarettes    Quit date: 05/03/2015    Years since quitting: 5.6  . Smokeless tobacco: Never Used  . Tobacco comment: 12/22/2012 "used to smoke cigarettes once or twice/month; haven't had any cigarettes for years"  Vaping Use  . Vaping Use: Never used  Substance Use Topics  . Alcohol use: Yes    Comment: occasional   . Drug use: No    Home Medications Prior to Admission medications   Medication Sig Start Date End Date Taking? Authorizing Provider  acetaminophen (TYLENOL) 500 MG tablet Take 500 mg by mouth every 6 (six)  hours as needed for mild pain. 05/06/20 05/10/21 Yes [provider]  albuterol (VENTOLIN HFA) 108 (90 Base) MCG/ACT inhaler Inhale 2 puffs into the lungs every 6 (six) hours as needed for wheezing or shortness of breath. 12/20/20  Yes Biagio Borg, MD  amLODipine (NORVASC) 5 MG tablet Take 1 tablet (5 mg total) by mouth at bedtime. 11/01/20  Yes Biagio Borg, MD  aspirin EC 81 MG EC tablet Take 1 tablet (81 mg total) by mouth daily. 09/06/15  Yes Nita Sells, MD  atorvastatin (LIPITOR) 80 MG tablet Take 80 mg by mouth daily. 02/26/18  Yes [provider]  calcitRIOL (ROCALTROL) 0.25 MCG capsule Take 1 capsule (0.25 mcg total) by mouth every Monday, Wednesday, and Friday at 6 PM. 09/30/17  Yes Thurnell Lose, MD  calcium acetate (PHOSLO) 667 MG capsule Take 3 capsules (2,001 mg total) by mouth 3 (three) times daily with meals. 11/01/20  Yes Biagio Borg, MD  cinacalcet (SENSIPAR) 30 MG tablet Take 60 mg by mouth every evening.   Yes [provider]  Continuous Blood Gluc Sensor (FREESTYLE LIBRE 14 DAY SENSOR) MISC 1 each by Other route daily. 07/22/20  Yes [provider]  fexofenadine (ALLEGRA) 180 MG tablet Take 1 tablet (180 mg total) by mouth daily. 03/17/20 03/17/21 Yes Biagio Borg, MD  gabapentin (NEURONTIN) 300 MG capsule 1-2 tab by mouth at bedtime as needed Patient taking differently: Take 300-600 mg by mouth at bedtime as needed (pain). 03/17/20  Yes Biagio Borg, MD  glycopyrrolate (ROBINUL) 2 MG tablet Take 1 tablet by mouth twice daily Patient taking differently: Take 2 mg by mouth 2 (two) times daily. 12/16/20  Yes Ladene Artist, MD  heparin 1000 unit/mL SOLN injection 1,000 Units by Dialysis route one time in dialysis. On Dialysis days (4 times per week) 05/27/20 05/26/21 Yes [provider]  HYDROcodone bit-homatropine (HYCODAN) 5-1.5 MG/5ML syrup Take 5 mLs by mouth every 6 (six) hours as needed for up to 10 days for cough. 12/20/20  12/30/20 Yes Biagio Borg, MD  insulin aspart (NOVOLOG) 100 UNIT/ML injection INJECT 8 TO 12 UNITS INTO THE SKIN THREE TIMES DAILY WITH MEALS Patient taking differently: Inject 8-12 Units into the skin 3 (three) times daily with meals. 10/10/20  Yes Biagio Borg, MD  insulin glargine (LANTUS) 100 UNIT/ML injection Inject 0.25 mLs (25 Units total) into the skin at bedtime. 10/25/17  Yes Rai, Ripudeep K, MD  levETIRAcetam (KEPPRA) 250 MG tablet Take 1 tablet (250 mg total) by mouth at bedtime. 12/16/20  Yes Patel, Donika K, DO  meclizine (ANTIVERT) 12.5 MG tablet TAKE 1 TABLET BY MOUTH THREE TIMES DAILY AS NEEDED FOR DIZZINESS Patient taking differently: Take 12.5 mg by mouth 3 (  three) times daily as needed for dizziness. 05/28/20  Yes Biagio Borg, MD  molnupiravir EUA 200 mg CAPS Take 4 capsules (800 mg total) by mouth 2 (two) times daily for 5 days. Patient taking differently: Take 4 capsules by mouth every 6 (six) hours. 12/20/20 12/25/20 Yes Burns, Wandra Feinstein, NP  multivitamin (RENA-VIT) TABS tablet Take 1 tablet by mouth daily. 03/20/19  Yes [provider]  omeprazole (PRILOSEC) 40 MG capsule Take 1 capsule (40 mg total) by mouth 2 (two) times daily. 11/26/19  Yes Ladene Artist, MD  polyethylene glycol powder (GLYCOLAX/MIRALAX) 17 GM/SCOOP powder Take 1 Container by mouth daily as needed for mild constipation. 10/25/17  Yes [provider]  rOPINIRole (REQUIP) 1 MG tablet Take 1 mg by mouth at bedtime. 08/12/19  Yes [provider]  rosuvastatin (CRESTOR) 10 MG tablet Take 1 tablet (10 mg total) by mouth daily. 10/25/17  Yes Rai, Ripudeep K, MD  triamcinolone (NASACORT) 55 MCG/ACT AERO nasal inhaler Place 2 sprays into the nose daily. Patient taking differently: Place 2 sprays into the nose daily as needed (allergies). 05/21/20  Yes McGowen, Adrian Blackwater, MD  ondansetron (ZOFRAN) 4 MG tablet Take 1 tablet (4 mg total) by mouth every 8 (eight) hours as needed for nausea or  vomiting. 12/20/20   Biagio Borg, MD    Allergies    Patient has no known allergies.  Review of Systems   Review of Systems  Constitutional: Positive for chills and fever.  HENT: Negative for ear pain and sore throat.   Eyes: Negative for pain and visual disturbance.  Respiratory: Positive for cough and shortness of breath.   Cardiovascular: Negative for chest pain and palpitations.  Gastrointestinal: Negative for abdominal pain and vomiting.  Genitourinary: Negative for dysuria and hematuria.  Musculoskeletal: Negative for arthralgias and back pain.  Skin: Negative for color change and rash.  Neurological: Negative for seizures and syncope.  All other systems reviewed and are negative.   Physical Exam Updated Vital Signs BP (!) 131/97   Pulse (!) 121   Temp 98 F (36.7 C) (Oral)   Resp 20   Wt 89.7 kg   SpO2 95%   BMI 31.92 kg/m   Physical Exam Vitals and nursing note reviewed.  Constitutional:      Appearance: He is well-developed.  HENT:     Head: Normocephalic and atraumatic.  Eyes:     Conjunctiva/sclera: Conjunctivae normal.  Cardiovascular:     Rate and Rhythm: Normal rate and regular rhythm.     Heart sounds: No murmur heard.   Pulmonary:     Effort: Pulmonary effort is normal. No respiratory distress.     Breath sounds: Normal breath sounds.  Abdominal:     Palpations: Abdomen is soft.     Tenderness: There is no abdominal tenderness.  Musculoskeletal:     Cervical back: Neck supple.     Left lower leg: No edema.  Skin:    General: Skin is warm and dry.  Neurological:     Mental Status: He is alert.     ED Results / Procedures / Treatments   Labs (all labs ordered are listed, but only abnormal results are displayed) Labs Reviewed  SARS CORONAVIRUS 2 (TAT 6-24 HRS) - Abnormal; Notable for the following components:      Result Value   SARS Coronavirus 2 POSITIVE (*)    All other components within normal limits  BLOOD CULTURE ID PANEL  (REFLEXED) - BCID2 -  Abnormal; Notable for the following components:   Staphylococcus species DETECTED (*)    Staphylococcus epidermidis DETECTED (*)    Methicillin resistance mecA/C DETECTED (*)    All other components within normal limits  CBC WITH DIFFERENTIAL/PLATELET - Abnormal; Notable for the following components:   WBC 11.9 (*)    RBC 3.81 (*)    Hemoglobin 10.4 (*)    HCT 32.3 (*)    Neutro Abs 9.8 (*)    Abs Immature Granulocytes 0.09 (*)    All other components within normal limits  BASIC METABOLIC PANEL - Abnormal; Notable for the following components:   Sodium 133 (*)    Potassium 7.1 (*)    Chloride 92 (*)    Glucose, Bld 185 (*)    BUN 78 (*)    Creatinine, Ser 16.35 (*)    Calcium 8.3 (*)    GFR, Estimated 3 (*)    All other components within normal limits  C-REACTIVE PROTEIN - Abnormal; Notable for the following components:   CRP 36.6 (*)    All other components within normal limits  D-DIMER, QUANTITATIVE - Abnormal; Notable for the following components:   D-Dimer, Quant 2.42 (*)    All other components within normal limits  FERRITIN - Abnormal; Notable for the following components:   Ferritin 1,170 (*)    All other components within normal limits  FIBRINOGEN - Abnormal; Notable for the following components:   Fibrinogen 759 (*)    All other components within normal limits  HEMOGLOBIN A1C - Abnormal; Notable for the following components:   Hgb A1c MFr Bld 7.3 (*)    All other components within normal limits  GLUCOSE, CAPILLARY - Abnormal; Notable for the following components:   Glucose-Capillary 233 (*)    All other components within normal limits  CBC WITH DIFFERENTIAL/PLATELET - Abnormal; Notable for the following components:   RBC 3.64 (*)    Hemoglobin 9.8 (*)    HCT 30.4 (*)    Neutro Abs 8.4 (*)    Abs Immature Granulocytes 0.09 (*)    All other components within normal limits  COMPREHENSIVE METABOLIC PANEL - Abnormal; Notable for the following  components:   Sodium 133 (*)    Potassium 5.8 (*)    Chloride 92 (*)    Glucose, Bld 239 (*)    BUN 44 (*)    Creatinine, Ser 10.91 (*)    Calcium 8.5 (*)    Total Protein 8.3 (*)    Albumin 3.1 (*)    GFR, Estimated 5 (*)    All other components within normal limits  C-REACTIVE PROTEIN - Abnormal; Notable for the following components:   CRP 38.3 (*)    All other components within normal limits  D-DIMER, QUANTITATIVE - Abnormal; Notable for the following components:   D-Dimer, Quant 2.03 (*)    All other components within normal limits  FERRITIN - Abnormal; Notable for the following components:   Ferritin 1,510 (*)    All other components within normal limits  PHOSPHORUS - Abnormal; Notable for the following components:   Phosphorus 5.0 (*)    All other components within normal limits  GLUCOSE, CAPILLARY - Abnormal; Notable for the following components:   Glucose-Capillary 116 (*)    All other components within normal limits  GLUCOSE, CAPILLARY - Abnormal; Notable for the following components:   Glucose-Capillary 130 (*)    All other components within normal limits  GLUCOSE, CAPILLARY - Abnormal; Notable for the following components:  Glucose-Capillary 313 (*)    All other components within normal limits  GLUCOSE, CAPILLARY - Abnormal; Notable for the following components:   Glucose-Capillary 324 (*)    All other components within normal limits  GLUCOSE, CAPILLARY - Abnormal; Notable for the following components:   Glucose-Capillary 160 (*)    All other components within normal limits  CBG MONITORING, ED - Abnormal; Notable for the following components:   Glucose-Capillary 171 (*)    All other components within normal limits  I-STAT VENOUS BLOOD GAS, ED - Abnormal; Notable for the following components:   pO2, Ven 105.0 (*)    Bicarbonate 31.1 (*)    TCO2 33 (*)    Acid-Base Excess 5.0 (*)    Sodium 133 (*)    Potassium 7.0 (*)    Calcium, Ion 0.96 (*)    HCT 33.0  (*)    Hemoglobin 11.2 (*)    All other components within normal limits  CULTURE, BLOOD (ROUTINE X 2)  CULTURE, BLOOD (ROUTINE X 2)  CULTURE, BLOOD (ROUTINE X 2)  CULTURE, BLOOD (ROUTINE X 2)  MAGNESIUM  HIV ANTIBODY (ROUTINE TESTING W REFLEX)  LACTATE DEHYDROGENASE  PROCALCITONIN  POTASSIUM  MAGNESIUM  CBC WITH DIFFERENTIAL/PLATELET  COMPREHENSIVE METABOLIC PANEL  C-REACTIVE PROTEIN  D-DIMER, QUANTITATIVE  FERRITIN  MAGNESIUM  PHOSPHORUS    EKG EKG Interpretation  Date/Time:  Thursday Dec 22 2020 07:29:12 EDT Ventricular Rate:  123 PR Interval:  156 QRS Duration: 93 QT Interval:  315 QTC Calculation: 451 R Axis:   12 Text Interpretation: Sinus tachycardia Anteroseptal infarct, age indeterminate Lateral leads are also involved flipped t wavesI and aVL No old tracing to compare Confirmed by Deno Etienne [phone]) on 12/22/2020 7:32:20 AM   Radiology DG Chest 1 View  Result Date: 12/22/2020 CLINICAL DATA:  Shortness of breath EXAM: CHEST  1 VIEW COMPARISON:  10/20/2017 FINDINGS: Heart size within normal limits. No pulmonary vascular congestion. There is patchy bilateral lung opacities. IMPRESSION: Patchy bilateral lung opacities may be due to multifocal pneumonia or pulmonary edema/fluid volume overload. Electronically Signed   By: Miachel Roux M.D.   On: 12/22/2020 08:45    Procedures Procedures   Medications Ordered in ED Medications  Chlorhexidine Gluconate Cloth 2 % PADS 6 each (6 each Topical Given 12/23/20 0534)  pentafluoroprop-tetrafluoroeth (GEBAUERS) aerosol 1 application (has no administration in time range)  lidocaine (PF) (XYLOCAINE) 1 % injection 5 mL (has no administration in time range)  lidocaine-prilocaine (EMLA) cream 1 application (has no administration in time range)  0.9 %  sodium chloride infusion (has no administration in time range)  0.9 %  sodium chloride infusion (has no administration in time range)  heparin injection 1,000 Units (has no  administration in time range)  alteplase (CATHFLO ACTIVASE) injection 2 mg (has no administration in time range)  heparin injection 2,000 Units (2,000 Units Dialysis Given 12/22/20 1340)  aspirin chewable tablet 81 mg (81 mg Oral Given 12/23/20 0921)  amLODipine (NORVASC) tablet 5 mg (5 mg Oral Given 12/22/20 2213)  atorvastatin (LIPITOR) tablet 80 mg (80 mg Oral Given 12/23/20 0921)  calcitRIOL (ROCALTROL) capsule 0.25 mcg (has no administration in time range)  cinacalcet (SENSIPAR) tablet 60 mg (60 mg Oral Given 12/22/20 1944)  insulin glargine (LANTUS) injection 25 Units (25 Units Subcutaneous Given 12/22/20 2314)  calcium acetate (PHOSLO) capsule 2,001 mg (2,001 mg Oral Given 12/23/20 1310)  glycopyrrolate (ROBINUL) tablet 2 mg (2 mg Oral Given 12/23/20 0924)  pantoprazole (PROTONIX) EC tablet 40 mg (  40 mg Oral Given 12/23/20 0921)  polyethylene glycol (MIRALAX / GLYCOLAX) packet 17 g (has no administration in time range)  gabapentin (NEURONTIN) capsule 300 mg (300 mg Oral Given 12/22/20 2224)  levETIRAcetam (KEPPRA) tablet 250 mg (250 mg Oral Given 12/22/20 2214)  rOPINIRole (REQUIP) tablet 1 mg (1 mg Oral Not Given 12/22/20 2215)  multivitamin (RENA-VIT) tablet 1 tablet (1 tablet Oral Given 12/23/20 0923)  loratadine (CLARITIN) tablet 10 mg (10 mg Oral Given 12/23/20 0921)  acetaminophen (TYLENOL) tablet 650 mg (has no administration in time range)    Or  acetaminophen (TYLENOL) suppository 650 mg (has no administration in time range)  zolpidem (AMBIEN) tablet 5 mg (has no administration in time range)  sorbitol 70 % solution 30 mL (has no administration in time range)  docusate sodium (ENEMEEZ) enema 283 mg (has no administration in time range)  ondansetron (ZOFRAN) tablet 4 mg (has no administration in time range)    Or  ondansetron (ZOFRAN) injection 4 mg (has no administration in time range)  camphor-menthol (SARNA) lotion 1 application (has no administration in time range)    And   hydrOXYzine (ATARAX/VISTARIL) tablet 25 mg (has no administration in time range)  calcium carbonate (dosed in mg elemental calcium) suspension 500 mg of elemental calcium (has no administration in time range)  feeding supplement (NEPRO CARB STEADY) liquid 237 mL (has no administration in time range)  sodium chloride flush (NS) 0.9 % injection 3 mL (3 mLs Intravenous Given 12/23/20 0933)  sodium chloride flush (NS) 0.9 % injection 3 mL (3 mLs Intravenous Given 12/23/20 0933)  sodium chloride flush (NS) 0.9 % injection 3 mL (has no administration in time range)  0.9 %  sodium chloride infusion (has no administration in time range)  albuterol (VENTOLIN HFA) 108 (90 Base) MCG/ACT inhaler 2 puff (has no administration in time range)  methylPREDNISolone sodium succinate (SOLU-MEDROL) 125 mg/2 mL injection 48.75 mg (48.75 mg Intravenous Given 12/23/20 0925)    Followed by  predniSONE (DELTASONE) tablet 50 mg (has no administration in time range)  guaiFENesin-dextromethorphan (ROBITUSSIN DM) 100-10 MG/5ML syrup 10 mL (has no administration in time range)  chlorpheniramine-HYDROcodone (TUSSIONEX) 10-8 MG/5ML suspension 5 mL (has no administration in time range)  ascorbic acid (VITAMIN C) tablet 500 mg (500 mg Oral Given 12/23/20 0921)  zinc sulfate capsule 220 mg (220 mg Oral Given 12/23/20 0921)  oxyCODONE (Oxy IR/ROXICODONE) immediate release tablet 5 mg (5 mg Oral Given 12/22/20 2058)  docusate sodium (COLACE) capsule 100 mg (100 mg Oral Given 12/23/20 0922)  bisacodyl (DULCOLAX) EC tablet 5 mg (has no administration in time range)  insulin aspart (novoLOG) injection 0-6 Units (4 Units Subcutaneous Given 12/23/20 1310)  heparin injection 5,000 Units (5,000 Units Subcutaneous Given 12/23/20 1311)  remdesivir 200 mg in sodium chloride 0.9% 250 mL IVPB (200 mg Intravenous New Bag/Given 12/22/20 2057)    Followed by  remdesivir 100 mg in sodium chloride 0.9 % 100 mL IVPB (100 mg Intravenous New Bag/Given 12/23/20  0933)  Chlorhexidine Gluconate Cloth 2 % PADS 6 each (6 each Topical Given 12/23/20 0534)  sodium zirconium cyclosilicate (LOKELMA) packet 10 g (10 g Oral Given 12/23/20 1311)  vancomycin (VANCOREADY) IVPB 2000 mg/400 mL (has no administration in time range)  vancomycin variable dose per unstable renal function (pharmacist dosing) (has no administration in time range)  albuterol (PROVENTIL) (2.5 MG/3ML) 0.083% nebulizer solution 10 mg (10 mg Nebulization Given 12/22/20 0951)  dextrose 50 % solution 50 mL (50 mLs Intravenous Given  12/22/20 0951)  calcium gluconate 1 g/ 50 mL sodium chloride IVPB (0 mg Intravenous Stopped 12/22/20 1105)  insulin aspart (novoLOG) injection 5 Units (5 Units Intravenous Given 12/22/20 1000)  sodium polystyrene (KAYEXALATE) 15 GM/60ML suspension 30 g (30 g Oral Given 12/22/20 1025)  gabapentin (NEURONTIN) capsule 300 mg (300 mg Oral Given 12/22/20 2214)    ED Course  I have reviewed the triage vital signs and the nursing notes.  Pertinent labs & imaging results that were available during my care of the patient were reviewed by me and considered in my medical decision making (see chart for details).    MDM Rules/Calculators/A&P                          Patient presents with shortness of breath.  Symptoms are overall consistent with COVID-19 infection.  I turned off his oxygen at bedside and he is slowly down trended to 88%.  He is tachypneic and tachycardic.  Afebrile.  Will evaluate white blood cell count.  Blood cultures drawn in consideration of bacterial infection although work-up is pending and the overall incidence of bacteremia and COVID-19 is low so will defer antibiotics or fluid resuscitation at this time.  No chest pain to suggest pulmonary embolus.  Patient is not grossly fluid overloaded; will obtain chest x-ray to evaluate for interstitial edema.  EKG obtained and shows T wave inversions in aVL and V2, sinus tachycardia, QTC about 400 which appears to be more  than half the RR interval.  T wave inversions were present on previous comparison EKG.  Chest x-ray shows interstitial edema, multifocal consolidations, no mediastinal widening; consistent with COVID-19 infection versus fluid overload.  Potassium results at 7.1.  Temporizing measures including calcium gluconate, insulin and dextrose, albuterol ordered.  Nephrology emergently contacted for dialysis and agree with the plan.  Communicated patient's history and physical. Pt admitted to hospitalist.   Final Clinical Impression(s) / ED Diagnoses Final diagnoses:  None    Rx / DC Orders ED Discharge Orders    None       Aris Lot, MD 12/23/20 [place], [place], DO 12/23/20 2302

## 2020-12-22 NOTE — Progress Notes (Signed)
Pt admitted to 5w14 from ED. A&O x4, skin intact. VS stable, Tele monitor placed and verified. 2 IV, R AC, SL. L arm fistula; clean, dry, intact. All questions/concerns addressed. Call bell within reach. Will continue to monitor.    12/22/20 1214  Vitals  Temp 99 F (37.2 C)  Temp Source Oral  BP 130/79  MAP (mmHg) 96  BP Location Right Arm  BP Method Automatic  Patient Position (if appropriate) Sitting  Pulse Rate (!) 114  ECG Heart Rate (!) 114  Resp 18  Level of Consciousness  Level of Consciousness Alert  MEWS COLOR  MEWS Score Color Yellow  Oxygen Therapy  SpO2 93 %  Pain Assessment  Pain Score 0  MEWS Score  MEWS Temp 0  MEWS Systolic 0  MEWS Pulse 2  MEWS RR 0  MEWS LOC 0  MEWS Score 2

## 2020-12-22 NOTE — ED Triage Notes (Addendum)
Pt from home BIB EMS for [facility] since last night; denies CP. Pt dx with COVID 8 days ago and was started on ABX. Pt 90% oin RA; 2L [place] applied. Productive cough with brown sputum present. Pt does HD at home 4x/wk. Pt dizzy upon standing from EMS stretcher. BGL 230.

## 2020-12-22 NOTE — ED Provider Notes (Incomplete)
[facility] EMERGENCY DEPARTMENT Provider Note   CSN: [id number] Arrival date & time: 12/22/20  0720     History Chief Complaint  Patient presents with  . Shortness of Breath    Hector Mahoney is a 50 y.o. male.  HPI     Past Medical History:  Diagnosis Date  . ABSCESS, TOOTH 04/23/2009  . AKI (acute kidney injury) ([place]) 08/2015  . Anemia   . CHF (congestive heart failure) ([place])   . Chronic kidney disease    dialysis M-W-F  . DIABETES MELLITUS, TYPE I 07/30/2007  . Esophageal reflux 04/27/2009  . FATIGUE 04/27/2009   Resolved per patient 06/03/19, no longer a problem  . HYPERLIPIDEMIA 04/24/2007  . HYPERTENSION 04/24/2007  . INSOMNIA-SLEEP DISORDER-UNSPEC 04/27/2009   resolved, no longer a problem per patient 06/03/19  . LUMBAR STRAIN, ACUTE 12/11/2008  . RASH-NONVESICULAR 03/11/2008    Patient Active Problem List   Diagnosis Date Noted  . RLS (restless legs syndrome) 08/11/2019  . Iron deficiency anemia 06/25/2019  . Allergic rhinitis 05/31/2019  . Acute dysfunction of right eustachian tube 05/31/2019  . Vertigo 05/31/2019  . ESRD on hemodialysis ([place]) 05/31/2019  . Abdominal pain 03/26/2018  . Nausea 03/26/2018  . Diarrhea 03/26/2018  . Acute renal failure superimposed on stage 5 chronic kidney disease, not on chronic dialysis ([place]) 10/20/2017  . Hyperkalemia 10/20/2017  . CKD stage 5 due to type 1 diabetes mellitus ([place]) 09/27/2017  . Low back pain 03/29/2017  . Nocturnal leg cramps 04/03/2016  . Hypertensive urgency 09/02/2015  . Acute on chronic diastolic heart failure ([place]) 09/02/2015  . Patient nonadherence 09/02/2015  . Dyspnea 12/22/2012  . Insulin dependent diabetes mellitus with complications 41/28/7867  . Preventative health care 04/01/2011  . Esophageal reflux 04/27/2009  . INSOMNIA-SLEEP DISORDER-UNSPEC 04/27/2009  . Type 1 diabetes mellitus ([place]) 07/30/2007  . Dyslipidemia 04/24/2007  . Essential hypertension 04/24/2007     Past Surgical History:  Procedure Laterality Date  . AV FISTULA PLACEMENT Left 06/04/2019   Procedure: BRACHIAL-CEPHALIC ARTERIOVENOUS (AV) FISTULA CREATION LEFT ARM;  Surgeon: Rosetta Posner, MD;  Location: [facility] OR;  Service: Vascular;  Laterality: Left;  . COLONOSCOPY     polyp  . IR FLUORO GUIDE CV LINE RIGHT  10/22/2017  . IR US GUIDE VASC ACCESS RIGHT  10/22/2017  . MOUTH SURGERY     tooth ext       Family History  Problem Relation Age of Onset  . Hypertension Mother   . Diabetes Mother   . Diabetes Maternal Aunt   . Lung cancer Maternal Grandfather   . Colon polyps Maternal Uncle   . Colon cancer Maternal Uncle   . Heart disease Maternal Uncle   . Kidney disease Neg Hx   . Esophageal cancer Neg Hx   . Rectal cancer Neg Hx   . Stomach cancer Neg Hx     Social History   Tobacco Use  . Smoking status: Former Smoker    Types: Cigarettes    Quit date: 05/03/2015    Years since quitting: 5.6  . Smokeless tobacco: Never Used  . Tobacco comment: 12/22/2012 "used to smoke cigarettes once or twice/month; haven't had any cigarettes for years"  Vaping Use  . Vaping Use: Never used  Substance Use Topics  . Alcohol use: Yes    Comment: occasional   . Drug use: No    Home Medications Prior to Admission medications   Medication Sig Start Date End Date Taking?  Authorizing Provider  acetaminophen (TYLENOL) 500 MG tablet Take by mouth. 05/06/20 05/10/21  [provider]  albuterol (VENTOLIN HFA) 108 (90 Base) MCG/ACT inhaler Inhale 2 puffs into the lungs every 6 (six) hours as needed for wheezing or shortness of breath. 12/20/20   Biagio Borg, MD  amLODipine (NORVASC) 5 MG tablet Take 1 tablet (5 mg total) by mouth at bedtime. 11/01/20   Biagio Borg, MD  aspirin EC 81 MG EC tablet Take 1 tablet (81 mg total) by mouth daily. 09/06/15   Nita Sells, MD  atorvastatin (LIPITOR) 80 MG tablet TAKE ONE TABLET BY MOUTH AT BEDTIME FOR CHOLESTEROL 02/26/18   [provider]  calcitRIOL (ROCALTROL) 0.25 MCG capsule Take 1 capsule (0.25 mcg total) by mouth every Monday, Wednesday, and Friday at 6 PM. 09/30/17   Thurnell Lose, MD  calcium acetate (PHOSLO) 667 MG capsule Take 3 capsules (2,001 mg total) by mouth 3 (three) times daily with meals. 11/01/20   Biagio Borg, MD  cinacalcet (SENSIPAR) 30 MG tablet Take 60 mg by mouth every evening.    [provider]  Continuous Blood Gluc Sensor (FREESTYLE LIBRE 14 DAY SENSOR) MISC USE 1 SENSOR MISCELLANEOUS AS DIRECTED FOR GLUCOSE MONITORING 07/22/20   [provider]  fexofenadine (ALLEGRA) 180 MG tablet Take 1 tablet (180 mg total) by mouth daily. Patient not taking: Reported on 11/01/2020 03/17/20 03/17/21  Biagio Borg, MD  gabapentin (NEURONTIN) 300 MG capsule 1-2 tab by mouth at bedtime as needed 03/17/20   Biagio Borg, MD  glycopyrrolate (ROBINUL) 2 MG tablet Take 1 tablet by mouth twice daily 12/16/20   Ladene Artist, MD  heparin 1000 unit/mL SOLN injection Heparin Sodium (Porcine) 1,000 Units/mL Systemic 05/27/20 05/26/21  [provider]  heparin 1000 unit/mL SOLN injection Heparin Sodium (Porcine) 1,000 Units/mL Systemic 03/28/20 03/27/21  [provider]  HYDROcodone bit-homatropine (HYCODAN) 5-1.5 MG/5ML syrup Take 5 mLs by mouth every 6 (six) hours as needed for up to 10 days for cough. 12/20/20 12/30/20  Biagio Borg, MD  insulin aspart (NOVOLOG) 100 UNIT/ML injection INJECT 8 TO 12 UNITS INTO THE SKIN THREE TIMES DAILY WITH MEALS 10/10/20   Biagio Borg, MD  insulin glargine (LANTUS) 100 UNIT/ML injection Inject 0.25 mLs (25 Units total) into the skin at bedtime. 10/25/17   Rai, Vernelle Emerald, MD  levETIRAcetam (KEPPRA) 250 MG tablet Take 1 tablet (250 mg total) by mouth at bedtime. 12/16/20   Narda Amber K, DO  meclizine (ANTIVERT) 12.5 MG tablet TAKE 1 TABLET BY MOUTH THREE TIMES DAILY AS NEEDED FOR DIZZINESS 05/28/20   Biagio Borg, MD  molnupiravir EUA 200 mg CAPS  Take 4 capsules (800 mg total) by mouth 2 (two) times daily for 5 days. 12/20/20 12/25/20  Jacquelin Hawking, NP  multivitamin (RENA-VIT) TABS tablet Take by mouth. 03/20/19   [provider]  omeprazole (PRILOSEC) 40 MG capsule Take 1 capsule (40 mg total) by mouth 2 (two) times daily. 11/26/19   Ladene Artist, MD  ondansetron (ZOFRAN) 4 MG tablet Take 1 tablet (4 mg total) by mouth every 8 (eight) hours as needed for nausea or vomiting. 12/20/20   Biagio Borg, MD  polyethylene glycol powder [facility]) 17 GM/SCOOP powder Take by mouth. 10/25/17   [provider]  rOPINIRole (REQUIP) 1 MG tablet Take by mouth. 08/12/19   [provider]  rosuvastatin (CRESTOR) 10 MG tablet Take 1 tablet (10 mg total)  by mouth daily. 10/25/17   Rai, Vernelle Emerald, MD  Tdap (ADACEL) 12-12-13.5 LF-MCG/0.5 injection Inject into the muscle. 10/26/20   [provider]  triamcinolone (NASACORT) 55 MCG/ACT AERO nasal inhaler Place 2 sprays into the nose daily. 05/21/20   McGowen, Adrian Blackwater, MD  Tuberculin PPD (TUBERSOL ID) Inject into the skin.  10/26/19   [provider]    Allergies    Patient has no known allergies.  Review of Systems   Review of Systems  Physical Exam Updated Vital Signs BP (!) 144/89 (BP Location: Right Arm)   Pulse (!) 122   Temp 99.4 F (37.4 C) (Oral)   Resp (!) 22   SpO2 99%   Physical Exam  ED Results / Procedures / Treatments   Labs (all labs ordered are listed, but only abnormal results are displayed) Labs Reviewed - No data to display  EKG None  Radiology No results found.  Procedures Procedures {Remember to document critical care time when appropriate:1}  Medications Ordered in ED Medications - No data to display  ED Course  I have reviewed the triage vital signs and the nursing notes.  Pertinent labs & imaging results that were available during my care of the patient were reviewed by me and considered in my medical  decision making (see chart for details).    MDM Rules/Calculators/A&P                          *** Final Clinical Impression(s) / ED Diagnoses Final diagnoses:  None    Rx / DC Orders ED Discharge Orders    None

## 2020-12-22 NOTE — Consult Note (Signed)
[facility] Renal Consultation Note  Requesting MD: Karmen Bongo, MD Indication for Consultation:  ESRD, hyperkalemia  Chief complaint: shortness of breath   HPI Hector Mahoney is a 50 y.o. male with a history of ESRD on home hemodialysis, HTN, and chronic diastolic CHF who presented to the ER with worsening shortness of breath.  He tested positive for covid on 5/7 and 5/9 at home.  He has been given molnupiravir without improvement in his symptoms.  Previously tested negative at urgent care after exposure to his brother.  He has been short of breath when lying flat and with exertion.  He was too tired to do his HD on Tuesday.  He normally does HD on MTuesThurs and Friday.  He has been on home HD for about 2.5 years.  His dry weight is 94.5 kg and he is not on ESA.  He follows with Dr. Marval Regal at [facility].  The patient manages his care and is normally very functional and states he is listed for transplant.    PMHx:   Past Medical History:  Diagnosis Date  . Anemia   . CHF (congestive heart failure) ([place])   . DIABETES MELLITUS, TYPE I 07/30/2007  . Esophageal reflux 04/27/2009  . ESRD (end stage renal disease) on dialysis ([place])    home HD 4x/week  . HYPERLIPIDEMIA 04/24/2007  . HYPERTENSION 04/24/2007    Past Surgical History:  Procedure Laterality Date  . AV FISTULA PLACEMENT Left 06/04/2019   Procedure: BRACHIAL-CEPHALIC ARTERIOVENOUS (AV) FISTULA CREATION LEFT ARM;  Surgeon: Rosetta Posner, MD;  Location: [facility] OR;  Service: Vascular;  Laterality: Left;  . COLONOSCOPY     polyp  . IR FLUORO GUIDE CV LINE RIGHT  10/22/2017  . IR US GUIDE VASC ACCESS RIGHT  10/22/2017  . MOUTH SURGERY     tooth ext    Family Hx:  Family History  Problem Relation Age of Onset  . Hypertension Mother   . Diabetes Mother   . Diabetes Maternal Aunt   . Lung cancer Maternal Grandfather   . Colon polyps Maternal Uncle   . Colon cancer Maternal Uncle   . Heart disease  Maternal Uncle   . Kidney disease Neg Hx   . Esophageal cancer Neg Hx   . Rectal cancer Neg Hx   . Stomach cancer Neg Hx   no family hx of ESRD  Social History:  reports that he quit smoking about 5 years ago. His smoking use included cigarettes. He has never used smokeless tobacco. He reports current alcohol use. He reports that he does not use drugs.  Allergies: No Known Allergies  Medications: Prior to Admission medications   Medication Sig Start Date End Date Taking? Authorizing Provider  acetaminophen (TYLENOL) 500 MG tablet Take 500 mg by mouth every 6 (six) hours as needed for mild pain. 05/06/20 05/10/21 Yes [provider]  albuterol (VENTOLIN HFA) 108 (90 Base) MCG/ACT inhaler Inhale 2 puffs into the lungs every 6 (six) hours as needed for wheezing or shortness of breath. 12/20/20  Yes Biagio Borg, MD  amLODipine (NORVASC) 5 MG tablet Take 1 tablet (5 mg total) by mouth at bedtime. 11/01/20  Yes Biagio Borg, MD  aspirin EC 81 MG EC tablet Take 1 tablet (81 mg total) by mouth daily. 09/06/15  Yes Nita Sells, MD  atorvastatin (LIPITOR) 80 MG tablet Take 80 mg by mouth daily. 02/26/18  Yes [provider]  calcitRIOL (ROCALTROL) 0.25 MCG capsule Take  1 capsule (0.25 mcg total) by mouth every Monday, Wednesday, and Friday at 6 PM. 09/30/17  Yes Thurnell Lose, MD  calcium acetate (PHOSLO) 667 MG capsule Take 3 capsules (2,001 mg total) by mouth 3 (three) times daily with meals. 11/01/20  Yes Biagio Borg, MD  cinacalcet (SENSIPAR) 30 MG tablet Take 60 mg by mouth every evening.   Yes [provider]  Continuous Blood Gluc Sensor (FREESTYLE LIBRE 14 DAY SENSOR) MISC 1 each by Other route daily. 07/22/20  Yes [provider]  fexofenadine (ALLEGRA) 180 MG tablet Take 1 tablet (180 mg total) by mouth daily. 03/17/20 03/17/21 Yes Biagio Borg, MD  gabapentin (NEURONTIN) 300 MG capsule 1-2 tab by mouth at bedtime as needed Patient taking  differently: Take 300-600 mg by mouth at bedtime as needed (pain). 03/17/20  Yes Biagio Borg, MD  glycopyrrolate (ROBINUL) 2 MG tablet Take 1 tablet by mouth twice daily Patient taking differently: Take 2 mg by mouth 2 (two) times daily. 12/16/20  Yes Ladene Artist, MD  heparin 1000 unit/mL SOLN injection 1,000 Units by Dialysis route one time in dialysis. On Dialysis days (4 times per week) 05/27/20 05/26/21 Yes [provider]  HYDROcodone bit-homatropine (HYCODAN) 5-1.5 MG/5ML syrup Take 5 mLs by mouth every 6 (six) hours as needed for up to 10 days for cough. 12/20/20 12/30/20 Yes Biagio Borg, MD  insulin aspart (NOVOLOG) 100 UNIT/ML injection INJECT 8 TO 12 UNITS INTO THE SKIN THREE TIMES DAILY WITH MEALS Patient taking differently: Inject 8-12 Units into the skin 3 (three) times daily with meals. 10/10/20  Yes Biagio Borg, MD  insulin glargine (LANTUS) 100 UNIT/ML injection Inject 0.25 mLs (25 Units total) into the skin at bedtime. 10/25/17  Yes Rai, Ripudeep K, MD  levETIRAcetam (KEPPRA) 250 MG tablet Take 1 tablet (250 mg total) by mouth at bedtime. 12/16/20  Yes Patel, Donika K, DO  meclizine (ANTIVERT) 12.5 MG tablet TAKE 1 TABLET BY MOUTH THREE TIMES DAILY AS NEEDED FOR DIZZINESS Patient taking differently: Take 12.5 mg by mouth 3 (three) times daily as needed for dizziness. 05/28/20  Yes Biagio Borg, MD  molnupiravir EUA 200 mg CAPS Take 4 capsules (800 mg total) by mouth 2 (two) times daily for 5 days. Patient taking differently: Take 4 capsules by mouth every 6 (six) hours. 12/20/20 12/25/20 Yes Burns, Wandra Feinstein, NP  multivitamin (RENA-VIT) TABS tablet Take 1 tablet by mouth daily. 03/20/19  Yes [provider]  omeprazole (PRILOSEC) 40 MG capsule Take 1 capsule (40 mg total) by mouth 2 (two) times daily. 11/26/19  Yes Ladene Artist, MD  polyethylene glycol powder (GLYCOLAX/MIRALAX) 17 GM/SCOOP powder Take 1 Container by mouth daily as needed for mild constipation.  10/25/17  Yes [provider]  rOPINIRole (REQUIP) 1 MG tablet Take 1 mg by mouth at bedtime. 08/12/19  Yes [provider]  rosuvastatin (CRESTOR) 10 MG tablet Take 1 tablet (10 mg total) by mouth daily. 10/25/17  Yes Rai, Ripudeep K, MD  triamcinolone (NASACORT) 55 MCG/ACT AERO nasal inhaler Place 2 sprays into the nose daily. Patient taking differently: Place 2 sprays into the nose daily as needed (allergies). 05/21/20  Yes McGowen, Adrian Blackwater, MD  ondansetron (ZOFRAN) 4 MG tablet Take 1 tablet (4 mg total) by mouth every 8 (eight) hours as needed for nausea or vomiting. 12/20/20   Biagio Borg, MD    I have reviewed the patient's current and prior to admission  medications.  Labs:  BMP Latest Ref Rng & Units 12/22/2020 12/22/2020 06/25/2019  Glucose 70 - 99 mg/dL - 185(H) 173(H)  BUN 6 - 20 mg/dL - 78(H) 51(H)  Creatinine 0.61 - 1.24 mg/dL - 16.35(H) 8.83(HH)  Sodium 135 - 145 mmol/L 133(L) 133(L) 141  Potassium 3.5 - 5.1 mmol/L 7.0(HH) 7.1(HH) 4.3  Chloride 98 - 111 mmol/L - 92(L) 97  CO2 22 - 32 mmol/L - 27 29  Calcium 8.9 - 10.3 mg/dL - 8.3(L) 10.0     ROS:  Pertinent items noted in HPI and remainder of comprehensive ROS otherwise negative.   Physical Exam: Vitals:   12/22/20 1400 12/22/20 1415  BP: (!) 156/97 (!) 150/98  Pulse: (!) 109 (!) 114  Resp: 16 (!) 22  Temp:    SpO2: 100% 98%     General:  Adult male in bed in NAD at rest HEENT: NCAT Eyes: EOMI sclera anicteric Neck: supple trachea midline Heart: S1S2 tachy; no rub Lungs: Clear but diminished; on 2 liters oxygen  Abdomen: soft nt obese habitus  Extremities: no pitting edema; no cyanosis or clubbing  Skin: no rash on extremities exposed  Neuro: alert and oriented x 3 provides hx and follows commands LUE AVF is accessed for HD  Assessment/Plan:  # ESRD - HD urgently - undergoing now - Plan for HD on 5/13 as well   - stopped the PRN fleet's enema as he is ESRD  # Hyperkalemia  - He had a  0 K bath for 30 minutes then we transitioned to a 2K bath  - Changed to renal diet  # Covid 19 pneumonia - Therapies per primary team - on solumedrol and remdesivir - on supplemental oxygen per nasal cannula  # HTN  - UF with HD  - monitor on home regimen   # Chronic diastolic CHF - optimize volume with HD  # Anemia CKD - Hb at goal - no acute indication for ESA   # Metabolic bone disease  - on sensipar 60 mg daily and calcitriol 0.25 mcg MWF at home - continue here  Claudia Desanctis 12/22/2020, 3:15 PM

## 2020-12-22 NOTE — Progress Notes (Signed)
   12/22/20 1322  Assess: MEWS Score  Temp 99 F (37.2 C)  BP (!) 144/89  Pulse Rate (!) 115  ECG Heart Rate (!) 115  Resp 12  SpO2 95 %  O2 Device Nasal Cannula  O2 Flow Rate (L/min) 2 L/min  Assess: MEWS Score  MEWS Temp 0  MEWS Systolic 0  MEWS Pulse 2  MEWS RR 1  MEWS LOC 0  MEWS Score 3  MEWS Score Color Yellow  Assess: if the MEWS score is Yellow or Red  Were vital signs taken at a resting state? Yes  Focused Assessment No change from prior assessment  Early Detection of Sepsis Score *See Row Information* Low  MEWS guidelines implemented *See Row Information* Yes  Treat  MEWS Interventions Escalated (See documentation below)  Take Vital Signs  Increase Vital Sign Frequency  Yellow: Q 2hr X 2 then Q 4hr X 2, if remains yellow, continue Q 4hrs  Escalate  MEWS: Escalate Yellow: discuss with charge nurse/RN and consider discussing with provider and RRT  Notify: Charge Nurse/RN  Name of Charge Nurse/RN Notified Liana RN  Date Charge Nurse/RN Notified 12/22/20  Time Charge Nurse/RN Notified 1401  Document  Patient Outcome Other (Comment) (Pt stable, HD in room)  Progress note created (see row info) Yes

## 2020-12-22 NOTE — H&P (Signed)
History and Physical    KOLSON CHOVANEC AST:[id number] DOB: 10-05-1970 DOA: 12/22/2020  PCP: Biagio Borg, MD Consultants:  Posey Pronto - neurology; Fuller Plan - GI; Early - vascular; nephrology Patient coming from: Home - lives with wife; NOK: Wife, Markey Deady, [phone]   Chief Complaint: Worsening COVID symptoms  HPI: Hector Mahoney is a 50 y.o. male with medical history significant of HTN; HLD; DM (type 1); ESRD on 4x/week home HD; and chronic diastolic CHF presenting with SOB, known COVID-19 infection.  He has been taking molnupiravir without improvement.  He reports that he was exposed to his brother, who had COVID-19.  He went to UC on 5/4 and had negative rapid and PCR tests.  He developed low-grade fever and sore throat and so returned to UC for repeat tests on 5/6; both rapid and PCR were still negative.  He took a home test on 5/7 and another on 5/9 that were both positive.  He returned to UC on 5/9 and had a rapid positive test then.  Since then, he has noticed severe fatigue, SOB with lying flat and with exertion, cough, and generalized malaise.  He was too fatigued to do home HD on Tuesday.  He is on transplant lists at the [place] [facility] [place].    ED Course:  T1DM, ESRD on HD.  Has COVID, day 8.  Missed HD x 1 at home, doesn't look terribly fluid overloaded.  Nephrology will dialyze, K+ 7.1.  On 2L [place] O2.  Given calcium gluconate, insulin/dextrose, albuterol.  QTc looks prolonged.    Review of Systems: As per HPI; otherwise review of systems reviewed and negative.   Ambulatory Status:  Ambulates without assistance  COVID Vaccine Status:   Complete plus booster  Past Medical History:  Diagnosis Date  . Anemia   . CHF (congestive heart failure) ([place])   . DIABETES MELLITUS, TYPE I 07/30/2007  . Esophageal reflux 04/27/2009  . ESRD (end stage renal disease) on dialysis ([place])    home HD 4x/week  . HYPERLIPIDEMIA 04/24/2007  . HYPERTENSION 04/24/2007    Past Surgical History:   Procedure Laterality Date  . AV FISTULA PLACEMENT Left 06/04/2019   Procedure: BRACHIAL-CEPHALIC ARTERIOVENOUS (AV) FISTULA CREATION LEFT ARM;  Surgeon: Rosetta Posner, MD;  Location: [facility] OR;  Service: Vascular;  Laterality: Left;  . COLONOSCOPY     polyp  . IR FLUORO GUIDE CV LINE RIGHT  10/22/2017  . IR US GUIDE VASC ACCESS RIGHT  10/22/2017  . MOUTH SURGERY     tooth ext    Social History   Socioeconomic History  . Marital status: Married    Spouse name: Not on file  . Number of children: 0  . Years of education: Not on file  . Highest education level: Not on file  Occupational History  . Not on file  Tobacco Use  . Smoking status: Former Smoker    Types: Cigarettes    Quit date: 05/03/2015    Years since quitting: 5.6  . Smokeless tobacco: Never Used  . Tobacco comment: 12/22/2012 "used to smoke cigarettes once or twice/month; haven't had any cigarettes for years"  Vaping Use  . Vaping Use: Never used  Substance and Sexual Activity  . Alcohol use: Yes    Comment: occasional   . Drug use: No  . Sexual activity: Yes  Other Topics Concern  . Not on file  Social History Narrative   Right Handed   Lives in a one story home  Drinks little caffeine    Social Determinants of Radio broadcast assistant Strain: Not on file  Food Insecurity: Not on file  Transportation Needs: Not on file  Physical Activity: Not on file  Stress: Not on file  Social Connections: Not on file  Intimate Partner Violence: Not on file    No Known Allergies  Family History  Problem Relation Age of Onset  . Hypertension Mother   . Diabetes Mother   . Diabetes Maternal Aunt   . Lung cancer Maternal Grandfather   . Colon polyps Maternal Uncle   . Colon cancer Maternal Uncle   . Heart disease Maternal Uncle   . Kidney disease Neg Hx   . Esophageal cancer Neg Hx   . Rectal cancer Neg Hx   . Stomach cancer Neg Hx     Prior to Admission medications   Medication Sig Start Date End Date  Taking? Authorizing Provider  acetaminophen (TYLENOL) 500 MG tablet Take 500 mg by mouth every 6 (six) hours as needed for mild pain. 05/06/20 05/10/21 Yes [provider]  albuterol (VENTOLIN HFA) 108 (90 Base) MCG/ACT inhaler Inhale 2 puffs into the lungs every 6 (six) hours as needed for wheezing or shortness of breath. 12/20/20  Yes Biagio Borg, MD  amLODipine (NORVASC) 5 MG tablet Take 1 tablet (5 mg total) by mouth at bedtime. 11/01/20  Yes Biagio Borg, MD  aspirin EC 81 MG EC tablet Take 1 tablet (81 mg total) by mouth daily. 09/06/15  Yes Nita Sells, MD  atorvastatin (LIPITOR) 80 MG tablet Take 80 mg by mouth daily. 02/26/18  Yes [provider]  calcitRIOL (ROCALTROL) 0.25 MCG capsule Take 1 capsule (0.25 mcg total) by mouth every Monday, Wednesday, and Friday at 6 PM. 09/30/17  Yes Thurnell Lose, MD  calcium acetate (PHOSLO) 667 MG capsule Take 3 capsules (2,001 mg total) by mouth 3 (three) times daily with meals. 11/01/20  Yes Biagio Borg, MD  cinacalcet (SENSIPAR) 30 MG tablet Take 60 mg by mouth every evening.   Yes [provider]  Continuous Blood Gluc Sensor (FREESTYLE LIBRE 14 DAY SENSOR) MISC 1 each by Other route daily. 07/22/20  Yes [provider]  fexofenadine (ALLEGRA) 180 MG tablet Take 1 tablet (180 mg total) by mouth daily. 03/17/20 03/17/21 Yes Biagio Borg, MD  gabapentin (NEURONTIN) 300 MG capsule 1-2 tab by mouth at bedtime as needed Patient taking differently: Take 300-600 mg by mouth at bedtime as needed (pain). 03/17/20  Yes Biagio Borg, MD  glycopyrrolate (ROBINUL) 2 MG tablet Take 1 tablet by mouth twice daily Patient taking differently: Take 2 mg by mouth 2 (two) times daily. 12/16/20  Yes Ladene Artist, MD  heparin 1000 unit/mL SOLN injection 1,000 Units by Dialysis route one time in dialysis. On Dialysis days (4 times per week) 05/27/20 05/26/21 Yes [provider]  HYDROcodone bit-homatropine (HYCODAN) 5-1.5  MG/5ML syrup Take 5 mLs by mouth every 6 (six) hours as needed for up to 10 days for cough. 12/20/20 12/30/20 Yes Biagio Borg, MD  insulin aspart (NOVOLOG) 100 UNIT/ML injection INJECT 8 TO 12 UNITS INTO THE SKIN THREE TIMES DAILY WITH MEALS Patient taking differently: Inject 8-12 Units into the skin 3 (three) times daily with meals. 10/10/20  Yes Biagio Borg, MD  insulin glargine (LANTUS) 100 UNIT/ML injection Inject 0.25 mLs (25 Units total) into the skin at bedtime. 10/25/17  Yes Rai, Vernelle Emerald, MD  levETIRAcetam (  KEPPRA) 250 MG tablet Take 1 tablet (250 mg total) by mouth at bedtime. 12/16/20  Yes Patel, Donika K, DO  meclizine (ANTIVERT) 12.5 MG tablet TAKE 1 TABLET BY MOUTH THREE TIMES DAILY AS NEEDED FOR DIZZINESS Patient taking differently: Take 12.5 mg by mouth 3 (three) times daily as needed for dizziness. 05/28/20  Yes Biagio Borg, MD  molnupiravir EUA 200 mg CAPS Take 4 capsules (800 mg total) by mouth 2 (two) times daily for 5 days. Patient taking differently: Take 4 capsules by mouth every 6 (six) hours. 12/20/20 12/25/20 Yes Burns, Wandra Feinstein, NP  multivitamin (RENA-VIT) TABS tablet Take 1 tablet by mouth daily. 03/20/19  Yes [provider]  omeprazole (PRILOSEC) 40 MG capsule Take 1 capsule (40 mg total) by mouth 2 (two) times daily. 11/26/19  Yes Ladene Artist, MD  polyethylene glycol powder (GLYCOLAX/MIRALAX) 17 GM/SCOOP powder Take 1 Container by mouth daily as needed for mild constipation. 10/25/17  Yes [provider]  rOPINIRole (REQUIP) 1 MG tablet Take 1 mg by mouth at bedtime. 08/12/19  Yes [provider]  rosuvastatin (CRESTOR) 10 MG tablet Take 1 tablet (10 mg total) by mouth daily. 10/25/17  Yes Rai, Ripudeep K, MD  triamcinolone (NASACORT) 55 MCG/ACT AERO nasal inhaler Place 2 sprays into the nose daily. Patient taking differently: Place 2 sprays into the nose daily as needed (allergies). 05/21/20  Yes McGowen, Adrian Blackwater, MD  ondansetron (ZOFRAN) 4  MG tablet Take 1 tablet (4 mg total) by mouth every 8 (eight) hours as needed for nausea or vomiting. 12/20/20   Biagio Borg, MD    Physical Exam: Vitals:   12/22/20 0900 12/22/20 1000 12/22/20 1200 12/22/20 1214  BP: 131/85 123/84  130/79  Pulse: (!) 116 (!) 110  (!) 114  Resp: (!) 21 16  18   Temp:    99 F (37.2 C)  TempSrc:    Oral  SpO2: 100% 100%  93%  Weight:   98 kg      . General:  Appears calm and comfortable and is in NAD, on 2L [place] O2 . Eyes:  PERRL, EOMI, normal lids, iris . ENT:  grossly normal hearing, lips & tongue, mmm . Neck:  no LAD, masses or thyromegaly . Cardiovascular:  RR with tachycardia, no m/r/g. No LE edema.  Marland Kitchen Respiratory:   Scattered rhonchi.  Normal to mildly increased respiratory effort, on 2L [place] O2. . Abdomen:  soft, NT, ND . Back:   normal alignment . Skin:  no rash or induration seen on limited exam . Musculoskeletal:  grossly normal tone BUE/BLE, good ROM, no bony abnormality . Psychiatric:  grossly normal mood and affect, speech fluent and appropriate, AOx3 . Neurologic:  CN 2-12 grossly intact, moves all extremities in coordinated fashion    Radiological Exams on Admission: Independently reviewed - see discussion in A/P where applicable  DG Chest 1 View  Result Date: 12/22/2020 CLINICAL DATA:  Shortness of breath EXAM: CHEST  1 VIEW COMPARISON:  10/20/2017 FINDINGS: Heart size within normal limits. No pulmonary vascular congestion. There is patchy bilateral lung opacities. IMPRESSION: Patchy bilateral lung opacities may be due to multifocal pneumonia or pulmonary edema/fluid volume overload. Electronically Signed   By: Miachel Roux M.D.   On: 12/22/2020 08:45    EKG: Independently reviewed.   0723 - Sinus tachycardia with rate 123; nonspecific ST changes concerning for ischemia 0729 - Sinus tachycardia with rate 123; nonspecific ST changes that are improved from prior  Labs on Admission: I have personally reviewed the available labs  and imaging studies at the time of the admission.  Pertinent labs:   VBG: 7.373/53.5/31.1 K+ 7.1 Glucose 185 BUN 78/Creatinine 16.35/GFR 3 WBC 11.9 Hgb 10.4  Assessment/Plan Principal Problem:   COVID-19 Active Problems:   Type 1 diabetes mellitus (HCC)   Dyslipidemia   Essential hypertension   ESRD on hemodialysis ([place])    COVID-19 Infection -Patient with presenting with SOB and reported fever at home; also with cough  -He does not have a usual home O2 requirement and is currently requiring 2L [place] O2 -COVID POSITIVE (in [place]) -The patient has comorbidities which may increase the risk for ARDS/MODS including:  HTN, DM, obesity, CKD -COVID labs are pending -CXR with multifocal opacities which may be c/w COVID vs. Multifocal PNA vs. Pulmonary edema associated with missed HD -Will treat with broad-spectrum antibiotics if procalcitonin >0.5.   -Will admit for further evaluation, close monitoring, and treatment -Monitor on telemetry x at least 24 hours -At this time, will attempt to avoid use of aerosolized medications and use HFAs instead -Will check daily labs including BMP with Mag, Phos; LFTs; CBC with differential; CRP; ferritin; fibrinogen; D-dimer -Will order steroids and Remdesivir (pharmacy consult) given +COVID test, +CXR, and hypoxia <94% on room air -If the patient shows clinical deterioration, consider transfer to ICU with PCCM consultation -He has already been treated with molnupiravir (Paxlovid) x 3 doses without improvement; if respiratory status worsens, will need to discuss with ID/pulm/pharmacy to determine next appropriate steps of treatment -Will attempt to maintain euvolemia to a net negative fluid status -Will ask the patient to maintain an awake prone position as much as possible -PT/OT consults -Encourage mobilization/ambulation as much as possible -Patient was seen wearing full PPE including: gown, gloves, head cover, N95, and face shield;  donning and doffing was in compliance with current standards.  ESRD on home HD, with hyperkalemia -Patient on chronic MWTS home HD -Nephrology prn order set utilized -He presented with significant hyperkalemia and has been treated with calcium gluconate, insulin/glucose, and a dose of kayexalate -Nephrology is aware that patient will need HD -He will be admitted to telemetry for now given the hyperkalemia -Continue Calcitriol, Phoslo, Sensipar -Continue Keppra for myoclonic jerks associated with electrolyte changes following HD  DM -Will check A1c -May utilize continuous glucose monitoring -hold Trulicity, Glucophage -Continue glargine -Cover with very sensitive-scale SSI -Continue Neurontin  HTN -Continue Norvasc  HLD -Continue Lipitor -Of note, he appeared to be taking both 80 mg Lipitor plus 10 mg Crestor; Crestor is held -Per last clinic note, he appears to be supposed to be taking Crestor - but Lipitor 80 is more potent so will continue that one for now  Chronic diastolic CHF -Volume management with HD  Obesity -BMI is 34.87 -Weight loss should be encouraged -Outpatient PCP/bariatric medicine f/u encouraged    Level of care: Telemetry Medical DVT prophylaxis:  Heparin Code Status:  Full - confirmed with patient Family Communication: None present; I spoke with the patient's wife by telephone. Disposition Plan:  The patient is from: home  Anticipated d/c is to: home without [facility] services once his respiratory issues have been resolved.    Anticipated d/c date will depend on clinical response to treatment, likely between 3 days (with completion of outpatient Remdesivir treatment) and 5 days  Patient is currently: acutely ill Consults called:  Nephrology; PT/OT; RT  Admission status: Admit - It is my clinical opinion that admission  to INPATIENT is reasonable and necessary because of the expectation that this patient will require hospital care that crosses at least 2  midnights to treat this condition based on the medical complexity of the problems presented.  Given the aforementioned information, the predictability of an adverse outcome is felt to be significant.     Karmen Bongo MD [facility]   How to contact the [facility] Attending or Consulting provider [place] or covering provider during after hours [place], for this patient?  1. Check the care team in [facility] and look for a) attending/consulting [facility] provider listed and b) the [facility] team listed 2. Log into [email or web address] and use [place]'s universal password to access. If you do not have the password, please contact the hospital operator. 3. Locate the [facility] provider you are looking for under [facility] and page to a number that you can be directly reached. 4. If you still have difficulty reaching the provider, please page the [facility] (Director on Call) for the Hospitalists listed on amion for assistance.   12/22/2020, 12:49 PM

## 2020-12-23 DIAGNOSIS — N185 Chronic kidney disease, stage 5: Secondary | ICD-10-CM

## 2020-12-23 DIAGNOSIS — E1022 Type 1 diabetes mellitus with diabetic chronic kidney disease: Secondary | ICD-10-CM

## 2020-12-23 DIAGNOSIS — Z992 Dependence on renal dialysis: Secondary | ICD-10-CM

## 2020-12-23 DIAGNOSIS — U071 COVID-19: Principal | ICD-10-CM

## 2020-12-23 DIAGNOSIS — N186 End stage renal disease: Secondary | ICD-10-CM

## 2020-12-23 DIAGNOSIS — I10 Essential (primary) hypertension: Secondary | ICD-10-CM

## 2020-12-23 LAB — BLOOD CULTURE ID PANEL (REFLEXED) - BCID2

## 2020-12-23 LAB — CBC WITH DIFFERENTIAL/PLATELET
Abs Immature Granulocytes: 0.09 10*3/uL — ABNORMAL HIGH (ref 0.00–0.07)
Basophils Absolute: 0 10*3/uL (ref 0.0–0.1)
Basophils Relative: 0 %
Eosinophils Absolute: 0 10*3/uL (ref 0.0–0.5)
Eosinophils Relative: 0 %
HCT: 30.4 % — ABNORMAL LOW (ref 39.0–52.0)
Hemoglobin: 9.8 g/dL — ABNORMAL LOW (ref 13.0–17.0)
Immature Granulocytes: 1 %
Lymphocytes Relative: 9 %
Lymphs Abs: 0.9 10*3/uL (ref 0.7–4.0)
MCH: 26.9 pg (ref 26.0–34.0)
MCHC: 32.2 g/dL (ref 30.0–36.0)
MCV: 83.5 fL (ref 80.0–100.0)
Monocytes Absolute: 0.2 10*3/uL (ref 0.1–1.0)
Monocytes Relative: 3 %
Neutro Abs: 8.4 10*3/uL — ABNORMAL HIGH (ref 1.7–7.7)
Neutrophils Relative %: 87 %
Platelets: 262 10*3/uL (ref 150–400)
RBC: 3.64 MIL/uL — ABNORMAL LOW (ref 4.22–5.81)
RDW: 14.6 % (ref 11.5–15.5)
WBC: 9.7 10*3/uL (ref 4.0–10.5)
nRBC: 0 % (ref 0.0–0.2)

## 2020-12-23 LAB — COMPREHENSIVE METABOLIC PANEL
ALT: 21 U/L (ref 0–44)
AST: 18 U/L (ref 15–41)
Albumin: 3.1 g/dL — ABNORMAL LOW (ref 3.5–5.0)
Alkaline Phosphatase: 72 U/L (ref 38–126)
Anion gap: 14 (ref 5–15)
BUN: 44 mg/dL — ABNORMAL HIGH (ref 6–20)
CO2: 27 mmol/L (ref 22–32)
Calcium: 8.5 mg/dL — ABNORMAL LOW (ref 8.9–10.3)
Chloride: 92 mmol/L — ABNORMAL LOW (ref 98–111)
Creatinine, Ser: 10.91 mg/dL — ABNORMAL HIGH (ref 0.61–1.24)
GFR, Estimated: 5 mL/min — ABNORMAL LOW (ref >60)
Glucose, Bld: 239 mg/dL — ABNORMAL HIGH (ref 70–99)
Potassium: 5.8 mmol/L — ABNORMAL HIGH (ref 3.5–5.1)
Sodium: 133 mmol/L — ABNORMAL LOW (ref 135–145)
Total Bilirubin: 0.7 mg/dL (ref 0.3–1.2)
Total Protein: 8.3 g/dL — ABNORMAL HIGH (ref 6.5–8.1)

## 2020-12-23 LAB — MAGNESIUM: Magnesium: 2 mg/dL (ref 1.7–2.4)

## 2020-12-23 LAB — FERRITIN: Ferritin: 1510 ng/mL — ABNORMAL HIGH (ref 24–336)

## 2020-12-23 LAB — D-DIMER, QUANTITATIVE: D-Dimer, Quant: 2.03 ug/mL-FEU — ABNORMAL HIGH (ref 0.00–0.50)

## 2020-12-23 LAB — GLUCOSE, CAPILLARY
Glucose-Capillary: 313 mg/dL — ABNORMAL HIGH (ref 70–99)
Glucose-Capillary: 324 mg/dL — ABNORMAL HIGH (ref 70–99)
Glucose-Capillary: 160 mg/dL — ABNORMAL HIGH (ref 70–99)
Glucose-Capillary: 275 mg/dL — ABNORMAL HIGH (ref 70–99)

## 2020-12-23 LAB — C-REACTIVE PROTEIN: CRP: 38.3 mg/dL — ABNORMAL HIGH (ref <1.0)

## 2020-12-23 LAB — SARS CORONAVIRUS 2 (TAT 6-24 HRS): SARS Coronavirus 2: POSITIVE — AB

## 2020-12-23 LAB — PHOSPHORUS: Phosphorus: 5 mg/dL — ABNORMAL HIGH (ref 2.5–4.6)

## 2020-12-23 MED ORDER — SODIUM ZIRCONIUM CYCLOSILICATE 10 G PO PACK
10.0000 g | PACK | Freq: Two times a day (BID) | ORAL | Status: AC
  Administered 2020-12-23 (×2): 10 g via ORAL
  Filled 2020-12-23 (×2): qty 1

## 2020-12-23 MED ORDER — VANCOMYCIN VARIABLE DOSE PER UNSTABLE RENAL FUNCTION (PHARMACIST DOSING): Status: DC

## 2020-12-23 MED ORDER — VANCOMYCIN HCL 2000 MG/400ML IV SOLN
2000.0000 mg | Freq: Once | INTRAVENOUS | Status: AC
  Administered 2020-12-23: 2000 mg via INTRAVENOUS
  Filled 2020-12-23: qty 400

## 2020-12-23 NOTE — Progress Notes (Signed)
PHARMACY - PHYSICIAN COMMUNICATION CRITICAL VALUE ALERT - BLOOD CULTURE IDENTIFICATION (BCID)  Hector Mahoney is an 50 y.o. male who presented to [facility] on 12/22/2020 with a chief complaint of SOB found to have [place]. Patient's 1 blood culture set growing S. Epi in the anaerobic bottle.  Assessment:  Bcx 1/2 S. Epi mecA + - could be contamination   Name of physician (or Provider) Contacted: G[place]  Current antibiotics: None  Changes to prescribed antibiotics recommended:  Start vancomycin per MD as provider concerned for true bacteremia given patient with HD fistula  Results for orders placed or performed during the hospital encounter of 12/22/20  Blood Culture ID Panel (Reflexed) (Collected: 12/22/2020  1:57 PM)  Result Value Ref Range   Enterococcus faecalis NOT DETECTED NOT DETECTED   Enterococcus Faecium NOT DETECTED NOT DETECTED   Listeria monocytogenes NOT DETECTED NOT DETECTED   Staphylococcus species DETECTED (A) NOT DETECTED   Staphylococcus aureus (BCID) NOT DETECTED NOT DETECTED   Staphylococcus epidermidis DETECTED (A) NOT DETECTED   Staphylococcus lugdunensis NOT DETECTED NOT DETECTED   Streptococcus species NOT DETECTED NOT DETECTED   Streptococcus agalactiae NOT DETECTED NOT DETECTED   Streptococcus pneumoniae NOT DETECTED NOT DETECTED   Streptococcus pyogenes NOT DETECTED NOT DETECTED   A.calcoaceticus-baumannii NOT DETECTED NOT DETECTED   Bacteroides fragilis NOT DETECTED NOT DETECTED   Enterobacterales NOT DETECTED NOT DETECTED   Enterobacter cloacae complex NOT DETECTED NOT DETECTED   Escherichia coli NOT DETECTED NOT DETECTED   Klebsiella aerogenes NOT DETECTED NOT DETECTED   Klebsiella oxytoca NOT DETECTED NOT DETECTED   Klebsiella pneumoniae NOT DETECTED NOT DETECTED   Proteus species NOT DETECTED NOT DETECTED   Salmonella species NOT DETECTED NOT DETECTED   Serratia marcescens NOT DETECTED NOT DETECTED   Haemophilus influenzae NOT DETECTED NOT  DETECTED   Neisseria meningitidis NOT DETECTED NOT DETECTED   Pseudomonas aeruginosa NOT DETECTED NOT DETECTED   Stenotrophomonas maltophilia NOT DETECTED NOT DETECTED   Candida albicans NOT DETECTED NOT DETECTED   Candida auris NOT DETECTED NOT DETECTED   Candida glabrata NOT DETECTED NOT DETECTED   Candida krusei NOT DETECTED NOT DETECTED   Candida parapsilosis NOT DETECTED NOT DETECTED   Candida tropicalis NOT DETECTED NOT DETECTED   Cryptococcus neoformans/gattii NOT DETECTED NOT DETECTED   Methicillin resistance mecA/C DETECTED (A) NOT DETECTED    [name] 12/23/2020  1:13 PM

## 2020-12-23 NOTE — Plan of Care (Signed)

## 2020-12-23 NOTE — Progress Notes (Addendum)
PROGRESS NOTE                                                                                                                                                                                                             Patient Demographics:    Hector Mahoney, is a 50 y.o. male, DOB - 1971-05-02, NWG:[id number]  Outpatient Primary MD for the patient is Biagio Borg, MD   Admit date - 12/22/2020   LOS - 1  Chief Complaint  Patient presents with  . Shortness of Breath       Brief Narrative: Patient is a 50 y.o. male with PMHx of ESRD on home HD 4 times/week, DM-1, HTN, HLD, chronic diastolic heart failure-who recently was exposed to COVID-19 infected family member-he subsequently started developing fever, cough, orthopnea, myalgias-his home COVID test was apparently positive on 5/7-he subsequently presented to the hospital on 5/12-UA was found to have COVID-19 pneumonia, hyperkalemia-and was subsequently admitted to the hospitalist service.  COVID-19 vaccinated status: Vaccinated including booster.  Significant Events: 5/12>> Admit to [facility] for hyperkalemia, COVID-19 pneumonia  Significant studies: 5/12>>Chest x-ray: Bilateral patchy opacities  COVID-19 medications: Steroids: 5/12>> Remdesivir: 5/12>>  Antibiotics: Vancomycin: 5/12>>  Microbiology data: 5/12 >>blood culture: 1/2-gram-positive cocci in clusters  Procedures: None  Consults: Renal  DVT prophylaxis: heparin injection 5,000 Units Start: 12/22/20 1400    Subjective:    Hector Mahoney today feels much better-he is now on room air.   Assessment  & Plan :   Acute hypoxic respiratory failure due to COVID-19 pneumonia: Clinically improved-titrated to room air-apparently had mild hypoxia when he first presented and was on 2 L of oxygen.  Continue Remdesivir/steroids-we will start tapering steroids over the next few days.  Watch  closely.   Recent Labs    12/22/20 1357 12/23/20 0235  DDIMER 2.42* 2.03*  FERRITIN 1,170* 1,510*  LDH 186  --   CRP 36.6* 38.3*    Lab Results  Component Value Date   SARSCOV2NAA POSITIVE (A) 12/22/2020   SARSCOV2NAA Not Detected 08/17/2020   [place] NEGATIVE 06/02/2019    Hyperkalemia: Missed home HD x1-urgently dialyzed on 5/12 when he first presented to the hospital-nephrology following with plans for repeat HD today as potassium still remains on the higher side.  We will start Lokelma in the interim.  Gram-positive cocci bacteremia: 1/2 blood  cultures preliminary positive for gram-positive cocci-awaiting BCID-given the fact that he is a dialysis patient-we will start treatment with vancomycin.  Repeat blood cultures later today.  Depending on the identification of the bacteria-we can contemplate further work-up.  Chronic diastolic heart failure: Volume status stable-we will manage with HD.    HTN: Continue Norvasc  HLD: Continue Lipitor  Peripheral neuropathy: Likely related to DM-continue Neurontin  History of myoclonic jerks: Continue Keppra  DM-2 (A1c 7.3 on 5/12): Continue SSI for now-at risk for steroid-induced hyperglycemia-May require escalation in his insulin regimen.  Recent Labs    12/22/20 1715 12/22/20 2054 12/23/20 0757  GLUCAP 116* 130* 313*    Obesity: Estimated body mass index is 34.87 kg/m as calculated from the following:   Height as of 11/01/20: 5\' 6"  (1.676 m).   Weight as of this encounter: 98 kg.      ABG:    Component Value Date/Time   HCO3 31.1 (H) 12/22/2020 0822   TCO2 33 (H) 12/22/2020 0822   O2SAT 98.0 12/22/2020 0822    Vent Settings: N/A    Condition - Stable  Family Communication  :  Spouse-[phone] updated over the phone 5/13  Code Status :  Full Code  Diet :  Diet Order            Diet renal/carb modified with fluid restriction Diet-HS Snack? Nothing; Fluid restriction: 1500 mL Fluid; Room  service appropriate? Yes; Fluid consistency: Thin  Diet effective now                  Disposition Plan  :   Status is: Inpatient  Remains inpatient appropriate because:Inpatient level of care appropriate due to severity of illness   Dispo: The patient is from: Home              Anticipated d/c is to: Home              Patient currently is medically stable to d/c.   Difficult to place patient No    Barriers to discharge: Hypoxia requiring O2 supplementation/complete 5 days of IV Remdesivir  Antimicorbials  :    Anti-infectives (From admission, onward)   Start     Dose/Rate Route Frequency Ordered Stop   12/23/20 1000  remdesivir 100 mg in sodium chloride 0.9 % 100 mL IVPB       "Followed by" Linked Group Details   100 mg 200 mL/hr over 30 Minutes Intravenous Daily 12/22/20 1200 12/27/20 0959   12/22/20 1300  remdesivir 200 mg in sodium chloride 0.9% 250 mL IVPB       "Followed by" Linked Group Details   200 mg 580 mL/hr over 30 Minutes Intravenous Once 12/22/20 1200 12/22/20 2127      Inpatient Medications  Scheduled Meds: . amLODipine  5 mg Oral QHS  . vitamin C  500 mg Oral Daily  . aspirin  81 mg Oral Daily  . atorvastatin  80 mg Oral Daily  . calcitRIOL  0.25 mcg Oral Q M,W,F-1800  . calcium acetate  2,001 mg Oral TID WC  . Chlorhexidine Gluconate Cloth  6 each Topical Q0600  . Chlorhexidine Gluconate Cloth  6 each Topical Q0600  . cinacalcet  60 mg Oral QPM  . docusate sodium  100 mg Oral BID  . glycopyrrolate  2 mg Oral BID  . heparin  5,000 Units Subcutaneous Q8H  . insulin aspart  0-6 Units Subcutaneous TID WC  . insulin glargine  25 Units Subcutaneous QHS  .  levETIRAcetam  250 mg Oral QHS  . loratadine  10 mg Oral Daily  . methylPREDNISolone (SOLU-MEDROL) injection  0.5 mg/kg Intravenous Q12H   Followed by  . [START ON 12/25/2020] predniSONE  50 mg Oral Q breakfast  . multivitamin  1 tablet Oral Daily  . pantoprazole  40 mg Oral Daily  . rOPINIRole   1 mg Oral QHS  . sodium chloride flush  3 mL Intravenous Q12H  . sodium chloride flush  3 mL Intravenous Q12H  . zinc sulfate  220 mg Oral Daily   Continuous Infusions: . sodium chloride    . sodium chloride    . sodium chloride    . remdesivir 100 mg in NS 100 mL 100 mg (12/23/20 0933)   PRN Meds:.sodium chloride, sodium chloride, sodium chloride, acetaminophen **OR** acetaminophen, albuterol, alteplase, bisacodyl, calcium carbonate (dosed in mg elemental calcium), camphor-menthol **AND** hydrOXYzine, chlorpheniramine-HYDROcodone, docusate sodium, feeding supplement (NEPRO CARB STEADY), gabapentin, guaiFENesin-dextromethorphan, heparin, heparin, lidocaine (PF), lidocaine-prilocaine, ondansetron **OR** ondansetron (ZOFRAN) IV, oxyCODONE, pentafluoroprop-tetrafluoroeth, polyethylene glycol, sodium chloride flush, sorbitol, zolpidem   Time Spent in minutes  35   See all Orders from today for further details   Oren Binet M.D on 12/23/2020 at 11:33 AM  To page go to [email or web address] - use universal password  [facility] -  Office  [phone]    Objective:   Vitals:   12/23/20 0200 12/23/20 0403 12/23/20 0758 12/23/20 1027  BP: (!) 145/92 (!) 147/97 (!) 139/93   Pulse: (!) 106 100 100   Resp: 19 18 16    Temp: 99 F (37.2 C) 98.2 F (36.8 C) 98.7 F (37.1 C)   TempSrc: Axillary Oral Oral   SpO2: 92% 99% 97% 92%  Weight:        Wt Readings from Last 3 Encounters:  12/22/20 98 kg  11/01/20 98 kg  06/13/20 98.4 kg     Intake/Output Summary (Last 24 hours) at 12/23/2020 1133 Last data filed at 12/23/2020 0400 Gross per 24 hour  Intake 410 ml  Output 3000 ml  Net -2590 ml     Physical Exam Gen Exam:Alert awake-not in any distress HEENT:atraumatic, normocephalic Chest: B/L clear to auscultation anteriorly CVS:S1S2 regular Abdomen:soft non tender, non distended Extremities:no edema Neurology: Non focal Skin: no rash   Data Review:    CBC Recent Labs   Lab 12/22/20 0734 12/22/20 0822 12/23/20 0235  WBC 11.9*  --  9.7  HGB 10.4* 11.2* 9.8*  HCT 32.3* 33.0* 30.4*  PLT 250  --  262  MCV 84.8  --  83.5  MCH 27.3  --  26.9  MCHC 32.2  --  32.2  RDW 14.6  --  14.6  LYMPHSABS 1.1  --  0.9  MONOABS 0.8  --  0.2  EOSABS 0.1  --  0.0  BASOSABS 0.0  --  0.0    Chemistries  Recent Labs  Lab 12/22/20 0734 12/22/20 0822 12/22/20 1905 12/23/20 0235  NA 133* 133*  --  133*  K 7.1* 7.0* 4.1 5.8*  CL 92*  --   --  92*  CO2 27  --   --  27  GLUCOSE 185*  --   --  239*  BUN 78*  --   --  44*  CREATININE 16.35*  --   --  10.91*  CALCIUM 8.3*  --   --  8.5*  MG 2.0  --   --  2.0  AST  --   --   --  18  ALT  --   --   --  21  ALKPHOS  --   --   --  72  BILITOT  --   --   --  0.7   ------------------------------------------------------------------------------------------------------------------ No results for input(s): CHOL, HDL, LDLCALC, TRIG, CHOLHDL, LDLDIRECT in the last 72 hours.  Lab Results  Component Value Date   HGBA1C 7.3 (H) 12/22/2020   ------------------------------------------------------------------------------------------------------------------ No results for input(s): TSH, T4TOTAL, T3FREE, THYROIDAB in the last 72 hours.  Invalid input(s): FREET3 ------------------------------------------------------------------------------------------------------------------ Recent Labs    12/22/20 1357 12/23/20 0235  FERRITIN 1,170* 1,510*    Coagulation profile No results for input(s): INR, PROTIME in the last 168 hours.  Recent Labs    12/22/20 1357 12/23/20 0235  DDIMER 2.42* 2.03*    Cardiac Enzymes No results for input(s): CKMB, TROPONINI, MYOGLOBIN in the last 168 hours.  Invalid input(s): CK ------------------------------------------------------------------------------------------------------------------    Component Value Date/Time   BNP 369.6 (H) 09/27/2017 1025    Micro Results Recent Results  (from the past 240 hour(s))  Blood culture (routine x 2)     Status: None (Preliminary result)   Collection Time: 12/22/20  1:57 PM   Specimen: BLOOD RIGHT ARM  Result Value Ref Range Status   Specimen Description BLOOD RIGHT ARM  Final   Special Requests   Final    BOTTLES DRAWN AEROBIC AND ANAEROBIC Blood Culture adequate volume   Culture  Setup Time   Final    GRAM POSITIVE COCCI IN CLUSTERS ANAEROBIC BOTTLE ONLY Organism ID to follow Performed at [facility], [place] [address] [place], [place] [address]    Culture GRAM POSITIVE COCCI  Final   Report Status PENDING  Incomplete  SARS CORONAVIRUS 2 (TAT 6-24 HRS) Nasopharyngeal Nasopharyngeal Swab     Status: Abnormal   Collection Time: 12/22/20  3:50 PM   Specimen: Nasopharyngeal Swab  Result Value Ref Range Status   SARS Coronavirus 2 POSITIVE (A) NEGATIVE Final    Comment: (NOTE) SARS-CoV-2 target nucleic acids are DETECTED.  The SARS-CoV-2 RNA is generally detectable in upper and lower respiratory specimens during the acute phase of infection. Positive results are indicative of the presence of SARS-CoV-2 RNA. Clinical correlation with patient history and other diagnostic information is  necessary to determine patient infection status. Positive results do not rule out bacterial infection or co-infection with other viruses.  The expected result is Negative.  Fact Sheet for Patients: SugarRoll.be  Fact Sheet for Healthcare Providers: https://www.woods-mathews.com/  This test is not yet approved or cleared by the [place] FDA and  has been authorized for detection and/or diagnosis of SARS-CoV-2 by FDA under an Emergency Use Authorization (EUA). This EUA will remain  in effect (meaning this test can be used) for the duration of the COVID-19 declaration under Section 564(b)(1) of the Act, 21 U. S.C. section 360bbb-3(b)(1), unless the authorization is terminated or revoked  sooner.   Performed at [facility], [place] [address] [place], [place] [address]     Radiology Reports DG Chest 1 View  Result Date: 12/22/2020 CLINICAL DATA:  Shortness of breath EXAM: CHEST  1 VIEW COMPARISON:  10/20/2017 FINDINGS: Heart size within normal limits. No pulmonary vascular congestion. There is patchy bilateral lung opacities. IMPRESSION: Patchy bilateral lung opacities may be due to multifocal pneumonia or pulmonary edema/fluid volume overload. Electronically Signed   By: Miachel Roux M.D.   On: 12/22/2020 08:45

## 2020-12-23 NOTE — Progress Notes (Signed)
ANTICOAGULATION CONSULT NOTE - Initial Consult  Pharmacy Consult for Vancomycin Indication: Bacteremia  No Known Allergies  Patient Measurements: Weight: 98 kg (216 lb 0.8 oz) (from March 2022)  Vital Signs: Temp: 98.7 F (37.1 C) (05/13 1238) Temp Source: Oral (05/13 1238) BP: 147/97 (05/13 1238) Pulse Rate: 103 (05/13 1238)  Labs: Recent Labs    12/22/20 0734 12/22/20 0822 12/23/20 0235  HGB 10.4* 11.2* 9.8*  HCT 32.3* 33.0* 30.4*  PLT 250  --  262  CREATININE 16.35*  --  10.91*   Estimated Creatinine Clearance: 9.1 mL/min (A) (by C-G formula based on SCr of 10.91 mg/dL (H)).  Medical History: Past Medical History:  Diagnosis Date  . Anemia   . CHF (congestive heart failure) ([place])   . DIABETES MELLITUS, TYPE I 07/30/2007  . Esophageal reflux 04/27/2009  . ESRD (end stage renal disease) on dialysis ([place])    home HD 4x/week  . HYPERLIPIDEMIA 04/24/2007  . HYPERTENSION 04/24/2007   Medications:  Anti-infectives (From admission, onward)   Start     Dose/Rate Route Frequency Ordered Stop   12/23/20 1800  vancomycin (VANCOREADY) IVPB 2000 mg/400 mL        2,000 mg 200 mL/hr over 120 Minutes Intravenous  Once 12/23/20 1330     12/23/20 1329  vancomycin variable dose per unstable renal function (pharmacist dosing)         Does not apply See admin instructions 12/23/20 1330     12/23/20 1000  remdesivir 100 mg in sodium chloride 0.9 % 100 mL IVPB       "Followed by" Linked Group Details   100 mg 200 mL/hr over 30 Minutes Intravenous Daily 12/22/20 1200 12/27/20 0959   12/22/20 1300  remdesivir 200 mg in sodium chloride 0.9% 250 mL IVPB       "Followed by" Linked Group Details   200 mg 580 mL/hr over 30 Minutes Intravenous Once 12/22/20 1200 12/22/20 2127     Assessment: 44 yom on ESRD w/ COVID PNA and possible gram-positive  bacteremia. Nephrology following. Emergent HD on 5/12 next session scheduled for this afternoon (5/13). Pt dialyzes 4d weekly prior to  admission; inpatient schedule to be determined by nephrology.  Bcx (5/12) 1/2 S. Epi mecA +  Goal of Therapy:  Pre-HD level 15-25 mcg/ml   Plan:  Vancomycin 2000mg  LD dose ordered for post-HD on 5/13 @ 1800. Order post-HD dosing as indicated per protocol Monitor culture sensitivities and clinical course for opportunities to streamline antitbiotics  Burnett Corrente 12/23/2020,1:31 PM

## 2020-12-23 NOTE — Progress Notes (Addendum)
[facility] Progress Note  Subjective: Patient not seen directly today given COVID-19 + status, utilizing data taken from chart +/- discussions w/ providers and staff.   3 L UF yest on HD, with one BP drop to the 80's. BP's are fine today, HR 100- 110, RR 18 -20.    Vitals:   12/23/20 1418 12/23/20 1430 12/23/20 1500 12/23/20 1530  BP: (!) 164/112 (!) 132/94 (!) 153/106 (!) 152/108  Pulse: (!) 104 100 (!) 106 (!) 112  Resp:      Temp:      TempSrc:      SpO2:      Weight:        Exam:  Patient not seen directly today given COVID-19 + status, utilizing data taken from chart +/- discussions w/ providers and staff.      OP HD: home HD >> M-Tu-Th-Fri. LUE AVF, edw 94.5kg  - sensipar 60mg  po qhs  - calcitriol 0.25 ug mwf  - phoslo 3 ac tid  5/12 CXR -  IMPRESSION: Patchy bilateral lung opacities may be due to multifocal pneumonia or pulmonary edema/fluid volume overload.   Assessment/ Plan: 1. COVID-19 PNA - getting O2, steroids and remdesivir 2. Hyperkalemia - resolved 3. ESRD - home HD patient on HD 4x /wk as above. HD yest here and getting HD again today.  4. HTN/ volume - lowering dry wt, may have lost body wt. UF w/ HD again today. Gets norvasc 5/d at home only, same here. 5. Anemia ckd - Hb at goal, no need for esa 6. MBD ckd - cont sensipar, calcitriol and binder as at home   [employer] 12/23/2020, 3:37 PM   Recent Labs  Lab 12/22/20 0734 12/22/20 0822 12/22/20 1905 12/23/20 0235  K 7.1* 7.0* 4.1 5.8*  BUN 78*  --   --  44*  CREATININE 16.35*  --   --  10.91*  CALCIUM 8.3*  --   --  8.5*  PHOS  --   --   --  5.0*  HGB 10.4* 11.2*  --  9.8*   Inpatient medications: . amLODipine  5 mg Oral QHS  . vitamin C  500 mg Oral Daily  . aspirin  81 mg Oral Daily  . atorvastatin  80 mg Oral Daily  . calcitRIOL  0.25 mcg Oral Q M,W,F-1800  . calcium acetate  2,001 mg Oral TID WC  . Chlorhexidine Gluconate Cloth  6 each Topical Q0600  .  Chlorhexidine Gluconate Cloth  6 each Topical Q0600  . cinacalcet  60 mg Oral QPM  . docusate sodium  100 mg Oral BID  . glycopyrrolate  2 mg Oral BID  . heparin  5,000 Units Subcutaneous Q8H  . insulin aspart  0-6 Units Subcutaneous TID WC  . insulin glargine  25 Units Subcutaneous QHS  . levETIRAcetam  250 mg Oral QHS  . loratadine  10 mg Oral Daily  . methylPREDNISolone (SOLU-MEDROL) injection  0.5 mg/kg Intravenous Q12H   Followed by  . [START ON 12/25/2020] predniSONE  50 mg Oral Q breakfast  . multivitamin  1 tablet Oral Daily  . pantoprazole  40 mg Oral Daily  . rOPINIRole  1 mg Oral QHS  . sodium chloride flush  3 mL Intravenous Q12H  . sodium chloride flush  3 mL Intravenous Q12H  . sodium zirconium cyclosilicate  10 g Oral BID  . vancomycin variable dose per unstable renal function (pharmacist dosing)   Does not apply See admin instructions  .  zinc sulfate  220 mg Oral Daily   . sodium chloride    . sodium chloride    . sodium chloride    . remdesivir 100 mg in NS 100 mL 100 mg (12/23/20 0933)  . vancomycin     sodium chloride, sodium chloride, sodium chloride, acetaminophen **OR** acetaminophen, albuterol, alteplase, bisacodyl, calcium carbonate (dosed in mg elemental calcium), camphor-menthol **AND** hydrOXYzine, chlorpheniramine-HYDROcodone, docusate sodium, feeding supplement (NEPRO CARB STEADY), gabapentin, guaiFENesin-dextromethorphan, heparin, heparin, lidocaine (PF), lidocaine-prilocaine, ondansetron **OR** ondansetron (ZOFRAN) IV, oxyCODONE, pentafluoroprop-tetrafluoroeth, polyethylene glycol, sodium chloride flush, sorbitol, zolpidem

## 2020-12-23 NOTE — TOC Initial Note (Addendum)
Transition of Care [facility]) - Initial/Assessment Note    Patient Details  Name: Hector Mahoney MRN: [id number] Date of Birth: [birth year]  [facility] of [facility] CM/SW Contact:    Verdell Carmine, RN Phone Number: 12/23/2020, 10:16 AM  Clinical Narrative:                 $* yo veteran admitted wit [place] positive COVID. He is a ESRD patient and does home dialysis.  He is awaiting transplant. Terrilee Croak at [facility] was notified that the patient was admitted. This RNCM number was given in case of any questions. Patient will be followed by CM for any needs.   [place] called back with Jule Ser, the patient goes to [facility] for services . I called [phone]  Spoke to [facility] authorization number for admission [place] [id number]  Expected Discharge Plan: Home/Self Care Barriers to Discharge: Continued Medical Work up   Patient Goals and CMS Choice        Expected Discharge Plan and Services Expected Discharge Plan: Home/Self Care       Living arrangements for the past 2 months: Single Family Home                                      Prior Living Arrangements/Services Living arrangements for the past 2 months: Single Family Home Lives with:: Self Patient language and need for interpreter reviewed:: Yes        Need for Family Participation in Patient Care: Yes (Comment) Care giver support system in place?: Yes (comment)   Criminal Activity/Legal Involvement Pertinent to Current Situation/Hospitalization: No - Comment as needed  Activities of Daily Living Home Assistive Devices/Equipment: None ADL Screening (condition at time of admission) Patient's cognitive ability adequate to safely complete daily activities?: Yes Is the patient deaf or have difficulty hearing?: No Does the patient have difficulty seeing, even when wearing glasses/contacts?: No Does the patient have difficulty concentrating, remembering, or making decisions?: No Patient able  to express need for assistance with ADLs?: Yes Does the patient have difficulty dressing or bathing?: No Independently performs ADLs?: Yes (appropriate for developmental age) Does the patient have difficulty walking or climbing stairs?: No Weakness of Legs: None Weakness of Arms/Hands: None  Permission Sought/Granted                  Emotional Assessment       Orientation: : Oriented to Self,Oriented to Place,Oriented to  Time,Oriented to Situation Alcohol / Substance Use: Not Applicable Psych Involvement: No (comment)  Admission diagnosis:  COVID-19 [U07.1] Patient Active Problem List   Diagnosis Date Noted  . COVID-19 12/22/2020  . RLS (restless legs syndrome) 08/11/2019  . Iron deficiency anemia 06/25/2019  . Allergic rhinitis 05/31/2019  . Acute dysfunction of right eustachian tube 05/31/2019  . Vertigo 05/31/2019  . ESRD on hemodialysis ([place]) 05/31/2019  . Abdominal pain 03/26/2018  . Nausea 03/26/2018  . Diarrhea 03/26/2018  . Acute renal failure superimposed on stage 5 chronic kidney disease, not on chronic dialysis ([place]) 10/20/2017  . Hyperkalemia 10/20/2017  . 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 Low back pain 03/29/2017  . Nocturnal leg cramps 04/03/2016  . Hypertensive urgency 09/02/2015  . Acute on chronic diastolic heart failure ([place]) 09/02/2015  . Patient nonadherence 09/02/2015  . Dyspnea 12/22/2012  . Insulin dependent diabetes mellitus with complications 06/00/4599  .  Preventative health care 04/01/2011  . Esophageal reflux 04/27/2009  . INSOMNIA-SLEEP DISORDER-UNSPEC 04/27/2009  . Type 1 diabetes mellitus ([place]) 07/30/2007  . Dyslipidemia 04/24/2007  . Essential hypertension 04/24/2007   PCP:  Biagio Borg, MD Pharmacy:   [facility] ([place]), [place] - [address] [place] ([place]) [place] [address] Phone: [phone] Fax: [place], [address] AT [facility] [address] [place] [address] [place] [address] Phone: [phone] Fax: [phone]     Social Determinants of Health (SDOH) Interventions    Readmission Risk Interventions No flowsheet data found.

## 2020-12-23 NOTE — Progress Notes (Signed)
OT Cancellation Note and Discharge  Patient Details Name: Hector Mahoney MRN: [id number] DOB: [birth year]   Cancelled Treatment:     Noted pt was independent with PT earlier today, chat texted with PT and they do not feel pt has any OT needs (on RA, not SOB, moving independently)--OT will not eval.  [place], OTR/L Acute Rehab Services Pager [phone] Office [phone]     Almon Register 12/23/2020, 12:54 PM

## 2020-12-23 NOTE — Progress Notes (Signed)
Inpatient Diabetes Program Recommendations  AACE/ADA: New Consensus Statement on Inpatient Glycemic Control (2015)  Target Ranges:  Prepandial:   less than 140 mg/dL      Peak postprandial:   less than 180 mg/dL (1-2 hours)      Critically ill patients:  140 - 180 mg/dL   Lab Results  Component Value Date   GLUCAP 324 (H) 12/23/2020   HGBA1C 7.3 (H) 12/22/2020    Review of Glycemic Control  Diabetes history: DM1 Outpatient Diabetes medications: Lantus 25 units QHS, Novolog 8-12 units TID Current orders for Inpatient glycemic control: Lantus 25 units QHS, Novolog 0-6 units TID with meals. On Prednisone 50 mg QAM  On transplant lists at [facility] and [facility].  HgbA1C - 7.3% CBGs 313, 324 mg/dL  Inpatient Diabetes Program Recommendations:     Add Novolog 6 units TID with meals if eating > 50% meals. Titrate daily.  Add Novolog HS correction while on steroids.  Will continue to monitor.  Thank you. Lorenda Peck, RD, LDN, CDE Inpatient Diabetes Coordinator [phone]

## 2020-12-23 NOTE — Evaluation (Signed)
Physical Therapy Evaluation Patient Details Name: Hector Mahoney MRN: [id number] DOB: 1970/12/16 Today's Date: 12/23/2020   History of Present Illness  Hector Mahoney is a 50 y.o. male with medical history significant of HTN; HLD; DM (type 1); ESRD on 4x/week home HD; and chronic diastolic CHF presenting with SOB, known COVID-19 infection. He is on transplant lists at the [facility] and [place]  Patient received in bed, he is agreeable to PT assessment. Reports he has not been oob.  Patient is independent with all basic mobility at this time. No assist needed for mobility in room other than line management. He does not demonstrate a need for continued skilled PT. Will sign off at this time.       Follow Up Recommendations No PT follow up    Equipment Recommendations  None recommended by PT    Recommendations for Other Services       Precautions / Restrictions Precautions Precautions: None Precaution Comments: low fall Restrictions Weight Bearing Restrictions: No      Mobility  Bed Mobility Overal bed mobility: Independent                  Transfers Overall transfer level: Independent                  Ambulation/Gait Ambulation/Gait assistance: Independent Gait Distance (Feet): 30 Feet Assistive device: None Gait Pattern/deviations: WFL(Within Functional Limits)     General Gait Details: safe with mobility, no AD, so significant SOB or difficulties reported or noted  Stairs            Wheelchair Mobility    Modified Rankin (Stroke Patients Only)       Balance Overall balance assessment: Independent                                           Pertinent Vitals/Pain Pain Assessment: No/denies pain    Home Living Family/patient expects to be discharged to:: Private residence Living Arrangements: Spouse/significant other Available Help at Discharge: Family;Available PRN/intermittently            Home Equipment: None      Prior Function Level of Independence: Independent         Comments: patient works for [employer]     Hand Dominance        Extremity/Trunk Assessment   Upper Extremity Assessment Upper Extremity Assessment: Overall WFL for tasks assessed    Lower Extremity Assessment Lower Extremity Assessment: Overall WFL for tasks assessed    Cervical / Trunk Assessment Cervical / Trunk Assessment: Normal  Communication   Communication: No difficulties  Cognition Arousal/Alertness: Awake/alert Behavior During Therapy: WFL for tasks assessed/performed Overall Cognitive Status: Within Functional Limits for tasks assessed                                        General Comments      Exercises     Assessment/Plan    PT Assessment Patent does not need any further PT services  PT Problem List         PT Treatment Interventions      PT Goals (Current goals can be found in the Care Plan section)  Acute Rehab PT Goals Patient Stated Goal: to return home PT Goal Formulation:  With patient Time For Goal Achievement: 12/26/20 Potential to Achieve Goals: Good    Frequency     Barriers to discharge        Co-evaluation               AM-PAC PT "6 Clicks" Mobility  Outcome Measure Help needed turning from your back to your side while in a flat bed without using bedrails?: None Help needed moving from lying on your back to sitting on the side of a flat bed without using bedrails?: None Help needed moving to and from a bed to a chair (including a wheelchair)?: None Help needed standing up from a chair using your arms (e.g., wheelchair or bedside chair)?: None Help needed to walk in hospital room?: None Help needed climbing 3-5 steps with a railing? : None 6 Click Score: 24    End of Session   Activity Tolerance: Patient tolerated treatment well Patient left: in chair;with call bell/phone within reach Nurse Communication:  Mobility status      Time: 6226-3335 PT Time Calculation (min) (ACUTE ONLY): 17 min   Charges:   PT Evaluation $PT Eval Low Complexity: 1 Low          Yasuko Lapage, PT, GCS 12/23/20,10:31 AM

## 2020-12-24 DIAGNOSIS — J1282 Pneumonia due to coronavirus disease 2019: Secondary | ICD-10-CM

## 2020-12-24 LAB — COMPREHENSIVE METABOLIC PANEL
ALT: 21 U/L (ref 0–44)
AST: 16 U/L (ref 15–41)
Albumin: 3 g/dL — ABNORMAL LOW (ref 3.5–5.0)
Alkaline Phosphatase: 69 U/L (ref 38–126)
Anion gap: 16 — ABNORMAL HIGH (ref 5–15)
BUN: 42 mg/dL — ABNORMAL HIGH (ref 6–20)
CO2: 25 mmol/L (ref 22–32)
Calcium: 8.9 mg/dL (ref 8.9–10.3)
Chloride: 93 mmol/L — ABNORMAL LOW (ref 98–111)
Creatinine, Ser: 8.63 mg/dL — ABNORMAL HIGH (ref 0.61–1.24)
GFR, Estimated: 7 mL/min — ABNORMAL LOW (ref >60)
Glucose, Bld: 307 mg/dL — ABNORMAL HIGH (ref 70–99)
Potassium: 4.5 mmol/L (ref 3.5–5.1)
Sodium: 134 mmol/L — ABNORMAL LOW (ref 135–145)
Total Bilirubin: 0.7 mg/dL (ref 0.3–1.2)
Total Protein: 7.9 g/dL (ref 6.5–8.1)

## 2020-12-24 LAB — CBC WITH DIFFERENTIAL/PLATELET
Abs Immature Granulocytes: 0.19 10*3/uL — ABNORMAL HIGH (ref 0.00–0.07)
Basophils Absolute: 0 10*3/uL (ref 0.0–0.1)
Basophils Relative: 0 %
Eosinophils Absolute: 0 10*3/uL (ref 0.0–0.5)
Eosinophils Relative: 0 %
HCT: 30 % — ABNORMAL LOW (ref 39.0–52.0)
Hemoglobin: 9.7 g/dL — ABNORMAL LOW (ref 13.0–17.0)
Immature Granulocytes: 1 %
Lymphocytes Relative: 6 %
Lymphs Abs: 0.9 10*3/uL (ref 0.7–4.0)
MCH: 26.9 pg (ref 26.0–34.0)
MCHC: 32.3 g/dL (ref 30.0–36.0)
MCV: 83.1 fL (ref 80.0–100.0)
Monocytes Absolute: 0.8 10*3/uL (ref 0.1–1.0)
Monocytes Relative: 5 %
Neutro Abs: 13.3 10*3/uL — ABNORMAL HIGH (ref 1.7–7.7)
Neutrophils Relative %: 88 %
Platelets: 341 10*3/uL (ref 150–400)
RBC: 3.61 MIL/uL — ABNORMAL LOW (ref 4.22–5.81)
RDW: 14.4 % (ref 11.5–15.5)
WBC: 15.2 10*3/uL — ABNORMAL HIGH (ref 4.0–10.5)
nRBC: 0 % (ref 0.0–0.2)

## 2020-12-24 LAB — MAGNESIUM: Magnesium: 2 mg/dL (ref 1.7–2.4)

## 2020-12-24 LAB — GLUCOSE, CAPILLARY
Glucose-Capillary: 319 mg/dL — ABNORMAL HIGH (ref 70–99)
Glucose-Capillary: 365 mg/dL — ABNORMAL HIGH (ref 70–99)
Glucose-Capillary: 408 mg/dL — ABNORMAL HIGH (ref 70–99)
Glucose-Capillary: 387 mg/dL — ABNORMAL HIGH (ref 70–99)

## 2020-12-24 LAB — PHOSPHORUS: Phosphorus: 5.1 mg/dL — ABNORMAL HIGH (ref 2.5–4.6)

## 2020-12-24 LAB — C-REACTIVE PROTEIN: CRP: 28.4 mg/dL — ABNORMAL HIGH (ref <1.0)

## 2020-12-24 LAB — D-DIMER, QUANTITATIVE: D-Dimer, Quant: 0.87 ug/mL-FEU — ABNORMAL HIGH (ref 0.00–0.50)

## 2020-12-24 LAB — FERRITIN: Ferritin: 1490 ng/mL — ABNORMAL HIGH (ref 24–336)

## 2020-12-24 MED ORDER — INSULIN GLARGINE 100 UNIT/ML ~~LOC~~ SOLN
32.0000 [IU] | Freq: Every day | SUBCUTANEOUS | Status: DC
  Administered 2020-12-24: 32 [IU] via SUBCUTANEOUS
  Filled 2020-12-24 (×2): qty 0.32

## 2020-12-24 MED ORDER — METHYLPREDNISOLONE SODIUM SUCC 40 MG IJ SOLR
40.0000 mg | Freq: Two times a day (BID) | INTRAMUSCULAR | Status: DC
  Administered 2020-12-24: 40 mg via INTRAVENOUS
  Filled 2020-12-24: qty 1

## 2020-12-24 MED ORDER — INSULIN ASPART 100 UNIT/ML IJ SOLN
4.0000 [IU] | Freq: Three times a day (TID) | INTRAMUSCULAR | Status: DC
  Administered 2020-12-24 – 2020-12-25 (×3): 4 [IU] via SUBCUTANEOUS

## 2020-12-24 MED ORDER — HYDRALAZINE HCL 20 MG/ML IJ SOLN: 5.0000 mg | Freq: Three times a day (TID) | INTRAMUSCULAR | Status: DC | PRN

## 2020-12-24 MED ORDER — AMLODIPINE BESYLATE 10 MG PO TABS: 10.0000 mg | ORAL_TABLET | Freq: Every day | ORAL | Status: DC

## 2020-12-24 MED ORDER — AMLODIPINE BESYLATE 5 MG PO TABS
5.0000 mg | ORAL_TABLET | Freq: Once | ORAL | Status: AC
  Administered 2020-12-24: 5 mg via ORAL
  Filled 2020-12-24: qty 1

## 2020-12-24 MED ORDER — INSULIN ASPART 100 UNIT/ML IJ SOLN
0.0000 [IU] | Freq: Every day | INTRAMUSCULAR | Status: DC
  Administered 2020-12-24: 5 [IU] via SUBCUTANEOUS

## 2020-12-24 NOTE — Progress Notes (Signed)
PROGRESS NOTE                                                                                                                                                                                                             Patient Demographics:    Hector Mahoney, is a 50 y.o. male, DOB - 1970-10-09, FIE:[id number]  Outpatient Primary MD for the patient is Biagio Borg, MD   Admit date - 12/22/2020   LOS - 2  Chief Complaint  Patient presents with  . Shortness of Breath       Brief Narrative: Patient is a 50 y.o. male with PMHx of ESRD on home HD 4 times/week, DM-1, HTN, HLD, chronic diastolic heart failure-who recently was exposed to COVID-19 infected family member-he subsequently started developing fever, cough, orthopnea, myalgias-his home COVID test was apparently positive on 5/7-he subsequently presented to the hospital on 5/12-UA was found to have COVID-19 pneumonia, hyperkalemia-and was subsequently admitted to the hospitalist service.  COVID-19 vaccinated status: Vaccinated including booster.  Significant Events: 5/12>> Admit to [facility] for hyperkalemia, COVID-19 pneumonia  Significant studies: 5/12>>Chest x-ray: Bilateral patchy opacities  COVID-19 medications: Steroids: 5/12>> Remdesivir: 5/12>>  Antibiotics: Vancomycin: 5/12>>  Microbiology data: 5/12 >>blood culture: 1/2-staph epidermidis (likely a contaminant) 5/13>> blood cultures: Pending  Procedures: None  Consults: Renal  DVT prophylaxis: heparin injection 5,000 Units Start: 12/22/20 1400    Subjective:   Continues to feel better-remains on room air.   Assessment  & Plan :   Acute hypoxic respiratory failure due to COVID-19 pneumonia: Improved-remains on room air-on steroid/Remdesivir-continue steroids but taper.     Recent Labs    12/22/20 1357 12/23/20 0235 12/24/20 0153  DDIMER 2.42* 2.03* 0.87*  FERRITIN 1,170* 1,510* 1,490*   LDH 186  --   --   CRP 36.6* 38.3* 28.4*    Lab Results  Component Value Date   SARSCOV2NAA POSITIVE (A) 12/22/2020   SARSCOV2NAA Not Detected 08/17/2020   [place] NEGATIVE 06/02/2019    Hyperkalemia: Missed home HD x1-urgently dialyzed on 5/12 when he first presented to the hospital-nephrology following with plans for repeat HD today as potassium still remains on the higher side.  We will start Lokelma in the interim.  Gram-positive cocci bacteremia: 1/2 blood cultures on 5/12 positive for staph epi-given 1 dose of IV vancomycin yesterday-patient has aVF for  HD.  Awaiting a repeat blood cultures done on 5/13.  Suspect that positive blood cultures are likely a contaminant rather than infection.  Chronic diastolic heart failure: Volume status stable-we will manage with HD.    HTN: Continue Norvasc  HLD: Continue Lipitor  Peripheral neuropathy: Likely related to DM-continue Neurontin  History of myoclonic jerks: Continue Keppra  DM-2 (A1c 7.3 on 5/12) with uncontrolled hyperglycemia due to steroids: CBGs on the higher side-increase Lantus to 32 units-add 4 units of NovoLog with meals.  Continue SSI.  Follow and adjust.    Recent Labs    12/23/20 2105 12/24/20 0831 12/24/20 1113  GLUCAP 275* 319* 365*    Obesity: Estimated body mass index is 31.56 kg/m as calculated from the following:   Height as of 11/01/20: 5\' 6"  (1.676 m).   Weight as of this encounter: 88.7 kg.      ABG:    Component Value Date/Time   HCO3 31.1 (H) 12/22/2020 0822   TCO2 33 (H) 12/22/2020 0822   O2SAT 98.0 12/22/2020 0822    Vent Settings: N/A    Condition - Stable  Family Communication  :  Spouse-Jennifer-[phone] updated over the phone 5/13  Code Status :  Full Code  Diet :  Diet Order            Diet renal/carb modified with fluid restriction Diet-HS Snack? Nothing; Fluid restriction: 1500 mL Fluid; Room service appropriate? Yes; Fluid consistency: Thin  Diet effective now                   Disposition Plan  :   Status is: Inpatient  Remains inpatient appropriate because:Inpatient level of care appropriate due to severity of illness   Dispo: The patient is from: Home              Anticipated d/c is to: Home              Patient currently is medically stable to d/c.   Difficult to place patient No    Barriers to discharge: Hypoxia requiring O2 supplementation/complete 5 days of IV Remdesivir  Antimicorbials  :    Anti-infectives (From admission, onward)   Start     Dose/Rate Route Frequency Ordered Stop   12/23/20 1800  vancomycin (VANCOREADY) IVPB 2000 mg/400 mL        2,000 mg 200 mL/hr over 120 Minutes Intravenous  Once 12/23/20 1330 12/23/20 1930   12/23/20 1329  vancomycin variable dose per unstable renal function (pharmacist dosing)         Does not apply See admin instructions 12/23/20 1330     12/23/20 1000  remdesivir 100 mg in sodium chloride 0.9 % 100 mL IVPB       "Followed by" Linked Group Details   100 mg 200 mL/hr over 30 Minutes Intravenous Daily 12/22/20 1200 12/27/20 0959   12/22/20 1300  remdesivir 200 mg in sodium chloride 0.9% 250 mL IVPB       "Followed by" Linked Group Details   200 mg 580 mL/hr over 30 Minutes Intravenous Once 12/22/20 1200 12/22/20 2127      Inpatient Medications  Scheduled Meds: . amLODipine  5 mg Oral QHS  . vitamin C  500 mg Oral Daily  . aspirin  81 mg Oral Daily  . atorvastatin  80 mg Oral Daily  . calcitRIOL  0.25 mcg Oral Q M,W,F-1800  . calcium acetate  2,001 mg Oral TID WC  . Chlorhexidine Gluconate Cloth  6  each Topical [id number]  . cinacalcet  60 mg Oral QPM  . docusate sodium  100 mg Oral BID  . glycopyrrolate  2 mg Oral BID  . heparin  5,000 Units Subcutaneous Q8H  . insulin aspart  0-6 Units Subcutaneous TID WC  . insulin glargine  25 Units Subcutaneous QHS  . levETIRAcetam  250 mg Oral QHS  . loratadine  10 mg Oral Daily  . methylPREDNISolone (SOLU-MEDROL) injection  0.5 mg/kg  Intravenous Q12H   Followed by  . [START ON 12/25/2020] predniSONE  50 mg Oral Q breakfast  . multivitamin  1 tablet Oral Daily  . pantoprazole  40 mg Oral Daily  . rOPINIRole  1 mg Oral QHS  . sodium chloride flush  3 mL Intravenous Q12H  . sodium chloride flush  3 mL Intravenous Q12H  . vancomycin variable dose per unstable renal function (pharmacist dosing)   Does not apply See admin instructions  . zinc sulfate  220 mg Oral Daily   Continuous Infusions: . sodium chloride    . sodium chloride    . sodium chloride    . remdesivir 100 mg in NS 100 mL 100 mg (12/24/20 0857)   PRN Meds:.sodium chloride, sodium chloride, sodium chloride, acetaminophen **OR** acetaminophen, albuterol, alteplase, bisacodyl, calcium carbonate (dosed in mg elemental calcium), camphor-menthol **AND** hydrOXYzine, chlorpheniramine-HYDROcodone, docusate sodium, feeding supplement (NEPRO CARB STEADY), gabapentin, guaiFENesin-dextromethorphan, heparin, heparin, lidocaine (PF), lidocaine-prilocaine, ondansetron **OR** ondansetron (ZOFRAN) IV, oxyCODONE, pentafluoroprop-tetrafluoroeth, polyethylene glycol, sodium chloride flush, sorbitol, zolpidem   Time Spent in minutes  25   See all Orders from today for further details   Oren Binet M.D on 12/24/2020 at 1:15 PM  To page go to [email or web address] - use universal password  [facility] -  Office  [phone]    Objective:   Vitals:   12/24/20 0455 12/24/20 0827 12/24/20 0930 12/24/20 1114  BP: 132/83 136/86 140/88 (!) 154/96  Pulse: (!) 106 (!) 106 100 (!) 103  Resp: 20 12 14 15   Temp: 98.2 F (36.8 C) 98.5 F (36.9 C) 98.5 F (36.9 C) 98.4 F (36.9 C)  TempSrc: Oral Oral Oral Oral  SpO2: 98% 95%  96%  Weight:        Wt Readings from Last 3 Encounters:  12/23/20 88.7 kg  11/01/20 98 kg  06/13/20 98.4 kg     Intake/Output Summary (Last 24 hours) at 12/24/2020 1315 Last data filed at 12/24/2020 0941 Gross per 24 hour  Intake 580 ml   Output 1224 ml  Net -644 ml     Physical Exam Gen Exam:Alert awake-not in any distress HEENT:atraumatic, normocephalic Chest: B/L clear to auscultation anteriorly CVS:S1S2 regular Abdomen:soft non tender, non distended Extremities:no edema Neurology: Non focal Skin: no rash   Data Review:    CBC Recent Labs  Lab 12/22/20 0734 12/22/20 0822 12/23/20 0235 12/24/20 0153  WBC 11.9*  --  9.7 15.2*  HGB 10.4* 11.2* 9.8* 9.7*  HCT 32.3* 33.0* 30.4* 30.0*  PLT 250  --  262 341  MCV 84.8  --  83.5 83.1  MCH 27.3  --  26.9 26.9  MCHC 32.2  --  32.2 32.3  RDW 14.6  --  14.6 14.4  LYMPHSABS 1.1  --  0.9 0.9  MONOABS 0.8  --  0.2 0.8  EOSABS 0.1  --  0.0 0.0  BASOSABS 0.0  --  0.0 0.0    Chemistries  Recent Labs  Lab 12/22/20 0734 12/22/20 0822 12/22/20 1905 12/23/20 0235 12/24/20  0153  NA 133* 133*  --  133* 134*  K 7.1* 7.0* 4.1 5.8* 4.5  CL 92*  --   --  92* 93*  CO2 27  --   --  27 25  GLUCOSE 185*  --   --  239* 307*  BUN 78*  --   --  44* 42*  CREATININE 16.35*  --   --  10.91* 8.63*  CALCIUM 8.3*  --   --  8.5* 8.9  MG 2.0  --   --  2.0 2.0  AST  --   --   --  18 16  ALT  --   --   --  21 21  ALKPHOS  --   --   --  72 69  BILITOT  --   --   --  0.7 0.7   ------------------------------------------------------------------------------------------------------------------ No results for input(s): CHOL, HDL, LDLCALC, TRIG, CHOLHDL, LDLDIRECT in the last 72 hours.  Lab Results  Component Value Date   HGBA1C 7.3 (H) 12/22/2020   ------------------------------------------------------------------------------------------------------------------ No results for input(s): TSH, T4TOTAL, T3FREE, THYROIDAB in the last 72 hours.  Invalid input(s): FREET3 ------------------------------------------------------------------------------------------------------------------ Recent Labs    12/23/20 0235 12/24/20 0153  FERRITIN 1,510* 1,490*    Coagulation profile No  results for input(s): INR, PROTIME in the last 168 hours.  Recent Labs    12/23/20 0235 12/24/20 0153  DDIMER 2.03* 0.87*    Cardiac Enzymes No results for input(s): CKMB, TROPONINI, MYOGLOBIN in the last 168 hours.  Invalid input(s): CK ------------------------------------------------------------------------------------------------------------------    Component Value Date/Time   BNP 369.6 (H) 09/27/2017 4315    Micro Results Recent Results (from the past 240 hour(s))  Blood culture (routine x 2)     Status: None (Preliminary result)   Collection Time: 12/22/20  8:00 AM   Specimen: BLOOD  Result Value Ref Range Status   Specimen Description BLOOD SITE NOT SPECIFIED  Final   Special Requests   Final    BOTTLES DRAWN AEROBIC AND ANAEROBIC Blood Culture adequate volume   Culture   Final    NO GROWTH 1 DAY Performed at [facility], [address] [place], [place] [address]    Report Status PENDING  Incomplete  Blood culture (routine x 2)     Status: Abnormal (Preliminary result)   Collection Time: 12/22/20  1:57 PM   Specimen: BLOOD RIGHT ARM  Result Value Ref Range Status   Specimen Description BLOOD RIGHT ARM  Final   Special Requests   Final    BOTTLES DRAWN AEROBIC AND ANAEROBIC Blood Culture adequate volume   Culture  Setup Time   Final    GRAM POSITIVE COCCI IN CLUSTERS IN BOTH AEROBIC AND ANAEROBIC BOTTLES CRITICAL RESULT CALLED TO, READ BACK BY AND VERIFIED WITH: PHARMD A.WOLF AT 1236  ON 12/23/2020 BY T.SAAD.    Culture (A)  Final    STAPHYLOCOCCUS EPIDERMIDIS THE SIGNIFICANCE OF ISOLATING THIS ORGANISM FROM A SINGLE SET OF BLOOD CULTURES WHEN MULTIPLE SETS ARE DRAWN IS UNCERTAIN. PLEASE NOTIFY THE MICROBIOLOGY DEPARTMENT WITHIN ONE WEEK IF SPECIATION AND SENSITIVITIES ARE REQUIRED. Performed at [facility], [place] [address] [place], [place] [address]    Report Status PENDING  Incomplete  Blood Culture ID Panel (Reflexed)     Status: Abnormal    Collection Time: 12/22/20  1:57 PM  Result Value Ref Range Status   Enterococcus faecalis NOT DETECTED NOT DETECTED Final   Enterococcus Faecium NOT DETECTED NOT DETECTED Final  Listeria monocytogenes NOT DETECTED NOT DETECTED Final   Staphylococcus species DETECTED (A) NOT DETECTED Final    Comment: CRITICAL RESULT CALLED TO, READ BACK BY AND VERIFIED WITH: PHARMD A.WOLF AT 1236 ON 12/23/2020 BY T.SAAD.    Staphylococcus aureus (BCID) NOT DETECTED NOT DETECTED Final   Staphylococcus epidermidis DETECTED (A) NOT DETECTED Final    Comment: Methicillin (oxacillin) resistant coagulase negative staphylococcus. Possible blood culture contaminant (unless isolated from more than one blood culture draw or clinical case suggests pathogenicity). No antibiotic treatment is indicated for blood  culture contaminants. CRITICAL RESULT CALLED TO, READ BACK BY AND VERIFIED WITH: PHARMD A.WOLF AT 1236 ON 12/23/2020 BY T.SAAD.    Staphylococcus lugdunensis NOT DETECTED NOT DETECTED Final   Streptococcus species NOT DETECTED NOT DETECTED Final   Streptococcus agalactiae NOT DETECTED NOT DETECTED Final   Streptococcus pneumoniae NOT DETECTED NOT DETECTED Final   Streptococcus pyogenes NOT DETECTED NOT DETECTED Final   A.calcoaceticus-baumannii NOT DETECTED NOT DETECTED Final   Bacteroides fragilis NOT DETECTED NOT DETECTED Final   Enterobacterales NOT DETECTED NOT DETECTED Final   Enterobacter cloacae complex NOT DETECTED NOT DETECTED Final   Escherichia coli NOT DETECTED NOT DETECTED Final   Klebsiella aerogenes NOT DETECTED NOT DETECTED Final   Klebsiella oxytoca NOT DETECTED NOT DETECTED Final   Klebsiella pneumoniae NOT DETECTED NOT DETECTED Final   Proteus species NOT DETECTED NOT DETECTED Final   Salmonella species NOT DETECTED NOT DETECTED Final   Serratia marcescens NOT DETECTED NOT DETECTED Final   Haemophilus influenzae NOT DETECTED NOT DETECTED Final   Neisseria meningitidis NOT DETECTED NOT  DETECTED Final   Pseudomonas aeruginosa NOT DETECTED NOT DETECTED Final   Stenotrophomonas maltophilia NOT DETECTED NOT DETECTED Final   Candida albicans NOT DETECTED NOT DETECTED Final   Candida auris NOT DETECTED NOT DETECTED Final   Candida glabrata NOT DETECTED NOT DETECTED Final   Candida krusei NOT DETECTED NOT DETECTED Final   Candida parapsilosis NOT DETECTED NOT DETECTED Final   Candida tropicalis NOT DETECTED NOT DETECTED Final   Cryptococcus neoformans/gattii NOT DETECTED NOT DETECTED Final   Methicillin resistance mecA/C DETECTED (A) NOT DETECTED Final    Comment: CRITICAL RESULT CALLED TO, READ BACK BY AND VERIFIED WITH: PHARMD A.WOLF AT 1236 ON 12/23/2020 BY T.SAAD. Performed at [facility], [place] [address] [place], [place] [address]   SARS CORONAVIRUS 2 (TAT 6-24 HRS) Nasopharyngeal Nasopharyngeal Swab     Status: Abnormal   Collection Time: 12/22/20  3:50 PM   Specimen: Nasopharyngeal Swab  Result Value Ref Range Status   SARS Coronavirus 2 POSITIVE (A) NEGATIVE Final    Comment: (NOTE) SARS-CoV-2 target nucleic acids are DETECTED.  The SARS-CoV-2 RNA is generally detectable in upper and lower respiratory specimens during the acute phase of infection. Positive results are indicative of the presence of SARS-CoV-2 RNA. Clinical correlation with patient history and other diagnostic information is  necessary to determine patient infection status. Positive results do not rule out bacterial infection or co-infection with other viruses.  The expected result is Negative.  Fact Sheet for Patients: SugarRoll.be  Fact Sheet for Healthcare Providers: https://www.woods-mathews.com/  This test is not yet approved or cleared by the [place] FDA and  has been authorized for detection and/or diagnosis of SARS-CoV-2 by FDA under an Emergency Use Authorization (EUA). This EUA will remain  in effect (meaning this test can be  used) for the duration of the COVID-19 declaration under Section 564(b)(1) of the Act, [place].  section 360bbb-3(b)(1), unless the authorization is terminated or revoked sooner.   Performed at [facility], [place] [address] [place],  [address]     Radiology Reports DG Chest 1 View  Result Date: 12/22/2020 CLINICAL DATA:  Shortness of breath EXAM: CHEST  1 VIEW COMPARISON:  10/20/2017 FINDINGS: Heart size within normal limits. No pulmonary vascular congestion. There is patchy bilateral lung opacities. IMPRESSION: Patchy bilateral lung opacities may be due to multifocal pneumonia or pulmonary edema/fluid volume overload. Electronically Signed   By: Miachel Roux M.D.   On: 12/22/2020 08:45

## 2020-12-24 NOTE — Progress Notes (Signed)
[facility] Progress Note  Subjective: 1.2 L off yest on HD. Is now below edw by 5-6kg. Feeling much better, fevers resolved. Says he might be dc'ing this weekend. 1/2 bcx + for CNSS   Vitals:   12/24/20 0455 12/24/20 0827 12/24/20 0930 12/24/20 1114  BP: 132/83 136/86 140/88 (!) 154/96  Pulse: (!) 106 (!) 106 100 (!) 103  Resp: 20 12 14 15   Temp: 98.2 F (36.8 C) 98.5 F (36.9 C) 98.5 F (36.9 C) 98.4 F (36.9 C)  TempSrc: Oral Oral Oral Oral  SpO2: 98% 95%  96%  Weight:        Exam:  .Gen alert, no distress No rash, cyanosis or gangrene Sclera anicteric, throat clear  No jvd or bruits Chest clear bilat to bases, no rales/ wheezing RRR no MRG Abd soft ntnd no mass or ascites +bs GU normal MS no joint effusions or deformity Ext no LE or UE edema, no wounds or ulcers LUE AVF+bruit Neuro is alert, Ox 3 , nf        OP HD: home HD >> M-Tu-Th-Fri. LUE AVF, edw 94.5kg  - sensipar 60mg  po qhs  - calcitriol 0.25 ug mwf  - phoslo 3 ac tid  5/12 CXR -  IMPRESSION: Patchy bilateral lung opacities may be due to multifocal pneumonia or pulmonary edema/fluid volume overload.   Assessment/ Plan: 1. COVID-19 PNA - getting O2, steroids and remdesivir. Initial CXR w/ multifocal infiltrates (mild) vs IS edema.  2. Hyperkalemia - resolved 3. ESRD - home HD patient on HD 4x /wk as above. Had HD here on Thursday and Friday (yesterday). No need for HD this weekend, well dialyzed now and euvolemic.  4. HTN/ volume - well below dry wt now, probably has lost body wt d/t COVID infection +/- other. Gets norvasc 5/d at home only, same here. Lower dry wt at dc.  5. Anemia ckd - Hb at goal, no need for esa 6. MBD ckd - cont sensipar, calcitriol and binder as at home 7. Dispo - per pmd   Rob Lavere Stork 12/24/2020, 1:55 PM   Recent Labs  Lab 12/23/20 0235 12/24/20 0153  K 5.8* 4.5  BUN 44* 42*  CREATININE 10.91* 8.63*  CALCIUM 8.5* 8.9  PHOS 5.0* 5.1*  HGB 9.8* 9.7*    Inpatient medications: . amLODipine  5 mg Oral QHS  . vitamin C  500 mg Oral Daily  . aspirin  81 mg Oral Daily  . atorvastatin  80 mg Oral Daily  . calcitRIOL  0.25 mcg Oral Q M,W,F-1800  . calcium acetate  2,001 mg Oral TID WC  . Chlorhexidine Gluconate Cloth  6 each Topical Q0600  . cinacalcet  60 mg Oral QPM  . docusate sodium  100 mg Oral BID  . glycopyrrolate  2 mg Oral BID  . heparin  5,000 Units Subcutaneous Q8H  . insulin aspart  0-6 Units Subcutaneous TID WC  . insulin aspart  4 Units Subcutaneous TID WC  . insulin glargine  32 Units Subcutaneous QHS  . levETIRAcetam  250 mg Oral QHS  . loratadine  10 mg Oral Daily  . methylPREDNISolone (SOLU-MEDROL) injection  40 mg Intravenous Q12H  . multivitamin  1 tablet Oral Daily  . pantoprazole  40 mg Oral Daily  . rOPINIRole  1 mg Oral QHS  . sodium chloride flush  3 mL Intravenous Q12H  . sodium chloride flush  3 mL Intravenous Q12H  . vancomycin variable dose per unstable renal function (  pharmacist dosing)   Does not apply See admin instructions  . zinc sulfate  220 mg Oral Daily   . sodium chloride    . sodium chloride    . sodium chloride    . remdesivir 100 mg in NS 100 mL 100 mg (12/24/20 0857)   sodium chloride, sodium chloride, sodium chloride, acetaminophen **OR** acetaminophen, albuterol, alteplase, bisacodyl, calcium carbonate (dosed in mg elemental calcium), camphor-menthol **AND** hydrOXYzine, chlorpheniramine-HYDROcodone, docusate sodium, feeding supplement (NEPRO CARB STEADY), gabapentin, guaiFENesin-dextromethorphan, heparin, heparin, lidocaine (PF), lidocaine-prilocaine, ondansetron **OR** ondansetron (ZOFRAN) IV, oxyCODONE, pentafluoroprop-tetrafluoroeth, polyethylene glycol, sodium chloride flush, sorbitol, zolpidem

## 2020-12-24 NOTE — Progress Notes (Signed)
   12/23/20 1906  Assess: MEWS Score  Temp 97.8 F (36.6 C)  BP (!) 141/85  Pulse Rate (!) 118  ECG Heart Rate (!) 120  Resp 20  SpO2 98 %  O2 Device Room Air  O2 Flow Rate (L/min) 0 L/min  Assess: MEWS Score  MEWS Temp 0  MEWS Systolic 0  MEWS Pulse 2  MEWS RR 0  MEWS LOC 0  MEWS Score 2  MEWS Score Color Yellow  Assess: if the MEWS score is Yellow or Red  Were vital signs taken at a resting state? Yes  Focused Assessment No change from prior assessment  Early Detection of Sepsis Score *See Row Information* Medium  MEWS guidelines implemented *See Row Information* Yes  Treat  MEWS Interventions  (Pt was not under my direct care at this time; MEWS increased vitals initiated)  Take Vital Signs  Increase Vital Sign Frequency  Yellow: Q 2hr X 2 then Q 4hr X 2, if remains yellow, continue Q 4hrs  Escalate  MEWS: Escalate Yellow: discuss with charge nurse/RN and consider discussing with provider and RRT  Notify: Charge Nurse/RN  Name of Charge Nurse/RN Notified Wenda Overland  Date Charge Nurse/RN Notified 12/23/20  Time Charge Nurse/RN Notified 1930  Document  Patient Outcome Other (Comment) (stable)  Progress note created (see row info) Yes

## 2020-12-25 ENCOUNTER — Other Ambulatory Visit: Payer: Self-pay | Admitting: Gastroenterology

## 2020-12-25 DIAGNOSIS — E785 Hyperlipidemia, unspecified: Secondary | ICD-10-CM

## 2020-12-25 LAB — CULTURE, BLOOD (ROUTINE X 2): Special Requests: ADEQUATE

## 2020-12-25 LAB — FERRITIN: Ferritin: 1376 ng/mL — ABNORMAL HIGH (ref 24–336)

## 2020-12-25 LAB — CBC WITH DIFFERENTIAL/PLATELET
Abs Immature Granulocytes: 0.45 10*3/uL — ABNORMAL HIGH (ref 0.00–0.07)
Basophils Absolute: 0.1 10*3/uL (ref 0.0–0.1)
Basophils Relative: 0 %
Eosinophils Absolute: 0.1 10*3/uL (ref 0.0–0.5)
Eosinophils Relative: 1 %
HCT: 30.6 % — ABNORMAL LOW (ref 39.0–52.0)
Hemoglobin: 10.1 g/dL — ABNORMAL LOW (ref 13.0–17.0)
Immature Granulocytes: 3 %
Lymphocytes Relative: 7 %
Lymphs Abs: 1.1 10*3/uL (ref 0.7–4.0)
MCH: 27.2 pg (ref 26.0–34.0)
MCHC: 33 g/dL (ref 30.0–36.0)
MCV: 82.3 fL (ref 80.0–100.0)
Monocytes Absolute: 0.8 10*3/uL (ref 0.1–1.0)
Monocytes Relative: 5 %
Neutro Abs: 13.6 10*3/uL — ABNORMAL HIGH (ref 1.7–7.7)
Neutrophils Relative %: 84 %
Platelets: 380 10*3/uL (ref 150–400)
RBC: 3.72 MIL/uL — ABNORMAL LOW (ref 4.22–5.81)
RDW: 14.5 % (ref 11.5–15.5)
WBC: 16.2 10*3/uL — ABNORMAL HIGH (ref 4.0–10.5)
nRBC: 0.1 % (ref 0.0–0.2)

## 2020-12-25 LAB — COMPREHENSIVE METABOLIC PANEL
ALT: 23 U/L (ref 0–44)
AST: 15 U/L (ref 15–41)
Albumin: 3 g/dL — ABNORMAL LOW (ref 3.5–5.0)
Alkaline Phosphatase: 80 U/L (ref 38–126)
Anion gap: 20 — ABNORMAL HIGH (ref 5–15)
BUN: 77 mg/dL — ABNORMAL HIGH (ref 6–20)
CO2: 24 mmol/L (ref 22–32)
Calcium: 9.1 mg/dL (ref 8.9–10.3)
Chloride: 89 mmol/L — ABNORMAL LOW (ref 98–111)
Creatinine, Ser: 11.72 mg/dL — ABNORMAL HIGH (ref 0.61–1.24)
GFR, Estimated: 5 mL/min — ABNORMAL LOW (ref >60)
Glucose, Bld: 341 mg/dL — ABNORMAL HIGH (ref 70–99)
Potassium: 5 mmol/L (ref 3.5–5.1)
Sodium: 133 mmol/L — ABNORMAL LOW (ref 135–145)
Total Bilirubin: 0.5 mg/dL (ref 0.3–1.2)
Total Protein: 7.4 g/dL (ref 6.5–8.1)

## 2020-12-25 LAB — GLUCOSE, CAPILLARY: Glucose-Capillary: 317 mg/dL — ABNORMAL HIGH (ref 70–99)

## 2020-12-25 LAB — PHOSPHORUS: Phosphorus: 4.5 mg/dL (ref 2.5–4.6)

## 2020-12-25 LAB — C-REACTIVE PROTEIN: CRP: 18.8 mg/dL — ABNORMAL HIGH (ref <1.0)

## 2020-12-25 LAB — MAGNESIUM: Magnesium: 2.3 mg/dL (ref 1.7–2.4)

## 2020-12-25 LAB — D-DIMER, QUANTITATIVE: D-Dimer, Quant: 0.5 ug/mL-FEU (ref 0.00–0.50)

## 2020-12-25 MED ORDER — PREDNISONE 20 MG PO TABS
40.0000 mg | ORAL_TABLET | Freq: Every day | ORAL | Status: DC
  Administered 2020-12-25: 40 mg via ORAL
  Filled 2020-12-25: qty 2

## 2020-12-25 MED ORDER — PREDNISONE 10 MG PO TABS: ORAL_TABLET | ORAL | 0 refills | Status: DC

## 2020-12-25 MED ORDER — INSULIN ASPART 100 UNIT/ML IJ SOLN
0.0000 [IU] | Freq: Three times a day (TID) | INTRAMUSCULAR | Status: DC
  Administered 2020-12-25: 11 [IU] via SUBCUTANEOUS

## 2020-12-25 NOTE — Discharge Summary (Signed)
PATIENT DETAILS Name: Hector Mahoney Age: 50 y.o. Sex: male Date of Birth: November 11, 1970 MRN: [id number]. Admitting Physician: Karmen Bongo, MD [id number], Hunt Oris, MD  Admit Date: 12/22/2020 Discharge date: 12/25/2020  Recommendations for Outpatient Follow-up:  1. Follow up with PCP in 1-2 weeks 2. Please obtain CMP/CBC in one week 3. Please follow blood cultures until final. 4. Repeat two-view x-ray in 4 to 6 weeks.  Admitted From:  Home  Disposition: [place]: No  Equipment/Devices: None  Discharge Condition: Stable  CODE STATUS: FULL CODE  Diet recommendation:  Diet Order            Diet - low sodium heart healthy           Diet renal/carb modified with fluid restriction Diet-HS Snack? Nothing; Fluid restriction: 1500 mL Fluid; Room service appropriate? Yes; Fluid consistency: Thin  Diet effective now                  Brief Narrative: Patient is a 50 y.o. male with PMHx of ESRD on home HD 4 times/week, DM-1, HTN, HLD, chronic diastolic heart failure-who recently was exposed to COVID-19 infected family member-he subsequently started developing fever, cough, orthopnea, myalgias-his home COVID test was apparently positive on 5/7-he subsequently presented to the hospital on 5/12-UA was found to have COVID-19 pneumonia, hyperkalemia-and was subsequently admitted to the hospitalist service.  COVID-19 vaccinated status: Vaccinated including booster.  Significant Events: 5/12>> Admit to [facility] for hyperkalemia, COVID-19 pneumonia  Significant studies: 5/12>>Chest x-ray: Bilateral patchy opacities  COVID-19 medications: Steroids: 5/12>> Remdesivir: 5/12>> 5/15  Antibiotics: Vancomycin: 5/12 x1  Microbiology data: 5/12 >>blood culture: 1/2-staph epidermidis (likely a contaminant) 5/13>> blood cultures: negative so far   Brief Hospital Course: Acute hypoxic respiratory failure due to COVID-19 pneumonia:  Rapidly improved after  initiation of steroid/Remdesivir-has been on room air for the past several days.  We will place him on tapering steroids for a few more days.  PCP to ensure repeat two-view x-ray in 4 to 6 weeks.   COVID-19 Labs:  Recent Labs    12/22/20 1357 12/23/20 0235 12/24/20 0153 12/25/20 0213  DDIMER 2.42* 2.03* 0.87* 0.50  FERRITIN 1,170* 1,510* 1,490* 1,376*  LDH 186  --   --   --   CRP 36.6* 38.3* 28.4* 18.8*    Lab Results  Component Value Date   SARSCOV2NAA POSITIVE (A) 12/22/2020   SARSCOV2NAA Not Detected 08/17/2020   [place] NEGATIVE 06/02/2019     Hyperkalemia: Missed home HD x1-urgently dialyzed on 5/12 when he first presented to the hospital-potassium has subsequently normalized.    Coag negative staph bacteremia: 1/2 blood cultures on 5/12 positive for staph epi-given 1 dose of IV vancomycin yesterday-patient has aVF for HD.    Repeat blood cultures the very next day on 5/13 negative so far.  High suspicion that this is a contaminant.  Doubt any further treatment required.  PCP and outpatient nephrology to follow blood cultures until final  Chronic diastolic heart failure: Volume status stable-we will manage with HD.    HTN: Continue Norvasc  HLD: Continue Lipitor  Peripheral neuropathy: Likely related to DM-continue Neurontin  History of myoclonic jerks: Continue Keppra  DM-2 (A1c 7.3 on 5/12) with uncontrolled hyperglycemia due to steroids:  CBGs were elevated due to high-dose steroids-however since steroids being tapered down-suspect they will continue to improve-placing patient back on his usual home regimen on discharge.    Obesity: Estimated body mass index is 31.56 kg/m as  calculated from the following:   Height as of 11/01/20: 5\' 6"  (1.676 m).   Weight as of this encounter: 88.7 kg.    Discharge Diagnoses:  Principal Problem:   COVID-19 Active Problems:   Type 1 diabetes mellitus ([place])   Dyslipidemia   Essential hypertension   ESRD on  hemodialysis [facility])   Discharge Instructions:    Person Under Monitoring Name: Hector Mahoney  Location: [place] [address]   Infection Prevention Recommendations for Individuals Confirmed to have, or Being Evaluated for, 2019 Novel Coronavirus (COVID-19) Infection Who Receive Care at Home  Individuals who are confirmed to have, or are being evaluated for, COVID-19 should follow the prevention steps below until a healthcare provider or local or state health department says they can return to normal activities.  Stay home except to get medical care You should restrict activities outside your home, except for getting medical care. Do not go to work, school, or public areas, and do not use public transportation or taxis.  Call ahead before visiting your doctor Before your medical appointment, call the healthcare provider and tell them that you have, or are being evaluated for, COVID-19 infection. This will help the healthcare provider's office take steps to keep other people from getting infected. Ask your healthcare provider to call the local or state health department.  Monitor your symptoms Seek prompt medical attention if your illness is worsening (e.g., difficulty breathing). Before going to your medical appointment, call the healthcare provider and tell them that you have, or are being evaluated for, COVID-19 infection. Ask your healthcare provider to call the local or state health department.  Wear a facemask You should wear a facemask that covers your nose and mouth when you are in the same room with other people and when you visit a healthcare provider. People who live with or visit you should also wear a facemask while they are in the same room with you.  Separate yourself from other people in your home As much as possible, you should stay in a different room from other people in your home. Also, you should use a separate bathroom, if  available.  Avoid sharing household items You should not share dishes, drinking glasses, cups, eating utensils, towels, bedding, or other items with other people in your home. After using these items, you should wash them thoroughly with soap and water.  Cover your coughs and sneezes Cover your mouth and nose with a tissue when you cough or sneeze, or you can cough or sneeze into your sleeve. Throw used tissues in a lined trash can, and immediately wash your hands with soap and water for at least 20 seconds or use an alcohol-based hand rub.  Wash your [facility] your hands often and thoroughly with soap and water for at least 20 seconds. You can use an alcohol-based hand sanitizer if soap and water are not available and if your hands are not visibly dirty. Avoid touching your eyes, nose, and mouth with unwashed hands.   Prevention Steps for Caregivers and Household Members of Individuals Confirmed to have, or Being Evaluated for, COVID-19 Infection Being Cared for in the Home  If you live with, or provide care at home for, a person confirmed to have, or being evaluated for, COVID-19 infection please follow these guidelines to prevent infection:  Follow healthcare provider's instructions Make sure that you understand and can help the patient follow any healthcare provider instructions for all care.  Provide for the  patient's basic needs You should help the patient with basic needs in the home and provide support for getting groceries, prescriptions, and other personal needs.  Monitor the patient's symptoms If they are getting sicker, call his or her medical provider and tell them that the patient has, or is being evaluated for, COVID-19 infection. This will help the healthcare provider's office take steps to keep other people from getting infected. Ask the healthcare provider to call the local or state health department.  Limit the number of people who have contact with the  patient  If possible, have only one caregiver for the patient.  Other household members should stay in another home or place of residence. If this is not possible, they should stay  in another room, or be separated from the patient as much as possible. Use a separate bathroom, if available.  Restrict visitors who do not have an essential need to be in the home.  Keep older adults, very young children, and other sick people away from the patient Keep older adults, very young children, and those who have compromised immune systems or chronic health conditions away from the patient. This includes people with chronic heart, lung, or kidney conditions, diabetes, and cancer.  Ensure good ventilation Make sure that shared spaces in the home have good air flow, such as from an air conditioner or an opened window, weather permitting.  Wash your hands often  Wash your hands often and thoroughly with soap and water for at least 20 seconds. You can use an alcohol based hand sanitizer if soap and water are not available and if your hands are not visibly dirty.  Avoid touching your eyes, nose, and mouth with unwashed hands.  Use disposable paper towels to dry your hands. If not available, use dedicated cloth towels and replace them when they become wet.  Wear a facemask and gloves  Wear a disposable facemask at all times in the room and gloves when you touch or have contact with the patient's blood, body fluids, and/or secretions or excretions, such as sweat, saliva, sputum, nasal mucus, vomit, urine, or feces.  Ensure the mask fits over your nose and mouth tightly, and do not touch it during use.  Throw out disposable facemasks and gloves after using them. Do not reuse.  Wash your hands immediately after removing your facemask and gloves.  If your personal clothing becomes contaminated, carefully remove clothing and launder. Wash your hands after handling contaminated clothing.  Place all used  disposable facemasks, gloves, and other waste in a lined container before disposing them with other household waste.  Remove gloves and wash your hands immediately after handling these items.  Do not share dishes, glasses, or other household items with the patient  Avoid sharing household items. You should not share dishes, drinking glasses, cups, eating utensils, towels, bedding, or other items with a patient who is confirmed to have, or being evaluated for, COVID-19 infection.  After the person uses these items, you should wash them thoroughly with soap and water.  Wash laundry thoroughly  Immediately remove and wash clothes or bedding that have blood, body fluids, and/or secretions or excretions, such as sweat, saliva, sputum, nasal mucus, vomit, urine, or feces, on them.  Wear gloves when handling laundry from the patient.  Read and follow directions on labels of laundry or clothing items and detergent. In general, wash and dry with the warmest temperatures recommended on the label.  Clean all areas the individual has used often  Clean all touchable surfaces, such as counters, tabletops, doorknobs, bathroom fixtures, toilets, phones, keyboards, tablets, and bedside tables, every day. Also, clean any surfaces that may have blood, body fluids, and/or secretions or excretions on them.  Wear gloves when cleaning surfaces the patient has come in contact with.  Use a diluted bleach solution (e.g., dilute bleach with 1 part bleach and 10 parts water) or a household disinfectant with a label that says EPA-registered for coronaviruses. To make a bleach solution at home, add 1 tablespoon of bleach to 1 quart (4 cups) of water. For a larger supply, add  cup of bleach to 1 gallon (16 cups) of water.  Read labels of cleaning products and follow recommendations provided on product labels. Labels contain instructions for safe and effective use of the cleaning product including precautions you should  take when applying the product, such as wearing gloves or eye protection and making sure you have good ventilation during use of the product.  Remove gloves and wash hands immediately after cleaning.  Monitor yourself for signs and symptoms of illness Caregivers and household members are considered close contacts, should monitor their health, and will be asked to limit movement outside of the home to the extent possible. Follow the monitoring steps for close contacts listed on the symptom monitoring form.   ? If you have additional questions, contact your local health department or call the epidemiologist on call at [phone] (available 24/7). ? This guidance is subject to change. For the most up-to-date guidance from CDC, please refer to their website: YouBlogs.pl    Activity:  As tolerated   Discharge Instructions    Call MD for:  difficulty breathing, headache or visual disturbances   Complete by: As directed    Diet - low sodium heart healthy   Complete by: As directed    Discharge instructions   Complete by: As directed    Follow with Primary MD  Biagio Borg, MD in 1-2 weeks  Please get a complete blood count and chemistry panel checked by your Primary MD at your next visit, and again as instructed by your Primary MD.  Get Medicines reviewed and adjusted: Please take all your medications with you for your next visit with your Primary MD  Laboratory/radiological data: Please request your Primary MD to go over all hospital tests and procedure/radiological results at the follow up, please ask your Primary MD to get all Hospital records sent to his/her office.  In some cases, they will be blood work, cultures and biopsy results pending at the time of your discharge. Please request that your primary care M.D. follows up on these results.  Also Note the following: If you experience worsening of your admission  symptoms, develop shortness of breath, life threatening emergency, suicidal or homicidal thoughts you must seek medical attention immediately by calling 911 or calling your MD immediately  if symptoms less severe.  You must read complete instructions/literature along with all the possible adverse reactions/side effects for all the Medicines you take and that have been prescribed to you. Take any new Medicines after you have completely understood and accpet all the possible adverse reactions/side effects.   Do not drive when taking Pain medications or sleeping medications (Benzodaizepines)  Do not take more than prescribed Pain, Sleep and Anxiety Medications. It is not advisable to combine anxiety,sleep and pain medications without talking with your primary care practitioner  Special Instructions: If you have smoked or chewed Tobacco  in the last 2 yrs  please stop smoking, stop any regular Alcohol  and or any Recreational drug use.  Wear Seat belts while driving.  Please note: You were cared for by a hospitalist during your hospital stay. Once you are discharged, your primary care physician will handle any further medical issues. Please note that NO REFILLS for any discharge medications will be authorized once you are discharged, as it is imperative that you return to your primary care physician (or establish a relationship with a primary care physician if you do not have one) for your post hospital discharge needs so that they can reassess your need for medications and monitor your lab values.   Increase activity slowly   Complete by: As directed    No dressing needed   Complete by: As directed      Allergies as of 12/25/2020   No Known Allergies     Medication List    TAKE these medications   acetaminophen 500 MG tablet Commonly known as: TYLENOL Take 500 mg by mouth every 6 (six) hours as needed for mild pain.   albuterol 108 (90 Base) MCG/ACT inhaler Commonly known as: VENTOLIN  HFA Inhale 2 puffs into the lungs every 6 (six) hours as needed for wheezing or shortness of breath.   amLODipine 5 MG tablet Commonly known as: NORVASC Take 1 tablet (5 mg total) by mouth at bedtime.   aspirin 81 MG EC tablet Take 1 tablet (81 mg total) by mouth daily.   atorvastatin 80 MG tablet Commonly known as: LIPITOR Take 80 mg by mouth daily.   calcitRIOL 0.25 MCG capsule Commonly known as: Rocaltrol Take 1 capsule (0.25 mcg total) by mouth every Monday, Wednesday, and Friday at 6 PM.   calcium acetate 667 MG capsule Commonly known as: PHOSLO Take 3 capsules (2,001 mg total) by mouth 3 (three) times daily with meals.   cinacalcet 30 MG tablet Commonly known as: SENSIPAR Take 60 mg by mouth every evening.   fexofenadine 180 MG tablet Commonly known as: ALLEGRA Take 1 tablet (180 mg total) by mouth daily.   FreeStyle Libre 14 Day Sensor Misc 1 each by Other route daily.   gabapentin 300 MG capsule Commonly known as: NEURONTIN 1-2 tab by mouth at bedtime as needed What changed:   how much to take  how to take this  when to take this  reasons to take this  additional instructions   glycopyrrolate 2 MG tablet Commonly known as: ROBINUL Take 1 tablet by mouth twice daily   heparin 1000 unit/mL Soln injection 1,000 Units by Dialysis route one time in dialysis. On Dialysis days (4 times per week)   HYDROcodone bit-homatropine 5-1.5 MG/5ML syrup Commonly known as: HYCODAN Take 5 mLs by mouth every 6 (six) hours as needed for up to 10 days for cough.   insulin aspart 100 UNIT/ML injection Commonly known as: novoLOG INJECT 8 TO 12 UNITS INTO THE SKIN THREE TIMES DAILY WITH MEALS What changed: See the new instructions.   insulin glargine 100 UNIT/ML injection Commonly known as: Lantus Inject 0.25 mLs (25 Units total) into the skin at bedtime.   levETIRAcetam 250 MG tablet Commonly known as: Keppra Take 1 tablet (250 mg total) by mouth at bedtime.    meclizine 12.5 MG tablet Commonly known as: ANTIVERT TAKE 1 TABLET BY MOUTH THREE TIMES DAILY AS NEEDED FOR DIZZINESS What changed: See the new instructions.   molnupiravir EUA 200 mg Caps Take 4 capsules (800 mg total) by mouth 2 (two)  times daily for 5 days. What changed: when to take this   multivitamin Tabs tablet Take 1 tablet by mouth daily.   omeprazole 40 MG capsule Commonly known as: PRILOSEC Take 1 capsule (40 mg total) by mouth 2 (two) times daily.   ondansetron 4 MG tablet Commonly known as: Zofran Take 1 tablet (4 mg total) by mouth every 8 (eight) hours as needed for nausea or vomiting.   polyethylene glycol powder 17 GM/SCOOP powder Commonly known as: GLYCOLAX/MIRALAX Take 1 Container by mouth daily as needed for mild constipation.   predniSONE 10 MG tablet Commonly known as: DELTASONE Take 40 mg daily for 1 day, 30 mg daily for 1 day, 20 mg daily for 1 days,10 mg daily for 1 day, then stop   rOPINIRole 1 MG tablet Commonly known as: REQUIP Take 1 mg by mouth at bedtime.   rosuvastatin 10 MG tablet Commonly known as: CRESTOR Take 1 tablet (10 mg total) by mouth daily.   triamcinolone 55 MCG/ACT Aero nasal inhaler Commonly known as: NASACORT Place 2 sprays into the nose daily. What changed:   when to take this  reasons to take this            Discharge Care Instructions  (From admission, onward)         Start     Ordered   12/25/20 0000  No dressing needed        12/25/20 5009          Follow-up Information    Biagio Borg, MD. Schedule an appointment as soon as possible for a visit in 1 week(s).   Specialties: Internal Medicine, Radiology Contact information: [place] [address] [phone]        Skeet Latch, MD Follow up in 1 month(s).   Specialty: Cardiology Contact information: [address] [place] [address] [phone]              No Known  Allergies   Consultations:   None   Other Procedures/Studies: DG Chest 1 View  Result Date: 12/22/2020 CLINICAL DATA:  Shortness of breath EXAM: CHEST  1 VIEW COMPARISON:  10/20/2017 FINDINGS: Heart size within normal limits. No pulmonary vascular congestion. There is patchy bilateral lung opacities. IMPRESSION: Patchy bilateral lung opacities may be due to multifocal pneumonia or pulmonary edema/fluid volume overload. Electronically Signed   By: Miachel Roux M.D.   On: 12/22/2020 08:45     TODAY-DAY OF DISCHARGE:  Subjective:   Hector Mahoney today has no headache,no chest abdominal pain,no new weakness tingling or numbness, feels much better wants to go home today.   Objective:   Blood pressure (!) 143/85, pulse 94, temperature 98.1 F (36.7 C), temperature source Axillary, resp. rate 13, weight 88.7 kg, SpO2 92 %.  Intake/Output Summary (Last 24 hours) at 12/25/2020 0927 Last data filed at 12/24/2020 2100 Gross per 24 hour  Intake 840 ml  Output --  Net 840 ml   Filed Weights   12/22/20 1200 12/23/20 1400 12/23/20 1743  Weight: 98 kg 89.7 kg 88.7 kg    Exam: Awake Alert, Oriented *3, No new F.N deficits, Normal affect [place].AT,PERRAL Supple Neck,No JVD, No cervical lymphadenopathy appriciated.  Symmetrical Chest wall movement, Good air movement bilaterally, CTAB RRR,No Gallops,Rubs or new Murmurs, No Parasternal Heave +ve B.Sounds, Abd Soft, Non tender, No organomegaly appriciated, No rebound -guarding or rigidity. No Cyanosis, Clubbing or edema, No new Rash or bruise   PERTINENT RADIOLOGIC STUDIES:  DG Chest 1 View  Result Date: 12/22/2020 CLINICAL DATA:  Shortness of breath EXAM: CHEST  1 VIEW COMPARISON:  10/20/2017 FINDINGS: Heart size within normal limits. No pulmonary vascular congestion. There is patchy bilateral lung opacities. IMPRESSION: Patchy bilateral lung opacities may be due to multifocal pneumonia or pulmonary edema/fluid volume overload.  Electronically Signed   By: Miachel Roux M.D.   On: 12/22/2020 08:45     PERTINENT LAB RESULTS: CBC: Recent Labs    12/24/20 0153 12/25/20 0213  WBC 15.2* 16.2*  HGB 9.7* 10.1*  HCT 30.0* 30.6*  PLT 341 380   CMET CMP     Component Value Date/Time   NA 133 (L) 12/25/2020 0213   K 5.0 12/25/2020 0213   CL 89 (L) 12/25/2020 0213   CO2 24 12/25/2020 0213   GLUCOSE 341 (H) 12/25/2020 0213   BUN 77 (H) 12/25/2020 0213   CREATININE 11.72 (H) 12/25/2020 0213   CALCIUM 9.1 12/25/2020 0213   CALCIUM 7.5 (L) 09/27/2017 1449   PROT 7.4 12/25/2020 0213   ALBUMIN 3.0 (L) 12/25/2020 0213   AST 15 12/25/2020 0213   ALT 23 12/25/2020 0213   ALKPHOS 80 12/25/2020 0213   BILITOT 0.5 12/25/2020 0213   GFRNONAA 5 (L) 12/25/2020 0213   GFRAA 5 (L) 07/20/2018 0039    GFR Estimated Creatinine Clearance: 8 mL/min (A) (by C-G formula based on SCr of 11.72 mg/dL (H)). No results for input(s): LIPASE, AMYLASE in the last 72 hours. No results for input(s): CKTOTAL, CKMB, CKMBINDEX, TROPONINI in the last 72 hours. Invalid input(s): POCBNP Recent Labs    12/24/20 0153 12/25/20 0213  DDIMER 0.87* 0.50   Recent Labs    12/22/20 1357  HGBA1C 7.3*   No results for input(s): CHOL, HDL, LDLCALC, TRIG, CHOLHDL, LDLDIRECT in the last 72 hours. No results for input(s): TSH, T4TOTAL, T3FREE, THYROIDAB in the last 72 hours.  Invalid input(s): FREET3 Recent Labs    12/24/20 0153 12/25/20 0213  FERRITIN 1,490* 1,376*   Coags: No results for input(s): INR in the last 72 hours.  Invalid input(s): PT Microbiology: Recent Results (from the past 240 hour(s))  Blood culture (routine x 2)     Status: None (Preliminary result)   Collection Time: 12/22/20  8:00 AM   Specimen: BLOOD  Result Value Ref Range Status   Specimen Description BLOOD SITE NOT SPECIFIED  Final   Special Requests   Final    BOTTLES DRAWN AEROBIC AND ANAEROBIC Blood Culture adequate volume   Culture   Final    NO GROWTH  2 DAYS Performed at [facility], [address], [place], [place] [address]    Report Status PENDING  Incomplete  Blood culture (routine x 2)     Status: Abnormal (Preliminary result)   Collection Time: 12/22/20  1:57 PM   Specimen: BLOOD RIGHT ARM  Result Value Ref Range Status   Specimen Description BLOOD RIGHT ARM  Final   Special Requests   Final    BOTTLES DRAWN AEROBIC AND ANAEROBIC Blood Culture adequate volume   Culture  Setup Time   Final    GRAM POSITIVE COCCI IN CLUSTERS IN BOTH AEROBIC AND ANAEROBIC BOTTLES CRITICAL RESULT CALLED TO, READ BACK BY AND VERIFIED WITH: PHARMD A.WOLF AT 1236  ON 12/23/2020 BY T.SAAD.    Culture (A)  Final    STAPHYLOCOCCUS EPIDERMIDIS THE SIGNIFICANCE OF ISOLATING THIS ORGANISM FROM A SINGLE SET OF BLOOD CULTURES WHEN MULTIPLE SETS ARE DRAWN IS UNCERTAIN.  PLEASE NOTIFY THE MICROBIOLOGY DEPARTMENT WITHIN ONE WEEK IF SPECIATION AND SENSITIVITIES ARE REQUIRED. Performed at [facility], [place] [address] [place], [place] [address]    Report Status PENDING  Incomplete  Blood Culture ID Panel (Reflexed)     Status: Abnormal   Collection Time: 12/22/20  1:57 PM  Result Value Ref Range Status   Enterococcus faecalis NOT DETECTED NOT DETECTED Final   Enterococcus Faecium NOT DETECTED NOT DETECTED Final   Listeria monocytogenes NOT DETECTED NOT DETECTED Final   Staphylococcus species DETECTED (A) NOT DETECTED Final    Comment: CRITICAL RESULT CALLED TO, READ BACK BY AND VERIFIED WITH: PHARMD A.WOLF AT 1236 ON 12/23/2020 BY T.SAAD.    Staphylococcus aureus (BCID) NOT DETECTED NOT DETECTED Final   Staphylococcus epidermidis DETECTED (A) NOT DETECTED Final    Comment: Methicillin (oxacillin) resistant coagulase negative staphylococcus. Possible blood culture contaminant (unless isolated from more than one blood culture draw or clinical case suggests pathogenicity). No antibiotic treatment is indicated for blood  culture  contaminants. CRITICAL RESULT CALLED TO, READ BACK BY AND VERIFIED WITH: PHARMD A.WOLF AT 1236 ON 12/23/2020 BY T.SAAD.    Staphylococcus lugdunensis NOT DETECTED NOT DETECTED Final   Streptococcus species NOT DETECTED NOT DETECTED Final   Streptococcus agalactiae NOT DETECTED NOT DETECTED Final   Streptococcus pneumoniae NOT DETECTED NOT DETECTED Final   Streptococcus pyogenes NOT DETECTED NOT DETECTED Final   A.calcoaceticus-baumannii NOT DETECTED NOT DETECTED Final   Bacteroides fragilis NOT DETECTED NOT DETECTED Final   Enterobacterales NOT DETECTED NOT DETECTED Final   Enterobacter cloacae complex NOT DETECTED NOT DETECTED Final   Escherichia coli NOT DETECTED NOT DETECTED Final   Klebsiella aerogenes NOT DETECTED NOT DETECTED Final   Klebsiella oxytoca NOT DETECTED NOT DETECTED Final   Klebsiella pneumoniae NOT DETECTED NOT DETECTED Final   Proteus species NOT DETECTED NOT DETECTED Final   Salmonella species NOT DETECTED NOT DETECTED Final   Serratia marcescens NOT DETECTED NOT DETECTED Final   Haemophilus influenzae NOT DETECTED NOT DETECTED Final   Neisseria meningitidis NOT DETECTED NOT DETECTED Final   Pseudomonas aeruginosa NOT DETECTED NOT DETECTED Final   Stenotrophomonas maltophilia NOT DETECTED NOT DETECTED Final   Candida albicans NOT DETECTED NOT DETECTED Final   Candida auris NOT DETECTED NOT DETECTED Final   Candida glabrata NOT DETECTED NOT DETECTED Final   Candida krusei NOT DETECTED NOT DETECTED Final   Candida parapsilosis NOT DETECTED NOT DETECTED Final   Candida tropicalis NOT DETECTED NOT DETECTED Final   Cryptococcus neoformans/gattii NOT DETECTED NOT DETECTED Final   Methicillin resistance mecA/C DETECTED (A) NOT DETECTED Final    Comment: CRITICAL RESULT CALLED TO, READ BACK BY AND VERIFIED WITH: PHARMD A.WOLF AT 1236 ON 12/23/2020 BY T.SAAD. Performed at [facility], [place] [address] [place], [place] [address]   SARS CORONAVIRUS 2 (TAT 6-24 HRS)  Nasopharyngeal Nasopharyngeal Swab     Status: Abnormal   Collection Time: 12/22/20  3:50 PM   Specimen: Nasopharyngeal Swab  Result Value Ref Range Status   SARS Coronavirus 2 POSITIVE (A) NEGATIVE Final    Comment: (NOTE) SARS-CoV-2 target nucleic acids are DETECTED.  The SARS-CoV-2 RNA is generally detectable in upper and lower respiratory specimens during the acute phase of infection. Positive results are indicative of the presence of SARS-CoV-2 RNA. Clinical correlation with patient history and other diagnostic information is  necessary to determine patient infection status. Positive results do not rule out bacterial infection or co-infection with other viruses.  The expected result is Negative.  Fact Sheet for Patients: SugarRoll.be  Fact Sheet for Healthcare Providers: https://www.woods-mathews.com/  This test is not yet approved or cleared by the [place] FDA and  has been authorized for detection and/or diagnosis of SARS-CoV-2 by FDA under an Emergency Use Authorization (EUA). This EUA will remain  in effect (meaning this test can be used) for the duration of the COVID-19 declaration under Section 564(b)(1) of the Act, 21 U. S.C. section 360bbb-3(b)(1), unless the authorization is terminated or revoked sooner.   Performed at [facility], [place] [address] [place], [place] [address]   Culture, blood (routine x 2)     Status: None (Preliminary result)   Collection Time: 12/23/20 11:45 AM   Specimen: BLOOD RIGHT HAND  Result Value Ref Range Status   Specimen Description BLOOD RIGHT HAND  Final   Special Requests   Final    BOTTLES DRAWN AEROBIC ONLY Blood Culture adequate volume   Culture   Final    NO GROWTH 1 DAY Performed at [facility], [place] [address] [place], [place] [address]    Report Status PENDING  Incomplete  Culture, blood (routine x 2)     Status: None (Preliminary result)   Collection Time:  12/23/20 12:00 PM   Specimen: BLOOD RIGHT FOREARM  Result Value Ref Range Status   Specimen Description BLOOD RIGHT FOREARM  Final   Special Requests   Final    BOTTLES DRAWN AEROBIC ONLY Blood Culture adequate volume   Culture   Final    NO GROWTH 1 DAY Performed at [facility], [place] [address], [place],  [address]    Report Status PENDING  Incomplete    FURTHER DISCHARGE INSTRUCTIONS:  Get Medicines reviewed and adjusted: Please take all your medications with you for your next visit with your Primary MD  Laboratory/radiological data: Please request your Primary MD to go over all hospital tests and procedure/radiological results at the follow up, please ask your Primary MD to get all Hospital records sent to his/her office.  In some cases, they will be blood work, cultures and biopsy results pending at the time of your discharge. Please request that your primary care M.D. goes through all the records of your hospital data and follows up on these results.  Also Note the following: If you experience worsening of your admission symptoms, develop shortness of breath, life threatening emergency, suicidal or homicidal thoughts you must seek medical attention immediately by calling 911 or calling your MD immediately  if symptoms less severe.  You must read complete instructions/literature along with all the possible adverse reactions/side effects for all the Medicines you take and that have been prescribed to you. Take any new Medicines after you have completely understood and accpet all the possible adverse reactions/side effects.   Do not drive when taking Pain medications or sleeping medications (Benzodaizepines)  Do not take more than prescribed Pain, Sleep and Anxiety Medications. It is not advisable to combine anxiety,sleep and pain medications without talking with your primary care practitioner  Special Instructions: If you have smoked or chewed Tobacco  in the last 2  yrs please stop smoking, stop any regular Alcohol  and or any Recreational drug use.  Wear Seat belts while driving.  Please note: You were cared for by a hospitalist during your hospital stay. Once you are discharged, your primary care physician will handle any further medical issues. Please note that NO REFILLS for any  discharge medications will be authorized once you are discharged, as it is imperative that you return to your primary care physician (or establish a relationship with a primary care physician if you do not have one) for your post hospital discharge needs so that they can reassess your need for medications and monitor your lab values.  Total Time spent coordinating discharge including counseling, education and face to face time equals 35 minutes.  SignedOren Binet 12/25/2020 9:27 AM

## 2020-12-25 NOTE — Discharge Instructions (Signed)
Person Under Monitoring Name: Hector Mahoney  Location: [place] [address]   Infection Prevention Recommendations for Individuals Confirmed to have, or Being Evaluated for, 2019 Novel Coronavirus (COVID-19) Infection Who Receive Care at Home  Individuals who are confirmed to have, or are being evaluated for, COVID-19 should follow the prevention steps below until a healthcare provider or local or state health department says they can return to normal activities.  Stay home except to get medical care You should restrict activities outside your home, except for getting medical care. Do not go to work, school, or public areas, and do not use public transportation or taxis.  Call ahead before visiting your doctor Before your medical appointment, call the healthcare provider and tell them that you have, or are being evaluated for, COVID-19 infection. This will help the healthcare provider's office take steps to keep other people from getting infected. Ask your healthcare provider to call the local or state health department.  Monitor your symptoms Seek prompt medical attention if your illness is worsening (e.g., difficulty breathing). Before going to your medical appointment, call the healthcare provider and tell them that you have, or are being evaluated for, COVID-19 infection. Ask your healthcare provider to call the local or state health department.  Wear a facemask You should wear a facemask that covers your nose and mouth when you are in the same room with other people and when you visit a healthcare provider. People who live with or visit you should also wear a facemask while they are in the same room with you.  Separate yourself from other people in your home As much as possible, you should stay in a different room from other people in your home. Also, you should use a separate bathroom, if available.  Avoid sharing household items You should not  share dishes, drinking glasses, cups, eating utensils, towels, bedding, or other items with other people in your home. After using these items, you should wash them thoroughly with soap and water.  Cover your coughs and sneezes Cover your mouth and nose with a tissue when you cough or sneeze, or you can cough or sneeze into your sleeve. Throw used tissues in a lined trash can, and immediately wash your hands with soap and water for at least 20 seconds or use an alcohol-based hand rub.  Wash your [facility] your hands often and thoroughly with soap and water for at least 20 seconds. You can use an alcohol-based hand sanitizer if soap and water are not available and if your hands are not visibly dirty. Avoid touching your eyes, nose, and mouth with unwashed hands.   Prevention Steps for Caregivers and Household Members of Individuals Confirmed to have, or Being Evaluated for, COVID-19 Infection Being Cared for in the Home  If you live with, or provide care at home for, a person confirmed to have, or being evaluated for, COVID-19 infection please follow these guidelines to prevent infection:  Follow healthcare provider's instructions Make sure that you understand and can help the patient follow any healthcare provider instructions for all care.  Provide for the patient's basic needs You should help the patient with basic needs in the home and provide support for getting groceries, prescriptions, and other personal needs.  Monitor the patient's symptoms If they are getting sicker, call his or her medical provider and tell them that the patient has, or is being evaluated for, COVID-19 infection. This will help the healthcare provider's office  take steps to keep other people from getting infected. Ask the healthcare provider to call the local or state health department.  Limit the number of people who have contact with the patient  If possible, have only one caregiver for the  patient.  Other household members should stay in another home or place of residence. If this is not possible, they should stay  in another room, or be separated from the patient as much as possible. Use a separate bathroom, if available.  Restrict visitors who do not have an essential need to be in the home.  Keep older adults, very young children, and other sick people away from the patient Keep older adults, very young children, and those who have compromised immune systems or chronic health conditions away from the patient. This includes people with chronic heart, lung, or kidney conditions, diabetes, and cancer.  Ensure good ventilation Make sure that shared spaces in the home have good air flow, such as from an air conditioner or an opened window, weather permitting.  Wash your hands often  Wash your hands often and thoroughly with soap and water for at least 20 seconds. You can use an alcohol based hand sanitizer if soap and water are not available and if your hands are not visibly dirty.  Avoid touching your eyes, nose, and mouth with unwashed hands.  Use disposable paper towels to dry your hands. If not available, use dedicated cloth towels and replace them when they become wet.  Wear a facemask and gloves  Wear a disposable facemask at all times in the room and gloves when you touch or have contact with the patient's blood, body fluids, and/or secretions or excretions, such as sweat, saliva, sputum, nasal mucus, vomit, urine, or feces.  Ensure the mask fits over your nose and mouth tightly, and do not touch it during use.  Throw out disposable facemasks and gloves after using them. Do not reuse.  Wash your hands immediately after removing your facemask and gloves.  If your personal clothing becomes contaminated, carefully remove clothing and launder. Wash your hands after handling contaminated clothing.  Place all used disposable facemasks, gloves, and other waste in a lined  container before disposing them with other household waste.  Remove gloves and wash your hands immediately after handling these items.  Do not share dishes, glasses, or other household items with the patient  Avoid sharing household items. You should not share dishes, drinking glasses, cups, eating utensils, towels, bedding, or other items with a patient who is confirmed to have, or being evaluated for, COVID-19 infection.  After the person uses these items, you should wash them thoroughly with soap and water.  Wash laundry thoroughly  Immediately remove and wash clothes or bedding that have blood, body fluids, and/or secretions or excretions, such as sweat, saliva, sputum, nasal mucus, vomit, urine, or feces, on them.  Wear gloves when handling laundry from the patient.  Read and follow directions on labels of laundry or clothing items and detergent. In general, wash and dry with the warmest temperatures recommended on the label.  Clean all areas the individual has used often  Clean all touchable surfaces, such as counters, tabletops, doorknobs, bathroom fixtures, toilets, phones, keyboards, tablets, and bedside tables, every day. Also, clean any surfaces that may have blood, body fluids, and/or secretions or excretions on them.  Wear gloves when cleaning surfaces the patient has come in contact with.  Use a diluted bleach solution (e.g., dilute bleach with 1 part  bleach and 10 parts water) or a household disinfectant with a label that says EPA-registered for coronaviruses. To make a bleach solution at home, add 1 tablespoon of bleach to 1 quart (4 cups) of water. For a larger supply, add  cup of bleach to 1 gallon (16 cups) of water.  Read labels of cleaning products and follow recommendations provided on product labels. Labels contain instructions for safe and effective use of the cleaning product including precautions you should take when applying the product, such as wearing gloves or  eye protection and making sure you have good ventilation during use of the product.  Remove gloves and wash hands immediately after cleaning.  Monitor yourself for signs and symptoms of illness Caregivers and household members are considered close contacts, should monitor their health, and will be asked to limit movement outside of the home to the extent possible. Follow the monitoring steps for close contacts listed on the symptom monitoring form.   ? If you have additional questions, contact your local health department or call the epidemiologist on call at [phone] (available 24/7). ? This guidance is subject to change. For the most up-to-date guidance from [facility], please refer to their website: [email or web address].pl

## 2020-12-25 NOTE — Progress Notes (Signed)
[facility] Progress Note  Subjective: Patient not seen directly today given COVID-19 + status, utilizing data taken from chart +/- discussions w/ providers and staff.     Vitals:   12/24/20 1706 12/24/20 2032 12/25/20 0150 12/25/20 0442  BP: (!) 152/89 (!) 167/94 (!) 150/87 (!) 143/85  Pulse: 100 100 98 94  Resp: 20 19 15 13   Temp: 98.3 F (36.8 C) 98 F (36.7 C)  98.1 F (36.7 C)  TempSrc: Oral Axillary  Axillary  SpO2: 91% 95%  92%  Weight:        Exam: Patient not seen directly today given COVID-19 + status, utilizing data taken from chart +/- discussions w/ providers and staff.          OP HD: home HD >> M-Tu-Th-Fri. LUE AVF, edw 94.5kg  - sensipar 60mg  po qhs  - calcitriol 0.25 ug mwf  - phoslo 3 ac tid  5/12 CXR -  IMPRESSION: Patchy bilateral lung opacities may be due to multifocal pneumonia or pulmonary edema/fluid volume overload.   Assessment/ Plan: 1. COVID-19 PNA - sp steroids and remdesivir. Initial CXR w/ multifocal infiltrates. [place] home today w/o any O2 requirement.  2. Hyperkalemia - resolved 3. ESRD - home HD patient on HD 4x /wk as above. Had HD here on Thursday and Friday. For DC today. Will resume his dialysis tomorrow (Monday) at home.  4. HTN/ volume - lean body wt loss, set dry wt lower at dc 89kg. Gets norvasc 5/d at home only, same here.  5. 1/2 +blood cx's -from 5/12 for coag neg stapy. Repeat blood cx's sent from 5/13. We are asked to f/u these blood cx's to make sure they are negative through 12/28/20.  Will pass along to home training group.  6. Anemia ckd - Hb at goal, no need for esa 7. MBD ckd - cont sensipar, calcitriol and binder as at home 8. Dispo - going home today   Kelly Splinter 12/25/2020, 3:58 PM   Recent Labs  Lab 12/24/20 0153 12/25/20 0213  K 4.5 5.0  BUN 42* 77*  CREATININE 8.63* 11.72*  CALCIUM 8.9 9.1  PHOS 5.1* 4.5  HGB 9.7* 10.1*   Inpatient medications: . amLODipine  10 mg Oral QHS  .  vitamin C  500 mg Oral Daily  . aspirin  81 mg Oral Daily  . atorvastatin  80 mg Oral Daily  . calcitRIOL  0.25 mcg Oral Q M,W,F-1800  . calcium acetate  2,001 mg Oral TID WC  . Chlorhexidine Gluconate Cloth  6 each Topical Q0600  . cinacalcet  60 mg Oral QPM  . docusate sodium  100 mg Oral BID  . glycopyrrolate  2 mg Oral BID  . heparin  5,000 Units Subcutaneous Q8H  . insulin aspart  0-15 Units Subcutaneous TID WC  . insulin aspart  0-5 Units Subcutaneous QHS  . insulin aspart  4 Units Subcutaneous TID WC  . insulin glargine  32 Units Subcutaneous QHS  . levETIRAcetam  250 mg Oral QHS  . loratadine  10 mg Oral Daily  . multivitamin  1 tablet Oral Daily  . pantoprazole  40 mg Oral Daily  . predniSONE  40 mg Oral Q breakfast  . rOPINIRole  1 mg Oral QHS  . sodium chloride flush  3 mL Intravenous Q12H  . sodium chloride flush  3 mL Intravenous Q12H  . vancomycin variable dose per unstable renal function (pharmacist dosing)   Does not apply See admin instructions  .  zinc sulfate  220 mg Oral Daily   . sodium chloride    . sodium chloride    . sodium chloride    . remdesivir 100 mg in NS 100 mL 100 mg (12/25/20 0817)   sodium chloride, sodium chloride, sodium chloride, acetaminophen **OR** acetaminophen, albuterol, alteplase, bisacodyl, calcium carbonate (dosed in mg elemental calcium), camphor-menthol **AND** hydrOXYzine, chlorpheniramine-HYDROcodone, docusate sodium, feeding supplement (NEPRO CARB STEADY), gabapentin, guaiFENesin-dextromethorphan, heparin, heparin, hydrALAZINE, lidocaine (PF), lidocaine-prilocaine, ondansetron **OR** ondansetron (ZOFRAN) IV, oxyCODONE, pentafluoroprop-tetrafluoroeth, polyethylene glycol, sodium chloride flush, sorbitol, zolpidem

## 2020-12-25 NOTE — Progress Notes (Signed)
Patient was discharged from [id number]. He has all belongings. VS stable, on RA. IV d/c'd. AVS reviewed with patient and all questions have been answered. He will be taken to wife's car by nurse tech via wheelchair.

## 2020-12-26 DIAGNOSIS — Z992 Dependence on renal dialysis: Secondary | ICD-10-CM | POA: Diagnosis not present

## 2020-12-26 DIAGNOSIS — E875 Hyperkalemia: Secondary | ICD-10-CM | POA: Diagnosis not present

## 2020-12-26 DIAGNOSIS — N186 End stage renal disease: Secondary | ICD-10-CM | POA: Diagnosis not present

## 2020-12-26 DIAGNOSIS — N2581 Secondary hyperparathyroidism of renal origin: Secondary | ICD-10-CM | POA: Diagnosis not present

## 2020-12-27 ENCOUNTER — Encounter: Payer: Self-pay | Admitting: Internal Medicine

## 2020-12-27 ENCOUNTER — Telehealth: Payer: Self-pay

## 2020-12-27 DIAGNOSIS — N2581 Secondary hyperparathyroidism of renal origin: Secondary | ICD-10-CM | POA: Diagnosis not present

## 2020-12-27 DIAGNOSIS — Z992 Dependence on renal dialysis: Secondary | ICD-10-CM | POA: Diagnosis not present

## 2020-12-27 DIAGNOSIS — E875 Hyperkalemia: Secondary | ICD-10-CM | POA: Diagnosis not present

## 2020-12-27 DIAGNOSIS — N186 End stage renal disease: Secondary | ICD-10-CM | POA: Diagnosis not present

## 2020-12-27 LAB — CULTURE, BLOOD (ROUTINE X 2)
Culture: NO GROWTH
Special Requests: ADEQUATE

## 2020-12-27 NOTE — Telephone Encounter (Signed)
Transition Care Management Unsuccessful Follow-up Telephone Call  Date of discharge and from where:  12/25/2020 from [facility]  Attempts:  1st Attempt  Reason for unsuccessful TCM follow-up call:  Left voice message

## 2020-12-27 NOTE — Assessment & Plan Note (Signed)
Stable, cont current med tx - lantus, novolog

## 2020-12-27 NOTE — Patient Instructions (Signed)
Please take all new medication as prescribed 

## 2020-12-27 NOTE — Progress Notes (Signed)
Patient ID: Hector Mahoney, male   DOB: 1971-01-12, 50 y.o.   MRN: [id number]  Virtual Visit via Video Note  I connected with Hector Mahoney on Dec 20, 2020 at  1:20 PM EDT by a video enabled telemedicine application and verified that I am speaking with the correct person using two identifiers.  Location of all participants today Patient: at home Provider: at office   I discussed the limitations of evaluation and management by telemedicine and the availability of in person appointments. The patient expressed understanding and agreed to proceed.       HPI:  Hector Mahoney is a 50 y.o. male here after went to [facility] less than 1 wk ago with URI symptoms, covid neg, but now covid positive by home testing yesterday.  Has fever, loss of taste, congestion, fatigue and generalized weakness, nausea, mild non prod cough and sob but mild worse last pm and had to sleep somewhat propped up.  Symptoms start may 4.  Pt is vax x 2 with booster  X 1 ,  No hx of prior covid infection.  Last day worked may 6, asks for work note.   Pt denies polydipsia, polyuria, or new focal neuro s/s.     Past Medical History:  Diagnosis Date  . Anemia   . CHF (congestive heart failure) ([place])   . DIABETES MELLITUS, TYPE I 07/30/2007  . Esophageal reflux 04/27/2009  . ESRD (end stage renal disease) on dialysis ([place])    home HD 4x/week  . HYPERLIPIDEMIA 04/24/2007  . HYPERTENSION 04/24/2007   Past Surgical History:  Procedure Laterality Date  . AV FISTULA PLACEMENT Left 06/04/2019   Procedure: BRACHIAL-CEPHALIC ARTERIOVENOUS (AV) FISTULA CREATION LEFT ARM;  Surgeon: Rosetta Posner, MD;  Location: [facility] OR;  Service: Vascular;  Laterality: Left;  . COLONOSCOPY     polyp  . IR FLUORO GUIDE CV LINE RIGHT  10/22/2017  . IR US GUIDE VASC ACCESS RIGHT  10/22/2017  . MOUTH SURGERY     tooth ext    reports that he quit smoking about 5 years ago. His smoking use included cigarettes. He has never used smokeless tobacco. He  reports current alcohol use. He reports that he does not use drugs. family history includes Colon cancer in his maternal uncle; Colon polyps in his maternal uncle; Diabetes in his maternal aunt and mother; Heart disease in his maternal uncle; Hypertension in his mother; Lung cancer in his maternal grandfather. No Known Allergies Current Outpatient Medications on File Prior to Visit  Medication Sig Dispense Refill  . acetaminophen (TYLENOL) 500 MG tablet Take 500 mg by mouth every 6 (six) hours as needed for mild pain.    Marland Kitchen amLODipine (NORVASC) 5 MG tablet Take 1 tablet (5 mg total) by mouth at bedtime. 90 tablet 3  . aspirin EC 81 MG EC tablet Take 1 tablet (81 mg total) by mouth daily. 30 tablet 0  . atorvastatin (LIPITOR) 80 MG tablet Take 80 mg by mouth daily.    . calcitRIOL (ROCALTROL) 0.25 MCG capsule Take 1 capsule (0.25 mcg total) by mouth every Monday, Wednesday, and Friday at 6 PM. 15 capsule 0  . calcium acetate (PHOSLO) 667 MG capsule Take 3 capsules (2,001 mg total) by mouth 3 (three) times daily with meals. 90 capsule 11  . cinacalcet (SENSIPAR) 30 MG tablet Take 60 mg by mouth every evening.    . Continuous Blood Gluc Sensor (FREESTYLE LIBRE 14 DAY SENSOR) MISC 1 each by Other  route daily.    . fexofenadine (ALLEGRA) 180 MG tablet Take 1 tablet (180 mg total) by mouth daily. 90 tablet 3  . gabapentin (NEURONTIN) 300 MG capsule 1-2 tab by mouth at bedtime as needed (Patient taking differently: Take 300-600 mg by mouth at bedtime as needed (pain).) 180 capsule 1  . glycopyrrolate (ROBINUL) 2 MG tablet Take 1 tablet by mouth twice daily (Patient taking differently: Take 2 mg by mouth 2 (two) times daily.) 60 tablet 0  . heparin 1000 unit/mL SOLN injection 1,000 Units by Dialysis route one time in dialysis. On Dialysis days (4 times per week)    . insulin aspart (NOVOLOG) 100 UNIT/ML injection INJECT 8 TO 12 UNITS INTO THE SKIN THREE TIMES DAILY WITH MEALS (Patient taking differently:  Inject 8-12 Units into the skin 3 (three) times daily with meals.) 10 mL 0  . insulin glargine (LANTUS) 100 UNIT/ML injection Inject 0.25 mLs (25 Units total) into the skin at bedtime. 20 mL 5  . levETIRAcetam (KEPPRA) 250 MG tablet Take 1 tablet (250 mg total) by mouth at bedtime. 90 tablet 0  . meclizine (ANTIVERT) 12.5 MG tablet TAKE 1 TABLET BY MOUTH THREE TIMES DAILY AS NEEDED FOR DIZZINESS (Patient taking differently: Take 12.5 mg by mouth 3 (three) times daily as needed for dizziness.) 40 tablet 0  . multivitamin (RENA-VIT) TABS tablet Take 1 tablet by mouth daily.    Marland Kitchen omeprazole (PRILOSEC) 40 MG capsule Take 1 capsule (40 mg total) by mouth 2 (two) times daily. 60 capsule 11  . polyethylene glycol powder (GLYCOLAX/MIRALAX) 17 GM/SCOOP powder Take 1 Container by mouth daily as needed for mild constipation.    Marland Kitchen rOPINIRole (REQUIP) 1 MG tablet Take 1 mg by mouth at bedtime.    . rosuvastatin (CRESTOR) 10 MG tablet Take 1 tablet (10 mg total) by mouth daily. 30 tablet 3  . triamcinolone (NASACORT) 55 MCG/ACT AERO nasal inhaler Place 2 sprays into the nose daily. (Patient taking differently: Place 2 sprays into the nose daily as needed (allergies).) 1 each 0   No current facility-administered medications on file prior to visit.   Observations/Objective: Alert, NAD, appropriate mood and affect, resps normal, cn 2-12 intact, moves all 4s, no visible rash or swelling Lab Results  Component Value Date   WBC 16.2 (H) 12/25/2020   HGB 10.1 (L) 12/25/2020   HCT 30.6 (L) 12/25/2020   PLT 380 12/25/2020   GLUCOSE 341 (H) 12/25/2020   CHOL 183 06/25/2019   TRIG 214.0 (H) 06/25/2019   HDL 58.40 06/25/2019   LDLDIRECT 80.0 06/25/2019   ALT 23 12/25/2020   AST 15 12/25/2020   NA 133 (L) 12/25/2020   K 5.0 12/25/2020   CL 89 (L) 12/25/2020   CREATININE 11.72 (H) 12/25/2020   BUN 77 (H) 12/25/2020   CO2 24 12/25/2020   TSH 2.03 06/25/2019   PSA 1.39 06/25/2019   INR 1.05 10/21/2017    HGBA1C 7.3 (H) 12/22/2020   Assessment and Plan: See notes  Follow Up Instructions: See notes   I discussed the assessment and treatment plan with the patient. The patient was provided an opportunity to ask questions and all were answered. The patient agreed with the plan and demonstrated an understanding of the instructions.   The patient was advised to call back or seek an in-person evaluation if the symptoms worsen or if the condition fails to improve as anticipated.  Cathlean Cower, MD

## 2020-12-27 NOTE — Telephone Encounter (Signed)
Transition Care Management Unsuccessful Follow-up Telephone Call  Date of discharge and from where:  12/25/2020 from [facility]  Attempts:  2nd Attempt  Reason for unsuccessful TCM follow-up call:  Unable to leave message

## 2020-12-27 NOTE — Assessment & Plan Note (Signed)
With at least mod symptoms, for albuterol hfa prn, cough med prn, zofran prn, and refer to covid care for possible paxlovid or similar

## 2020-12-28 ENCOUNTER — Telehealth: Payer: Self-pay

## 2020-12-28 LAB — CULTURE, BLOOD (ROUTINE X 2)
Culture: NO GROWTH
Culture: NO GROWTH
Special Requests: ADEQUATE
Special Requests: ADEQUATE

## 2020-12-28 NOTE — Telephone Encounter (Signed)
Transition Care Management Unsuccessful Follow-up Telephone Call  Date of discharge and from where:  12/25/2020 from [facility]  Attempts:  3rd Attempt  Reason for unsuccessful TCM follow-up call:  Left voice message

## 2020-12-29 DIAGNOSIS — N2581 Secondary hyperparathyroidism of renal origin: Secondary | ICD-10-CM | POA: Diagnosis not present

## 2020-12-29 DIAGNOSIS — E875 Hyperkalemia: Secondary | ICD-10-CM | POA: Diagnosis not present

## 2020-12-29 DIAGNOSIS — N186 End stage renal disease: Secondary | ICD-10-CM | POA: Diagnosis not present

## 2020-12-29 DIAGNOSIS — Z992 Dependence on renal dialysis: Secondary | ICD-10-CM | POA: Diagnosis not present

## 2020-12-30 DIAGNOSIS — Z992 Dependence on renal dialysis: Secondary | ICD-10-CM | POA: Diagnosis not present

## 2020-12-30 DIAGNOSIS — N186 End stage renal disease: Secondary | ICD-10-CM | POA: Diagnosis not present

## 2020-12-30 DIAGNOSIS — N2581 Secondary hyperparathyroidism of renal origin: Secondary | ICD-10-CM | POA: Diagnosis not present

## 2020-12-30 DIAGNOSIS — E875 Hyperkalemia: Secondary | ICD-10-CM | POA: Diagnosis not present

## 2021-01-02 DIAGNOSIS — N186 End stage renal disease: Secondary | ICD-10-CM | POA: Diagnosis not present

## 2021-01-02 DIAGNOSIS — Z992 Dependence on renal dialysis: Secondary | ICD-10-CM | POA: Diagnosis not present

## 2021-01-02 DIAGNOSIS — E875 Hyperkalemia: Secondary | ICD-10-CM | POA: Diagnosis not present

## 2021-01-02 DIAGNOSIS — N2581 Secondary hyperparathyroidism of renal origin: Secondary | ICD-10-CM | POA: Diagnosis not present

## 2021-01-03 DIAGNOSIS — E875 Hyperkalemia: Secondary | ICD-10-CM | POA: Diagnosis not present

## 2021-01-03 DIAGNOSIS — N2581 Secondary hyperparathyroidism of renal origin: Secondary | ICD-10-CM | POA: Diagnosis not present

## 2021-01-03 DIAGNOSIS — Z992 Dependence on renal dialysis: Secondary | ICD-10-CM | POA: Diagnosis not present

## 2021-01-03 DIAGNOSIS — N186 End stage renal disease: Secondary | ICD-10-CM | POA: Diagnosis not present

## 2021-01-04 DIAGNOSIS — N186 End stage renal disease: Secondary | ICD-10-CM | POA: Diagnosis not present

## 2021-01-04 DIAGNOSIS — E875 Hyperkalemia: Secondary | ICD-10-CM | POA: Diagnosis not present

## 2021-01-04 DIAGNOSIS — N2581 Secondary hyperparathyroidism of renal origin: Secondary | ICD-10-CM | POA: Diagnosis not present

## 2021-01-04 DIAGNOSIS — Z992 Dependence on renal dialysis: Secondary | ICD-10-CM | POA: Diagnosis not present

## 2021-01-06 ENCOUNTER — Inpatient Hospital Stay: Payer: Federal, State, Local not specified - PPO | Admitting: Internal Medicine

## 2021-01-09 DIAGNOSIS — N2581 Secondary hyperparathyroidism of renal origin: Secondary | ICD-10-CM | POA: Diagnosis not present

## 2021-01-09 DIAGNOSIS — Z992 Dependence on renal dialysis: Secondary | ICD-10-CM | POA: Diagnosis not present

## 2021-01-09 DIAGNOSIS — E875 Hyperkalemia: Secondary | ICD-10-CM | POA: Diagnosis not present

## 2021-01-09 DIAGNOSIS — N186 End stage renal disease: Secondary | ICD-10-CM | POA: Diagnosis not present

## 2021-01-10 DIAGNOSIS — N186 End stage renal disease: Secondary | ICD-10-CM | POA: Diagnosis not present

## 2021-01-10 DIAGNOSIS — Z992 Dependence on renal dialysis: Secondary | ICD-10-CM | POA: Diagnosis not present

## 2021-01-10 DIAGNOSIS — E1022 Type 1 diabetes mellitus with diabetic chronic kidney disease: Secondary | ICD-10-CM | POA: Diagnosis not present

## 2021-01-10 DIAGNOSIS — E875 Hyperkalemia: Secondary | ICD-10-CM | POA: Diagnosis not present

## 2021-01-10 DIAGNOSIS — N2581 Secondary hyperparathyroidism of renal origin: Secondary | ICD-10-CM | POA: Diagnosis not present

## 2021-01-12 DIAGNOSIS — Z992 Dependence on renal dialysis: Secondary | ICD-10-CM | POA: Diagnosis not present

## 2021-01-12 DIAGNOSIS — E875 Hyperkalemia: Secondary | ICD-10-CM | POA: Diagnosis not present

## 2021-01-12 DIAGNOSIS — N2581 Secondary hyperparathyroidism of renal origin: Secondary | ICD-10-CM | POA: Diagnosis not present

## 2021-01-12 DIAGNOSIS — N186 End stage renal disease: Secondary | ICD-10-CM | POA: Diagnosis not present

## 2021-01-13 DIAGNOSIS — E875 Hyperkalemia: Secondary | ICD-10-CM | POA: Diagnosis not present

## 2021-01-13 DIAGNOSIS — Z992 Dependence on renal dialysis: Secondary | ICD-10-CM | POA: Diagnosis not present

## 2021-01-13 DIAGNOSIS — N186 End stage renal disease: Secondary | ICD-10-CM | POA: Diagnosis not present

## 2021-01-13 DIAGNOSIS — N2581 Secondary hyperparathyroidism of renal origin: Secondary | ICD-10-CM | POA: Diagnosis not present

## 2021-01-16 ENCOUNTER — Ambulatory Visit: Payer: Federal, State, Local not specified - PPO | Admitting: Podiatry

## 2021-01-16 DIAGNOSIS — N186 End stage renal disease: Secondary | ICD-10-CM | POA: Diagnosis not present

## 2021-01-16 DIAGNOSIS — Z992 Dependence on renal dialysis: Secondary | ICD-10-CM | POA: Diagnosis not present

## 2021-01-16 DIAGNOSIS — E875 Hyperkalemia: Secondary | ICD-10-CM | POA: Diagnosis not present

## 2021-01-16 DIAGNOSIS — N2581 Secondary hyperparathyroidism of renal origin: Secondary | ICD-10-CM | POA: Diagnosis not present

## 2021-01-17 ENCOUNTER — Other Ambulatory Visit: Payer: Self-pay | Admitting: Internal Medicine

## 2021-01-17 DIAGNOSIS — Z992 Dependence on renal dialysis: Secondary | ICD-10-CM | POA: Diagnosis not present

## 2021-01-17 DIAGNOSIS — N186 End stage renal disease: Secondary | ICD-10-CM | POA: Diagnosis not present

## 2021-01-17 DIAGNOSIS — E875 Hyperkalemia: Secondary | ICD-10-CM | POA: Diagnosis not present

## 2021-01-17 DIAGNOSIS — N2581 Secondary hyperparathyroidism of renal origin: Secondary | ICD-10-CM | POA: Diagnosis not present

## 2021-01-19 DIAGNOSIS — N2581 Secondary hyperparathyroidism of renal origin: Secondary | ICD-10-CM | POA: Diagnosis not present

## 2021-01-19 DIAGNOSIS — Z992 Dependence on renal dialysis: Secondary | ICD-10-CM | POA: Diagnosis not present

## 2021-01-19 DIAGNOSIS — N186 End stage renal disease: Secondary | ICD-10-CM | POA: Diagnosis not present

## 2021-01-19 DIAGNOSIS — E875 Hyperkalemia: Secondary | ICD-10-CM | POA: Diagnosis not present

## 2021-01-20 DIAGNOSIS — N186 End stage renal disease: Secondary | ICD-10-CM | POA: Diagnosis not present

## 2021-01-20 DIAGNOSIS — Z992 Dependence on renal dialysis: Secondary | ICD-10-CM | POA: Diagnosis not present

## 2021-01-20 DIAGNOSIS — N2581 Secondary hyperparathyroidism of renal origin: Secondary | ICD-10-CM | POA: Diagnosis not present

## 2021-01-20 DIAGNOSIS — E875 Hyperkalemia: Secondary | ICD-10-CM | POA: Diagnosis not present

## 2021-01-22 ENCOUNTER — Other Ambulatory Visit: Payer: Self-pay | Admitting: Gastroenterology

## 2021-01-23 DIAGNOSIS — E875 Hyperkalemia: Secondary | ICD-10-CM | POA: Diagnosis not present

## 2021-01-23 DIAGNOSIS — Z992 Dependence on renal dialysis: Secondary | ICD-10-CM | POA: Diagnosis not present

## 2021-01-23 DIAGNOSIS — N186 End stage renal disease: Secondary | ICD-10-CM | POA: Diagnosis not present

## 2021-01-23 DIAGNOSIS — N2581 Secondary hyperparathyroidism of renal origin: Secondary | ICD-10-CM | POA: Diagnosis not present

## 2021-01-24 DIAGNOSIS — N186 End stage renal disease: Secondary | ICD-10-CM | POA: Diagnosis not present

## 2021-01-24 DIAGNOSIS — Z992 Dependence on renal dialysis: Secondary | ICD-10-CM | POA: Diagnosis not present

## 2021-01-24 DIAGNOSIS — E875 Hyperkalemia: Secondary | ICD-10-CM | POA: Diagnosis not present

## 2021-01-24 DIAGNOSIS — N2581 Secondary hyperparathyroidism of renal origin: Secondary | ICD-10-CM | POA: Diagnosis not present

## 2021-01-27 DIAGNOSIS — E875 Hyperkalemia: Secondary | ICD-10-CM | POA: Diagnosis not present

## 2021-01-27 DIAGNOSIS — Z992 Dependence on renal dialysis: Secondary | ICD-10-CM | POA: Diagnosis not present

## 2021-01-27 DIAGNOSIS — R0602 Shortness of breath: Secondary | ICD-10-CM | POA: Diagnosis not present

## 2021-01-27 DIAGNOSIS — Z20822 Contact with and (suspected) exposure to covid-19: Secondary | ICD-10-CM | POA: Diagnosis not present

## 2021-01-27 DIAGNOSIS — N186 End stage renal disease: Secondary | ICD-10-CM | POA: Diagnosis not present

## 2021-01-27 DIAGNOSIS — N2581 Secondary hyperparathyroidism of renal origin: Secondary | ICD-10-CM | POA: Diagnosis not present

## 2021-01-28 DIAGNOSIS — N2581 Secondary hyperparathyroidism of renal origin: Secondary | ICD-10-CM | POA: Diagnosis not present

## 2021-01-28 DIAGNOSIS — E875 Hyperkalemia: Secondary | ICD-10-CM | POA: Diagnosis not present

## 2021-01-28 DIAGNOSIS — N186 End stage renal disease: Secondary | ICD-10-CM | POA: Diagnosis not present

## 2021-01-28 DIAGNOSIS — Z992 Dependence on renal dialysis: Secondary | ICD-10-CM | POA: Diagnosis not present

## 2021-01-30 DIAGNOSIS — N186 End stage renal disease: Secondary | ICD-10-CM | POA: Diagnosis not present

## 2021-01-30 DIAGNOSIS — Z992 Dependence on renal dialysis: Secondary | ICD-10-CM | POA: Diagnosis not present

## 2021-01-30 DIAGNOSIS — N2581 Secondary hyperparathyroidism of renal origin: Secondary | ICD-10-CM | POA: Diagnosis not present

## 2021-01-30 DIAGNOSIS — E875 Hyperkalemia: Secondary | ICD-10-CM | POA: Diagnosis not present

## 2021-01-31 DIAGNOSIS — E875 Hyperkalemia: Secondary | ICD-10-CM | POA: Diagnosis not present

## 2021-01-31 DIAGNOSIS — Z992 Dependence on renal dialysis: Secondary | ICD-10-CM | POA: Diagnosis not present

## 2021-01-31 DIAGNOSIS — N2581 Secondary hyperparathyroidism of renal origin: Secondary | ICD-10-CM | POA: Diagnosis not present

## 2021-01-31 DIAGNOSIS — N186 End stage renal disease: Secondary | ICD-10-CM | POA: Diagnosis not present

## 2021-02-01 DIAGNOSIS — E103513 Type 1 diabetes mellitus with proliferative diabetic retinopathy with macular edema, bilateral: Secondary | ICD-10-CM | POA: Diagnosis not present

## 2021-02-02 DIAGNOSIS — E875 Hyperkalemia: Secondary | ICD-10-CM | POA: Diagnosis not present

## 2021-02-02 DIAGNOSIS — Z992 Dependence on renal dialysis: Secondary | ICD-10-CM | POA: Diagnosis not present

## 2021-02-02 DIAGNOSIS — N186 End stage renal disease: Secondary | ICD-10-CM | POA: Diagnosis not present

## 2021-02-02 DIAGNOSIS — N2581 Secondary hyperparathyroidism of renal origin: Secondary | ICD-10-CM | POA: Diagnosis not present

## 2021-02-03 DIAGNOSIS — E875 Hyperkalemia: Secondary | ICD-10-CM | POA: Diagnosis not present

## 2021-02-03 DIAGNOSIS — Z992 Dependence on renal dialysis: Secondary | ICD-10-CM | POA: Diagnosis not present

## 2021-02-03 DIAGNOSIS — N186 End stage renal disease: Secondary | ICD-10-CM | POA: Diagnosis not present

## 2021-02-03 DIAGNOSIS — N2581 Secondary hyperparathyroidism of renal origin: Secondary | ICD-10-CM | POA: Diagnosis not present

## 2021-02-06 DIAGNOSIS — E875 Hyperkalemia: Secondary | ICD-10-CM | POA: Diagnosis not present

## 2021-02-06 DIAGNOSIS — N186 End stage renal disease: Secondary | ICD-10-CM | POA: Diagnosis not present

## 2021-02-06 DIAGNOSIS — Z992 Dependence on renal dialysis: Secondary | ICD-10-CM | POA: Diagnosis not present

## 2021-02-06 DIAGNOSIS — N2581 Secondary hyperparathyroidism of renal origin: Secondary | ICD-10-CM | POA: Diagnosis not present

## 2021-02-07 DIAGNOSIS — Z992 Dependence on renal dialysis: Secondary | ICD-10-CM | POA: Diagnosis not present

## 2021-02-07 DIAGNOSIS — N2581 Secondary hyperparathyroidism of renal origin: Secondary | ICD-10-CM | POA: Diagnosis not present

## 2021-02-07 DIAGNOSIS — E875 Hyperkalemia: Secondary | ICD-10-CM | POA: Diagnosis not present

## 2021-02-07 DIAGNOSIS — N186 End stage renal disease: Secondary | ICD-10-CM | POA: Diagnosis not present

## 2021-02-09 DIAGNOSIS — Z992 Dependence on renal dialysis: Secondary | ICD-10-CM | POA: Diagnosis not present

## 2021-02-09 DIAGNOSIS — N186 End stage renal disease: Secondary | ICD-10-CM | POA: Diagnosis not present

## 2021-02-09 DIAGNOSIS — E875 Hyperkalemia: Secondary | ICD-10-CM | POA: Diagnosis not present

## 2021-02-09 DIAGNOSIS — N2581 Secondary hyperparathyroidism of renal origin: Secondary | ICD-10-CM | POA: Diagnosis not present

## 2021-02-09 DIAGNOSIS — E1022 Type 1 diabetes mellitus with diabetic chronic kidney disease: Secondary | ICD-10-CM | POA: Diagnosis not present

## 2021-02-10 DIAGNOSIS — N186 End stage renal disease: Secondary | ICD-10-CM | POA: Diagnosis not present

## 2021-02-10 DIAGNOSIS — N2581 Secondary hyperparathyroidism of renal origin: Secondary | ICD-10-CM | POA: Diagnosis not present

## 2021-02-10 DIAGNOSIS — Z992 Dependence on renal dialysis: Secondary | ICD-10-CM | POA: Diagnosis not present

## 2021-02-10 DIAGNOSIS — E875 Hyperkalemia: Secondary | ICD-10-CM | POA: Diagnosis not present

## 2021-02-13 DIAGNOSIS — Z992 Dependence on renal dialysis: Secondary | ICD-10-CM | POA: Diagnosis not present

## 2021-02-13 DIAGNOSIS — N186 End stage renal disease: Secondary | ICD-10-CM | POA: Diagnosis not present

## 2021-02-13 DIAGNOSIS — N2581 Secondary hyperparathyroidism of renal origin: Secondary | ICD-10-CM | POA: Diagnosis not present

## 2021-02-13 DIAGNOSIS — E875 Hyperkalemia: Secondary | ICD-10-CM | POA: Diagnosis not present

## 2021-02-14 ENCOUNTER — Ambulatory Visit (INDEPENDENT_AMBULATORY_CARE_PROVIDER_SITE_OTHER): Payer: Federal, State, Local not specified - PPO | Admitting: Emergency Medicine

## 2021-02-14 ENCOUNTER — Telehealth: Payer: Self-pay | Admitting: Internal Medicine

## 2021-02-14 ENCOUNTER — Encounter (HOSPITAL_COMMUNITY): Payer: Self-pay

## 2021-02-14 ENCOUNTER — Emergency Department (HOSPITAL_COMMUNITY): Payer: No Typology Code available for payment source

## 2021-02-14 ENCOUNTER — Emergency Department (HOSPITAL_COMMUNITY)
Admission: EM | Admit: 2021-02-14 | Discharge: 2021-02-14 | Disposition: A | Discharge status: Home / Self Care | Payer: No Typology Code available for payment source | Attending: Emergency Medicine | Admitting: Emergency Medicine

## 2021-02-14 ENCOUNTER — Encounter: Payer: Self-pay | Admitting: Emergency Medicine

## 2021-02-14 ENCOUNTER — Other Ambulatory Visit: Payer: Self-pay

## 2021-02-14 VITALS — BP 152/88 | HR 100 | Temp 97.9°F | Ht 66.0 in | Wt 207.0 lb

## 2021-02-14 DIAGNOSIS — E1022 Type 1 diabetes mellitus with diabetic chronic kidney disease: Secondary | ICD-10-CM | POA: Diagnosis not present

## 2021-02-14 DIAGNOSIS — E8779 Other fluid overload: Secondary | ICD-10-CM

## 2021-02-14 DIAGNOSIS — I5033 Acute on chronic diastolic (congestive) heart failure: Secondary | ICD-10-CM | POA: Insufficient documentation

## 2021-02-14 DIAGNOSIS — N185 Chronic kidney disease, stage 5: Secondary | ICD-10-CM

## 2021-02-14 DIAGNOSIS — Z794 Long term (current) use of insulin: Secondary | ICD-10-CM | POA: Insufficient documentation

## 2021-02-14 DIAGNOSIS — E785 Hyperlipidemia, unspecified: Secondary | ICD-10-CM

## 2021-02-14 DIAGNOSIS — R0602 Shortness of breath: Secondary | ICD-10-CM | POA: Diagnosis not present

## 2021-02-14 DIAGNOSIS — Z20822 Contact with and (suspected) exposure to covid-19: Secondary | ICD-10-CM | POA: Insufficient documentation

## 2021-02-14 DIAGNOSIS — R9431 Abnormal electrocardiogram [ECG] [EKG]: Secondary | ICD-10-CM

## 2021-02-14 DIAGNOSIS — Z992 Dependence on renal dialysis: Secondary | ICD-10-CM | POA: Diagnosis not present

## 2021-02-14 DIAGNOSIS — N186 End stage renal disease: Secondary | ICD-10-CM | POA: Diagnosis not present

## 2021-02-14 DIAGNOSIS — E875 Hyperkalemia: Secondary | ICD-10-CM | POA: Diagnosis not present

## 2021-02-14 DIAGNOSIS — R079 Chest pain, unspecified: Secondary | ICD-10-CM | POA: Diagnosis not present

## 2021-02-14 DIAGNOSIS — I132 Hypertensive heart and chronic kidney disease with heart failure and with stage 5 chronic kidney disease, or end stage renal disease: Secondary | ICD-10-CM | POA: Diagnosis not present

## 2021-02-14 DIAGNOSIS — E1169 Type 2 diabetes mellitus with other specified complication: Secondary | ICD-10-CM

## 2021-02-14 DIAGNOSIS — Z87891 Personal history of nicotine dependence: Secondary | ICD-10-CM | POA: Diagnosis not present

## 2021-02-14 DIAGNOSIS — J9811 Atelectasis: Secondary | ICD-10-CM | POA: Diagnosis not present

## 2021-02-14 DIAGNOSIS — I10 Essential (primary) hypertension: Secondary | ICD-10-CM | POA: Diagnosis not present

## 2021-02-14 DIAGNOSIS — Z8616 Personal history of COVID-19: Secondary | ICD-10-CM | POA: Diagnosis not present

## 2021-02-14 DIAGNOSIS — I152 Hypertension secondary to endocrine disorders: Secondary | ICD-10-CM

## 2021-02-14 DIAGNOSIS — J80 Acute respiratory distress syndrome: Secondary | ICD-10-CM | POA: Diagnosis not present

## 2021-02-14 DIAGNOSIS — I517 Cardiomegaly: Secondary | ICD-10-CM

## 2021-02-14 DIAGNOSIS — N2581 Secondary hyperparathyroidism of renal origin: Secondary | ICD-10-CM | POA: Diagnosis not present

## 2021-02-14 DIAGNOSIS — R0902 Hypoxemia: Secondary | ICD-10-CM | POA: Diagnosis not present

## 2021-02-14 DIAGNOSIS — E1159 Type 2 diabetes mellitus with other circulatory complications: Secondary | ICD-10-CM

## 2021-02-14 LAB — BASIC METABOLIC PANEL
Anion gap: 18 — ABNORMAL HIGH (ref 5–15)
BUN: 122 mg/dL — ABNORMAL HIGH (ref 6–20)
CO2: 22 mmol/L (ref 22–32)
Calcium: 9.2 mg/dL (ref 8.9–10.3)
Chloride: 93 mmol/L — ABNORMAL LOW (ref 98–111)
Creatinine, Ser: 18.42 mg/dL — ABNORMAL HIGH (ref 0.61–1.24)
GFR, Estimated: 3 mL/min — ABNORMAL LOW (ref >60)
Glucose, Bld: 130 mg/dL — ABNORMAL HIGH (ref 70–99)
Potassium: 6.7 mmol/L (ref 3.5–5.1)
Sodium: 133 mmol/L — ABNORMAL LOW (ref 135–145)

## 2021-02-14 LAB — CBC
HCT: 37.4 % — ABNORMAL LOW (ref 39.0–52.0)
Hemoglobin: 11.8 g/dL — ABNORMAL LOW (ref 13.0–17.0)
MCH: 26.1 pg (ref 26.0–34.0)
MCHC: 31.6 g/dL (ref 30.0–36.0)
MCV: 82.7 fL (ref 80.0–100.0)
Platelets: 267 10*3/uL (ref 150–400)
RBC: 4.52 MIL/uL (ref 4.22–5.81)
RDW: 15.6 % — ABNORMAL HIGH (ref 11.5–15.5)
WBC: 11.3 10*3/uL — ABNORMAL HIGH (ref 4.0–10.5)
nRBC: 0 % (ref 0.0–0.2)

## 2021-02-14 LAB — RESP PANEL BY RT-PCR (FLU A&B, COVID) ARPGX2
Influenza A by PCR: NEGATIVE
Influenza B by PCR: NEGATIVE
SARS Coronavirus 2 by RT PCR: NEGATIVE

## 2021-02-14 LAB — TROPONIN I (HIGH SENSITIVITY): Troponin I (High Sensitivity): 55 ng/L — ABNORMAL HIGH (ref <18)

## 2021-02-14 LAB — BRAIN NATRIURETIC PEPTIDE: B Natriuretic Peptide: 446.6 pg/mL — ABNORMAL HIGH (ref 0.0–100.0)

## 2021-02-14 MED ORDER — SODIUM CHLORIDE 0.9 % IV SOLN
100.0000 mL | INTRAVENOUS | Status: DC | PRN
Start: 2021-02-14 — End: 2021-02-15

## 2021-02-14 MED ORDER — CHLORHEXIDINE GLUCONATE CLOTH 2 % EX PADS: 6.0000 | MEDICATED_PAD | Freq: Every day | CUTANEOUS | Status: DC

## 2021-02-14 MED ORDER — SODIUM CHLORIDE 0.9 % IV SOLN: 100.0000 mL | INTRAVENOUS | Status: DC | PRN

## 2021-02-14 MED ORDER — HEPARIN SODIUM (PORCINE) 1000 UNIT/ML DIALYSIS
1000.0000 [IU] | INTRAMUSCULAR | Status: DC | PRN
  Filled 2021-02-14: qty 1

## 2021-02-14 MED ORDER — PENTAFLUOROPROP-TETRAFLUOROETH EX AERO
1.0000 "application " | INHALATION_SPRAY | CUTANEOUS | Status: DC | PRN
  Filled 2021-02-14: qty 116

## 2021-02-14 MED ORDER — LIDOCAINE HCL (PF) 1 % IJ SOLN: 5.0000 mL | INTRAMUSCULAR | Status: DC | PRN

## 2021-02-14 MED ORDER — LIDOCAINE-PRILOCAINE 2.5-2.5 % EX CREA
1.0000 "application " | TOPICAL_CREAM | CUTANEOUS | Status: DC | PRN
  Filled 2021-02-14: qty 5

## 2021-02-14 NOTE — Telephone Encounter (Signed)
   Patient called and said that he has been having SOB for a few days now. He said that it comes and goes. He said that it is more frequent when laying down at night. Transferred to team health.

## 2021-02-14 NOTE — ED Notes (Signed)
Pt transported to HD.

## 2021-02-14 NOTE — Progress Notes (Signed)
Hector Mahoney 50 y.o.   Chief Complaint  Patient presents with   Shortness of Breath    Pt states he has been feeling SOB, x 3 days    HISTORY OF PRESENT ILLNESS: This is a 50 y.o. male complaining of shortness of breath for the past 3 days. Mostly dyspnea on exertion with some occasional chest tightness. Dialysis diabetic patient.  Denies fever or any other associated symptoms. Does dialysis at home but has been unable to remove sufficient fluid the last few days. Paramedics were called to the house today at 1 in the morning but patient refused to go to the emergency department.  Shortness of Breath Associated symptoms include chest pain (Chest tightness). Pertinent negatives include no abdominal pain, fever, headaches, hemoptysis, rash, sore throat or vomiting.    Prior to Admission medications   Medication Sig Start Date End Date Taking? Authorizing Provider  acetaminophen (TYLENOL) 500 MG tablet Take 500 mg by mouth every 6 (six) hours as needed for mild pain. 05/06/20 05/10/21 Yes [provider]  albuterol (VENTOLIN HFA) 108 (90 Base) MCG/ACT inhaler Inhale 2 puffs into the lungs every 6 (six) hours as needed for wheezing or shortness of breath. 12/20/20  Yes Biagio Borg, MD  amLODipine (NORVASC) 5 MG tablet Take 1 tablet (5 mg total) by mouth at bedtime. 11/01/20  Yes Biagio Borg, MD  aspirin EC 81 MG EC tablet Take 1 tablet (81 mg total) by mouth daily. 09/06/15  Yes Nita Sells, MD  atorvastatin (LIPITOR) 80 MG tablet Take 80 mg by mouth daily. 02/26/18  Yes [provider]  calcitRIOL (ROCALTROL) 0.25 MCG capsule Take 1 capsule (0.25 mcg total) by mouth every Monday, Wednesday, and Friday at 6 PM. 09/30/17  Yes Thurnell Lose, MD  calcium acetate (PHOSLO) 667 MG capsule Take 3 capsules (2,001 mg total) by mouth 3 (three) times daily with meals. 11/01/20  Yes Biagio Borg, MD  cinacalcet (SENSIPAR) 30 MG tablet Take 60 mg by mouth every evening.    Yes [provider]  Continuous Blood Gluc Sensor (FREESTYLE LIBRE 14 DAY SENSOR) MISC 1 each by Other route daily. 07/22/20  Yes [provider]  fexofenadine (ALLEGRA) 180 MG tablet Take 1 tablet (180 mg total) by mouth daily. 03/17/20 03/17/21 Yes Biagio Borg, MD  gabapentin (NEURONTIN) 300 MG capsule TAKE 1 TO 2 CAPSULES BY MOUTH AT BEDTIME AS NEEDED 01/17/21  Yes Biagio Borg, MD  glycopyrrolate (ROBINUL) 2 MG tablet Take 1 tablet by mouth twice daily 01/23/21  Yes Ladene Artist, MD  heparin 1000 unit/mL SOLN injection 1,000 Units by Dialysis route one time in dialysis. On Dialysis days (4 times per week) 05/27/20 05/26/21 Yes [provider]  insulin aspart (NOVOLOG) 100 UNIT/ML injection INJECT 8 TO 12 UNITS INTO THE SKIN THREE TIMES DAILY WITH MEALS Patient taking differently: Inject 8-12 Units into the skin 3 (three) times daily with meals. 10/10/20  Yes Biagio Borg, MD  insulin glargine (LANTUS) 100 UNIT/ML injection Inject 0.25 mLs (25 Units total) into the skin at bedtime. 10/25/17  Yes Rai, Ripudeep K, MD  levETIRAcetam (KEPPRA) 250 MG tablet Take 1 tablet (250 mg total) by mouth at bedtime. 12/16/20  Yes Patel, Donika K, DO  meclizine (ANTIVERT) 12.5 MG tablet TAKE 1 TABLET BY MOUTH THREE TIMES DAILY AS NEEDED FOR DIZZINESS Patient taking differently: Take 12.5 mg by mouth 3 (three) times daily as needed for dizziness. 05/28/20  Yes Cathlean Cower  W, MD  multivitamin (RENA-VIT) TABS tablet Take 1 tablet by mouth daily. 03/20/19  Yes [provider]  omeprazole (PRILOSEC) 40 MG capsule Take 1 capsule (40 mg total) by mouth 2 (two) times daily. 11/26/19  Yes Ladene Artist, MD  ondansetron (ZOFRAN) 4 MG tablet Take 1 tablet (4 mg total) by mouth every 8 (eight) hours as needed for nausea or vomiting. 12/20/20  Yes Biagio Borg, MD  polyethylene glycol powder (GLYCOLAX/MIRALAX) 17 GM/SCOOP powder Take 1 Container by mouth daily as needed for mild constipation.  10/25/17  Yes [provider]  predniSONE (DELTASONE) 10 MG tablet Take 40 mg daily for 1 day, 30 mg daily for 1 day, 20 mg daily for 1 days,10 mg daily for 1 day, then stop 12/25/20  Yes Ghimire, Henreitta Leber, MD  rOPINIRole (REQUIP) 1 MG tablet Take 1 mg by mouth at bedtime. 08/12/19  Yes [provider]  rosuvastatin (CRESTOR) 10 MG tablet Take 1 tablet (10 mg total) by mouth daily. 10/25/17  Yes Rai, Ripudeep K, MD  triamcinolone (NASACORT) 55 MCG/ACT AERO nasal inhaler Place 2 sprays into the nose daily. Patient taking differently: Place 2 sprays into the nose daily as needed (allergies). 05/21/20  Yes McGowen, Adrian Blackwater, MD    No Known Allergies  Patient Active Problem List   Diagnosis Date Noted   COVID-19 12/22/2020   RLS (restless legs syndrome) 08/11/2019   Iron deficiency anemia 06/25/2019   Allergic rhinitis 05/31/2019   Acute dysfunction of right eustachian tube 05/31/2019   Vertigo 05/31/2019   ESRD on hemodialysis ([place]) 05/31/2019   Abdominal pain 03/26/2018   Nausea 03/26/2018   Diarrhea 03/26/2018   Acute renal failure superimposed on stage 5 chronic kidney disease, not on chronic dialysis ([place]) 10/20/2017   Hyperkalemia 10/20/2017   CKD stage 5 due to type 1 diabetes mellitus ([place]) 09/27/2017   Low back pain 03/29/2017   Nocturnal leg cramps 04/03/2016   Hypertensive urgency 09/02/2015   Acute on chronic diastolic heart failure ([place]) 09/02/2015   Patient nonadherence 09/02/2015   Dyspnea 12/22/2012   Insulin dependent diabetes mellitus with complications 13/03/6577   Preventative health care 04/01/2011   Esophageal reflux 04/27/2009   INSOMNIA-SLEEP DISORDER-UNSPEC 04/27/2009   Type 1 diabetes mellitus ([place]) 07/30/2007   Dyslipidemia 04/24/2007   Essential hypertension 04/24/2007    Past Medical History:  Diagnosis Date   Anemia    CHF (congestive heart failure) ([place])    DIABETES MELLITUS, TYPE I 07/30/2007   Esophageal reflux 04/27/2009   ESRD  (end stage renal disease) on dialysis [facility])    home HD 4x/week   HYPERLIPIDEMIA 04/24/2007   HYPERTENSION 04/24/2007    Past Surgical History:  Procedure Laterality Date   AV FISTULA PLACEMENT Left 06/04/2019   Procedure: BRACHIAL-CEPHALIC ARTERIOVENOUS (AV) FISTULA CREATION LEFT ARM;  Surgeon: Rosetta Posner, MD;  Location: [facility] OR;  Service: Vascular;  Laterality: Left;   COLONOSCOPY     polyp   IR FLUORO GUIDE CV LINE RIGHT  10/22/2017   IR US GUIDE VASC ACCESS RIGHT  10/22/2017   MOUTH SURGERY     tooth ext    Social History   Socioeconomic History   Marital status: Married    Spouse name: Not on file   Number of children: 0   Years of education: Not on file   Highest education level: Not on file  Occupational History   Not on file  Tobacco Use   Smoking status: Former  Pack years: 0.00    Types: Cigarettes    Quit date: 05/03/2015    Years since quitting: 5.7   Smokeless tobacco: Never   Tobacco comments:    12/22/2012 "used to smoke cigarettes once or twice/month; haven't had any cigarettes for years"  Vaping Use   Vaping Use: Never used  Substance and Sexual Activity   Alcohol use: Yes    Comment: occasional    Drug use: No   Sexual activity: Yes  Other Topics Concern   Not on file  Social History Narrative   Right Handed   Lives in a one story home   Drinks little caffeine    Social Determinants of Health   Financial Resource Strain: Not on file  Food Insecurity: Not on file  Transportation Needs: Not on file  Physical Activity: Not on file  Stress: Not on file  Social Connections: Not on file  Intimate Partner Violence: Not on file    Family History  Problem Relation Age of Onset   Hypertension Mother    Diabetes Mother    Diabetes Maternal Aunt    Lung cancer Maternal Grandfather    Colon polyps Maternal Uncle    Colon cancer Maternal Uncle    Heart disease Maternal Uncle    Kidney disease Neg Hx    Esophageal cancer Neg Hx    Rectal cancer  Neg Hx    Stomach cancer Neg Hx      Review of Systems  Constitutional: Negative.  Negative for chills and fever.  HENT:  Negative for congestion and sore throat.   Respiratory:  Positive for shortness of breath. Negative for cough and hemoptysis.   Cardiovascular:  Positive for chest pain (Chest tightness).  Gastrointestinal: Negative.  Negative for abdominal pain, diarrhea, nausea and vomiting.  Genitourinary: Negative.  Negative for dysuria and hematuria.  Skin: Negative.  Negative for rash.  Neurological:  Negative for dizziness and headaches.  All other systems reviewed and are negative.  Today's Vitals   02/14/21 0957  BP: (!) 152/88  Pulse: 100  Temp: 97.9 F (36.6 C)  TempSrc: Oral  SpO2: 96%  Weight: 207 lb (93.9 kg)  Height: 5\' 6"  (1.676 m)   Body mass index is 33.41 kg/m.  Physical Exam Vitals reviewed.  Constitutional:      Comments: Some anasarca  HENT:     Head: Normocephalic.  Eyes:     Extraocular Movements: Extraocular movements intact.     Conjunctiva/sclera: Conjunctivae normal.     Pupils: Pupils are equal, round, and reactive to light.  Cardiovascular:     Rate and Rhythm: Normal rate and regular rhythm.     Pulses: Normal pulses.     Heart sounds: Normal heart sounds.     Comments: +S3 Pulmonary:     Breath sounds: Rales (Both bases) present.  Abdominal:     Comments: Ascites  Musculoskeletal:        General: Normal range of motion.     Cervical back: Normal range of motion and neck supple.  Skin:    General: Skin is warm and dry.     Capillary Refill: Capillary refill takes less than 2 seconds.     Comments: Fluid buildup in torso  Neurological:     General: No focal deficit present.     Mental Status: He is alert and oriented to person, place, and time.  Psychiatric:        Mood and Affect: Mood normal.  Behavior: Behavior normal.   EKG: Normal sinus rhythm with ventricular rate of 96/min.  Deeply inverted T waves in 1 and  aVL and V6.  LAE  ASSESSMENT & PLAN: Meets criteria for emergency dialysis.  Advised to go to [facility] emergency department now.  Prefers to use private transportation. Hector Mahoney was seen today for shortness of breath.  Diagnoses and all orders for this visit:  Shortness of breath Comments: Rule out cardiac ischemic event  Type 1 diabetes mellitus with stage 5 chronic kidney disease not on chronic dialysis ([place])  ESRD on hemodialysis ([facility])  Other hypervolemia  Abnormal EKG Comments: Suspected hyperkalemia  LVH (left ventricular hypertrophy)  Hypertension associated with diabetes ([place])  Dyslipidemia associated with type 2 diabetes mellitus [facility])  Patient Instructions  Pulmonary Edema  Pulmonary edema is when fluid collects in the air sacs of the lungs. This makes it hard for a person to breathe. The condition is an emergency. It needs to betreated right away. What are the causes? This condition is often caused by heart failure. Heart failure may be caused by: Coronary artery disease. High blood pressure. Infection of the heart from a virus (myocarditis). Leaky or stiff heart valves. Irregular heartbeat (arrhythmia). Fluid buildup caused by kidney problems. Other causes of pulmonary edema include: Infection in the lungs (pneumonia), blood, or other parts of the body. A very bad injury to the chest. Lung injury from heat or harmful substances, such as breathing in smoke or poisonous gas. Breathing in (inhaling) vomit or water. Certain medicines. High altitude. What are the signs or symptoms? Shortness of breath. Coughing with frothy or bloody mucus. Making high-pitched whistling sounds when you breathe, most often when you breathe out (wheezing). Feeling like you cannot get enough air. Shallow and fast breathing. Skin that is cool and damp, and has a pale or bluish color. How is this treated? Treatment depends on the cause and may include: Getting oxygen  through tubes in your nose or through a face mask. In very bad cases, you may need a breathing tube hooked up to a breathing machine (ventilator). Medicines to: Help the body get rid of extra water. Help the heart pump blood well. Treat or prevent blood clots. If poor heart function is the cause, treatment may also include procedures torepair heart problems or devices such as a pacemaker. If an infection is the cause, you may be given antibiotic medicine. Follow these instructions at home: Medicines Take over-the-counter and prescription medicines only as told by your doctor. If you were prescribed an antibiotic medicine, take it as told by your doctor. Do not stop taking it even if you start to feel better. Ask your doctor to help you write a plan with information about each medicine you take. This should include: Why you are taking it. Possible side effects. Best time of day to take it. Foods to take with it, or foods to avoid. When to call your doctor. Make a list of each medicine, vitamin, or herbal supplement you take. Keep the list with you at all times. Show the list to your doctor at each visit and before starting a new medicine. Keep the list up to date. Lifestyle  Do regular exercise as told by your doctor. It is important to do it safely. You can do this by: Pacing your activities. This will help you avoid shortness of breath or chest pain. Resting for at least 1 hour before and after meals. Asking about cardiac rehabilitation programs. These may  include: Education. Exercise plans. Counseling. Eat a heart-healthy diet that is low in salt, saturated fat, and cholesterol. Your doctor may suggest foods that are high in fiber, such as: Fresh fruits and vegetables. Whole grains. Beans. Do not smoke or use any products that contain nicotine or tobacco. If you need help quitting, ask your doctor.  General instructions Stay at a healthy weight. Keep a record of your  weight. Record your hospital or clinic weight. When you get home, compare it to your scale and record your weight. Weigh yourself first thing in the morning each day, and record the weights. You should weigh yourself every morning after you pee and before you eat breakfast. Wear the same amount of clothing each time you weigh yourself. Share your weight record with your doctor. These can help your doctor see if your body is holding extra fluid. Tell your doctor right away if you gain weight quickly. Your medicines may need to be adjusted. Check your blood pressure as often as told by your doctor. Write down your blood pressure readings. Bring them with you for your clinic visits. Think about doing therapy or joining a support group. Keep all follow-up visits. Contact a doctor if: You have a dry, hacking cough. You are gaining weight slowly. Your shortness of breath gets worse when you do an activity. You have more swelling in your hands, feet, ankles, or belly (abdomen). You have trouble sleeping because it is hard to breathe. Your blood pressure is higher than 180/120. Get help right away if: You gain weight quickly. This means gaining more than 2-3 lb (1-1.4 kg) in 24 hours or more than 5 lb (2.3 kg) in a week. You have very bad chest pain, especially if the pain is crushing or pressure-like and spreads to the arms, back, neck, or jaw. You have unusual sweating. Your skin turns blue or pale. Your shortness of breath happens without any activity. You have new or worse dizziness, blurred vision, headache, or unsteadiness. You are coughing often or cough up bloody mucus (sputum). You feel a racing heartbeat (palpitations). You have anxiety or a feeling that you cannot get enough air. You cannot breathe when lying flat. Your blood pressure is higher than 180/120 and you have any of the above symptoms. These symptoms may be an emergency. Get help right away. Call your local emergency services  (911 in the [place] Do not wait to see if the symptoms will go away. Do not drive yourself to the hospital. Summary Pulmonary edema is when fluid collects in the air sacs of the lungs. This condition is an emergency. Keep a record of your weight. Get help right away if you gain weight quickly. This information is not intended to replace advice given to you by your health care provider. Make sure you discuss any questions you have with your healthcare provider. Document Revised: 06/21/2020 Document Reviewed: 06/21/2020 Elsevier Patient Education  2022 [place], MD [facility]

## 2021-02-14 NOTE — ED Provider Notes (Signed)
Emergency Department Provider Note   I have reviewed the triage vital signs and the nursing notes.   HISTORY  Chief Complaint No chief complaint on file.   HPI Hector Mahoney is a 50 y.o. male with past medical history reviewed below including end-stage renal disease on home hemodialysis presents to the emergency department with feeling of orthopnea and that he is hanging onto more fluid than normal.  His dry weight is 91 kg which was increased from 89 kg by his nephrologist recently.  He states he is feeling like maybe that number is too high and that he is hanging onto some fluid.  He is feeling worse with lying flat and having some dyspnea on exertion.  He is not having chest pain.  No fevers or chills.  No cough or congestion symptoms.  He has been compliant with his home hemodialysis but has had issues once with some cramping in the legs recently as well as another time having a kinked line.   Past Medical History:  Diagnosis Date   Anemia    CHF (congestive heart failure) ([place])    DIABETES MELLITUS, TYPE I 07/30/2007   Esophageal reflux 04/27/2009   ESRD (end stage renal disease) on dialysis [facility])    home HD 4x/week   HYPERLIPIDEMIA 04/24/2007   HYPERTENSION 04/24/2007    Patient Active Problem List   Diagnosis Date Noted   COVID-19 12/22/2020   RLS (restless legs syndrome) 08/11/2019   Iron deficiency anemia 06/25/2019   Allergic rhinitis 05/31/2019   Acute dysfunction of right eustachian tube 05/31/2019   Vertigo 05/31/2019   ESRD on hemodialysis ([place]) 05/31/2019   Abdominal pain 03/26/2018   Nausea 03/26/2018   Diarrhea 03/26/2018   Acute renal failure superimposed on stage 5 chronic kidney disease, not on chronic dialysis ([place]) 10/20/2017   Hyperkalemia 10/20/2017   CKD stage 5 due to type 1 diabetes mellitus ([place]) 09/27/2017   Low back pain 03/29/2017   Nocturnal leg cramps 04/03/2016   Hypertensive urgency 09/02/2015   Acute on chronic diastolic heart  failure ([place]) 09/02/2015   Patient nonadherence 09/02/2015   Dyspnea 12/22/2012   Insulin dependent diabetes mellitus with complications 76/73/4193   Preventative health care 04/01/2011   Esophageal reflux 04/27/2009   INSOMNIA-SLEEP DISORDER-UNSPEC 04/27/2009   Type 1 diabetes mellitus ([place]) 07/30/2007   Dyslipidemia 04/24/2007   Essential hypertension 04/24/2007    Past Surgical History:  Procedure Laterality Date   AV FISTULA PLACEMENT Left 06/04/2019   Procedure: BRACHIAL-CEPHALIC ARTERIOVENOUS (AV) FISTULA CREATION LEFT ARM;  Surgeon: Rosetta Posner, MD;  Location: [facility] OR;  Service: Vascular;  Laterality: Left;   COLONOSCOPY     polyp   IR FLUORO GUIDE CV LINE RIGHT  10/22/2017   IR US GUIDE VASC ACCESS RIGHT  10/22/2017   MOUTH SURGERY     tooth ext    Allergies Patient has no known allergies.  Family History  Problem Relation Age of Onset   Hypertension Mother    Diabetes Mother    Diabetes Maternal Aunt    Lung cancer Maternal Grandfather    Colon polyps Maternal Uncle    Colon cancer Maternal Uncle    Heart disease Maternal Uncle    Kidney disease Neg Hx    Esophageal cancer Neg Hx    Rectal cancer Neg Hx    Stomach cancer Neg Hx     Social History Social History   Tobacco Use   Smoking status: Former    Pack  years: 0.00    Types: Cigarettes    Quit date: 05/03/2015    Years since quitting: 5.7   Smokeless tobacco: Never   Tobacco comments:    12/22/2012 "used to smoke cigarettes once or twice/month; haven't had any cigarettes for years"  Vaping Use   Vaping Use: Never used  Substance Use Topics   Alcohol use: Yes    Comment: occasional    Drug use: No    Review of Systems  Constitutional: No fever/chills Eyes: No visual changes. ENT: No sore throat. Cardiovascular: Denies chest pain. Respiratory: Positive shortness of breath. Gastrointestinal: No abdominal pain.  No nausea, no vomiting.  No diarrhea.  No constipation. Genitourinary: Negative  for dysuria. Musculoskeletal: Negative for back pain. Skin: Negative for rash. Neurological: Negative for headaches, focal weakness or numbness.  10-point ROS otherwise negative.  ____________________________________________   PHYSICAL EXAM:  VITAL SIGNS: ED Triage Vitals  Enc Vitals Group     BP 02/14/21 1236 (!) 156/100     Pulse Rate 02/14/21 1236 (!) 101     Resp 02/14/21 1236 18     Temp 02/14/21 1236 98.8 F (37.1 C)     Temp Source 02/14/21 1236 Oral     SpO2 02/14/21 1236 100 %   Constitutional: Alert and oriented. Well appearing and in no acute distress. Eyes: Conjunctivae are normal. Head: Atraumatic. Nose: No congestion/rhinnorhea. Mouth/Throat: Mucous membranes are moist.  Neck: No stridor.  Cardiovascular: Normal rate, regular rhythm. Good peripheral circulation. Grossly normal heart sounds.   Respiratory: Normal respiratory effort.  No retractions. Lungs CTAB. Gastrointestinal: Soft and nontender. No distention.  Musculoskeletal: No lower extremity tenderness nor edema. No gross deformities of extremities. Neurologic:  Normal speech and language. No gross focal neurologic deficits are appreciated.  Skin:  Skin is warm, dry and intact. No rash noted.  ____________________________________________   LABS (all labs ordered are listed, but only abnormal results are displayed)  Labs Reviewed  BASIC METABOLIC PANEL - Abnormal; Notable for the following components:      Result Value   Sodium 133 (*)    Potassium 6.7 (*)    Chloride 93 (*)    Glucose, Bld 130 (*)    BUN 122 (*)    Creatinine, Ser 18.42 (*)    GFR, Estimated 3 (*)    Anion gap 18 (*)    All other components within normal limits  CBC - Abnormal; Notable for the following components:   WBC 11.3 (*)    Hemoglobin 11.8 (*)    HCT 37.4 (*)    RDW 15.6 (*)    All other components within normal limits  BRAIN NATRIURETIC PEPTIDE - Abnormal; Notable for the following components:   B Natriuretic  Peptide 446.6 (*)    All other components within normal limits  TROPONIN I (HIGH SENSITIVITY) - Abnormal; Notable for the following components:   Troponin I (High Sensitivity) 55 (*)    All other components within normal limits  RESP PANEL BY RT-PCR (FLU A&B, COVID) [id number]  TROPONIN I (HIGH SENSITIVITY)   ____________________________________________  EKG   EKG Interpretation  Date/Time:  Tuesday February 14 2021 12:39:02 EDT Ventricular Rate:  99 PR Interval:  204 QRS Duration: 94 QT Interval:  366 QTC Calculation: 469 R Axis:   -50 Text Interpretation: Normal sinus rhythm Left axis deviation T wave abnormality, consider lateral ischemia Abnormal ECG Confirmed by Nanda Quinton [phone]) on 02/14/2021 3:59:00 PM         ____________________________________________  RADIOLOGY  DG Chest 2 View  Result Date: 02/14/2021 CLINICAL DATA:  Provided history: Shortness of breath. Additional history provided: Patient reports 2 days of shortness of breath, worse with exertion. Patient denies chest pain. EXAM: CHEST - 2 VIEW COMPARISON:  Prior chest radiographs 12/22/2020 and earlier. FINDINGS: Heart size within normal limits. Subtle ill-defined opacity within the left lung base. Mild linear atelectasis within the right lung base. No evidence of pleural effusion or pneumothorax. No acute bony abnormality identified. IMPRESSION: Subtle ill-defined opacity within the left lung base. Findings are nonspecific but may reflect atelectasis, pneumonia or asymmetric edema. Clinical correlation is recommended. Additionally, radiographic follow-up to resolution is recommended. Mild linear atelectasis within the right lung base. Electronically Signed   By: Kellie Simmering DO   On: 02/14/2021 13:34    ____________________________________________   PROCEDURES  Procedure(s) performed:   Procedures  CRITICAL CARE Performed by: Margette Fast Total critical care time: 35 minutes Critical care time was exclusive  of separately billable procedures and treating other patients. Critical care was necessary to treat or prevent imminent or life-threatening deterioration. Critical care was time spent personally by me on the following activities: development of treatment plan with patient and/or surrogate as well as nursing, discussions with consultants, evaluation of patient's response to treatment, examination of patient, obtaining history from patient or surrogate, ordering and performing treatments and interventions, ordering and review of laboratory studies, ordering and review of radiographic studies, pulse oximetry and re-evaluation of patient's condition.  Nanda Quinton, MD Emergency Medicine  ____________________________________________   INITIAL IMPRESSION / ASSESSMENT AND PLAN / ED COURSE  Pertinent labs & imaging results that were available during my care of the patient were reviewed by me and considered in my medical decision making (see chart for details).   Patient presents emergency department with shortness of breath and increased weight from his dry weight.  He does hemodialysis at home.  His lab work shows potassium of 6.7 but no significant EKG changes.  He is not hypoxemic or in respiratory distress.  Discussed the case with nephrology who plan to dialyze the patient here and if he is feeling better can be discharged home afterwards.  Per protocol, the patient will likely come back through the emergency department for quick assessment prior to discharge.   I reviewed all nursing notes, vitals, pertinent old records, EKGs, labs, imaging (as available).   ____________________________________________  FINAL CLINICAL IMPRESSION(S) / ED DIAGNOSES  Final diagnoses:  Hyperkalemia  SOB (shortness of breath)    MEDICATIONS GIVEN DURING THIS VISIT:  Medications  Chlorhexidine Gluconate Cloth 2 % PADS 6 each (has no administration in time range)    Note:  This document was prepared using  Dragon voice recognition software and may include unintentional dictation errors.  Nanda Quinton, MD, [facility] Emergency Medicine    Alonah Lineback, [facility], MD 02/14/21 1600

## 2021-02-14 NOTE — Discharge Instructions (Signed)
You have been seen and discharged from the emergency department.  You received dialysis today.  Follow-up with your primary provider for reevaluation and further care. Take home medications as prescribed. If you have any worsening symptoms or further concerns for your health please return to an emergency department for further evaluation.

## 2021-02-14 NOTE — ED Triage Notes (Addendum)
Patient complains of 2 days of SOB. States worse with exertion. No cold nor cough symptoms. Denies CP. Reports that his last dialysis treatment was yesterday

## 2021-02-14 NOTE — ED Provider Notes (Signed)
Emergency Medicine Provider Triage Evaluation Note  Hector Mahoney , a 50 y.o. male  was evaluated in triage.  Pt complains of shortness of breath for the past 2 days.  Seen via telehealth today, instructed to be seen in the ED due to worsening shortness of breath, and orthopnea.  Home hemodialysis performed yesterday morning.  Reports continued weight gain, similar to his prior episode of hospitalization.  Review of Systems  Positive: Shortness of breath,   Negative: Fever, leg swelling, chest pain, cough  Physical Exam  BP (!) 156/100   Pulse (!) 101   Temp 98.8 F (37.1 C) (Oral)   Resp 18   SpO2 100%  Gen:   Awake, no distress   Resp:  Normal effort  MSK:   Moves extremities without difficulty  Other:    Medical Decision Making  Medically screening exam initiated at 12:43 PM.  Appropriate orders placed.  Hector Mahoney was informed that the remainder of the evaluation will be completed by another provider, this initial triage assessment does not replace that evaluation, and the importance of remaining in the ED until their evaluation is complete.     [name], PA-C 02/14/21 1248    [name], MD 02/15/21 [phone]

## 2021-02-14 NOTE — ED Notes (Signed)
Pt discharged and ambulated out of the ED without difficulty. 

## 2021-02-14 NOTE — Telephone Encounter (Signed)
[facility] FYI:  ---Caller states he has SOB that gets worse when laying down that started 3 weeks ago, SOB is not that bad with sitting, increases with activity and laying down, will have a rumbling in chest when breathing in, he called EMS today and they instructed he had some crackles in lungs, they instructed if he went to ER there would be a wait of 12-24 hr wait to be seen and due to his condition he declined to go to ER, is getting HD at home, had dialysis on Thursday and something just was not going right, he turned machine up higher than normal, during treatments body involuntary jerk and he spoke with HD nurse and they decided to cut Tx short, has HD mon, Tue, Wed and Friday, On Monday he was on HD machine and it had a kink in it, and he was instructed to ramp it down and focus on getting fluid off, yesterday he went to bed and again the jerking started to legs and arm, jerking is increasing to nightly, he got a cramp in L leg and got up to walk it off and that is when SOB breath started, denies cough, congestion, Fluid restriction is 32 ounces per day and is trying to drink it daily, urine output slow, no known exp to CV-19 in last 14 days  Advised to go to ED now

## 2021-02-14 NOTE — ED Provider Notes (Signed)
50 year old male who is end-stage renal disease on hemodialysis presented today with shortness of breath.  Found to have fluid overload with hyperkalemia.  He received dialysis today.  Was transported back to the emergency department afterwards.  He states that his symptoms have significantly improved and he is requesting to be discharged home.  Vitals are stable, appears appropriate for outpatient follow-up.  Patient will be discharged and treated as an outpatient.  Discharge plan and strict return to ED precautions discussed, patient verbalizes understanding and agreement.   Lorelle Gibbs, DO 02/14/21 2136

## 2021-02-14 NOTE — Progress Notes (Signed)
Nephrology Quick Note:  Consulted to see Mr. Seddon for hyperkalemia and pulmonary edema.  S: He is a home HD patient, dialyzed on MTuThFri schedule. Reports orthopnea for past 2 nights, really bad last night despite HD yesterday. Also with concern of myoclonic jerking which has been an intermittently problem, but now a nightly issue. Has had issues with HD machine twice in the past week - last Thurs accidentally set the UF goal wrong, then HD on Monday realized that one of his lines was kinked and had to turn off the dialysate flow. Of note, Friday's treatment went fine.Denies CP, fever, chills, N/V, diarrhea, or edema.  O: Blood pressure (!) 174/116, pulse 99, temperature 98.8 F (37.1 C), temperature source Oral, resp. rate 18, SpO2 98 %.   Exam: Gen: Well appearing, NAD CV: RRR; no murmur PULM: Bibasilar faint rales, no wheezing EXTREM: No LE edema ACCESS: LUE AVF + buttonholes, + thrill/bruit  Labs: K 6.7, BUN 122, Cr 18.4  A/P: - Hyperkalemia: Recent dialysis machine issues + diet indiscretion. HD today to correct. - ESRD: ^ BUN/Cr, recent HD machine issues. HD today - follow OP labs, if not improving then will need f'gram to eval for AVF recirculation. - Dyspnea: Suspect weight loss and needs EDW lowered - will go for decent UF goal today. - Myoclonic jerking: Disc most likely causes include under-dialysis and meds, specifically gabapentin which he takes. Would rec to d/c gabapentin and follow labs, as above.  Discussed with ED physician - plan is for HD and then hopefully can be discharged home afterwards.  Veneta Penton, PA-C [employer] Pager [phone]

## 2021-02-14 NOTE — Patient Instructions (Signed)
Pulmonary Edema  Pulmonary edema is when fluid collects in the air sacs of the lungs. This makes it hard for a person to breathe. The condition is an emergency. It needs to betreated right away. What are the causes? This condition is often caused by heart failure. Heart failure may be caused by: Coronary artery disease. High blood pressure. Infection of the heart from a virus (myocarditis). Leaky or stiff heart valves. Irregular heartbeat (arrhythmia). Fluid buildup caused by kidney problems. Other causes of pulmonary edema include: Infection in the lungs (pneumonia), blood, or other parts of the body. A very bad injury to the chest. Lung injury from heat or harmful substances, such as breathing in smoke or poisonous gas. Breathing in (inhaling) vomit or water. Certain medicines. High altitude. What are the signs or symptoms? Shortness of breath. Coughing with frothy or bloody mucus. Making high-pitched whistling sounds when you breathe, most often when you breathe out (wheezing). Feeling like you cannot get enough air. Shallow and fast breathing. Skin that is cool and damp, and has a pale or bluish color. How is this treated? Treatment depends on the cause and may include: Getting oxygen through tubes in your nose or through a face mask. In very bad cases, you may need a breathing tube hooked up to a breathing machine (ventilator). Medicines to: Help the body get rid of extra water. Help the heart pump blood well. Treat or prevent blood clots. If poor heart function is the cause, treatment may also include procedures torepair heart problems or devices such as a pacemaker. If an infection is the cause, you may be given antibiotic medicine. Follow these instructions at home: Medicines Take over-the-counter and prescription medicines only as told by your doctor. If you were prescribed an antibiotic medicine, take it as told by your doctor. Do not stop taking it even if you start to  feel better. Ask your doctor to help you write a plan with information about each medicine you take. This should include: Why you are taking it. Possible side effects. Best time of day to take it. Foods to take with it, or foods to avoid. When to call your doctor. Make a list of each medicine, vitamin, or herbal supplement you take. Keep the list with you at all times. Show the list to your doctor at each visit and before starting a new medicine. Keep the list up to date. Lifestyle  Do regular exercise as told by your doctor. It is important to do it safely. You can do this by: Pacing your activities. This will help you avoid shortness of breath or chest pain. Resting for at least 1 hour before and after meals. Asking about cardiac rehabilitation programs. These may include: Education. Exercise plans. Counseling. Eat a heart-healthy diet that is low in salt, saturated fat, and cholesterol. Your doctor may suggest foods that are high in fiber, such as: Fresh fruits and vegetables. Whole grains. Beans. Do not smoke or use any products that contain nicotine or tobacco. If you need help quitting, ask your doctor.  General instructions Stay at a healthy weight. Keep a record of your weight. Record your hospital or clinic weight. When you get home, compare it to your scale and record your weight. Weigh yourself first thing in the morning each day, and record the weights. You should weigh yourself every morning after you pee and before you eat breakfast. Wear the same amount of clothing each time you weigh yourself. Share your weight record with your  doctor. These can help your doctor see if your body is holding extra fluid. Tell your doctor right away if you gain weight quickly. Your medicines may need to be adjusted. Check your blood pressure as often as told by your doctor. Write down your blood pressure readings. Bring them with you for your clinic visits. Think about doing therapy or  joining a support group. Keep all follow-up visits. Contact a doctor if: You have a dry, hacking cough. You are gaining weight slowly. Your shortness of breath gets worse when you do an activity. You have more swelling in your hands, feet, ankles, or belly (abdomen). You have trouble sleeping because it is hard to breathe. Your blood pressure is higher than 180/120. Get help right away if: You gain weight quickly. This means gaining more than 2-3 lb (1-1.4 kg) in 24 hours or more than 5 lb (2.3 kg) in a week. You have very bad chest pain, especially if the pain is crushing or pressure-like and spreads to the arms, back, neck, or jaw. You have unusual sweating. Your skin turns blue or pale. Your shortness of breath happens without any activity. You have new or worse dizziness, blurred vision, headache, or unsteadiness. You are coughing often or cough up bloody mucus (sputum). You feel a racing heartbeat (palpitations). You have anxiety or a feeling that you cannot get enough air. You cannot breathe when lying flat. Your blood pressure is higher than 180/120 and you have any of the above symptoms. These symptoms may be an emergency. Get help right away. Call your local emergency services (911 in the [place] Do not wait to see if the symptoms will go away. Do not drive yourself to the hospital. Summary Pulmonary edema is when fluid collects in the air sacs of the lungs. This condition is an emergency. Keep a record of your weight. Get help right away if you gain weight quickly. This information is not intended to replace advice given to you by your health care provider. Make sure you discuss any questions you have with your healthcare provider. Document Revised: 06/21/2020 Document Reviewed: 06/21/2020 Elsevier Patient Education  2022 [employer].

## 2021-02-15 DIAGNOSIS — N186 End stage renal disease: Secondary | ICD-10-CM | POA: Diagnosis not present

## 2021-02-15 DIAGNOSIS — Z992 Dependence on renal dialysis: Secondary | ICD-10-CM | POA: Diagnosis not present

## 2021-02-15 DIAGNOSIS — N2581 Secondary hyperparathyroidism of renal origin: Secondary | ICD-10-CM | POA: Diagnosis not present

## 2021-02-15 DIAGNOSIS — E875 Hyperkalemia: Secondary | ICD-10-CM | POA: Diagnosis not present

## 2021-02-16 ENCOUNTER — Telehealth: Payer: Self-pay | Admitting: Internal Medicine

## 2021-02-16 DIAGNOSIS — N2581 Secondary hyperparathyroidism of renal origin: Secondary | ICD-10-CM | POA: Diagnosis not present

## 2021-02-16 DIAGNOSIS — Z992 Dependence on renal dialysis: Secondary | ICD-10-CM | POA: Diagnosis not present

## 2021-02-16 DIAGNOSIS — E669 Obesity, unspecified: Secondary | ICD-10-CM | POA: Diagnosis not present

## 2021-02-16 DIAGNOSIS — E875 Hyperkalemia: Secondary | ICD-10-CM | POA: Diagnosis not present

## 2021-02-16 DIAGNOSIS — N186 End stage renal disease: Secondary | ICD-10-CM | POA: Diagnosis not present

## 2021-02-16 DIAGNOSIS — E1122 Type 2 diabetes mellitus with diabetic chronic kidney disease: Secondary | ICD-10-CM | POA: Diagnosis not present

## 2021-02-16 DIAGNOSIS — I120 Hypertensive chronic kidney disease with stage 5 chronic kidney disease or end stage renal disease: Secondary | ICD-10-CM | POA: Diagnosis not present

## 2021-02-16 NOTE — Telephone Encounter (Signed)
   Patient requesting order for echocardiogram  Patient states his specialist at [facility] in [place] is wanting PCP to order.   Please contact patient

## 2021-02-17 DIAGNOSIS — E875 Hyperkalemia: Secondary | ICD-10-CM | POA: Diagnosis not present

## 2021-02-17 DIAGNOSIS — N2581 Secondary hyperparathyroidism of renal origin: Secondary | ICD-10-CM | POA: Diagnosis not present

## 2021-02-17 DIAGNOSIS — Z992 Dependence on renal dialysis: Secondary | ICD-10-CM | POA: Diagnosis not present

## 2021-02-17 DIAGNOSIS — N186 End stage renal disease: Secondary | ICD-10-CM | POA: Diagnosis not present

## 2021-02-17 NOTE — Telephone Encounter (Signed)
Sorry, I would need more information as to why and an OV to order a $3000 test

## 2021-02-18 DIAGNOSIS — N186 End stage renal disease: Secondary | ICD-10-CM | POA: Diagnosis not present

## 2021-02-18 DIAGNOSIS — Z992 Dependence on renal dialysis: Secondary | ICD-10-CM | POA: Diagnosis not present

## 2021-02-18 DIAGNOSIS — E875 Hyperkalemia: Secondary | ICD-10-CM | POA: Diagnosis not present

## 2021-02-18 DIAGNOSIS — N2581 Secondary hyperparathyroidism of renal origin: Secondary | ICD-10-CM | POA: Diagnosis not present

## 2021-02-20 ENCOUNTER — Other Ambulatory Visit: Payer: Self-pay | Admitting: Gastroenterology

## 2021-02-20 DIAGNOSIS — E875 Hyperkalemia: Secondary | ICD-10-CM | POA: Diagnosis not present

## 2021-02-20 DIAGNOSIS — N2581 Secondary hyperparathyroidism of renal origin: Secondary | ICD-10-CM | POA: Diagnosis not present

## 2021-02-20 DIAGNOSIS — N186 End stage renal disease: Secondary | ICD-10-CM | POA: Diagnosis not present

## 2021-02-20 DIAGNOSIS — Z992 Dependence on renal dialysis: Secondary | ICD-10-CM | POA: Diagnosis not present

## 2021-02-21 DIAGNOSIS — E875 Hyperkalemia: Secondary | ICD-10-CM | POA: Diagnosis not present

## 2021-02-21 DIAGNOSIS — Z992 Dependence on renal dialysis: Secondary | ICD-10-CM | POA: Diagnosis not present

## 2021-02-21 DIAGNOSIS — N186 End stage renal disease: Secondary | ICD-10-CM | POA: Diagnosis not present

## 2021-02-21 DIAGNOSIS — N2581 Secondary hyperparathyroidism of renal origin: Secondary | ICD-10-CM | POA: Diagnosis not present

## 2021-02-22 ENCOUNTER — Other Ambulatory Visit (HOSPITAL_COMMUNITY): Payer: Self-pay | Admitting: Nephrology

## 2021-02-22 DIAGNOSIS — Z7682 Awaiting organ transplant status: Secondary | ICD-10-CM

## 2021-02-23 DIAGNOSIS — N2581 Secondary hyperparathyroidism of renal origin: Secondary | ICD-10-CM | POA: Diagnosis not present

## 2021-02-23 DIAGNOSIS — N186 End stage renal disease: Secondary | ICD-10-CM | POA: Diagnosis not present

## 2021-02-23 DIAGNOSIS — E875 Hyperkalemia: Secondary | ICD-10-CM | POA: Diagnosis not present

## 2021-02-23 DIAGNOSIS — Z992 Dependence on renal dialysis: Secondary | ICD-10-CM | POA: Diagnosis not present

## 2021-02-23 NOTE — Telephone Encounter (Signed)
Spoke with patient today.  Test has already been set up through specialist.

## 2021-02-24 DIAGNOSIS — N2581 Secondary hyperparathyroidism of renal origin: Secondary | ICD-10-CM | POA: Diagnosis not present

## 2021-02-24 DIAGNOSIS — E875 Hyperkalemia: Secondary | ICD-10-CM | POA: Diagnosis not present

## 2021-02-24 DIAGNOSIS — N186 End stage renal disease: Secondary | ICD-10-CM | POA: Diagnosis not present

## 2021-02-24 DIAGNOSIS — Z992 Dependence on renal dialysis: Secondary | ICD-10-CM | POA: Diagnosis not present

## 2021-02-27 DIAGNOSIS — Z992 Dependence on renal dialysis: Secondary | ICD-10-CM | POA: Diagnosis not present

## 2021-02-27 DIAGNOSIS — N186 End stage renal disease: Secondary | ICD-10-CM | POA: Diagnosis not present

## 2021-02-27 DIAGNOSIS — N2581 Secondary hyperparathyroidism of renal origin: Secondary | ICD-10-CM | POA: Diagnosis not present

## 2021-02-27 DIAGNOSIS — E875 Hyperkalemia: Secondary | ICD-10-CM | POA: Diagnosis not present

## 2021-02-28 DIAGNOSIS — N2581 Secondary hyperparathyroidism of renal origin: Secondary | ICD-10-CM | POA: Diagnosis not present

## 2021-02-28 DIAGNOSIS — N186 End stage renal disease: Secondary | ICD-10-CM | POA: Diagnosis not present

## 2021-02-28 DIAGNOSIS — E875 Hyperkalemia: Secondary | ICD-10-CM | POA: Diagnosis not present

## 2021-02-28 DIAGNOSIS — Z992 Dependence on renal dialysis: Secondary | ICD-10-CM | POA: Diagnosis not present

## 2021-03-01 ENCOUNTER — Ambulatory Visit (HOSPITAL_COMMUNITY)
Admission: RE | Admit: 2021-03-01 | Discharge: 2021-03-01 | Disposition: A | Discharge status: Home / Self Care | Payer: Federal, State, Local not specified - PPO | Source: Ambulatory Visit | Attending: Nephrology | Admitting: Nephrology

## 2021-03-01 DIAGNOSIS — Z7682 Awaiting organ transplant status: Secondary | ICD-10-CM

## 2021-03-01 LAB — ECHOCARDIOGRAM COMPLETE
Calc EF: 44.4 %
S' Lateral: 3.4 cm
Single Plane A2C EF: 47.5 %
Single Plane A4C EF: 46.6 %

## 2021-03-02 DIAGNOSIS — Z992 Dependence on renal dialysis: Secondary | ICD-10-CM | POA: Diagnosis not present

## 2021-03-02 DIAGNOSIS — E103511 Type 1 diabetes mellitus with proliferative diabetic retinopathy with macular edema, right eye: Secondary | ICD-10-CM | POA: Diagnosis not present

## 2021-03-02 DIAGNOSIS — E875 Hyperkalemia: Secondary | ICD-10-CM | POA: Diagnosis not present

## 2021-03-02 DIAGNOSIS — E103512 Type 1 diabetes mellitus with proliferative diabetic retinopathy with macular edema, left eye: Secondary | ICD-10-CM | POA: Diagnosis not present

## 2021-03-02 DIAGNOSIS — N186 End stage renal disease: Secondary | ICD-10-CM | POA: Diagnosis not present

## 2021-03-02 DIAGNOSIS — E103513 Type 1 diabetes mellitus with proliferative diabetic retinopathy with macular edema, bilateral: Secondary | ICD-10-CM | POA: Diagnosis not present

## 2021-03-02 DIAGNOSIS — N2581 Secondary hyperparathyroidism of renal origin: Secondary | ICD-10-CM | POA: Diagnosis not present

## 2021-03-03 DIAGNOSIS — N186 End stage renal disease: Secondary | ICD-10-CM | POA: Diagnosis not present

## 2021-03-03 DIAGNOSIS — Z992 Dependence on renal dialysis: Secondary | ICD-10-CM | POA: Diagnosis not present

## 2021-03-03 DIAGNOSIS — N2581 Secondary hyperparathyroidism of renal origin: Secondary | ICD-10-CM | POA: Diagnosis not present

## 2021-03-03 DIAGNOSIS — E875 Hyperkalemia: Secondary | ICD-10-CM | POA: Diagnosis not present

## 2021-03-04 DIAGNOSIS — N2581 Secondary hyperparathyroidism of renal origin: Secondary | ICD-10-CM | POA: Diagnosis not present

## 2021-03-04 DIAGNOSIS — Z992 Dependence on renal dialysis: Secondary | ICD-10-CM | POA: Diagnosis not present

## 2021-03-04 DIAGNOSIS — E875 Hyperkalemia: Secondary | ICD-10-CM | POA: Diagnosis not present

## 2021-03-04 DIAGNOSIS — N186 End stage renal disease: Secondary | ICD-10-CM | POA: Diagnosis not present

## 2021-03-06 DIAGNOSIS — N2581 Secondary hyperparathyroidism of renal origin: Secondary | ICD-10-CM | POA: Diagnosis not present

## 2021-03-06 DIAGNOSIS — Z992 Dependence on renal dialysis: Secondary | ICD-10-CM | POA: Diagnosis not present

## 2021-03-06 DIAGNOSIS — N186 End stage renal disease: Secondary | ICD-10-CM | POA: Diagnosis not present

## 2021-03-06 DIAGNOSIS — E875 Hyperkalemia: Secondary | ICD-10-CM | POA: Diagnosis not present

## 2021-03-12 DIAGNOSIS — N186 End stage renal disease: Secondary | ICD-10-CM | POA: Diagnosis not present

## 2021-03-12 DIAGNOSIS — E1022 Type 1 diabetes mellitus with diabetic chronic kidney disease: Secondary | ICD-10-CM | POA: Diagnosis not present

## 2021-03-12 DIAGNOSIS — Z992 Dependence on renal dialysis: Secondary | ICD-10-CM | POA: Diagnosis not present

## 2021-03-13 HISTORY — PX: IR THROMBECTOMY AV FISTULA W/THROMBOLYSIS/PTA/STENT INC/SHUNT/IMG LT: IMG6107

## 2021-03-14 DIAGNOSIS — N2581 Secondary hyperparathyroidism of renal origin: Secondary | ICD-10-CM | POA: Diagnosis not present

## 2021-03-14 DIAGNOSIS — N186 End stage renal disease: Secondary | ICD-10-CM | POA: Diagnosis not present

## 2021-03-14 DIAGNOSIS — Z992 Dependence on renal dialysis: Secondary | ICD-10-CM | POA: Diagnosis not present

## 2021-03-16 ENCOUNTER — Other Ambulatory Visit: Payer: Self-pay | Admitting: Neurology

## 2021-03-16 DIAGNOSIS — Z992 Dependence on renal dialysis: Secondary | ICD-10-CM | POA: Diagnosis not present

## 2021-03-16 DIAGNOSIS — N186 End stage renal disease: Secondary | ICD-10-CM | POA: Diagnosis not present

## 2021-03-16 DIAGNOSIS — N2581 Secondary hyperparathyroidism of renal origin: Secondary | ICD-10-CM | POA: Diagnosis not present

## 2021-03-18 DIAGNOSIS — Z992 Dependence on renal dialysis: Secondary | ICD-10-CM | POA: Diagnosis not present

## 2021-03-18 DIAGNOSIS — N2581 Secondary hyperparathyroidism of renal origin: Secondary | ICD-10-CM | POA: Diagnosis not present

## 2021-03-18 DIAGNOSIS — N186 End stage renal disease: Secondary | ICD-10-CM | POA: Diagnosis not present

## 2021-03-21 DIAGNOSIS — N186 End stage renal disease: Secondary | ICD-10-CM | POA: Diagnosis not present

## 2021-03-21 DIAGNOSIS — N2581 Secondary hyperparathyroidism of renal origin: Secondary | ICD-10-CM | POA: Diagnosis not present

## 2021-03-21 DIAGNOSIS — Z992 Dependence on renal dialysis: Secondary | ICD-10-CM | POA: Diagnosis not present

## 2021-03-22 ENCOUNTER — Telehealth: Payer: Self-pay | Admitting: Gastroenterology

## 2021-03-22 ENCOUNTER — Other Ambulatory Visit: Payer: Self-pay

## 2021-03-22 DIAGNOSIS — N2581 Secondary hyperparathyroidism of renal origin: Secondary | ICD-10-CM | POA: Diagnosis not present

## 2021-03-22 DIAGNOSIS — N186 End stage renal disease: Secondary | ICD-10-CM | POA: Diagnosis not present

## 2021-03-22 DIAGNOSIS — Z992 Dependence on renal dialysis: Secondary | ICD-10-CM | POA: Diagnosis not present

## 2021-03-22 MED ORDER — GLYCOPYRROLATE 2 MG PO TABS
2.0000 mg | ORAL_TABLET | Freq: Two times a day (BID) | ORAL | 0 refills | Status: DC
Start: 2021-03-22 — End: 2021-04-19

## 2021-03-22 NOTE — Telephone Encounter (Signed)
Returned patient's call; he states he is having nausea after eating especially in the evening. He is out of his Robinul and Prilosec and was advised last month that he needed an office visit in order to get more refills. Scheduled office visit with Ellouise Newer PA on 04/18/21 and refilled 1 more month.

## 2021-03-24 DIAGNOSIS — N2581 Secondary hyperparathyroidism of renal origin: Secondary | ICD-10-CM | POA: Diagnosis not present

## 2021-03-24 DIAGNOSIS — N186 End stage renal disease: Secondary | ICD-10-CM | POA: Diagnosis not present

## 2021-03-24 DIAGNOSIS — Z992 Dependence on renal dialysis: Secondary | ICD-10-CM | POA: Diagnosis not present

## 2021-03-27 DIAGNOSIS — Z992 Dependence on renal dialysis: Secondary | ICD-10-CM | POA: Diagnosis not present

## 2021-03-27 DIAGNOSIS — N2581 Secondary hyperparathyroidism of renal origin: Secondary | ICD-10-CM | POA: Diagnosis not present

## 2021-03-27 DIAGNOSIS — N186 End stage renal disease: Secondary | ICD-10-CM | POA: Diagnosis not present

## 2021-03-29 DIAGNOSIS — Z992 Dependence on renal dialysis: Secondary | ICD-10-CM | POA: Diagnosis not present

## 2021-03-29 DIAGNOSIS — N186 End stage renal disease: Secondary | ICD-10-CM | POA: Diagnosis not present

## 2021-03-29 DIAGNOSIS — N2581 Secondary hyperparathyroidism of renal origin: Secondary | ICD-10-CM | POA: Diagnosis not present

## 2021-03-30 DIAGNOSIS — Z992 Dependence on renal dialysis: Secondary | ICD-10-CM | POA: Diagnosis not present

## 2021-03-30 DIAGNOSIS — I871 Compression of vein: Secondary | ICD-10-CM | POA: Diagnosis not present

## 2021-03-30 DIAGNOSIS — T82868A Thrombosis of vascular prosthetic devices, implants and grafts, initial encounter: Secondary | ICD-10-CM | POA: Diagnosis not present

## 2021-03-30 DIAGNOSIS — N186 End stage renal disease: Secondary | ICD-10-CM | POA: Diagnosis not present

## 2021-03-31 ENCOUNTER — Ambulatory Visit: Payer: Federal, State, Local not specified - PPO | Admitting: Internal Medicine

## 2021-03-31 DIAGNOSIS — Z992 Dependence on renal dialysis: Secondary | ICD-10-CM | POA: Diagnosis not present

## 2021-03-31 DIAGNOSIS — N186 End stage renal disease: Secondary | ICD-10-CM | POA: Diagnosis not present

## 2021-03-31 DIAGNOSIS — N2581 Secondary hyperparathyroidism of renal origin: Secondary | ICD-10-CM | POA: Diagnosis not present

## 2021-04-03 DIAGNOSIS — N2581 Secondary hyperparathyroidism of renal origin: Secondary | ICD-10-CM | POA: Diagnosis not present

## 2021-04-03 DIAGNOSIS — N186 End stage renal disease: Secondary | ICD-10-CM | POA: Diagnosis not present

## 2021-04-03 DIAGNOSIS — Z992 Dependence on renal dialysis: Secondary | ICD-10-CM | POA: Diagnosis not present

## 2021-04-05 DIAGNOSIS — Z992 Dependence on renal dialysis: Secondary | ICD-10-CM | POA: Diagnosis not present

## 2021-04-05 DIAGNOSIS — N186 End stage renal disease: Secondary | ICD-10-CM | POA: Diagnosis not present

## 2021-04-05 DIAGNOSIS — N2581 Secondary hyperparathyroidism of renal origin: Secondary | ICD-10-CM | POA: Diagnosis not present

## 2021-04-07 DIAGNOSIS — Z01810 Encounter for preprocedural cardiovascular examination: Secondary | ICD-10-CM | POA: Diagnosis not present

## 2021-04-07 DIAGNOSIS — N2581 Secondary hyperparathyroidism of renal origin: Secondary | ICD-10-CM | POA: Diagnosis not present

## 2021-04-07 DIAGNOSIS — N186 End stage renal disease: Secondary | ICD-10-CM | POA: Diagnosis not present

## 2021-04-07 DIAGNOSIS — R931 Abnormal findings on diagnostic imaging of heart and coronary circulation: Secondary | ICD-10-CM | POA: Diagnosis not present

## 2021-04-07 DIAGNOSIS — Z992 Dependence on renal dialysis: Secondary | ICD-10-CM | POA: Diagnosis not present

## 2021-04-08 DIAGNOSIS — Z992 Dependence on renal dialysis: Secondary | ICD-10-CM | POA: Diagnosis not present

## 2021-04-08 DIAGNOSIS — N186 End stage renal disease: Secondary | ICD-10-CM | POA: Diagnosis not present

## 2021-04-08 DIAGNOSIS — N2581 Secondary hyperparathyroidism of renal origin: Secondary | ICD-10-CM | POA: Diagnosis not present

## 2021-04-10 DIAGNOSIS — N186 End stage renal disease: Secondary | ICD-10-CM | POA: Diagnosis not present

## 2021-04-10 DIAGNOSIS — N2581 Secondary hyperparathyroidism of renal origin: Secondary | ICD-10-CM | POA: Diagnosis not present

## 2021-04-10 DIAGNOSIS — Z992 Dependence on renal dialysis: Secondary | ICD-10-CM | POA: Diagnosis not present

## 2021-04-11 DIAGNOSIS — I110 Hypertensive heart disease with heart failure: Secondary | ICD-10-CM | POA: Diagnosis not present

## 2021-04-11 DIAGNOSIS — N186 End stage renal disease: Secondary | ICD-10-CM | POA: Diagnosis not present

## 2021-04-11 DIAGNOSIS — E1122 Type 2 diabetes mellitus with diabetic chronic kidney disease: Secondary | ICD-10-CM | POA: Diagnosis not present

## 2021-04-11 DIAGNOSIS — I5020 Unspecified systolic (congestive) heart failure: Secondary | ICD-10-CM | POA: Diagnosis not present

## 2021-04-12 DIAGNOSIS — N2581 Secondary hyperparathyroidism of renal origin: Secondary | ICD-10-CM | POA: Diagnosis not present

## 2021-04-12 DIAGNOSIS — E1022 Type 1 diabetes mellitus with diabetic chronic kidney disease: Secondary | ICD-10-CM | POA: Diagnosis not present

## 2021-04-12 DIAGNOSIS — N186 End stage renal disease: Secondary | ICD-10-CM | POA: Diagnosis not present

## 2021-04-12 DIAGNOSIS — Z992 Dependence on renal dialysis: Secondary | ICD-10-CM | POA: Diagnosis not present

## 2021-04-14 DIAGNOSIS — N2581 Secondary hyperparathyroidism of renal origin: Secondary | ICD-10-CM | POA: Diagnosis not present

## 2021-04-14 DIAGNOSIS — Z992 Dependence on renal dialysis: Secondary | ICD-10-CM | POA: Diagnosis not present

## 2021-04-14 DIAGNOSIS — N186 End stage renal disease: Secondary | ICD-10-CM | POA: Diagnosis not present

## 2021-04-17 DIAGNOSIS — N2581 Secondary hyperparathyroidism of renal origin: Secondary | ICD-10-CM | POA: Diagnosis not present

## 2021-04-17 DIAGNOSIS — N186 End stage renal disease: Secondary | ICD-10-CM | POA: Diagnosis not present

## 2021-04-17 DIAGNOSIS — Z992 Dependence on renal dialysis: Secondary | ICD-10-CM | POA: Diagnosis not present

## 2021-04-18 ENCOUNTER — Other Ambulatory Visit: Payer: Self-pay | Admitting: Internal Medicine

## 2021-04-19 ENCOUNTER — Ambulatory Visit (INDEPENDENT_AMBULATORY_CARE_PROVIDER_SITE_OTHER): Payer: Federal, State, Local not specified - PPO | Admitting: Physician Assistant

## 2021-04-19 ENCOUNTER — Encounter: Payer: Self-pay | Admitting: Physician Assistant

## 2021-04-19 VITALS — BP 130/76 | HR 82 | Ht 66.0 in | Wt 212.0 lb

## 2021-04-19 DIAGNOSIS — K219 Gastro-esophageal reflux disease without esophagitis: Secondary | ICD-10-CM

## 2021-04-19 DIAGNOSIS — Z992 Dependence on renal dialysis: Secondary | ICD-10-CM | POA: Diagnosis not present

## 2021-04-19 DIAGNOSIS — N2581 Secondary hyperparathyroidism of renal origin: Secondary | ICD-10-CM | POA: Diagnosis not present

## 2021-04-19 DIAGNOSIS — N186 End stage renal disease: Secondary | ICD-10-CM | POA: Diagnosis not present

## 2021-04-19 DIAGNOSIS — K588 Other irritable bowel syndrome: Secondary | ICD-10-CM

## 2021-04-19 MED ORDER — OMEPRAZOLE 40 MG PO CPDR: 40.0000 mg | DELAYED_RELEASE_CAPSULE | Freq: Two times a day (BID) | ORAL | 3 refills | Status: AC

## 2021-04-19 MED ORDER — DICYCLOMINE HCL 20 MG PO TABS: 20.0000 mg | ORAL_TABLET | Freq: Two times a day (BID) | ORAL | 3 refills | Status: DC

## 2021-04-19 NOTE — Progress Notes (Signed)
Reviewed and agree with management plan.  Crosby Oriordan T. Goble Fudala, MD FACG 

## 2021-04-19 NOTE — Progress Notes (Signed)
Chief Complaint: Refill medications  HPI:    Hector Mahoney is a 50 year old African-American male with a past medical history of reflux, IBS and IDA, as well as ESRD on dialysis, diabetes and CHF, known to Dr. Fuller Plan, who presents to clinic today for refill of his medications.    10/2018 EGD with a normal esophagus, nonbleeding erosive gastropathy, biopsy showing reactive gastropathy, erythematous duodenopathy and normal second portion of the duodenum.    11/2018 colonoscopy with 1 8 mm polyp in the descending colon pathology showed tubular adenoma, internal hemorrhoids and otherwise normal.  Repeat recommended in 7 years.    11/26/2019 patient seen in clinic for follow-up and described that his reflux symptoms were well controlled, he had some difficulties with postprandial nausea, rumbling and discomfort generally after his evening meal and has been taking dicyclomine 20 mg before all meals.  His iron is being monitored by his PCP in the dialysis center.  Did describe receiving iron intermittently at dialysis.  At that time patient was continued on Omeprazole 40 mg p.o. twice daily.  Dicyclomine was discontinued and he was started on Glycopyrrolate 2 mg p.o. twice daily.    Today, the patient presents to clinic and explains that he is doing well.  He never started the [place]/Glycopyrrolate or at least does not remember and has continued on Dicyclomine 20 mg twice daily, every morning and nightly.  Tells me he has no complaints.  Has had some breakthrough Reflux symptoms since being off Omeprazole 40 mg twice daily over the past month or so after running out.  He has been using over-the-counter Omeprazole but this has not been helping his symptoms as much as he would like.    Does describe his frustrations as he was on the kidney transplant list but then got COVID and it decreased his cardiovascular function and now he has to wait longer.    Denies fever, chills, weight loss, blood in his stool, change  in bowel habits, abdominal pain, nausea or vomiting.  Past Medical History:  Diagnosis Date   Anemia    CHF (congestive heart failure) ([place])    DIABETES MELLITUS, TYPE I 07/30/2007   Esophageal reflux 04/27/2009   ESRD (end stage renal disease) on dialysis [facility])    home HD 4x/week   HYPERLIPIDEMIA 04/24/2007   HYPERTENSION 04/24/2007    Past Surgical History:  Procedure Laterality Date   AV FISTULA PLACEMENT Left 06/04/2019   Procedure: BRACHIAL-CEPHALIC ARTERIOVENOUS (AV) FISTULA CREATION LEFT ARM;  Surgeon: Rosetta Posner, MD;  Location: [facility] OR;  Service: Vascular;  Laterality: Left;   COLONOSCOPY     polyp   IR FLUORO GUIDE CV LINE RIGHT  10/22/2017   IR US GUIDE VASC ACCESS RIGHT  10/22/2017   MOUTH SURGERY     tooth ext    Current Outpatient Medications  Medication Sig Dispense Refill   acetaminophen (TYLENOL) 500 MG tablet Take 500 mg by mouth every 6 (six) hours as needed for mild pain.     albuterol (VENTOLIN HFA) 108 (90 Base) MCG/ACT inhaler Inhale 2 puffs into the lungs every 6 (six) hours as needed for wheezing or shortness of breath. 8 g 0   amLODipine (NORVASC) 5 MG tablet Take 1 tablet (5 mg total) by mouth at bedtime. 90 tablet 3   aspirin EC 81 MG EC tablet Take 1 tablet (81 mg total) by mouth daily. 30 tablet 0   atorvastatin (LIPITOR) 80 MG tablet Take 80 mg by mouth daily.  calcitRIOL (ROCALTROL) 0.25 MCG capsule Take 1 capsule (0.25 mcg total) by mouth every Monday, Wednesday, and Friday at 6 PM. 15 capsule 0   calcium acetate (PHOSLO) 667 MG capsule Take 3 capsules (2,001 mg total) by mouth 3 (three) times daily with meals. 90 capsule 11   cetirizine (ZYRTEC) 10 MG tablet Take 1 tablet by mouth once daily 30 tablet 0   cinacalcet (SENSIPAR) 30 MG tablet Take 60 mg by mouth every evening.     Continuous Blood Gluc Sensor (FREESTYLE LIBRE 14 DAY SENSOR) MISC 1 each by Other route daily.     fexofenadine (ALLEGRA) 180 MG tablet Take 1 tablet (180 mg total) by  mouth daily. 90 tablet 3   gabapentin (NEURONTIN) 300 MG capsule TAKE 1 TO 2 CAPSULES BY MOUTH AT BEDTIME AS NEEDED 180 capsule 1   glycopyrrolate (ROBINUL) 2 MG tablet Take 1 tablet (2 mg total) by mouth 2 (two) times daily. Please schedule a yearly follow up for further refills. Thank you 60 tablet 0   heparin 1000 unit/mL SOLN injection 1,000 Units by Dialysis route one time in dialysis. On Dialysis days (4 times per week)     insulin aspart (NOVOLOG) 100 UNIT/ML injection INJECT 8 TO 12 UNITS INTO THE SKIN THREE TIMES DAILY WITH MEALS (Patient taking differently: Inject 8-12 Units into the skin 3 (three) times daily with meals.) 10 mL 0   insulin glargine (LANTUS) 100 UNIT/ML injection Inject 0.25 mLs (25 Units total) into the skin at bedtime. 20 mL 5   levETIRAcetam (KEPPRA) 250 MG tablet Take 1 tablet (250 mg total) by mouth at bedtime. 90 tablet 0   meclizine (ANTIVERT) 12.5 MG tablet TAKE 1 TABLET BY MOUTH THREE TIMES DAILY AS NEEDED FOR DIZZINESS (Patient taking differently: Take 12.5 mg by mouth 3 (three) times daily as needed for dizziness.) 40 tablet 0   multivitamin (RENA-VIT) TABS tablet Take 1 tablet by mouth daily.     omeprazole (PRILOSEC) 40 MG capsule Take 1 capsule (40 mg total) by mouth 2 (two) times daily. 60 capsule 11   ondansetron (ZOFRAN) 4 MG tablet Take 1 tablet (4 mg total) by mouth every 8 (eight) hours as needed for nausea or vomiting. 30 tablet 0   polyethylene glycol powder (GLYCOLAX/MIRALAX) 17 GM/SCOOP powder Take 1 Container by mouth daily as needed for mild constipation.     predniSONE (DELTASONE) 10 MG tablet Take 40 mg daily for 1 day, 30 mg daily for 1 day, 20 mg daily for 1 days,10 mg daily for 1 day, then stop 10 tablet 0   rOPINIRole (REQUIP) 1 MG tablet Take 1 mg by mouth at bedtime.     rosuvastatin (CRESTOR) 10 MG tablet Take 1 tablet (10 mg total) by mouth daily. 30 tablet 3   triamcinolone (NASACORT) 55 MCG/ACT AERO nasal inhaler Place 2 sprays into the  nose daily. (Patient taking differently: Place 2 sprays into the nose daily as needed (allergies).) 1 each 0   No current facility-administered medications for this visit.    Allergies as of 04/19/2021   (No Known Allergies)    Family History  Problem Relation Age of Onset   Hypertension Mother    Diabetes Mother    Diabetes Maternal Aunt    Lung cancer Maternal Grandfather    Colon polyps Maternal Uncle    Colon cancer Maternal Uncle    Heart disease Maternal Uncle    Kidney disease Neg Hx    Esophageal cancer Neg Hx  Rectal cancer Neg Hx    Stomach cancer Neg Hx     Social History   Socioeconomic History   Marital status: Married    Spouse name: Not on file   Number of children: 0   Years of education: Not on file   Highest education level: Not on file  Occupational History   Not on file  Tobacco Use   Smoking status: Former    Types: Cigarettes    Quit date: 05/03/2015    Years since quitting: 5.9   Smokeless tobacco: Never   Tobacco comments:    12/22/2012 "used to smoke cigarettes once or twice/month; haven't had any cigarettes for years"  Vaping Use   Vaping Use: Never used  Substance and Sexual Activity   Alcohol use: Yes    Comment: occasional    Drug use: No   Sexual activity: Yes  Other Topics Concern   Not on file  Social History Narrative   Right Handed   Lives in a one story home   Drinks little caffeine    Social Determinants of Health   Financial Resource Strain: Not on file  Food Insecurity: Not on file  Transportation Needs: Not on file  Physical Activity: Not on file  Stress: Not on file  Social Connections: Not on file  Intimate Partner Violence: Not on file    Review of Systems:    Constitutional: No weight loss, fever or chills Cardiovascular: No chest pain Respiratory: No SOB  Gastrointestinal: See HPI and otherwise negative   Physical Exam:  Vital signs: BP 130/76   Pulse 82   Ht 5\' 6"  (1.676 m)   Wt 212 lb (96.2 kg)    BMI 34.22 kg/m    Constitutional:   Pleasant overweight AA male appears to be in NAD, Well developed, Well nourished, alert and cooperative Respiratory: Respirations even and unlabored. Lungs clear to auscultation bilaterally.   No wheezes, crackles, or rhonchi.  Cardiovascular: Normal S1, S2. No MRG. Regular rate and rhythm. No peripheral edema, cyanosis or pallor.  Gastrointestinal:  Soft, nondistended, nontender. No rebound or guarding. Normal bowel sounds. No appreciable masses or hepatomegaly. Rectal:  Not performed.  Psychiatric:  Demonstrates good judgement and reason without abnormal affect or behaviors.  RELEVANT LABS AND IMAGING: CBC    Component Value Date/Time   WBC 11.3 (H) 02/14/2021 1254   RBC 4.52 02/14/2021 1254   HGB 11.8 (L) 02/14/2021 1254   HCT 37.4 (L) 02/14/2021 1254   PLT 267 02/14/2021 1254   MCV 82.7 02/14/2021 1254   MCH 26.1 02/14/2021 1254   MCHC 31.6 02/14/2021 1254   RDW 15.6 (H) 02/14/2021 1254   LYMPHSABS 1.1 12/25/2020 0213   MONOABS 0.8 12/25/2020 0213   EOSABS 0.1 12/25/2020 0213   BASOSABS 0.1 12/25/2020 0213    CMP     Component Value Date/Time   NA 133 (L) 02/14/2021 1254   K 6.7 (HH) 02/14/2021 1254   CL 93 (L) 02/14/2021 1254   CO2 22 02/14/2021 1254   GLUCOSE 130 (H) 02/14/2021 1254   BUN 122 (H) 02/14/2021 1254   CREATININE 18.42 (H) 02/14/2021 1254   CALCIUM 9.2 02/14/2021 1254   CALCIUM 7.5 (L) 09/27/2017 1449   PROT 7.4 12/25/2020 0213   ALBUMIN 3.0 (L) 12/25/2020 0213   AST 15 12/25/2020 0213   ALT 23 12/25/2020 0213   ALKPHOS 80 12/25/2020 0213   BILITOT 0.5 12/25/2020 0213   GFRNONAA 3 (L) 02/14/2021 1254   GFRAA 5 (  L) 07/20/2018 0039    Assessment: 1.  GERD: Controlled on Omeprazole 40 mg twice daily 2.  IBS: Controlled on Dicyclomine 20 mg twice daily  Plan: 1.  Refilled Omeprazole 40 mg twice daily, 30-60 minutes before breakfast and dinner.  #180 with 3 refills. 2.  Refill Dicyclomine 20 mg twice daily,  every morning and nightly #180 with 3 refills. 3.  Patient to follow in clinic with [place] next year for further refills.  Sooner than that if having problems.  Hector Newer, PA-C [facility] 04/19/2021, 8:58 AM  Cc: Biagio Borg, MD

## 2021-04-19 NOTE — Patient Instructions (Addendum)
We have sent the following medications to your pharmacy for you to pick up at your convenience: Dicyclomine and Omeprazole.  Follow up in 1 year or sooner if needed.  If you are age 48 or older, your body mass index should be between 23-30. Your Body mass index is 34.22 kg/m. If this is out of the aforementioned range listed, please consider follow up with your Primary Care Provider.  If you are age 48 or younger, your body mass index should be between 19-25. Your Body mass index is 34.22 kg/m. If this is out of the aformentioned range listed, please consider follow up with your Primary Care Provider.   __________________________________________________________  The [facility] providers would like to encourage you to use [facility] to communicate with providers for non-urgent requests or questions.  Due to long hold times on the telephone, sending your provider a message by [facility] may be a faster and more efficient way to get a response.  Please allow 48 business hours for a response.  Please remember that this is for non-urgent requests.

## 2021-04-20 ENCOUNTER — Ambulatory Visit (INDEPENDENT_AMBULATORY_CARE_PROVIDER_SITE_OTHER): Payer: Federal, State, Local not specified - PPO | Admitting: Internal Medicine

## 2021-04-20 ENCOUNTER — Other Ambulatory Visit: Payer: Self-pay

## 2021-04-20 ENCOUNTER — Encounter: Payer: Self-pay | Admitting: Internal Medicine

## 2021-04-20 VITALS — BP 130/80 | HR 96 | Temp 98.3°F | Ht 66.0 in | Wt 208.0 lb

## 2021-04-20 DIAGNOSIS — E785 Hyperlipidemia, unspecified: Secondary | ICD-10-CM

## 2021-04-20 DIAGNOSIS — G2581 Restless legs syndrome: Secondary | ICD-10-CM

## 2021-04-20 DIAGNOSIS — H538 Other visual disturbances: Secondary | ICD-10-CM

## 2021-04-20 DIAGNOSIS — Z992 Dependence on renal dialysis: Secondary | ICD-10-CM

## 2021-04-20 DIAGNOSIS — N185 Chronic kidney disease, stage 5: Secondary | ICD-10-CM

## 2021-04-20 DIAGNOSIS — E1022 Type 1 diabetes mellitus with diabetic chronic kidney disease: Secondary | ICD-10-CM

## 2021-04-20 DIAGNOSIS — Z0001 Encounter for general adult medical examination with abnormal findings: Secondary | ICD-10-CM | POA: Diagnosis not present

## 2021-04-20 DIAGNOSIS — I10 Essential (primary) hypertension: Secondary | ICD-10-CM

## 2021-04-20 DIAGNOSIS — N186 End stage renal disease: Secondary | ICD-10-CM | POA: Diagnosis not present

## 2021-04-20 MED ORDER — LEVETIRACETAM 250 MG PO TABS: 250.0000 mg | ORAL_TABLET | Freq: Every day | ORAL | 3 refills | Status: DC

## 2021-04-20 NOTE — Patient Instructions (Addendum)
You will be contacted regarding the referral for: [facility]  Please continue all other medications as before, and refills have been done if requested - the keppra  Please have the pharmacy call with any other refills you may need.  Please continue your efforts at being more active, low cholesterol diet, and weight control.  You are otherwise up to date with prevention measures today.  Please keep your appointments with your specialists as you may have planned  Please make an Appointment to return in 6 months, or sooner if needed

## 2021-04-20 NOTE — Progress Notes (Signed)
Patient ID: Hector Mahoney, male   DOB: 31-May-1971, 50 y.o.   MRN: [id number]         Chief Complaint:: wellness exam and Office Visit (Discuss referral to eye doctor and muscle spasms)        HPI:  Hector Mahoney is a 50 y.o. male here for wellness exam; declines covid booster, tdap, flu shot hep c screen, ow up to date with preventive referrals and immunizations. Does need optho appt for known cataracts                        Also now on HD at center, no longer at home as support system did not work out.  Needs to restart keppra done originally per Dr Posey Pronto for control of involunatry movements of extremity  Pt denies chest pain, increased sob or doe, wheezing, orthopnea, PND, increased LE swelling, palpitations, dizziness or syncope.   Pt denies polydipsia, polyuria, or new focal neuro s/s.   Pt denies fever, wt loss, night sweats, loss of appetite, or other constitutional symptoms     Wt Readings from Last 3 Encounters:  04/20/21 208 lb (94.3 kg)  04/19/21 212 lb (96.2 kg)  02/14/21 (P) 201 lb 1 oz (91.2 kg)   BP Readings from Last 3 Encounters:  04/20/21 130/80  04/19/21 130/76  02/14/21 120/74   Immunization History  Administered Date(s) Administered   Hepatitis B, adult 01/16/2018, 02/17/2018, 05/21/2018, 07/14/2018   Influenza,inj,Quad PF,6+ Mos 07/31/2019   Influenza-Unspecified 06/16/2019, 05/06/2020   PFIZER(Purple Top)SARS-COV-2 Vaccination 09/08/2019, 09/30/2019, 06/16/2020   Pneumococcal Conjugate-13 11/13/2019   Pneumococcal Polysaccharide-23 11/08/2011, 01/13/2020   Td 08/15/1995   Tetanus 08/15/1995   Health Maintenance Due  Topic Date Due   OPHTHALMOLOGY EXAM  05/13/2020      Past Medical History:  Diagnosis Date   Anemia    CHF (congestive heart failure) ([place])    DIABETES MELLITUS, TYPE I 07/30/2007   Esophageal reflux 04/27/2009   ESRD (end stage renal disease) on dialysis [facility])    home HD 4x/week   HYPERLIPIDEMIA 04/24/2007   HYPERTENSION  04/24/2007   Past Surgical History:  Procedure Laterality Date   AV FISTULA PLACEMENT Left 06/04/2019   Procedure: BRACHIAL-CEPHALIC ARTERIOVENOUS (AV) FISTULA CREATION LEFT ARM;  Surgeon: Rosetta Posner, MD;  Location: [facility] OR;  Service: Vascular;  Laterality: Left;   COLONOSCOPY     polyp   IR FLUORO GUIDE CV LINE RIGHT  10/22/2017   IR THROMBECTOMY AV FISTULA W/THROMBOLYSIS/PTA/STENT INC/SHUNT/IMG LT Left 03/2021   IR US GUIDE VASC ACCESS RIGHT  10/22/2017   MOUTH SURGERY     tooth ext    reports that he quit smoking about 5 years ago. His smoking use included cigarettes. He has never used smokeless tobacco. He reports current alcohol use. He reports that he does not use drugs. family history includes Colon cancer in his maternal uncle; Colon polyps in his maternal uncle; Diabetes in his maternal aunt and mother; Heart disease in his maternal uncle; Hypertension in his mother; Lung cancer in his maternal grandfather. No Known Allergies Current Outpatient Medications on File Prior to Visit  Medication Sig Dispense Refill   acetaminophen (TYLENOL) 500 MG tablet Take 500 mg by mouth every 6 (six) hours as needed for mild pain.     albuterol (VENTOLIN HFA) 108 (90 Base) MCG/ACT inhaler Inhale 2 puffs into the lungs every 6 (six) hours as needed for wheezing or shortness of breath. 8 g  0   aspirin 81 MG EC tablet Take by mouth.     atorvastatin (LIPITOR) 80 MG tablet Take 80 mg by mouth daily.     AURYXIA 1 GM 210 MG(Fe) tablet Take 420 mg by mouth 3 (three) times daily.     calcitRIOL (ROCALTROL) 0.25 MCG capsule Take 1 capsule (0.25 mcg total) by mouth every Monday, Wednesday, and Friday at 6 PM. 15 capsule 0   cetirizine (ZYRTEC) 10 MG tablet Take 1 tablet by mouth once daily 30 tablet 0   cinacalcet (SENSIPAR) 30 MG tablet Take 60 mg by mouth every evening.     Continuous Blood Gluc Sensor (FREESTYLE LIBRE 14 DAY SENSOR) MISC 1 each by Other route daily.     dicyclomine (BENTYL) 20 MG  tablet Take 1 tablet (20 mg total) by mouth in the morning and at bedtime. 180 tablet 3   gabapentin (NEURONTIN) 300 MG capsule TAKE 1 TO 2 CAPSULES BY MOUTH AT BEDTIME AS NEEDED 180 capsule 1   heparin 1000 unit/mL SOLN injection 1,000 Units by Dialysis route one time in dialysis. On Dialysis days (4 times per week)     insulin aspart (NOVOLOG) 100 UNIT/ML injection INJECT 8 TO 12 UNITS INTO THE SKIN THREE TIMES DAILY WITH MEALS (Patient taking differently: Inject 8-12 Units into the skin 3 (three) times daily with meals.) 10 mL 0   insulin glargine (LANTUS) 100 UNIT/ML injection Inject 0.25 mLs (25 Units total) into the skin at bedtime. 20 mL 5   losartan (COZAAR) 50 MG tablet Take 1 tablet by mouth daily.     meclizine (ANTIVERT) 12.5 MG tablet TAKE 1 TABLET BY MOUTH THREE TIMES DAILY AS NEEDED FOR DIZZINESS (Patient taking differently: Take 12.5 mg by mouth 3 (three) times daily as needed for dizziness.) 40 tablet 0   metoprolol succinate (TOPROL-XL) 25 MG 24 hr tablet Take 25 mg by mouth daily.     Molnupiravir 200 MG CAPS Take 4 capsules by mouth 2 (two) times daily.     multivitamin (RENA-VIT) TABS tablet Take 1 tablet by mouth daily.     NORVASC 10 MG tablet Take 10 mg by mouth at bedtime.     Nutritional Supplements (VITAMIN D BOOSTER PO) Take 1 mg by mouth 3 (three) times a week.     omeprazole (PRILOSEC) 40 MG capsule Take 1 capsule (40 mg total) by mouth 2 (two) times daily. 180 capsule 3   ondansetron (ZOFRAN) 4 MG tablet Take 1 tablet (4 mg total) by mouth every 8 (eight) hours as needed for nausea or vomiting. 30 tablet 0   polyethylene glycol powder (GLYCOLAX/MIRALAX) 17 GM/SCOOP powder Take 1 Container by mouth daily as needed for mild constipation.     predniSONE (DELTASONE) 10 MG tablet Take 40 mg daily for 1 day, 30 mg daily for 1 day, 20 mg daily for 1 days,10 mg daily for 1 day, then stop 10 tablet 0   rOPINIRole (REQUIP) 1 MG tablet Take 1 mg by mouth at bedtime.      rosuvastatin (CRESTOR) 10 MG tablet Take 1 tablet (10 mg total) by mouth daily. 30 tablet 3   triamcinolone (NASACORT) 55 MCG/ACT AERO nasal inhaler Place 2 sprays into the nose daily. (Patient taking differently: Place 2 sprays into the nose daily as needed (allergies).) 1 each 0   Tuberculin PPD (TUBERSOL ID) Inject 1 mg into the skin.     fexofenadine (ALLEGRA) 180 MG tablet Take 1 tablet (180 mg total) by mouth  daily. 90 tablet 3   No current facility-administered medications on file prior to visit.        ROS:  All others reviewed and negative.  Objective        PE:  BP 130/80 (BP Location: Right Arm, Patient Position: Sitting, Cuff Size: Normal)   Pulse 96   Temp 98.3 F (36.8 C) (Oral)   Ht 5\' 6"  (1.676 m)   Wt 208 lb (94.3 kg)   SpO2 100%   BMI 33.57 kg/m                 Constitutional: Pt appears in NAD               HENT: Head: NCAT.                Right Ear: External ear normal.                 Left Ear: External ear normal.                Eyes: . Pupils are equal, round, and reactive to light. Conjunctivae and EOM are normal               Nose: without d/c or deformity               Neck: Neck supple. Gross normal ROM               Cardiovascular: Normal rate and regular rhythm.                 Pulmonary/Chest: Effort normal and breath sounds without rales or wheezing.                Abd:  Soft, NT, ND, + BS, no organomegaly               Neurological: Pt is alert. At baseline orientation, motor grossly intact               Skin: Skin is warm. No rashes, no other new lesions, LE edema - none               Psychiatric: Pt behavior is normal without agitation   Micro: none  Cardiac tracings I have personally interpreted today:  none  Pertinent Radiological findings (summarize): none   Lab Results  Component Value Date   WBC 11.3 (H) 02/14/2021   HGB 11.8 (L) 02/14/2021   HCT 37.4 (L) 02/14/2021   PLT 267 02/14/2021   GLUCOSE 130 (H) 02/14/2021   CHOL 183  06/25/2019   TRIG 214.0 (H) 06/25/2019   HDL 58.40 06/25/2019   LDLDIRECT 80.0 06/25/2019   ALT 23 12/25/2020   AST 15 12/25/2020   NA 133 (L) 02/14/2021   K 6.7 (HH) 02/14/2021   CL 93 (L) 02/14/2021   CREATININE 18.42 (H) 02/14/2021   BUN 122 (H) 02/14/2021   CO2 22 02/14/2021   TSH 2.03 06/25/2019   PSA 1.39 06/25/2019   INR 1.05 10/21/2017   HGBA1C 7.3 (H) 12/22/2020   Assessment/Plan:  Isiah L Bierlein is a 50 y.o. Black or African American [2] male with  has a past medical history of Anemia, CHF (congestive heart failure) ([place]), DIABETES MELLITUS, TYPE I (07/30/2007), Esophageal reflux (04/27/2009), ESRD (end stage renal disease) on dialysis ([place]), HYPERLIPIDEMIA (04/24/2007), and HYPERTENSION (04/24/2007).  Encounter for well adult exam with abnormal findings Age and sex appropriate education and counseling updated with regular exercise and diet Referrals for preventative  services - declines hep c screen Immunizations addressed - declines covid booster, tdap, flu shot,  Smoking counseling  - none needed Evidence for depression or other mood disorder - none significant Most recent labs reviewed. I have personally reviewed and have noted: 1) the patient's medical and social history 2) The patient's current medications and supplements 3) The patient's height, weight, and BMI have been recorded in the chart   RLS (restless legs syndrome) Uncontrolled, for restart keppra asd,  to f/u any worsening symptoms or concerns  Hazy vision With known cataracts - for optho referral,  to f/u any worsening symptoms or concerns  Type 1 diabetes mellitus ([place]) For close f/u with [place] [facility],  to f/u any worsening symptoms or concerns  ESRD on hemodialysis ([place]) Now changed from home HD to clinic chair - cont as planned  Essential hypertension BP Readings from Last 3 Encounters:  04/20/21 130/80  04/19/21 130/76  02/14/21 120/74   Stable, pt to continue medical treatment  losartan, toprol, amlodipine  Dyslipidemia No results found for: LDLCALC Stable, pt to continue current lipitor 80, declines lab here, to continue lab f/u at [facility]  Followup: Return in about 6 months (around 10/18/2021).  Cathlean Cower, MD 04/23/2021 6:54 PM [facility]

## 2021-04-21 DIAGNOSIS — N2581 Secondary hyperparathyroidism of renal origin: Secondary | ICD-10-CM | POA: Diagnosis not present

## 2021-04-21 DIAGNOSIS — N186 End stage renal disease: Secondary | ICD-10-CM | POA: Diagnosis not present

## 2021-04-21 DIAGNOSIS — Z992 Dependence on renal dialysis: Secondary | ICD-10-CM | POA: Diagnosis not present

## 2021-04-23 ENCOUNTER — Encounter: Payer: Self-pay | Admitting: Internal Medicine

## 2021-04-23 DIAGNOSIS — H538 Other visual disturbances: Secondary | ICD-10-CM | POA: Insufficient documentation

## 2021-04-23 NOTE — Assessment & Plan Note (Signed)
With known cataracts - for optho referral,  to f/u any worsening symptoms or concerns

## 2021-04-23 NOTE — Assessment & Plan Note (Signed)
Age and sex appropriate education and counseling updated with regular exercise and diet Referrals for preventative services - declines hep c screen Immunizations addressed - declines covid booster, tdap, flu shot,  Smoking counseling  - none needed Evidence for depression or other mood disorder - none significant Most recent labs reviewed. I have personally reviewed and have noted: 1) the patient's medical and social history 2) The patient's current medications and supplements 3) The patient's height, weight, and BMI have been recorded in the chart

## 2021-04-23 NOTE — Assessment & Plan Note (Signed)
BP Readings from Last 3 Encounters:  04/20/21 130/80  04/19/21 130/76  02/14/21 120/74   Stable, pt to continue medical treatment losartan, toprol, amlodipine

## 2021-04-23 NOTE — Assessment & Plan Note (Signed)
Uncontrolled, for restart keppra asd,  to f/u any worsening symptoms or concerns

## 2021-04-23 NOTE — Assessment & Plan Note (Signed)
Now changed from home HD to clinic chair - cont as planned

## 2021-04-23 NOTE — Assessment & Plan Note (Signed)
For close f/u with [facility],  to f/u any worsening symptoms or concerns

## 2021-04-23 NOTE — Assessment & Plan Note (Signed)
No results found for: LDLCALC Stable, pt to continue current lipitor 80, declines lab here, to continue lab f/u at [facility]

## 2021-04-24 DIAGNOSIS — N186 End stage renal disease: Secondary | ICD-10-CM | POA: Diagnosis not present

## 2021-04-24 DIAGNOSIS — Z992 Dependence on renal dialysis: Secondary | ICD-10-CM | POA: Diagnosis not present

## 2021-04-24 DIAGNOSIS — N2581 Secondary hyperparathyroidism of renal origin: Secondary | ICD-10-CM | POA: Diagnosis not present

## 2021-04-26 DIAGNOSIS — Z992 Dependence on renal dialysis: Secondary | ICD-10-CM | POA: Diagnosis not present

## 2021-04-26 DIAGNOSIS — N2581 Secondary hyperparathyroidism of renal origin: Secondary | ICD-10-CM | POA: Diagnosis not present

## 2021-04-26 DIAGNOSIS — N186 End stage renal disease: Secondary | ICD-10-CM | POA: Diagnosis not present

## 2021-04-27 DIAGNOSIS — H25811 Combined forms of age-related cataract, right eye: Secondary | ICD-10-CM | POA: Diagnosis not present

## 2021-04-28 DIAGNOSIS — Z992 Dependence on renal dialysis: Secondary | ICD-10-CM | POA: Diagnosis not present

## 2021-04-28 DIAGNOSIS — N2581 Secondary hyperparathyroidism of renal origin: Secondary | ICD-10-CM | POA: Diagnosis not present

## 2021-04-28 DIAGNOSIS — N186 End stage renal disease: Secondary | ICD-10-CM | POA: Diagnosis not present

## 2021-05-01 DIAGNOSIS — N186 End stage renal disease: Secondary | ICD-10-CM | POA: Diagnosis not present

## 2021-05-01 DIAGNOSIS — N2581 Secondary hyperparathyroidism of renal origin: Secondary | ICD-10-CM | POA: Diagnosis not present

## 2021-05-01 DIAGNOSIS — Z992 Dependence on renal dialysis: Secondary | ICD-10-CM | POA: Diagnosis not present

## 2021-05-02 ENCOUNTER — Other Ambulatory Visit: Payer: Self-pay | Admitting: Gastroenterology

## 2021-05-03 DIAGNOSIS — N186 End stage renal disease: Secondary | ICD-10-CM | POA: Diagnosis not present

## 2021-05-03 DIAGNOSIS — N2581 Secondary hyperparathyroidism of renal origin: Secondary | ICD-10-CM | POA: Diagnosis not present

## 2021-05-03 DIAGNOSIS — Z992 Dependence on renal dialysis: Secondary | ICD-10-CM | POA: Diagnosis not present

## 2021-05-05 DIAGNOSIS — Z992 Dependence on renal dialysis: Secondary | ICD-10-CM | POA: Diagnosis not present

## 2021-05-05 DIAGNOSIS — N2581 Secondary hyperparathyroidism of renal origin: Secondary | ICD-10-CM | POA: Diagnosis not present

## 2021-05-05 DIAGNOSIS — N186 End stage renal disease: Secondary | ICD-10-CM | POA: Diagnosis not present

## 2021-05-08 DIAGNOSIS — H2511 Age-related nuclear cataract, right eye: Secondary | ICD-10-CM | POA: Diagnosis not present

## 2021-05-08 DIAGNOSIS — H25811 Combined forms of age-related cataract, right eye: Secondary | ICD-10-CM | POA: Diagnosis not present

## 2021-05-09 DIAGNOSIS — Z992 Dependence on renal dialysis: Secondary | ICD-10-CM | POA: Diagnosis not present

## 2021-05-09 DIAGNOSIS — N2581 Secondary hyperparathyroidism of renal origin: Secondary | ICD-10-CM | POA: Diagnosis not present

## 2021-05-09 DIAGNOSIS — N186 End stage renal disease: Secondary | ICD-10-CM | POA: Diagnosis not present

## 2021-05-10 DIAGNOSIS — N2581 Secondary hyperparathyroidism of renal origin: Secondary | ICD-10-CM | POA: Diagnosis not present

## 2021-05-10 DIAGNOSIS — N186 End stage renal disease: Secondary | ICD-10-CM | POA: Diagnosis not present

## 2021-05-10 DIAGNOSIS — Z992 Dependence on renal dialysis: Secondary | ICD-10-CM | POA: Diagnosis not present

## 2021-05-12 DIAGNOSIS — Z992 Dependence on renal dialysis: Secondary | ICD-10-CM | POA: Diagnosis not present

## 2021-05-12 DIAGNOSIS — N186 End stage renal disease: Secondary | ICD-10-CM | POA: Diagnosis not present

## 2021-05-12 DIAGNOSIS — E1022 Type 1 diabetes mellitus with diabetic chronic kidney disease: Secondary | ICD-10-CM | POA: Diagnosis not present

## 2021-05-12 DIAGNOSIS — N2581 Secondary hyperparathyroidism of renal origin: Secondary | ICD-10-CM | POA: Diagnosis not present

## 2021-05-16 DIAGNOSIS — N186 End stage renal disease: Secondary | ICD-10-CM | POA: Diagnosis not present

## 2021-05-16 DIAGNOSIS — Z992 Dependence on renal dialysis: Secondary | ICD-10-CM | POA: Diagnosis not present

## 2021-05-16 DIAGNOSIS — T82868A Thrombosis of vascular prosthetic devices, implants and grafts, initial encounter: Secondary | ICD-10-CM | POA: Diagnosis not present

## 2021-05-19 DIAGNOSIS — B349 Viral infection, unspecified: Secondary | ICD-10-CM | POA: Diagnosis not present

## 2021-05-19 DIAGNOSIS — R5383 Other fatigue: Secondary | ICD-10-CM | POA: Diagnosis not present

## 2021-05-19 DIAGNOSIS — R051 Acute cough: Secondary | ICD-10-CM | POA: Diagnosis not present

## 2021-05-19 DIAGNOSIS — R0981 Nasal congestion: Secondary | ICD-10-CM | POA: Diagnosis not present

## 2021-05-19 DIAGNOSIS — Z20822 Contact with and (suspected) exposure to covid-19: Secondary | ICD-10-CM | POA: Diagnosis not present

## 2021-05-23 DIAGNOSIS — Z992 Dependence on renal dialysis: Secondary | ICD-10-CM | POA: Diagnosis not present

## 2021-05-23 DIAGNOSIS — N2581 Secondary hyperparathyroidism of renal origin: Secondary | ICD-10-CM | POA: Diagnosis not present

## 2021-05-23 DIAGNOSIS — N186 End stage renal disease: Secondary | ICD-10-CM | POA: Diagnosis not present

## 2021-05-23 DIAGNOSIS — T8249XA Other complication of vascular dialysis catheter, initial encounter: Secondary | ICD-10-CM | POA: Diagnosis not present

## 2021-05-24 DIAGNOSIS — T8249XA Other complication of vascular dialysis catheter, initial encounter: Secondary | ICD-10-CM | POA: Diagnosis not present

## 2021-05-24 DIAGNOSIS — N186 End stage renal disease: Secondary | ICD-10-CM | POA: Diagnosis not present

## 2021-05-24 DIAGNOSIS — N2581 Secondary hyperparathyroidism of renal origin: Secondary | ICD-10-CM | POA: Diagnosis not present

## 2021-05-24 DIAGNOSIS — Z992 Dependence on renal dialysis: Secondary | ICD-10-CM | POA: Diagnosis not present

## 2021-05-26 DIAGNOSIS — T8249XA Other complication of vascular dialysis catheter, initial encounter: Secondary | ICD-10-CM | POA: Diagnosis not present

## 2021-05-26 DIAGNOSIS — N2581 Secondary hyperparathyroidism of renal origin: Secondary | ICD-10-CM | POA: Diagnosis not present

## 2021-05-26 DIAGNOSIS — N186 End stage renal disease: Secondary | ICD-10-CM | POA: Diagnosis not present

## 2021-05-26 DIAGNOSIS — Z992 Dependence on renal dialysis: Secondary | ICD-10-CM | POA: Diagnosis not present

## 2021-05-29 DIAGNOSIS — N2581 Secondary hyperparathyroidism of renal origin: Secondary | ICD-10-CM | POA: Diagnosis not present

## 2021-05-29 DIAGNOSIS — T8249XA Other complication of vascular dialysis catheter, initial encounter: Secondary | ICD-10-CM | POA: Diagnosis not present

## 2021-05-29 DIAGNOSIS — Z992 Dependence on renal dialysis: Secondary | ICD-10-CM | POA: Diagnosis not present

## 2021-05-29 DIAGNOSIS — N186 End stage renal disease: Secondary | ICD-10-CM | POA: Diagnosis not present

## 2021-05-30 DIAGNOSIS — I361 Nonrheumatic tricuspid (valve) insufficiency: Secondary | ICD-10-CM | POA: Diagnosis not present

## 2021-05-30 DIAGNOSIS — N186 End stage renal disease: Secondary | ICD-10-CM | POA: Diagnosis not present

## 2021-05-30 DIAGNOSIS — I340 Nonrheumatic mitral (valve) insufficiency: Secondary | ICD-10-CM | POA: Diagnosis not present

## 2021-05-31 DIAGNOSIS — N2581 Secondary hyperparathyroidism of renal origin: Secondary | ICD-10-CM | POA: Diagnosis not present

## 2021-05-31 DIAGNOSIS — T8249XA Other complication of vascular dialysis catheter, initial encounter: Secondary | ICD-10-CM | POA: Diagnosis not present

## 2021-05-31 DIAGNOSIS — N186 End stage renal disease: Secondary | ICD-10-CM | POA: Diagnosis not present

## 2021-05-31 DIAGNOSIS — Z992 Dependence on renal dialysis: Secondary | ICD-10-CM | POA: Diagnosis not present

## 2021-06-02 DIAGNOSIS — N186 End stage renal disease: Secondary | ICD-10-CM | POA: Diagnosis not present

## 2021-06-02 DIAGNOSIS — Z992 Dependence on renal dialysis: Secondary | ICD-10-CM | POA: Diagnosis not present

## 2021-06-02 DIAGNOSIS — T8249XA Other complication of vascular dialysis catheter, initial encounter: Secondary | ICD-10-CM | POA: Diagnosis not present

## 2021-06-02 DIAGNOSIS — N2581 Secondary hyperparathyroidism of renal origin: Secondary | ICD-10-CM | POA: Diagnosis not present

## 2021-06-05 DIAGNOSIS — N186 End stage renal disease: Secondary | ICD-10-CM | POA: Diagnosis not present

## 2021-06-05 DIAGNOSIS — Z992 Dependence on renal dialysis: Secondary | ICD-10-CM | POA: Diagnosis not present

## 2021-06-05 DIAGNOSIS — T8249XA Other complication of vascular dialysis catheter, initial encounter: Secondary | ICD-10-CM | POA: Diagnosis not present

## 2021-06-05 DIAGNOSIS — N2581 Secondary hyperparathyroidism of renal origin: Secondary | ICD-10-CM | POA: Diagnosis not present

## 2021-06-06 ENCOUNTER — Other Ambulatory Visit: Payer: Self-pay

## 2021-06-06 ENCOUNTER — Encounter: Payer: Self-pay | Admitting: Internal Medicine

## 2021-06-06 ENCOUNTER — Ambulatory Visit (INDEPENDENT_AMBULATORY_CARE_PROVIDER_SITE_OTHER): Payer: Federal, State, Local not specified - PPO | Admitting: Internal Medicine

## 2021-06-06 VITALS — BP 148/76 | HR 91 | Temp 98.6°F | Ht 66.0 in | Wt 212.0 lb

## 2021-06-06 DIAGNOSIS — I10 Essential (primary) hypertension: Secondary | ICD-10-CM | POA: Diagnosis not present

## 2021-06-06 DIAGNOSIS — E669 Obesity, unspecified: Secondary | ICD-10-CM

## 2021-06-06 DIAGNOSIS — E1022 Type 1 diabetes mellitus with diabetic chronic kidney disease: Secondary | ICD-10-CM | POA: Diagnosis not present

## 2021-06-06 DIAGNOSIS — U071 COVID-19: Secondary | ICD-10-CM

## 2021-06-06 DIAGNOSIS — Z992 Dependence on renal dialysis: Secondary | ICD-10-CM

## 2021-06-06 DIAGNOSIS — N186 End stage renal disease: Secondary | ICD-10-CM

## 2021-06-06 DIAGNOSIS — N185 Chronic kidney disease, stage 5: Secondary | ICD-10-CM

## 2021-06-06 MED ORDER — OZEMPIC (0.25 OR 0.5 MG/DOSE) 2 MG/1.5ML ~~LOC~~ SOPN: 0.5000 mg | PEN_INJECTOR | SUBCUTANEOUS | 3 refills | Status: DC

## 2021-06-06 NOTE — Patient Instructions (Addendum)
Please take all new medication as prescribed - the ozempic  Please continue all other medications as before, and refills have been done if requested.  Please have the pharmacy call with any other refills you may need.  Please continue your efforts at being more active, low cholesterol diet, and weight control.  Please keep your appointments with your specialists as you may have planned  Please make an Appointment to return in 3 months, or sooner if needed

## 2021-06-06 NOTE — Progress Notes (Signed)
Patient ID: Hector Mahoney, male   DOB: 06/05/1971, 50 y.o.   MRN: [id number]       Chief Complaint: follow up dm, obesity, cough post covid       HPI:  Hector Mahoney is a 50 y.o. male here overall doing ok, but unable to lose significant wt in last 2-3 yrs; being considered for kidney/pancreas transplant, did have recent covid infection and turned down at least temporarily as EF was mild reduced post covid but now improved again.  Also has persistent dry cough as well but Pt denies chest pain, increased sob or doe, wheezing, orthopnea, PND, increased LE swelling, palpitations, dizziness or syncope.   Pt denies polydipsia, polyuria, or new focal neuro s/s.  Did have recent covid neg home testing in the past wk.   Pt denies fever, wt loss, night sweats, loss of appetite, or other constitutional symptoms         Wt Readings from Last 3 Encounters:  06/06/21 212 lb (96.2 kg)  04/20/21 208 lb (94.3 kg)  04/19/21 212 lb (96.2 kg)   BP Readings from Last 3 Encounters:  06/06/21 (!) 148/76  04/20/21 130/80  04/19/21 130/76         Past Medical History:  Diagnosis Date   Anemia    CHF (congestive heart failure) ([place])    DIABETES MELLITUS, TYPE I 07/30/2007   Esophageal reflux 04/27/2009   ESRD (end stage renal disease) on dialysis [facility])    home HD 4x/week   HYPERLIPIDEMIA 04/24/2007   HYPERTENSION 04/24/2007   Past Surgical History:  Procedure Laterality Date   AV FISTULA PLACEMENT Left 06/04/2019   Procedure: BRACHIAL-CEPHALIC ARTERIOVENOUS (AV) FISTULA CREATION LEFT ARM;  Surgeon: Rosetta Posner, MD;  Location: [facility] OR;  Service: Vascular;  Laterality: Left;   COLONOSCOPY     polyp   IR FLUORO GUIDE CV LINE RIGHT  10/22/2017   IR THROMBECTOMY AV FISTULA W/THROMBOLYSIS/PTA/STENT INC/SHUNT/IMG LT Left 03/2021   IR US GUIDE VASC ACCESS RIGHT  10/22/2017   MOUTH SURGERY     tooth ext    reports that he quit smoking about 6 years ago. His smoking use included cigarettes. He has never  used smokeless tobacco. He reports current alcohol use. He reports that he does not use drugs. family history includes Colon cancer in his maternal uncle; Colon polyps in his maternal uncle; Diabetes in his maternal aunt and mother; Heart disease in his maternal uncle; Hypertension in his mother; Lung cancer in his maternal grandfather. No Known Allergies Current Outpatient Medications on File Prior to Visit  Medication Sig Dispense Refill   albuterol (VENTOLIN HFA) 108 (90 Base) MCG/ACT inhaler Inhale 2 puffs into the lungs every 6 (six) hours as needed for wheezing or shortness of breath. 8 g 0   aspirin 81 MG EC tablet Take by mouth.     atorvastatin (LIPITOR) 80 MG tablet Take 80 mg by mouth daily.     AURYXIA 1 GM 210 MG(Fe) tablet Take 420 mg by mouth 3 (three) times daily.     calcitRIOL (ROCALTROL) 0.25 MCG capsule Take 1 capsule (0.25 mcg total) by mouth every Monday, Wednesday, and Friday at 6 PM. 15 capsule 0   cetirizine (ZYRTEC) 10 MG tablet Take 1 tablet by mouth once daily 30 tablet 0   cinacalcet (SENSIPAR) 30 MG tablet Take 60 mg by mouth every evening.     Continuous Blood Gluc Sensor (FREESTYLE LIBRE 14 DAY SENSOR) MISC 1 each by Other  route daily.     dicyclomine (BENTYL) 20 MG tablet Take 1 tablet (20 mg total) by mouth in the morning and at bedtime. 180 tablet 3   gabapentin (NEURONTIN) 300 MG capsule TAKE 1 TO 2 CAPSULES BY MOUTH AT BEDTIME AS NEEDED 180 capsule 1   insulin aspart (NOVOLOG) 100 UNIT/ML injection INJECT 8 TO 12 UNITS INTO THE SKIN THREE TIMES DAILY WITH MEALS (Patient taking differently: Inject 8-12 Units into the skin 3 (three) times daily with meals.) 10 mL 0   insulin glargine (LANTUS) 100 UNIT/ML injection Inject 0.25 mLs (25 Units total) into the skin at bedtime. 20 mL 5   levETIRAcetam (KEPPRA) 250 MG tablet Take 1 tablet (250 mg total) by mouth at bedtime. 90 tablet 3   losartan (COZAAR) 50 MG tablet Take 1 tablet by mouth daily.     meclizine  (ANTIVERT) 12.5 MG tablet TAKE 1 TABLET BY MOUTH THREE TIMES DAILY AS NEEDED FOR DIZZINESS (Patient taking differently: Take 12.5 mg by mouth 3 (three) times daily as needed for dizziness.) 40 tablet 0   metoprolol succinate (TOPROL-XL) 25 MG 24 hr tablet Take 25 mg by mouth daily.     multivitamin (RENA-VIT) TABS tablet Take 1 tablet by mouth daily.     NORVASC 10 MG tablet Take 10 mg by mouth at bedtime.     Nutritional Supplements (VITAMIN D BOOSTER PO) Take 1 mg by mouth 3 (three) times a week.     omeprazole (PRILOSEC) 40 MG capsule Take 1 capsule (40 mg total) by mouth 2 (two) times daily. 180 capsule 3   ondansetron (ZOFRAN) 4 MG tablet Take 1 tablet (4 mg total) by mouth every 8 (eight) hours as needed for nausea or vomiting. 30 tablet 0   polyethylene glycol powder (GLYCOLAX/MIRALAX) 17 GM/SCOOP powder Take 1 Container by mouth daily as needed for mild constipation.     rOPINIRole (REQUIP) 1 MG tablet Take 1 mg by mouth at bedtime.     rosuvastatin (CRESTOR) 10 MG tablet Take 1 tablet (10 mg total) by mouth daily. 30 tablet 3   triamcinolone (NASACORT) 55 MCG/ACT AERO nasal inhaler Place 2 sprays into the nose daily. (Patient taking differently: Place 2 sprays into the nose daily as needed (allergies).) 1 each 0   Tuberculin PPD (TUBERSOL ID) Inject 1 mg into the skin.     fexofenadine (ALLEGRA) 180 MG tablet Take 1 tablet (180 mg total) by mouth daily. 90 tablet 3   No current facility-administered medications on file prior to visit.        ROS:  All others reviewed and negative.  Objective        PE:  BP (!) 148/76 (BP Location: Right Arm, Patient Position: Sitting, Cuff Size: Large)   Pulse 91   Temp 98.6 F (37 C) (Oral)   Ht 5\' 6"  (1.676 m)   Wt 212 lb (96.2 kg)   SpO2 98%   BMI 34.22 kg/m                 Constitutional: Pt appears in NAD               HENT: Head: NCAT.                Right Ear: External ear normal.                 Left Ear: External ear normal.  Eyes: . Pupils are equal, round, and reactive to light. Conjunctivae and EOM are normal               Nose: without d/c or deformity               Neck: Neck supple. Gross normal ROM               Cardiovascular: Normal rate and regular rhythm.                 Pulmonary/Chest: Effort normal and breath sounds without rales or wheezing.                Abd:  Soft, NT, ND, + BS, no organomegaly               Neurological: Pt is alert. At baseline orientation, motor grossly intact               Skin: Skin is warm. No rashes, no other new lesions, LE edema - none               Psychiatric: Pt behavior is normal without agitation   Micro: none  Cardiac tracings I have personally interpreted today:  none  Pertinent Radiological findings (summarize): none   Lab Results  Component Value Date   WBC 11.3 (H) 02/14/2021   HGB 11.8 (L) 02/14/2021   HCT 37.4 (L) 02/14/2021   PLT 267 02/14/2021   GLUCOSE 130 (H) 02/14/2021   CHOL 183 06/25/2019   TRIG 214.0 (H) 06/25/2019   HDL 58.40 06/25/2019   LDLDIRECT 80.0 06/25/2019   ALT 23 12/25/2020   AST 15 12/25/2020   NA 133 (L) 02/14/2021   K 6.7 (HH) 02/14/2021   CL 93 (L) 02/14/2021   CREATININE 18.42 (H) 02/14/2021   BUN 122 (H) 02/14/2021   CO2 22 02/14/2021   TSH 2.03 06/25/2019   PSA 1.39 06/25/2019   INR 1.05 10/21/2017   HGBA1C 7.3 (H) 12/22/2020   Assessment/Plan:  Hector Mahoney is a 50 y.o. Black or African American [2] male with  has a past medical history of Anemia, CHF (congestive heart failure) ([place]), DIABETES MELLITUS, TYPE I (07/30/2007), Esophageal reflux (04/27/2009), ESRD (end stage renal disease) on dialysis ([place]), HYPERLIPIDEMIA (04/24/2007), and HYPERTENSION (04/24/2007).  Obesity (BMI 30.0-34.9) With inabiity for loss with diet, exercise, now for trial ozempic asd,  to f/u any worsening symptoms or concerns  Type 1 diabetes mellitus (HCC) Chronic uncontrolled,  Lab Results  Component Value Date   HGBA1C  7.3 (H) 12/22/2020   HGBA1C 9.3 (H) 06/25/2019   HGBA1C 7.3 10/07/2017   Lab Results  Component Value Date   CREATININE 18.42 (H) 02/14/2021   For add ozempic,  to f/u any worsening symptoms or concerns  Essential hypertension BP Readings from Last 3 Encounters:  06/06/21 (!) 148/76  04/20/21 130/80  04/19/21 130/76  mild uncontrolled, but better controlled < 14090 per pt at home, pt to continue medical treatment losartan, amlodipine, toprol   ESRD on hemodialysis ([place]) Now on HD after initial try at home dialysis, cont f/u renal as planned  COVID-19 Recent essentially resolved except persistent mild dry cough, declines cxr  to f/u any worsening symptoms or concerns  Followup: Return in about 3 months (around 09/06/2021).  Cathlean Cower, MD 06/13/2021 3:00 PM [facility]

## 2021-06-07 DIAGNOSIS — T8249XA Other complication of vascular dialysis catheter, initial encounter: Secondary | ICD-10-CM | POA: Diagnosis not present

## 2021-06-07 DIAGNOSIS — N186 End stage renal disease: Secondary | ICD-10-CM | POA: Diagnosis not present

## 2021-06-07 DIAGNOSIS — Z992 Dependence on renal dialysis: Secondary | ICD-10-CM | POA: Diagnosis not present

## 2021-06-07 DIAGNOSIS — N2581 Secondary hyperparathyroidism of renal origin: Secondary | ICD-10-CM | POA: Diagnosis not present

## 2021-06-09 DIAGNOSIS — N2581 Secondary hyperparathyroidism of renal origin: Secondary | ICD-10-CM | POA: Diagnosis not present

## 2021-06-09 DIAGNOSIS — T8249XA Other complication of vascular dialysis catheter, initial encounter: Secondary | ICD-10-CM | POA: Diagnosis not present

## 2021-06-09 DIAGNOSIS — Z992 Dependence on renal dialysis: Secondary | ICD-10-CM | POA: Diagnosis not present

## 2021-06-09 DIAGNOSIS — N186 End stage renal disease: Secondary | ICD-10-CM | POA: Diagnosis not present

## 2021-06-12 DIAGNOSIS — Z992 Dependence on renal dialysis: Secondary | ICD-10-CM | POA: Diagnosis not present

## 2021-06-12 DIAGNOSIS — E1022 Type 1 diabetes mellitus with diabetic chronic kidney disease: Secondary | ICD-10-CM | POA: Diagnosis not present

## 2021-06-12 DIAGNOSIS — H25812 Combined forms of age-related cataract, left eye: Secondary | ICD-10-CM | POA: Diagnosis not present

## 2021-06-12 DIAGNOSIS — N186 End stage renal disease: Secondary | ICD-10-CM | POA: Diagnosis not present

## 2021-06-13 ENCOUNTER — Encounter: Payer: Self-pay | Admitting: Internal Medicine

## 2021-06-13 DIAGNOSIS — T8249XA Other complication of vascular dialysis catheter, initial encounter: Secondary | ICD-10-CM | POA: Diagnosis not present

## 2021-06-13 DIAGNOSIS — N186 End stage renal disease: Secondary | ICD-10-CM | POA: Diagnosis not present

## 2021-06-13 DIAGNOSIS — N2581 Secondary hyperparathyroidism of renal origin: Secondary | ICD-10-CM | POA: Diagnosis not present

## 2021-06-13 DIAGNOSIS — E66811 Obesity, class 1: Secondary | ICD-10-CM | POA: Insufficient documentation

## 2021-06-13 DIAGNOSIS — Z992 Dependence on renal dialysis: Secondary | ICD-10-CM | POA: Diagnosis not present

## 2021-06-13 DIAGNOSIS — E669 Obesity, unspecified: Secondary | ICD-10-CM | POA: Insufficient documentation

## 2021-06-13 NOTE — Assessment & Plan Note (Signed)
Recent essentially resolved except persistent mild dry cough, declines cxr  to f/u any worsening symptoms or concerns

## 2021-06-13 NOTE — Assessment & Plan Note (Signed)
Now on HD after initial try at home dialysis, cont f/u renal as planned

## 2021-06-13 NOTE — Assessment & Plan Note (Signed)
With inabiity for loss with diet, exercise, now for trial ozempic asd,  to f/u any worsening symptoms or concerns

## 2021-06-13 NOTE — Assessment & Plan Note (Signed)
Chronic uncontrolled,  Lab Results  Component Value Date   HGBA1C 7.3 (H) 12/22/2020   HGBA1C 9.3 (H) 06/25/2019   HGBA1C 7.3 10/07/2017   Lab Results  Component Value Date   CREATININE 18.42 (H) 02/14/2021   For add ozempic,  to f/u any worsening symptoms or concerns

## 2021-06-13 NOTE — Assessment & Plan Note (Signed)
BP Readings from Last 3 Encounters:  06/06/21 (!) 148/76  04/20/21 130/80  04/19/21 130/76  mild uncontrolled, but better controlled < 14090 per pt at home, pt to continue medical treatment losartan, amlodipine, toprol

## 2021-06-14 DIAGNOSIS — T8249XA Other complication of vascular dialysis catheter, initial encounter: Secondary | ICD-10-CM | POA: Diagnosis not present

## 2021-06-14 DIAGNOSIS — Z992 Dependence on renal dialysis: Secondary | ICD-10-CM | POA: Diagnosis not present

## 2021-06-14 DIAGNOSIS — N2581 Secondary hyperparathyroidism of renal origin: Secondary | ICD-10-CM | POA: Diagnosis not present

## 2021-06-14 DIAGNOSIS — N186 End stage renal disease: Secondary | ICD-10-CM | POA: Diagnosis not present

## 2021-06-16 DIAGNOSIS — T8249XA Other complication of vascular dialysis catheter, initial encounter: Secondary | ICD-10-CM | POA: Diagnosis not present

## 2021-06-16 DIAGNOSIS — N186 End stage renal disease: Secondary | ICD-10-CM | POA: Diagnosis not present

## 2021-06-16 DIAGNOSIS — Z992 Dependence on renal dialysis: Secondary | ICD-10-CM | POA: Diagnosis not present

## 2021-06-16 DIAGNOSIS — N2581 Secondary hyperparathyroidism of renal origin: Secondary | ICD-10-CM | POA: Diagnosis not present

## 2021-06-19 DIAGNOSIS — N2581 Secondary hyperparathyroidism of renal origin: Secondary | ICD-10-CM | POA: Diagnosis not present

## 2021-06-19 DIAGNOSIS — Z992 Dependence on renal dialysis: Secondary | ICD-10-CM | POA: Diagnosis not present

## 2021-06-19 DIAGNOSIS — T8249XA Other complication of vascular dialysis catheter, initial encounter: Secondary | ICD-10-CM | POA: Diagnosis not present

## 2021-06-19 DIAGNOSIS — N186 End stage renal disease: Secondary | ICD-10-CM | POA: Diagnosis not present

## 2021-06-21 DIAGNOSIS — T8249XA Other complication of vascular dialysis catheter, initial encounter: Secondary | ICD-10-CM | POA: Diagnosis not present

## 2021-06-21 DIAGNOSIS — N186 End stage renal disease: Secondary | ICD-10-CM | POA: Diagnosis not present

## 2021-06-21 DIAGNOSIS — Z992 Dependence on renal dialysis: Secondary | ICD-10-CM | POA: Diagnosis not present

## 2021-06-21 DIAGNOSIS — N2581 Secondary hyperparathyroidism of renal origin: Secondary | ICD-10-CM | POA: Diagnosis not present

## 2021-06-22 ENCOUNTER — Ambulatory Visit (INDEPENDENT_AMBULATORY_CARE_PROVIDER_SITE_OTHER): Payer: Federal, State, Local not specified - PPO | Admitting: Internal Medicine

## 2021-06-22 ENCOUNTER — Other Ambulatory Visit: Payer: Self-pay

## 2021-06-22 ENCOUNTER — Encounter: Payer: Self-pay | Admitting: Internal Medicine

## 2021-06-22 VITALS — BP 118/60 | HR 97 | Ht 66.0 in | Wt 211.0 lb

## 2021-06-22 DIAGNOSIS — N185 Chronic kidney disease, stage 5: Secondary | ICD-10-CM

## 2021-06-22 DIAGNOSIS — N2581 Secondary hyperparathyroidism of renal origin: Secondary | ICD-10-CM | POA: Diagnosis not present

## 2021-06-22 DIAGNOSIS — Z992 Dependence on renal dialysis: Secondary | ICD-10-CM | POA: Diagnosis not present

## 2021-06-22 DIAGNOSIS — E785 Hyperlipidemia, unspecified: Secondary | ICD-10-CM

## 2021-06-22 DIAGNOSIS — N186 End stage renal disease: Secondary | ICD-10-CM

## 2021-06-22 DIAGNOSIS — I10 Essential (primary) hypertension: Secondary | ICD-10-CM

## 2021-06-22 DIAGNOSIS — E1022 Type 1 diabetes mellitus with diabetic chronic kidney disease: Secondary | ICD-10-CM

## 2021-06-22 DIAGNOSIS — T8249XA Other complication of vascular dialysis catheter, initial encounter: Secondary | ICD-10-CM | POA: Diagnosis not present

## 2021-06-22 NOTE — Patient Instructions (Signed)
Please continue all other medications as before, and refills have been done if requested.  Please have the pharmacy call with any other refills you may need.  Please continue your efforts at being more active, low cholesterol diet, and weight control  Please keep your appointments with your specialists as you may have planned  You will be contacted regarding the referral for: Endocrinology

## 2021-06-22 NOTE — Progress Notes (Signed)
Patient ID: Hector Mahoney, male   DOB: [birth year], 50 y.o.   MRN: [id number]        Chief Complaint: follow up HTN, HLD and DM, ESRD       HPI:  Hector Mahoney is a 50 y.o. male here to f/u with reqeust to see local endocrinologist in addition to his endo at the [place]; he has struggled with poor control for many years now on dialysis/ESRD, and has come to realzation that he really needs to focus on better control with local Endo as other endocrinology at the [place] assistance has been suboptimal; Pt denies chest pain, increased sob or doe, wheezing, orthopnea, PND, increased LE swelling, palpitations, dizziness or syncope.   Pt denies polydipsia, polyuria, or new focal neuro s/s.   Pt denies fever, wt loss, night sweats, loss of appetite, or other constitutional symptoms        Wt Readings from Last 3 Encounters:  06/22/21 211 lb (95.7 kg)  06/06/21 212 lb (96.2 kg)  04/20/21 208 lb (94.3 kg)   BP Readings from Last 3 Encounters:  06/22/21 118/60  06/06/21 (!) 148/76  04/20/21 130/80         Past Medical History:  Diagnosis Date   Anemia    CHF (congestive heart failure) ([place])    DIABETES MELLITUS, TYPE I 07/30/2007   Esophageal reflux 04/27/2009   ESRD (end stage renal disease) on dialysis [facility])    home HD 4x/week   HYPERLIPIDEMIA 04/24/2007   HYPERTENSION 04/24/2007   Past Surgical History:  Procedure Laterality Date   AV FISTULA PLACEMENT Left 06/04/2019   Procedure: BRACHIAL-CEPHALIC ARTERIOVENOUS (AV) FISTULA CREATION LEFT ARM;  Surgeon: Rosetta Posner, MD;  Location: [facility] OR;  Service: Vascular;  Laterality: Left;   COLONOSCOPY     polyp   IR FLUORO GUIDE CV LINE RIGHT  10/22/2017   IR THROMBECTOMY AV FISTULA W/THROMBOLYSIS/PTA/STENT INC/SHUNT/IMG LT Left 03/2021   IR US GUIDE VASC ACCESS RIGHT  10/22/2017   MOUTH SURGERY     tooth ext    reports that he quit smoking about 6 years ago. His smoking use included cigarettes. He has never used smokeless tobacco. He reports  current alcohol use. He reports that he does not use drugs. family history includes Colon cancer in his maternal uncle; Colon polyps in his maternal uncle; Diabetes in his maternal aunt and mother; Heart disease in his maternal uncle; Hypertension in his mother; Lung cancer in his maternal grandfather. No Known Allergies Current Outpatient Medications on File Prior to Visit  Medication Sig Dispense Refill   albuterol (VENTOLIN HFA) 108 (90 Base) MCG/ACT inhaler Inhale 2 puffs into the lungs every 6 (six) hours as needed for wheezing or shortness of breath. 8 g 0   aspirin 81 MG EC tablet Take by mouth.     atorvastatin (LIPITOR) 80 MG tablet Take 80 mg by mouth daily.     AURYXIA 1 GM 210 MG(Fe) tablet Take 420 mg by mouth 3 (three) times daily.     calcitRIOL (ROCALTROL) 0.25 MCG capsule Take 1 capsule (0.25 mcg total) by mouth every Monday, Wednesday, and Friday at 6 PM. 15 capsule 0   cetirizine (ZYRTEC) 10 MG tablet Take 1 tablet by mouth once daily 30 tablet 0   cinacalcet (SENSIPAR) 30 MG tablet Take 60 mg by mouth every evening.     Continuous Blood Gluc Sensor (FREESTYLE LIBRE 14 DAY SENSOR) MISC 1 each by Other route daily.  dicyclomine (BENTYL) 20 MG tablet Take 1 tablet (20 mg total) by mouth in the morning and at bedtime. 180 tablet 3   gabapentin (NEURONTIN) 300 MG capsule TAKE 1 TO 2 CAPSULES BY MOUTH AT BEDTIME AS NEEDED 180 capsule 1   insulin aspart (NOVOLOG) 100 UNIT/ML injection INJECT 8 TO 12 UNITS INTO THE SKIN THREE TIMES DAILY WITH MEALS (Patient taking differently: Inject 8-12 Units into the skin 3 (three) times daily with meals.) 10 mL 0   insulin glargine (LANTUS) 100 UNIT/ML injection Inject 0.25 mLs (25 Units total) into the skin at bedtime. 20 mL 5   levETIRAcetam (KEPPRA) 250 MG tablet Take 1 tablet (250 mg total) by mouth at bedtime. 90 tablet 3   losartan (COZAAR) 50 MG tablet Take 1 tablet by mouth daily.     meclizine (ANTIVERT) 12.5 MG tablet TAKE 1 TABLET BY  MOUTH THREE TIMES DAILY AS NEEDED FOR DIZZINESS (Patient taking differently: Take 12.5 mg by mouth 3 (three) times daily as needed for dizziness.) 40 tablet 0   metoprolol succinate (TOPROL-XL) 25 MG 24 hr tablet Take 25 mg by mouth daily.     multivitamin (RENA-VIT) TABS tablet Take 1 tablet by mouth daily.     NORVASC 10 MG tablet Take 10 mg by mouth at bedtime.     Nutritional Supplements (VITAMIN D BOOSTER PO) Take 1 mg by mouth 3 (three) times a week.     omeprazole (PRILOSEC) 40 MG capsule Take 1 capsule (40 mg total) by mouth 2 (two) times daily. 180 capsule 3   ondansetron (ZOFRAN) 4 MG tablet Take 1 tablet (4 mg total) by mouth every 8 (eight) hours as needed for nausea or vomiting. 30 tablet 0   polyethylene glycol powder (GLYCOLAX/MIRALAX) 17 GM/SCOOP powder Take 1 Container by mouth daily as needed for mild constipation.     rOPINIRole (REQUIP) 1 MG tablet Take 1 mg by mouth at bedtime.     rosuvastatin (CRESTOR) 10 MG tablet Take 1 tablet (10 mg total) by mouth daily. 30 tablet 3   Semaglutide,0.25 or 0.5MG /DOS, (OZEMPIC, 0.25 OR 0.5 MG/DOSE,) 2 MG/1.5ML SOPN Inject 0.5 mg into the skin once a week. 4.5 mL 3   triamcinolone (NASACORT) 55 MCG/ACT AERO nasal inhaler Place 2 sprays into the nose daily. (Patient taking differently: Place 2 sprays into the nose daily as needed (allergies).) 1 each 0   Tuberculin PPD (TUBERSOL ID) Inject 1 mg into the skin.     fexofenadine (ALLEGRA) 180 MG tablet Take 1 tablet (180 mg total) by mouth daily. 90 tablet 3   No current facility-administered medications on file prior to visit.        ROS:  All others reviewed and negative.  Objective        PE:  BP 118/60 (BP Location: Right Arm, Patient Position: Sitting, Cuff Size: Large)   Pulse 97   Ht 5\' 6"  (1.676 m)   Wt 211 lb (95.7 kg)   SpO2 98%   BMI 34.06 kg/m                 Constitutional: Pt appears in NAD               HENT: Head: NCAT.                Right Ear: External ear normal.                  Left Ear: External ear normal.  Eyes: . Pupils are equal, round, and reactive to light. Conjunctivae and EOM are normal               Nose: without d/c or deformity               Neck: Neck supple. Gross normal ROM               Cardiovascular: Normal rate and regular rhythm.                 Pulmonary/Chest: Effort normal and breath sounds without rales or wheezing.                Abd:  Soft, NT, ND, + BS, no organomegaly               Neurological: Pt is alert. At baseline orientation, motor grossly intact               Skin: Skin is warm. No rashes, no other new lesions, LE edema - none               Psychiatric: Pt behavior is normal without agitation   Micro: none  Cardiac tracings I have personally interpreted today:  none  Pertinent Radiological findings (summarize): none   Lab Results  Component Value Date   WBC 11.3 (H) 02/14/2021   HGB 11.8 (L) 02/14/2021   HCT 37.4 (L) 02/14/2021   PLT 267 02/14/2021   GLUCOSE 130 (H) 02/14/2021   CHOL 183 06/25/2019   TRIG 214.0 (H) 06/25/2019   HDL 58.40 06/25/2019   LDLDIRECT 80.0 06/25/2019   ALT 23 12/25/2020   AST 15 12/25/2020   NA 133 (L) 02/14/2021   K 6.7 (HH) 02/14/2021   CL 93 (L) 02/14/2021   CREATININE 18.42 (H) 02/14/2021   BUN 122 (H) 02/14/2021   CO2 22 02/14/2021   TSH 2.03 06/25/2019   PSA 1.39 06/25/2019   INR 1.05 10/21/2017   HGBA1C 7.3 (H) 12/22/2020   Assessment/Plan:  Massey L Pember is a 50 y.o. Black or African American [2] male with  has a past medical history of Anemia, CHF (congestive heart failure) ([place]), DIABETES MELLITUS, TYPE I (07/30/2007), Esophageal reflux (04/27/2009), ESRD (end stage renal disease) on dialysis ([place]), HYPERLIPIDEMIA (04/24/2007), and HYPERTENSION (04/24/2007).  Essential hypertension BP Readings from Last 3 Encounters:  06/22/21 118/60  06/06/21 (!) 148/76  04/20/21 130/80   Stable, pt to continue medical treatment losartan ,  toprol   Type 1 diabetes mellitus ([place]) Lab Results  Component Value Date   HGBA1C 7.3 (H) 12/22/2020   Uncontrolled though improved over baseline about 8 for many years, pt to continue current medical treatment novolog, lantus and refer to local endo as well   Dyslipidemia Lab Results  Component Value Date   CHOL 183 06/25/2019   HDL 58.40 06/25/2019   LDLDIRECT 80.0 06/25/2019   TRIG 214.0 (H) 06/25/2019   CHOLHDL 3 06/25/2019    Mild uncontrolled, goal ldl < 70., pt to continue current statin lipitor 80, declines add zetia or lipid clinic for now, for contd DM low chol diet as well.   ESRD on hemodialysis ([place]) Pt to continue HD as planned, stable volume today  Followup: Return if symptoms worsen or fail to improve.  Cathlean Cower, MD 06/25/2021 7:19 AM [facility]

## 2021-06-23 DIAGNOSIS — Z992 Dependence on renal dialysis: Secondary | ICD-10-CM | POA: Diagnosis not present

## 2021-06-23 DIAGNOSIS — T8249XA Other complication of vascular dialysis catheter, initial encounter: Secondary | ICD-10-CM | POA: Diagnosis not present

## 2021-06-23 DIAGNOSIS — N2581 Secondary hyperparathyroidism of renal origin: Secondary | ICD-10-CM | POA: Diagnosis not present

## 2021-06-23 DIAGNOSIS — N186 End stage renal disease: Secondary | ICD-10-CM | POA: Diagnosis not present

## 2021-06-25 ENCOUNTER — Encounter: Payer: Self-pay | Admitting: Internal Medicine

## 2021-06-25 NOTE — Assessment & Plan Note (Signed)
Pt to continue HD as planned, stable volume today

## 2021-06-25 NOTE — Assessment & Plan Note (Signed)
BP Readings from Last 3 Encounters:  06/22/21 118/60  06/06/21 (!) 148/76  04/20/21 130/80   Stable, pt to continue medical treatment losartan , toprol

## 2021-06-25 NOTE — Assessment & Plan Note (Signed)
Lab Results  Component Value Date   CHOL 183 06/25/2019   HDL 58.40 06/25/2019   LDLDIRECT 80.0 06/25/2019   TRIG 214.0 (H) 06/25/2019   CHOLHDL 3 06/25/2019    Mild uncontrolled, goal ldl < 70., pt to continue current statin lipitor 80, declines add zetia or lipid clinic for now, for contd DM low chol diet as well.

## 2021-06-25 NOTE — Assessment & Plan Note (Signed)
Lab Results  Component Value Date   HGBA1C 7.3 (H) 12/22/2020   Uncontrolled though improved over baseline about 8 for many years, pt to continue current medical treatment novolog, lantus and refer to local endo as well

## 2021-06-26 DIAGNOSIS — N2581 Secondary hyperparathyroidism of renal origin: Secondary | ICD-10-CM | POA: Diagnosis not present

## 2021-06-26 DIAGNOSIS — T8249XA Other complication of vascular dialysis catheter, initial encounter: Secondary | ICD-10-CM | POA: Diagnosis not present

## 2021-06-26 DIAGNOSIS — Z992 Dependence on renal dialysis: Secondary | ICD-10-CM | POA: Diagnosis not present

## 2021-06-26 DIAGNOSIS — N186 End stage renal disease: Secondary | ICD-10-CM | POA: Diagnosis not present

## 2021-06-26 NOTE — Progress Notes (Deleted)
[facility] OF [place]  ASSESSMENT / PLAN: 50 y.o. male with ESRD who desires transition to peritoneal dialysis. *** - ***  CHIEF COMPLAINT: ***  HISTORY OF PRESENT ILLNESS: Hector Mahoney is a 50 y.o. male ***  VASCULAR SURGICAL HISTORY: ***  VASCULAR RISK FACTORS: {FINDINGS; POSITIVE [phone]} history of stroke / transient ischemic attack. {FINDINGS; POSITIVE [phone]} history of coronary artery disease. *** history of PCI. *** history of CABG.  {FINDINGS; POSITIVE [phone]} history of diabetes mellitus. Last A1c ***. {FINDINGS; POSITIVE [phone]} history of smoking. *** actively smoking. {FINDINGS; POSITIVE [phone]} history of hypertension. *** drug regimen with *** control. {FINDINGS; POSITIVE [phone]} history of chronic kidney disease.  Last GFR ***. CKD {stage:30421363}. {FINDINGS; POSITIVE [phone]} history of chronic obstructive pulmonary disease, treated with ***.  FUNCTIONAL STATUS: ECOG performance status: {findings; ecog performance status:31780} Ambulatory status: {TNHAmbulation:25868}  Past Medical History:  Diagnosis Date   Anemia    CHF (congestive heart failure) ([place])    DIABETES MELLITUS, TYPE I 07/30/2007   Esophageal reflux 04/27/2009   ESRD (end stage renal disease) on dialysis [facility])    home HD 4x/week   HYPERLIPIDEMIA 04/24/2007   HYPERTENSION 04/24/2007    Past Surgical History:  Procedure Laterality Date   AV FISTULA PLACEMENT Left 06/04/2019   Procedure: BRACHIAL-CEPHALIC ARTERIOVENOUS (AV) FISTULA CREATION LEFT ARM;  Surgeon: Rosetta Posner, MD;  Location: [facility] OR;  Service: Vascular;  Laterality: Left;   COLONOSCOPY     polyp   IR FLUORO GUIDE CV LINE RIGHT  10/22/2017   IR THROMBECTOMY AV FISTULA W/THROMBOLYSIS/PTA/STENT INC/SHUNT/IMG LT Left 03/2021   IR US GUIDE VASC ACCESS RIGHT  10/22/2017   MOUTH SURGERY     tooth ext    Family History   Problem Relation Age of Onset   Hypertension Mother    Diabetes Mother    Diabetes Maternal Aunt    Lung cancer Maternal Grandfather    Colon polyps Maternal Uncle    Colon cancer Maternal Uncle    Heart disease Maternal Uncle    Kidney disease Neg Hx    Esophageal cancer Neg Hx    Rectal cancer Neg Hx    Stomach cancer Neg Hx     Social History   Socioeconomic History   Marital status: Married    Spouse name: Not on file   Number of children: 0   Years of education: Not on file   Highest education level: Not on file  Occupational History   Not on file  Tobacco Use   Smoking status: Former    Types: Cigarettes    Quit date: 05/03/2015    Years since quitting: 6.1   Smokeless tobacco: Never   Tobacco comments:    12/22/2012 "used to smoke cigarettes once or twice/month; haven't had any cigarettes for years"  Vaping Use   Vaping Use: Never used  Substance and Sexual Activity   Alcohol use: Yes    Comment: occasional    Drug use: No   Sexual activity: Yes  Other Topics Concern   Not on file  Social History Narrative   Right Handed   Lives in a one story home   Drinks little caffeine    Social Determinants of Health   Financial Resource Strain: Not on file  Food Insecurity: Not on file  Transportation Needs: Not on file  Physical Activity: Not on file  Stress: Not on file  Social Connections: Not on file  Intimate Partner Violence: Not on file  No Known Allergies  Current Outpatient Medications  Medication Sig Dispense Refill   albuterol (VENTOLIN HFA) 108 (90 Base) MCG/ACT inhaler Inhale 2 puffs into the lungs every 6 (six) hours as needed for wheezing or shortness of breath. 8 g 0   aspirin 81 MG EC tablet Take by mouth.     atorvastatin (LIPITOR) 80 MG tablet Take 80 mg by mouth daily.     AURYXIA 1 GM 210 MG(Fe) tablet Take 420 mg by mouth 3 (three) times daily.     calcitRIOL (ROCALTROL) 0.25 MCG capsule Take 1 capsule (0.25 mcg total) by mouth  every Monday, Wednesday, and Friday at 6 PM. 15 capsule 0   cetirizine (ZYRTEC) 10 MG tablet Take 1 tablet by mouth once daily 30 tablet 0   cinacalcet (SENSIPAR) 30 MG tablet Take 60 mg by mouth every evening.     Continuous Blood Gluc Sensor (FREESTYLE LIBRE 14 DAY SENSOR) MISC 1 each by Other route daily.     dicyclomine (BENTYL) 20 MG tablet Take 1 tablet (20 mg total) by mouth in the morning and at bedtime. 180 tablet 3   fexofenadine (ALLEGRA) 180 MG tablet Take 1 tablet (180 mg total) by mouth daily. 90 tablet 3   gabapentin (NEURONTIN) 300 MG capsule TAKE 1 TO 2 CAPSULES BY MOUTH AT BEDTIME AS NEEDED 180 capsule 1   insulin aspart (NOVOLOG) 100 UNIT/ML injection INJECT 8 TO 12 UNITS INTO THE SKIN THREE TIMES DAILY WITH MEALS (Patient taking differently: Inject 8-12 Units into the skin 3 (three) times daily with meals.) 10 mL 0   insulin glargine (LANTUS) 100 UNIT/ML injection Inject 0.25 mLs (25 Units total) into the skin at bedtime. 20 mL 5   levETIRAcetam (KEPPRA) 250 MG tablet Take 1 tablet (250 mg total) by mouth at bedtime. 90 tablet 3   losartan (COZAAR) 50 MG tablet Take 1 tablet by mouth daily.     meclizine (ANTIVERT) 12.5 MG tablet TAKE 1 TABLET BY MOUTH THREE TIMES DAILY AS NEEDED FOR DIZZINESS (Patient taking differently: Take 12.5 mg by mouth 3 (three) times daily as needed for dizziness.) 40 tablet 0   metoprolol succinate (TOPROL-XL) 25 MG 24 hr tablet Take 25 mg by mouth daily.     multivitamin (RENA-VIT) TABS tablet Take 1 tablet by mouth daily.     NORVASC 10 MG tablet Take 10 mg by mouth at bedtime.     Nutritional Supplements (VITAMIN D BOOSTER PO) Take 1 mg by mouth 3 (three) times a week.     omeprazole (PRILOSEC) 40 MG capsule Take 1 capsule (40 mg total) by mouth 2 (two) times daily. 180 capsule 3   ondansetron (ZOFRAN) 4 MG tablet Take 1 tablet (4 mg total) by mouth every 8 (eight) hours as needed for nausea or vomiting. 30 tablet 0   polyethylene glycol powder  (GLYCOLAX/MIRALAX) 17 GM/SCOOP powder Take 1 Container by mouth daily as needed for mild constipation.     rOPINIRole (REQUIP) 1 MG tablet Take 1 mg by mouth at bedtime.     rosuvastatin (CRESTOR) 10 MG tablet Take 1 tablet (10 mg total) by mouth daily. 30 tablet 3   Semaglutide,0.25 or 0.5MG /DOS, (OZEMPIC, 0.25 OR 0.5 MG/DOSE,) 2 MG/1.5ML SOPN Inject 0.5 mg into the skin once a week. 4.5 mL 3   triamcinolone (NASACORT) 55 MCG/ACT AERO nasal inhaler Place 2 sprays into the nose daily. (Patient taking differently: Place 2 sprays into the nose daily as needed (allergies).) 1 each 0  Tuberculin PPD (TUBERSOL ID) Inject 1 mg into the skin.     No current facility-administered medications for this visit.    REVIEW OF SYSTEMS:  [X]  denotes positive finding, [ ]  denotes negative finding Cardiac  Comments:  Chest pain or chest pressure: ***   Shortness of breath upon exertion:    Short of breath when lying flat:    Irregular heart rhythm:        Vascular    Pain in calf, thigh, or hip brought on by ambulation:    Pain in feet at night that wakes you up from your sleep:     Blood clot in your veins:    Leg swelling:         Pulmonary    Oxygen at home:    Productive cough:     Wheezing:         Neurologic    Sudden weakness in arms or legs:     Sudden numbness in arms or legs:     Sudden onset of difficulty speaking or slurred speech:    Temporary loss of vision in one eye:     Problems with dizziness:         Gastrointestinal    Blood in stool:     Vomited blood:         Genitourinary    Burning when urinating:     Blood in urine:        Psychiatric    Major depression:         Hematologic    Bleeding problems:    Problems with blood clotting too easily:        Skin    Rashes or ulcers:        Constitutional    Fever or chills:      PHYSICAL EXAM There were no vitals filed for this visit.  Constitutional: *** appearing. *** distress. Appears *** nourished.   Neurologic: CN ***. *** focal findings. *** sensory loss. Psychiatric: *** Mood and affect symmetric and appropriate. Eyes: *** No icterus. No conjunctival pallor. Ears, nose, throat: *** mucous membranes moist. Midline trachea.  Cardiac: *** rate and rhythm.  Respiratory: *** unlabored. Abdominal: *** soft, non-tender, non-distended.  Peripheral vascular: *** Extremity: *** edema. *** cyanosis. *** pallor.  Skin: *** gangrene. *** ulceration.  Lymphatic: *** Stemmer's sign. *** palpable lymphadenopathy.  PERTINENT LABORATORY AND RADIOLOGIC DATA  Most recent CBC CBC Latest Ref Rng & Units 02/14/2021 12/25/2020 12/24/2020  WBC 4.0 - 10.5 K/uL 11.3(H) 16.2(H) 15.2(H)  Hemoglobin 13.0 - 17.0 g/dL 11.8(L) 10.1(L) 9.7(L)  Hematocrit 39.0 - 52.0 % 37.4(L) 30.6(L) 30.0(L)  Platelets 150 - 400 K/uL 267 380 341     Most recent CMP CMP Latest Ref Rng & Units 02/14/2021 12/25/2020 12/24/2020  Glucose 70 - 99 mg/dL 130(H) 341(H) 307(H)  BUN 6 - 20 mg/dL 122(H) 77(H) 42(H)  Creatinine 0.61 - 1.24 mg/dL 18.42(H) 11.72(H) 8.63(H)  Sodium 135 - 145 mmol/L 133(L) 133(L) 134(L)  Potassium 3.5 - 5.1 mmol/L 6.7(HH) 5.0 4.5  Chloride 98 - 111 mmol/L 93(L) 89(L) 93(L)  CO2 22 - 32 mmol/L 22 24 25   Calcium 8.9 - 10.3 mg/dL 9.2 9.1 8.9  Total Protein 6.5 - 8.1 g/dL - 7.4 7.9  Total Bilirubin 0.3 - 1.2 mg/dL - 0.5 0.7  Alkaline Phos 38 - 126 U/L - 80 69  AST 15 - 41 U/L - 15 16  ALT 0 - 44 U/L - 23 21  Renal function CrCl cannot be calculated (Patient's most recent lab result is older than the maximum 21 days allowed.).  Hgb A1c MFr Bld (%)  Date Value  12/22/2020 7.3 (H)    Direct LDL  Date Value Ref Range Status  06/25/2019 80.0 mg/dL Final    Comment:    Optimal:  <100 mg/dLNear or Above Optimal:  100-129 mg/dLBorderline High:  130-159 mg/dLHigh:  160-189 mg/dLVery High:  >190 mg/dL     Vascular Imaging: ***  Zaina Jenkin N. Stanford Breed, MD [facility]  Phone Number: [phone] 06/26/2021 5:25 PM  Total time spent on preparing this encounter including chart review, data review, collecting history, examining the patient, coordinating care for this {tnhtimebilling:26202}  Portions of this report may have been transcribed using voice recognition software.  Every effort has been made to ensure accuracy; however, inadvertent computerized transcription errors may still be present.

## 2021-06-27 ENCOUNTER — Ambulatory Visit: Payer: Federal, State, Local not specified - PPO | Admitting: Vascular Surgery

## 2021-06-27 ENCOUNTER — Other Ambulatory Visit: Payer: Self-pay | Admitting: Internal Medicine

## 2021-06-28 DIAGNOSIS — Z992 Dependence on renal dialysis: Secondary | ICD-10-CM | POA: Diagnosis not present

## 2021-06-28 DIAGNOSIS — M545 Low back pain, unspecified: Secondary | ICD-10-CM | POA: Diagnosis not present

## 2021-06-28 DIAGNOSIS — N186 End stage renal disease: Secondary | ICD-10-CM | POA: Diagnosis not present

## 2021-06-28 DIAGNOSIS — T8249XA Other complication of vascular dialysis catheter, initial encounter: Secondary | ICD-10-CM | POA: Diagnosis not present

## 2021-06-28 DIAGNOSIS — S39012A Strain of muscle, fascia and tendon of lower back, initial encounter: Secondary | ICD-10-CM | POA: Diagnosis not present

## 2021-06-28 DIAGNOSIS — N2581 Secondary hyperparathyroidism of renal origin: Secondary | ICD-10-CM | POA: Diagnosis not present

## 2021-06-28 DIAGNOSIS — X500XXA Overexertion from strenuous movement or load, initial encounter: Secondary | ICD-10-CM | POA: Diagnosis not present

## 2021-06-30 DIAGNOSIS — N186 End stage renal disease: Secondary | ICD-10-CM | POA: Diagnosis not present

## 2021-06-30 DIAGNOSIS — T8249XA Other complication of vascular dialysis catheter, initial encounter: Secondary | ICD-10-CM | POA: Diagnosis not present

## 2021-06-30 DIAGNOSIS — Z992 Dependence on renal dialysis: Secondary | ICD-10-CM | POA: Diagnosis not present

## 2021-06-30 DIAGNOSIS — N2581 Secondary hyperparathyroidism of renal origin: Secondary | ICD-10-CM | POA: Diagnosis not present

## 2021-07-03 DIAGNOSIS — N186 End stage renal disease: Secondary | ICD-10-CM | POA: Diagnosis not present

## 2021-07-03 DIAGNOSIS — T8249XA Other complication of vascular dialysis catheter, initial encounter: Secondary | ICD-10-CM | POA: Diagnosis not present

## 2021-07-03 DIAGNOSIS — Z992 Dependence on renal dialysis: Secondary | ICD-10-CM | POA: Diagnosis not present

## 2021-07-03 DIAGNOSIS — N2581 Secondary hyperparathyroidism of renal origin: Secondary | ICD-10-CM | POA: Diagnosis not present

## 2021-07-05 DIAGNOSIS — N2581 Secondary hyperparathyroidism of renal origin: Secondary | ICD-10-CM | POA: Diagnosis not present

## 2021-07-05 DIAGNOSIS — T8249XA Other complication of vascular dialysis catheter, initial encounter: Secondary | ICD-10-CM | POA: Diagnosis not present

## 2021-07-05 DIAGNOSIS — Z992 Dependence on renal dialysis: Secondary | ICD-10-CM | POA: Diagnosis not present

## 2021-07-05 DIAGNOSIS — N186 End stage renal disease: Secondary | ICD-10-CM | POA: Diagnosis not present

## 2021-07-08 DIAGNOSIS — N186 End stage renal disease: Secondary | ICD-10-CM | POA: Diagnosis not present

## 2021-07-08 DIAGNOSIS — Z992 Dependence on renal dialysis: Secondary | ICD-10-CM | POA: Diagnosis not present

## 2021-07-08 DIAGNOSIS — T8249XA Other complication of vascular dialysis catheter, initial encounter: Secondary | ICD-10-CM | POA: Diagnosis not present

## 2021-07-08 DIAGNOSIS — N2581 Secondary hyperparathyroidism of renal origin: Secondary | ICD-10-CM | POA: Diagnosis not present

## 2021-07-10 DIAGNOSIS — N186 End stage renal disease: Secondary | ICD-10-CM | POA: Diagnosis not present

## 2021-07-10 DIAGNOSIS — T8249XA Other complication of vascular dialysis catheter, initial encounter: Secondary | ICD-10-CM | POA: Diagnosis not present

## 2021-07-10 DIAGNOSIS — N2581 Secondary hyperparathyroidism of renal origin: Secondary | ICD-10-CM | POA: Diagnosis not present

## 2021-07-10 DIAGNOSIS — Z992 Dependence on renal dialysis: Secondary | ICD-10-CM | POA: Diagnosis not present

## 2021-07-10 NOTE — Progress Notes (Signed)
VASCULAR AND VEIN SPECIALISTS OF [place]  ASSESSMENT / PLAN: 50 y.o. male with ESRD who desires transition to peritoneal dialysis.  He has had a peritoneal dialysis catheter previously.  I counseled him that we may not be able to put in a new peritoneal dialysis catheter depending on the amount of scar in his belly.  We will start the case with a diagnostic laparoscopy.  If I feel it safe to proceed, we will proceed with omentopexy and peritoneal dialysis catheter placement.  We will arrange for a nondialysis day in the near future.  CHIEF COMPLAINT: End-stage renal disease, desires transition to peritoneal dialysis  HISTORY OF PRESENT ILLNESS: Hector Mahoney is a 50 y.o. male with end-stage renal disease.  He is dialyzing through a right internal jugular tunneled dialysis catheter on Monday, Wednesday, and Friday.  He has previously had a peritoneal dialysis catheter which worked well for him.  It became infected and had to be removed.  He has had no other abdominal surgeries before.  He is right-handed.  He was previously dialyzing through a left arm brachiocephalic arteriovenous fistula, but this is thrombosed.  Past Medical History:  Diagnosis Date   Anemia    CHF (congestive heart failure) ([place])    DIABETES MELLITUS, TYPE I 07/30/2007   Esophageal reflux 04/27/2009   ESRD (end stage renal disease) on dialysis [facility])    home HD 4x/week   HYPERLIPIDEMIA 04/24/2007   HYPERTENSION 04/24/2007    Past Surgical History:  Procedure Laterality Date   AV FISTULA PLACEMENT Left 06/04/2019   Procedure: BRACHIAL-CEPHALIC ARTERIOVENOUS (AV) FISTULA CREATION LEFT ARM;  Surgeon: Rosetta Posner, MD;  Location: [facility] OR;  Service: Vascular;  Laterality: Left;   COLONOSCOPY     polyp   IR FLUORO GUIDE CV LINE RIGHT  10/22/2017   IR THROMBECTOMY AV FISTULA W/THROMBOLYSIS/PTA/STENT INC/SHUNT/IMG LT Left 03/2021   IR US GUIDE VASC ACCESS RIGHT  10/22/2017   MOUTH SURGERY     tooth ext    Family  History  Problem Relation Age of Onset   Hypertension Mother    Diabetes Mother    Diabetes Maternal Aunt    Lung cancer Maternal Grandfather    Colon polyps Maternal Uncle    Colon cancer Maternal Uncle    Heart disease Maternal Uncle    Kidney disease Neg Hx    Esophageal cancer Neg Hx    Rectal cancer Neg Hx    Stomach cancer Neg Hx     Social History   Socioeconomic History   Marital status: Married    Spouse name: Not on file   Number of children: 0   Years of education: Not on file   Highest education level: Not on file  Occupational History   Not on file  Tobacco Use   Smoking status: Former    Types: Cigarettes    Quit date: 05/03/2015    Years since quitting: 6.1   Smokeless tobacco: Never   Tobacco comments:    12/22/2012 "used to smoke cigarettes once or twice/month; haven't had any cigarettes for years"  Vaping Use   Vaping Use: Never used  Substance and Sexual Activity   Alcohol use: Yes    Comment: occasional    Drug use: No   Sexual activity: Yes  Other Topics Concern   Not on file  Social History Narrative   Right Handed   Lives in a one story home   Drinks little caffeine    Social Determinants of  Health   Financial Resource Strain: Not on file  Food Insecurity: Not on file  Transportation Needs: Not on file  Physical Activity: Not on file  Stress: Not on file  Social Connections: Not on file  Intimate Partner Violence: Not on file    No Known Allergies  Current Outpatient Medications  Medication Sig Dispense Refill   albuterol (VENTOLIN HFA) 108 (90 Base) MCG/ACT inhaler Inhale 2 puffs into the lungs every 6 (six) hours as needed for wheezing or shortness of breath. 8 g 0   aspirin 81 MG EC tablet Take by mouth.     atorvastatin (LIPITOR) 80 MG tablet Take 80 mg by mouth daily.     AURYXIA 1 GM 210 MG(Fe) tablet Take 420 mg by mouth 3 (three) times daily.     calcitRIOL (ROCALTROL) 0.25 MCG capsule Take 1 capsule (0.25 mcg total) by  mouth every Monday, Wednesday, and Friday at 6 PM. 15 capsule 0   cetirizine (ZYRTEC) 10 MG tablet Take 1 tablet by mouth once daily 30 tablet 0   cinacalcet (SENSIPAR) 30 MG tablet Take 60 mg by mouth every evening.     Continuous Blood Gluc Sensor (FREESTYLE LIBRE 14 DAY SENSOR) MISC 1 each by Other route daily.     dicyclomine (BENTYL) 20 MG tablet Take 1 tablet (20 mg total) by mouth in the morning and at bedtime. 180 tablet 3   fexofenadine (ALLEGRA) 180 MG tablet Take 1 tablet (180 mg total) by mouth daily. 90 tablet 3   gabapentin (NEURONTIN) 300 MG capsule TAKE 1 TO 2 CAPSULES BY MOUTH AT BEDTIME AS NEEDED 180 capsule 0   insulin aspart (NOVOLOG) 100 UNIT/ML injection INJECT 8 TO 12 UNITS INTO THE SKIN THREE TIMES DAILY WITH MEALS (Patient taking differently: Inject 8-12 Units into the skin 3 (three) times daily with meals.) 10 mL 0   insulin glargine (LANTUS) 100 UNIT/ML injection Inject 0.25 mLs (25 Units total) into the skin at bedtime. 20 mL 5   levETIRAcetam (KEPPRA) 250 MG tablet Take 1 tablet (250 mg total) by mouth at bedtime. 90 tablet 3   losartan (COZAAR) 50 MG tablet Take 1 tablet by mouth daily.     meclizine (ANTIVERT) 12.5 MG tablet TAKE 1 TABLET BY MOUTH THREE TIMES DAILY AS NEEDED FOR DIZZINESS (Patient taking differently: Take 12.5 mg by mouth 3 (three) times daily as needed for dizziness.) 40 tablet 0   metoprolol succinate (TOPROL-XL) 25 MG 24 hr tablet Take 25 mg by mouth daily.     multivitamin (RENA-VIT) TABS tablet Take 1 tablet by mouth daily.     NORVASC 10 MG tablet Take 10 mg by mouth at bedtime.     Nutritional Supplements (VITAMIN D BOOSTER PO) Take 1 mg by mouth 3 (three) times a week.     omeprazole (PRILOSEC) 40 MG capsule Take 1 capsule (40 mg total) by mouth 2 (two) times daily. 180 capsule 3   ondansetron (ZOFRAN) 4 MG tablet Take 1 tablet (4 mg total) by mouth every 8 (eight) hours as needed for nausea or vomiting. 30 tablet 0   polyethylene glycol  powder (GLYCOLAX/MIRALAX) 17 GM/SCOOP powder Take 1 Container by mouth daily as needed for mild constipation.     rOPINIRole (REQUIP) 1 MG tablet Take 1 mg by mouth at bedtime.     rosuvastatin (CRESTOR) 10 MG tablet Take 1 tablet (10 mg total) by mouth daily. 30 tablet 3   Semaglutide,0.25 or 0.5MG /DOS, (OZEMPIC, 0.25 OR 0.5 MG/DOSE,)  2 MG/1.5ML SOPN Inject 0.5 mg into the skin once a week. 4.5 mL 3   triamcinolone (NASACORT) 55 MCG/ACT AERO nasal inhaler Place 2 sprays into the nose daily. (Patient taking differently: Place 2 sprays into the nose daily as needed (allergies).) 1 each 0   Tuberculin PPD (TUBERSOL ID) Inject 1 mg into the skin.     No current facility-administered medications for this visit.    REVIEW OF SYSTEMS:  [X]  denotes positive finding, [ ]  denotes negative finding Cardiac  Comments:  Chest pain or chest pressure:    Shortness of breath upon exertion:    Short of breath when lying flat:    Irregular heart rhythm:        Vascular    Pain in calf, thigh, or hip brought on by ambulation:    Pain in feet at night that wakes you up from your sleep:     Blood clot in your veins:    Leg swelling:         Pulmonary    Oxygen at home:    Productive cough:     Wheezing:         Neurologic    Sudden weakness in arms or legs:     Sudden numbness in arms or legs:     Sudden onset of difficulty speaking or slurred speech:    Temporary loss of vision in one eye:     Problems with dizziness:         Gastrointestinal    Blood in stool:     Vomited blood:         Genitourinary    Burning when urinating:     Blood in urine:        Psychiatric    Major depression:         Hematologic    Bleeding problems:    Problems with blood clotting too easily:        Skin    Rashes or ulcers:        Constitutional    Fever or chills:      PHYSICAL EXAM Vitals:   07/11/21 1626  BP: 121/88  Pulse: 90  Resp: 20  Temp: 98.4 F (36.9 C)  SpO2: 99%  Weight: 210 lb  (95.3 kg)  Height: 5\' 6"  (1.676 m)    Constitutional: well appearing. no distress. Appears well nourished.  Neurologic: CN intact. no focal findings. no sensory loss. Psychiatric:  Mood and affect symmetric and appropriate. Eyes:  No icterus. No conjunctival pallor. Ears, nose, throat:  mucous membranes moist. Midline trachea.  Cardiac: regular rate and rhythm.  Respiratory:  unlabored. Abdominal:  soft, non-tender, non-distended. Scars consistent with prior lap PD catheter placement Peripheral vascular: LUE BC AVF thrombosed. Extremity: no edema. no cyanosis. no pallor.  Skin: no gangrene. no ulceration.  Lymphatic: no Stemmer's sign. no palpable lymphadenopathy.  PERTINENT LABORATORY AND RADIOLOGIC DATA  Most recent CBC CBC Latest Ref Rng & Units 02/14/2021 12/25/2020 12/24/2020  WBC 4.0 - 10.5 K/uL 11.3(H) 16.2(H) 15.2(H)  Hemoglobin 13.0 - 17.0 g/dL 11.8(L) 10.1(L) 9.7(L)  Hematocrit 39.0 - 52.0 % 37.4(L) 30.6(L) 30.0(L)  Platelets 150 - 400 K/uL 267 380 341     Most recent CMP CMP Latest Ref Rng & Units 02/14/2021 12/25/2020 12/24/2020  Glucose 70 - 99 mg/dL 130(H) 341(H) 307(H)  BUN 6 - 20 mg/dL 122(H) 77(H) 42(H)  Creatinine 0.61 - 1.24 mg/dL 18.42(H) 11.72(H) 8.63(H)  Sodium 135 - 145 mmol/L 133(L)  133(L) 134(L)  Potassium 3.5 - 5.1 mmol/L 6.7(HH) 5.0 4.5  Chloride 98 - 111 mmol/L 93(L) 89(L) 93(L)  CO2 22 - 32 mmol/L 22 24 25   Calcium 8.9 - 10.3 mg/dL 9.2 9.1 8.9  Total Protein 6.5 - 8.1 g/dL - 7.4 7.9  Total Bilirubin 0.3 - 1.2 mg/dL - 0.5 0.7  Alkaline Phos 38 - 126 U/L - 80 69  AST 15 - 41 U/L - 15 16  ALT 0 - 44 U/L - 23 21    Renal function CrCl cannot be calculated (Patient's most recent lab result is older than the maximum 21 days allowed.).  Hgb A1c MFr Bld (%)  Date Value  12/22/2020 7.3 (H)    Direct LDL  Date Value Ref Range Status  06/25/2019 80.0 mg/dL Final    Comment:    Optimal:  <100 mg/dLNear or Above Optimal:  100-129 mg/dLBorderline High:   130-159 mg/dLHigh:  160-189 mg/dLVery High:  >190 mg/dL     Yevonne Aline. Stanford Breed, MD [facility] Phone Number: [phone] 07/10/2021 9:31 PM  Total time spent on preparing this encounter including chart review, data review, collecting history, examining the patient, coordinating care for this established patient, 40 minutes.  Portions of this report may have been transcribed using voice recognition software.  Every effort has been made to ensure accuracy; however, inadvertent computerized transcription errors may still be present.

## 2021-07-11 ENCOUNTER — Ambulatory Visit (INDEPENDENT_AMBULATORY_CARE_PROVIDER_SITE_OTHER): Payer: Federal, State, Local not specified - PPO | Admitting: Vascular Surgery

## 2021-07-11 ENCOUNTER — Other Ambulatory Visit: Payer: Self-pay

## 2021-07-11 ENCOUNTER — Encounter: Payer: Self-pay | Admitting: Vascular Surgery

## 2021-07-11 ENCOUNTER — Ambulatory Visit (INDEPENDENT_AMBULATORY_CARE_PROVIDER_SITE_OTHER): Payer: Federal, State, Local not specified - PPO | Admitting: Gastroenterology

## 2021-07-11 ENCOUNTER — Encounter: Payer: Self-pay | Admitting: Gastroenterology

## 2021-07-11 VITALS — BP 110/70 | HR 96 | Ht 66.0 in | Wt 210.0 lb

## 2021-07-11 VITALS — BP 121/88 | HR 90 | Temp 98.4°F | Resp 20 | Ht 66.0 in | Wt 210.0 lb

## 2021-07-11 DIAGNOSIS — K219 Gastro-esophageal reflux disease without esophagitis: Secondary | ICD-10-CM | POA: Diagnosis not present

## 2021-07-11 DIAGNOSIS — R110 Nausea: Secondary | ICD-10-CM

## 2021-07-11 DIAGNOSIS — Z992 Dependence on renal dialysis: Secondary | ICD-10-CM | POA: Diagnosis not present

## 2021-07-11 DIAGNOSIS — N186 End stage renal disease: Secondary | ICD-10-CM

## 2021-07-11 DIAGNOSIS — K5900 Constipation, unspecified: Secondary | ICD-10-CM | POA: Diagnosis not present

## 2021-07-11 NOTE — Patient Instructions (Signed)
Change taking Miralax to every day. Not as needed.   Take your Zofran daily before dinner every day and as needed.   Call our office or my chart message our office if your symptoms are not better.  The [place] [facility] providers would like to encourage you to use [facility] to communicate with providers for non-urgent requests or questions.  Due to long hold times on the telephone, sending your provider a message by [facility] may be a faster and more efficient way to get a response.  Please allow 48 business hours for a response.  Please remember that this is for non-urgent requests.   Thank you for choosing me and [facility].  Pricilla Riffle. Dagoberto Ligas., MD., [facility]

## 2021-07-11 NOTE — Progress Notes (Signed)
    History of Present Illness: This is a 50 year old male with GERD and IBS.  His last office visit was April 19, 2021 and refer to that note for additional background information.  He reports nausea developing after starting his evening meal meal which causes him to discontinue eating.  He relates no difficulties eating her breakfast or lunch.  He is unsure about which medications he takes in the evening before dinner as his wife handles his medications.  He frequently has constipation - often has 2 to 3 days between bowel movements.  MiraLAX has not been effective.  Occasionally has a loose stool.  Current Medications, Allergies, Past Medical History, Past Surgical History, Family History and Social History were reviewed in [employer] record.   Physical Exam: General: Well developed, well nourished, no acute distress Head: Normocephalic and atraumatic Eyes: Sclerae anicteric, EOMI Ears: Normal auditory acuity Mouth: Not examined, mask on during Covid-19 pandemic Lungs: Clear throughout to auscultation Heart: Regular rate and rhythm; no murmurs, rubs or bruits Abdomen: Soft, non tender and non distended. No masses, hepatosplenomegaly or hernias noted. Normal Bowel sounds Rectal: Not done Musculoskeletal: Symmetrical with no gross deformities  Pulses:  Normal pulses noted Extremities: No clubbing, cyanosis, edema or deformities noted Neurological: Alert oriented x 4, grossly nonfocal Psychological:  Alert and cooperative. Normal mood and affect   Assessment and Recommendations:  Nausea, early satiety, GERD.  Follow antireflux measures and continue omeprazole 40 mg twice daily taken before breakfast and before his evening meal.  Advised to determine which medication he eats he takes in the afternoon and before his evening meal.  Begin Zofran 4 mg p.o. 1/2-hour before his evening meal for now.  Notify us if symptoms are not improving we will look more closely at  his medications. REV in 6 months. IBS-C.  Change MiraLAX from daily as needed to daily scheduled dose.  If not adequately controlling symptoms can increase to twice daily.  Continue dicyclomine 20 mg p.o. twice daily. REV in 6 months.  ESRD on HD CHF with EF=45-50% DM

## 2021-07-12 DIAGNOSIS — Z992 Dependence on renal dialysis: Secondary | ICD-10-CM | POA: Diagnosis not present

## 2021-07-12 DIAGNOSIS — N2581 Secondary hyperparathyroidism of renal origin: Secondary | ICD-10-CM | POA: Diagnosis not present

## 2021-07-12 DIAGNOSIS — E1022 Type 1 diabetes mellitus with diabetic chronic kidney disease: Secondary | ICD-10-CM | POA: Diagnosis not present

## 2021-07-12 DIAGNOSIS — N186 End stage renal disease: Secondary | ICD-10-CM | POA: Diagnosis not present

## 2021-07-12 DIAGNOSIS — T8249XA Other complication of vascular dialysis catheter, initial encounter: Secondary | ICD-10-CM | POA: Diagnosis not present

## 2021-07-14 DIAGNOSIS — T8249XA Other complication of vascular dialysis catheter, initial encounter: Secondary | ICD-10-CM | POA: Diagnosis not present

## 2021-07-14 DIAGNOSIS — N2581 Secondary hyperparathyroidism of renal origin: Secondary | ICD-10-CM | POA: Diagnosis not present

## 2021-07-14 DIAGNOSIS — N186 End stage renal disease: Secondary | ICD-10-CM | POA: Diagnosis not present

## 2021-07-14 DIAGNOSIS — R0981 Nasal congestion: Secondary | ICD-10-CM | POA: Diagnosis not present

## 2021-07-14 DIAGNOSIS — U071 COVID-19: Secondary | ICD-10-CM | POA: Diagnosis not present

## 2021-07-14 DIAGNOSIS — Z992 Dependence on renal dialysis: Secondary | ICD-10-CM | POA: Diagnosis not present

## 2021-07-17 DIAGNOSIS — T8249XA Other complication of vascular dialysis catheter, initial encounter: Secondary | ICD-10-CM | POA: Diagnosis not present

## 2021-07-17 DIAGNOSIS — Z992 Dependence on renal dialysis: Secondary | ICD-10-CM | POA: Diagnosis not present

## 2021-07-17 DIAGNOSIS — N2581 Secondary hyperparathyroidism of renal origin: Secondary | ICD-10-CM | POA: Diagnosis not present

## 2021-07-17 DIAGNOSIS — N186 End stage renal disease: Secondary | ICD-10-CM | POA: Diagnosis not present

## 2021-07-19 DIAGNOSIS — T8249XA Other complication of vascular dialysis catheter, initial encounter: Secondary | ICD-10-CM | POA: Diagnosis not present

## 2021-07-19 DIAGNOSIS — N186 End stage renal disease: Secondary | ICD-10-CM | POA: Diagnosis not present

## 2021-07-19 DIAGNOSIS — N2581 Secondary hyperparathyroidism of renal origin: Secondary | ICD-10-CM | POA: Diagnosis not present

## 2021-07-19 DIAGNOSIS — Z992 Dependence on renal dialysis: Secondary | ICD-10-CM | POA: Diagnosis not present

## 2021-07-21 DIAGNOSIS — N2581 Secondary hyperparathyroidism of renal origin: Secondary | ICD-10-CM | POA: Diagnosis not present

## 2021-07-21 DIAGNOSIS — T8249XA Other complication of vascular dialysis catheter, initial encounter: Secondary | ICD-10-CM | POA: Diagnosis not present

## 2021-07-21 DIAGNOSIS — N186 End stage renal disease: Secondary | ICD-10-CM | POA: Diagnosis not present

## 2021-07-21 DIAGNOSIS — Z992 Dependence on renal dialysis: Secondary | ICD-10-CM | POA: Diagnosis not present

## 2021-07-24 DIAGNOSIS — N186 End stage renal disease: Secondary | ICD-10-CM | POA: Diagnosis not present

## 2021-07-24 DIAGNOSIS — T8249XA Other complication of vascular dialysis catheter, initial encounter: Secondary | ICD-10-CM | POA: Diagnosis not present

## 2021-07-24 DIAGNOSIS — Z992 Dependence on renal dialysis: Secondary | ICD-10-CM | POA: Diagnosis not present

## 2021-07-24 DIAGNOSIS — N2581 Secondary hyperparathyroidism of renal origin: Secondary | ICD-10-CM | POA: Diagnosis not present

## 2021-07-25 ENCOUNTER — Encounter: Payer: Self-pay | Admitting: Internal Medicine

## 2021-07-25 ENCOUNTER — Ambulatory Visit: Payer: Federal, State, Local not specified - PPO | Admitting: Internal Medicine

## 2021-07-25 VITALS — BP 120/80 | HR 100 | Ht 66.0 in | Wt 211.0 lb

## 2021-07-25 DIAGNOSIS — Z992 Dependence on renal dialysis: Secondary | ICD-10-CM

## 2021-07-25 DIAGNOSIS — E1122 Type 2 diabetes mellitus with diabetic chronic kidney disease: Secondary | ICD-10-CM | POA: Diagnosis not present

## 2021-07-25 DIAGNOSIS — N185 Chronic kidney disease, stage 5: Secondary | ICD-10-CM | POA: Diagnosis not present

## 2021-07-25 DIAGNOSIS — E1165 Type 2 diabetes mellitus with hyperglycemia: Secondary | ICD-10-CM | POA: Diagnosis not present

## 2021-07-25 DIAGNOSIS — Z794 Long term (current) use of insulin: Secondary | ICD-10-CM | POA: Diagnosis not present

## 2021-07-25 DIAGNOSIS — E113593 Type 2 diabetes mellitus with proliferative diabetic retinopathy without macular edema, bilateral: Secondary | ICD-10-CM

## 2021-07-25 DIAGNOSIS — E785 Hyperlipidemia, unspecified: Secondary | ICD-10-CM

## 2021-07-25 DIAGNOSIS — R0989 Other specified symptoms and signs involving the circulatory and respiratory systems: Secondary | ICD-10-CM | POA: Diagnosis not present

## 2021-07-25 DIAGNOSIS — E1022 Type 1 diabetes mellitus with diabetic chronic kidney disease: Secondary | ICD-10-CM

## 2021-07-25 DIAGNOSIS — N186 End stage renal disease: Secondary | ICD-10-CM

## 2021-07-25 LAB — POCT GLYCOSYLATED HEMOGLOBIN (HGB A1C): Hemoglobin A1C: 9.3 % — AB (ref 4.0–5.6)

## 2021-07-25 MED ORDER — OZEMPIC (1 MG/DOSE) 4 MG/3ML ~~LOC~~ SOPN: 1.0000 mg | PEN_INJECTOR | SUBCUTANEOUS | 3 refills | Status: DC

## 2021-07-25 MED ORDER — INSULIN GLARGINE 100 UNIT/ML ~~LOC~~ SOLN: 42.0000 [IU] | Freq: Every day | SUBCUTANEOUS | 5 refills | Status: DC

## 2021-07-25 NOTE — Progress Notes (Signed)
Name: Hector Mahoney  MRN/ DOB: [id number], 1970-11-26   Age/ Sex: 50 y.o., male    PCP: Biagio Borg, MD   Reason for Endocrinology Evaluation: Type 2 Diabetes Mellitus     Date of Initial Endocrinology Visit: 07/25/2021     PATIENT IDENTIFIER: [name] is a 50 y.o. male with a past medical history of T2DM, HTN and Dyslipidemia . The patient presented for initial endocrinology clinic visit on 07/25/2021 for consultative assistance with his diabetes management.    HPI: Hector Mahoney was    Diagnosed with DM in 1998  Prior Medications tried/Intolerance: Has been on insulin since his diagnosis.  Currently checking blood sugars multiple x / day,  through CGM .  Hypoglycemia episodes : yes               Symptoms: yes                 Frequency: rare Hemoglobin A1c has ranged from 7.3% in 2019, peaking at 11.4% in 2014. Patient required assistance for hypoglycemia:  Patient has required hospitalization within the last 1 year from hyper or hypoglycemia: no  In terms of diet, the patient eats 3 meals a day   Dialysis days Monday Wednesday and Friday . He is on two transplant lists   Drinks juice and sweet tea   Had recent cataract sx  Has feet numbing at night, pending neurology appointment  Follows with the [place]: Lantus 38  units daily  Novolog 8-12 units with meals .    Statin: yes ACE-I/ARB: yes    CONTINUOUS GLUCOSE MONITORING RECORD INTERPRETATION    Dates of Recording: 11/30-12/13/2022  Sensor description: dexcom   Results statistics:   CGM use % of time 21  Average and SD 250/69  Time in range    14    %  % Time Above 180 49  % Time above 250 37  % Time Below target 0      Glycemic patterns summary: High glucose during the day and night   Hyperglycemic episodes  all day and night, worse post prandial   Hypoglycemic episodes occurred post bolus   Overnight periods: high       DIABETIC  COMPLICATIONS: Microvascular complications:  DR ( on monthly injections ), ESRD on HD  Last eye exam: Completed 06/2021  Macrovascular complications:  CHF Denies: CAD, PVD, CVA   PAST HISTORY: Past Medical History:  Past Medical History:  Diagnosis Date   Anemia    CHF (congestive heart failure) ([place])    DIABETES MELLITUS, TYPE I 07/30/2007   Esophageal reflux 04/27/2009   ESRD (end stage renal disease) on dialysis [facility])    home HD 4x/week   HYPERLIPIDEMIA 04/24/2007   HYPERTENSION 04/24/2007   Past Surgical History:  Past Surgical History:  Procedure Laterality Date   AV FISTULA PLACEMENT Left 06/04/2019   Procedure: BRACHIAL-CEPHALIC ARTERIOVENOUS (AV) FISTULA CREATION LEFT ARM;  Surgeon: Rosetta Posner, MD;  Location: [facility] OR;  Service: Vascular;  Laterality: Left;   COLONOSCOPY     polyp   IR FLUORO GUIDE CV LINE RIGHT  10/22/2017   IR THROMBECTOMY AV FISTULA W/THROMBOLYSIS/PTA/STENT INC/SHUNT/IMG LT Left 03/2021   IR US GUIDE VASC ACCESS RIGHT  10/22/2017   MOUTH SURGERY     tooth ext    Social History:  reports that he quit smoking about 6 years ago. His smoking use included cigarettes. He has never  used smokeless tobacco. He reports that he does not currently use alcohol. He reports that he does not use drugs. Family History:  Family History  Problem Relation Age of Onset   Hypertension Mother    Diabetes Mother    Diabetes Maternal Aunt    Lung cancer Maternal Grandfather    Colon polyps Maternal Uncle    Colon cancer Maternal Uncle    Heart disease Maternal Uncle    Kidney disease Neg Hx    Esophageal cancer Neg Hx    Rectal cancer Neg Hx    Stomach cancer Neg Hx      HOME MEDICATIONS: Allergies as of 07/25/2021   No Known Allergies      Medication List        Accurate as of July 25, 2021  2:50 PM. If you have any questions, ask your nurse or doctor.          albuterol 108 (90 Base) MCG/ACT inhaler Commonly known as: VENTOLIN HFA Inhale 2  puffs into the lungs every 6 (six) hours as needed for wheezing or shortness of breath.   aspirin 81 MG EC tablet Take by mouth.   atorvastatin 80 MG tablet Commonly known as: LIPITOR Take 80 mg by mouth daily.   Auryxia 1 GM 210 MG(Fe) tablet Generic drug: ferric citrate Take 420 mg by mouth 3 (three) times daily.   calcitRIOL 0.25 MCG capsule Commonly known as: Rocaltrol Take 1 capsule (0.25 mcg total) by mouth every Monday, Wednesday, and Friday at 6 PM.   cetirizine 10 MG tablet Commonly known as: ZYRTEC Take 1 tablet by mouth once daily   cinacalcet 30 MG tablet Commonly known as: SENSIPAR Take 60 mg by mouth every evening.   dicyclomine 20 MG tablet Commonly known as: BENTYL Take 1 tablet (20 mg total) by mouth in the morning and at bedtime.   FreeStyle Libre 14 Day Sensor Misc 1 each by Other route daily.   gabapentin 300 MG capsule Commonly known as: NEURONTIN TAKE 1 TO 2 CAPSULES BY MOUTH AT BEDTIME AS NEEDED   insulin aspart 100 UNIT/ML injection Commonly known as: novoLOG INJECT 8 TO 12 UNITS INTO THE SKIN THREE TIMES DAILY WITH MEALS What changed: See the new instructions.   insulin glargine 100 UNIT/ML injection Commonly known as: Lantus Inject 0.25 mLs (25 Units total) into the skin at bedtime. What changed: how much to take   levETIRAcetam 250 MG tablet Commonly known as: Keppra Take 1 tablet (250 mg total) by mouth at bedtime.   losartan 50 MG tablet Commonly known as: COZAAR Take 1 tablet by mouth daily.   meclizine 12.5 MG tablet Commonly known as: ANTIVERT TAKE 1 TABLET BY MOUTH THREE TIMES DAILY AS NEEDED FOR DIZZINESS What changed: See the new instructions.   metoprolol succinate 25 MG 24 hr tablet Commonly known as: TOPROL-XL Take 25 mg by mouth daily.   multivitamin Tabs tablet Take 1 tablet by mouth daily.   Norvasc 10 MG tablet Generic drug: amLODipine Take 10 mg by mouth at bedtime.   omeprazole 40 MG capsule Commonly  known as: PRILOSEC Take 1 capsule (40 mg total) by mouth 2 (two) times daily.   ondansetron 4 MG tablet Commonly known as: Zofran Take 1 tablet (4 mg total) by mouth every 8 (eight) hours as needed for nausea or vomiting.   Ozempic (0.25 or 0.5 MG/DOSE) 2 MG/1.5ML Sopn Generic drug: Semaglutide(0.25 or 0.5MG /DOS) Inject 0.5 mg into the skin once a week.  polyethylene glycol powder 17 GM/SCOOP powder Commonly known as: GLYCOLAX/MIRALAX Take 1 Container by mouth daily as needed for mild constipation.   rOPINIRole 1 MG tablet Commonly known as: REQUIP Take 1 mg by mouth at bedtime.   rosuvastatin 10 MG tablet Commonly known as: CRESTOR Take 1 tablet (10 mg total) by mouth daily.   triamcinolone 55 MCG/ACT Aero nasal inhaler Commonly known as: NASACORT Place 2 sprays into the nose daily. What changed:  when to take this reasons to take this   VITAMIN D BOOSTER PO Take 1 mg by mouth 3 (three) times a week.         ALLERGIES: No Known Allergies   REVIEW OF SYSTEMS: A comprehensive ROS was conducted with the patient and is negative except as per HPI and below:  Review of Systems  Gastrointestinal:  Negative for diarrhea, nausea and vomiting.  Neurological:  Positive for tingling.     OBJECTIVE:   VITAL SIGNS: BP 120/80 (BP Location: Right Arm, Patient Position: Sitting, Cuff Size: Small)    Pulse 100    Ht 5\' 6"  (1.676 m)    Wt 211 lb (95.7 kg)    SpO2 99%    BMI 34.06 kg/m    PHYSICAL EXAM:  General: Pt appears well and is in NAD  Neck: General: Supple without adenopathy or carotid bruits. Thyroid: Thyroid size normal.  No goiter or nodules appreciated.  Lungs: Clear with good BS bilat with no rales, rhonchi, or wheezes  Heart: RRR with normal S1 and S2 and no gallops; no murmurs; no rub  Abdomen: Normoactive bowel sounds, soft, nontender, without masses or organomegaly palpable  Extremities:  Lower extremities - No pretibial edema.   Skin: Normal texture and  temperature to palpation. No rash noted. No Acanthosis nigricans/skin tags. No lipohypertrophy.  Neuro: MS is good with appropriate affect, pt is alert and Ox3    DM foot exam: 07/25/2021  The skin of the feet is without sores or ulcerations.Callous formation at the 1st MT head B/L  The pedal pulses are undetectable  The sensation is decreased  to a screening 5.07, 10 gram monofilament on the left     DATA REVIEWED:  Lab Results  Component Value Date   HGBA1C 9.3 (A) 07/25/2021   HGBA1C 7.3 (H) 12/22/2020   HGBA1C 9.3 (H) 06/25/2019   Lab Results  Component Value Date   CREATININE 18.42 (H) 02/14/2021   No results found for: [facility]  Lab Results  Component Value Date   CHOL 183 06/25/2019   HDL 58.40 06/25/2019   LDLDIRECT 80.0 06/25/2019   TRIG 214.0 (H) 06/25/2019   CHOLHDL 3 06/25/2019        ASSESSMENT / PLAN / RECOMMENDATIONS:   1) Type 2 Diabetes Mellitus, Poorly controlled, With neuropathic, retinopathic  complications and ESRD on HD  - Most recent A1c of 9.3 %. Goal A1c < 7.0 %.    Plan: GENERAL: I have discussed with the patient the pathophysiology of diabetes. We went over the natural progression of the disease. We talked about both insulin resistance and insulin deficiency. We stressed the importance of lifestyle changes including diet and exercise. I explained the complications associated with diabetes including retinopathy, nephropathy, neuropathy as well as increased risk of cardiovascular disease. We went over the benefit seen with glycemic control.   I explained to the patient that diabetic patients are at higher than normal risk for amputations. Unfortunately, he already has complication but he is on the transplant list  and I have encouraged him to improve glycemic control  Will increase Ozempic  Will adjust his insulin as below  He is not sure if the insulin pump is for him or not, I have asked him to look up the Omnipod 5  He has dietary  indiscretions with snacks , we used this as a teaching moment  He gets his insulin from the [place]   MEDICATIONS: - Increase Ozempic 1 mg weekly  - Increase Lantus to 42 units daily  - Novolog 12 units with each meal  Correction scale : Novolog (BG -130/25)   EDUCATION / INSTRUCTIONS: BG monitoring instructions: Patient is instructed to check his blood sugars 3 times a day, before meals . Call [facility] [facility] if: BG persistently < 70  I reviewed the Rule of 15 for the treatment of hypoglycemia in detail with the patient. Literature supplied.   2) Diabetic complications:  Eye: Does  have known diabetic retinopathy.  Neuro/ Feet: Does  have known diabetic peripheral neuropathy. Renal: Patient does  have known baseline CKD. He is  on an ACEI/ARB at present.   3) Decrease [place] Pedis Pulses :   - He has tingling and discomfort at night. He has an upcoming appointment with neurology  - Will proceed with ABI    4) Dyslipidemia    - LDL at goal  - Despite statin therapy has not shows to improve mortality in Hemodialysis pt but I would like to keep him on it due to future of renal transplant.   Continue atorvastatin 80 mg daily   F/U in 4 months            Signed electronically by: Mack Guise, MD  [facility]  [facility] [address] [place], [place] [address] Phone: [phone] FAX: [phone]   CC: Biagio Borg, [place] [address] Phone: [phone]  Fax: [phone]    Return to Endocrinology clinic as below: Future Appointments  Date Time Provider [place]  07/25/2021  4:00 PM Biagio Borg, MD [facility] None

## 2021-07-25 NOTE — Patient Instructions (Addendum)
-   Increase Ozempic 1 mg weekly  - Increase Lantus to 42 units daily  - Novolog 12 units with each meal  - Novolog correctional insulin: ADD extra units on insulin to your meal-time Novolog dose if your blood sugars are higher than 155. Use the scale below to help guide you:   Blood sugar before meal Number of units to inject  Less than 155 0 unit  156 -  180 1 units  181 -  205 2 units  206 -  230 3 units  231 -  255 4 units  256 -  280 5 units  281 -  305 6 units  306 -  330 7 units  331 -  355 8 units  356 - 380 9 units   381 - 405 10 units      Check out Omnipod 5   HOW TO TREAT LOW BLOOD SUGARS (Blood sugar LESS THAN 70 MG/DL) Please follow the RULE OF 15 for the treatment of hypoglycemia treatment (when your (blood sugars are less than 70 mg/dL)   STEP 1: Take 15 grams of carbohydrates when your blood sugar is low, which includes:  3-4 GLUCOSE TABS  OR 3-4 OZ OF JUICE OR REGULAR SODA OR ONE TUBE OF GLUCOSE GEL    STEP 2: RECHECK blood sugar in 15 MINUTES STEP 3: If your blood sugar is still low at the 15 minute recheck --> then, go back to STEP 1 and treat AGAIN with another 15 grams of carbohydrates.

## 2021-07-26 ENCOUNTER — Other Ambulatory Visit: Payer: Self-pay | Admitting: Internal Medicine

## 2021-07-26 ENCOUNTER — Encounter (HOSPITAL_COMMUNITY): Payer: Self-pay | Admitting: Vascular Surgery

## 2021-07-26 DIAGNOSIS — T8249XA Other complication of vascular dialysis catheter, initial encounter: Secondary | ICD-10-CM | POA: Diagnosis not present

## 2021-07-26 DIAGNOSIS — N2581 Secondary hyperparathyroidism of renal origin: Secondary | ICD-10-CM | POA: Diagnosis not present

## 2021-07-26 DIAGNOSIS — N186 End stage renal disease: Secondary | ICD-10-CM | POA: Diagnosis not present

## 2021-07-26 DIAGNOSIS — Z992 Dependence on renal dialysis: Secondary | ICD-10-CM | POA: Diagnosis not present

## 2021-07-26 NOTE — Progress Notes (Signed)
Per Mr. Casteneda, his surgery is being rescheduled to 08/21/21.

## 2021-07-26 NOTE — Anesthesia Preprocedure Evaluation (Addendum)
Anesthesia Evaluation  Patient identified by MRN, date of birth, ID band Patient awake    Reviewed: Allergy & Precautions, NPO status , Patient's Chart, lab work & pertinent test results  Airway Mallampati: II  TM Distance: >3 FB Neck ROM: Full    Dental no notable dental hx.    Pulmonary neg pulmonary ROS, former smoker,    Pulmonary exam normal        Cardiovascular hypertension, Pt. on medications and Pt. on home beta blockers +CHF   Rhythm:Regular Rate:Normal     Neuro/Psych negative neurological ROS  negative psych ROS   GI/Hepatic Neg liver ROS, GERD  Medicated,  Endo/Other  diabetes, Type 1, Insulin Dependent  Renal/GU ESRFRenal disease  negative genitourinary   Musculoskeletal negative musculoskeletal ROS (+)   Abdominal Normal abdominal exam  (+)   Peds  Hematology  (+) anemia ,   Anesthesia Other Findings   Reproductive/Obstetrics                           Anesthesia Physical Anesthesia Plan  ASA: 3  Anesthesia Plan: General   Post-op Pain Management:    Induction: Intravenous  PONV Risk Score and Plan: 2 and Ondansetron, Dexamethasone, Midazolam and Treatment may vary due to age or medical condition  Airway Management Planned: Mask and Oral ETT  Additional Equipment: None  Intra-op Plan:   Post-operative Plan: Extubation in OR  Informed Consent: I have reviewed the patients History and Physical, chart, labs and discussed the procedure including the risks, benefits and alternatives for the proposed anesthesia with the patient or authorized representative who has indicated his/her understanding and acceptance.     Dental advisory given  Plan Discussed with: CRNA  Anesthesia Plan Comments: (Lab Results      Component                Value               Date                      WBC                      11.3 (H)            02/14/2021                HGB                       11.8 (L)            02/14/2021                HCT                      37.4 (L)            02/14/2021                MCV                      82.7                02/14/2021                PLT                      267  02/14/2021           Lab Results      Component                Value               Date                      NA                       133 (L)             02/14/2021                K                        6.7 (HH)            02/14/2021                CO2                      22                  02/14/2021                GLUCOSE                  130 (H)             02/14/2021                BUN                      122 (H)             02/14/2021                CREATININE               18.42 (H)           02/14/2021                CALCIUM                  9.2                 02/14/2021                GFRNONAA                 3 (L)               02/14/2021           TTE 03/01/21: 1. Left ventricular ejection fraction, by estimation, is 45 to 50%. The  left ventricle has mildly decreased function. The left ventricle  demonstrates global hypokinesis. Left ventricular diastolic parameters are  indeterminate.  2. Right ventricular systolic function is normal. The right ventricular  size is normal.  3. The mitral valve is normal in structure. No evidence of mitral valve  regurgitation. No evidence of mitral stenosis.  4. The aortic valve is normal in structure. Aortic valve regurgitation is  not visualized. No aortic stenosis is present.  5. The inferior vena cava is normal in size with greater than 50%  respiratory variability, suggesting right atrial pressure of 3 mmHg.   Lexiscan Sestamibi stress test 01/26/2019: Lexiscan stress test was performed. Stress  EKG is non-diagnostic, as this is pharmacological stress test. Pharmacological myocardial perfusion imaging is normal. Normal left ventricular systolic function.  Low risk study. )       Anesthesia Quick Evaluation

## 2021-07-27 ENCOUNTER — Telehealth: Payer: Self-pay

## 2021-07-27 HISTORY — DX: Type 2 diabetes mellitus without complications: E11.9

## 2021-07-27 NOTE — Telephone Encounter (Signed)
Spoke with patient today regarding PD cath placement scheduled for today. Patient stated he would like to reschedule surgery for non-dialysis day in Jan 2023. Surgery moved to 08/24/21. Instructions reviewed. Patient verbalized understanding.

## 2021-07-28 DIAGNOSIS — T8249XA Other complication of vascular dialysis catheter, initial encounter: Secondary | ICD-10-CM | POA: Diagnosis not present

## 2021-07-28 DIAGNOSIS — N2581 Secondary hyperparathyroidism of renal origin: Secondary | ICD-10-CM | POA: Diagnosis not present

## 2021-07-28 DIAGNOSIS — N186 End stage renal disease: Secondary | ICD-10-CM | POA: Diagnosis not present

## 2021-07-28 DIAGNOSIS — Z992 Dependence on renal dialysis: Secondary | ICD-10-CM | POA: Diagnosis not present

## 2021-07-31 ENCOUNTER — Ambulatory Visit (HOSPITAL_COMMUNITY): Payer: Federal, State, Local not specified - PPO

## 2021-07-31 DIAGNOSIS — N2581 Secondary hyperparathyroidism of renal origin: Secondary | ICD-10-CM | POA: Diagnosis not present

## 2021-07-31 DIAGNOSIS — T8249XA Other complication of vascular dialysis catheter, initial encounter: Secondary | ICD-10-CM | POA: Diagnosis not present

## 2021-07-31 DIAGNOSIS — N186 End stage renal disease: Secondary | ICD-10-CM | POA: Diagnosis not present

## 2021-07-31 DIAGNOSIS — Z992 Dependence on renal dialysis: Secondary | ICD-10-CM | POA: Diagnosis not present

## 2021-08-02 DIAGNOSIS — N2581 Secondary hyperparathyroidism of renal origin: Secondary | ICD-10-CM | POA: Diagnosis not present

## 2021-08-02 DIAGNOSIS — T8249XA Other complication of vascular dialysis catheter, initial encounter: Secondary | ICD-10-CM | POA: Diagnosis not present

## 2021-08-02 DIAGNOSIS — N186 End stage renal disease: Secondary | ICD-10-CM | POA: Diagnosis not present

## 2021-08-02 DIAGNOSIS — Z992 Dependence on renal dialysis: Secondary | ICD-10-CM | POA: Diagnosis not present

## 2021-08-04 DIAGNOSIS — N186 End stage renal disease: Secondary | ICD-10-CM | POA: Diagnosis not present

## 2021-08-04 DIAGNOSIS — N2581 Secondary hyperparathyroidism of renal origin: Secondary | ICD-10-CM | POA: Diagnosis not present

## 2021-08-04 DIAGNOSIS — T8249XA Other complication of vascular dialysis catheter, initial encounter: Secondary | ICD-10-CM | POA: Diagnosis not present

## 2021-08-04 DIAGNOSIS — Z992 Dependence on renal dialysis: Secondary | ICD-10-CM | POA: Diagnosis not present

## 2021-08-07 DIAGNOSIS — T8249XA Other complication of vascular dialysis catheter, initial encounter: Secondary | ICD-10-CM | POA: Diagnosis not present

## 2021-08-07 DIAGNOSIS — N186 End stage renal disease: Secondary | ICD-10-CM | POA: Diagnosis not present

## 2021-08-07 DIAGNOSIS — N2581 Secondary hyperparathyroidism of renal origin: Secondary | ICD-10-CM | POA: Diagnosis not present

## 2021-08-07 DIAGNOSIS — Z992 Dependence on renal dialysis: Secondary | ICD-10-CM | POA: Diagnosis not present

## 2021-08-09 DIAGNOSIS — N186 End stage renal disease: Secondary | ICD-10-CM | POA: Diagnosis not present

## 2021-08-09 DIAGNOSIS — N2581 Secondary hyperparathyroidism of renal origin: Secondary | ICD-10-CM | POA: Diagnosis not present

## 2021-08-09 DIAGNOSIS — T8249XA Other complication of vascular dialysis catheter, initial encounter: Secondary | ICD-10-CM | POA: Diagnosis not present

## 2021-08-09 DIAGNOSIS — Z992 Dependence on renal dialysis: Secondary | ICD-10-CM | POA: Diagnosis not present

## 2021-08-11 DIAGNOSIS — Z992 Dependence on renal dialysis: Secondary | ICD-10-CM | POA: Diagnosis not present

## 2021-08-11 DIAGNOSIS — N186 End stage renal disease: Secondary | ICD-10-CM | POA: Diagnosis not present

## 2021-08-11 DIAGNOSIS — T8249XA Other complication of vascular dialysis catheter, initial encounter: Secondary | ICD-10-CM | POA: Diagnosis not present

## 2021-08-11 DIAGNOSIS — N2581 Secondary hyperparathyroidism of renal origin: Secondary | ICD-10-CM | POA: Diagnosis not present

## 2021-08-12 DIAGNOSIS — N186 End stage renal disease: Secondary | ICD-10-CM | POA: Diagnosis not present

## 2021-08-12 DIAGNOSIS — Z992 Dependence on renal dialysis: Secondary | ICD-10-CM | POA: Diagnosis not present

## 2021-08-12 DIAGNOSIS — E1022 Type 1 diabetes mellitus with diabetic chronic kidney disease: Secondary | ICD-10-CM | POA: Diagnosis not present

## 2021-08-14 DIAGNOSIS — N186 End stage renal disease: Secondary | ICD-10-CM | POA: Diagnosis not present

## 2021-08-14 DIAGNOSIS — N2581 Secondary hyperparathyroidism of renal origin: Secondary | ICD-10-CM | POA: Diagnosis not present

## 2021-08-14 DIAGNOSIS — Z992 Dependence on renal dialysis: Secondary | ICD-10-CM | POA: Diagnosis not present

## 2021-08-14 DIAGNOSIS — T8249XA Other complication of vascular dialysis catheter, initial encounter: Secondary | ICD-10-CM | POA: Diagnosis not present

## 2021-08-16 DIAGNOSIS — Z992 Dependence on renal dialysis: Secondary | ICD-10-CM | POA: Diagnosis not present

## 2021-08-16 DIAGNOSIS — N186 End stage renal disease: Secondary | ICD-10-CM | POA: Diagnosis not present

## 2021-08-16 DIAGNOSIS — T8249XA Other complication of vascular dialysis catheter, initial encounter: Secondary | ICD-10-CM | POA: Diagnosis not present

## 2021-08-16 DIAGNOSIS — N2581 Secondary hyperparathyroidism of renal origin: Secondary | ICD-10-CM | POA: Diagnosis not present

## 2021-08-18 DIAGNOSIS — N186 End stage renal disease: Secondary | ICD-10-CM | POA: Diagnosis not present

## 2021-08-18 DIAGNOSIS — N2581 Secondary hyperparathyroidism of renal origin: Secondary | ICD-10-CM | POA: Diagnosis not present

## 2021-08-18 DIAGNOSIS — T8249XA Other complication of vascular dialysis catheter, initial encounter: Secondary | ICD-10-CM | POA: Diagnosis not present

## 2021-08-18 DIAGNOSIS — Z992 Dependence on renal dialysis: Secondary | ICD-10-CM | POA: Diagnosis not present

## 2021-08-21 DIAGNOSIS — N2581 Secondary hyperparathyroidism of renal origin: Secondary | ICD-10-CM | POA: Diagnosis not present

## 2021-08-21 DIAGNOSIS — Z992 Dependence on renal dialysis: Secondary | ICD-10-CM | POA: Diagnosis not present

## 2021-08-21 DIAGNOSIS — N186 End stage renal disease: Secondary | ICD-10-CM | POA: Diagnosis not present

## 2021-08-21 DIAGNOSIS — T8249XA Other complication of vascular dialysis catheter, initial encounter: Secondary | ICD-10-CM | POA: Diagnosis not present

## 2021-08-23 ENCOUNTER — Encounter (HOSPITAL_COMMUNITY): Payer: Federal, State, Local not specified - PPO

## 2021-08-23 ENCOUNTER — Other Ambulatory Visit: Payer: Self-pay

## 2021-08-23 ENCOUNTER — Encounter (HOSPITAL_COMMUNITY): Payer: Self-pay | Admitting: Vascular Surgery

## 2021-08-23 DIAGNOSIS — N186 End stage renal disease: Secondary | ICD-10-CM | POA: Diagnosis not present

## 2021-08-23 DIAGNOSIS — Z992 Dependence on renal dialysis: Secondary | ICD-10-CM | POA: Diagnosis not present

## 2021-08-23 DIAGNOSIS — N2581 Secondary hyperparathyroidism of renal origin: Secondary | ICD-10-CM | POA: Diagnosis not present

## 2021-08-23 DIAGNOSIS — T8249XA Other complication of vascular dialysis catheter, initial encounter: Secondary | ICD-10-CM | POA: Diagnosis not present

## 2021-08-23 NOTE — Progress Notes (Signed)
PCP - Dr. Marshall Cork  Cardiologist - Dr. Marval Regal  EP- Denies  Endocrine- Dr. Kelton Pillar  Pulm- Denies  Chest x-ray - 02/14/21 (E)  EKG - 02/14/21 (E)  Stress Test - Denies  ECHO - 03/01/21 (E)  Cardiac Cath - Denies  AICD- na PM- na LOOP- na  Dialysis- M-W-F  Sleep Study - Denies CPAP - Denies  LABS- 08/24/21: I-Stat 8  ASA- Continue  ERAS- No  HA1C- 07/25/21: 9.3 Fasting Blood Sugar - 86-250 Checks Blood Sugar _4-6____ times a day, pt has a Dexcom attached to the abdomen RUQ  Anesthesia- Yes- elevated A1C  Pt denies having chest pain, sob, or fever during the pre-op phone call. All instructions explained to the pt, with a verbal understanding of the material including: as of today, stop taking all Aspirin (unless instructed by your doctor) and Other Aspirin containing products, Vitamins, Fish oils, and Herbal medications. Also stop all NSAIDS i.e. Advil, Ibuprofen, Motrin, Aleve, Anaprox, Naproxen, BC, Goody Powders, and all Supplements.   WHAT DO I DO ABOUT MY DIABETES MEDICATION?  THE NIGHT BEFORE SURGERY, take _____34______ units of _____Lantus______insulin.      The day of surgery, do not take Ozempic.  If your CBG is greater than 220 mg/dL, you may take 4-6 units of your Novolog insulin.  How do I manage my blood sugar before surgery? Check your blood sugar the morning of your surgery when you wake up and every 2 hours until you get to the Short Stay unit. If your blood sugar is less than 70 mg/dL, you will need to treat for low blood sugar: Do not take insulin. Treat a low blood sugar (less than 70 mg/dL) with  cup of clear juice (cranberry or apple), 4 glucose tablets, OR glucose gel. Recheck blood sugar in 15 minutes after treatment (to make sure it is greater than 70 mg/dL). If your blood sugar is not greater than 70 mg/dL on recheck, call [phone]  for further instructions. Report your blood sugar to the short stay nurse when you get to Short  Stay   Reviewed and Endorsed by [facility] Patient Education Committee, August 2015   Pt also instructed to wear a mask and social distance if he goes out. The opportunity to ask questions was provided.   Coronavirus Screening  Have you experienced the following symptoms:  Cough yes/no: No Fever (>100.22F)  yes/no: No Runny nose yes/no: No Sore throat yes/no: No Difficulty breathing/shortness of breath  yes/no: No  Have you or a family member traveled in the last 14 days and where? yes/no: No   If the patient indicates "YES" to the above questions, their PAT will be rescheduled to limit the exposure to others and, the surgeon will be notified. THE PATIENT WILL NEED TO BE ASYMPTOMATIC FOR 14 DAYS.   If the patient is not experiencing any of these symptoms, the PAT nurse will instruct them to NOT bring anyone with them to their appointment since they may have these symptoms or traveled as well.   Please remind your patients and families that hospital visitation restrictions are in effect and the importance of the restrictions.

## 2021-08-24 ENCOUNTER — Ambulatory Visit (HOSPITAL_COMMUNITY)
Admission: RE | Admit: 2021-08-24 | Discharge: 2021-08-24 | Disposition: A | Discharge status: Home / Self Care | Payer: Federal, State, Local not specified - PPO | Attending: Vascular Surgery | Admitting: Vascular Surgery

## 2021-08-24 ENCOUNTER — Encounter: Payer: Self-pay | Admitting: Vascular Surgery

## 2021-08-24 ENCOUNTER — Ambulatory Visit (HOSPITAL_COMMUNITY): Payer: Federal, State, Local not specified - PPO | Admitting: Physician Assistant

## 2021-08-24 ENCOUNTER — Encounter (HOSPITAL_COMMUNITY): Payer: Self-pay | Admitting: Vascular Surgery

## 2021-08-24 ENCOUNTER — Encounter (HOSPITAL_COMMUNITY)
Admission: RE | Disposition: A | Discharge status: Home / Self Care | Payer: Self-pay | Source: Home / Self Care | Attending: Vascular Surgery

## 2021-08-24 DIAGNOSIS — D631 Anemia in chronic kidney disease: Secondary | ICD-10-CM | POA: Diagnosis not present

## 2021-08-24 DIAGNOSIS — Z87891 Personal history of nicotine dependence: Secondary | ICD-10-CM | POA: Diagnosis not present

## 2021-08-24 DIAGNOSIS — E1022 Type 1 diabetes mellitus with diabetic chronic kidney disease: Secondary | ICD-10-CM | POA: Insufficient documentation

## 2021-08-24 DIAGNOSIS — Z992 Dependence on renal dialysis: Secondary | ICD-10-CM | POA: Diagnosis not present

## 2021-08-24 DIAGNOSIS — K660 Peritoneal adhesions (postprocedural) (postinfection): Secondary | ICD-10-CM | POA: Diagnosis not present

## 2021-08-24 DIAGNOSIS — I509 Heart failure, unspecified: Secondary | ICD-10-CM | POA: Insufficient documentation

## 2021-08-24 DIAGNOSIS — N186 End stage renal disease: Secondary | ICD-10-CM | POA: Insufficient documentation

## 2021-08-24 DIAGNOSIS — K219 Gastro-esophageal reflux disease without esophagitis: Secondary | ICD-10-CM | POA: Diagnosis not present

## 2021-08-24 DIAGNOSIS — I132 Hypertensive heart and chronic kidney disease with heart failure and with stage 5 chronic kidney disease, or end stage renal disease: Secondary | ICD-10-CM | POA: Insufficient documentation

## 2021-08-24 HISTORY — DX: Type 1 diabetes mellitus without complications: E10.9

## 2021-08-24 HISTORY — PX: CAPD INSERTION: SHX5233

## 2021-08-24 LAB — GLUCOSE, CAPILLARY
Glucose-Capillary: 242 mg/dL — ABNORMAL HIGH (ref 70–99)
Glucose-Capillary: 230 mg/dL — ABNORMAL HIGH (ref 70–99)
Glucose-Capillary: 218 mg/dL — ABNORMAL HIGH (ref 70–99)

## 2021-08-24 LAB — POCT I-STAT, CHEM 8
BUN: 41 mg/dL — ABNORMAL HIGH (ref 6–20)
Calcium, Ion: 0.99 mmol/L — ABNORMAL LOW (ref 1.15–1.40)
Chloride: 100 mmol/L (ref 98–111)
Creatinine, Ser: 9.7 mg/dL — ABNORMAL HIGH (ref 0.61–1.24)
Glucose, Bld: 221 mg/dL — ABNORMAL HIGH (ref 70–99)
HCT: 44 % (ref 39.0–52.0)
Hemoglobin: 15 g/dL (ref 13.0–17.0)
Potassium: 4.1 mmol/L (ref 3.5–5.1)
Sodium: 137 mmol/L (ref 135–145)
TCO2: 26 mmol/L (ref 22–32)

## 2021-08-24 SURGERY — LAPAROSCOPIC INSERTION CONTINUOUS AMBULATORY PERITONEAL DIALYSIS  (CAPD) CATHETER
Anesthesia: General

## 2021-08-24 MED ORDER — PROPOFOL 10 MG/ML IV BOLUS
INTRAVENOUS | Status: DC | PRN
Start: 2021-08-24 — End: 2021-08-24
  Administered 2021-08-24: 180 mg via INTRAVENOUS

## 2021-08-24 MED ORDER — FENTANYL CITRATE (PF) 250 MCG/5ML IJ SOLN
INTRAMUSCULAR | Status: AC
  Filled 2021-08-24: qty 5

## 2021-08-24 MED ORDER — CHLORHEXIDINE GLUCONATE 4 % EX LIQD: 60.0000 mL | Freq: Once | CUTANEOUS | Status: DC

## 2021-08-24 MED ORDER — SUGAMMADEX SODIUM 200 MG/2ML IV SOLN
INTRAVENOUS | Status: DC | PRN
Start: 2021-08-24 — End: 2021-08-24
  Administered 2021-08-24: 200 mg via INTRAVENOUS

## 2021-08-24 MED ORDER — FENTANYL CITRATE (PF) 100 MCG/2ML IJ SOLN: 25.0000 ug | INTRAMUSCULAR | Status: DC | PRN

## 2021-08-24 MED ORDER — LIDOCAINE 2% (20 MG/ML) 5 ML SYRINGE
INTRAMUSCULAR | Status: AC
  Filled 2021-08-24: qty 5

## 2021-08-24 MED ORDER — GLYCOPYRROLATE 0.2 MG/ML IJ SOLN
INTRAMUSCULAR | Status: DC | PRN
  Administered 2021-08-24: .2 mg via INTRAVENOUS

## 2021-08-24 MED ORDER — HYDROCODONE-ACETAMINOPHEN 5-325 MG PO TABS: 1.0000 | ORAL_TABLET | Freq: Four times a day (QID) | ORAL | 0 refills | Status: DC | PRN

## 2021-08-24 MED ORDER — AMISULPRIDE (ANTIEMETIC) 5 MG/2ML IV SOLN
10.0000 mg | Freq: Once | INTRAVENOUS | Status: AC
  Administered 2021-08-24: 10 mg via INTRAVENOUS

## 2021-08-24 MED ORDER — PHENYLEPHRINE HCL-NACL 20-0.9 MG/250ML-% IV SOLN
INTRAVENOUS | Status: DC | PRN
  Administered 2021-08-24: 50 ug/min via INTRAVENOUS

## 2021-08-24 MED ORDER — SODIUM CHLORIDE 0.9 % IR SOLN
Status: DC | PRN
  Administered 2021-08-24: 1000 mL

## 2021-08-24 MED ORDER — ROCURONIUM 10MG/ML (10ML) SYRINGE FOR MEDFUSION PUMP - OPTIME
INTRAVENOUS | Status: DC | PRN
  Administered 2021-08-24: 70 mg via INTRAVENOUS

## 2021-08-24 MED ORDER — SODIUM CHLORIDE 0.9 % IV SOLN: INTRAVENOUS | Status: DC

## 2021-08-24 MED ORDER — MIDAZOLAM HCL 2 MG/2ML IJ SOLN
INTRAMUSCULAR | Status: DC | PRN
  Administered 2021-08-24: 2 mg via INTRAVENOUS

## 2021-08-24 MED ORDER — ORAL CARE MOUTH RINSE: 15.0000 mL | Freq: Once | OROMUCOSAL | Status: AC

## 2021-08-24 MED ORDER — SODIUM CHLORIDE 0.9 % IR SOLN
Status: DC | PRN
  Administered 2021-08-24: 1

## 2021-08-24 MED ORDER — ACETAMINOPHEN 10 MG/ML IV SOLN: 1000.0000 mg | Freq: Once | INTRAVENOUS | Status: DC | PRN

## 2021-08-24 MED ORDER — ROCURONIUM BROMIDE 10 MG/ML (PF) SYRINGE
PREFILLED_SYRINGE | INTRAVENOUS | Status: AC
  Filled 2021-08-24: qty 10

## 2021-08-24 MED ORDER — INSULIN ASPART 100 UNIT/ML IJ SOLN
10.0000 [IU] | Freq: Once | INTRAMUSCULAR | Status: AC
  Administered 2021-08-24: 10 [IU] via SUBCUTANEOUS

## 2021-08-24 MED ORDER — FENTANYL CITRATE (PF) 250 MCG/5ML IJ SOLN
INTRAMUSCULAR | Status: DC | PRN
  Administered 2021-08-24 (×2): 50 ug via INTRAVENOUS

## 2021-08-24 MED ORDER — BUPIVACAINE-EPINEPHRINE 0.5% -1:200000 IJ SOLN
INTRAMUSCULAR | Status: AC
  Filled 2021-08-24: qty 1

## 2021-08-24 MED ORDER — LACTATED RINGERS IV SOLN: INTRAVENOUS | Status: DC | PRN

## 2021-08-24 MED ORDER — INSULIN ASPART 100 UNIT/ML IJ SOLN
INTRAMUSCULAR | Status: AC
  Filled 2021-08-24: qty 1

## 2021-08-24 MED ORDER — CEFAZOLIN SODIUM-DEXTROSE 2-4 GM/100ML-% IV SOLN
2.0000 g | INTRAVENOUS | Status: AC
  Administered 2021-08-24: 2 g via INTRAVENOUS
  Filled 2021-08-24: qty 100

## 2021-08-24 MED ORDER — ONDANSETRON HCL 4 MG/2ML IJ SOLN
INTRAMUSCULAR | Status: AC
  Filled 2021-08-24: qty 2

## 2021-08-24 MED ORDER — LIDOCAINE HCL (CARDIAC) PF 100 MG/5ML IV SOSY
PREFILLED_SYRINGE | INTRAVENOUS | Status: DC | PRN
Start: 2021-08-24 — End: 2021-08-24
  Administered 2021-08-24: 100 mg via INTRAVENOUS

## 2021-08-24 MED ORDER — ONDANSETRON HCL 4 MG/2ML IJ SOLN
INTRAMUSCULAR | Status: DC | PRN
  Administered 2021-08-24: 4 mg via INTRAVENOUS

## 2021-08-24 MED ORDER — PROPOFOL 10 MG/ML IV BOLUS
INTRAVENOUS | Status: AC
  Filled 2021-08-24: qty 20

## 2021-08-24 MED ORDER — PROMETHAZINE HCL 25 MG/ML IJ SOLN: 6.2500 mg | INTRAMUSCULAR | Status: DC | PRN

## 2021-08-24 MED ORDER — GLYCOPYRROLATE PF 0.2 MG/ML IJ SOSY
PREFILLED_SYRINGE | INTRAMUSCULAR | Status: AC
  Filled 2021-08-24: qty 1

## 2021-08-24 MED ORDER — BUPIVACAINE HCL (PF) 0.25 % IJ SOLN
INTRAMUSCULAR | Status: AC
  Filled 2021-08-24: qty 30

## 2021-08-24 MED ORDER — MIDAZOLAM HCL 2 MG/2ML IJ SOLN
INTRAMUSCULAR | Status: AC
  Filled 2021-08-24: qty 2

## 2021-08-24 MED ORDER — SODIUM CHLORIDE 0.9 % IV SOLN: INTRAVENOUS | Status: DC | PRN

## 2021-08-24 MED ORDER — CHLORHEXIDINE GLUCONATE 0.12 % MT SOLN: 15.0000 mL | Freq: Once | OROMUCOSAL | Status: AC

## 2021-08-24 MED ORDER — CHLORHEXIDINE GLUCONATE 0.12 % MT SOLN
OROMUCOSAL | Status: AC
  Administered 2021-08-24: 15 mL via OROMUCOSAL
  Filled 2021-08-24: qty 15

## 2021-08-24 MED ORDER — AMISULPRIDE (ANTIEMETIC) 5 MG/2ML IV SOLN
INTRAVENOUS | Status: AC
  Filled 2021-08-24: qty 4

## 2021-08-24 SURGICAL SUPPLY — 50 items
ADAPTER TITANIUM MEDIONICS (MISCELLANEOUS) ×3 IMPLANT
ADH SKN CLS APL DERMABOND .7 (GAUZE/BANDAGES/DRESSINGS) ×1
ADPR DLYS CATH STRL LF DISP (MISCELLANEOUS) ×1
APL PRP STRL LF DISP 70% ISPRP (MISCELLANEOUS) ×1
BAG DECANTER FOR FLEXI CONT (MISCELLANEOUS) ×3 IMPLANT
BIOPATCH RED 1 DISK 7.0 (GAUZE/BANDAGES/DRESSINGS) ×3 IMPLANT
BLADE CLIPPER SURG (BLADE) IMPLANT
CATH EXTENDED DIALYSIS (CATHETERS) ×3 IMPLANT
CHLORAPREP W/TINT 26 (MISCELLANEOUS) ×3 IMPLANT
COVER SURGICAL LIGHT HANDLE (MISCELLANEOUS) ×3 IMPLANT
DECANTER SPIKE VIAL GLASS SM (MISCELLANEOUS) ×3 IMPLANT
DERMABOND ADVANCED (GAUZE/BANDAGES/DRESSINGS) ×1
DERMABOND ADVANCED .7 DNX12 (GAUZE/BANDAGES/DRESSINGS) ×2 IMPLANT
DRSG TEGADERM 4X4.75 (GAUZE/BANDAGES/DRESSINGS) ×1 IMPLANT
ELECT REM PT RETURN 9FT ADLT (ELECTROSURGICAL) ×2
ELECTRODE REM PT RTRN 9FT ADLT (ELECTROSURGICAL) ×2 IMPLANT
GAUZE 4X4 16PLY ~~LOC~~+RFID DBL (SPONGE) ×1 IMPLANT
GAUZE SPONGE 4X4 12PLY STRL (GAUZE/BANDAGES/DRESSINGS) ×3 IMPLANT
GLOVE SURG POLYISO LF SZ8 (GLOVE) ×3 IMPLANT
GOWN STRL REUS W/ TWL LRG LVL3 (GOWN DISPOSABLE) ×6 IMPLANT
GOWN STRL REUS W/ TWL XL LVL3 (GOWN DISPOSABLE) ×2 IMPLANT
GOWN STRL REUS W/TWL LRG LVL3 (GOWN DISPOSABLE) ×6
GOWN STRL REUS W/TWL XL LVL3 (GOWN DISPOSABLE) ×2
GRASPER SUT TROCAR 14GX15 (MISCELLANEOUS) ×3 IMPLANT
IV NS 1000ML (IV SOLUTION) ×2
IV NS 1000ML BAXH (IV SOLUTION) ×2 IMPLANT
KIT BASIN OR (CUSTOM PROCEDURE TRAY) ×3 IMPLANT
KIT TURNOVER KIT B (KITS) ×3 IMPLANT
NDL INSUFFLATION 14GA 120MM (NEEDLE) ×2 IMPLANT
NEEDLE INSUFFLATION 14GA 120MM (NEEDLE) ×2 IMPLANT
NS IRRIG 1000ML POUR BTL (IV SOLUTION) ×3 IMPLANT
PAD ARMBOARD 7.5X6 YLW CONV (MISCELLANEOUS) ×6 IMPLANT
SCISSORS LAP 5X35 DISP (ENDOMECHANICALS) IMPLANT
SET CYSTO W/LG BORE CLAMP LF (SET/KITS/TRAYS/PACK) ×3 IMPLANT
SET EXT 12IN DIALYSIS STAY-SAF (MISCELLANEOUS) ×3 IMPLANT
SET TUBE SMOKE EVAC HIGH FLOW (TUBING) ×3 IMPLANT
SLEEVE ENDOPATH XCEL 5M (ENDOMECHANICALS) ×3 IMPLANT
STAPLER VISISTAT 35W (STAPLE) ×1 IMPLANT
STYLET FALLER (MISCELLANEOUS) ×3 IMPLANT
SUT MNCRL AB 4-0 PS2 18 (SUTURE) ×6 IMPLANT
SUT PROLENE 0 SH 30 (SUTURE) ×6 IMPLANT
SUT VIC AB 3-0 SH 27 (SUTURE)
SUT VIC AB 3-0 SH 27X BRD (SUTURE) IMPLANT
TAPE CLOTH SURG 4X10 WHT LF (GAUZE/BANDAGES/DRESSINGS) ×3 IMPLANT
TOWEL GREEN STERILE (TOWEL DISPOSABLE) ×3 IMPLANT
TOWEL GREEN STERILE FF (TOWEL DISPOSABLE) ×3 IMPLANT
TRAY LAPAROSCOPIC MC (CUSTOM PROCEDURE TRAY) ×3 IMPLANT
TROCAR 5MMX150MM (TROCAR) ×3 IMPLANT
TROCAR XCEL NON-BLD 5MMX100MML (ENDOMECHANICALS) ×3 IMPLANT
WATER STERILE IRR 1000ML POUR (IV SOLUTION) ×3 IMPLANT

## 2021-08-24 NOTE — Anesthesia Postprocedure Evaluation (Signed)
Anesthesia Post Note  Patient: Hector Mahoney  Procedure(s) Performed: LAPAROSCOPIC INSERTION CONTINUOUS AMBULATORY PERITONEAL DIALYSIS  (CAPD) CATHETER     Patient location during evaluation: PACU Anesthesia Type: General Level of consciousness: awake and alert Pain management: pain level controlled Vital Signs Assessment: post-procedure vital signs reviewed and stable Respiratory status: spontaneous breathing, nonlabored ventilation, respiratory function stable and patient connected to nasal cannula oxygen Cardiovascular status: blood pressure returned to baseline and stable Postop Assessment: no apparent nausea or vomiting Anesthetic complications: no   No notable events documented.  Last Vitals:  Vitals:   08/24/21 0915 08/24/21 0927  BP:  117/77  Pulse: 81 78  Resp: 14 16  Temp:  36.5 C  SpO2: 100% 100%    Last Pain:  Vitals:   08/24/21 0927  TempSrc:   PainSc: 0-No pain                 March Rummage Adrijana Haros

## 2021-08-24 NOTE — Op Note (Signed)
DATE OF SERVICE: 08/24/2021  PATIENT:  Hector Mahoney  51 y.o. male  PRE-OPERATIVE DIAGNOSIS:  ESRD  POST-OPERATIVE DIAGNOSIS:  Same  PROCEDURE:   1) diagnostic laparoscopy 2) laparoscopic placement of peritoneal dialysis catheter  SURGEON:  Surgeon(s) and Role:    * Cherre Robins, MD - Primary  ASSISTANT: Arlee Muslim, PA-C  An assistant was required to facilitate exposure and expedite the case.  ANESTHESIA:   general  EBL: minimal  BLOOD ADMINISTERED:none  DRAINS: PD cath  LOCAL MEDICATIONS USED:  NONE  SPECIMEN:  none  COUNTS: confirmed correct.  TOURNIQUET:  none  PATIENT DISPOSITION:  PACU - hemodynamically stable.   Delay start of Pharmacological VTE agent (>24hrs) due to surgical blood loss or risk of bleeding: no  INDICATION FOR PROCEDURE: Hector Mahoney is a 51 y.o. male with ESRD. He desires transition to peritoneal dialysis. After careful discussion of risks, benefits, and alternatives the patient was offered laparoscopic peritoneal dialysis catheter placement. We specifically discussed possibility of not being able to place the catheter given history of peritoneal Dialysis. The patient understood and wished to proceed.  OPERATIVE FINDINGS: Minimal adhesions in the abdomen.  Omentum was tacked to the anterior abdominal wall in the left upper quadrant, making omentopexy unnecessary.  Unremarkable placement of peritoneal dialysis catheter otherwise.  DESCRIPTION OF PROCEDURE: After identification of the patient in the pre-operative holding area, the patient was transferred to the operating room. The patient was positioned supine on the operating room table. Anesthesia was induced. The abdomen was prepped and draped in standard fashion. A surgical pause was performed confirming correct patient, procedure, and operative location.  Veress needle was inserted into the left upper quadrant in Palmer's point.  A saline drop test was used to confirm  intraperitoneal placement.  The Veress needle was connected to insufflation tubing.  An appropriate opening pressure and fluid were noted.  The abdomen insufflated appropriately.  Using VasoView technique a 5 mm trocar was inserted under direct vision in the right upper quadrant.  The abdomen was inspected with a 30 degree scope.  No evidence of Veress needle injury was noted.  It appeared safe to proceed with laparoscopic peritoneal dialysis catheter placement.  A tongue of omentum was already stuck to the anterior abdominal wall and a previous incision site.  I felt omentopexy to be unnecessary.  An additional 5 mm trocar was inserted into the right upper quadrant a handsbreadth away from the previous trocar.  The abdomen was desufflated.  The peritoneal dialysis catheter was measured on the skin.  An incision was planned at the periumbilical abdomen to allow the cuff to deliver into the rectus fascia.  The abdomen was reinsufflated.  An advantage 5 mm trocar was used to create a skiving tunnel between the periumbilical incision in the pelvis.  This was placed under direct vision.  The working end of the catheter was then delivered through the trocar into the pelvis.  A laparoscopic grasper was used to assist the catheter placement.  The cuff was pulled through the abdominal wall and then reseated into the rectus muscle.  The working end of the catheter was then connected to a swan-neck extension using a Christmas tree adapter.  The Christmas tree adapter was secured in place using 0 Prolene sutures.  A tunneling device was used to bring the connected catheter to a counterincision in the left upper quadrant.  Then the catheter was deflected downward to exit in the left upper quadrant in a downward  going manner.  The catheter was pulled to remove any twists or kinks.  The catheter was tested on the field.  Cystoscopy tubing was brought on the field and connected to the catheter.  The cystoscopy tubing  was connected to a liter of saline.  Excellent inflow was achieved.  The bag was dropped to the ground.  Excellent outflow was achieved.  The abdomen was reinsufflated and the tunnel site inspected to ensure it did not violate the peritoneum.  The abdomen was desufflated.  All laparoscopic trochars and equipment were removed.  The incisions were closed using simple 4-0 Monocryl suture and Dermabond.  Upon completion of the case instrument and sharps counts were confirmed correct. The patient was transferred to the PACU in good condition. I was present for all portions of the procedure.  Yevonne Aline. Stanford Breed, MD [facility] Phone Number: [phone] 08/24/2021 10:27 AM

## 2021-08-24 NOTE — Transfer of Care (Signed)
Immediate Anesthesia Transfer of Care Note  Patient: [name]  Procedure(s) Performed: LAPAROSCOPIC INSERTION CONTINUOUS AMBULATORY PERITONEAL DIALYSIS  (CAPD) CATHETER  Patient Location: PACU  Anesthesia Type:General  Level of Consciousness: drowsy, patient cooperative and responds to stimulation  Airway & Oxygen Therapy: Patient Spontanous Breathing and Patient connected to nasal cannula oxygen  Post-op Assessment: Report given to RN, Post -op Vital signs reviewed and stable and Patient moving all extremities X 4  Post vital signs: Reviewed and stable  Last Vitals:  Vitals Value Taken Time  BP 131/88 08/24/21 0842  Temp    Pulse 84 08/24/21 0844  Resp 14 08/24/21 0844  SpO2 98 % 08/24/21 0844  Vitals shown include unvalidated device data.  Last Pain:  Vitals:   08/24/21 0609  TempSrc:   PainSc: 0-No pain      Patients Stated Pain Goal: 2 (62/22/97 9892)  Complications: No notable events documented.

## 2021-08-24 NOTE — Anesthesia Procedure Notes (Signed)
Procedure Name: Intubation Date/Time: 08/24/2021 7:37 AM Performed by: Claris Che, CRNA Pre-anesthesia Checklist: Patient identified, Emergency Drugs available, Suction available, Patient being monitored and Timeout performed Patient Re-evaluated:Patient Re-evaluated prior to induction Oxygen Delivery Method: Circle system utilized Preoxygenation: Pre-oxygenation with 100% oxygen Induction Type: IV induction and Cricoid Pressure applied Ventilation: Mask ventilation without difficulty Laryngoscope Size: Mac and 4 Grade View: Grade II Tube type: Oral Tube size: 7.5 mm Number of attempts: 1 Airway Equipment and Method: Stylet Placement Confirmation: ETT inserted through vocal cords under direct vision, positive ETCO2 and breath sounds checked- equal and bilateral Secured at: 23 cm Tube secured with: Tape Dental Injury: Teeth and Oropharynx as per pre-operative assessment

## 2021-08-24 NOTE — H&P (Signed)
[facility] [place]  ASSESSMENT / PLAN: 51 y.o. male with ESRD who desires transition to peritoneal dialysis.  He has had a peritoneal dialysis catheter previously.  I counseled him that we may not be able to put in a new peritoneal dialysis catheter depending on the amount of scar in his belly.  We will start the case with a diagnostic laparoscopy.  If I feel it safe to proceed, we will proceed with omentopexy and peritoneal dialysis catheter placement.  We will arrange for a nondialysis day in the near future.  CHIEF COMPLAINT: End-stage renal disease, desires transition to peritoneal dialysis  HISTORY OF PRESENT ILLNESS: Hector Mahoney is a 51 y.o. male with end-stage renal disease.  He is dialyzing through a right internal jugular tunneled dialysis catheter on Monday, Wednesday, and Friday.  He has previously had a peritoneal dialysis catheter which worked well for him.  It became infected and had to be removed.  He has had no other abdominal surgeries before.  He is right-handed.  He was previously dialyzing through a left arm brachiocephalic arteriovenous fistula, but this is thrombosed.  Past Medical History:  Diagnosis Date   Anemia    CHF (congestive heart failure) (HCC)    Esophageal reflux 04/27/2009   ESRD (end stage renal disease) on dialysis [facility])    home HD 4x/week   HYPERLIPIDEMIA 04/24/2007   HYPERTENSION 04/24/2007   Insulin dependent type 1 diabetes mellitus ([place])     Past Surgical History:  Procedure Laterality Date   AV FISTULA PLACEMENT Left 06/04/2019   Procedure: BRACHIAL-CEPHALIC ARTERIOVENOUS (AV) FISTULA CREATION LEFT ARM;  Surgeon: Rosetta Posner, MD;  Location: [facility] OR;  Service: Vascular;  Laterality: Left;   COLONOSCOPY     polyp   IR FLUORO GUIDE CV LINE RIGHT  10/22/2017   IR THROMBECTOMY AV FISTULA W/THROMBOLYSIS/PTA/STENT INC/SHUNT/IMG LT Left 03/2021   IR US GUIDE VASC ACCESS RIGHT  10/22/2017   MOUTH SURGERY     tooth ext     Family History  Problem Relation Age of Onset   Hypertension Mother    Diabetes Mother    Diabetes Maternal Aunt    Lung cancer Maternal Grandfather    Colon polyps Maternal Uncle    Colon cancer Maternal Uncle    Heart disease Maternal Uncle    Kidney disease Neg Hx    Esophageal cancer Neg Hx    Rectal cancer Neg Hx    Stomach cancer Neg Hx     Social History   Socioeconomic History   Marital status: Married    Spouse name: Not on file   Number of children: 0   Years of education: Not on file   Highest education level: Not on file  Occupational History   Not on file  Tobacco Use   Smoking status: Former    Types: Cigarettes    Quit date: 05/03/2015    Years since quitting: 6.3   Smokeless tobacco: Never   Tobacco comments:    12/22/2012 "used to smoke cigarettes once or twice/month; haven't had any cigarettes for years"  Vaping Use   Vaping Use: Never used  Substance and Sexual Activity   Alcohol use: Not Currently    Comment: occasional    Drug use: No   Sexual activity: Yes  Other Topics Concern   Not on file  Social History Narrative   Right Handed   Lives in a one story home   Drinks little caffeine  Social Determinants of Health   Financial Resource Strain: Not on file  Food Insecurity: Not on file  Transportation Needs: Not on file  Physical Activity: Not on file  Stress: Not on file  Social Connections: Not on file  Intimate Partner Violence: Not on file    No Known Allergies  Current Facility-Administered Medications  Medication Dose Route Frequency Provider Last Rate Last Admin   0.9 %  sodium chloride infusion   Intravenous Continuous Cherre Robins, MD       0.9 %  sodium chloride infusion   Intravenous Continuous Stoltzfus, Gregory P, DO       ceFAZolin (ANCEF) IVPB 2g/100 mL premix  2 g Intravenous 30 min Pre-Op Cherre Robins, MD       chlorhexidine (HIBICLENS) 4 % liquid 4 application  60 mL Topical Once Cherre Robins, MD        And   [START ON 08/25/2021] chlorhexidine (HIBICLENS) 4 % liquid 4 application  60 mL Topical Once Cherre Robins, MD       Facility-Administered Medications Ordered in Other Encounters  Medication Dose Route Frequency Provider Last Rate Last Admin   lactated ringers infusion   Intravenous Continuous PRN Claris Che, CRNA   New Bag at 08/24/21 [phone]    REVIEW OF SYSTEMS:  [X]  denotes positive finding, [ ]  denotes negative finding Cardiac  Comments:  Chest pain or chest pressure:    Shortness of breath upon exertion:    Short of breath when lying flat:    Irregular heart rhythm:        Vascular    Pain in calf, thigh, or hip brought on by ambulation:    Pain in feet at night that wakes you up from your sleep:     Blood clot in your veins:    Leg swelling:         Pulmonary    Oxygen at home:    Productive cough:     Wheezing:         Neurologic    Sudden weakness in arms or legs:     Sudden numbness in arms or legs:     Sudden onset of difficulty speaking or slurred speech:    Temporary loss of vision in one eye:     Problems with dizziness:         Gastrointestinal    Blood in stool:     Vomited blood:         Genitourinary    Burning when urinating:     Blood in urine:        Psychiatric    Major depression:         Hematologic    Bleeding problems:    Problems with blood clotting too easily:        Skin    Rashes or ulcers:        Constitutional    Fever or chills:      PHYSICAL EXAM Vitals:   08/23/21 1155 08/24/21 0550  BP:  113/73  Pulse:  90  Resp:  18  Temp:  98.1 F (36.7 C)  TempSrc:  Oral  SpO2:  99%  Weight: 92.5 kg 93 kg  Height: 5\' 6"  (1.676 m) 5\' 6"  (1.676 m)    Constitutional: well appearing. no distress. Appears well nourished.  Neurologic: CN intact. no focal findings. no sensory loss. Psychiatric:  Mood and affect symmetric and appropriate. Eyes:  No icterus. No conjunctival  pallor. Ears, nose, throat:  mucous  membranes moist. Midline trachea.  Cardiac: regular rate and rhythm.  Respiratory:  unlabored. Abdominal:  soft, non-tender, non-distended. Scars consistent with prior lap PD catheter placement Peripheral vascular: LUE BC AVF thrombosed. Extremity: no edema. no cyanosis. no pallor.  Skin: no gangrene. no ulceration.  Lymphatic: no Stemmer's sign. no palpable lymphadenopathy.  PERTINENT LABORATORY AND RADIOLOGIC DATA  Most recent CBC CBC Latest Ref Rng & Units 08/24/2021 02/14/2021 12/25/2020  WBC 4.0 - 10.5 K/uL - 11.3(H) 16.2(H)  Hemoglobin 13.0 - 17.0 g/dL 15.0 11.8(L) 10.1(L)  Hematocrit 39.0 - 52.0 % 44.0 37.4(L) 30.6(L)  Platelets 150 - 400 K/uL - 267 380     Most recent CMP CMP Latest Ref Rng & Units 08/24/2021 02/14/2021 12/25/2020  Glucose 70 - 99 mg/dL 221(H) 130(H) 341(H)  BUN 6 - 20 mg/dL 41(H) 122(H) 77(H)  Creatinine 0.61 - 1.24 mg/dL 9.70(H) 18.42(H) 11.72(H)  Sodium 135 - 145 mmol/L 137 133(L) 133(L)  Potassium 3.5 - 5.1 mmol/L 4.1 6.7(HH) 5.0  Chloride 98 - 111 mmol/L 100 93(L) 89(L)  CO2 22 - 32 mmol/L - 22 24  Calcium 8.9 - 10.3 mg/dL - 9.2 9.1  Total Protein 6.5 - 8.1 g/dL - - 7.4  Total Bilirubin 0.3 - 1.2 mg/dL - - 0.5  Alkaline Phos 38 - 126 U/L - - 80  AST 15 - 41 U/L - - 15  ALT 0 - 44 U/L - - 23    Renal function Estimated Creatinine Clearance: 9.9 mL/min (A) (by C-G formula based on SCr of 9.7 mg/dL (H)).  Hemoglobin A1C (%)  Date Value  07/25/2021 9.3 (A)   Hgb A1c MFr Bld (%)  Date Value  12/22/2020 7.3 (H)    Direct LDL  Date Value Ref Range Status  06/25/2019 80.0 mg/dL Final    Comment:    Optimal:  <100 mg/dLNear or Above Optimal:  100-129 mg/dLBorderline High:  130-159 mg/dLHigh:  160-189 mg/dLVery High:  >190 mg/dL     Yevonne Aline. Stanford Breed, MD [facility] Phone Number: [phone] 08/24/2021 7:11 AM  Portions of this report may have been transcribed using voice recognition software.  Every  effort has been made to ensure accuracy; however, inadvertent computerized transcription errors may still be present.

## 2021-08-25 ENCOUNTER — Encounter (HOSPITAL_COMMUNITY): Payer: Self-pay | Admitting: Vascular Surgery

## 2021-08-25 DIAGNOSIS — T8249XA Other complication of vascular dialysis catheter, initial encounter: Secondary | ICD-10-CM | POA: Diagnosis not present

## 2021-08-25 DIAGNOSIS — Z992 Dependence on renal dialysis: Secondary | ICD-10-CM | POA: Diagnosis not present

## 2021-08-25 DIAGNOSIS — N2581 Secondary hyperparathyroidism of renal origin: Secondary | ICD-10-CM | POA: Diagnosis not present

## 2021-08-25 DIAGNOSIS — N186 End stage renal disease: Secondary | ICD-10-CM | POA: Diagnosis not present

## 2021-08-28 DIAGNOSIS — N186 End stage renal disease: Secondary | ICD-10-CM | POA: Diagnosis not present

## 2021-08-28 DIAGNOSIS — Z992 Dependence on renal dialysis: Secondary | ICD-10-CM | POA: Diagnosis not present

## 2021-08-28 DIAGNOSIS — T8249XA Other complication of vascular dialysis catheter, initial encounter: Secondary | ICD-10-CM | POA: Diagnosis not present

## 2021-08-28 DIAGNOSIS — N2581 Secondary hyperparathyroidism of renal origin: Secondary | ICD-10-CM | POA: Diagnosis not present

## 2021-08-30 DIAGNOSIS — Z992 Dependence on renal dialysis: Secondary | ICD-10-CM | POA: Diagnosis not present

## 2021-08-30 DIAGNOSIS — N186 End stage renal disease: Secondary | ICD-10-CM | POA: Diagnosis not present

## 2021-08-30 DIAGNOSIS — N2581 Secondary hyperparathyroidism of renal origin: Secondary | ICD-10-CM | POA: Diagnosis not present

## 2021-08-30 DIAGNOSIS — T8249XA Other complication of vascular dialysis catheter, initial encounter: Secondary | ICD-10-CM | POA: Diagnosis not present

## 2021-09-01 DIAGNOSIS — Z992 Dependence on renal dialysis: Secondary | ICD-10-CM | POA: Diagnosis not present

## 2021-09-01 DIAGNOSIS — T8249XA Other complication of vascular dialysis catheter, initial encounter: Secondary | ICD-10-CM | POA: Diagnosis not present

## 2021-09-01 DIAGNOSIS — N186 End stage renal disease: Secondary | ICD-10-CM | POA: Diagnosis not present

## 2021-09-01 DIAGNOSIS — N2581 Secondary hyperparathyroidism of renal origin: Secondary | ICD-10-CM | POA: Diagnosis not present

## 2021-09-04 DIAGNOSIS — N2581 Secondary hyperparathyroidism of renal origin: Secondary | ICD-10-CM | POA: Diagnosis not present

## 2021-09-04 DIAGNOSIS — Z992 Dependence on renal dialysis: Secondary | ICD-10-CM | POA: Diagnosis not present

## 2021-09-04 DIAGNOSIS — T8249XA Other complication of vascular dialysis catheter, initial encounter: Secondary | ICD-10-CM | POA: Diagnosis not present

## 2021-09-04 DIAGNOSIS — N186 End stage renal disease: Secondary | ICD-10-CM | POA: Diagnosis not present

## 2021-09-06 DIAGNOSIS — N186 End stage renal disease: Secondary | ICD-10-CM | POA: Diagnosis not present

## 2021-09-06 DIAGNOSIS — N2581 Secondary hyperparathyroidism of renal origin: Secondary | ICD-10-CM | POA: Diagnosis not present

## 2021-09-06 DIAGNOSIS — Z992 Dependence on renal dialysis: Secondary | ICD-10-CM | POA: Diagnosis not present

## 2021-09-06 DIAGNOSIS — T8249XA Other complication of vascular dialysis catheter, initial encounter: Secondary | ICD-10-CM | POA: Diagnosis not present

## 2021-09-07 DIAGNOSIS — E103511 Type 1 diabetes mellitus with proliferative diabetic retinopathy with macular edema, right eye: Secondary | ICD-10-CM | POA: Diagnosis not present

## 2021-09-07 DIAGNOSIS — H3582 Retinal ischemia: Secondary | ICD-10-CM | POA: Diagnosis not present

## 2021-09-07 DIAGNOSIS — E103513 Type 1 diabetes mellitus with proliferative diabetic retinopathy with macular edema, bilateral: Secondary | ICD-10-CM | POA: Diagnosis not present

## 2021-09-07 DIAGNOSIS — H31093 Other chorioretinal scars, bilateral: Secondary | ICD-10-CM | POA: Diagnosis not present

## 2021-09-07 DIAGNOSIS — H35033 Hypertensive retinopathy, bilateral: Secondary | ICD-10-CM | POA: Diagnosis not present

## 2021-09-08 DIAGNOSIS — T8249XA Other complication of vascular dialysis catheter, initial encounter: Secondary | ICD-10-CM | POA: Diagnosis not present

## 2021-09-08 DIAGNOSIS — N2581 Secondary hyperparathyroidism of renal origin: Secondary | ICD-10-CM | POA: Diagnosis not present

## 2021-09-08 DIAGNOSIS — N186 End stage renal disease: Secondary | ICD-10-CM | POA: Diagnosis not present

## 2021-09-08 DIAGNOSIS — Z992 Dependence on renal dialysis: Secondary | ICD-10-CM | POA: Diagnosis not present

## 2021-09-11 DIAGNOSIS — Z992 Dependence on renal dialysis: Secondary | ICD-10-CM | POA: Diagnosis not present

## 2021-09-11 DIAGNOSIS — N2581 Secondary hyperparathyroidism of renal origin: Secondary | ICD-10-CM | POA: Diagnosis not present

## 2021-09-11 DIAGNOSIS — N186 End stage renal disease: Secondary | ICD-10-CM | POA: Diagnosis not present

## 2021-09-12 DIAGNOSIS — Z992 Dependence on renal dialysis: Secondary | ICD-10-CM | POA: Diagnosis not present

## 2021-09-12 DIAGNOSIS — E1022 Type 1 diabetes mellitus with diabetic chronic kidney disease: Secondary | ICD-10-CM | POA: Diagnosis not present

## 2021-09-12 DIAGNOSIS — N186 End stage renal disease: Secondary | ICD-10-CM | POA: Diagnosis not present

## 2021-09-12 DIAGNOSIS — N2581 Secondary hyperparathyroidism of renal origin: Secondary | ICD-10-CM | POA: Diagnosis not present

## 2021-09-13 DIAGNOSIS — N2581 Secondary hyperparathyroidism of renal origin: Secondary | ICD-10-CM | POA: Diagnosis not present

## 2021-09-13 DIAGNOSIS — Z992 Dependence on renal dialysis: Secondary | ICD-10-CM | POA: Diagnosis not present

## 2021-09-13 DIAGNOSIS — N186 End stage renal disease: Secondary | ICD-10-CM | POA: Diagnosis not present

## 2021-09-14 DIAGNOSIS — N186 End stage renal disease: Secondary | ICD-10-CM | POA: Diagnosis not present

## 2021-09-14 DIAGNOSIS — Z992 Dependence on renal dialysis: Secondary | ICD-10-CM | POA: Diagnosis not present

## 2021-09-14 DIAGNOSIS — N2581 Secondary hyperparathyroidism of renal origin: Secondary | ICD-10-CM | POA: Diagnosis not present

## 2021-09-15 DIAGNOSIS — Z992 Dependence on renal dialysis: Secondary | ICD-10-CM | POA: Diagnosis not present

## 2021-09-15 DIAGNOSIS — N186 End stage renal disease: Secondary | ICD-10-CM | POA: Diagnosis not present

## 2021-09-15 DIAGNOSIS — N2581 Secondary hyperparathyroidism of renal origin: Secondary | ICD-10-CM | POA: Diagnosis not present

## 2021-09-18 DIAGNOSIS — N2581 Secondary hyperparathyroidism of renal origin: Secondary | ICD-10-CM | POA: Diagnosis not present

## 2021-09-18 DIAGNOSIS — Z992 Dependence on renal dialysis: Secondary | ICD-10-CM | POA: Diagnosis not present

## 2021-09-18 DIAGNOSIS — N186 End stage renal disease: Secondary | ICD-10-CM | POA: Diagnosis not present

## 2021-09-19 DIAGNOSIS — Z992 Dependence on renal dialysis: Secondary | ICD-10-CM | POA: Diagnosis not present

## 2021-09-19 DIAGNOSIS — N2581 Secondary hyperparathyroidism of renal origin: Secondary | ICD-10-CM | POA: Diagnosis not present

## 2021-09-19 DIAGNOSIS — N186 End stage renal disease: Secondary | ICD-10-CM | POA: Diagnosis not present

## 2021-09-20 DIAGNOSIS — Z992 Dependence on renal dialysis: Secondary | ICD-10-CM | POA: Diagnosis not present

## 2021-09-20 DIAGNOSIS — N186 End stage renal disease: Secondary | ICD-10-CM | POA: Diagnosis not present

## 2021-09-20 DIAGNOSIS — N2581 Secondary hyperparathyroidism of renal origin: Secondary | ICD-10-CM | POA: Diagnosis not present

## 2021-09-21 DIAGNOSIS — N186 End stage renal disease: Secondary | ICD-10-CM | POA: Diagnosis not present

## 2021-09-21 DIAGNOSIS — N2581 Secondary hyperparathyroidism of renal origin: Secondary | ICD-10-CM | POA: Diagnosis not present

## 2021-09-21 DIAGNOSIS — Z992 Dependence on renal dialysis: Secondary | ICD-10-CM | POA: Diagnosis not present

## 2021-09-22 DIAGNOSIS — N2581 Secondary hyperparathyroidism of renal origin: Secondary | ICD-10-CM | POA: Diagnosis not present

## 2021-09-22 DIAGNOSIS — N186 End stage renal disease: Secondary | ICD-10-CM | POA: Diagnosis not present

## 2021-09-22 DIAGNOSIS — Z992 Dependence on renal dialysis: Secondary | ICD-10-CM | POA: Diagnosis not present

## 2021-09-23 DIAGNOSIS — N2581 Secondary hyperparathyroidism of renal origin: Secondary | ICD-10-CM | POA: Diagnosis not present

## 2021-09-23 DIAGNOSIS — N186 End stage renal disease: Secondary | ICD-10-CM | POA: Diagnosis not present

## 2021-09-23 DIAGNOSIS — Z992 Dependence on renal dialysis: Secondary | ICD-10-CM | POA: Diagnosis not present

## 2021-09-24 DIAGNOSIS — N186 End stage renal disease: Secondary | ICD-10-CM | POA: Diagnosis not present

## 2021-09-24 DIAGNOSIS — N2581 Secondary hyperparathyroidism of renal origin: Secondary | ICD-10-CM | POA: Diagnosis not present

## 2021-09-24 DIAGNOSIS — Z992 Dependence on renal dialysis: Secondary | ICD-10-CM | POA: Diagnosis not present

## 2021-09-25 DIAGNOSIS — Z992 Dependence on renal dialysis: Secondary | ICD-10-CM | POA: Diagnosis not present

## 2021-09-25 DIAGNOSIS — N186 End stage renal disease: Secondary | ICD-10-CM | POA: Diagnosis not present

## 2021-09-25 DIAGNOSIS — N2581 Secondary hyperparathyroidism of renal origin: Secondary | ICD-10-CM | POA: Diagnosis not present

## 2021-09-26 DIAGNOSIS — N2581 Secondary hyperparathyroidism of renal origin: Secondary | ICD-10-CM | POA: Diagnosis not present

## 2021-09-26 DIAGNOSIS — N186 End stage renal disease: Secondary | ICD-10-CM | POA: Diagnosis not present

## 2021-09-26 DIAGNOSIS — Z992 Dependence on renal dialysis: Secondary | ICD-10-CM | POA: Diagnosis not present

## 2021-09-26 NOTE — Progress Notes (Signed)
09/27/2021 ABSHIR PAOLINI [id number] 1971/03/13   ASSESSMENT AND PLAN:   Gastroesophageal reflux disease, unspecified whether esophagitis present On omprezole BID, feels symptoms controlled, lifestyle discussed Normal esophagus, nonbleeding erosive gastropathy, erythematous duodenopathy-negative H. pylori, negative dysplasia  Nausea without vomiting Can try zofran before dinner Possible from gastroparesis/ozempic- will get gastric emptying study Given information Worse with just smell? Somatic?  Constipation, unspecified constipation type Continue miralax/lactulose Regular bowel regimen may help showed 8 mm adenomatous polyp descending colon semipedunculated, internal hemorrhoids grade 1.  Recall 7 years  Type 2 diabetes mellitus with chronic kidney disease on chronic dialysis, with long-term current use of insulin ([place]) On peritoneal dialysis.   Acute on chronic diastolic heart failure ([place]) Euvolemic   Future Appointments  Date Time Provider Velva  11/28/2021  3:00 PM Shamleffer, Melanie Crazier, MD [facility] None    History of Present Illness:  51 y.o. male  with a past medical history of end-stage renal disease on dialysis, systolic heart failure ejection fraction 45 to 50%, diabetes and others listed below, known to Dr. Fuller Plan returns to clinic today for follow-up of nausea, early satiety, GERD, IBS-C.Marland Kitchen  Patient was last seen by Dr. Fuller Plan 07/11/2021, advised to continue omeprazole 40 mg twice daily, Zofran as needed. On MiraLAX daily, dicyclomine 20 mg twice daily. He is on ozempic x 3 months, he has been having nausea x 1 year.   He continues to have nausea, worse after dinner.   No vomiting, no fever, chills.  He feels his GERD is well controlled at this time.  States can have nausea with AB discomfort even just with the smell of the food, will take a few bites and feel very full.  No nausea with breakfast or lunch, can get full but not easily.   He can feel AB bloating  He states if he eats fruit, he will feel better.  Denies weight loss.  No vertigo.   HE has been doing parataenial dialysis at home, has been slower or having to sit on side of bed to have dialysis function better. He was started on lactulose from his nephrologist for 1 week. This has been helping him have BM's but did not help with his dialysis. Will still need to get this evaluated, planning on getting CT.   External labs and notes reviewed this visit: Colonoscopy 12/04/2018  showed 8 mm adenomatous polyp descending colon semipedunculated, internal hemorrhoids grade 1.  Recall 7 years Endoscopy 10/29/2018 Normal esophagus, nonbleeding erosive gastropathy, erythematous duodenopathy-negative H. pylori, negative dysplasia  08/24/2021 HGB 15.0 MCV 82.7 without evidence of anemia 06/25/2019 Iron 59 Ferritin 1,376  06/25/2019  B12 >150 WBC 11.3 Platelets 267 08/24/2021 BUN 41 Cr 9.70 on HD Potassium 4.1   12/25/2020 AST 15 ALT 23 Alkphos 80 TBili 0.5 06/25/2019 TSH 2.03   Current Medications:   Current Outpatient Medications (Endocrine & Metabolic):    calcitRIOL (ROCALTROL) 0.25 MCG capsule, Take 1 capsule (0.25 mcg total) by mouth every Monday, Wednesday, and Friday at 6 PM.   cinacalcet (SENSIPAR) 30 MG tablet, Take 60 mg by mouth every evening.   insulin aspart (NOVOLOG) 100 UNIT/ML injection, INJECT 8 TO 12 UNITS INTO THE SKIN THREE TIMES DAILY WITH MEALS (Patient taking differently: Inject 8-12 Units into the skin 3 (three) times daily with meals.)   insulin glargine (LANTUS) 100 UNIT/ML injection, Inject 0.42 mLs (42 Units total) into the skin at bedtime.   Semaglutide, 1 MG/DOSE, (OZEMPIC, 1 MG/DOSE,) 4 MG/3ML SOPN, Inject 1  mg into the skin once a week. (Patient taking differently: Inject 1 mg into the skin every Sunday.)  Current Outpatient Medications (Cardiovascular):    amLODipine (NORVASC) 5 MG tablet, Take 5 mg by mouth at bedtime.   atorvastatin  (LIPITOR) 80 MG tablet, Take 80 mg by mouth daily.   losartan (COZAAR) 50 MG tablet, Take 1 tablet by mouth daily.   metoprolol succinate (TOPROL-XL) 25 MG 24 hr tablet, Take 25 mg by mouth daily.   NORVASC 10 MG tablet, Take 10 mg by mouth at bedtime.   rosuvastatin (CRESTOR) 10 MG tablet, Take 1 tablet (10 mg total) by mouth daily.  Current Outpatient Medications (Respiratory):    albuterol (VENTOLIN HFA) 108 (90 Base) MCG/ACT inhaler, Inhale 2 puffs into the lungs every 6 (six) hours as needed for wheezing or shortness of breath.   cetirizine (ZYRTEC) 10 MG tablet, Take 1 tablet by mouth once daily   triamcinolone (NASACORT) 55 MCG/ACT AERO nasal inhaler, Place 2 sprays into the nose daily. (Patient taking differently: Place 2 sprays into the nose daily as needed (allergies).)  Current Outpatient Medications (Analgesics):    aspirin 81 MG EC tablet, Take by mouth.   HYDROcodone-acetaminophen (NORCO) 5-325 MG tablet, Take 1 tablet by mouth every 6 (six) hours as needed for moderate pain.   Current Outpatient Medications (Other):    AURYXIA 1 GM 210 MG(Fe) tablet, Take 420 mg by mouth 3 (three) times daily.   Continuous Blood Gluc Sensor (FREESTYLE LIBRE 14 DAY SENSOR) MISC, 1 each by Other route daily.   dicyclomine (BENTYL) 20 MG tablet, Take 1 tablet (20 mg total) by mouth in the morning and at bedtime.   gabapentin (NEURONTIN) 300 MG capsule, TAKE 1 TO 2 CAPSULES BY MOUTH AT BEDTIME AS NEEDED   levETIRAcetam (KEPPRA) 250 MG tablet, Take 1 tablet (250 mg total) by mouth at bedtime.   meclizine (ANTIVERT) 12.5 MG tablet, TAKE 1 TABLET BY MOUTH THREE TIMES DAILY AS NEEDED FOR DIZZINESS (Patient taking differently: Take 12.5 mg by mouth 3 (three) times daily as needed for dizziness.)   multivitamin (RENA-VIT) TABS tablet, Take 1 tablet by mouth daily.   Nutritional Supplements (VITAMIN D BOOSTER PO), Take 1 mg by mouth 3 (three) times a week.   omeprazole (PRILOSEC) 40 MG capsule, Take 1  capsule (40 mg total) by mouth 2 (two) times daily.   ondansetron (ZOFRAN) 4 MG tablet, Take 1 tablet (4 mg total) by mouth every 8 (eight) hours as needed for nausea or vomiting.   polyethylene glycol powder (GLYCOLAX/MIRALAX) 17 GM/SCOOP powder, Take 1 Container by mouth daily as needed for mild constipation.   rOPINIRole (REQUIP) 1 MG tablet, Take 1 mg by mouth at bedtime.  Surgical History:  He  has a past surgical history that includes Mouth surgery; IR US Guide Vasc Access Right (10/22/2017); IR Fluoro Guide CV Line Right (10/22/2017); Colonoscopy; AV fistula placement (Left, 06/04/2019); IR THROMBECTOMY AV FISTULA W/THROMBOLYSIS/PTA/STENT INC SHUNT IMG LT (Left, 03/2021); and CAPD insertion (N/A, 08/24/2021). Family History:  His family history includes Colon cancer in his maternal uncle; Colon polyps in his maternal uncle; Diabetes in his maternal aunt and mother; Heart disease in his maternal uncle; Hypertension in his mother; Lung cancer in his maternal grandfather. Social History:   reports that he quit smoking about 6 years ago. His smoking use included cigarettes. He has never used smokeless tobacco. He reports that he does not currently use alcohol. He reports that he does not use drugs.  Current Medications, Allergies, Past Medical History, Past Surgical History, Family History and Social History were reviewed in [employer] record.  Physical Exam: BP (!) 152/90    Pulse 72    Ht 5\' 6"  (1.676 m)    Wt 215 lb 12.8 oz (97.9 kg)    BMI 34.83 kg/m  General:   Pleasant, well developed male in no acute distress Eyes: sclerae anicteric,conjunctive pink  Heart:  regular rate and rhythm Pulm: Clear anteriorly; no wheezing Abdomen:  Soft, Obese AB, skin exam has dexcom on right AB, peritoneal cath left AB, Normal bowel sounds.  no  tenderness . Without guarding and Without rebound, without hepatomegaly. Extremities:  Without edema. Peripheral pulses intact.   Neurologic:  Alert and  oriented x4;  grossly normal neurologically. Skin:   Dry and intact without significant lesions or rashes. Psychiatric: Demonstrates good judgement and reason without abnormal affect or behaviors.  Vladimir Crofts, PA-C 09/27/21

## 2021-09-27 ENCOUNTER — Encounter: Payer: Self-pay | Admitting: Physician Assistant

## 2021-09-27 ENCOUNTER — Ambulatory Visit (INDEPENDENT_AMBULATORY_CARE_PROVIDER_SITE_OTHER): Payer: Federal, State, Local not specified - PPO | Admitting: Physician Assistant

## 2021-09-27 ENCOUNTER — Other Ambulatory Visit: Payer: Self-pay | Admitting: Nephrology

## 2021-09-27 VITALS — BP 152/90 | HR 72 | Ht 66.0 in | Wt 215.8 lb

## 2021-09-27 DIAGNOSIS — Z794 Long term (current) use of insulin: Secondary | ICD-10-CM

## 2021-09-27 DIAGNOSIS — Z992 Dependence on renal dialysis: Secondary | ICD-10-CM | POA: Diagnosis not present

## 2021-09-27 DIAGNOSIS — K219 Gastro-esophageal reflux disease without esophagitis: Secondary | ICD-10-CM | POA: Diagnosis not present

## 2021-09-27 DIAGNOSIS — R110 Nausea: Secondary | ICD-10-CM

## 2021-09-27 DIAGNOSIS — N186 End stage renal disease: Secondary | ICD-10-CM

## 2021-09-27 DIAGNOSIS — I5033 Acute on chronic diastolic (congestive) heart failure: Secondary | ICD-10-CM

## 2021-09-27 DIAGNOSIS — E1122 Type 2 diabetes mellitus with diabetic chronic kidney disease: Secondary | ICD-10-CM | POA: Diagnosis not present

## 2021-09-27 DIAGNOSIS — K5900 Constipation, unspecified: Secondary | ICD-10-CM

## 2021-09-27 DIAGNOSIS — N2581 Secondary hyperparathyroidism of renal origin: Secondary | ICD-10-CM | POA: Diagnosis not present

## 2021-09-27 NOTE — Patient Instructions (Addendum)
If you are age 49 or younger, your body mass index should be between 19-25. Your Body mass index is 34.83 kg/m. If this is out of the aformentioned range listed, please consider follow up with your Primary Care Provider.  ________________________________________________________  The  GI providers would like to encourage you to use [facility] to communicate with providers for non-urgent requests or questions.  Due to long hold times on the telephone, sending your provider a message by [facility] may be a faster and more efficient way to get a response.  Please allow 48 business hours for a response.  Please remember that this is for non-urgent requests.  _______________________________________________________  Hector Mahoney have been scheduled for a gastric emptying scan at [facility] on 10/06/2021 at 11:00 am. Please arrive at least 15 minutes prior to your appointment for registration. Please make certain not to have anything to eat or drink after midnight the night before your test. Hold all stomach medications (ex: Zofran, phenergan, Reglan) 48 hours prior to your test. If you need to reschedule your appointment, please contact radiology scheduling at [phone]. _____________________________________________________________________ A gastric-emptying study measures how long it takes for food to move through your stomach. There are several ways to measure stomach emptying. In the most common test, you eat food that contains a small amount of radioactive material. A scanner that detects the movement of the radioactive material is placed over your abdomen to monitor the rate at which food leaves your stomach. This test normally takes about 4 hours to complete. _____________________________________________________________________  Continue current medications.  Follow up pending the results of your Gastric Emptying Scan.  Thank you for entrusting me with your care and choosing [facility].  Hector Mutters, PA-C  Gastroparesis Please do small frequent meals like 4-6 meals a day.  Eat and drink liquids at separate times.  Avoid high fiber foods, cook your vegetables, avoid high fat food.  Suggest spreading protein throughout the day (greek yogurt, glucerna, soft meat, milk, eggs) Choose soft foods that you can mash with a fork When you are more symptomatic, change to pureed foods foods and liquids.  Consider reading "Living well with Gastroparesis" by Lambert Keto Gastroparesis is a condition in which food takes longer than normal to empty from the stomach. This condition is also known as delayed gastric emptying. It is usually a long-term (chronic) condition. There is no cure, but there are treatments and things that you can do at home to help relieve symptoms. Treating the underlying condition that causes gastroparesis can also help relieve symptoms What are the causes? In many cases, the cause of this condition is not known. Possible causes include: A hormone (endocrine) disorder, such as hypothyroidism or diabetes. A nervous system disease, such as Parkinson's disease or multiple sclerosis. Cancer, infection, or surgery that affects the stomach or vagus nerve. The vagus nerve runs from your chest, through your neck, and to the lower part of your brain. A connective tissue disorder, such as scleroderma. Certain medicines. What increases the risk? You are more likely to develop this condition if: You have certain disorders or diseases. These may include: An endocrine disorder. An eating disorder. Amyloidosis. Scleroderma. Parkinson's disease. Multiple sclerosis. Cancer or infection of the stomach or the vagus nerve. You have had surgery on your stomach or vagus nerve. You take certain medicines. You are male. What are the signs or symptoms? Symptoms of this condition include: Feeling full after eating very little or a loss of appetite. Nausea,  vomiting, or heartburn. Bloating of your abdomen. Inconsistent blood sugar (glucose) levels on blood tests. Unexplained weight loss. Acid from the stomach coming up into the esophagus (gastroesophageal reflux). Sudden tightening (spasm) of the stomach, which can be painful. Symptoms may come and go. Some people may not notice any symptoms. How is this diagnosed? This condition is diagnosed with tests, such as: Tests that check how long it takes food to move through the stomach and intestines. These tests include: Upper gastrointestinal (GI) series. For this test, you drink a liquid that shows up well on X-rays, and then X-rays are taken of your intestines. Gastric emptying scintigraphy. For this test, you eat food that contains a small amount of radioactive material, and then scans are taken. Wireless capsule GI monitoring system. For this test, you swallow a pill (capsule) that records information about how foods and fluid move through your stomach. Gastric manometry. For this test, a tube is passed down your throat and into your stomach to measure electrical and muscular activity. Endoscopy. For this test, a long, thin tube with a camera and light on the end is passed down your throat and into your stomach to check for problems in your stomach lining. Ultrasound. This test uses sound waves to create images of the inside of your body. This can help rule out gallbladder disease or pancreatitis as a cause of your symptoms. How is this treated? There is no cure for this condition, but treatment and home care may relieve symptoms. Treatment may include: Treating the underlying cause. Managing your symptoms by making changes to your diet and exercise habits. Taking medicines to control nausea and vomiting and to stimulate stomach muscles. Getting food through a feeding tube in the hospital. This may be done in severe cases. Having surgery to insert a device called a gastric electrical stimulator  into your body. This device helps improve stomach emptying and control nausea and vomiting. Follow these instructions at home: Take over-the-counter and prescription medicines only as told by your health care provider. Follow instructions from your health care provider about eating or drinking restrictions. Your health care provider may recommend that you: Eat smaller meals more often. Eat low-fat foods. Eat low-fiber forms of high-fiber foods. For example, eat cooked vegetables instead of raw vegetables. Have only liquid foods instead of solid foods. Liquid foods are easier to digest. Drink enough fluid to keep your urine pale yellow. Exercise as often as told by your health care provider. Keep all follow-up visits. This is important. Contact a health care provider if you: Notice that your symptoms do not improve with treatment. Have new symptoms. Get help right away if you: Have severe pain in your abdomen that does not improve with treatment. Have nausea that is severe or does not go away. Vomit every time you drink fluids. Summary Gastroparesis is a long-term (chronic) condition in which food takes longer than normal to empty from the stomach. Symptoms include nausea, vomiting, heartburn, bloating of your abdomen, and loss of appetite. Eating smaller portions, low-fat foods, and low-fiber forms of high-fiber foods may help you manage your symptoms. Get help right away if you have severe pain in your abdomen. This information is not intended to replace advice given to you by your health care provider. Make sure you discuss any questions you have with your health care provider. Document Revised: 12/07/2019 Document Reviewed: 12/07/2019 Elsevier Patient Education  2021 [employer].

## 2021-09-28 DIAGNOSIS — N186 End stage renal disease: Secondary | ICD-10-CM | POA: Diagnosis not present

## 2021-09-28 DIAGNOSIS — N2581 Secondary hyperparathyroidism of renal origin: Secondary | ICD-10-CM | POA: Diagnosis not present

## 2021-09-28 DIAGNOSIS — Z992 Dependence on renal dialysis: Secondary | ICD-10-CM | POA: Diagnosis not present

## 2021-09-29 DIAGNOSIS — N186 End stage renal disease: Secondary | ICD-10-CM | POA: Diagnosis not present

## 2021-09-29 DIAGNOSIS — Z992 Dependence on renal dialysis: Secondary | ICD-10-CM | POA: Diagnosis not present

## 2021-09-29 DIAGNOSIS — N2581 Secondary hyperparathyroidism of renal origin: Secondary | ICD-10-CM | POA: Diagnosis not present

## 2021-09-30 DIAGNOSIS — Z992 Dependence on renal dialysis: Secondary | ICD-10-CM | POA: Diagnosis not present

## 2021-09-30 DIAGNOSIS — N2581 Secondary hyperparathyroidism of renal origin: Secondary | ICD-10-CM | POA: Diagnosis not present

## 2021-09-30 DIAGNOSIS — N186 End stage renal disease: Secondary | ICD-10-CM | POA: Diagnosis not present

## 2021-10-01 DIAGNOSIS — Z992 Dependence on renal dialysis: Secondary | ICD-10-CM | POA: Diagnosis not present

## 2021-10-01 DIAGNOSIS — N186 End stage renal disease: Secondary | ICD-10-CM | POA: Diagnosis not present

## 2021-10-01 DIAGNOSIS — N2581 Secondary hyperparathyroidism of renal origin: Secondary | ICD-10-CM | POA: Diagnosis not present

## 2021-10-02 DIAGNOSIS — N2581 Secondary hyperparathyroidism of renal origin: Secondary | ICD-10-CM | POA: Diagnosis not present

## 2021-10-02 DIAGNOSIS — N186 End stage renal disease: Secondary | ICD-10-CM | POA: Diagnosis not present

## 2021-10-02 DIAGNOSIS — Z992 Dependence on renal dialysis: Secondary | ICD-10-CM | POA: Diagnosis not present

## 2021-10-03 DIAGNOSIS — N186 End stage renal disease: Secondary | ICD-10-CM | POA: Diagnosis not present

## 2021-10-03 DIAGNOSIS — N2581 Secondary hyperparathyroidism of renal origin: Secondary | ICD-10-CM | POA: Diagnosis not present

## 2021-10-03 DIAGNOSIS — Z992 Dependence on renal dialysis: Secondary | ICD-10-CM | POA: Diagnosis not present

## 2021-10-04 ENCOUNTER — Other Ambulatory Visit: Payer: Self-pay

## 2021-10-04 ENCOUNTER — Ambulatory Visit (INDEPENDENT_AMBULATORY_CARE_PROVIDER_SITE_OTHER): Payer: Federal, State, Local not specified - PPO | Admitting: Podiatry

## 2021-10-04 DIAGNOSIS — N2581 Secondary hyperparathyroidism of renal origin: Secondary | ICD-10-CM | POA: Diagnosis not present

## 2021-10-04 DIAGNOSIS — L989 Disorder of the skin and subcutaneous tissue, unspecified: Secondary | ICD-10-CM

## 2021-10-04 DIAGNOSIS — N186 End stage renal disease: Secondary | ICD-10-CM | POA: Diagnosis not present

## 2021-10-04 DIAGNOSIS — E119 Type 2 diabetes mellitus without complications: Secondary | ICD-10-CM | POA: Diagnosis not present

## 2021-10-04 DIAGNOSIS — Z992 Dependence on renal dialysis: Secondary | ICD-10-CM | POA: Diagnosis not present

## 2021-10-04 NOTE — Progress Notes (Signed)
° °  HPI: 51 y.o. male presenting today as a new patient for evaluation of a diabetic foot exam.  Patient states that he has been diagnosed with diabetes about 25 years ago.  His last A1c was 9.3.  He says that traditionally it has been more controlled.  He has been developing some numbness and tingling to the bilateral feet.  He presents for further treatment and evaluation  Past Medical History:  Diagnosis Date   Anemia    CHF (congestive heart failure) (HCC)    Esophageal reflux 04/27/2009   ESRD (end stage renal disease) on dialysis [facility])    home HD 4x/week   HYPERLIPIDEMIA 04/24/2007   HYPERTENSION 04/24/2007   Insulin dependent type 1 diabetes mellitus ([place])     Past Surgical History:  Procedure Laterality Date   AV FISTULA PLACEMENT Left 06/04/2019   Procedure: BRACHIAL-CEPHALIC ARTERIOVENOUS (AV) FISTULA CREATION LEFT ARM;  Surgeon: Rosetta Posner, MD;  Location: [facility] OR;  Service: Vascular;  Laterality: Left;   CAPD INSERTION N/A 08/24/2021   Procedure: LAPAROSCOPIC INSERTION CONTINUOUS AMBULATORY PERITONEAL DIALYSIS  (CAPD) CATHETER;  Surgeon: Cherre Robins, MD;  Location: [place];  Service: Vascular;  Laterality: N/A;   COLONOSCOPY     polyp   IR FLUORO GUIDE CV LINE RIGHT  10/22/2017   IR THROMBECTOMY AV FISTULA W/THROMBOLYSIS/PTA/STENT INC/SHUNT/IMG LT Left 03/2021   IR US GUIDE VASC ACCESS RIGHT  10/22/2017   MOUTH SURGERY     tooth ext    No Known Allergies   Physical Exam: General: The patient is alert and oriented x3 in no acute distress.  Dermatology: Skin is warm, dry and supple bilateral lower extremities. Negative for open lesions or macerations.  There is some hyperkeratotic preulcerative callus tissue noted to the plantar aspect of the right great toe joint  Vascular: Palpable pedal pulses bilaterally. Capillary refill within normal limits.  Negative for any significant edema or erythema  Neurological: Light touch and protective threshold  diminished  Musculoskeletal Exam: No pedal deformities noted  Assessment: 1.  Diabetes mellitus with peripheral polyneuropathy   Plan of Care:  1. Patient evaluated. 2.  Comprehensive diabetic foot exam was performed today 3.  Excisional debridement of the hyperkeratotic preulcerative callus tissue was performed to the plantar aspect of the first MTP joint right 4.  Appointment with Pedorthist for diabetic shoes and insoles 5.  Return to clinic annually      Edrick Kins, DPM [facility]  Dr. Edrick Kins, DPM    [address], [place] [address]                Office [phone]  Fax [phone]

## 2021-10-05 DIAGNOSIS — N2581 Secondary hyperparathyroidism of renal origin: Secondary | ICD-10-CM | POA: Diagnosis not present

## 2021-10-05 DIAGNOSIS — Z992 Dependence on renal dialysis: Secondary | ICD-10-CM | POA: Diagnosis not present

## 2021-10-05 DIAGNOSIS — N186 End stage renal disease: Secondary | ICD-10-CM | POA: Diagnosis not present

## 2021-10-06 ENCOUNTER — Ambulatory Visit
Admission: RE | Admit: 2021-10-06 | Discharge: 2021-10-06 | Disposition: A | Discharge status: Home / Self Care | Payer: Federal, State, Local not specified - PPO | Source: Ambulatory Visit | Attending: Nephrology | Admitting: Nephrology

## 2021-10-06 ENCOUNTER — Other Ambulatory Visit: Payer: Self-pay

## 2021-10-06 ENCOUNTER — Ambulatory Visit (HOSPITAL_COMMUNITY): Payer: Federal, State, Local not specified - PPO | Attending: Physician Assistant

## 2021-10-06 DIAGNOSIS — R188 Other ascites: Secondary | ICD-10-CM | POA: Diagnosis not present

## 2021-10-06 DIAGNOSIS — Z992 Dependence on renal dialysis: Secondary | ICD-10-CM | POA: Diagnosis not present

## 2021-10-06 DIAGNOSIS — N186 End stage renal disease: Secondary | ICD-10-CM | POA: Diagnosis not present

## 2021-10-06 DIAGNOSIS — I700 Atherosclerosis of aorta: Secondary | ICD-10-CM | POA: Diagnosis not present

## 2021-10-06 DIAGNOSIS — N2889 Other specified disorders of kidney and ureter: Secondary | ICD-10-CM | POA: Diagnosis not present

## 2021-10-06 DIAGNOSIS — N2581 Secondary hyperparathyroidism of renal origin: Secondary | ICD-10-CM | POA: Diagnosis not present

## 2021-10-07 DIAGNOSIS — N2581 Secondary hyperparathyroidism of renal origin: Secondary | ICD-10-CM | POA: Diagnosis not present

## 2021-10-07 DIAGNOSIS — Z992 Dependence on renal dialysis: Secondary | ICD-10-CM | POA: Diagnosis not present

## 2021-10-07 DIAGNOSIS — N186 End stage renal disease: Secondary | ICD-10-CM | POA: Diagnosis not present

## 2021-10-08 DIAGNOSIS — N186 End stage renal disease: Secondary | ICD-10-CM | POA: Diagnosis not present

## 2021-10-08 DIAGNOSIS — Z992 Dependence on renal dialysis: Secondary | ICD-10-CM | POA: Diagnosis not present

## 2021-10-08 DIAGNOSIS — N2581 Secondary hyperparathyroidism of renal origin: Secondary | ICD-10-CM | POA: Diagnosis not present

## 2021-10-09 ENCOUNTER — Ambulatory Visit: Payer: Federal, State, Local not specified - PPO | Admitting: Internal Medicine

## 2021-10-09 DIAGNOSIS — Z992 Dependence on renal dialysis: Secondary | ICD-10-CM | POA: Diagnosis not present

## 2021-10-09 DIAGNOSIS — N2581 Secondary hyperparathyroidism of renal origin: Secondary | ICD-10-CM | POA: Diagnosis not present

## 2021-10-09 DIAGNOSIS — N186 End stage renal disease: Secondary | ICD-10-CM | POA: Diagnosis not present

## 2021-10-10 DIAGNOSIS — N2581 Secondary hyperparathyroidism of renal origin: Secondary | ICD-10-CM | POA: Diagnosis not present

## 2021-10-10 DIAGNOSIS — N186 End stage renal disease: Secondary | ICD-10-CM | POA: Diagnosis not present

## 2021-10-10 DIAGNOSIS — E1022 Type 1 diabetes mellitus with diabetic chronic kidney disease: Secondary | ICD-10-CM | POA: Diagnosis not present

## 2021-10-10 DIAGNOSIS — Z992 Dependence on renal dialysis: Secondary | ICD-10-CM | POA: Diagnosis not present

## 2021-10-11 DIAGNOSIS — N2581 Secondary hyperparathyroidism of renal origin: Secondary | ICD-10-CM | POA: Diagnosis not present

## 2021-10-11 DIAGNOSIS — N186 End stage renal disease: Secondary | ICD-10-CM | POA: Diagnosis not present

## 2021-10-11 DIAGNOSIS — Z992 Dependence on renal dialysis: Secondary | ICD-10-CM | POA: Diagnosis not present

## 2021-10-12 DIAGNOSIS — N186 End stage renal disease: Secondary | ICD-10-CM | POA: Diagnosis not present

## 2021-10-12 DIAGNOSIS — E103511 Type 1 diabetes mellitus with proliferative diabetic retinopathy with macular edema, right eye: Secondary | ICD-10-CM | POA: Diagnosis not present

## 2021-10-12 DIAGNOSIS — N2581 Secondary hyperparathyroidism of renal origin: Secondary | ICD-10-CM | POA: Diagnosis not present

## 2021-10-12 DIAGNOSIS — Z992 Dependence on renal dialysis: Secondary | ICD-10-CM | POA: Diagnosis not present

## 2021-10-12 DIAGNOSIS — E103513 Type 1 diabetes mellitus with proliferative diabetic retinopathy with macular edema, bilateral: Secondary | ICD-10-CM | POA: Diagnosis not present

## 2021-10-13 ENCOUNTER — Ambulatory Visit: Payer: Federal, State, Local not specified - PPO | Admitting: Nurse Practitioner

## 2021-10-13 DIAGNOSIS — Z992 Dependence on renal dialysis: Secondary | ICD-10-CM | POA: Diagnosis not present

## 2021-10-13 DIAGNOSIS — N2581 Secondary hyperparathyroidism of renal origin: Secondary | ICD-10-CM | POA: Diagnosis not present

## 2021-10-13 DIAGNOSIS — N186 End stage renal disease: Secondary | ICD-10-CM | POA: Diagnosis not present

## 2021-10-14 DIAGNOSIS — N186 End stage renal disease: Secondary | ICD-10-CM | POA: Diagnosis not present

## 2021-10-14 DIAGNOSIS — N2581 Secondary hyperparathyroidism of renal origin: Secondary | ICD-10-CM | POA: Diagnosis not present

## 2021-10-14 DIAGNOSIS — Z992 Dependence on renal dialysis: Secondary | ICD-10-CM | POA: Diagnosis not present

## 2021-10-15 DIAGNOSIS — N2581 Secondary hyperparathyroidism of renal origin: Secondary | ICD-10-CM | POA: Diagnosis not present

## 2021-10-15 DIAGNOSIS — N186 End stage renal disease: Secondary | ICD-10-CM | POA: Diagnosis not present

## 2021-10-15 DIAGNOSIS — Z992 Dependence on renal dialysis: Secondary | ICD-10-CM | POA: Diagnosis not present

## 2021-10-16 ENCOUNTER — Other Ambulatory Visit: Payer: Federal, State, Local not specified - PPO

## 2021-10-16 DIAGNOSIS — Z992 Dependence on renal dialysis: Secondary | ICD-10-CM | POA: Diagnosis not present

## 2021-10-16 DIAGNOSIS — Z0189 Encounter for other specified special examinations: Secondary | ICD-10-CM | POA: Diagnosis not present

## 2021-10-16 DIAGNOSIS — N186 End stage renal disease: Secondary | ICD-10-CM | POA: Diagnosis not present

## 2021-10-16 DIAGNOSIS — N2581 Secondary hyperparathyroidism of renal origin: Secondary | ICD-10-CM | POA: Diagnosis not present

## 2021-10-17 ENCOUNTER — Other Ambulatory Visit: Payer: Federal, State, Local not specified - PPO

## 2021-10-17 DIAGNOSIS — Z992 Dependence on renal dialysis: Secondary | ICD-10-CM | POA: Diagnosis not present

## 2021-10-17 DIAGNOSIS — N186 End stage renal disease: Secondary | ICD-10-CM | POA: Diagnosis not present

## 2021-10-17 DIAGNOSIS — N2581 Secondary hyperparathyroidism of renal origin: Secondary | ICD-10-CM | POA: Diagnosis not present

## 2021-10-18 DIAGNOSIS — N2581 Secondary hyperparathyroidism of renal origin: Secondary | ICD-10-CM | POA: Diagnosis not present

## 2021-10-18 DIAGNOSIS — N186 End stage renal disease: Secondary | ICD-10-CM | POA: Diagnosis not present

## 2021-10-18 DIAGNOSIS — Z992 Dependence on renal dialysis: Secondary | ICD-10-CM | POA: Diagnosis not present

## 2021-10-19 DIAGNOSIS — N186 End stage renal disease: Secondary | ICD-10-CM | POA: Diagnosis not present

## 2021-10-19 DIAGNOSIS — Z992 Dependence on renal dialysis: Secondary | ICD-10-CM | POA: Diagnosis not present

## 2021-10-19 DIAGNOSIS — N2581 Secondary hyperparathyroidism of renal origin: Secondary | ICD-10-CM | POA: Diagnosis not present

## 2021-10-20 DIAGNOSIS — N186 End stage renal disease: Secondary | ICD-10-CM | POA: Diagnosis not present

## 2021-10-20 DIAGNOSIS — Z992 Dependence on renal dialysis: Secondary | ICD-10-CM | POA: Diagnosis not present

## 2021-10-20 DIAGNOSIS — N2581 Secondary hyperparathyroidism of renal origin: Secondary | ICD-10-CM | POA: Diagnosis not present

## 2021-10-21 DIAGNOSIS — N2581 Secondary hyperparathyroidism of renal origin: Secondary | ICD-10-CM | POA: Diagnosis not present

## 2021-10-21 DIAGNOSIS — Z992 Dependence on renal dialysis: Secondary | ICD-10-CM | POA: Diagnosis not present

## 2021-10-21 DIAGNOSIS — N186 End stage renal disease: Secondary | ICD-10-CM | POA: Diagnosis not present

## 2021-10-22 DIAGNOSIS — N186 End stage renal disease: Secondary | ICD-10-CM | POA: Diagnosis not present

## 2021-10-22 DIAGNOSIS — N2581 Secondary hyperparathyroidism of renal origin: Secondary | ICD-10-CM | POA: Diagnosis not present

## 2021-10-22 DIAGNOSIS — Z992 Dependence on renal dialysis: Secondary | ICD-10-CM | POA: Diagnosis not present

## 2021-10-23 DIAGNOSIS — Z992 Dependence on renal dialysis: Secondary | ICD-10-CM | POA: Diagnosis not present

## 2021-10-23 DIAGNOSIS — N186 End stage renal disease: Secondary | ICD-10-CM | POA: Diagnosis not present

## 2021-10-23 DIAGNOSIS — N2581 Secondary hyperparathyroidism of renal origin: Secondary | ICD-10-CM | POA: Diagnosis not present

## 2021-10-24 DIAGNOSIS — Z992 Dependence on renal dialysis: Secondary | ICD-10-CM | POA: Diagnosis not present

## 2021-10-24 DIAGNOSIS — N2581 Secondary hyperparathyroidism of renal origin: Secondary | ICD-10-CM | POA: Diagnosis not present

## 2021-10-24 DIAGNOSIS — N186 End stage renal disease: Secondary | ICD-10-CM | POA: Diagnosis not present

## 2021-10-25 DIAGNOSIS — Z992 Dependence on renal dialysis: Secondary | ICD-10-CM | POA: Diagnosis not present

## 2021-10-25 DIAGNOSIS — N2581 Secondary hyperparathyroidism of renal origin: Secondary | ICD-10-CM | POA: Diagnosis not present

## 2021-10-25 DIAGNOSIS — N186 End stage renal disease: Secondary | ICD-10-CM | POA: Diagnosis not present

## 2021-10-26 DIAGNOSIS — N2581 Secondary hyperparathyroidism of renal origin: Secondary | ICD-10-CM | POA: Diagnosis not present

## 2021-10-26 DIAGNOSIS — N186 End stage renal disease: Secondary | ICD-10-CM | POA: Diagnosis not present

## 2021-10-26 DIAGNOSIS — Z992 Dependence on renal dialysis: Secondary | ICD-10-CM | POA: Diagnosis not present

## 2021-10-27 DIAGNOSIS — Z992 Dependence on renal dialysis: Secondary | ICD-10-CM | POA: Diagnosis not present

## 2021-10-27 DIAGNOSIS — N2581 Secondary hyperparathyroidism of renal origin: Secondary | ICD-10-CM | POA: Diagnosis not present

## 2021-10-27 DIAGNOSIS — N186 End stage renal disease: Secondary | ICD-10-CM | POA: Diagnosis not present

## 2021-10-28 DIAGNOSIS — Z992 Dependence on renal dialysis: Secondary | ICD-10-CM | POA: Diagnosis not present

## 2021-10-28 DIAGNOSIS — N186 End stage renal disease: Secondary | ICD-10-CM | POA: Diagnosis not present

## 2021-10-28 DIAGNOSIS — N2581 Secondary hyperparathyroidism of renal origin: Secondary | ICD-10-CM | POA: Diagnosis not present

## 2021-10-29 DIAGNOSIS — N2581 Secondary hyperparathyroidism of renal origin: Secondary | ICD-10-CM | POA: Diagnosis not present

## 2021-10-29 DIAGNOSIS — N186 End stage renal disease: Secondary | ICD-10-CM | POA: Diagnosis not present

## 2021-10-29 DIAGNOSIS — Z992 Dependence on renal dialysis: Secondary | ICD-10-CM | POA: Diagnosis not present

## 2021-10-30 DIAGNOSIS — N186 End stage renal disease: Secondary | ICD-10-CM | POA: Diagnosis not present

## 2021-10-30 DIAGNOSIS — Z992 Dependence on renal dialysis: Secondary | ICD-10-CM | POA: Diagnosis not present

## 2021-10-30 DIAGNOSIS — N2581 Secondary hyperparathyroidism of renal origin: Secondary | ICD-10-CM | POA: Diagnosis not present

## 2021-10-31 DIAGNOSIS — Z992 Dependence on renal dialysis: Secondary | ICD-10-CM | POA: Diagnosis not present

## 2021-10-31 DIAGNOSIS — N186 End stage renal disease: Secondary | ICD-10-CM | POA: Diagnosis not present

## 2021-10-31 DIAGNOSIS — N2581 Secondary hyperparathyroidism of renal origin: Secondary | ICD-10-CM | POA: Diagnosis not present

## 2021-11-01 DIAGNOSIS — N2581 Secondary hyperparathyroidism of renal origin: Secondary | ICD-10-CM | POA: Diagnosis not present

## 2021-11-01 DIAGNOSIS — N186 End stage renal disease: Secondary | ICD-10-CM | POA: Diagnosis not present

## 2021-11-01 DIAGNOSIS — Z992 Dependence on renal dialysis: Secondary | ICD-10-CM | POA: Diagnosis not present

## 2021-11-02 DIAGNOSIS — N186 End stage renal disease: Secondary | ICD-10-CM | POA: Diagnosis not present

## 2021-11-02 DIAGNOSIS — N2581 Secondary hyperparathyroidism of renal origin: Secondary | ICD-10-CM | POA: Diagnosis not present

## 2021-11-02 DIAGNOSIS — Z992 Dependence on renal dialysis: Secondary | ICD-10-CM | POA: Diagnosis not present

## 2021-11-03 DIAGNOSIS — Z992 Dependence on renal dialysis: Secondary | ICD-10-CM | POA: Diagnosis not present

## 2021-11-03 DIAGNOSIS — N186 End stage renal disease: Secondary | ICD-10-CM | POA: Diagnosis not present

## 2021-11-03 DIAGNOSIS — N2581 Secondary hyperparathyroidism of renal origin: Secondary | ICD-10-CM | POA: Diagnosis not present

## 2021-11-04 DIAGNOSIS — N186 End stage renal disease: Secondary | ICD-10-CM | POA: Diagnosis not present

## 2021-11-04 DIAGNOSIS — Z992 Dependence on renal dialysis: Secondary | ICD-10-CM | POA: Diagnosis not present

## 2021-11-04 DIAGNOSIS — N2581 Secondary hyperparathyroidism of renal origin: Secondary | ICD-10-CM | POA: Diagnosis not present

## 2021-11-05 DIAGNOSIS — N2581 Secondary hyperparathyroidism of renal origin: Secondary | ICD-10-CM | POA: Diagnosis not present

## 2021-11-05 DIAGNOSIS — N186 End stage renal disease: Secondary | ICD-10-CM | POA: Diagnosis not present

## 2021-11-05 DIAGNOSIS — Z992 Dependence on renal dialysis: Secondary | ICD-10-CM | POA: Diagnosis not present

## 2021-11-05 NOTE — Progress Notes (Deleted)
? ?Office Visit  ?  ?Patient Name: Hector Mahoney ?Date of Encounter: 11/05/2021 ? ?PCP:  Biagio Borg, MD ?  ?[place]  ?Cardiologist:  Skeet Latch, MD  ?Advanced Practice Provider:  No care team member to display ?Electrophysiologist:  None  ?   ? ?Chief Complaint  ?  ?Hector Mahoney is a 51 y.o. male with a hx of chronic diastolic heart failure, HTN, HLD, IDDM, ESRD on HD presents today for follow up of HTN, diastolic heart failure  ? ?Past Medical History  ?  ?Past Medical History:  ?Diagnosis Date  ? Anemia   ? CHF (congestive heart failure) ([place])   ? Esophageal reflux 04/27/2009  ? ESRD (end stage renal disease) on dialysis [facility])   ? home HD 4x/week  ? HYPERLIPIDEMIA 04/24/2007  ? HYPERTENSION 04/24/2007  ? Insulin dependent type 1 diabetes mellitus ([place])   ? ?Past Surgical History:  ?Procedure Laterality Date  ? AV FISTULA PLACEMENT Left 06/04/2019  ? Procedure: BRACHIAL-CEPHALIC ARTERIOVENOUS (AV) FISTULA CREATION LEFT ARM;  Surgeon: Rosetta Posner, MD;  Location: [place];  Service: Vascular;  Laterality: Left;  ? CAPD INSERTION N/A 08/24/2021  ? Procedure: LAPAROSCOPIC INSERTION CONTINUOUS AMBULATORY PERITONEAL DIALYSIS  (CAPD) CATHETER;  Surgeon: Cherre Robins, MD;  Location: [facility] OR;  Service: Vascular;  Laterality: N/A;  ? COLONOSCOPY    ? polyp  ? IR FLUORO GUIDE CV LINE RIGHT  10/22/2017  ? IR THROMBECTOMY AV FISTULA W/THROMBOLYSIS/PTA/STENT INC/SHUNT/IMG LT Left 03/2021  ? IR US GUIDE VASC ACCESS RIGHT  10/22/2017  ? MOUTH SURGERY    ? tooth ext  ? ? ?Allergies ? ?No Known Allergies ? ?History of Present Illness  ?  ?Hector Mahoney is a 51 y.o. male with a hx of *** last seen 10/2019 via telemedicine. ? ?Prior echo 2014 with grade 3 diastolic dyfunction. Prior stress test 2018 at [facility] and 2021 in [place]. Echo 2019 with grade 2 diastolic dysfunction. Last seen 10/2019 via telemedicine by Doreene Adas, PA. BP was well controlled and was doing overall well from a  cardiac perspective. Imdur previously discontinued by nephrology.  Was considering renal-pancreas transplant in [place] with [facility]. Since last seen had echo 02/2021 LVEF 45-50%, LV globl hypokinesis, indeterminate diastolic parameters.  ? ?*** ??Peritonieal dialysis? ? ?EKGs/Labs/Other Studies Reviewed:  ? ?The following studies were reviewed today: ?Echo 02/2021 ? 1. Left ventricular ejection fraction, by estimation, is 45 to 50%. The  ?left ventricle has mildly decreased function. The left ventricle  ?demonstrates global hypokinesis. Left ventricular diastolic parameters are  ?indeterminate.  ? 2. Right ventricular systolic function is normal. The right ventricular  ?size is normal.  ? 3. The mitral valve is normal in structure. No evidence of mitral valve  ?regurgitation. No evidence of mitral stenosis.  ? 4. The aortic valve is normal in structure. Aortic valve regurgitation is  ?not visualized. No aortic stenosis is present.  ? 5. The inferior vena cava is normal in size with greater than 50%  ?respiratory variability, suggesting right atrial pressure of 3 mmHg.  ? ? ?Echo 09/27/17: ? ?- Left ventricle: The cavity size was normal. There was severe  ?  concentric hypertrophy. Systolic function was normal. The  ?  estimated ejection fraction was in the range of 60% to 65%. Wall  ?  motion was normal; there were no regional wall motion  ?  abnormalities. Doppler parameters are consistent with  ?  pseudonormal left ventricular relaxation (  grade 2 diastolic  ?  dysfunction). The E/e&' ratio is >15, suggesting elevated LV  ?  filling pressure.  ?- Mitral valve: Mildly thickened leaflets . There was mild  ?  regurgitation.  ?- Left atrium: Mildly dilated.  ?- Inferior vena cava: The vessel was normal in size. The  ?  respirophasic diameter changes were in the normal range (>= 50%),  ?  consistent with normal central venous pressure.  ? ?EKG:  EKG is *** ordered today.  The ekg ordered today demonstrates *** ? ?Recent  Labs: ?12/25/2020: ALT 23; Magnesium 2.3 ?02/14/2021: B Natriuretic Peptide 446.6; Platelets 267 ?08/24/2021: BUN 41; Creatinine, Ser 9.70; Hemoglobin 15.0; Potassium 4.1; Sodium 137  ?Recent Lipid Panel ?   ?Component Value Date/Time  ? CHOL 183 06/25/2019 1551  ? TRIG 214.0 (H) 06/25/2019 1551  ? HDL 58.40 06/25/2019 1551  ? CHOLHDL 3 06/25/2019 1551  ? VLDL 42.8 (H) 06/25/2019 1551  ? LDLDIRECT 80.0 06/25/2019 1551  ? ? ?Home Medications  ? ?No outpatient medications have been marked as taking for the 11/06/21 encounter (Appointment) with Loel Dubonnet, NP.  ?  ? ?Review of Systems  ? ***   ?All other systems reviewed and are otherwise negative except as noted above. ? ?Physical Exam  ?  ?VS:  There were no vitals taken for this visit. , BMI There is no height or weight on file to calculate BMI. ? ?Wt Readings from Last 3 Encounters:  ?09/27/21 215 lb 12.8 oz (97.9 kg)  ?08/24/21 205 lb (93 kg)  ?07/25/21 211 lb (95.7 kg)  ?  ? ?GEN: Well nourished, well developed, in no acute distress. ?HEENT: normal. ?Neck: Supple, no JVD, carotid bruits, or masses. ?Cardiac: ***RRR, no murmurs, rubs, or gallops. No clubbing, cyanosis, edema.  ***Radials/PT 2+ and equal bilaterally.  ?Respiratory:  ***Respirations regular and unlabored, clear to auscultation bilaterally. ?GI: Soft, nontender, nondistended. ?MS: No deformity or atrophy. ?Skin: Warm and dry, no rash. ?Neuro:  Strength and sensation are intact. ?Psych: Normal affect. ? ?Assessment & Plan  ?  ?HTN - ?Chronic diastolic heart failure -  ?HLD -  ?IDDM -  ?ESRD on HD -  ? ? ?Disposition: Follow up {follow up:15908} with Skeet Latch, MD or APP. ? ?Signed, ?Loel Dubonnet, NP ?11/05/2021, 1:37 PM ?[place] ?

## 2021-11-06 ENCOUNTER — Ambulatory Visit (HOSPITAL_BASED_OUTPATIENT_CLINIC_OR_DEPARTMENT_OTHER): Payer: Federal, State, Local not specified - PPO | Admitting: Family

## 2021-11-06 DIAGNOSIS — N2581 Secondary hyperparathyroidism of renal origin: Secondary | ICD-10-CM | POA: Diagnosis not present

## 2021-11-06 DIAGNOSIS — N186 End stage renal disease: Secondary | ICD-10-CM | POA: Diagnosis not present

## 2021-11-06 DIAGNOSIS — Z992 Dependence on renal dialysis: Secondary | ICD-10-CM | POA: Diagnosis not present

## 2021-11-07 DIAGNOSIS — Z992 Dependence on renal dialysis: Secondary | ICD-10-CM | POA: Diagnosis not present

## 2021-11-07 DIAGNOSIS — N2581 Secondary hyperparathyroidism of renal origin: Secondary | ICD-10-CM | POA: Diagnosis not present

## 2021-11-07 DIAGNOSIS — N186 End stage renal disease: Secondary | ICD-10-CM | POA: Diagnosis not present

## 2021-11-08 DIAGNOSIS — Z992 Dependence on renal dialysis: Secondary | ICD-10-CM | POA: Diagnosis not present

## 2021-11-08 DIAGNOSIS — N186 End stage renal disease: Secondary | ICD-10-CM | POA: Diagnosis not present

## 2021-11-08 DIAGNOSIS — N2581 Secondary hyperparathyroidism of renal origin: Secondary | ICD-10-CM | POA: Diagnosis not present

## 2021-11-08 DIAGNOSIS — Z452 Encounter for adjustment and management of vascular access device: Secondary | ICD-10-CM | POA: Diagnosis not present

## 2021-11-09 DIAGNOSIS — Z794 Long term (current) use of insulin: Secondary | ICD-10-CM | POA: Diagnosis not present

## 2021-11-09 DIAGNOSIS — E1039 Type 1 diabetes mellitus with other diabetic ophthalmic complication: Secondary | ICD-10-CM | POA: Diagnosis not present

## 2021-11-09 DIAGNOSIS — Z992 Dependence on renal dialysis: Secondary | ICD-10-CM | POA: Diagnosis not present

## 2021-11-09 DIAGNOSIS — N2581 Secondary hyperparathyroidism of renal origin: Secondary | ICD-10-CM | POA: Diagnosis not present

## 2021-11-09 DIAGNOSIS — N186 End stage renal disease: Secondary | ICD-10-CM | POA: Diagnosis not present

## 2021-11-09 DIAGNOSIS — E1022 Type 1 diabetes mellitus with diabetic chronic kidney disease: Secondary | ICD-10-CM | POA: Diagnosis not present

## 2021-11-10 DIAGNOSIS — E1022 Type 1 diabetes mellitus with diabetic chronic kidney disease: Secondary | ICD-10-CM | POA: Diagnosis not present

## 2021-11-10 DIAGNOSIS — N186 End stage renal disease: Secondary | ICD-10-CM | POA: Diagnosis not present

## 2021-11-10 DIAGNOSIS — Z992 Dependence on renal dialysis: Secondary | ICD-10-CM | POA: Diagnosis not present

## 2021-11-10 DIAGNOSIS — N2581 Secondary hyperparathyroidism of renal origin: Secondary | ICD-10-CM | POA: Diagnosis not present

## 2021-11-11 DIAGNOSIS — Z992 Dependence on renal dialysis: Secondary | ICD-10-CM | POA: Diagnosis not present

## 2021-11-11 DIAGNOSIS — N186 End stage renal disease: Secondary | ICD-10-CM | POA: Diagnosis not present

## 2021-11-11 DIAGNOSIS — N2581 Secondary hyperparathyroidism of renal origin: Secondary | ICD-10-CM | POA: Diagnosis not present

## 2021-11-12 DIAGNOSIS — N2581 Secondary hyperparathyroidism of renal origin: Secondary | ICD-10-CM | POA: Diagnosis not present

## 2021-11-12 DIAGNOSIS — N186 End stage renal disease: Secondary | ICD-10-CM | POA: Diagnosis not present

## 2021-11-12 DIAGNOSIS — Z992 Dependence on renal dialysis: Secondary | ICD-10-CM | POA: Diagnosis not present

## 2021-11-13 DIAGNOSIS — N186 End stage renal disease: Secondary | ICD-10-CM | POA: Diagnosis not present

## 2021-11-13 DIAGNOSIS — N2581 Secondary hyperparathyroidism of renal origin: Secondary | ICD-10-CM | POA: Diagnosis not present

## 2021-11-13 DIAGNOSIS — Z992 Dependence on renal dialysis: Secondary | ICD-10-CM | POA: Diagnosis not present

## 2021-11-14 DIAGNOSIS — N186 End stage renal disease: Secondary | ICD-10-CM | POA: Diagnosis not present

## 2021-11-14 DIAGNOSIS — Z992 Dependence on renal dialysis: Secondary | ICD-10-CM | POA: Diagnosis not present

## 2021-11-14 DIAGNOSIS — N2581 Secondary hyperparathyroidism of renal origin: Secondary | ICD-10-CM | POA: Diagnosis not present

## 2021-11-15 DIAGNOSIS — N2581 Secondary hyperparathyroidism of renal origin: Secondary | ICD-10-CM | POA: Diagnosis not present

## 2021-11-15 DIAGNOSIS — Z992 Dependence on renal dialysis: Secondary | ICD-10-CM | POA: Diagnosis not present

## 2021-11-15 DIAGNOSIS — N186 End stage renal disease: Secondary | ICD-10-CM | POA: Diagnosis not present

## 2021-11-16 DIAGNOSIS — N2581 Secondary hyperparathyroidism of renal origin: Secondary | ICD-10-CM | POA: Diagnosis not present

## 2021-11-16 DIAGNOSIS — Z992 Dependence on renal dialysis: Secondary | ICD-10-CM | POA: Diagnosis not present

## 2021-11-16 DIAGNOSIS — N186 End stage renal disease: Secondary | ICD-10-CM | POA: Diagnosis not present

## 2021-11-17 DIAGNOSIS — N186 End stage renal disease: Secondary | ICD-10-CM | POA: Diagnosis not present

## 2021-11-17 DIAGNOSIS — Z992 Dependence on renal dialysis: Secondary | ICD-10-CM | POA: Diagnosis not present

## 2021-11-17 DIAGNOSIS — N2581 Secondary hyperparathyroidism of renal origin: Secondary | ICD-10-CM | POA: Diagnosis not present

## 2021-11-18 DIAGNOSIS — N2581 Secondary hyperparathyroidism of renal origin: Secondary | ICD-10-CM | POA: Diagnosis not present

## 2021-11-18 DIAGNOSIS — Z992 Dependence on renal dialysis: Secondary | ICD-10-CM | POA: Diagnosis not present

## 2021-11-18 DIAGNOSIS — N186 End stage renal disease: Secondary | ICD-10-CM | POA: Diagnosis not present

## 2021-11-19 DIAGNOSIS — N186 End stage renal disease: Secondary | ICD-10-CM | POA: Diagnosis not present

## 2021-11-19 DIAGNOSIS — Z992 Dependence on renal dialysis: Secondary | ICD-10-CM | POA: Diagnosis not present

## 2021-11-19 DIAGNOSIS — N2581 Secondary hyperparathyroidism of renal origin: Secondary | ICD-10-CM | POA: Diagnosis not present

## 2021-11-20 DIAGNOSIS — N2581 Secondary hyperparathyroidism of renal origin: Secondary | ICD-10-CM | POA: Diagnosis not present

## 2021-11-20 DIAGNOSIS — N186 End stage renal disease: Secondary | ICD-10-CM | POA: Diagnosis not present

## 2021-11-20 DIAGNOSIS — Z992 Dependence on renal dialysis: Secondary | ICD-10-CM | POA: Diagnosis not present

## 2021-11-21 DIAGNOSIS — Z992 Dependence on renal dialysis: Secondary | ICD-10-CM | POA: Diagnosis not present

## 2021-11-21 DIAGNOSIS — N2581 Secondary hyperparathyroidism of renal origin: Secondary | ICD-10-CM | POA: Diagnosis not present

## 2021-11-21 DIAGNOSIS — N186 End stage renal disease: Secondary | ICD-10-CM | POA: Diagnosis not present

## 2021-11-22 DIAGNOSIS — N2581 Secondary hyperparathyroidism of renal origin: Secondary | ICD-10-CM | POA: Diagnosis not present

## 2021-11-22 DIAGNOSIS — N186 End stage renal disease: Secondary | ICD-10-CM | POA: Diagnosis not present

## 2021-11-22 DIAGNOSIS — Z992 Dependence on renal dialysis: Secondary | ICD-10-CM | POA: Diagnosis not present

## 2021-11-23 ENCOUNTER — Encounter (HOSPITAL_BASED_OUTPATIENT_CLINIC_OR_DEPARTMENT_OTHER): Payer: Self-pay | Admitting: Emergency Medicine

## 2021-11-23 ENCOUNTER — Other Ambulatory Visit: Payer: Self-pay

## 2021-11-23 ENCOUNTER — Emergency Department (HOSPITAL_BASED_OUTPATIENT_CLINIC_OR_DEPARTMENT_OTHER)
Admission: EM | Admit: 2021-11-23 | Discharge: 2021-11-23 | Disposition: A | Discharge status: Home / Self Care | Payer: Federal, State, Local not specified - PPO | Attending: Emergency Medicine | Admitting: Emergency Medicine

## 2021-11-23 ENCOUNTER — Emergency Department (HOSPITAL_BASED_OUTPATIENT_CLINIC_OR_DEPARTMENT_OTHER): Payer: Federal, State, Local not specified - PPO

## 2021-11-23 DIAGNOSIS — Z79899 Other long term (current) drug therapy: Secondary | ICD-10-CM | POA: Insufficient documentation

## 2021-11-23 DIAGNOSIS — E875 Hyperkalemia: Secondary | ICD-10-CM | POA: Insufficient documentation

## 2021-11-23 DIAGNOSIS — R630 Anorexia: Secondary | ICD-10-CM | POA: Insufficient documentation

## 2021-11-23 DIAGNOSIS — Z992 Dependence on renal dialysis: Secondary | ICD-10-CM | POA: Insufficient documentation

## 2021-11-23 DIAGNOSIS — K429 Umbilical hernia without obstruction or gangrene: Secondary | ICD-10-CM | POA: Insufficient documentation

## 2021-11-23 DIAGNOSIS — Z794 Long term (current) use of insulin: Secondary | ICD-10-CM | POA: Diagnosis not present

## 2021-11-23 DIAGNOSIS — D72829 Elevated white blood cell count, unspecified: Secondary | ICD-10-CM | POA: Insufficient documentation

## 2021-11-23 DIAGNOSIS — R531 Weakness: Secondary | ICD-10-CM | POA: Diagnosis not present

## 2021-11-23 DIAGNOSIS — I509 Heart failure, unspecified: Secondary | ICD-10-CM | POA: Diagnosis not present

## 2021-11-23 DIAGNOSIS — K828 Other specified diseases of gallbladder: Secondary | ICD-10-CM | POA: Diagnosis not present

## 2021-11-23 DIAGNOSIS — E1022 Type 1 diabetes mellitus with diabetic chronic kidney disease: Secondary | ICD-10-CM | POA: Diagnosis not present

## 2021-11-23 DIAGNOSIS — I120 Hypertensive chronic kidney disease with stage 5 chronic kidney disease or end stage renal disease: Secondary | ICD-10-CM | POA: Diagnosis not present

## 2021-11-23 DIAGNOSIS — I10 Essential (primary) hypertension: Secondary | ICD-10-CM | POA: Diagnosis not present

## 2021-11-23 DIAGNOSIS — N186 End stage renal disease: Secondary | ICD-10-CM | POA: Insufficient documentation

## 2021-11-23 DIAGNOSIS — Z7982 Long term (current) use of aspirin: Secondary | ICD-10-CM | POA: Insufficient documentation

## 2021-11-23 DIAGNOSIS — R110 Nausea: Secondary | ICD-10-CM | POA: Diagnosis not present

## 2021-11-23 DIAGNOSIS — N2581 Secondary hyperparathyroidism of renal origin: Secondary | ICD-10-CM | POA: Diagnosis not present

## 2021-11-23 DIAGNOSIS — I132 Hypertensive heart and chronic kidney disease with heart failure and with stage 5 chronic kidney disease, or end stage renal disease: Secondary | ICD-10-CM | POA: Insufficient documentation

## 2021-11-23 LAB — CBC
HCT: 35.2 % — ABNORMAL LOW (ref 39.0–52.0)
Hemoglobin: 11.3 g/dL — ABNORMAL LOW (ref 13.0–17.0)
MCH: 28 pg (ref 26.0–34.0)
MCHC: 32.1 g/dL (ref 30.0–36.0)
MCV: 87.1 fL (ref 80.0–100.0)
Platelets: 299 10*3/uL (ref 150–400)
RBC: 4.04 MIL/uL — ABNORMAL LOW (ref 4.22–5.81)
RDW: 16 % — ABNORMAL HIGH (ref 11.5–15.5)
WBC: 11 10*3/uL — ABNORMAL HIGH (ref 4.0–10.5)
nRBC: 0 % (ref 0.0–0.2)

## 2021-11-23 LAB — COMPREHENSIVE METABOLIC PANEL
ALT: 24 U/L (ref 0–44)
AST: 13 U/L — ABNORMAL LOW (ref 15–41)
Albumin: 4.3 g/dL (ref 3.5–5.0)
Alkaline Phosphatase: 105 U/L (ref 38–126)
Anion gap: 24 — ABNORMAL HIGH (ref 5–15)
BUN: 105 mg/dL — ABNORMAL HIGH (ref 6–20)
CO2: 24 mmol/L (ref 22–32)
Calcium: 9.3 mg/dL (ref 8.9–10.3)
Chloride: 87 mmol/L — ABNORMAL LOW (ref 98–111)
Creatinine, Ser: 27.76 mg/dL — ABNORMAL HIGH (ref 0.61–1.24)
GFR, Estimated: 2 mL/min — ABNORMAL LOW (ref >60)
Glucose, Bld: 140 mg/dL — ABNORMAL HIGH (ref 70–99)
Potassium: 5.2 mmol/L — ABNORMAL HIGH (ref 3.5–5.1)
Sodium: 135 mmol/L (ref 135–145)
Total Bilirubin: 0.4 mg/dL (ref 0.3–1.2)
Total Protein: 8.3 g/dL — ABNORMAL HIGH (ref 6.5–8.1)

## 2021-11-23 LAB — DIFFERENTIAL
Abs Immature Granulocytes: 0.09 10*3/uL — ABNORMAL HIGH (ref 0.00–0.07)
Basophils Absolute: 0.1 10*3/uL (ref 0.0–0.1)
Basophils Relative: 1 %
Eosinophils Absolute: 0.3 10*3/uL (ref 0.0–0.5)
Eosinophils Relative: 3 %
Immature Granulocytes: 1 %
Lymphocytes Relative: 19 %
Lymphs Abs: 2.1 10*3/uL (ref 0.7–4.0)
Monocytes Absolute: 1 10*3/uL (ref 0.1–1.0)
Monocytes Relative: 9 %
Neutro Abs: 7.4 10*3/uL (ref 1.7–7.7)
Neutrophils Relative %: 67 %

## 2021-11-23 LAB — LIPASE, BLOOD: Lipase: 18 U/L (ref 11–51)

## 2021-11-23 LAB — MAGNESIUM: Magnesium: 2.4 mg/dL (ref 1.7–2.4)

## 2021-11-23 NOTE — ED Triage Notes (Signed)
Pt arrives top ED with c/o nausea, weakness, and no appetite since 11/19/21. He reports twitching and muscle spasms for months that has worsened over the past week. The muscle spasms mare now lasting hours. Associated symptoms include vomiting. No fevers, diarrhea. Hx dialysis, ESRD, type 1 diabetes.  ?

## 2021-11-23 NOTE — ED Provider Notes (Signed)
?[name] ?Provider Note ? ? ?CSN: [id number] ?Arrival date & time: 11/23/21  0930 ? ?  ? ?History ? ?Chief Complaint  ?Patient presents with  ? Nausea  ? Weakness  ? ? ?Hector Mahoney is a 51 y.o. male. ? ?Patient with a complaint of nausea weakness no appetite.  No vomiting no diarrhea no abdominal pain.  Correlates with restarting Ozempic he had trouble with decreased appetite from that before.  Also reporting twitching and muscle spasms has been going on for a long period of time.  Patient is a peritoneal dialysis patient.  And he is type 1 diabetes.  He denies any fevers.  Denies any upper respiratory symptoms. ? ?Past medical history significant for esophageal reflux type 1 diabetes hyperlipidemia hypertension congestive heart failure end-stage renal disease on peritoneal dialysis.  And insulin-dependent diabetes as mentioned. ? ? ?  ? ?Home Medications ?Prior to Admission medications   ?Medication Sig Start Date End Date Taking? Authorizing Provider  ?albuterol (VENTOLIN HFA) 108 (90 Base) MCG/ACT inhaler Inhale 2 puffs into the lungs every 6 (six) hours as needed for wheezing or shortness of breath. 12/20/20   Biagio Borg, MD  ?amLODipine (NORVASC) 5 MG tablet Take 5 mg by mouth at bedtime. 08/03/21   [provider]  ?aspirin 81 MG EC tablet Take by mouth. 10/26/17   [provider]  ?atorvastatin (LIPITOR) 80 MG tablet Take 80 mg by mouth daily. 02/26/18   [provider]  ?AURYXIA 1 GM 210 MG(Fe) tablet Take 420 mg by mouth 3 (three) times daily. 04/04/21   [provider]  ?calcitRIOL (ROCALTROL) 0.25 MCG capsule Take 1 capsule (0.25 mcg total) by mouth every Monday, Wednesday, and Friday at 6 PM. 09/30/17   Thurnell Lose, MD  ?cetirizine (ZYRTEC) 10 MG tablet Take 1 tablet by mouth once daily 04/18/21   Biagio Borg, MD  ?cinacalcet (SENSIPAR) 30 MG tablet Take 60 mg by mouth every evening.    [provider]  ?Continuous  Blood Gluc Sensor (FREESTYLE LIBRE 14 DAY SENSOR) MISC 1 each by Other route daily. 07/22/20   [provider]  ?dicyclomine (BENTYL) 20 MG tablet Take 1 tablet (20 mg total) by mouth in the morning and at bedtime. 04/19/21   Levin Erp, PA  ?gabapentin (NEURONTIN) 300 MG capsule TAKE 1 TO 2 CAPSULES BY MOUTH AT BEDTIME AS NEEDED 07/26/21   Biagio Borg, MD  ?HYDROcodone-acetaminophen (NORCO) 5-325 MG tablet Take 1 tablet by mouth every 6 (six) hours as needed for moderate pain. 08/24/21   Dagoberto Ligas, PA-C  ?insulin aspart (NOVOLOG) 100 UNIT/ML injection INJECT 8 TO 12 UNITS INTO THE SKIN THREE TIMES DAILY WITH MEALS ?Patient taking differently: Inject 8-12 Units into the skin 3 (three) times daily with meals. 10/10/20   Biagio Borg, MD  ?insulin glargine (LANTUS) 100 UNIT/ML injection Inject 0.42 mLs (42 Units total) into the skin at bedtime. 07/25/21   Shamleffer, Melanie Crazier, MD  ?levETIRAcetam (KEPPRA) 250 MG tablet Take 1 tablet (250 mg total) by mouth at bedtime. 04/20/21   Biagio Borg, MD  ?losartan (COZAAR) 50 MG tablet Take 1 tablet by mouth daily. 04/11/21   [provider]  ?meclizine (ANTIVERT) 12.5 MG tablet TAKE 1 TABLET BY MOUTH THREE TIMES DAILY AS NEEDED FOR DIZZINESS ?Patient taking differently: Take 12.5 mg by mouth 3 (three) times daily as needed for dizziness. 05/28/20   Biagio Borg, MD  ?metoprolol succinate (TOPROL-XL)  25 MG 24 hr tablet Take 25 mg by mouth daily. 03/27/21   [provider]  ?multivitamin (RENA-VIT) TABS tablet Take 1 tablet by mouth daily. 03/20/19   [provider]  ?NORVASC 10 MG tablet Take 10 mg by mouth at bedtime. 01/26/21   [provider]  ?Nutritional Supplements (VITAMIN D BOOSTER PO) Take 1 mg by mouth 3 (three) times a week. 04/07/21 04/06/22  [provider]  ?omeprazole (PRILOSEC) 40 MG capsule Take 1 capsule (40 mg total) by mouth 2 (two) times daily. 04/19/21   Levin Erp, PA   ?ondansetron (ZOFRAN) 4 MG tablet Take 1 tablet (4 mg total) by mouth every 8 (eight) hours as needed for nausea or vomiting. 12/20/20   Biagio Borg, MD  ?polyethylene glycol powder (GLYCOLAX/MIRALAX) 17 GM/SCOOP powder Take 1 Container by mouth daily as needed for mild constipation. 10/25/17   [provider]  ?rOPINIRole (REQUIP) 1 MG tablet Take 1 mg by mouth at bedtime. 08/12/19   [provider]  ?rosuvastatin (CRESTOR) 10 MG tablet Take 1 tablet (10 mg total) by mouth daily. 10/25/17   Rai, Ripudeep K, MD  ?Semaglutide, 1 MG/DOSE, (OZEMPIC, 1 MG/DOSE,) 4 MG/3ML SOPN Inject 1 mg into the skin once a week. ?Patient taking differently: Inject 1 mg into the skin every Sunday. 07/25/21   Shamleffer, Melanie Crazier, MD  ?triamcinolone (NASACORT) 55 MCG/ACT AERO nasal inhaler Place 2 sprays into the nose daily. ?Patient taking differently: Place 2 sprays into the nose daily as needed (allergies). 05/21/20   McGowen, Adrian Blackwater, MD  ?   ? ?Allergies    ?Patient has no known allergies.   ? ?Review of Systems   ?Review of Systems  ?Constitutional:  Positive for appetite change. Negative for chills and fever.  ?HENT:  Negative for ear pain and sore throat.   ?Eyes:  Negative for pain and visual disturbance.  ?Respiratory:  Negative for cough and shortness of breath.   ?Cardiovascular:  Negative for chest pain and palpitations.  ?Gastrointestinal:  Positive for nausea. Negative for abdominal pain, diarrhea and vomiting.  ?Genitourinary:  Negative for dysuria and hematuria.  ?Musculoskeletal:  Positive for myalgias. Negative for arthralgias and back pain.  ?Skin:  Negative for color change and rash.  ?Neurological:  Negative for seizures and syncope.  ?All other systems reviewed and are negative. ? ?Physical Exam ?Updated Vital Signs ?BP (!) 147/90 (BP Location: Right Arm)   Pulse 94   Temp 98 ?F (36.7 ?C)   Resp 18   Ht 1.676 m ('5\' 6"'$ )   Wt 95.7 kg   SpO2 100%   BMI 34.06 kg/m?  ?Physical  Exam ?Vitals and nursing note reviewed.  ?Constitutional:   ?   General: He is not in acute distress. ?   Appearance: Normal appearance. He is well-developed.  ?HENT:  ?   Head: Normocephalic and atraumatic.  ?Eyes:  ?   Extraocular Movements: Extraocular movements intact.  ?   Conjunctiva/sclera: Conjunctivae normal.  ?   Pupils: Pupils are equal, round, and reactive to light.  ?Cardiovascular:  ?   Rate and Rhythm: Normal rate and regular rhythm.  ?   Heart sounds: No murmur heard. ?Pulmonary:  ?   Effort: Pulmonary effort is normal. No respiratory distress.  ?   Breath sounds: Normal breath sounds.  ?Abdominal:  ?   General: There is distension.  ?   Palpations: Abdomen is soft.  ?   Tenderness: There is no abdominal tenderness.  There is no guarding.  ?   Comments: Peritoneal dialysis catheter lower part of the abdomen  ?Musculoskeletal:     ?   General: No swelling.  ?   Cervical back: Normal range of motion and neck supple.  ?   Comments: Currently no muscle spasms or any twitching  ?Skin: ?   General: Skin is warm and dry.  ?   Capillary Refill: Capillary refill takes less than 2 seconds.  ?Neurological:  ?   General: No focal deficit present.  ?   Mental Status: He is alert and oriented to person, place, and time.  ?   Cranial Nerves: No cranial nerve deficit.  ?   Sensory: No sensory deficit.  ?Psychiatric:     ?   Mood and Affect: Mood normal.  ? ? ?ED Results / Procedures / Treatments   ?Labs ?(all labs ordered are listed, but only abnormal results are displayed) ?Labs Reviewed  ?LIPASE, BLOOD  ?COMPREHENSIVE METABOLIC PANEL  ?CBC  ?URINALYSIS, ROUTINE W REFLEX MICROSCOPIC  ? ? ?EKG ?EKG Interpretation ? ?Date/Time:  Thursday November 23 2021 10:09:12 EDT ?Ventricular Rate:  94 ?PR Interval:  214 ?QRS Duration: 92 ?QT Interval:  384 ?QTC Calculation: 481 ?R Axis:   -30 ?Text Interpretation: Sinus rhythm Prolonged PR interval Left axis deviation Borderline prolonged QT interval No significant change since last  tracing Confirmed by Fredia Sorrow [phone]) on 11/23/2021 10:21:31 AM ? ?Radiology ?No results found. ? ?Procedures ?Procedures  ? ? ?Medications Ordered in ED ?Medications - No data to display ? ?ED Course/ [facility]

## 2021-11-23 NOTE — ED Notes (Signed)
EMT-P provided AVS using Teachback Method. Patient verbalizes understanding of Discharge Instructions. Opportunity for Questioning and Answers were provided by EMT-P. Patient Discharged from ED.  ? ?

## 2021-11-23 NOTE — Discharge Instructions (Addendum)
With your primary care doctor and your nephrologist.  Today's work-up without significant abnormalities. ?

## 2021-11-24 DIAGNOSIS — Z992 Dependence on renal dialysis: Secondary | ICD-10-CM | POA: Diagnosis not present

## 2021-11-24 DIAGNOSIS — N186 End stage renal disease: Secondary | ICD-10-CM | POA: Diagnosis not present

## 2021-11-24 DIAGNOSIS — N2581 Secondary hyperparathyroidism of renal origin: Secondary | ICD-10-CM | POA: Diagnosis not present

## 2021-11-25 DIAGNOSIS — N2581 Secondary hyperparathyroidism of renal origin: Secondary | ICD-10-CM | POA: Diagnosis not present

## 2021-11-25 DIAGNOSIS — N186 End stage renal disease: Secondary | ICD-10-CM | POA: Diagnosis not present

## 2021-11-25 DIAGNOSIS — Z992 Dependence on renal dialysis: Secondary | ICD-10-CM | POA: Diagnosis not present

## 2021-11-26 DIAGNOSIS — N2581 Secondary hyperparathyroidism of renal origin: Secondary | ICD-10-CM | POA: Diagnosis not present

## 2021-11-26 DIAGNOSIS — N186 End stage renal disease: Secondary | ICD-10-CM | POA: Diagnosis not present

## 2021-11-26 DIAGNOSIS — Z992 Dependence on renal dialysis: Secondary | ICD-10-CM | POA: Diagnosis not present

## 2021-11-27 DIAGNOSIS — N186 End stage renal disease: Secondary | ICD-10-CM | POA: Diagnosis not present

## 2021-11-27 DIAGNOSIS — N2581 Secondary hyperparathyroidism of renal origin: Secondary | ICD-10-CM | POA: Diagnosis not present

## 2021-11-27 DIAGNOSIS — Z992 Dependence on renal dialysis: Secondary | ICD-10-CM | POA: Diagnosis not present

## 2021-11-28 ENCOUNTER — Ambulatory Visit: Payer: Federal, State, Local not specified - PPO | Admitting: Internal Medicine

## 2021-11-28 DIAGNOSIS — N2581 Secondary hyperparathyroidism of renal origin: Secondary | ICD-10-CM | POA: Diagnosis not present

## 2021-11-28 DIAGNOSIS — N186 End stage renal disease: Secondary | ICD-10-CM | POA: Diagnosis not present

## 2021-11-28 DIAGNOSIS — Z992 Dependence on renal dialysis: Secondary | ICD-10-CM | POA: Diagnosis not present

## 2021-11-28 NOTE — Progress Notes (Deleted)
Name: Hector Mahoney  MRN/ DOB: [id number], [birth year]   Age/ Sex: 51 y.o., male    PCP: Biagio Borg, MD   Reason for Endocrinology Evaluation: Type 2 Diabetes Mellitus     Date of Initial Endocrinology Visit: 11/28/2021     PATIENT IDENTIFIER: [name] is a 51 y.o. male with a past medical history of T2DM, HTN and Dyslipidemia . The patient presented for initial endocrinology clinic visit on 11/28/2021 for consultative assistance with his diabetes management.    HPI: Hector Mahoney was    Diagnosed with DM in 1998  Prior Medications tried/Intolerance: Has been on insulin since his diagnosis.  Currently checking blood sugars multiple x / day,  through CGM .  Hypoglycemia episodes : yes               Symptoms: yes                 Frequency: rare Hemoglobin A1c has ranged from 7.3% in 2019, peaking at 11.4% in 2014. Patient required assistance for hypoglycemia:  Patient has required hospitalization within the last 1 year from hyper or hypoglycemia: no  In terms of diet, the patient eats 3 meals a day   Dialysis days Monday Wednesday and Friday . He is on two transplant lists   Drinks juice and sweet tea   Had recent cataract sx  Has feet numbing at night, pending neurology appointment  Follows with the [facility]: Lantus 38  units daily  Novolog 8-12 units with meals .    Statin: yes ACE-I/ARB: yes    CONTINUOUS GLUCOSE MONITORING RECORD INTERPRETATION    Dates of Recording: 11/30-12/13/2022  Sensor description: dexcom   Results statistics:   CGM use % of time 21  Average and SD 250/69  Time in range    14    %  % Time Above 180 49  % Time above 250 37  % Time Below target 0      Glycemic patterns summary: High glucose during the day and night   Hyperglycemic episodes  all day and night, worse post prandial   Hypoglycemic episodes occurred post bolus   Overnight periods: high       DIABETIC  COMPLICATIONS: Microvascular complications:  DR ( on monthly injections ), ESRD on HD  Last eye exam: Completed 06/2021  Macrovascular complications:  CHF Denies: CAD, PVD, CVA   PAST HISTORY: Past Medical History:  Past Medical History:  Diagnosis Date   Anemia    CHF (congestive heart failure) ([facility])    Esophageal reflux 04/27/2009   ESRD (end stage renal disease) on dialysis ([facility])    home HD 4x/week   HYPERLIPIDEMIA 04/24/2007   HYPERTENSION 04/24/2007   Insulin dependent type 1 diabetes mellitus ([facility])    Past Surgical History:  Past Surgical History:  Procedure Laterality Date   AV FISTULA PLACEMENT Left 06/04/2019   Procedure: BRACHIAL-CEPHALIC ARTERIOVENOUS (AV) FISTULA CREATION LEFT ARM;  Surgeon: Rosetta Posner, MD;  Location: [facility];  Service: Vascular;  Laterality: Left;   CAPD INSERTION N/A 08/24/2021   Procedure: LAPAROSCOPIC INSERTION CONTINUOUS AMBULATORY PERITONEAL DIALYSIS  (CAPD) CATHETER;  Surgeon: Cherre Robins, MD;  Location: [facility] OR;  Service: Vascular;  Laterality: N/A;   COLONOSCOPY     polyp   IR FLUORO GUIDE CV LINE RIGHT  10/22/2017   IR THROMBECTOMY AV FISTULA W/THROMBOLYSIS/PTA/STENT INC/SHUNT/IMG LT Left 03/2021   IR US GUIDE VASC  ACCESS RIGHT  10/22/2017   MOUTH SURGERY     tooth ext    Social History:  reports that he quit smoking about 6 years ago. His smoking use included cigarettes. He has never used smokeless tobacco. He reports that he does not currently use alcohol. He reports that he does not use drugs. Family History:  Family History  Problem Relation Age of Onset   Hypertension Mother    Diabetes Mother    Diabetes Maternal Aunt    Lung cancer Maternal Grandfather    Colon polyps Maternal Uncle    Colon cancer Maternal Uncle    Heart disease Maternal Uncle    Kidney disease Neg Hx    Esophageal cancer Neg Hx    Rectal cancer Neg Hx    Stomach cancer Neg Hx      HOME MEDICATIONS: Allergies as of 11/28/2021   No Known  Allergies      Medication List        Accurate as of November 28, 2021  9:58 AM. If you have any questions, ask your nurse or doctor.          albuterol 108 (90 Base) MCG/ACT inhaler Commonly known as: VENTOLIN HFA Inhale 2 puffs into the lungs every 6 (six) hours as needed for wheezing or shortness of breath.   aspirin 81 MG EC tablet Take by mouth.   atorvastatin 80 MG tablet Commonly known as: LIPITOR Take 80 mg by mouth daily.   Auryxia 1 GM 210 MG(Fe) tablet Generic drug: ferric citrate Take 420 mg by mouth 3 (three) times daily.   calcitRIOL 0.25 MCG capsule Commonly known as: Rocaltrol Take 1 capsule (0.25 mcg total) by mouth every Monday, Wednesday, and Friday at 6 PM.   cetirizine 10 MG tablet Commonly known as: ZYRTEC Take 1 tablet by mouth once daily   cinacalcet 30 MG tablet Commonly known as: SENSIPAR Take 60 mg by mouth every evening.   dicyclomine 20 MG tablet Commonly known as: BENTYL Take 1 tablet (20 mg total) by mouth in the morning and at bedtime.   FreeStyle Libre 14 Day Sensor Misc 1 each by Other route daily.   gabapentin 300 MG capsule Commonly known as: NEURONTIN TAKE 1 TO 2 CAPSULES BY MOUTH AT BEDTIME AS NEEDED   HYDROcodone-acetaminophen 5-325 MG tablet Commonly known as: Norco Take 1 tablet by mouth every 6 (six) hours as needed for moderate pain.   insulin aspart 100 UNIT/ML injection Commonly known as: novoLOG INJECT 8 TO 12 UNITS INTO THE SKIN THREE TIMES DAILY WITH MEALS What changed: See the new instructions.   insulin glargine 100 UNIT/ML injection Commonly known as: Lantus Inject 0.42 mLs (42 Units total) into the skin at bedtime.   levETIRAcetam 250 MG tablet Commonly known as: Keppra Take 1 tablet (250 mg total) by mouth at bedtime.   losartan 50 MG tablet Commonly known as: COZAAR Take 1 tablet by mouth daily.   meclizine 12.5 MG tablet Commonly known as: ANTIVERT TAKE 1 TABLET BY MOUTH THREE TIMES DAILY AS  NEEDED FOR DIZZINESS What changed: See the new instructions.   metoprolol succinate 25 MG 24 hr tablet Commonly known as: TOPROL-XL Take 25 mg by mouth daily.   multivitamin Tabs tablet Take 1 tablet by mouth daily.   Norvasc 10 MG tablet Generic drug: amLODipine Take 10 mg by mouth at bedtime.   amLODipine 5 MG tablet Commonly known as: NORVASC Take 5 mg by mouth at bedtime.   omeprazole  40 MG capsule Commonly known as: PRILOSEC Take 1 capsule (40 mg total) by mouth 2 (two) times daily.   ondansetron 4 MG tablet Commonly known as: Zofran Take 1 tablet (4 mg total) by mouth every 8 (eight) hours as needed for nausea or vomiting.   Ozempic (1 MG/DOSE) 4 MG/3ML Sopn Generic drug: Semaglutide (1 MG/DOSE) Inject 1 mg into the skin once a week. What changed: when to take this   polyethylene glycol powder 17 GM/SCOOP powder Commonly known as: GLYCOLAX/MIRALAX Take 1 Container by mouth daily as needed for mild constipation.   rOPINIRole 1 MG tablet Commonly known as: REQUIP Take 1 mg by mouth at bedtime.   rosuvastatin 10 MG tablet Commonly known as: CRESTOR Take 1 tablet (10 mg total) by mouth daily.   triamcinolone 55 MCG/ACT Aero nasal inhaler Commonly known as: NASACORT Place 2 sprays into the nose daily. What changed:  when to take this reasons to take this   VITAMIN D BOOSTER PO Take 1 mg by mouth 3 (three) times a week.         ALLERGIES: No Known Allergies   REVIEW OF SYSTEMS: A comprehensive ROS was conducted with the patient and is negative except as per HPI and below:  Review of Systems  Gastrointestinal:  Negative for diarrhea, nausea and vomiting.  Neurological:  Positive for tingling.     OBJECTIVE:   VITAL SIGNS: There were no vitals taken for this visit.   PHYSICAL EXAM:  General: Pt appears well and is in NAD  Neck: General: Supple without adenopathy or carotid bruits. Thyroid: Thyroid size normal.  No goiter or nodules  appreciated.  Lungs: Clear with good BS bilat with no rales, rhonchi, or wheezes  Heart: RRR with normal S1 and S2 and no gallops; no murmurs; no rub  Abdomen: Normoactive bowel sounds, soft, nontender, without masses or organomegaly palpable  Extremities:  Lower extremities - No pretibial edema.   Skin: Normal texture and temperature to palpation. No rash noted. No Acanthosis nigricans/skin tags. No lipohypertrophy.  Neuro: MS is good with appropriate affect, pt is alert and Ox3    DM foot exam: 07/25/2021  The skin of the feet is without sores or ulcerations.Callous formation at the 1st MT head B/L  The pedal pulses are undetectable  The sensation is decreased  to a screening 5.07, 10 gram monofilament on the left     DATA REVIEWED:  Lab Results  Component Value Date   HGBA1C 9.3 (A) 07/25/2021   HGBA1C 7.3 (H) 12/22/2020   HGBA1C 9.3 (H) 06/25/2019   Lab Results  Component Value Date   CREATININE 27.76 (H) 11/23/2021   No results found for: [facility]  Lab Results  Component Value Date   CHOL 183 06/25/2019   HDL 58.40 06/25/2019   LDLDIRECT 80.0 06/25/2019   TRIG 214.0 (H) 06/25/2019   CHOLHDL 3 06/25/2019        ASSESSMENT / PLAN / RECOMMENDATIONS:   1) Type 2 Diabetes Mellitus, Poorly controlled, With neuropathic, retinopathic  complications and ESRD on HD  - Most recent A1c of 9.3 %. Goal A1c < 7.0 %.    Plan: GENERAL: I have discussed with the patient the pathophysiology of diabetes. We went over the natural progression of the disease. We talked about both insulin resistance and insulin deficiency. We stressed the importance of lifestyle changes including diet and exercise. I explained the complications associated with diabetes including retinopathy, nephropathy, neuropathy as well as increased risk of cardiovascular  disease. We went over the benefit seen with glycemic control.   I explained to the patient that diabetic patients are at higher than normal  risk for amputations. Unfortunately, he already has complication but he is on the transplant list and I have encouraged him to improve glycemic control  Will increase Ozempic  Will adjust his insulin as below  He is not sure if the insulin pump is for him or not, I have asked him to look up the Omnipod 5  He has dietary indiscretions with snacks , we used this as a teaching moment  He gets his insulin from the [place]   MEDICATIONS: - Increase Ozempic 1 mg weekly  - Increase Lantus to 42 units daily  - Novolog 12 units with each meal  Correction scale : Novolog (BG -130/25)   EDUCATION / INSTRUCTIONS: BG monitoring instructions: Patient is instructed to check his blood sugars 3 times a day, before meals . Call [facility] if: BG persistently < 70  I reviewed the Rule of 15 for the treatment of hypoglycemia in detail with the patient. Literature supplied.   2) Diabetic complications:  Eye: Does  have known diabetic retinopathy.  Neuro/ Feet: Does  have known diabetic peripheral neuropathy. Renal: Patient does  have known baseline CKD. He is  on an ACEI/ARB at present.   3) Decrease [place] Pedis Pulses :   - He has tingling and discomfort at night. He has an upcoming appointment with neurology  - Will proceed with ABI    4) Dyslipidemia    - LDL at goal  - Despite statin therapy has not shows to improve mortality in Hemodialysis pt but I would like to keep him on it due to future of renal transplant.   Continue atorvastatin 80 mg daily   F/U in 4 months            Signed electronically by: Mack Guise, MD  [facility]  [facility] [address], [place] [address] Phone: [phone] FAX: [phone]   CC: Biagio Borg, [place] [address] Phone: [phone]  Fax: [phone]    Return to Endocrinology clinic as below: Future Appointments  Date Time  Provider [place]  11/28/2021  3:00 PM Josee Speece, Melanie Crazier, MD [facility] None  11/30/2021  8:00 AM Biagio Borg, MD [facility] None

## 2021-11-29 DIAGNOSIS — N186 End stage renal disease: Secondary | ICD-10-CM | POA: Diagnosis not present

## 2021-11-29 DIAGNOSIS — N2581 Secondary hyperparathyroidism of renal origin: Secondary | ICD-10-CM | POA: Diagnosis not present

## 2021-11-29 DIAGNOSIS — Z992 Dependence on renal dialysis: Secondary | ICD-10-CM | POA: Diagnosis not present

## 2021-11-30 ENCOUNTER — Ambulatory Visit (INDEPENDENT_AMBULATORY_CARE_PROVIDER_SITE_OTHER): Payer: Federal, State, Local not specified - PPO | Admitting: Internal Medicine

## 2021-11-30 ENCOUNTER — Encounter: Payer: Self-pay | Admitting: Internal Medicine

## 2021-11-30 VITALS — BP 122/70 | HR 92 | Temp 97.9°F | Ht 66.0 in | Wt 213.0 lb

## 2021-11-30 DIAGNOSIS — R258 Other abnormal involuntary movements: Secondary | ICD-10-CM

## 2021-11-30 DIAGNOSIS — Z0001 Encounter for general adult medical examination with abnormal findings: Secondary | ICD-10-CM

## 2021-11-30 DIAGNOSIS — E538 Deficiency of other specified B group vitamins: Secondary | ICD-10-CM | POA: Diagnosis not present

## 2021-11-30 DIAGNOSIS — Z992 Dependence on renal dialysis: Secondary | ICD-10-CM | POA: Diagnosis not present

## 2021-11-30 DIAGNOSIS — E7800 Pure hypercholesterolemia, unspecified: Secondary | ICD-10-CM

## 2021-11-30 DIAGNOSIS — F32A Depression, unspecified: Secondary | ICD-10-CM

## 2021-11-30 DIAGNOSIS — E559 Vitamin D deficiency, unspecified: Secondary | ICD-10-CM | POA: Diagnosis not present

## 2021-11-30 DIAGNOSIS — N2581 Secondary hyperparathyroidism of renal origin: Secondary | ICD-10-CM | POA: Diagnosis not present

## 2021-11-30 DIAGNOSIS — N186 End stage renal disease: Secondary | ICD-10-CM | POA: Diagnosis not present

## 2021-11-30 DIAGNOSIS — M542 Cervicalgia: Secondary | ICD-10-CM | POA: Diagnosis not present

## 2021-11-30 DIAGNOSIS — E1022 Type 1 diabetes mellitus with diabetic chronic kidney disease: Secondary | ICD-10-CM

## 2021-11-30 DIAGNOSIS — N185 Chronic kidney disease, stage 5: Secondary | ICD-10-CM

## 2021-11-30 NOTE — Assessment & Plan Note (Signed)
Lab Results  ?Component Value Date  ? HGBA1C 9.3 (A) 07/25/2021  ? ?Uncontrolled, to rescheuld with endo,, pt to continue current medical treatment novolog, lantus;  No longer taking ozempic due to intolerance ? ?

## 2021-11-30 NOTE — Progress Notes (Signed)
Patient ID: Hector Mahoney, male   DOB: 1971/01/25, 51 y.o.   MRN: [id number] ? ? ? ?     Chief Complaint:: wellness exam and Follow-up ? Depression, bilateral shoulder involuntary movements, posterior neck pain, hld ? ?     HPI:  Hector Mahoney is a 51 y.o. male here for wellness exam; due for eye exam, declines covid booster and hep c screen, o/w up to date ?         ?              Also now back on PD, but takes much time.  Missed endo appt 2 days ago, went to wrong building, plans to reschedule.  Took ozempic last monday but did not tolerate with nause and GI upset. Has had mild worsening depressive symptoms, but no suicidal ideation, or panic; has ongoing anxiety.  Also menitons large shoulder movements involuntary only at night for the last several months.  Also has had post neck pain for 1 wk only, without clear radicular symptoms or extremity weakness.  Trying to follow lower chol diet. Pt denies chest pain, increased sob or doe, wheezing, orthopnea, PND, increased LE swelling, palpitations, dizziness or syncope.   Pt denies polydipsia, polyuria, or other new focal neuro s/s.    Pt denies fever, wt loss, night sweats, loss of appetite, or other constitutional symptoms   Has been out of statin for 1 wk, now willing to restart ?  ?Wt Readings from Last 3 Encounters:  ?11/30/21 213 lb (96.6 kg)  ?11/23/21 211 lb (95.7 kg)  ?09/27/21 215 lb 12.8 oz (97.9 kg)  ? ?BP Readings from Last 3 Encounters:  ?11/30/21 122/70  ?11/23/21 (!) 175/113  ?09/27/21 (!) 152/90  ? ?Immunization History  ?Administered Date(s) Administered  ? Hepatitis B, adult 01/16/2018, 02/17/2018, 05/21/2018, 07/14/2018  ? Influenza,inj,Quad PF,6+ Mos 07/31/2019  ? Influenza-Unspecified 06/16/2019, 05/06/2020  ? PFIZER(Purple Top)SARS-COV-2 Vaccination 09/08/2019, 09/30/2019, 06/16/2020  ? Pneumococcal Conjugate-13 11/13/2019  ? Pneumococcal Polysaccharide-23 11/08/2011, 01/13/2020  ? Td 08/15/1995  ? Tetanus 08/15/1995  ? ?Health  Maintenance Due  ?Topic Date Due  ? OPHTHALMOLOGY EXAM  05/13/2020  ? ?  ? ?Past Medical History:  ?Diagnosis Date  ? Anemia   ? CHF (congestive heart failure) ([place])   ? Esophageal reflux 04/27/2009  ? ESRD (end stage renal disease) on dialysis [facility])   ? home HD 4x/week  ? HYPERLIPIDEMIA 04/24/2007  ? HYPERTENSION 04/24/2007  ? Insulin dependent type 1 diabetes mellitus ([place])   ? ?Past Surgical History:  ?Procedure Laterality Date  ? AV FISTULA PLACEMENT Left 06/04/2019  ? Procedure: BRACHIAL-CEPHALIC ARTERIOVENOUS (AV) FISTULA CREATION LEFT ARM;  Surgeon: Rosetta Posner, MD;  Location: [facility];  Service: Vascular;  Laterality: Left;  ? CAPD INSERTION N/A 08/24/2021  ? Procedure: LAPAROSCOPIC INSERTION CONTINUOUS AMBULATORY PERITONEAL DIALYSIS  (CAPD) CATHETER;  Surgeon: Cherre Robins, MD;  Location: [facility] OR;  Service: Vascular;  Laterality: N/A;  ? COLONOSCOPY    ? polyp  ? IR FLUORO GUIDE CV LINE RIGHT  10/22/2017  ? IR THROMBECTOMY AV FISTULA W/THROMBOLYSIS/PTA/STENT INC/SHUNT/IMG LT Left 03/2021  ? IR US GUIDE VASC ACCESS RIGHT  10/22/2017  ? MOUTH SURGERY    ? tooth ext  ? ? reports that he quit smoking about 6 years ago. His smoking use included cigarettes. He has never used smokeless tobacco. He reports that he does not currently use alcohol. He reports that he does not use drugs. ?family history includes Colon  cancer in his maternal uncle; Colon polyps in his maternal uncle; Diabetes in his maternal aunt and mother; Heart disease in his maternal uncle; Hypertension in his mother; Lung cancer in his maternal grandfather. ?No Known Allergies ?Current Outpatient Medications on File Prior to Visit  ?Medication Sig Dispense Refill  ? albuterol (VENTOLIN HFA) 108 (90 Base) MCG/ACT inhaler Inhale 2 puffs into the lungs every 6 (six) hours as needed for wheezing or shortness of breath. 8 g 0  ? amLODipine (NORVASC) 10 MG tablet Take 1 tablet by mouth daily.    ? aspirin 81 MG EC tablet Take by mouth.    ? atorvastatin  (LIPITOR) 80 MG tablet Take 80 mg by mouth daily.    ? AURYXIA 1 GM 210 MG(Fe) tablet Take 420 mg by mouth 3 (three) times daily.    ? calcitRIOL (ROCALTROL) 0.25 MCG capsule Take 1 capsule (0.25 mcg total) by mouth every Monday, Wednesday, and Friday at 6 PM. 15 capsule 0  ? cetirizine (ZYRTEC) 10 MG tablet Take 1 tablet by mouth once daily 30 tablet 0  ? cinacalcet (SENSIPAR) 30 MG tablet Take 60 mg by mouth every evening.    ? Continuous Blood Gluc Sensor (FREESTYLE LIBRE 14 DAY SENSOR) MISC 1 each by Other route daily.    ? dicyclomine (BENTYL) 20 MG tablet Take 1 tablet (20 mg total) by mouth in the morning and at bedtime. 180 tablet 3  ? gabapentin (NEURONTIN) 300 MG capsule TAKE 1 TO 2 CAPSULES BY MOUTH AT BEDTIME AS NEEDED 180 capsule 1  ? HYDROcodone-acetaminophen (NORCO) 5-325 MG tablet Take 1 tablet by mouth every 6 (six) hours as needed for moderate pain. 20 tablet 0  ? insulin aspart (NOVOLOG) 100 UNIT/ML injection INJECT 8 TO 12 UNITS INTO THE SKIN THREE TIMES DAILY WITH MEALS (Patient taking differently: Inject 8-12 Units into the skin 3 (three) times daily with meals.) 10 mL 0  ? insulin glargine (LANTUS) 100 UNIT/ML injection Inject 0.42 mLs (42 Units total) into the skin at bedtime. 45 mL 5  ? levETIRAcetam (KEPPRA) 250 MG tablet Take 1 tablet (250 mg total) by mouth at bedtime. 90 tablet 3  ? losartan (COZAAR) 50 MG tablet Take 1 tablet by mouth daily.    ? meclizine (ANTIVERT) 12.5 MG tablet TAKE 1 TABLET BY MOUTH THREE TIMES DAILY AS NEEDED FOR DIZZINESS (Patient taking differently: Take 12.5 mg by mouth 3 (three) times daily as needed for dizziness.) 40 tablet 0  ? metoprolol succinate (TOPROL-XL) 25 MG 24 hr tablet Take 25 mg by mouth daily.    ? multivitamin (RENA-VIT) TABS tablet Take 1 tablet by mouth daily.    ? NORVASC 10 MG tablet Take 10 mg by mouth at bedtime.    ? Nutritional Supplements (VITAMIN D BOOSTER PO) Take 1 mg by mouth 3 (three) times a week.    ? omeprazole (PRILOSEC) 40 MG  capsule Take 1 capsule (40 mg total) by mouth 2 (two) times daily. 180 capsule 3  ? ondansetron (ZOFRAN) 4 MG tablet Take 1 tablet (4 mg total) by mouth every 8 (eight) hours as needed for nausea or vomiting. 30 tablet 0  ? polyethylene glycol powder (GLYCOLAX/MIRALAX) 17 GM/SCOOP powder Take 1 Container by mouth daily as needed for mild constipation.    ? rOPINIRole (REQUIP) 1 MG tablet Take 1 mg by mouth at bedtime.    ? rosuvastatin (CRESTOR) 10 MG tablet Take 1 tablet (10 mg total) by mouth daily. 30 tablet 3  ?  triamcinolone (NASACORT) 55 MCG/ACT AERO nasal inhaler Place 2 sprays into the nose daily. (Patient taking differently: Place 2 sprays into the nose daily as needed (allergies).) 1 each 0  ? ?No current facility-administered medications on file prior to visit.  ? ?     ROS:  All others reviewed and negative. ? ?Objective  ? ?     PE:  BP 122/70 (BP Location: Right Arm, Patient Position: Sitting, Cuff Size: Large)   Pulse 92   Temp 97.9 ?F (36.6 ?C) (Oral)   Ht '5\' 6"'$  (1.676 m)   Wt 213 lb (96.6 kg)   SpO2 99%   BMI 34.38 kg/m?  ? ?              Constitutional: Pt appears in NAD ?              HENT: Head: NCAT.  ?              Right Ear: External ear normal.   ?              Left Ear: External ear normal.  ?              Eyes: . Pupils are equal, round, and reactive to light. Conjunctivae and EOM are normal ?              Nose: without d/c or deformity ?              Neck: Neck supple. Gross normal ROM ?              Cardiovascular: Normal rate and regular rhythm.   ?              Pulmonary/Chest: Effort normal and breath sounds without rales or wheezing.  ?              Abd:  Soft, NT, ND, + BS, no organomegaly ?              Neurological: Pt is alert. At baseline orientation, motor grossly intact ?              Skin: Skin is warm. No rashes, no other new lesions, LE edema - none ?              Psychiatric: Pt behavior is normal without agitation  ? ?Micro: none ? ?Cardiac tracings I have personally  interpreted today:  none ? ?Pertinent Radiological findings (summarize): none  ? ?Lab Results  ?Component Value Date  ? WBC 11.0 (H) 11/23/2021  ? HGB 11.3 (L) 11/23/2021  ? HCT 35.2 (L) 11/23/2021  ? PLT 299 04/1

## 2021-11-30 NOTE — Patient Instructions (Signed)
Ok to restart the statin for cholesterol ? ?Please continue all other medications as before, and refills have been done if requested. ? ?Please have the pharmacy call with any other refills you may need. ? ?Please continue your efforts at being more active, low cholesterol diet, and weight control. ? ?You are otherwise up to date with prevention measures today. ? ?Please keep your appointments with your specialists as you may have planned ? ?You will be contacted regarding the referral for: Neurology, and counseling ? ?Please go to the XRAY Department in the first floor for the x-ray testing ? ?Please go to the LAB at the blood drawing area for the tests to be done ? ?You will be contacted by phone if any changes need to be made immediately.  Otherwise, you will receive a letter about your results with an explanation, but please check with MyChart first. ? ?Please remember to sign up for MyChart if you have not done so, as this will be important to you in the future with finding out test results, communicating by private email, and scheduling acute appointments online when needed. ? ?Please make an Appointment to return in 6 months, or sooner if needed ?

## 2021-12-01 DIAGNOSIS — Z992 Dependence on renal dialysis: Secondary | ICD-10-CM | POA: Diagnosis not present

## 2021-12-01 DIAGNOSIS — N186 End stage renal disease: Secondary | ICD-10-CM | POA: Diagnosis not present

## 2021-12-01 DIAGNOSIS — N2581 Secondary hyperparathyroidism of renal origin: Secondary | ICD-10-CM | POA: Diagnosis not present

## 2021-12-02 DIAGNOSIS — Z992 Dependence on renal dialysis: Secondary | ICD-10-CM | POA: Diagnosis not present

## 2021-12-02 DIAGNOSIS — N186 End stage renal disease: Secondary | ICD-10-CM | POA: Diagnosis not present

## 2021-12-02 DIAGNOSIS — N2581 Secondary hyperparathyroidism of renal origin: Secondary | ICD-10-CM | POA: Diagnosis not present

## 2021-12-03 ENCOUNTER — Encounter: Payer: Self-pay | Admitting: Internal Medicine

## 2021-12-03 DIAGNOSIS — Z992 Dependence on renal dialysis: Secondary | ICD-10-CM | POA: Diagnosis not present

## 2021-12-03 DIAGNOSIS — N186 End stage renal disease: Secondary | ICD-10-CM | POA: Diagnosis not present

## 2021-12-03 DIAGNOSIS — E785 Hyperlipidemia, unspecified: Secondary | ICD-10-CM | POA: Insufficient documentation

## 2021-12-03 DIAGNOSIS — N2581 Secondary hyperparathyroidism of renal origin: Secondary | ICD-10-CM | POA: Diagnosis not present

## 2021-12-03 NOTE — Assessment & Plan Note (Signed)
Goal ldl < 70,  ?Stable, pt to continue current statin lipitor 80, and check lipids next labs ? ?

## 2021-12-03 NOTE — Assessment & Plan Note (Signed)
Now with several months worsening night time only large joint involuntary jerking moveements - for neurology referral ?

## 2021-12-03 NOTE — Assessment & Plan Note (Signed)
Age and sex appropriate education and counseling updated with regular exercise and diet ?Referrals for preventative services - for optho referral, declines hep c screen ?Immunizations addressed - declines covid booster ?Smoking counseling  - none needed ?Evidence for depression or other mood disorder - mild depression without SI or HI, declines med tx but ok for counseling referral ?Most recent labs reviewed. ?I have personally reviewed and have noted: ?1) the patient's medical and social history ?2) The patient's current medications and supplements ?3) The patient's height, weight, and BMI have been recorded in the chart ? ?

## 2021-12-03 NOTE — Assessment & Plan Note (Signed)
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

## 2021-12-03 NOTE — Assessment & Plan Note (Signed)
Mild, declines ssri, refer counseling ?

## 2021-12-04 DIAGNOSIS — N2581 Secondary hyperparathyroidism of renal origin: Secondary | ICD-10-CM | POA: Diagnosis not present

## 2021-12-04 DIAGNOSIS — N186 End stage renal disease: Secondary | ICD-10-CM | POA: Diagnosis not present

## 2021-12-04 DIAGNOSIS — Z992 Dependence on renal dialysis: Secondary | ICD-10-CM | POA: Diagnosis not present

## 2021-12-05 DIAGNOSIS — Z992 Dependence on renal dialysis: Secondary | ICD-10-CM | POA: Diagnosis not present

## 2021-12-05 DIAGNOSIS — N2581 Secondary hyperparathyroidism of renal origin: Secondary | ICD-10-CM | POA: Diagnosis not present

## 2021-12-05 DIAGNOSIS — N186 End stage renal disease: Secondary | ICD-10-CM | POA: Diagnosis not present

## 2021-12-06 DIAGNOSIS — Z992 Dependence on renal dialysis: Secondary | ICD-10-CM | POA: Diagnosis not present

## 2021-12-06 DIAGNOSIS — N186 End stage renal disease: Secondary | ICD-10-CM | POA: Diagnosis not present

## 2021-12-06 DIAGNOSIS — N2581 Secondary hyperparathyroidism of renal origin: Secondary | ICD-10-CM | POA: Diagnosis not present

## 2021-12-07 DIAGNOSIS — N186 End stage renal disease: Secondary | ICD-10-CM | POA: Diagnosis not present

## 2021-12-07 DIAGNOSIS — Z992 Dependence on renal dialysis: Secondary | ICD-10-CM | POA: Diagnosis not present

## 2021-12-07 DIAGNOSIS — N2581 Secondary hyperparathyroidism of renal origin: Secondary | ICD-10-CM | POA: Diagnosis not present

## 2021-12-07 NOTE — Progress Notes (Signed)
? ?Assessment/Plan:  ? ?1.  Myoclonus ? -Not sure if he is having this or not right now.  If so, this is likely due to gabapentin, in the face of renal failure.  Discussed with patient that renal failure alone can cause myoclonus, but the first thing we should do is stop the gabapentin.  He does not disagree. ? ?2.  RLS ? -reports he was on gabapentin for the RLS ? -hold gabapentin ? -start klonopin, 0.5 mg, 1/2 po q hs.  Discussed risk, benefits, side effects, including potentially addictive nature.  Patient expressed full understanding. ? ?Subjective:  ? ?Hector Mahoney was seen today in follow up for myoclonus.  Patient has not been seen in our clinic since 2021.  This is a patient of Dr. Serita Grit who requested being seen, and Dr. Posey Pronto is currently on leave.  Patient seen in 2021 by Dr. Posey Pronto for the same.  She felt that myoclonic jerks were likely due to electrolyte changes following dialysis and started the patient on Keppra, 250 mg at bedtime, with consideration of a benzodiazepine in the future.  Patient seen by primary care April 20.  Patient currently on peritoneal dialysis.  Notes from PCP states that patient experiencing involuntary jerking, particularly at night.  Patient states today that he thinks that is nearly purposeful to "ease off" the numbness/tingling that he feels.  He has numbness and tingling throughout the entire body.  It goes away with movement and is worse with laying down in bed.  Feels like something is crawling on him - "its an irritant."  Most of it is in bed at night.  He does have a long history of restless leg.  He has been on ropinirole in the past, which was replaced with gabapentin.  He takes anywhere from 100 mg to 300 mg of gabapentin at bedtime, usually 300 mg.  Primary care notes indicate compliance issues.  Recently missed endocrinology appointment.  Has been out of statin medication, but just restarted. ? ? ?CURRENT MEDICATIONS:  ?Outpatient Encounter Medications as  of 12/08/2021  ?Medication Sig  ? amLODipine (NORVASC) 10 MG tablet Take 1 tablet by mouth daily.  ? aspirin 81 MG EC tablet Take by mouth.  ? atorvastatin (LIPITOR) 80 MG tablet Take 80 mg by mouth daily.  ? AURYXIA 1 GM 210 MG(Fe) tablet Take 420 mg by mouth 3 (three) times daily.  ? calcitRIOL (ROCALTROL) 0.25 MCG capsule Take 1 capsule (0.25 mcg total) by mouth every Monday, Wednesday, and Friday at 6 PM.  ? cetirizine (ZYRTEC) 10 MG tablet Take 1 tablet by mouth once daily  ? cinacalcet (SENSIPAR) 30 MG tablet Take 60 mg by mouth every evening.  ? Continuous Blood Gluc Sensor (FREESTYLE LIBRE 14 DAY SENSOR) MISC 1 each by Other route daily.  ? dicyclomine (BENTYL) 20 MG tablet Take 1 tablet (20 mg total) by mouth in the morning and at bedtime.  ? gabapentin (NEURONTIN) 300 MG capsule TAKE 1 TO 2 CAPSULES BY MOUTH AT BEDTIME AS NEEDED  ? HYDROcodone-acetaminophen (NORCO) 5-325 MG tablet Take 1 tablet by mouth every 6 (six) hours as needed for moderate pain.  ? insulin aspart (NOVOLOG) 100 UNIT/ML injection INJECT 8 TO 12 UNITS INTO THE SKIN THREE TIMES DAILY WITH MEALS (Patient taking differently: Inject 8-12 Units into the skin 3 (three) times daily with meals.)  ? insulin glargine (LANTUS) 100 UNIT/ML injection Inject 0.42 mLs (42 Units total) into the skin at bedtime.  ? levETIRAcetam (KEPPRA) 250  MG tablet Take 1 tablet (250 mg total) by mouth at bedtime.  ? losartan (COZAAR) 50 MG tablet Take 1 tablet by mouth daily.  ? meclizine (ANTIVERT) 12.5 MG tablet TAKE 1 TABLET BY MOUTH THREE TIMES DAILY AS NEEDED FOR DIZZINESS (Patient taking differently: Take 12.5 mg by mouth 3 (three) times daily as needed for dizziness.)  ? metoprolol succinate (TOPROL-XL) 25 MG 24 hr tablet Take 25 mg by mouth daily.  ? multivitamin (RENA-VIT) TABS tablet Take 1 tablet by mouth daily.  ? NORVASC 10 MG tablet Take 10 mg by mouth at bedtime.  ? Nutritional Supplements (VITAMIN D BOOSTER PO) Take 1 mg by mouth 3 (three) times a  week.  ? omeprazole (PRILOSEC) 40 MG capsule Take 1 capsule (40 mg total) by mouth 2 (two) times daily.  ? ondansetron (ZOFRAN) 4 MG tablet Take 1 tablet (4 mg total) by mouth every 8 (eight) hours as needed for nausea or vomiting.  ? polyethylene glycol powder (GLYCOLAX/MIRALAX) 17 GM/SCOOP powder Take 1 Container by mouth daily as needed for mild constipation.  ? rOPINIRole (REQUIP) 1 MG tablet Take 1 mg by mouth at bedtime.  ? triamcinolone (NASACORT) 55 MCG/ACT AERO nasal inhaler Place 2 sprays into the nose daily. (Patient taking differently: Place 2 sprays into the nose daily as needed (allergies).)  ? [DISCONTINUED] albuterol (VENTOLIN HFA) 108 (90 Base) MCG/ACT inhaler Inhale 2 puffs into the lungs every 6 (six) hours as needed for wheezing or shortness of breath.  ? [DISCONTINUED] rosuvastatin (CRESTOR) 10 MG tablet Take 1 tablet (10 mg total) by mouth daily. (Patient not taking: Reported on 12/08/2021)  ? ?No facility-administered encounter medications on file as of 12/08/2021.  ? ? ? ?Objective:  ? ?PHYSICAL EXAMINATION:   ? ?VITALS:   ?Vitals:  ? 12/08/21 1439  ?BP: 129/85  ?Pulse: 91  ?SpO2: 99%  ?Weight: 216 lb 9.6 oz (98.2 kg)  ?Height: '5\' 6"'$  (1.676 m)  ? ? ?GEN:  The patient appears stated age and is in NAD. ?HEENT:  Normocephalic, atraumatic.  The mucous membranes are moist. The superficial temporal arteries are without ropiness or tenderness. ?CV:  RRR ?Lungs:  CTAB ?Neck/HEME:  There are no carotid bruits bilaterally. ? ?Neurological examination: ? ?Orientation: The patient is alert and oriented x3. ?Cranial nerves: There is good facial symmetry, with mild decreased nasolabial fold on the right compared to the left (activates symmetrically).The speech is fluent and clear. Soft palate rises symmetrically and there is no tongue deviation. Hearing is intact to conversational tone. ?Sensation: Sensation is intact to light touch throughout ?Motor: Strength is at least antigravity x4. ? ?Movement  examination: ?Tone: There is normal tone in the UE/LE ?Abnormal movements:  no tremor.  No myoclonus.  No asterixis.   ?Coordination:  There is no decremation with RAM's. ?Gait and Station: The patient has no difficulty arising out of a deep-seated chair without the use of the hands. The patient's stride length is good.  He does have difficulty ambulating in a tandem fashion. ? ?  Chemistry   ?   ?Component Value Date/Time  ? NA 135 11/23/2021 1036  ? K 5.2 (H) 11/23/2021 1036  ? CL 87 (L) 11/23/2021 1036  ? CO2 24 11/23/2021 1036  ? BUN 105 (H) 11/23/2021 1036  ? CREATININE 27.76 (H) 11/23/2021 1036  ?    ?Component Value Date/Time  ? CALCIUM 9.3 11/23/2021 1036  ? CALCIUM 7.5 (L) 09/27/2017 1449  ? ALKPHOS 105 11/23/2021 1036  ? AST  13 (L) 11/23/2021 1036  ? ALT 24 11/23/2021 1036  ? BILITOT 0.4 11/23/2021 1036  ?  ? ?Lab Results  ?Component Value Date  ? HGBA1C 9.3 (A) 07/25/2021  ? ?Lab Results  ?Component Value Date  ? TSH 2.03 06/25/2019  ? ? ? ?Total time spent on today's visit was 30 minutes, including both face-to-face time and nonface-to-face time.  Time included that spent on review of records (prior notes available to me/labs/imaging if pertinent), discussing treatment and goals, answering patient's questions and coordinating care. ? ?Cc:  Biagio Borg, MD ? ?

## 2021-12-08 ENCOUNTER — Telehealth: Payer: Self-pay | Admitting: Neurology

## 2021-12-08 ENCOUNTER — Encounter: Payer: Self-pay | Admitting: Neurology

## 2021-12-08 ENCOUNTER — Ambulatory Visit: Payer: Federal, State, Local not specified - PPO | Admitting: Neurology

## 2021-12-08 VITALS — BP 129/85 | HR 91 | Ht 66.0 in | Wt 216.6 lb

## 2021-12-08 DIAGNOSIS — Z992 Dependence on renal dialysis: Secondary | ICD-10-CM | POA: Diagnosis not present

## 2021-12-08 DIAGNOSIS — N186 End stage renal disease: Secondary | ICD-10-CM | POA: Diagnosis not present

## 2021-12-08 DIAGNOSIS — G2581 Restless legs syndrome: Secondary | ICD-10-CM | POA: Diagnosis not present

## 2021-12-08 DIAGNOSIS — N2581 Secondary hyperparathyroidism of renal origin: Secondary | ICD-10-CM | POA: Diagnosis not present

## 2021-12-08 DIAGNOSIS — G253 Myoclonus: Secondary | ICD-10-CM | POA: Diagnosis not present

## 2021-12-08 MED ORDER — CLONAZEPAM 0.5 MG PO TABS: 0.2500 mg | ORAL_TABLET | Freq: Every day | ORAL | 0 refills | Status: DC

## 2021-12-08 NOTE — Patient Instructions (Signed)
STOP gabapentin ?START clonazepam, 0.5 mg, 1/2 tablet at bedtime (or 30 min before hand) ?

## 2021-12-09 DIAGNOSIS — N186 End stage renal disease: Secondary | ICD-10-CM | POA: Diagnosis not present

## 2021-12-09 DIAGNOSIS — Z992 Dependence on renal dialysis: Secondary | ICD-10-CM | POA: Diagnosis not present

## 2021-12-09 DIAGNOSIS — N2581 Secondary hyperparathyroidism of renal origin: Secondary | ICD-10-CM | POA: Diagnosis not present

## 2021-12-10 DIAGNOSIS — N186 End stage renal disease: Secondary | ICD-10-CM | POA: Diagnosis not present

## 2021-12-10 DIAGNOSIS — Z992 Dependence on renal dialysis: Secondary | ICD-10-CM | POA: Diagnosis not present

## 2021-12-10 DIAGNOSIS — N2581 Secondary hyperparathyroidism of renal origin: Secondary | ICD-10-CM | POA: Diagnosis not present

## 2021-12-10 DIAGNOSIS — E1022 Type 1 diabetes mellitus with diabetic chronic kidney disease: Secondary | ICD-10-CM | POA: Diagnosis not present

## 2021-12-11 DIAGNOSIS — N186 End stage renal disease: Secondary | ICD-10-CM | POA: Diagnosis not present

## 2021-12-11 DIAGNOSIS — Z992 Dependence on renal dialysis: Secondary | ICD-10-CM | POA: Diagnosis not present

## 2021-12-11 DIAGNOSIS — N2581 Secondary hyperparathyroidism of renal origin: Secondary | ICD-10-CM | POA: Diagnosis not present

## 2021-12-11 NOTE — Telephone Encounter (Signed)
Pt called in stating he never got the prescription for his Clanozepam. He thought the pharmacy needed more information about it. ?

## 2021-12-11 NOTE — Telephone Encounter (Signed)
[facility] to verify prescription and called patient to let him know they are filling  now  ?

## 2021-12-12 DIAGNOSIS — N186 End stage renal disease: Secondary | ICD-10-CM | POA: Diagnosis not present

## 2021-12-12 DIAGNOSIS — N2581 Secondary hyperparathyroidism of renal origin: Secondary | ICD-10-CM | POA: Diagnosis not present

## 2021-12-12 DIAGNOSIS — Z992 Dependence on renal dialysis: Secondary | ICD-10-CM | POA: Diagnosis not present

## 2021-12-13 ENCOUNTER — Encounter: Payer: Self-pay | Admitting: Internal Medicine

## 2021-12-13 ENCOUNTER — Ambulatory Visit (INDEPENDENT_AMBULATORY_CARE_PROVIDER_SITE_OTHER): Payer: Federal, State, Local not specified - PPO | Admitting: Internal Medicine

## 2021-12-13 DIAGNOSIS — J309 Allergic rhinitis, unspecified: Secondary | ICD-10-CM | POA: Diagnosis not present

## 2021-12-13 DIAGNOSIS — N186 End stage renal disease: Secondary | ICD-10-CM | POA: Diagnosis not present

## 2021-12-13 DIAGNOSIS — I10 Essential (primary) hypertension: Secondary | ICD-10-CM

## 2021-12-13 DIAGNOSIS — N2581 Secondary hyperparathyroidism of renal origin: Secondary | ICD-10-CM | POA: Diagnosis not present

## 2021-12-13 DIAGNOSIS — G4710 Hypersomnia, unspecified: Secondary | ICD-10-CM | POA: Diagnosis not present

## 2021-12-13 DIAGNOSIS — Z992 Dependence on renal dialysis: Secondary | ICD-10-CM | POA: Diagnosis not present

## 2021-12-13 NOTE — Patient Instructions (Signed)
You will be contacted regarding the referral for: pulmonary for possible Sleep Apnea ? ?Please consider adding OTC Nasacort for nasal congestion due to allergies ? ?Please continue all other medications as before, and refills have been done if requested. ? ?Please have the pharmacy call with any other refills you may need. ? ?Please continue your efforts at being more active, low cholesterol diet, and weight control. ? ?Please keep your appointments with your specialists as you may have planned ? ? ? ?

## 2021-12-13 NOTE — Assessment & Plan Note (Signed)
Mild to mod, for add OTC nasacort asd,  to f/u any worsening symptoms or concerns ?

## 2021-12-13 NOTE — Assessment & Plan Note (Signed)
Better controlled at home, pt delines other change in tx today ? ?BP Readings from Last 3 Encounters:  ?12/13/21 (!) 162/98  ?12/08/21 129/85  ?11/30/21 122/70  ? ? ?

## 2021-12-13 NOTE — Progress Notes (Signed)
Patient ID: Hector Mahoney, male   DOB: [birth year], 51 y.o.   MRN: [id number] ? ? ? ?    Chief Complaint: follow up  ? ?     HPI:  Hector Mahoney is a 51 y.o. male here with concern about sleep apnea, has been waking up last 2 night with gasping and happened some before that as well; even called EMS one time since it scared him, Wife states his breathing has been irregular, stopped for a second several times, and snores more lately as well.  Also has some possible increased abd girth and believes his PD may not be pulling off all the fluid and may be pushing up on the diaphragm.  Has an appt now to review his PD to see if this aspect can be improved.  Has een sleeping on the incline lately with some improvement he thinks.  Has been taking the otc mucinex nasal spray but wisling to try nasacort for increased allergy symtpoms in the past few wks as well.  Trying to fall asleep abnormally during the day as well  A few wks almost fell to sleep driving home from church.  ?  ? ?Has gained wt.     ?Wt Readings from Last 3 Encounters:  ?12/13/21 217 lb (98.4 kg)  ?12/08/21 216 lb 9.6 oz (98.2 kg)  ?11/30/21 213 lb (96.6 kg)  ? ?BP Readings from Last 3 Encounters:  ?12/13/21 (!) 162/98  ?12/08/21 129/85  ?11/30/21 122/70  ? ?      ?Past Medical History:  ?Diagnosis Date  ? Anemia   ? CHF (congestive heart failure) ([place])   ? Esophageal reflux 04/27/2009  ? ESRD (end stage renal disease) on dialysis [facility])   ? home HD 4x/week  ? HYPERLIPIDEMIA 04/24/2007  ? HYPERTENSION 04/24/2007  ? Insulin dependent type 1 diabetes mellitus ([place])   ? ?Past Surgical History:  ?Procedure Laterality Date  ? AV FISTULA PLACEMENT Left 06/04/2019  ? Procedure: BRACHIAL-CEPHALIC ARTERIOVENOUS (AV) FISTULA CREATION LEFT ARM;  Surgeon: Rosetta Posner, MD;  Location: [place];  Service: Vascular;  Laterality: Left;  ? CAPD INSERTION N/A 08/24/2021  ? Procedure: LAPAROSCOPIC INSERTION CONTINUOUS AMBULATORY PERITONEAL DIALYSIS  (CAPD) CATHETER;   Surgeon: Cherre Robins, MD;  [facility];  Service: Vascular;  Laterality: N/A;  ? COLONOSCOPY    ? polyp  ? IR FLUORO GUIDE CV LINE RIGHT  10/22/2017  ? IR THROMBECTOMY AV FISTULA W/THROMBOLYSIS/PTA/STENT INC/SHUNT/IMG LT Left 03/2021  ? IR US GUIDE VASC ACCESS RIGHT  10/22/2017  ? MOUTH SURGERY    ? tooth ext  ? ? reports that he quit smoking about 6 years ago. His smoking use included cigarettes. He has never used smokeless tobacco. He reports that he does not currently use alcohol. He reports that he does not use drugs. ?family history includes Colon cancer in his maternal uncle; Colon polyps in his maternal uncle; Diabetes in his maternal aunt and mother; Heart disease in his maternal uncle; Hypertension in his mother; Lung cancer in his maternal grandfather. ?No Known Allergies ?Current Outpatient Medications on File Prior to Visit  ?Medication Sig Dispense Refill  ? amLODipine (NORVASC) 10 MG tablet Take 1 tablet by mouth daily.    ? aspirin 81 MG EC tablet Take by mouth.    ? atorvastatin (LIPITOR) 80 MG tablet Take 80 mg by mouth daily.    ? AURYXIA 1 GM 210 MG(Fe) tablet Take 420 mg by mouth 3 (three) times daily.    ?  calcitRIOL (ROCALTROL) 0.25 MCG capsule Take 1 capsule (0.25 mcg total) by mouth every Monday, Wednesday, and Friday at 6 PM. 15 capsule 0  ? cetirizine (ZYRTEC) 10 MG tablet Take 1 tablet by mouth once daily 30 tablet 0  ? cinacalcet (SENSIPAR) 30 MG tablet Take 60 mg by mouth every evening.    ? clonazePAM (KLONOPIN) 0.5 MG tablet TAKE 1/2 (ONE-HALF) TABLET BY MOUTH AT BEDTIME 45 tablet 0  ? Continuous Blood Gluc Sensor (FREESTYLE LIBRE 14 DAY SENSOR) MISC 1 each by Other route daily.    ? dicyclomine (BENTYL) 20 MG tablet Take 1 tablet (20 mg total) by mouth in the morning and at bedtime. 180 tablet 3  ? HYDROcodone-acetaminophen (NORCO) 5-325 MG tablet Take 1 tablet by mouth every 6 (six) hours as needed for moderate pain. 20 tablet 0  ? insulin aspart (NOVOLOG) 100 UNIT/ML  injection INJECT 8 TO 12 UNITS INTO THE SKIN THREE TIMES DAILY WITH MEALS (Patient taking differently: Inject 8-12 Units into the skin 3 (three) times daily with meals.) 10 mL 0  ? insulin glargine (LANTUS) 100 UNIT/ML injection Inject 0.42 mLs (42 Units total) into the skin at bedtime. 45 mL 5  ? levETIRAcetam (KEPPRA) 250 MG tablet Take 1 tablet (250 mg total) by mouth at bedtime. 90 tablet 3  ? losartan (COZAAR) 50 MG tablet Take 1 tablet by mouth daily.    ? meclizine (ANTIVERT) 12.5 MG tablet TAKE 1 TABLET BY MOUTH THREE TIMES DAILY AS NEEDED FOR DIZZINESS (Patient taking differently: Take 12.5 mg by mouth 3 (three) times daily as needed for dizziness.) 40 tablet 0  ? metoprolol succinate (TOPROL-XL) 25 MG 24 hr tablet Take 25 mg by mouth daily.    ? multivitamin (RENA-VIT) TABS tablet Take 1 tablet by mouth daily.    ? NORVASC 10 MG tablet Take 10 mg by mouth at bedtime.    ? Nutritional Supplements (VITAMIN D BOOSTER PO) Take 1 mg by mouth 3 (three) times a week.    ? omeprazole (PRILOSEC) 40 MG capsule Take 1 capsule (40 mg total) by mouth 2 (two) times daily. 180 capsule 3  ? ondansetron (ZOFRAN) 4 MG tablet Take 1 tablet (4 mg total) by mouth every 8 (eight) hours as needed for nausea or vomiting. 30 tablet 0  ? polyethylene glycol powder (GLYCOLAX/MIRALAX) 17 GM/SCOOP powder Take 1 Container by mouth daily as needed for mild constipation.    ? triamcinolone (NASACORT) 55 MCG/ACT AERO nasal inhaler Place 2 sprays into the nose daily. (Patient taking differently: Place 2 sprays into the nose daily as needed (allergies).) 1 each 0  ? ?No current facility-administered medications on file prior to visit.  ? ?     ROS:  All others reviewed and negative. ? ?Objective  ? ?     PE:  BP (!) 162/98 (BP Location: Right Arm, Patient Position: Sitting, Cuff Size: Large)   Pulse (!) 104   Temp 97.7 ?F (36.5 ?C) (Oral)   Ht '5\' 6"'$  (1.676 m)   Wt 217 lb (98.4 kg)   SpO2 95%   BMI 35.02 kg/m?  ? ?               Constitutional: Pt appears in NAD ?              HENT: Head: NCAT.  ?              Right Ear: External ear normal.   ?  Left Ear: External ear normal.  ?              Eyes: . Pupils are equal, round, and reactive to light. Conjunctivae and EOM are normal ?              Nose: without d/c or deformity ?              Neck: Neck supple. Gross normal ROM ?              Cardiovascular: Normal rate and regular rhythm.   ?              Pulmonary/Chest: Effort normal and breath sounds without rales or wheezing.  ?              Abd:  Soft, NT,mild distended with PD fluid, + BS, no organomegaly ?              Neurological: Pt is alert. At baseline orientation, motor grossly intact ?              Skin: Skin is warm. No rashes, no other new lesions, LE edema - none ?              Psychiatric: Pt behavior is normal without agitation  ? ?Micro: none ? ?Cardiac tracings I have personally interpreted today:  none ? ?Pertinent Radiological findings (summarize): none  ? ?Lab Results  ?Component Value Date  ? WBC 11.0 (H) 11/23/2021  ? HGB 11.3 (L) 11/23/2021  ? HCT 35.2 (L) 11/23/2021  ? PLT 299 11/23/2021  ? GLUCOSE 140 (H) 11/23/2021  ? CHOL 183 06/25/2019  ? TRIG 214.0 (H) 06/25/2019  ? HDL 58.40 06/25/2019  ? LDLDIRECT 80.0 06/25/2019  ? ALT 24 11/23/2021  ? AST 13 (L) 11/23/2021  ? NA 135 11/23/2021  ? K 5.2 (H) 11/23/2021  ? CL 87 (L) 11/23/2021  ? CREATININE 27.76 (H) 11/23/2021  ? BUN 105 (H) 11/23/2021  ? CO2 24 11/23/2021  ? TSH 2.03 06/25/2019  ? PSA 1.39 06/25/2019  ? INR 1.05 10/21/2017  ? HGBA1C 9.3 (A) 07/25/2021  ? ?Assessment/Plan:  ?JAN OLANO is a 51 y.o. Black or African American [2] male with  has a past medical history of Anemia, CHF (congestive heart failure) ([place]), Esophageal reflux (04/27/2009), ESRD (end stage renal disease) on dialysis ([place]), HYPERLIPIDEMIA (04/24/2007), HYPERTENSION (04/24/2007), and Insulin dependent type 1 diabetes mellitus ([place]). ? ?Hypersomnolence ?High suspicion for  OSA - for refer pulmonary for eval and tx ? ?Allergic rhinitis ?Mild to mod, for add OTC nasacort asd,  to f/u any worsening symptoms or concerns ? ?Essential hypertension ?Better controlled at home, pt delines other change i

## 2021-12-13 NOTE — Assessment & Plan Note (Signed)
High suspicion for OSA - for refer pulmonary for eval and tx ?

## 2021-12-14 DIAGNOSIS — N2581 Secondary hyperparathyroidism of renal origin: Secondary | ICD-10-CM | POA: Diagnosis not present

## 2021-12-14 DIAGNOSIS — N186 End stage renal disease: Secondary | ICD-10-CM | POA: Diagnosis not present

## 2021-12-14 DIAGNOSIS — Z992 Dependence on renal dialysis: Secondary | ICD-10-CM | POA: Diagnosis not present

## 2021-12-15 DIAGNOSIS — S76012A Strain of muscle, fascia and tendon of left hip, initial encounter: Secondary | ICD-10-CM | POA: Diagnosis not present

## 2021-12-15 DIAGNOSIS — X500XXA Overexertion from strenuous movement or load, initial encounter: Secondary | ICD-10-CM | POA: Diagnosis not present

## 2021-12-15 DIAGNOSIS — N186 End stage renal disease: Secondary | ICD-10-CM | POA: Diagnosis not present

## 2021-12-15 DIAGNOSIS — Z992 Dependence on renal dialysis: Secondary | ICD-10-CM | POA: Diagnosis not present

## 2021-12-15 DIAGNOSIS — N2581 Secondary hyperparathyroidism of renal origin: Secondary | ICD-10-CM | POA: Diagnosis not present

## 2021-12-16 DIAGNOSIS — Z992 Dependence on renal dialysis: Secondary | ICD-10-CM | POA: Diagnosis not present

## 2021-12-16 DIAGNOSIS — N186 End stage renal disease: Secondary | ICD-10-CM | POA: Diagnosis not present

## 2021-12-16 DIAGNOSIS — N2581 Secondary hyperparathyroidism of renal origin: Secondary | ICD-10-CM | POA: Diagnosis not present

## 2021-12-17 DIAGNOSIS — N186 End stage renal disease: Secondary | ICD-10-CM | POA: Diagnosis not present

## 2021-12-17 DIAGNOSIS — N2581 Secondary hyperparathyroidism of renal origin: Secondary | ICD-10-CM | POA: Diagnosis not present

## 2021-12-17 DIAGNOSIS — Z992 Dependence on renal dialysis: Secondary | ICD-10-CM | POA: Diagnosis not present

## 2021-12-18 DIAGNOSIS — N186 End stage renal disease: Secondary | ICD-10-CM | POA: Diagnosis not present

## 2021-12-18 DIAGNOSIS — Z992 Dependence on renal dialysis: Secondary | ICD-10-CM | POA: Diagnosis not present

## 2021-12-18 DIAGNOSIS — N2581 Secondary hyperparathyroidism of renal origin: Secondary | ICD-10-CM | POA: Diagnosis not present

## 2021-12-19 DIAGNOSIS — N186 End stage renal disease: Secondary | ICD-10-CM | POA: Diagnosis not present

## 2021-12-19 DIAGNOSIS — N2581 Secondary hyperparathyroidism of renal origin: Secondary | ICD-10-CM | POA: Diagnosis not present

## 2021-12-19 DIAGNOSIS — Z992 Dependence on renal dialysis: Secondary | ICD-10-CM | POA: Diagnosis not present

## 2021-12-20 ENCOUNTER — Ambulatory Visit: Payer: Federal, State, Local not specified - PPO | Admitting: Internal Medicine

## 2021-12-20 DIAGNOSIS — N186 End stage renal disease: Secondary | ICD-10-CM | POA: Diagnosis not present

## 2021-12-20 DIAGNOSIS — Z992 Dependence on renal dialysis: Secondary | ICD-10-CM | POA: Diagnosis not present

## 2021-12-20 DIAGNOSIS — N2581 Secondary hyperparathyroidism of renal origin: Secondary | ICD-10-CM | POA: Diagnosis not present

## 2021-12-20 NOTE — Progress Notes (Deleted)
Name: Hector Mahoney  MRN/ DOB: [id number], 02-28-1971   Age/ Sex: 51 y.o., male    PCP: Biagio Borg, MD   Reason for Endocrinology Evaluation: Type 2 Diabetes Mellitus     Date of Initial Endocrinology Visit: 07/25/2021    PATIENT IDENTIFIER: Hector Mahoney is a 51 y.o. male with a past medical history of T2DM, HTN and Dyslipidemia . The patient presented for initial endocrinology clinic visit on 07/25/2021 for consultative assistance with his diabetes management.    HPI: [name] was    Diagnosed with DM in 1998  Prior Medications tried/Intolerance: Has been on insulin since his diagnosis.   Hypoglycemia episodes : yes               Symptoms: yes                 Frequency: rare Hemoglobin A1c has ranged from 7.3% in 2019, peaking at 11.4% in 2014.   Dialysis days Monday Wednesday and Friday . He is on two transplant lists    Follows with the [facility]   On his initial visit to our clinic his A1c was 9.3%, he was on Ozempic, Lantus, and NovoLog which we have adjusted all  SUBJECTIVE:   During the last visit (07/25/2021): A1c 9.3%, increase Ozempic, increase Lantus, and continued prandial dose of insulin  Today (12/20/21): [name] is here for follow-up on diabetes management.  He checks his blood sugars *** times daily. The patient has *** had hypoglycemic episodes since the last clinic visit, which typically occur *** x / - most often occuring ***. The patient is *** symptomatic with these episodes, with symptoms of {symptoms; hypoglycemia:[id number]}.    Patient presented to the ED with nausea, weakness, and decreased appetite on 11/23/2021  HOME DIABETES REGIMEN: Lantus 42  units daily  Novolog 12 units 3 times daily before every meal Correction factor : NovoLog (BG -130/25) Ozempic 1 mg weekly   Statin: yes ACE-I/ARB: yes    CONTINUOUS GLUCOSE MONITORING RECORD INTERPRETATION    Dates of Recording: 11/30-12/13/2022  Sensor  description: dexcom   Results statistics:   CGM use % of time 21  Average and SD 250/69  Time in range    14    %  % Time Above 180 49  % Time above 250 37  % Time Below target 0      Glycemic patterns summary: High glucose during the day and night   Hyperglycemic episodes  all day and night, worse post prandial   Hypoglycemic episodes occurred post bolus   Overnight periods: high       DIABETIC COMPLICATIONS: Microvascular complications:  DR ( on monthly injections ), ESRD on HD  Last eye exam: Completed 06/2021  Macrovascular complications:  CHF Denies: CAD, PVD, CVA   PAST HISTORY: Past Medical History:  Past Medical History:  Diagnosis Date   Anemia    CHF (congestive heart failure) ([place])    Esophageal reflux 04/27/2009   ESRD (end stage renal disease) on dialysis ([place])    home HD 4x/week   HYPERLIPIDEMIA 04/24/2007   HYPERTENSION 04/24/2007   Insulin dependent type 1 diabetes mellitus ([place])    Past Surgical History:  Past Surgical History:  Procedure Laterality Date   AV FISTULA PLACEMENT Left 06/04/2019   Procedure: BRACHIAL-CEPHALIC ARTERIOVENOUS (AV) FISTULA CREATION LEFT ARM;  Surgeon: Rosetta Posner, MD;  [facility];  Service: Vascular;  Laterality: Left;   CAPD INSERTION N/A 08/24/2021  Procedure: LAPAROSCOPIC INSERTION CONTINUOUS AMBULATORY PERITONEAL DIALYSIS  (CAPD) CATHETER;  Surgeon: Cherre Robins, MD;  [facility];  Service: Vascular;  Laterality: N/A;   COLONOSCOPY     polyp   IR FLUORO GUIDE CV LINE RIGHT  10/22/2017   IR THROMBECTOMY AV FISTULA W/THROMBOLYSIS/PTA/STENT INC/SHUNT/IMG LT Left 03/2021   IR US GUIDE VASC ACCESS RIGHT  10/22/2017   MOUTH SURGERY     tooth ext    Social History:  reports that he quit smoking about 6 years ago. His smoking use included cigarettes. He has never used smokeless tobacco. He reports that he does not currently use alcohol. He reports that he does not use drugs. Family History:   Family History  Problem Relation Age of Onset   Hypertension Mother    Diabetes Mother    Diabetes Maternal Aunt    Lung cancer Maternal Grandfather    Colon polyps Maternal Uncle    Colon cancer Maternal Uncle    Heart disease Maternal Uncle    Kidney disease Neg Hx    Esophageal cancer Neg Hx    Rectal cancer Neg Hx    Stomach cancer Neg Hx      HOME MEDICATIONS: Allergies as of 12/20/2021   No Known Allergies      Medication List        Accurate as of Dec 20, 2021  7:21 AM. If you have any questions, ask your nurse or doctor.          aspirin 81 MG EC tablet Take by mouth.   atorvastatin 80 MG tablet Commonly known as: LIPITOR Take 80 mg by mouth daily.   Auryxia 1 GM 210 MG(Fe) tablet Generic drug: ferric citrate Take 420 mg by mouth 3 (three) times daily.   calcitRIOL 0.25 MCG capsule Commonly known as: Rocaltrol Take 1 capsule (0.25 mcg total) by mouth every Monday, Wednesday, and Friday at 6 PM.   cetirizine 10 MG tablet Commonly known as: ZYRTEC Take 1 tablet by mouth once daily   cinacalcet 30 MG tablet Commonly known as: SENSIPAR Take 60 mg by mouth every evening.   clonazePAM 0.5 MG tablet Commonly known as: KLONOPIN TAKE 1/2 (ONE-HALF) TABLET BY MOUTH AT BEDTIME   dicyclomine 20 MG tablet Commonly known as: BENTYL Take 1 tablet (20 mg total) by mouth in the morning and at bedtime.   FreeStyle Libre 14 Day Sensor Misc 1 each by Other route daily.   HYDROcodone-acetaminophen 5-325 MG tablet Commonly known as: Norco Take 1 tablet by mouth every 6 (six) hours as needed for moderate pain.   insulin aspart 100 UNIT/ML injection Commonly known as: novoLOG INJECT 8 TO 12 UNITS INTO THE SKIN THREE TIMES DAILY WITH MEALS What changed: See the new instructions.   insulin glargine 100 UNIT/ML injection Commonly known as: Lantus Inject 0.42 mLs (42 Units total) into the skin at bedtime.   levETIRAcetam 250 MG tablet Commonly known as:  Keppra Take 1 tablet (250 mg total) by mouth at bedtime.   losartan 50 MG tablet Commonly known as: COZAAR Take 1 tablet by mouth daily.   meclizine 12.5 MG tablet Commonly known as: ANTIVERT TAKE 1 TABLET BY MOUTH THREE TIMES DAILY AS NEEDED FOR DIZZINESS What changed: See the new instructions.   metoprolol succinate 25 MG 24 hr tablet Commonly known as: TOPROL-XL Take 25 mg by mouth daily.   multivitamin Tabs tablet Take 1 tablet by mouth daily.   Norvasc 10 MG tablet Generic drug: amLODipine  Take 10 mg by mouth at bedtime.   amLODipine 10 MG tablet Commonly known as: NORVASC Take 1 tablet by mouth daily.   omeprazole 40 MG capsule Commonly known as: PRILOSEC Take 1 capsule (40 mg total) by mouth 2 (two) times daily.   ondansetron 4 MG tablet Commonly known as: Zofran Take 1 tablet (4 mg total) by mouth every 8 (eight) hours as needed for nausea or vomiting.   polyethylene glycol powder 17 GM/SCOOP powder Commonly known as: GLYCOLAX/MIRALAX Take 1 Container by mouth daily as needed for mild constipation.   triamcinolone 55 MCG/ACT Aero nasal inhaler Commonly known as: NASACORT Place 2 sprays into the nose daily. What changed:  when to take this reasons to take this   VITAMIN D BOOSTER PO Take 1 mg by mouth 3 (three) times a week.         ALLERGIES: No Known Allergies   REVIEW OF SYSTEMS: A comprehensive ROS was conducted with the patient and is negative except as per HPI and below:  Review of Systems  Gastrointestinal:  Negative for diarrhea, nausea and vomiting.  Neurological:  Positive for tingling.     OBJECTIVE:   VITAL SIGNS: There were no vitals taken for this visit.   PHYSICAL EXAM:  General: Pt appears well and is in NAD  Neck: General: Supple without adenopathy or carotid bruits. Thyroid: Thyroid size normal.  No goiter or nodules appreciated.  Lungs: Clear with good BS bilat with no rales, rhonchi, or wheezes  Heart: RRR with  normal S1 and S2 and no gallops; no murmurs; no rub  Abdomen: Normoactive bowel sounds, soft, nontender, without masses or organomegaly palpable  Extremities:  Lower extremities - No pretibial edema.   Skin: Normal texture and temperature to palpation. No rash noted. No Acanthosis nigricans/skin tags. No lipohypertrophy.  Neuro: MS is good with appropriate affect, pt is alert and Ox3    DM foot exam: 07/25/2021  The skin of the feet is without sores or ulcerations.Callous formation at the 1st MT head B/L  The pedal pulses are undetectable  The sensation is decreased  to a screening 5.07, 10 gram monofilament on the left     DATA REVIEWED:  Lab Results  Component Value Date   HGBA1C 9.3 (A) 07/25/2021   HGBA1C 7.3 (H) 12/22/2020   HGBA1C 9.3 (H) 06/25/2019   Lab Results  Component Value Date   CREATININE 27.76 (H) 11/23/2021   No results found for: [facility]  Lab Results  Component Value Date   CHOL 183 06/25/2019   HDL 58.40 06/25/2019   LDLDIRECT 80.0 06/25/2019   TRIG 214.0 (H) 06/25/2019   CHOLHDL 3 06/25/2019        ASSESSMENT / PLAN / RECOMMENDATIONS:   1) Type 2 Diabetes Mellitus, Poorly controlled, With neuropathic, retinopathic  complications and ESRD on HD  - Most recent A1c of 9.3 %. Goal A1c < 7.0 %.    Plan: GENERAL: I have discussed with the patient the pathophysiology of diabetes. We went over the natural progression of the disease. We talked about both insulin resistance and insulin deficiency. We stressed the importance of lifestyle changes including diet and exercise. I explained the complications associated with diabetes including retinopathy, nephropathy, neuropathy as well as increased risk of cardiovascular disease. We went over the benefit seen with glycemic control.   I explained to the patient that diabetic patients are at higher than normal risk for amputations. Unfortunately, he already has complication but he is on the  transplant list and I  have encouraged him to improve glycemic control  Will increase Ozempic  Will adjust his insulin as below  He is not sure if the insulin pump is for him or not, I have asked him to look up the Omnipod 5  He has dietary indiscretions with snacks , we used this as a teaching moment  He gets his insulin from the [place]   MEDICATIONS: - Increase Ozempic 1 mg weekly  - Increase Lantus to 42 units daily  - Novolog 12 units with each meal  Correction scale : Novolog (BG -130/25)   EDUCATION / INSTRUCTIONS: BG monitoring instructions: Patient is instructed to check his blood sugars 3 times a day, before meals . Call [facility] if: BG persistently < 70  I reviewed the Rule of 15 for the treatment of hypoglycemia in detail with the patient. Literature supplied.   2) Diabetic complications:  Eye: Does  have known diabetic retinopathy.  Neuro/ Feet: Does  have known diabetic peripheral neuropathy. Renal: Patient does  have known baseline CKD. He is  on an ACEI/ARB at present.   3) Decrease [place] Pedis Pulses :   - He has tingling and discomfort at night. He has an upcoming appointment with neurology  - Will proceed with ABI    4) Dyslipidemia    - LDL at goal  - Despite statin therapy has not shows to improve mortality in Hemodialysis pt but I would like to keep him on it due to future of renal transplant.   Continue atorvastatin 80 mg daily   F/U in 4 months            Signed electronically by: Mack Guise, MD  [facility]  [address] [place], [place] [address] Phone: [phone] FAX: [phone]   CC: [address]ag[address], [place] [address] Phone: [phone]  Fax: [phone]    Return to Endocrinology clinic as below: Future Appointments  Date Time Provider [place]  12/20/2021 10:10 AM Nate Perri, Melanie Crazier, MD [facility] None  06/01/2022   8:40 AM Biagio Borg, MD [facility] None  07/23/2022  1:30 PM Narda Amber K, DO [facility] None

## 2021-12-21 DIAGNOSIS — N186 End stage renal disease: Secondary | ICD-10-CM | POA: Diagnosis not present

## 2021-12-21 DIAGNOSIS — N2581 Secondary hyperparathyroidism of renal origin: Secondary | ICD-10-CM | POA: Diagnosis not present

## 2021-12-21 DIAGNOSIS — Z992 Dependence on renal dialysis: Secondary | ICD-10-CM | POA: Diagnosis not present

## 2021-12-22 DIAGNOSIS — Z992 Dependence on renal dialysis: Secondary | ICD-10-CM | POA: Diagnosis not present

## 2021-12-22 DIAGNOSIS — N2581 Secondary hyperparathyroidism of renal origin: Secondary | ICD-10-CM | POA: Diagnosis not present

## 2021-12-22 DIAGNOSIS — N186 End stage renal disease: Secondary | ICD-10-CM | POA: Diagnosis not present

## 2021-12-23 DIAGNOSIS — Z992 Dependence on renal dialysis: Secondary | ICD-10-CM | POA: Diagnosis not present

## 2021-12-23 DIAGNOSIS — N186 End stage renal disease: Secondary | ICD-10-CM | POA: Diagnosis not present

## 2021-12-23 DIAGNOSIS — N2581 Secondary hyperparathyroidism of renal origin: Secondary | ICD-10-CM | POA: Diagnosis not present

## 2021-12-25 DIAGNOSIS — N186 End stage renal disease: Secondary | ICD-10-CM | POA: Diagnosis not present

## 2021-12-25 DIAGNOSIS — Z992 Dependence on renal dialysis: Secondary | ICD-10-CM | POA: Diagnosis not present

## 2021-12-25 DIAGNOSIS — Z01810 Encounter for preprocedural cardiovascular examination: Secondary | ICD-10-CM | POA: Diagnosis not present

## 2021-12-25 DIAGNOSIS — Z01818 Encounter for other preprocedural examination: Secondary | ICD-10-CM | POA: Diagnosis not present

## 2021-12-25 DIAGNOSIS — I120 Hypertensive chronic kidney disease with stage 5 chronic kidney disease or end stage renal disease: Secondary | ICD-10-CM | POA: Diagnosis not present

## 2021-12-25 DIAGNOSIS — E1122 Type 2 diabetes mellitus with diabetic chronic kidney disease: Secondary | ICD-10-CM | POA: Diagnosis not present

## 2021-12-26 DIAGNOSIS — N186 End stage renal disease: Secondary | ICD-10-CM | POA: Diagnosis not present

## 2021-12-26 DIAGNOSIS — E11319 Type 2 diabetes mellitus with unspecified diabetic retinopathy without macular edema: Secondary | ICD-10-CM | POA: Diagnosis not present

## 2021-12-26 DIAGNOSIS — I120 Hypertensive chronic kidney disease with stage 5 chronic kidney disease or end stage renal disease: Secondary | ICD-10-CM | POA: Diagnosis not present

## 2021-12-26 DIAGNOSIS — E1122 Type 2 diabetes mellitus with diabetic chronic kidney disease: Secondary | ICD-10-CM | POA: Diagnosis not present

## 2021-12-27 DIAGNOSIS — E1122 Type 2 diabetes mellitus with diabetic chronic kidney disease: Secondary | ICD-10-CM | POA: Diagnosis not present

## 2021-12-27 DIAGNOSIS — N186 End stage renal disease: Secondary | ICD-10-CM | POA: Diagnosis not present

## 2021-12-27 DIAGNOSIS — I120 Hypertensive chronic kidney disease with stage 5 chronic kidney disease or end stage renal disease: Secondary | ICD-10-CM | POA: Diagnosis not present

## 2021-12-27 DIAGNOSIS — E11319 Type 2 diabetes mellitus with unspecified diabetic retinopathy without macular edema: Secondary | ICD-10-CM | POA: Diagnosis not present

## 2021-12-28 DIAGNOSIS — Z992 Dependence on renal dialysis: Secondary | ICD-10-CM | POA: Diagnosis not present

## 2021-12-28 DIAGNOSIS — N186 End stage renal disease: Secondary | ICD-10-CM | POA: Diagnosis not present

## 2021-12-29 DIAGNOSIS — N186 End stage renal disease: Secondary | ICD-10-CM | POA: Diagnosis not present

## 2021-12-29 DIAGNOSIS — I120 Hypertensive chronic kidney disease with stage 5 chronic kidney disease or end stage renal disease: Secondary | ICD-10-CM | POA: Diagnosis not present

## 2021-12-29 DIAGNOSIS — E878 Other disorders of electrolyte and fluid balance, not elsewhere classified: Secondary | ICD-10-CM | POA: Diagnosis not present

## 2021-12-29 DIAGNOSIS — I959 Hypotension, unspecified: Secondary | ICD-10-CM | POA: Diagnosis not present

## 2021-12-29 DIAGNOSIS — D84821 Immunodeficiency due to drugs: Secondary | ICD-10-CM | POA: Diagnosis not present

## 2021-12-29 DIAGNOSIS — Z9483 Pancreas transplant status: Secondary | ICD-10-CM | POA: Diagnosis not present

## 2021-12-29 DIAGNOSIS — K5909 Other constipation: Secondary | ICD-10-CM | POA: Diagnosis not present

## 2021-12-29 DIAGNOSIS — Z992 Dependence on renal dialysis: Secondary | ICD-10-CM | POA: Diagnosis not present

## 2021-12-29 DIAGNOSIS — E8729 Other acidosis: Secondary | ICD-10-CM | POA: Diagnosis not present

## 2021-12-29 DIAGNOSIS — K219 Gastro-esophageal reflux disease without esophagitis: Secondary | ICD-10-CM | POA: Diagnosis not present

## 2021-12-29 DIAGNOSIS — E870 Hyperosmolality and hypernatremia: Secondary | ICD-10-CM | POA: Diagnosis not present

## 2021-12-29 DIAGNOSIS — D696 Thrombocytopenia, unspecified: Secondary | ICD-10-CM | POA: Diagnosis not present

## 2021-12-29 DIAGNOSIS — Z940 Kidney transplant status: Secondary | ICD-10-CM | POA: Diagnosis not present

## 2021-12-29 DIAGNOSIS — D6832 Hemorrhagic disorder due to extrinsic circulating anticoagulants: Secondary | ICD-10-CM | POA: Diagnosis not present

## 2021-12-29 DIAGNOSIS — E1122 Type 2 diabetes mellitus with diabetic chronic kidney disease: Secondary | ICD-10-CM | POA: Diagnosis not present

## 2021-12-29 DIAGNOSIS — D631 Anemia in chronic kidney disease: Secondary | ICD-10-CM | POA: Diagnosis not present

## 2021-12-29 DIAGNOSIS — E1165 Type 2 diabetes mellitus with hyperglycemia: Secondary | ICD-10-CM | POA: Diagnosis not present

## 2021-12-31 DIAGNOSIS — K6389 Other specified diseases of intestine: Secondary | ICD-10-CM | POA: Diagnosis not present

## 2022-01-01 DIAGNOSIS — E1122 Type 2 diabetes mellitus with diabetic chronic kidney disease: Secondary | ICD-10-CM | POA: Diagnosis not present

## 2022-01-01 DIAGNOSIS — N186 End stage renal disease: Secondary | ICD-10-CM | POA: Diagnosis not present

## 2022-01-01 DIAGNOSIS — I120 Hypertensive chronic kidney disease with stage 5 chronic kidney disease or end stage renal disease: Secondary | ICD-10-CM | POA: Diagnosis not present

## 2022-01-01 DIAGNOSIS — Z940 Kidney transplant status: Secondary | ICD-10-CM | POA: Diagnosis not present

## 2022-01-02 DIAGNOSIS — I120 Hypertensive chronic kidney disease with stage 5 chronic kidney disease or end stage renal disease: Secondary | ICD-10-CM | POA: Diagnosis not present

## 2022-01-02 DIAGNOSIS — E1122 Type 2 diabetes mellitus with diabetic chronic kidney disease: Secondary | ICD-10-CM | POA: Diagnosis not present

## 2022-01-02 DIAGNOSIS — Z940 Kidney transplant status: Secondary | ICD-10-CM | POA: Diagnosis not present

## 2022-01-02 DIAGNOSIS — N186 End stage renal disease: Secondary | ICD-10-CM | POA: Diagnosis not present

## 2022-01-03 DIAGNOSIS — I120 Hypertensive chronic kidney disease with stage 5 chronic kidney disease or end stage renal disease: Secondary | ICD-10-CM | POA: Diagnosis not present

## 2022-01-03 DIAGNOSIS — Z940 Kidney transplant status: Secondary | ICD-10-CM | POA: Diagnosis not present

## 2022-01-03 DIAGNOSIS — E1122 Type 2 diabetes mellitus with diabetic chronic kidney disease: Secondary | ICD-10-CM | POA: Diagnosis not present

## 2022-01-03 DIAGNOSIS — N186 End stage renal disease: Secondary | ICD-10-CM | POA: Diagnosis not present

## 2022-01-04 DIAGNOSIS — N186 End stage renal disease: Secondary | ICD-10-CM | POA: Diagnosis not present

## 2022-01-04 DIAGNOSIS — Z940 Kidney transplant status: Secondary | ICD-10-CM | POA: Diagnosis not present

## 2022-01-04 DIAGNOSIS — E1122 Type 2 diabetes mellitus with diabetic chronic kidney disease: Secondary | ICD-10-CM | POA: Diagnosis not present

## 2022-01-04 DIAGNOSIS — I120 Hypertensive chronic kidney disease with stage 5 chronic kidney disease or end stage renal disease: Secondary | ICD-10-CM | POA: Diagnosis not present

## 2022-01-05 DIAGNOSIS — I120 Hypertensive chronic kidney disease with stage 5 chronic kidney disease or end stage renal disease: Secondary | ICD-10-CM | POA: Diagnosis not present

## 2022-01-05 DIAGNOSIS — E1122 Type 2 diabetes mellitus with diabetic chronic kidney disease: Secondary | ICD-10-CM | POA: Diagnosis not present

## 2022-01-05 DIAGNOSIS — Z940 Kidney transplant status: Secondary | ICD-10-CM | POA: Diagnosis not present

## 2022-01-05 DIAGNOSIS — N186 End stage renal disease: Secondary | ICD-10-CM | POA: Diagnosis not present

## 2022-01-10 DIAGNOSIS — Z9483 Pancreas transplant status: Secondary | ICD-10-CM | POA: Diagnosis not present

## 2022-01-10 DIAGNOSIS — Z79899 Other long term (current) drug therapy: Secondary | ICD-10-CM | POA: Diagnosis not present

## 2022-01-10 DIAGNOSIS — E119 Type 2 diabetes mellitus without complications: Secondary | ICD-10-CM | POA: Diagnosis not present

## 2022-01-10 DIAGNOSIS — Z48288 Encounter for aftercare following multiple organ transplant: Secondary | ICD-10-CM | POA: Diagnosis not present

## 2022-01-10 DIAGNOSIS — Z940 Kidney transplant status: Secondary | ICD-10-CM | POA: Diagnosis not present

## 2022-01-11 DIAGNOSIS — N186 End stage renal disease: Secondary | ICD-10-CM | POA: Diagnosis not present

## 2022-01-11 DIAGNOSIS — Z4822 Encounter for aftercare following kidney transplant: Secondary | ICD-10-CM | POA: Diagnosis not present

## 2022-01-11 DIAGNOSIS — Z79899 Other long term (current) drug therapy: Secondary | ICD-10-CM | POA: Diagnosis not present

## 2022-01-11 DIAGNOSIS — Z466 Encounter for fitting and adjustment of urinary device: Secondary | ICD-10-CM | POA: Diagnosis not present

## 2022-01-11 DIAGNOSIS — Z01812 Encounter for preprocedural laboratory examination: Secondary | ICD-10-CM | POA: Diagnosis not present

## 2022-01-11 DIAGNOSIS — Z940 Kidney transplant status: Secondary | ICD-10-CM | POA: Diagnosis not present

## 2022-01-17 DIAGNOSIS — Z48288 Encounter for aftercare following multiple organ transplant: Secondary | ICD-10-CM | POA: Diagnosis not present

## 2022-01-17 DIAGNOSIS — Z940 Kidney transplant status: Secondary | ICD-10-CM | POA: Diagnosis not present

## 2022-01-17 DIAGNOSIS — Z9483 Pancreas transplant status: Secondary | ICD-10-CM | POA: Diagnosis not present

## 2022-01-18 DIAGNOSIS — Z940 Kidney transplant status: Secondary | ICD-10-CM | POA: Diagnosis not present

## 2022-01-18 DIAGNOSIS — D849 Immunodeficiency, unspecified: Secondary | ICD-10-CM | POA: Diagnosis not present

## 2022-01-18 DIAGNOSIS — I10 Essential (primary) hypertension: Secondary | ICD-10-CM | POA: Diagnosis not present

## 2022-01-22 DIAGNOSIS — Z940 Kidney transplant status: Secondary | ICD-10-CM | POA: Diagnosis not present

## 2022-01-22 DIAGNOSIS — Z48288 Encounter for aftercare following multiple organ transplant: Secondary | ICD-10-CM | POA: Diagnosis not present

## 2022-01-22 DIAGNOSIS — Z9483 Pancreas transplant status: Secondary | ICD-10-CM | POA: Diagnosis not present

## 2022-01-23 DIAGNOSIS — Z79899 Other long term (current) drug therapy: Secondary | ICD-10-CM | POA: Diagnosis not present

## 2022-01-23 DIAGNOSIS — Z940 Kidney transplant status: Secondary | ICD-10-CM | POA: Diagnosis not present

## 2022-01-23 DIAGNOSIS — Z9483 Pancreas transplant status: Secondary | ICD-10-CM | POA: Diagnosis not present

## 2022-01-23 DIAGNOSIS — N390 Urinary tract infection, site not specified: Secondary | ICD-10-CM | POA: Diagnosis not present

## 2022-01-23 DIAGNOSIS — Z48288 Encounter for aftercare following multiple organ transplant: Secondary | ICD-10-CM | POA: Diagnosis not present

## 2022-01-31 DIAGNOSIS — E785 Hyperlipidemia, unspecified: Secondary | ICD-10-CM | POA: Diagnosis not present

## 2022-01-31 DIAGNOSIS — D849 Immunodeficiency, unspecified: Secondary | ICD-10-CM | POA: Diagnosis not present

## 2022-01-31 DIAGNOSIS — Z7952 Long term (current) use of systemic steroids: Secondary | ICD-10-CM | POA: Diagnosis not present

## 2022-01-31 DIAGNOSIS — I10 Essential (primary) hypertension: Secondary | ICD-10-CM | POA: Diagnosis not present

## 2022-01-31 DIAGNOSIS — Z794 Long term (current) use of insulin: Secondary | ICD-10-CM | POA: Diagnosis not present

## 2022-01-31 DIAGNOSIS — Z79899 Other long term (current) drug therapy: Secondary | ICD-10-CM | POA: Diagnosis not present

## 2022-01-31 DIAGNOSIS — Z9483 Pancreas transplant status: Secondary | ICD-10-CM | POA: Diagnosis not present

## 2022-01-31 DIAGNOSIS — Z940 Kidney transplant status: Secondary | ICD-10-CM | POA: Diagnosis not present

## 2022-02-08 DIAGNOSIS — Z9483 Pancreas transplant status: Secondary | ICD-10-CM | POA: Diagnosis not present

## 2022-02-08 DIAGNOSIS — I10 Essential (primary) hypertension: Secondary | ICD-10-CM | POA: Diagnosis not present

## 2022-02-08 DIAGNOSIS — E785 Hyperlipidemia, unspecified: Secondary | ICD-10-CM | POA: Diagnosis not present

## 2022-02-08 DIAGNOSIS — Z79899 Other long term (current) drug therapy: Secondary | ICD-10-CM | POA: Diagnosis not present

## 2022-02-08 DIAGNOSIS — Z7952 Long term (current) use of systemic steroids: Secondary | ICD-10-CM | POA: Diagnosis not present

## 2022-02-08 DIAGNOSIS — Z940 Kidney transplant status: Secondary | ICD-10-CM | POA: Diagnosis not present

## 2022-02-08 DIAGNOSIS — D849 Immunodeficiency, unspecified: Secondary | ICD-10-CM | POA: Diagnosis not present

## 2022-02-15 DIAGNOSIS — Z48288 Encounter for aftercare following multiple organ transplant: Secondary | ICD-10-CM | POA: Diagnosis not present

## 2022-02-15 DIAGNOSIS — Z9483 Pancreas transplant status: Secondary | ICD-10-CM | POA: Diagnosis not present

## 2022-02-15 DIAGNOSIS — Z940 Kidney transplant status: Secondary | ICD-10-CM | POA: Diagnosis not present

## 2022-02-22 DIAGNOSIS — Z940 Kidney transplant status: Secondary | ICD-10-CM | POA: Diagnosis not present

## 2022-02-22 DIAGNOSIS — D849 Immunodeficiency, unspecified: Secondary | ICD-10-CM | POA: Diagnosis not present

## 2022-02-22 DIAGNOSIS — E119 Type 2 diabetes mellitus without complications: Secondary | ICD-10-CM | POA: Diagnosis not present

## 2022-02-22 DIAGNOSIS — Z949 Transplanted organ and tissue status, unspecified: Secondary | ICD-10-CM | POA: Diagnosis not present

## 2022-02-22 DIAGNOSIS — E1122 Type 2 diabetes mellitus with diabetic chronic kidney disease: Secondary | ICD-10-CM | POA: Diagnosis not present

## 2022-02-22 DIAGNOSIS — Z9483 Pancreas transplant status: Secondary | ICD-10-CM | POA: Diagnosis not present

## 2022-02-22 DIAGNOSIS — Z794 Long term (current) use of insulin: Secondary | ICD-10-CM | POA: Diagnosis not present

## 2022-03-08 DIAGNOSIS — Z940 Kidney transplant status: Secondary | ICD-10-CM | POA: Diagnosis not present

## 2022-03-08 DIAGNOSIS — E119 Type 2 diabetes mellitus without complications: Secondary | ICD-10-CM | POA: Diagnosis not present

## 2022-03-08 DIAGNOSIS — Z794 Long term (current) use of insulin: Secondary | ICD-10-CM | POA: Diagnosis not present

## 2022-03-08 DIAGNOSIS — Z9483 Pancreas transplant status: Secondary | ICD-10-CM | POA: Diagnosis not present

## 2022-03-20 DIAGNOSIS — Z9483 Pancreas transplant status: Secondary | ICD-10-CM | POA: Diagnosis not present

## 2022-03-20 DIAGNOSIS — Z940 Kidney transplant status: Secondary | ICD-10-CM | POA: Diagnosis not present

## 2022-03-20 DIAGNOSIS — Z48288 Encounter for aftercare following multiple organ transplant: Secondary | ICD-10-CM | POA: Diagnosis not present

## 2022-03-21 ENCOUNTER — Encounter (INDEPENDENT_AMBULATORY_CARE_PROVIDER_SITE_OTHER): Payer: Self-pay

## 2022-04-02 ENCOUNTER — Telehealth: Payer: Self-pay | Admitting: Physician Assistant

## 2022-04-02 MED ORDER — DICYCLOMINE HCL 20 MG PO TABS: 20.0000 mg | ORAL_TABLET | Freq: Two times a day (BID) | ORAL | 1 refills | Status: DC

## 2022-04-02 NOTE — Telephone Encounter (Signed)
Refill sent.

## 2022-04-02 NOTE — Telephone Encounter (Signed)
Patient called requesting a refill on Dicyclomine.

## 2022-04-03 DIAGNOSIS — Z9483 Pancreas transplant status: Secondary | ICD-10-CM | POA: Diagnosis not present

## 2022-04-03 DIAGNOSIS — Z940 Kidney transplant status: Secondary | ICD-10-CM | POA: Diagnosis not present

## 2022-04-03 DIAGNOSIS — Z48288 Encounter for aftercare following multiple organ transplant: Secondary | ICD-10-CM | POA: Diagnosis not present

## 2022-04-17 DIAGNOSIS — D849 Immunodeficiency, unspecified: Secondary | ICD-10-CM | POA: Diagnosis not present

## 2022-04-17 DIAGNOSIS — I10 Essential (primary) hypertension: Secondary | ICD-10-CM | POA: Diagnosis not present

## 2022-04-17 DIAGNOSIS — Z9483 Pancreas transplant status: Secondary | ICD-10-CM | POA: Diagnosis not present

## 2022-04-17 DIAGNOSIS — E119 Type 2 diabetes mellitus without complications: Secondary | ICD-10-CM | POA: Diagnosis not present

## 2022-04-17 DIAGNOSIS — Z940 Kidney transplant status: Secondary | ICD-10-CM | POA: Diagnosis not present

## 2022-04-17 DIAGNOSIS — Z794 Long term (current) use of insulin: Secondary | ICD-10-CM | POA: Diagnosis not present

## 2022-04-18 DIAGNOSIS — E103592 Type 1 diabetes mellitus with proliferative diabetic retinopathy without macular edema, left eye: Secondary | ICD-10-CM | POA: Diagnosis not present

## 2022-04-18 DIAGNOSIS — H35033 Hypertensive retinopathy, bilateral: Secondary | ICD-10-CM | POA: Diagnosis not present

## 2022-04-18 DIAGNOSIS — E103511 Type 1 diabetes mellitus with proliferative diabetic retinopathy with macular edema, right eye: Secondary | ICD-10-CM | POA: Diagnosis not present

## 2022-04-18 DIAGNOSIS — H4313 Vitreous hemorrhage, bilateral: Secondary | ICD-10-CM | POA: Diagnosis not present

## 2022-04-30 DIAGNOSIS — E1169 Type 2 diabetes mellitus with other specified complication: Secondary | ICD-10-CM | POA: Diagnosis not present

## 2022-04-30 DIAGNOSIS — Z9483 Pancreas transplant status: Secondary | ICD-10-CM | POA: Diagnosis not present

## 2022-04-30 DIAGNOSIS — Z940 Kidney transplant status: Secondary | ICD-10-CM | POA: Diagnosis not present

## 2022-04-30 DIAGNOSIS — Z7952 Long term (current) use of systemic steroids: Secondary | ICD-10-CM | POA: Diagnosis not present

## 2022-04-30 DIAGNOSIS — Z48288 Encounter for aftercare following multiple organ transplant: Secondary | ICD-10-CM | POA: Diagnosis not present

## 2022-04-30 DIAGNOSIS — Z79899 Other long term (current) drug therapy: Secondary | ICD-10-CM | POA: Diagnosis not present

## 2022-04-30 DIAGNOSIS — Z4822 Encounter for aftercare following kidney transplant: Secondary | ICD-10-CM | POA: Diagnosis not present

## 2022-05-09 NOTE — Progress Notes (Unsigned)
Name: Hector Mahoney  MRN/ DOB: [id number], 12-Sep-1970   Age/ Sex: 51 y.o., male    PCP: Biagio Borg, MD   Reason for Endocrinology Evaluation: Type 2 Diabetes Mellitus     Date of Initial Endocrinology Visit: 07/25/2021    PATIENT IDENTIFIER: Hector Mahoney is a 51 y.o. 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 The patient presented for initial endocrinology clinic visit on 07/25/2021 for consultative assistance with his diabetes management.    HPI: Hector Mahoney was    Diagnosed with DM in 1998  Prior Medications tried/Intolerance: Has been on insulin since his diagnosis.  Hemoglobin A1c has ranged from 7.3% in 2019, peaking at 11.4% in 2014.   Ozempic caused nausea and vomiting  [facility]    SUBJECTIVE:   During the last visit (07/25/2021): A1c 9.3%  Today (05/10/22): Hector Mahoney is here for a follow up on diabetes management.  He checks his  blood sugars 2-3 times daily. 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, patient is on tacrolimus and prednisone  HOME ENDOCRINE  REGIMEN: Lantus 45  units daily  Novolog per ss 25-30 units  Prednisone     Statin: yes ACE-I/ARB: yes     DIABETIC COMPLICATIONS: Microvascular complications:  DR ( on monthly injections ) Last eye exam: Completed 06/2021  Macrovascular complications:  CHF Denies: CAD, PVD, CVA   PAST HISTORY: Past Medical History:  Past Medical History:  Diagnosis Date   Anemia    CHF (congestive heart failure) ([place])    Esophageal reflux 04/27/2009   ESRD (end stage renal disease) on dialysis ([place])    home HD 4x/week   HYPERLIPIDEMIA 04/24/2007   HYPERTENSION 04/24/2007   Insulin dependent type 1 diabetes  mellitus ([place])    Past Surgical History:  Past Surgical History:  Procedure Laterality Date   AV FISTULA PLACEMENT Left 06/04/2019   Procedure: BRACHIAL-CEPHALIC ARTERIOVENOUS (AV) FISTULA CREATION LEFT ARM;  Surgeon: Rosetta Posner, MD;  Location: [facility];  Service: Vascular;  Laterality: Left;   CAPD INSERTION N/A 08/24/2021   Procedure: LAPAROSCOPIC INSERTION CONTINUOUS AMBULATORY PERITONEAL DIALYSIS  (CAPD) CATHETER;  Surgeon: Cherre Robins, MD;  Location: [facility];  Service: Vascular;  Laterality: N/A;   COLONOSCOPY     polyp   IR FLUORO GUIDE CV LINE RIGHT  10/22/2017   IR THROMBECTOMY AV FISTULA W/THROMBOLYSIS/PTA/STENT INC/SHUNT/IMG LT Left 03/2021   IR US GUIDE VASC ACCESS RIGHT  10/22/2017   MOUTH SURGERY     tooth ext    Social History:  reports that he quit smoking about 7 years ago. His smoking use included cigarettes. He has never used smokeless tobacco. He reports that he does not currently use alcohol. He reports that he does not use drugs. Family History:  Family History  Problem Relation Age of Onset   Hypertension Mother    Diabetes Mother    Diabetes Maternal Aunt    Lung cancer Maternal Grandfather    Colon polyps Maternal Uncle    Colon cancer Maternal Uncle    Heart disease Maternal Uncle    Kidney disease Neg Hx    Esophageal cancer Neg Hx    Rectal cancer Neg Hx    Stomach cancer  Neg Hx      HOME MEDICATIONS: Allergies as of 05/10/2022   No Known Allergies      Medication List        Accurate as of May 10, 2022  7:45 AM. If you have any questions, ask your nurse or doctor.          aspirin EC 81 MG tablet Take by mouth.   atorvastatin 80 MG tablet Commonly known as: LIPITOR Take 80 mg by mouth daily.   Auryxia 1 GM 210 MG(Fe) tablet Generic drug: ferric citrate Take 420 mg by mouth 3 (three) times daily.   calcitRIOL 0.25 MCG capsule Commonly known as: Rocaltrol Take 1 capsule (0.25 mcg total) by mouth every Monday, Wednesday,  and Friday at 6 PM.   cetirizine 10 MG tablet Commonly known as: ZYRTEC Take 1 tablet by mouth once daily   cinacalcet 30 MG tablet Commonly known as: SENSIPAR Take 60 mg by mouth every evening.   clonazePAM 0.5 MG tablet Commonly known as: KLONOPIN TAKE 1/2 (ONE-HALF) TABLET BY MOUTH AT BEDTIME   dicyclomine 20 MG tablet Commonly known as: BENTYL Take 1 tablet (20 mg total) by mouth in the morning and at bedtime.   FreeStyle Libre 14 Day Sensor Misc 1 each by Other route daily.   HYDROcodone-acetaminophen 5-325 MG tablet Commonly known as: Norco Take 1 tablet by mouth every 6 (six) hours as needed for moderate pain.   insulin aspart 100 UNIT/ML injection Commonly known as: novoLOG INJECT 8 TO 12 UNITS INTO THE SKIN THREE TIMES DAILY WITH MEALS What changed: See the new instructions.   insulin glargine 100 UNIT/ML injection Commonly known as: Lantus Inject 0.42 mLs (42 Units total) into the skin at bedtime. What changed: how much to take   levETIRAcetam 250 MG tablet Commonly known as: Keppra Take 1 tablet (250 mg total) by mouth at bedtime.   losartan 50 MG tablet Commonly known as: COZAAR Take 1 tablet by mouth daily.   meclizine 12.5 MG tablet Commonly known as: ANTIVERT TAKE 1 TABLET BY MOUTH THREE TIMES DAILY AS NEEDED FOR DIZZINESS What changed: See the new instructions.   metoprolol succinate 25 MG 24 hr tablet Commonly known as: TOPROL-XL Take 25 mg by mouth daily.   multivitamin Tabs tablet Take 1 tablet by mouth daily.   Norvasc 10 MG tablet Generic drug: amLODipine Take 10 mg by mouth at bedtime.   amLODipine 10 MG tablet Commonly known as: NORVASC Take 1 tablet by mouth daily.   omeprazole 40 MG capsule Commonly known as: PRILOSEC Take 1 capsule (40 mg total) by mouth 2 (two) times daily.   ondansetron 4 MG tablet Commonly known as: Zofran Take 1 tablet (4 mg total) by mouth every 8 (eight) hours as needed for nausea or vomiting.    polyethylene glycol powder 17 GM/SCOOP powder Commonly known as: GLYCOLAX/MIRALAX Take 1 Container by mouth daily as needed for mild constipation.   triamcinolone 55 MCG/ACT Aero nasal inhaler Commonly known as: NASACORT Place 2 sprays into the nose daily.         ALLERGIES: No Known Allergies      OBJECTIVE:   VITAL SIGNS: BP (!) 152/88 (BP Location: Left Arm, Patient Position: Sitting, Cuff Size: Normal)   Pulse 94   Wt 214 lb (97.1 kg)   SpO2 94%   BMI 34.54 kg/m    PHYSICAL EXAM:  General: Pt appears well and is in NAD  Neck: General: Supple without adenopathy or carotid  bruits. Thyroid: Thyroid size normal.  No goiter or nodules appreciated.  Lungs: Clear with good BS bilat with no rales, rhonchi, or wheezes  Heart: RRR with normal S1 and S2 and no gallops; no murmurs; no rub  Abdomen: Normoactive bowel sounds, soft, nontender, without masses or organomegaly palpable  Extremities:  Lower extremities - No pretibial edema.   Skin: Normal texture and temperature to palpation. No rash noted. No Acanthosis nigricans/skin tags. No lipohypertrophy.  Neuro: MS is good with appropriate affect, pt is alert and Ox3    DM foot exam: 07/25/2021  The skin of the feet is without sores or ulcerations.Callous formation at the 1st MT head B/L  The pedal pulses are undetectable  The sensation is decreased  to a screening 5.07, 10 gram monofilament on the left     DATA REVIEWED:  Lab Results  Component Value Date   HGBA1C 9.3 (A) 07/25/2021   HGBA1C 7.3 (H) 12/22/2020   HGBA1C 9.3 (H) 06/25/2019   Lab Results  Component Value Date   CREATININE 27.76 (H) 11/23/2021   No results found for: "MICRALBCREAT"  Lab Results  Component Value Date   CHOL 183 06/25/2019   HDL 58.40 06/25/2019   LDLDIRECT 80.0 06/25/2019   TRIG 214.0 (H) 06/25/2019   CHOLHDL 3 06/25/2019       Old records , labs and images have been reviewed (Care Everywhere)  ASSESSMENT / PLAN /  RECOMMENDATIONS:   1) Type 2 Diabetes Mellitus, OPtimally  controlled, With neuropathic, retinopathic complications , S/P kidney /pancreas transplant  - Most recent A1c of 6.0 %. Goal A1c < 7.0 %.     -Patient is s/p pancreatic and renal transplant in May 2023 -He is currently on prednisone and tacrolimus  -He continues to need insulin at this time -Intolerant to [employer] was showing multiple skewed results and he switched to [facility], patient will obtain this through the [place] -He also obtains Insulin through [place]  -There is no glucose data today, but given his A1c of 6.0% and endorsement of hypoglycemia during the day I am going to reduce his prandial dose of insulin and provide him with a correction scale -I would not change his basal insulin as his fasting BG this morning 118 mg/DL   MEDICATIONS:  Continue Lantus 45 units daily  Novolog 22 units with each meal  Correction scale : Novolog (BG -130/20)   EDUCATION / INSTRUCTIONS: BG monitoring instructions: Patient is instructed to check his blood sugars 3 times a day, before meals . Call [facility] if: BG persistently < 70  I reviewed the Rule of 15 for the treatment of hypoglycemia in detail with the patient. Literature supplied.   2) Diabetic complications:  Eye: Does  have known diabetic retinopathy.  Neuro/ Feet: Does  have known diabetic peripheral neuropathy. Renal: Patient does  have known baseline CKD. He is  on an ACEI/ARB at present.     F/U in 4 months            Signed electronically by: Hector Guise, MD  [facility]  [facility] [address] [place], [place] [address] Phone: [phone] FAX: [phone]   CC: Biagio [address], [place] [address] Phone: [phone]  Fax: [phone]    Return to Endocrinology clinic as below: Future Appointments  Date Time Provider [place]   06/01/2022  8:40 AM Biagio Borg, MD [facility] None  07/23/2022  1:30 PM Patel, Donika K, DO [facility] None

## 2022-05-10 ENCOUNTER — Ambulatory Visit (INDEPENDENT_AMBULATORY_CARE_PROVIDER_SITE_OTHER): Payer: Federal, State, Local not specified - PPO | Admitting: Internal Medicine

## 2022-05-10 ENCOUNTER — Encounter: Payer: Self-pay | Admitting: Internal Medicine

## 2022-05-10 VITALS — BP 152/88 | HR 94 | Ht 66.0 in | Wt 214.0 lb

## 2022-05-10 DIAGNOSIS — E113593 Type 2 diabetes mellitus with proliferative diabetic retinopathy without macular edema, bilateral: Secondary | ICD-10-CM | POA: Diagnosis not present

## 2022-05-10 DIAGNOSIS — Z940 Kidney transplant status: Secondary | ICD-10-CM | POA: Insufficient documentation

## 2022-05-10 DIAGNOSIS — E1142 Type 2 diabetes mellitus with diabetic polyneuropathy: Secondary | ICD-10-CM

## 2022-05-10 DIAGNOSIS — Z794 Long term (current) use of insulin: Secondary | ICD-10-CM | POA: Insufficient documentation

## 2022-05-10 DIAGNOSIS — Z9483 Pancreas transplant status: Secondary | ICD-10-CM

## 2022-05-10 LAB — POCT GLYCOSYLATED HEMOGLOBIN (HGB A1C): Hemoglobin A1C: 6 % — AB (ref 4.0–5.6)

## 2022-05-10 LAB — POCT GLUCOSE (DEVICE FOR HOME USE): Glucose Fasting, POC: 118 mg/dL — AB (ref 70–99)

## 2022-05-10 NOTE — Patient Instructions (Signed)
Continue Lantus 45 units daily  Novolog 22 units with each meal  Novolog correctional insulin: ADD extra units on insulin to your meal-time Novolog dose if your blood sugars are higher than 150. Use the scale below to help guide you:   Blood sugar before meal Number of units to inject  Less than 150 0 unit  151 -  170 1 units  171 -  190 2 units  191 -  210 3 units  211 -  230 4 units  231 -  250 5 units  251 -  270 6 units  271 -  290 7 units  291 -  310 8 units  311 - 330 9 units      HOW TO TREAT LOW BLOOD SUGARS (Blood sugar LESS THAN 70 MG/DL) Please follow the RULE OF 15 for the treatment of hypoglycemia treatment (when your (blood sugars are less than 70 mg/dL)   STEP 1: Take 15 grams of carbohydrates when your blood sugar is low, which includes:  3-4 GLUCOSE TABS  OR 3-4 OZ OF JUICE OR REGULAR SODA OR ONE TUBE OF GLUCOSE GEL    STEP 2: RECHECK blood sugar in 15 MINUTES STEP 3: If your blood sugar is still low at the 15 minute recheck --> then, go back to STEP 1 and treat AGAIN with another 15 grams of carbohydrates.

## 2022-05-14 DIAGNOSIS — Z9483 Pancreas transplant status: Secondary | ICD-10-CM | POA: Diagnosis not present

## 2022-05-14 DIAGNOSIS — N186 End stage renal disease: Secondary | ICD-10-CM | POA: Diagnosis not present

## 2022-05-14 DIAGNOSIS — Z940 Kidney transplant status: Secondary | ICD-10-CM | POA: Diagnosis not present

## 2022-05-14 DIAGNOSIS — E1122 Type 2 diabetes mellitus with diabetic chronic kidney disease: Secondary | ICD-10-CM | POA: Diagnosis not present

## 2022-05-14 DIAGNOSIS — Z794 Long term (current) use of insulin: Secondary | ICD-10-CM | POA: Diagnosis not present

## 2022-05-16 ENCOUNTER — Encounter: Payer: Self-pay | Admitting: Internal Medicine

## 2022-05-16 ENCOUNTER — Ambulatory Visit (INDEPENDENT_AMBULATORY_CARE_PROVIDER_SITE_OTHER): Payer: Federal, State, Local not specified - PPO | Admitting: Internal Medicine

## 2022-05-16 VITALS — BP 124/82 | HR 104 | Ht 66.0 in | Wt 214.0 lb

## 2022-05-16 DIAGNOSIS — I10 Essential (primary) hypertension: Secondary | ICD-10-CM | POA: Diagnosis not present

## 2022-05-16 DIAGNOSIS — N185 Chronic kidney disease, stage 5: Secondary | ICD-10-CM

## 2022-05-16 DIAGNOSIS — E1022 Type 1 diabetes mellitus with diabetic chronic kidney disease: Secondary | ICD-10-CM

## 2022-05-16 DIAGNOSIS — E785 Hyperlipidemia, unspecified: Secondary | ICD-10-CM

## 2022-05-16 DIAGNOSIS — M79605 Pain in left leg: Secondary | ICD-10-CM | POA: Diagnosis not present

## 2022-05-16 NOTE — Assessment & Plan Note (Signed)
No results found for: "[place]" Due for lab f/u, only has labs done at [facility] - pt given order, pt to continue current statin lipitor 80 mg

## 2022-05-16 NOTE — Assessment & Plan Note (Signed)
Exam with diminished pulses to distal legs, now with symptoms suspicous for PAD- for arterial ABI

## 2022-05-16 NOTE — Patient Instructions (Addendum)
Ok to consider the mounjaro if the ozempic is not tolerable  You will be contacted regarding the referral for: Leg artery circulation testing  Please continue all other medications as before, and refills have been done if requested.  Please have the pharmacy call with any other refills you may need.  Please keep your appointments with your specialists as you may have planned  Please have your lipid panel, TSH and PSA done at the [facility]  Please make an Appointment to return in 6 months, or sooner if needed,

## 2022-05-16 NOTE — Progress Notes (Signed)
Patient ID: Hector Mahoney, male   DOB: 06-Sep-1970, 51 y.o.   MRN: [id number]        Chief Complaint: follow up HTN, HLD and hyperglycemia, left leg pain       HPI:  Hector Mahoney is a 51 y.o. male here with c/o recurring left leg pain starting at about the medial left knee and radiates to the ankle with ambulation, but only with going uphill.  Pt denies chest pain, increased sob or doe, wheezing, orthopnea, PND, increased LE swelling, palpitations, dizziness or syncope.   Pt denies polydipsia, polyuria, or new focal neuro s/s.     Has ozempic at home but not yet started due to vomiting when he tried the first time.     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  Saw endo with normal a1c 6.0, and insulin reduced.   Has gained wt with taking prednisone, thought diet not the best either.   Had mult labs done in the past wk, declines further labs.   Per advice pt is planning on waitng 1 month to get the flu shot.  Has optho f/u planned for dec 2023.   Wt Readings from Last 3 Encounters:  05/16/22 214 lb (97.1 kg)  05/10/22 214 lb (97.1 kg)  12/13/21 217 lb (98.4 kg)   BP Readings from Last 3 Encounters:  05/16/22 124/82  05/10/22 (!) 152/88  12/13/21 (!) 162/98         Past Medical History:  Diagnosis Date   Anemia    CHF (congestive heart failure) ([facility])    Esophageal reflux 04/27/2009   ESRD (end stage renal disease) on dialysis ([place])    home HD 4x/week   HYPERLIPIDEMIA 04/24/2007   HYPERTENSION 04/24/2007   Insulin dependent type 1 diabetes mellitus ([place])    Past Surgical History:  Procedure Laterality Date   AV FISTULA PLACEMENT Left 06/04/2019   Procedure: BRACHIAL-CEPHALIC ARTERIOVENOUS (AV) FISTULA CREATION LEFT ARM;  Surgeon: Rosetta Posner, MD;  Location: [facility];  Service: Vascular;  Laterality: Left;   CAPD INSERTION N/A 08/24/2021   Procedure: LAPAROSCOPIC INSERTION CONTINUOUS AMBULATORY  PERITONEAL DIALYSIS  (CAPD) CATHETER;  Surgeon: Cherre Robins, MD;  [facility];  Service: Vascular;  Laterality: N/A;   COLONOSCOPY     polyp   IR FLUORO GUIDE CV LINE RIGHT  10/22/2017   IR THROMBECTOMY AV FISTULA W/THROMBOLYSIS/PTA/STENT INC/SHUNT/IMG LT Left 03/2021   IR US GUIDE VASC ACCESS RIGHT  10/22/2017   MOUTH SURGERY     tooth ext    reports that he quit smoking about 7 years ago. His smoking use included cigarettes. He has never used smokeless tobacco. He reports that he does not currently use alcohol. He reports that he does not use drugs. family history includes Colon cancer in his maternal uncle; Colon polyps in his maternal uncle; Diabetes in his maternal aunt and mother; Heart disease in his maternal uncle; Hypertension in his mother; Lung cancer in his maternal grandfather. No Known Allergies Current Outpatient Medications on File Prior to Visit  Medication Sig Dispense Refill   amLODipine (NORVASC) 10 MG tablet Take 1 tablet by mouth daily. (Patient not taking: Reported on 05/10/2022)     aspirin 81 MG EC tablet Take by mouth. (Patient not taking: Reported on 05/10/2022)     atorvastatin (LIPITOR) 80 MG tablet Take 80 mg by mouth daily.     AURYXIA 1 GM  210 MG(Fe) tablet Take 420 mg by mouth 3 (three) times daily. (Patient not taking: Reported on 05/10/2022)     calcitRIOL (ROCALTROL) 0.25 MCG capsule Take 1 capsule (0.25 mcg total) by mouth every Monday, Wednesday, and Friday at 6 PM. (Patient not taking: Reported on 05/10/2022) 15 capsule 0   cetirizine (ZYRTEC) 10 MG tablet Take 1 tablet by mouth once daily 30 tablet 0   cinacalcet (SENSIPAR) 30 MG tablet Take 60 mg by mouth every evening.     clonazePAM (KLONOPIN) 0.5 MG tablet TAKE 1/2 (ONE-HALF) TABLET BY MOUTH AT BEDTIME (Patient not taking: Reported on 05/10/2022) 45 tablet 0   dicyclomine (BENTYL) 20 MG tablet Take 1 tablet (20 mg total) by mouth in the morning and at bedtime. 180 tablet 1    HYDROcodone-acetaminophen (NORCO) 5-325 MG tablet Take 1 tablet by mouth every 6 (six) hours as needed for moderate pain. (Patient not taking: Reported on 05/10/2022) 20 tablet 0   insulin aspart (NOVOLOG) 100 UNIT/ML injection INJECT 8 TO 12 UNITS INTO THE SKIN THREE TIMES DAILY WITH MEALS (Patient taking differently: Inject 30 Units into the skin 3 (three) times daily with meals.) 10 mL 0   insulin glargine (LANTUS) 100 UNIT/ML injection Inject 0.42 mLs (42 Units total) into the skin at bedtime. (Patient taking differently: Inject 45 Units into the skin at bedtime.) 45 mL 5   levETIRAcetam (KEPPRA) 250 MG tablet Take 1 tablet (250 mg total) by mouth at bedtime. 90 tablet 3   losartan (COZAAR) 50 MG tablet Take 1 tablet by mouth daily.     meclizine (ANTIVERT) 12.5 MG tablet TAKE 1 TABLET BY MOUTH THREE TIMES DAILY AS NEEDED FOR DIZZINESS (Patient taking differently: Take 12.5 mg by mouth 3 (three) times daily as needed for dizziness.) 40 tablet 0   metoprolol succinate (TOPROL-XL) 25 MG 24 hr tablet Take 25 mg by mouth daily.     multivitamin (RENA-VIT) TABS tablet Take 1 tablet by mouth daily. (Patient not taking: Reported on 05/10/2022)     NORVASC 10 MG tablet Take 10 mg by mouth at bedtime. (Patient not taking: Reported on 05/10/2022)     omeprazole (PRILOSEC) 40 MG capsule Take 1 capsule (40 mg total) by mouth 2 (two) times daily. 180 capsule 3   ondansetron (ZOFRAN) 4 MG tablet Take 1 tablet (4 mg total) by mouth every 8 (eight) hours as needed for nausea or vomiting. (Patient not taking: Reported on 05/10/2022) 30 tablet 0   polyethylene glycol powder (GLYCOLAX/MIRALAX) 17 GM/SCOOP powder Take 1 Container by mouth daily as needed for mild constipation. (Patient not taking: Reported on 05/10/2022)     triamcinolone (NASACORT) 55 MCG/ACT AERO nasal inhaler Place 2 sprays into the nose daily. (Patient not taking: Reported on 05/10/2022) 1 each 0   No current facility-administered medications on file  prior to visit.        ROS:  All others reviewed and negative.  Objective        PE:  BP 124/82   Pulse (!) 104   Ht '5\' 6"'$  (1.676 m)   Wt 214 lb (97.1 kg)   SpO2 94%   BMI 34.54 kg/m                 Constitutional: Pt appears in NAD               HENT: Head: NCAT.                Right Ear: External  ear normal.                 Left Ear: External ear normal.                Eyes: . Pupils are equal, round, and reactive to light. Conjunctivae and EOM are normal               Nose: without d/c or deformity               Neck: Neck supple. Gross normal ROM               Cardiovascular: Normal rate and regular rhythm.                 Pulmonary/Chest: Effort normal and breath sounds without rales or wheezing.                Abd:  Soft, NT, ND, + BS, no organomegaly               Neurological: Pt is alert. At baseline orientation, motor grossly intact               Skin: Skin is warm. No rashes, no other new lesions, LE edema - none               Psychiatric: Pt behavior is normal without agitation   Micro: none  Cardiac tracings I have personally interpreted today:  none  Pertinent Radiological findings (summarize): none   Lab Results  Component Value Date   WBC 11.0 (H) 11/23/2021   HGB 11.3 (L) 11/23/2021   HCT 35.2 (L) 11/23/2021   PLT 299 11/23/2021   GLUCOSE 140 (H) 11/23/2021   CHOL 183 06/25/2019   TRIG 214.0 (H) 06/25/2019   HDL 58.40 06/25/2019   LDLDIRECT 80.0 06/25/2019   ALT 24 11/23/2021   AST 13 (L) 11/23/2021   NA 135 11/23/2021   K 5.2 (H) 11/23/2021   CL 87 (L) 11/23/2021   CREATININE 27.76 (H) 11/23/2021   BUN 105 (H) 11/23/2021   CO2 24 11/23/2021   TSH 2.03 06/25/2019   PSA 1.39 06/25/2019   INR 1.05 10/21/2017   HGBA1C 6.0 (A) 05/10/2022   Assessment/Plan:  Hector Mahoney is a 51 y.o. Black or African American [2] male with  has a past medical history of Anemia, CHF (congestive heart failure) ([place]), Esophageal reflux (04/27/2009), ESRD (end  stage renal disease) on dialysis ([place]), HYPERLIPIDEMIA (04/24/2007), HYPERTENSION (04/24/2007), and Insulin dependent type 1 diabetes mellitus ([place]).  Type 1 diabetes mellitus ([facility]) Lab Results  Component Value Date   HGBA1C 6.0 (A) 05/10/2022   Excellent recent control, insulin recently reduced, pt to f/u endo as planned  Essential hypertension BP Readings from Last 3 Encounters:  05/16/22 124/82  05/10/22 (!) 152/88  12/13/21 (!) 162/98   Stable, pt to continue medical treatment norvasc 10 mg qd, losartan 50 mg qd   Dyslipidemia No results found for: "[place]" Due for lab f/u, only has labs done at [facility] - pt given order, pt to continue current statin lipitor 80 mg  Left leg pain Exam with diminished pulses to distal legs, now with symptoms suspicous for PAD- for arterial ABI  Followup: Return in about 6 months (around 11/15/2022).  Cathlean Cower, MD 05/16/2022 4:58 PM [facility]

## 2022-05-16 NOTE — Assessment & Plan Note (Signed)
Lab Results  Component Value Date   HGBA1C 6.0 (A) 05/10/2022   Excellent recent control, insulin recently reduced, pt to f/u endo as planned

## 2022-05-16 NOTE — Assessment & Plan Note (Signed)
BP Readings from Last 3 Encounters:  05/16/22 124/82  05/10/22 (!) 152/88  12/13/21 (!) 162/98   Stable, pt to continue medical treatment norvasc 10 mg qd, losartan 50 mg qd

## 2022-05-28 ENCOUNTER — Ambulatory Visit (HOSPITAL_COMMUNITY): Admission: RE | Admit: 2022-05-28 | Payer: Federal, State, Local not specified - PPO | Source: Ambulatory Visit

## 2022-06-01 ENCOUNTER — Encounter: Payer: Self-pay | Admitting: Internal Medicine

## 2022-06-01 ENCOUNTER — Ambulatory Visit: Payer: Federal, State, Local not specified - PPO | Admitting: Internal Medicine

## 2022-06-11 DIAGNOSIS — Z940 Kidney transplant status: Secondary | ICD-10-CM | POA: Diagnosis not present

## 2022-06-11 DIAGNOSIS — E559 Vitamin D deficiency, unspecified: Secondary | ICD-10-CM | POA: Diagnosis not present

## 2022-06-11 DIAGNOSIS — E119 Type 2 diabetes mellitus without complications: Secondary | ICD-10-CM | POA: Diagnosis not present

## 2022-06-11 DIAGNOSIS — Z949 Transplanted organ and tissue status, unspecified: Secondary | ICD-10-CM | POA: Diagnosis not present

## 2022-06-11 DIAGNOSIS — D849 Immunodeficiency, unspecified: Secondary | ICD-10-CM | POA: Diagnosis not present

## 2022-06-11 DIAGNOSIS — E1122 Type 2 diabetes mellitus with diabetic chronic kidney disease: Secondary | ICD-10-CM | POA: Diagnosis not present

## 2022-06-11 DIAGNOSIS — Z9483 Pancreas transplant status: Secondary | ICD-10-CM | POA: Diagnosis not present

## 2022-06-18 ENCOUNTER — Ambulatory Visit (HOSPITAL_COMMUNITY): Admission: RE | Admit: 2022-06-18 | Payer: Federal, State, Local not specified - PPO | Source: Ambulatory Visit

## 2022-06-25 DIAGNOSIS — Z4822 Encounter for aftercare following kidney transplant: Secondary | ICD-10-CM | POA: Diagnosis not present

## 2022-06-25 DIAGNOSIS — Z9483 Pancreas transplant status: Secondary | ICD-10-CM | POA: Diagnosis not present

## 2022-06-25 DIAGNOSIS — Z48288 Encounter for aftercare following multiple organ transplant: Secondary | ICD-10-CM | POA: Diagnosis not present

## 2022-06-25 DIAGNOSIS — Z79899 Other long term (current) drug therapy: Secondary | ICD-10-CM | POA: Diagnosis not present

## 2022-06-25 DIAGNOSIS — Z7952 Long term (current) use of systemic steroids: Secondary | ICD-10-CM | POA: Diagnosis not present

## 2022-06-25 DIAGNOSIS — Z940 Kidney transplant status: Secondary | ICD-10-CM | POA: Diagnosis not present

## 2022-07-09 DIAGNOSIS — B259 Cytomegaloviral disease, unspecified: Secondary | ICD-10-CM | POA: Diagnosis not present

## 2022-07-09 DIAGNOSIS — D849 Immunodeficiency, unspecified: Secondary | ICD-10-CM | POA: Diagnosis not present

## 2022-07-09 DIAGNOSIS — Z940 Kidney transplant status: Secondary | ICD-10-CM | POA: Diagnosis not present

## 2022-07-09 DIAGNOSIS — Z9483 Pancreas transplant status: Secondary | ICD-10-CM | POA: Diagnosis not present

## 2022-07-09 DIAGNOSIS — Z949 Transplanted organ and tissue status, unspecified: Secondary | ICD-10-CM | POA: Diagnosis not present

## 2022-07-13 DIAGNOSIS — Z940 Kidney transplant status: Secondary | ICD-10-CM | POA: Diagnosis not present

## 2022-07-13 DIAGNOSIS — Z9483 Pancreas transplant status: Secondary | ICD-10-CM | POA: Diagnosis not present

## 2022-07-13 DIAGNOSIS — Z48288 Encounter for aftercare following multiple organ transplant: Secondary | ICD-10-CM | POA: Diagnosis not present

## 2022-07-13 DIAGNOSIS — B259 Cytomegaloviral disease, unspecified: Secondary | ICD-10-CM | POA: Diagnosis not present

## 2022-07-23 ENCOUNTER — Ambulatory Visit: Payer: Federal, State, Local not specified - PPO | Admitting: Neurology

## 2022-07-23 ENCOUNTER — Encounter: Payer: Self-pay | Admitting: Neurology

## 2022-07-23 DIAGNOSIS — Z029 Encounter for administrative examinations, unspecified: Secondary | ICD-10-CM

## 2022-07-24 DIAGNOSIS — Z940 Kidney transplant status: Secondary | ICD-10-CM | POA: Diagnosis not present

## 2022-07-24 DIAGNOSIS — B259 Cytomegaloviral disease, unspecified: Secondary | ICD-10-CM | POA: Diagnosis not present

## 2022-07-24 DIAGNOSIS — Z4822 Encounter for aftercare following kidney transplant: Secondary | ICD-10-CM | POA: Diagnosis not present

## 2022-08-02 DIAGNOSIS — Z940 Kidney transplant status: Secondary | ICD-10-CM | POA: Diagnosis not present

## 2022-08-02 DIAGNOSIS — J019 Acute sinusitis, unspecified: Secondary | ICD-10-CM | POA: Diagnosis not present

## 2022-08-08 DIAGNOSIS — Z9483 Pancreas transplant status: Secondary | ICD-10-CM | POA: Diagnosis not present

## 2022-08-08 DIAGNOSIS — Z794 Long term (current) use of insulin: Secondary | ICD-10-CM | POA: Diagnosis not present

## 2022-08-08 DIAGNOSIS — E119 Type 2 diabetes mellitus without complications: Secondary | ICD-10-CM | POA: Diagnosis not present

## 2022-08-08 DIAGNOSIS — I10 Essential (primary) hypertension: Secondary | ICD-10-CM | POA: Diagnosis not present

## 2022-08-08 DIAGNOSIS — Z940 Kidney transplant status: Secondary | ICD-10-CM | POA: Diagnosis not present

## 2022-08-09 DIAGNOSIS — Z20822 Contact with and (suspected) exposure to covid-19: Secondary | ICD-10-CM | POA: Diagnosis not present

## 2022-08-09 DIAGNOSIS — R051 Acute cough: Secondary | ICD-10-CM | POA: Diagnosis not present

## 2022-08-09 DIAGNOSIS — R0981 Nasal congestion: Secondary | ICD-10-CM | POA: Diagnosis not present

## 2022-08-20 DIAGNOSIS — E291 Testicular hypofunction: Secondary | ICD-10-CM | POA: Diagnosis not present

## 2022-08-20 DIAGNOSIS — Z13220 Encounter for screening for lipoid disorders: Secondary | ICD-10-CM | POA: Diagnosis not present

## 2022-08-20 DIAGNOSIS — Z125 Encounter for screening for malignant neoplasm of prostate: Secondary | ICD-10-CM | POA: Diagnosis not present

## 2022-08-20 DIAGNOSIS — E559 Vitamin D deficiency, unspecified: Secondary | ICD-10-CM | POA: Diagnosis not present

## 2022-08-20 DIAGNOSIS — R7309 Other abnormal glucose: Secondary | ICD-10-CM | POA: Diagnosis not present

## 2022-08-20 DIAGNOSIS — Z1329 Encounter for screening for other suspected endocrine disorder: Secondary | ICD-10-CM | POA: Diagnosis not present

## 2022-08-20 DIAGNOSIS — R7989 Other specified abnormal findings of blood chemistry: Secondary | ICD-10-CM | POA: Diagnosis not present

## 2022-08-27 DIAGNOSIS — E559 Vitamin D deficiency, unspecified: Secondary | ICD-10-CM | POA: Diagnosis not present

## 2022-08-27 DIAGNOSIS — E1065 Type 1 diabetes mellitus with hyperglycemia: Secondary | ICD-10-CM | POA: Diagnosis not present

## 2022-08-27 DIAGNOSIS — Z1339 Encounter for screening examination for other mental health and behavioral disorders: Secondary | ICD-10-CM | POA: Diagnosis not present

## 2022-08-27 DIAGNOSIS — Z1331 Encounter for screening for depression: Secondary | ICD-10-CM | POA: Diagnosis not present

## 2022-08-27 DIAGNOSIS — E291 Testicular hypofunction: Secondary | ICD-10-CM | POA: Diagnosis not present

## 2022-08-31 DIAGNOSIS — Z9483 Pancreas transplant status: Secondary | ICD-10-CM | POA: Diagnosis not present

## 2022-08-31 DIAGNOSIS — Z940 Kidney transplant status: Secondary | ICD-10-CM | POA: Diagnosis not present

## 2022-08-31 DIAGNOSIS — I10 Essential (primary) hypertension: Secondary | ICD-10-CM | POA: Diagnosis not present

## 2022-08-31 DIAGNOSIS — D849 Immunodeficiency, unspecified: Secondary | ICD-10-CM | POA: Diagnosis not present

## 2022-09-03 DIAGNOSIS — Z940 Kidney transplant status: Secondary | ICD-10-CM | POA: Diagnosis not present

## 2022-09-03 DIAGNOSIS — Z949 Transplanted organ and tissue status, unspecified: Secondary | ICD-10-CM | POA: Diagnosis not present

## 2022-09-03 DIAGNOSIS — D849 Immunodeficiency, unspecified: Secondary | ICD-10-CM | POA: Diagnosis not present

## 2022-09-03 DIAGNOSIS — E1122 Type 2 diabetes mellitus with diabetic chronic kidney disease: Secondary | ICD-10-CM | POA: Diagnosis not present

## 2022-09-03 DIAGNOSIS — Z794 Long term (current) use of insulin: Secondary | ICD-10-CM | POA: Diagnosis not present

## 2022-09-03 DIAGNOSIS — Z9483 Pancreas transplant status: Secondary | ICD-10-CM | POA: Diagnosis not present

## 2022-09-06 DIAGNOSIS — B349 Viral infection, unspecified: Secondary | ICD-10-CM | POA: Diagnosis not present

## 2022-09-14 DIAGNOSIS — J40 Bronchitis, not specified as acute or chronic: Secondary | ICD-10-CM | POA: Diagnosis not present

## 2022-09-17 DIAGNOSIS — Z4822 Encounter for aftercare following kidney transplant: Secondary | ICD-10-CM | POA: Diagnosis not present

## 2022-09-17 DIAGNOSIS — Z9483 Pancreas transplant status: Secondary | ICD-10-CM | POA: Diagnosis not present

## 2022-09-17 DIAGNOSIS — Z7952 Long term (current) use of systemic steroids: Secondary | ICD-10-CM | POA: Diagnosis not present

## 2022-09-17 DIAGNOSIS — Z940 Kidney transplant status: Secondary | ICD-10-CM | POA: Diagnosis not present

## 2022-09-17 DIAGNOSIS — Z79899 Other long term (current) drug therapy: Secondary | ICD-10-CM | POA: Diagnosis not present

## 2022-09-17 DIAGNOSIS — Z48288 Encounter for aftercare following multiple organ transplant: Secondary | ICD-10-CM | POA: Diagnosis not present

## 2022-09-24 ENCOUNTER — Encounter: Payer: Self-pay | Admitting: Internal Medicine

## 2022-09-24 ENCOUNTER — Ambulatory Visit (INDEPENDENT_AMBULATORY_CARE_PROVIDER_SITE_OTHER): Payer: Federal, State, Local not specified - PPO | Admitting: Internal Medicine

## 2022-09-24 VITALS — BP 126/82 | HR 92 | Ht 66.0 in | Wt 233.0 lb

## 2022-09-24 DIAGNOSIS — Z794 Long term (current) use of insulin: Secondary | ICD-10-CM

## 2022-09-24 DIAGNOSIS — Z9483 Pancreas transplant status: Secondary | ICD-10-CM

## 2022-09-24 DIAGNOSIS — E113593 Type 2 diabetes mellitus with proliferative diabetic retinopathy without macular edema, bilateral: Secondary | ICD-10-CM

## 2022-09-24 DIAGNOSIS — E291 Testicular hypofunction: Secondary | ICD-10-CM | POA: Diagnosis not present

## 2022-09-24 DIAGNOSIS — R7989 Other specified abnormal findings of blood chemistry: Secondary | ICD-10-CM | POA: Diagnosis not present

## 2022-09-24 DIAGNOSIS — Z940 Kidney transplant status: Secondary | ICD-10-CM | POA: Diagnosis not present

## 2022-09-24 DIAGNOSIS — E1142 Type 2 diabetes mellitus with diabetic polyneuropathy: Secondary | ICD-10-CM | POA: Diagnosis not present

## 2022-09-24 DIAGNOSIS — Z6836 Body mass index (BMI) 36.0-36.9, adult: Secondary | ICD-10-CM | POA: Diagnosis not present

## 2022-09-24 LAB — POCT GLYCOSYLATED HEMOGLOBIN (HGB A1C): Hemoglobin A1C: 8.8 % — AB (ref 4.0–5.6)

## 2022-09-24 MED ORDER — INSULIN GLARGINE 100 UNIT/ML ~~LOC~~ SOLN: 25.0000 [IU] | Freq: Every day | SUBCUTANEOUS | 3 refills | Status: DC

## 2022-09-24 NOTE — Progress Notes (Signed)
Name: Hector Mahoney  MRN/ DOB: [id number], 04-Aug-1971   Age/ Sex: 52 y.o., male    PCP: Biagio Borg, MD   Reason for Endocrinology Evaluation: Type 2 Diabetes Mellitus     Date of Initial Endocrinology Visit: 07/25/2021    PATIENT IDENTIFIER: Hector Mahoney is a 52 y.o. 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 The patient presented for initial endocrinology clinic visit on 07/25/2021 for consultative assistance with his diabetes management.    HPI: Hector Mahoney was    Diagnosed with DM in 1998  Prior Medications tried/Intolerance: Has been on insulin since his diagnosis.  Hemoglobin A1c has ranged from 7.3% in 2019, peaking at 11.4% in 2014.   Ozempic caused nausea and vomiting  [facility]    Started on Pioglitazone by transplant team 08/2022  SUBJECTIVE:   During the last visit (05/10/2021): A1c 6.0%     Today (09/24/22): Hector Mahoney is here for a follow up on diabetes management.  He checks his  blood sugars 2-3 times daily. The patient  had hypoglycemic episodes since the last clinic visit  Since his last visit here he is s/p P kidney, pancreatic transplant on 12/28/2021 patient is on tacrolimus and prednisone. Had a f/u 08/2022   He has not been taking lantus in a month  due to hypoglycemia at 52 mg/dL at night   Denies nausea, vomiting or diarrhea   HOME ENDOCRINE  REGIMEN: Lantus 50  units daily - not taking  Novolog 30 units TIDQAC [id number] (BG-130/20) pioglitazone 30 mg daily  Prednisone     Statin: yes ACE-I/ARB: yes     DIABETIC COMPLICATIONS: Microvascular complications:  DR ( on monthly injections ) Last eye exam: Completed 06/2021  Macrovascular complications:  CHF Denies: CAD, PVD, CVA   PAST HISTORY: Past Medical History:  Past Medical History:  Diagnosis Date   Anemia    CHF (congestive heart failure) (HCC)     Esophageal reflux 04/27/2009   ESRD (end stage renal disease) on dialysis ([place])    home HD 4x/week   HYPERLIPIDEMIA 04/24/2007   HYPERTENSION 04/24/2007   Insulin dependent type 1 diabetes mellitus ([place])    Past Surgical History:  Past Surgical History:  Procedure Laterality Date   AV FISTULA PLACEMENT Left 06/04/2019   Procedure: BRACHIAL-CEPHALIC ARTERIOVENOUS (AV) FISTULA CREATION LEFT ARM;  Surgeon: Rosetta Posner, MD;  [facility];  Service: Vascular;  Laterality: Left;   CAPD INSERTION N/A 08/24/2021   Procedure: LAPAROSCOPIC INSERTION CONTINUOUS AMBULATORY PERITONEAL DIALYSIS  (CAPD) CATHETER;  Surgeon: Cherre Robins, MD;  [facility]: [facility];  Service: Vascular;  Laterality: N/A;   COLONOSCOPY     polyp   IR FLUORO GUIDE CV LINE RIGHT  10/22/2017   IR THROMBECTOMY AV FISTULA W/THROMBOLYSIS/PTA/STENT INC/SHUNT/IMG LT Left 03/2021   IR US GUIDE VASC ACCESS RIGHT  10/22/2017   MOUTH SURGERY     tooth ext    Social History:  reports that he quit smoking about 7 years ago. His smoking use included cigarettes. He has never used smokeless tobacco. He reports that he does not currently use alcohol. He reports that he does not use drugs. Family History:  Family History  Problem Relation Age of Onset   Hypertension Mother    Diabetes Mother    Diabetes Maternal Aunt    Lung cancer Maternal Grandfather    Colon polyps Maternal Uncle  Colon cancer Maternal Uncle    Heart disease Maternal Uncle    Kidney disease Neg Hx    Esophageal cancer Neg Hx    Rectal cancer Neg Hx    Stomach cancer Neg Hx      HOME MEDICATIONS: Allergies as of 09/24/2022   No Known Allergies      Medication List        Accurate as of September 24, 2022  8:07 AM. If you have any questions, ask your nurse or doctor.          aspirin EC 81 MG tablet Take by mouth.   atorvastatin 80 MG tablet Commonly known as: LIPITOR Take 80 mg by mouth daily.   Auryxia 1 GM 210 MG(Fe) tablet Generic  drug: ferric citrate Take 420 mg by mouth 3 (three) times daily.   calcitRIOL 0.25 MCG capsule Commonly known as: Rocaltrol Take 1 capsule (0.25 mcg total) by mouth every Monday, Wednesday, and Friday at 6 PM.   cetirizine 10 MG tablet Commonly known as: ZYRTEC Take 1 tablet by mouth once daily   cinacalcet 30 MG tablet Commonly known as: SENSIPAR Take 60 mg by mouth every evening.   clonazePAM 0.5 MG tablet Commonly known as: KLONOPIN TAKE 1/2 (ONE-HALF) TABLET BY MOUTH AT BEDTIME   dicyclomine 20 MG tablet Commonly known as: BENTYL Take 1 tablet (20 mg total) by mouth in the morning and at bedtime.   HYDROcodone-acetaminophen 5-325 MG tablet Commonly known as: Norco Take 1 tablet by mouth every 6 (six) hours as needed for moderate pain.   insulin aspart 100 UNIT/ML injection Commonly known as: novoLOG INJECT 8 TO 12 UNITS INTO THE SKIN THREE TIMES DAILY WITH MEALS What changed: See the new instructions.   insulin glargine 100 UNIT/ML injection Commonly known as: Lantus Inject 0.42 mLs (42 Units total) into the skin at bedtime. What changed: how much to take   levETIRAcetam 250 MG tablet Commonly known as: Keppra Take 1 tablet (250 mg total) by mouth at bedtime.   losartan 50 MG tablet Commonly known as: COZAAR Take 1 tablet by mouth daily.   meclizine 12.5 MG tablet Commonly known as: ANTIVERT TAKE 1 TABLET BY MOUTH THREE TIMES DAILY AS NEEDED FOR DIZZINESS What changed: See the new instructions.   metoprolol succinate 25 MG 24 hr tablet Commonly known as: TOPROL-XL Take 25 mg by mouth daily.   multivitamin Tabs tablet Take 1 tablet by mouth daily.   Norvasc 10 MG tablet Generic drug: amLODipine Take 10 mg by mouth at bedtime.   amLODipine 10 MG tablet Commonly known as: NORVASC Take 1 tablet by mouth daily.   omeprazole 40 MG capsule Commonly known as: PRILOSEC Take 1 capsule (40 mg total) by mouth 2 (two) times daily.   ondansetron 4 MG  tablet Commonly known as: Zofran Take 1 tablet (4 mg total) by mouth every 8 (eight) hours as needed for nausea or vomiting.   polyethylene glycol powder 17 GM/SCOOP powder Commonly known as: GLYCOLAX/MIRALAX Take 1 Container by mouth daily as needed for mild constipation.   triamcinolone 55 MCG/ACT Aero nasal inhaler Commonly known as: NASACORT Place 2 sprays into the nose daily.         ALLERGIES: No Known Allergies      OBJECTIVE:   VITAL SIGNS: BP 126/82 (BP Location: Right Arm, Patient Position: Sitting, Cuff Size: Large)   Pulse 92   Ht 5' 6"$  (1.676 m)   Wt 233 lb (105.7 kg)  SpO2 97%   BMI 37.61 kg/m    PHYSICAL EXAM:  General: Pt appears well and is in NAD  Neck: General: Supple without adenopathy or carotid bruits. Thyroid: Thyroid size normal.   Lungs: Clear with good BS bilat   Heart: RRR   Abdomen: soft, nontender  Extremities:  Lower extremities - No pretibial edema.   Neuro: MS is good with appropriate affect, pt is alert and Ox3    DM foot exam:09/24/2022  The skin of the feet is without sores or ulcerations.Callous formation at the 1st MT head B/L  The pedal pulses are undetectable  The sensation is decreased  to a screening 5.07, 10 gram monofilament on the left     DATA REVIEWED:  Lab Results  Component Value Date   HGBA1C 6.0 (A) 05/10/2022   HGBA1C 9.3 (A) 07/25/2021   HGBA1C 7.3 (H) 12/22/2020   Lab Results  Component Value Date   CREATININE 27.76 (H) 11/23/2021   No results found for: "MICRALBCREAT"  Lab Results  Component Value Date   CHOL 183 06/25/2019   HDL 58.40 06/25/2019   LDLDIRECT 80.0 06/25/2019   TRIG 214.0 (H) 06/25/2019   CHOLHDL 3 06/25/2019         ASSESSMENT / PLAN / RECOMMENDATIONS:   1) Type 2 Diabetes Mellitus,Poorly   controlled, With neuropathic, retinopathic complications , S/P kidney /pancreas transplant  - Most recent A1c of 8.8 %. Goal A1c < 7.0 %.     -Patient is s/p pancreatic and renal  transplant in May 2023 -He is currently on prednisone and tacrolimus  -Intolerant to Ozempic -He also obtains Insulin through [place]  -Due to hypoglycemia, the patient has stopped using basal insulin for approximately a month ago.  We have not been able to download his Dexcom today but I was able to manually look at his app and the patient has been noted with hypoglycemia overnight, I did recommend restarting basal insulin at a smaller dose as below -I will also reduce his prandial dose of insulin, as he has been noted with a quick decrease in his BG's postprandial, he was encouraged to use NovoLog before the meal and use correction scale as needed -Patient was started on pioglitazone through his transplant team, no intolerance issue, will continue  MEDICATIONS:  Continue pioglitazone 30 mg daily  Restart Lantus 25 units daily  Decrease Novolog 26 units with each meal  Continue correction scale : Novolog (BG -130/20)   EDUCATION / INSTRUCTIONS: BG monitoring instructions: Patient is instructed to check his blood sugars 3 times a day, before meals . Call [place] [facility] if: BG persistently < 70  I reviewed the Rule of 15 for the treatment of hypoglycemia in detail with the patient. Literature supplied.   2) Diabetic complications:  Eye: Does  have known diabetic retinopathy.  Neuro/ Feet: Does  have known diabetic peripheral neuropathy. Renal: Patient does  have known baseline CKD. He is  on an ACEI/ARB at present.     F/U in 6 months            Signed electronically by: Mack Guise, MD  [facility] Endocrinology  [facility] [address] [place],  [address] Phone: [phone] FAX: [phone]   CC: Biagio Borg, [place] [address] Phone: [phone]  Fax: [phone]    Return to Endocrinology clinic as below: Future Appointments  Date Time Provider [place]  10/08/2022   3:30 PM  Patel, Donika K, DO [facility] None

## 2022-09-24 NOTE — Patient Instructions (Signed)
Continue Pioglitazone 30 mg , 1 tablet daily  Take  Lantus 25 units daily  Novolog 26 units with each meal  Novolog correctional insulin: ADD extra units on insulin to your meal-time Novolog dose if your blood sugars are higher than 150. Use the scale below to help guide you:   Blood sugar before meal Number of units to inject  Less than 150 0 unit  151 -  170 1 units  171 -  190 2 units  191 -  210 3 units  211 -  230 4 units  231 -  250 5 units  251 -  270 6 units  271 -  290 7 units  291 -  310 8 units  311 - 330 9 units      HOW TO TREAT LOW BLOOD SUGARS (Blood sugar LESS THAN 70 MG/DL) Please follow the RULE OF 15 for the treatment of hypoglycemia treatment (when your (blood sugars are less than 70 mg/dL)   STEP 1: Take 15 grams of carbohydrates when your blood sugar is low, which includes:  3-4 GLUCOSE TABS  OR 3-4 OZ OF JUICE OR REGULAR SODA OR ONE TUBE OF GLUCOSE GEL    STEP 2: RECHECK blood sugar in 15 MINUTES STEP 3: If your blood sugar is still low at the 15 minute recheck --> then, go back to STEP 1 and treat AGAIN with another 15 grams of carbohydrates.

## 2022-10-08 ENCOUNTER — Encounter: Payer: Self-pay | Admitting: Neurology

## 2022-10-08 ENCOUNTER — Ambulatory Visit: Payer: Federal, State, Local not specified - PPO | Admitting: Neurology

## 2022-10-08 DIAGNOSIS — Z029 Encounter for administrative examinations, unspecified: Secondary | ICD-10-CM

## 2022-10-09 DIAGNOSIS — Z794 Long term (current) use of insulin: Secondary | ICD-10-CM | POA: Diagnosis not present

## 2022-10-09 DIAGNOSIS — Z940 Kidney transplant status: Secondary | ICD-10-CM | POA: Diagnosis not present

## 2022-10-09 DIAGNOSIS — E119 Type 2 diabetes mellitus without complications: Secondary | ICD-10-CM | POA: Diagnosis not present

## 2022-10-09 DIAGNOSIS — E1122 Type 2 diabetes mellitus with diabetic chronic kidney disease: Secondary | ICD-10-CM | POA: Diagnosis not present

## 2022-10-09 DIAGNOSIS — Z9483 Pancreas transplant status: Secondary | ICD-10-CM | POA: Diagnosis not present

## 2022-10-09 DIAGNOSIS — Z48288 Encounter for aftercare following multiple organ transplant: Secondary | ICD-10-CM | POA: Diagnosis not present

## 2022-10-15 ENCOUNTER — Telehealth: Payer: Self-pay

## 2022-10-15 NOTE — Telephone Encounter (Signed)
Patient LVM asking to reschedule his last appointment, looking at last appointment notes it says we are dismissing patient but I do not see any documentation. Please advise if we can reschedule this or not.

## 2022-10-15 NOTE — Telephone Encounter (Signed)
Pt called to reschedule an appt, pt has had 3 no shows. Is pt dismissed or can pt be rescheduled?

## 2022-10-16 ENCOUNTER — Encounter: Payer: Self-pay | Admitting: Neurology

## 2022-10-16 NOTE — Telephone Encounter (Signed)
Dismissed for no shows.

## 2022-10-16 NOTE — Telephone Encounter (Signed)
No call back needed to patient, patient will receive dismissal letter for his no shows.

## 2022-10-18 ENCOUNTER — Telehealth: Payer: Self-pay | Admitting: Neurology

## 2022-10-18 NOTE — Telephone Encounter (Signed)
Patient dismissed from [facility] and all providers practicing at this clinic due to our office No Show Policy [id number]

## 2022-10-22 DIAGNOSIS — Z6835 Body mass index (BMI) 35.0-35.9, adult: Secondary | ICD-10-CM | POA: Diagnosis not present

## 2022-10-22 DIAGNOSIS — E559 Vitamin D deficiency, unspecified: Secondary | ICD-10-CM | POA: Diagnosis not present

## 2022-11-05 DIAGNOSIS — Z9483 Pancreas transplant status: Secondary | ICD-10-CM | POA: Diagnosis not present

## 2022-11-05 DIAGNOSIS — Z940 Kidney transplant status: Secondary | ICD-10-CM | POA: Diagnosis not present

## 2022-12-03 DIAGNOSIS — Z940 Kidney transplant status: Secondary | ICD-10-CM | POA: Diagnosis not present

## 2022-12-03 DIAGNOSIS — Z6836 Body mass index (BMI) 36.0-36.9, adult: Secondary | ICD-10-CM | POA: Diagnosis not present

## 2022-12-03 DIAGNOSIS — R7989 Other specified abnormal findings of blood chemistry: Secondary | ICD-10-CM | POA: Diagnosis not present

## 2022-12-03 DIAGNOSIS — E1169 Type 2 diabetes mellitus with other specified complication: Secondary | ICD-10-CM | POA: Diagnosis not present

## 2022-12-03 DIAGNOSIS — Z48288 Encounter for aftercare following multiple organ transplant: Secondary | ICD-10-CM | POA: Diagnosis not present

## 2022-12-03 DIAGNOSIS — Z9483 Pancreas transplant status: Secondary | ICD-10-CM | POA: Diagnosis not present

## 2022-12-17 DIAGNOSIS — E559 Vitamin D deficiency, unspecified: Secondary | ICD-10-CM | POA: Diagnosis not present

## 2022-12-17 DIAGNOSIS — Z6835 Body mass index (BMI) 35.0-35.9, adult: Secondary | ICD-10-CM | POA: Diagnosis not present

## 2022-12-17 DIAGNOSIS — R7989 Other specified abnormal findings of blood chemistry: Secondary | ICD-10-CM | POA: Diagnosis not present

## 2022-12-20 DIAGNOSIS — H4313 Vitreous hemorrhage, bilateral: Secondary | ICD-10-CM | POA: Diagnosis not present

## 2022-12-20 DIAGNOSIS — E103513 Type 1 diabetes mellitus with proliferative diabetic retinopathy with macular edema, bilateral: Secondary | ICD-10-CM | POA: Diagnosis not present

## 2022-12-20 DIAGNOSIS — H35033 Hypertensive retinopathy, bilateral: Secondary | ICD-10-CM | POA: Diagnosis not present

## 2022-12-24 ENCOUNTER — Ambulatory Visit: Payer: Federal, State, Local not specified - PPO | Admitting: Internal Medicine

## 2022-12-24 NOTE — Progress Notes (Deleted)
Name: Hector Mahoney  MRN/ DOB: [id number], 1971-04-19   Age/ Sex: 52 y.o., male    PCP: Corwin Levins, MD   Reason for Endocrinology Evaluation: Type 2 Diabetes Mellitus     Date of Initial Endocrinology Visit: 07/25/2021    PATIENT IDENTIFIER: Hector Mahoney is a 52 y.o. 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 The patient presented for initial endocrinology clinic visit on 07/25/2021 for consultative assistance with his diabetes management.    HPI: Hector Mahoney was    Diagnosed with DM in 1998  Prior Medications tried/Intolerance: Has been on insulin since his diagnosis.  Hemoglobin A1c has ranged from 7.3% in 2019, peaking at 11.4% in 2014.   Ozempic caused nausea and vomiting  [facility]    Started on Pioglitazone by transplant team 08/2022  SUBJECTIVE:   During the last visit (09/24/2022): A1c 8.8%     Today (12/24/22): Hector Mahoney is here for a follow up on diabetes management.  He checks his  blood sugars 2-3 times daily. The patient  had hypoglycemic episodes since the last clinic visit  Since his last visit here he is s/p P kidney, pancreatic transplant on 12/28/2021 patient is on tacrolimus and prednisone. Had a f/u 08/2022   He has not been taking lantus in a month  due to hypoglycemia at 52 mg/dL at night   Denies nausea, vomiting or diarrhea   HOME ENDOCRINE  REGIMEN: Pioglitazone 30 mg daily Lantus 25 units daily  Novolog 26 units TIDQAC [id number] (BG-130/20)     Statin: yes ACE-I/ARB: yes     DIABETIC COMPLICATIONS: Microvascular complications:  DR ( on monthly injections ) Last eye exam: Completed 06/2021  Macrovascular complications:  CHF Denies: CAD, PVD, CVA   PAST HISTORY: Past Medical History:  Past Medical History:  Diagnosis Date   Anemia    CHF (congestive heart failure) (HCC)    Esophageal reflux  04/27/2009   ESRD (end stage renal disease) on dialysis (HCC)    home HD 4x/week   HYPERLIPIDEMIA 04/24/2007   HYPERTENSION 04/24/2007   Insulin dependent type 1 diabetes mellitus (HCC)    Past Surgical History:  Past Surgical History:  Procedure Laterality Date   AV FISTULA PLACEMENT Left 06/04/2019   Procedure: BRACHIAL-CEPHALIC ARTERIOVENOUS (AV) FISTULA CREATION LEFT ARM;  Surgeon: Larina Earthly, MD;  [facility];  Service: Vascular;  Laterality: Left;   CAPD INSERTION N/A 08/24/2021   Procedure: LAPAROSCOPIC INSERTION CONTINUOUS AMBULATORY PERITONEAL DIALYSIS  (CAPD) CATHETER;  Surgeon: Leonie Douglas, MD;  [facility]: [facility] OR;  Service: Vascular;  Laterality: N/A;   COLONOSCOPY     polyp   IR FLUORO GUIDE CV LINE RIGHT  10/22/2017   IR THROMBECTOMY AV FISTULA W/THROMBOLYSIS/PTA/STENT INC/SHUNT/IMG LT Left 03/2021   IR US GUIDE VASC ACCESS RIGHT  10/22/2017   MOUTH SURGERY     tooth ext    Social History:  reports that he quit smoking about 7 years ago. His smoking use included cigarettes. He has never used smokeless tobacco. He reports that he does not currently use alcohol. He reports that he does not use drugs. Family History:  Family History  Problem Relation Age of Onset   Hypertension Mother    Diabetes Mother    Diabetes Maternal Aunt    Lung cancer Maternal Grandfather    Colon polyps Maternal Uncle    Colon cancer Maternal  Uncle    Heart disease Maternal Uncle    Kidney disease Neg Hx    Esophageal cancer Neg Hx    Rectal cancer Neg Hx    Stomach cancer Neg Hx      HOME MEDICATIONS: Allergies as of 12/24/2022       Reactions   Zinc         Medication List        Accurate as of Dec 24, 2022 10:52 AM. If you have any questions, ask your nurse or doctor.          aspirin EC 81 MG tablet Take by mouth.   atorvastatin 80 MG tablet Commonly known as: LIPITOR Take 80 mg by mouth daily.   Auryxia 1 GM 210 MG(Fe) tablet Generic drug: ferric  citrate Take 420 mg by mouth 3 (three) times daily.   benzonatate 200 MG capsule Commonly known as: TESSALON Take 200 mg by mouth 3 (three) times daily as needed for cough.   carvedilol 25 MG tablet Commonly known as: COREG Take 25 mg by mouth 2 (two) times daily with a meal.   cetirizine 10 MG tablet Commonly known as: ZYRTEC Take 1 tablet by mouth once daily   cinacalcet 30 MG tablet Commonly known as: SENSIPAR Take 60 mg by mouth every evening.   clonazePAM 0.5 MG tablet Commonly known as: KLONOPIN TAKE 1/2 (ONE-HALF) TABLET BY MOUTH AT BEDTIME   cyclobenzaprine 5 MG tablet Commonly known as: FLEXERIL Take 5 mg by mouth 3 (three) times daily as needed for muscle spasms.   dicyclomine 20 MG tablet Commonly known as: BENTYL Take 1 tablet (20 mg total) by mouth in the morning and at bedtime.   Flomax 0.4 MG Caps capsule Generic drug: tamsulosin Take 0.4 mg by mouth.   HYDROcodone-acetaminophen 5-325 MG tablet Commonly known as: Norco Take 1 tablet by mouth every 6 (six) hours as needed for moderate pain.   insulin aspart 100 UNIT/ML injection Commonly known as: novoLOG INJECT 8 TO 12 UNITS INTO THE SKIN THREE TIMES DAILY WITH MEALS What changed: See the new instructions.   insulin glargine 100 UNIT/ML injection Commonly known as: Lantus Inject 0.25 mLs (25 Units total) into the skin at bedtime.   levETIRAcetam 250 MG tablet Commonly known as: Keppra Take 1 tablet (250 mg total) by mouth at bedtime.   losartan 50 MG tablet Commonly known as: COZAAR Take 1 tablet by mouth daily.   Magnesium 400 MG Caps Take 1 tablet by mouth in the morning and at bedtime.   meclizine 12.5 MG tablet Commonly known as: ANTIVERT TAKE 1 TABLET BY MOUTH THREE TIMES DAILY AS NEEDED FOR DIZZINESS   metoprolol succinate 25 MG 24 hr tablet Commonly known as: TOPROL-XL Take 25 mg by mouth daily.   multivitamin Tabs tablet Take 1 tablet by mouth daily.   mycophenolate 180 MG  EC tablet Commonly known as: MYFORTIC Take 3 tablets by mouth 2 (two) times daily.   Norvasc 10 MG tablet Generic drug: amLODipine Take 10 mg by mouth at bedtime.   amLODipine 10 MG tablet Commonly known as: NORVASC Take 1 tablet by mouth daily.   omeprazole 40 MG capsule Commonly known as: PRILOSEC Take 1 capsule (40 mg total) by mouth 2 (two) times daily.   ondansetron 4 MG tablet Commonly known as: Zofran Take 1 tablet (4 mg total) by mouth every 8 (eight) hours as needed for nausea or vomiting.   pioglitazone 30 MG tablet Commonly known as:  ACTOS Take 30 mg by mouth daily.   polyethylene glycol powder 17 GM/SCOOP powder Commonly known as: GLYCOLAX/MIRALAX Take 1 Container by mouth daily as needed for mild constipation.   tacrolimus 1 MG capsule Commonly known as: PROGRAF Take 1 mg by mouth 2 (two) times daily.   triamcinolone 55 MCG/ACT Aero nasal inhaler Commonly known as: NASACORT Place 2 sprays into the nose daily.   Zinc 50 MG Tabs Take 1 tablet by mouth daily at 6 (six) AM.         ALLERGIES: Allergies  Allergen Reactions   Zinc         OBJECTIVE:   VITAL SIGNS: There were no vitals taken for this visit.   PHYSICAL EXAM:  General: Pt appears well and is in NAD  Neck: General: Supple without adenopathy or carotid bruits. Thyroid: Thyroid size normal.   Lungs: Clear with good BS bilat   Heart: RRR   Abdomen: soft, nontender  Extremities:  Lower extremities - No pretibial edema.   Neuro: MS is good with appropriate affect, pt is alert and Ox3    DM foot exam:09/24/2022  The skin of the feet is without sores or ulcerations.Callous formation at the 1st MT head B/L  The pedal pulses are undetectable  The sensation is decreased  to a screening 5.07, 10 gram monofilament on the left     DATA REVIEWED:  Lab Results  Component Value Date   HGBA1C 8.8 (A) 09/24/2022   HGBA1C 6.0 (A) 05/10/2022   HGBA1C 9.3 (A) 07/25/2021   Lab Results   Component Value Date   CREATININE 27.76 (H) 11/23/2021   No results found for: "MICRALBCREAT"  Lab Results  Component Value Date   CHOL 183 06/25/2019   HDL 58.40 06/25/2019   LDLDIRECT 80.0 06/25/2019   TRIG 214.0 (H) 06/25/2019   CHOLHDL 3 06/25/2019         ASSESSMENT / PLAN / RECOMMENDATIONS:   1) Type 2 Diabetes Mellitus,Poorly   controlled, With neuropathic, retinopathic complications , S/P kidney /pancreas transplant  - Most recent A1c of 8.8 %. Goal A1c < 7.0 %.     -Patient is s/p pancreatic and renal transplant in May 2023 -He is currently on prednisone and tacrolimus  -Intolerant to Ozempic -He also obtains Insulin through [place]  -Due to hypoglycemia, the patient has stopped using basal insulin for approximately a month ago.  We have not been able to download his Dexcom today but I was able to manually look at his app and the patient has been noted with hypoglycemia overnight, I did recommend restarting basal insulin at a smaller dose as below -I will also reduce his prandial dose of insulin, as he has been noted with a quick decrease in his BG's postprandial, he was encouraged to use NovoLog before the meal and use correction scale as needed -Patient was started on pioglitazone through his transplant team, no intolerance issue, will continue  MEDICATIONS:  Continue pioglitazone 30 mg daily  Restart Lantus 25 units daily  Decrease Novolog 26 units with each meal  Continue correction scale : Novolog (BG -130/20)   EDUCATION / INSTRUCTIONS: BG monitoring instructions: Patient is instructed to check his blood sugars 3 times a day, before meals . Call [place] [facility] clinic if: BG persistently < 70  I reviewed the Rule of 15 for the treatment of hypoglycemia in detail with the patient. Literature supplied.   2) Diabetic complications:  Eye: Does  have known diabetic retinopathy.  Neuro/  Feet: Does  have known diabetic peripheral neuropathy. Renal: Patient does   have known baseline CKD. He is  on an ACEI/ARB at present.     F/U in 6 months            Signed electronically by: Lyndle Herrlich, MD  [facility] Endocrinology  [facility] [address] [place] [place], [place] [address] Phone: [phone] FAX: [phone]   CC: Corwin Levins, MD [address] [place] [address] Phone: [phone]  Fax: [phone]    Return to Endocrinology clinic as below: Future Appointments  Date Time Provider Department Center  12/24/2022 12:10 PM Shellee Streng, Konrad Dolores, MD [facility] None

## 2022-12-26 ENCOUNTER — Telehealth: Payer: Self-pay | Admitting: Internal Medicine

## 2022-12-26 MED ORDER — CARVEDILOL 25 MG PO TABS: 25.0000 mg | ORAL_TABLET | Freq: Two times a day (BID) | ORAL | 1 refills | Status: DC

## 2022-12-26 NOTE — Telephone Encounter (Signed)
MEDICATION: Carvedilol  PHARMACY:  [id number] - [place] ([place]), [place] - [address]   HAS THE PATIENT CONTACTED THEIR PHARMACY?  YES  IS THIS A 90 DAY SUPPLY :  unknown  IS PATIENT OUT OF MEDICATION: yes , no refills left  IF NOT; HOW MUCH IS LEFT:   LAST APPOINTMENT DATE: @5 /13/2024  NEXT APPOINTMENT DATE:@8 /15/2024  DO WE HAVE YOUR PERMISSION TO LEAVE A DETAILED MESSAGE?: yes  OTHER COMMENTS:    **Let patient know to contact pharmacy at the end of the day to make sure medication is ready. **  ** Please notify patient to allow 48-72 hours to process**  **Encourage patient to contact the pharmacy for refills or they can request refills through [facility]**

## 2022-12-26 NOTE — Telephone Encounter (Signed)
Ok done erx 

## 2022-12-31 DIAGNOSIS — E119 Type 2 diabetes mellitus without complications: Secondary | ICD-10-CM | POA: Diagnosis not present

## 2022-12-31 DIAGNOSIS — Z940 Kidney transplant status: Secondary | ICD-10-CM | POA: Diagnosis not present

## 2022-12-31 DIAGNOSIS — Z794 Long term (current) use of insulin: Secondary | ICD-10-CM | POA: Diagnosis not present

## 2022-12-31 DIAGNOSIS — N3289 Other specified disorders of bladder: Secondary | ICD-10-CM | POA: Diagnosis not present

## 2022-12-31 DIAGNOSIS — Z9483 Pancreas transplant status: Secondary | ICD-10-CM | POA: Diagnosis not present

## 2022-12-31 DIAGNOSIS — N261 Atrophy of kidney (terminal): Secondary | ICD-10-CM | POA: Diagnosis not present

## 2022-12-31 DIAGNOSIS — Z48288 Encounter for aftercare following multiple organ transplant: Secondary | ICD-10-CM | POA: Diagnosis not present

## 2022-12-31 DIAGNOSIS — D849 Immunodeficiency, unspecified: Secondary | ICD-10-CM | POA: Diagnosis not present

## 2023-01-02 ENCOUNTER — Ambulatory Visit (INDEPENDENT_AMBULATORY_CARE_PROVIDER_SITE_OTHER): Payer: Federal, State, Local not specified - PPO | Admitting: Internal Medicine

## 2023-01-02 ENCOUNTER — Encounter: Payer: Self-pay | Admitting: Internal Medicine

## 2023-01-02 VITALS — BP 124/82 | HR 93 | Ht 66.0 in | Wt 230.0 lb

## 2023-01-02 DIAGNOSIS — Z940 Kidney transplant status: Secondary | ICD-10-CM | POA: Diagnosis not present

## 2023-01-02 DIAGNOSIS — Z794 Long term (current) use of insulin: Secondary | ICD-10-CM

## 2023-01-02 DIAGNOSIS — E1165 Type 2 diabetes mellitus with hyperglycemia: Secondary | ICD-10-CM | POA: Diagnosis not present

## 2023-01-02 DIAGNOSIS — E1142 Type 2 diabetes mellitus with diabetic polyneuropathy: Secondary | ICD-10-CM

## 2023-01-02 DIAGNOSIS — E113593 Type 2 diabetes mellitus with proliferative diabetic retinopathy without macular edema, bilateral: Secondary | ICD-10-CM

## 2023-01-02 DIAGNOSIS — Z9483 Pancreas transplant status: Secondary | ICD-10-CM

## 2023-01-02 NOTE — Progress Notes (Signed)
Name: Hector Mahoney  MRN/ DOB: [id number], 04-Feb-1971   Age/ Sex: 52 y.o., male    PCP: Hector Levins, MD   Reason for Endocrinology Evaluation: Type 2 Diabetes Mellitus     Date of Initial Endocrinology Visit: 07/25/2021    PATIENT IDENTIFIER: [name] is a 52 y.o. 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 The patient presented for initial endocrinology clinic visit on 07/25/2021 for consultative assistance with his diabetes management.    HPI: [name] was    Diagnosed with DM in 1998  Prior Medications tried/Intolerance: Has been on insulin since his diagnosis.  Hemoglobin A1c has ranged from 7.3% in 2019, peaking at 11.4% in 2014.   Ozempic caused nausea and vomiting  [facility]    Started on Pioglitazone by transplant team 08/2022  SUBJECTIVE:   During the last visit (09/24/2022): A1c 8.8%     Today (01/02/23): [name] is here for a follow up on diabetes management.  He checks his  blood sugars multiple times a day through dexcom. The patient  had hypoglycemic episodes since the last clinic visit  Since his last visit here he is s/p P kidney, pancreatic transplant on 12/28/2021 patient is on tacrolimus and prednisone. Had a f/u 08/2022   On his last visit he had stopped taking basal insulin due to hypoglycemia and we reduced it and restarted him on it, today he is not taking it again for unknown reason , he was recently restarted 50 units    He continues to follow up with [facility] transplant team , they are trying to start him on Farxiga   Denies nausea, vomiting Denies constipation or diarrhea   HOME ENDOCRINE  REGIMEN: Pioglitazone 30 mg daily Lantus 25 units daily  Novolog 26 units TIDQAC- takes 30 units  [id number] (BG-130/20)     Statin: yes ACE-I/ARB: yes     DIABETIC COMPLICATIONS: Microvascular complications:  DR ( on  monthly injections ) Last eye exam: Completed 06/2021  Macrovascular complications:  CHF Denies: CAD, PVD, CVA   PAST HISTORY: Past Medical History:  Past Medical History:  Diagnosis Date   Anemia    CHF (congestive heart failure) (HCC)    Esophageal reflux 04/27/2009   ESRD (end stage renal disease) on dialysis (HCC)    home HD 4x/week   HYPERLIPIDEMIA 04/24/2007   HYPERTENSION 04/24/2007   Insulin dependent type 1 diabetes mellitus (HCC)    Past Surgical History:  Past Surgical History:  Procedure Laterality Date   AV FISTULA PLACEMENT Left 06/04/2019   Procedure: BRACHIAL-CEPHALIC ARTERIOVENOUS (AV) FISTULA CREATION LEFT ARM;  Surgeon: Larina Earthly, MD;  [facility];  Service: Vascular;  Laterality: Left;   CAPD INSERTION N/A 08/24/2021   Procedure: LAPAROSCOPIC INSERTION CONTINUOUS AMBULATORY PERITONEAL DIALYSIS  (CAPD) CATHETER;  Surgeon: Leonie Douglas, MD;  Location: [facility] OR;  Service: Vascular;  Laterality: N/A;   COLONOSCOPY     polyp   IR FLUORO GUIDE CV LINE RIGHT  10/22/2017   IR THROMBECTOMY AV FISTULA W/THROMBOLYSIS/PTA/STENT INC/SHUNT/IMG LT Left 03/2021   IR US GUIDE VASC ACCESS RIGHT  10/22/2017   MOUTH SURGERY     tooth ext    Social History:  reports that he quit smoking about 7 years ago. His smoking use included cigarettes. He has never used smokeless tobacco. He reports that he does not currently use alcohol. He reports that  he does not use drugs. Family History:  Family History  Problem Relation Age of Onset   Hypertension Mother    Diabetes Mother    Diabetes Maternal Aunt    Lung cancer Maternal Grandfather    Colon polyps Maternal Uncle    Colon cancer Maternal Uncle    Heart disease Maternal Uncle    Kidney disease Neg Hx    Esophageal cancer Neg Hx    Rectal cancer Neg Hx    Stomach cancer Neg Hx      HOME MEDICATIONS: Allergies as of 01/02/2023       Reactions   Zinc         Medication List        Accurate as of Jan 02, 2023 12:13 PM. If you have any questions, ask your nurse or doctor.          aspirin EC 81 MG tablet Take by mouth.   atorvastatin 80 MG tablet Commonly known as: LIPITOR Take 80 mg by mouth daily.   Auryxia 1 GM 210 MG(Fe) tablet Generic drug: ferric citrate Take 420 mg by mouth 3 (three) times daily.   benzonatate 200 MG capsule Commonly known as: TESSALON Take 200 mg by mouth 3 (three) times daily as needed for cough.   carvedilol 25 MG tablet Commonly known as: Coreg Take 1 tablet (25 mg total) by mouth 2 (two) times daily.   cetirizine 10 MG tablet Commonly known as: ZYRTEC Take 1 tablet by mouth once daily   cinacalcet 30 MG tablet Commonly known as: SENSIPAR Take 60 mg by mouth every evening.   clonazePAM 0.5 MG tablet Commonly known as: KLONOPIN TAKE 1/2 (ONE-HALF) TABLET BY MOUTH AT BEDTIME   cyclobenzaprine 5 MG tablet Commonly known as: FLEXERIL Take 5 mg by mouth 3 (three) times daily as needed for muscle spasms.   dicyclomine 20 MG tablet Commonly known as: BENTYL Take 1 tablet (20 mg total) by mouth in the morning and at bedtime.   Farxiga 10 MG Tabs tablet Generic drug: dapagliflozin propanediol Take 10 mg by mouth daily.   Flomax 0.4 MG Caps capsule Generic drug: tamsulosin Take 0.4 mg by mouth.   HYDROcodone-acetaminophen 5-325 MG tablet Commonly known as: Norco Take 1 tablet by mouth every 6 (six) hours as needed for moderate pain.   insulin aspart 100 UNIT/ML injection Commonly known as: novoLOG INJECT 8 TO 12 UNITS INTO THE SKIN THREE TIMES DAILY WITH MEALS What changed: See the new instructions.   insulin glargine 100 UNIT/ML injection Commonly known as: Lantus Inject 0.25 mLs (25 Units total) into the skin at bedtime.   levETIRAcetam 250 MG tablet Commonly known as: Keppra Take 1 tablet (250 mg total) by mouth at bedtime.   losartan 50 MG tablet Commonly known as: COZAAR Take 1 tablet by mouth daily.   Magnesium 400  MG Caps Take 1 tablet by mouth in the morning and at bedtime.   meclizine 12.5 MG tablet Commonly known as: ANTIVERT TAKE 1 TABLET BY MOUTH THREE TIMES DAILY AS NEEDED FOR DIZZINESS   metoprolol succinate 25 MG 24 hr tablet Commonly known as: TOPROL-XL Take 25 mg by mouth daily.   multivitamin Tabs tablet Take 1 tablet by mouth daily.   mycophenolate 180 MG EC tablet Commonly known as: MYFORTIC Take 3 tablets by mouth 2 (two) times daily.   Norvasc 10 MG tablet Generic drug: amLODipine Take 10 mg by mouth at bedtime.   amLODipine 10 MG tablet Commonly  known as: NORVASC Take 1 tablet by mouth daily.   omeprazole 40 MG capsule Commonly known as: PRILOSEC Take 1 capsule (40 mg total) by mouth 2 (two) times daily.   ondansetron 4 MG tablet Commonly known as: Zofran Take 1 tablet (4 mg total) by mouth every 8 (eight) hours as needed for nausea or vomiting.   pioglitazone 30 MG tablet Commonly known as: ACTOS Take 30 mg by mouth daily.   polyethylene glycol powder 17 GM/SCOOP powder Commonly known as: GLYCOLAX/MIRALAX Take 1 Container by mouth daily as needed for mild constipation.   tacrolimus 1 MG capsule Commonly known as: PROGRAF Take 1 mg by mouth 2 (two) times daily.   triamcinolone 55 MCG/ACT Aero nasal inhaler Commonly known as: NASACORT Place 2 sprays into the nose daily.   Zinc 50 MG Tabs Take 1 tablet by mouth daily at 6 (six) AM.         ALLERGIES: Allergies  Allergen Reactions   Zinc         OBJECTIVE:   VITAL SIGNS: BP 124/82 (BP Location: Right Arm, Patient Position: Sitting, Cuff Size: Large)   Pulse 93   Ht 5\' 6"  (1.676 m)   Wt 230 lb (104.3 kg)   SpO2 96%   BMI 37.12 kg/m    PHYSICAL EXAM:  General: Pt appears well and is in NAD  Neck: General: Supple without adenopathy or carotid bruits. Thyroid: Thyroid size normal.   Lungs: Clear with good BS bilat   Heart: RRR   Abdomen: soft, nontender  Extremities:  Lower  extremities - No pretibial edema.   Neuro: MS is good with appropriate affect, pt is alert and Ox3    DM foot exam:09/24/2022  The skin of the feet is without sores or ulcerations.Callous formation at the 1st MT head B/L  The pedal pulses are undetectable  The sensation is decreased  to a screening 5.07, 10 gram monofilament on the left     DATA REVIEWED:  Lab Results  Component Value Date   HGBA1C 8.8 (A) 09/24/2022   HGBA1C 6.0 (A) 05/10/2022   HGBA1C 9.3 (A) 07/25/2021   12/31/2022 A1c 11.2% BUN 21 Cr 1.1 GFR 82 Ca 10.7  ASSESSMENT / PLAN / RECOMMENDATIONS:   1) Type 2 Diabetes Mellitus,Poorly controlled, With neuropathic, retinopathic complications , S/P kidney /pancreas transplant  - Most recent A1c of 11.2 %. Goal A1c < 7.0 %.    - A1c increased from 8.8% to 11.2%  -Patient is s/p pancreatic and renal transplant in May 2023 -He is currently on prednisone and tacrolimus  -Intolerant to [employer] -He also obtains Insulin through [place]  -Patient was started on pioglitazone through his transplant team, no intolerance issue, will continue  MEDICATIONS:  Continue pioglitazone 30 mg daily  Restart Lantus 25 units daily  Decrease Novolog 26 units with each meal  Continue correction scale : Novolog (BG -130/20)   EDUCATION / INSTRUCTIONS: BG monitoring instructions: Patient is instructed to check his blood sugars 3 times a day, before meals . Call [facility] if: BG persistently < 70  I reviewed the Rule of 15 for the treatment of hypoglycemia in detail with the patient. Literature supplied.   2) Diabetic complications:  Eye: Does  have known diabetic retinopathy.  Neuro/ Feet: Does  have known diabetic peripheral neuropathy. Renal: Patient does  have known baseline CKD. He is  on an ACEI/ARB at present.     F/U in 6 months  Signed electronically by: Lyndle Herrlich, MD  [facility]  [facility] [address] [place], [place] [address] Phone: [phone] FAX: [phone]   CC: Hector Levins, MD [address] [place] [address] Phone: [phone]  Fax: [phone]    Return to Endocrinology clinic as below: No future appointments.

## 2023-01-02 NOTE — Patient Instructions (Signed)
Continue Pioglitazone 30 mg , 1 tablet daily  Take Lantus 40 units daily  Novolog 30 units with each meal  Novolog correctional insulin: ADD extra units on insulin to your meal-time Novolog dose if your blood sugars are higher than 150. Use the scale below to help guide you:   Blood sugar before meal Number of units to inject  Less than 150 0 unit  151 -  170 1 units  171 -  190 2 units  191 -  210 3 units  211 -  230 4 units  231 -  250 5 units  251 -  270 6 units  271 -  290 7 units  291 -  310 8 units  311 - 330 9 units      HOW TO TREAT LOW BLOOD SUGARS (Blood sugar LESS THAN 70 MG/DL) Please follow the RULE OF 15 for the treatment of hypoglycemia treatment (when your (blood sugars are less than 70 mg/dL)   STEP 1: Take 15 grams of carbohydrates when your blood sugar is low, which includes:  3-4 GLUCOSE TABS  OR 3-4 OZ OF JUICE OR REGULAR SODA OR ONE TUBE OF GLUCOSE GEL    STEP 2: RECHECK blood sugar in 15 MINUTES STEP 3: If your blood sugar is still low at the 15 minute recheck --> then, go back to STEP 1 and treat AGAIN with another 15 grams of carbohydrates.

## 2023-01-03 MED ORDER — INSULIN GLARGINE 100 UNIT/ML ~~LOC~~ SOLN: 40.0000 [IU] | Freq: Every day | SUBCUTANEOUS | 3 refills | Status: DC

## 2023-01-25 ENCOUNTER — Encounter: Payer: Self-pay | Admitting: Internal Medicine

## 2023-01-25 ENCOUNTER — Ambulatory Visit (INDEPENDENT_AMBULATORY_CARE_PROVIDER_SITE_OTHER): Payer: Federal, State, Local not specified - PPO | Admitting: Internal Medicine

## 2023-01-25 VITALS — BP 126/80 | HR 100 | Ht 66.0 in | Wt 231.0 lb

## 2023-01-25 DIAGNOSIS — Z794 Long term (current) use of insulin: Secondary | ICD-10-CM

## 2023-01-25 DIAGNOSIS — Z9483 Pancreas transplant status: Secondary | ICD-10-CM

## 2023-01-25 DIAGNOSIS — E1165 Type 2 diabetes mellitus with hyperglycemia: Secondary | ICD-10-CM

## 2023-01-25 DIAGNOSIS — E1142 Type 2 diabetes mellitus with diabetic polyneuropathy: Secondary | ICD-10-CM | POA: Diagnosis not present

## 2023-01-25 DIAGNOSIS — Z940 Kidney transplant status: Secondary | ICD-10-CM

## 2023-01-25 DIAGNOSIS — E113593 Type 2 diabetes mellitus with proliferative diabetic retinopathy without macular edema, bilateral: Secondary | ICD-10-CM | POA: Diagnosis not present

## 2023-01-25 NOTE — Patient Instructions (Addendum)
Continue Farxiga 10 mg daily  Continue Pioglitazone 30 mg , 1 tablet daily  Increase  Lantus 52 units daily  Novolog 30 units with each meal  Novolog correctional insulin: ADD extra units on insulin to your meal-time Novolog dose if your blood sugars are higher than 150. Use the scale below to help guide you:   Blood sugar before meal Number of units to inject  Less than 150 0 unit  151 -  170 1 units  171 -  190 2 units  191 -  210 3 units  211 -  230 4 units  231 -  250 5 units  251 -  270 6 units  271 -  290 7 units  291 -  310 8 units  311 - 330 9 units      HOW TO TREAT LOW BLOOD SUGARS (Blood sugar LESS THAN 70 MG/DL) Please follow the RULE OF 15 for the treatment of hypoglycemia treatment (when your (blood sugars are less than 70 mg/dL)   STEP 1: Take 15 grams of carbohydrates when your blood sugar is low, which includes:  3-4 GLUCOSE TABS  OR 3-4 OZ OF JUICE OR REGULAR SODA OR ONE TUBE OF GLUCOSE GEL    STEP 2: RECHECK blood sugar in 15 MINUTES STEP 3: If your blood sugar is still low at the 15 minute recheck --> then, go back to STEP 1 and treat AGAIN with another 15 grams of carbohydrates.

## 2023-01-25 NOTE — Progress Notes (Signed)
Name: Hector Mahoney  MRN/ DOB: [id number], 02-13-1971   Age/ Sex: 52 y.o., male    PCP: Corwin Levins, MD   Reason for Endocrinology Evaluation: Type 2 Diabetes Mellitus     Date of Initial Endocrinology Visit: 07/25/2021    PATIENT IDENTIFIER: [name] is a 52 y.o. 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 The patient presented for initial endocrinology clinic visit on 07/25/2021 for consultative assistance with his diabetes management.    HPI: [name] was    Diagnosed with DM in 1998  Prior Medications tried/Intolerance: Has been on insulin since his diagnosis.  Hemoglobin A1c has ranged from 7.3% in 2019, peaking at 11.4% in 2014.   Ozempic caused nausea and vomiting  [facility]    Started on Pioglitazone by transplant team 08/2022  Start Farxiga through transplant team 12/2022  SUBJECTIVE:   During the last visit (01/02/2023): A1c 11.2%     Today (01/25/23): [name] is here for a follow up on diabetes management.  He checks his  blood sugars multiple times a day through dexcom. The patient  had hypoglycemic episodes since the last clinic visit  Since his last visit here he is s/p P kidney, pancreatic transplant on 12/28/2021 patient is on tacrolimus and prednisone.  He continues to follow-up with the transplant team at [facility]   Patient follows up with [facility]   Denies nausea, vomiting Denies constipation or diarrhea   He did have issues with fluid overload in the upper body, but this has resolved with diuretic changes to her transplant team  HOME ENDOCRINE  REGIMEN: Farxiga 10 mg daily through transplant team Pioglitazone 30 mg daily Lantus 40 units daily - 50 units  Novolog 30 units TIDQAC [id number] (BG-130/20)     Statin: yes ACE-I/ARB: yes  CGM: Unable to download   DIABETIC COMPLICATIONS: Microvascular  complications:  DR ( on monthly injections ) Last eye exam: Completed 06/2021  Macrovascular complications:  CHF Denies: CAD, PVD, CVA   PAST HISTORY: Past Medical History:  Past Medical History:  Diagnosis Date   Anemia    CHF (congestive heart failure) (HCC)    Esophageal reflux 04/27/2009   ESRD (end stage renal disease) on dialysis (HCC)    home HD 4x/week   HYPERLIPIDEMIA 04/24/2007   HYPERTENSION 04/24/2007   Insulin dependent type 1 diabetes mellitus (HCC)    Past Surgical History:  Past Surgical History:  Procedure Laterality Date   AV FISTULA PLACEMENT Left 06/04/2019   Procedure: BRACHIAL-CEPHALIC ARTERIOVENOUS (AV) FISTULA CREATION LEFT ARM;  Surgeon: Larina Earthly, MD;  Location: [facility];  Service: Vascular;  Laterality: Left;   CAPD INSERTION N/A 08/24/2021   Procedure: LAPAROSCOPIC INSERTION CONTINUOUS AMBULATORY PERITONEAL DIALYSIS  (CAPD) CATHETER;  Surgeon: Leonie Douglas, MD;  Location: [facility] OR;  Service: Vascular;  Laterality: N/A;   COLONOSCOPY     polyp   IR FLUORO GUIDE CV LINE RIGHT  10/22/2017   IR THROMBECTOMY AV FISTULA W/THROMBOLYSIS/PTA/STENT INC/SHUNT/IMG LT Left 03/2021   IR US GUIDE VASC ACCESS RIGHT  10/22/2017   MOUTH SURGERY     tooth ext    Social History:  reports that he quit smoking about 7 years ago. His smoking use included cigarettes. He has never used smokeless tobacco. He reports that he does not currently use alcohol. He reports that he does not use drugs. Family History:  Family History  Problem Relation Age of Onset   Hypertension Mother    Diabetes Mother    Diabetes Maternal Aunt    Lung cancer Maternal Grandfather    Colon polyps Maternal Uncle    Colon cancer Maternal Uncle    Heart disease Maternal Uncle    Kidney disease Neg Hx    Esophageal cancer Neg Hx    Rectal cancer Neg Hx    Stomach cancer Neg Hx      HOME MEDICATIONS: Allergies as of 01/25/2023       Reactions   Zinc         Medication List         Accurate as of January 25, 2023 12:11 PM. If you have any questions, ask your nurse or doctor.          STOP taking these medications    benzonatate 200 MG capsule Commonly known as: TESSALON Stopped by: Scarlette Shorts, MD       TAKE these medications    aspirin EC 81 MG tablet Take by mouth.   atorvastatin 80 MG tablet Commonly known as: LIPITOR Take 80 mg by mouth daily.   Auryxia 1 GM 210 MG(Fe) tablet Generic drug: ferric citrate Take 420 mg by mouth 3 (three) times daily.   carvedilol 25 MG tablet Commonly known as: Coreg Take 1 tablet (25 mg total) by mouth 2 (two) times daily.   cetirizine 10 MG tablet Commonly known as: ZYRTEC Take 1 tablet by mouth once daily   cinacalcet 30 MG tablet Commonly known as: SENSIPAR Take 60 mg by mouth every evening.   clonazePAM 0.5 MG tablet Commonly known as: KLONOPIN TAKE 1/2 (ONE-HALF) TABLET BY MOUTH AT BEDTIME   cyclobenzaprine 5 MG tablet Commonly known as: FLEXERIL Take 5 mg by mouth 3 (three) times daily as needed for muscle spasms.   dicyclomine 20 MG tablet Commonly known as: BENTYL Take 1 tablet (20 mg total) by mouth in the morning and at bedtime.   Farxiga 10 MG Tabs tablet Generic drug: dapagliflozin propanediol Take 10 mg by mouth daily.   Flomax 0.4 MG Caps capsule Generic drug: tamsulosin Take 0.4 mg by mouth.   HYDROcodone-acetaminophen 5-325 MG tablet Commonly known as: Norco Take 1 tablet by mouth every 6 (six) hours as needed for moderate pain.   insulin aspart 100 UNIT/ML injection Commonly known as: novoLOG INJECT 8 TO 12 UNITS INTO THE SKIN THREE TIMES DAILY WITH MEALS What changed: See the new instructions.   insulin glargine 100 UNIT/ML injection Commonly known as: Lantus Inject 0.4 mLs (40 Units total) into the skin at bedtime. What changed: how much to take   levETIRAcetam 250 MG tablet Commonly known as: Keppra Take 1 tablet (250 mg total) by mouth at bedtime.    losartan 50 MG tablet Commonly known as: COZAAR Take 1 tablet by mouth daily.   Magnesium 400 MG Caps Take 1 tablet by mouth in the morning and at bedtime.   meclizine 12.5 MG tablet Commonly known as: ANTIVERT TAKE 1 TABLET BY MOUTH THREE TIMES DAILY AS NEEDED FOR DIZZINESS   metoprolol succinate 25 MG 24 hr tablet Commonly known as: TOPROL-XL Take 25 mg by mouth daily.   multivitamin Tabs tablet Take 1 tablet by mouth daily.   mycophenolate 180 MG EC tablet Commonly known as: MYFORTIC Take 3 tablets by mouth 2 (two) times daily.   Norvasc 10 MG tablet Generic drug: amLODipine Take 10 mg by mouth at  bedtime.   amLODipine 10 MG tablet Commonly known as: NORVASC Take 1 tablet by mouth daily.   omeprazole 40 MG capsule Commonly known as: PRILOSEC Take 1 capsule (40 mg total) by mouth 2 (two) times daily.   ondansetron 4 MG tablet Commonly known as: Zofran Take 1 tablet (4 mg total) by mouth every 8 (eight) hours as needed for nausea or vomiting.   pioglitazone 30 MG tablet Commonly known as: ACTOS Take 30 mg by mouth daily.   polyethylene glycol powder 17 GM/SCOOP powder Commonly known as: GLYCOLAX/MIRALAX Take 1 Container by mouth daily as needed for mild constipation.   tacrolimus 1 MG capsule Commonly known as: PROGRAF Take 1 mg by mouth 2 (two) times daily.   triamcinolone 55 MCG/ACT Aero nasal inhaler Commonly known as: NASACORT Place 2 sprays into the nose daily.   Zinc 50 MG Tabs Take 1 tablet by mouth daily at 6 (six) AM.         ALLERGIES: Allergies  Allergen Reactions   Zinc         OBJECTIVE:   VITAL SIGNS: BP 126/80 (BP Location: Left Arm, Patient Position: Sitting, Cuff Size: Large)   Pulse 100   Ht 5\' 6"  (1.676 m)   Wt 231 lb (104.8 kg)   SpO2 98%   BMI 37.28 kg/m    PHYSICAL EXAM:  General: Pt appears well and is in NAD  Lungs: Clear with good BS bilat   Heart: RRR   Extremities:  Lower extremities - No pretibial  edema.   Neuro: MS is good with appropriate affect, pt is alert and Ox3    DM foot exam:09/24/2022  The skin of the feet is without sores or ulcerations.Callous formation at the 1st MT head B/L  The pedal pulses are undetectable  The sensation is decreased  to a screening 5.07, 10 gram monofilament on the left     DATA REVIEWED:  Lab Results  Component Value Date   HGBA1C 8.8 (A) 09/24/2022   HGBA1C 6.0 (A) 05/10/2022   HGBA1C 9.3 (A) 07/25/2021   12/31/2022 A1c 11.2% BUN 21 Cr 1.1 GFR 82 Ca 10.7  ASSESSMENT / PLAN / RECOMMENDATIONS:   1) Type 2 Diabetes Mellitus,Poorly controlled, With neuropathic, retinopathic complications , S/P kidney /pancreas transplant  - Most recent A1c of 11.2 %. Goal A1c < 7.0 %.    -He has been taking his insulin as prescribed -I was unable to download his Dexcom again today, patient needed a new password, he was again advised to reset the password.  In reviewing CGM, the patient has been noted with improvement in his BG readings overnight, continues to be slightly above goal, but it worsens through the day.  He does assure me compliance with taking NovoLog before meals, he was encouraged to start using the correction scale before each meal as well -Will increase Lantus as below -Patient is s/p pancreatic and renal transplant in May 2023 -He is currently on prednisone and tacrolimus  -Intolerant to [employer] -He also obtains Insulin through [place]  -Patient was started on pioglitazone and Farxiga through his transplant team.  MEDICATIONS:  Continue Farxiga 10 mg daily transplant team Continue pioglitazone 30 mg daily  Increase Lantus 52 units daily  Continue Novolog 30 units with each meal  Continue correction scale : Novolog (BG -130/20) TIDQAC  EDUCATION / INSTRUCTIONS: BG monitoring instructions: Patient is instructed to check his blood sugars 3 times a day, before meals . Call [facility] clinic if: BG  persistently < 70  I reviewed  the Rule of 15 for the treatment of hypoglycemia in detail with the patient. Literature supplied.   2) Diabetic complications:  Eye: Does  have known diabetic retinopathy.  Neuro/ Feet: Does  have known diabetic peripheral neuropathy. Renal: Patient does  have known baseline CKD, S/P transplant . He is  on an ACEI/ARB at present.     F/U in 3 months            Signed electronically by: Lyndle Herrlich, MD  [facility] Endocrinology  [facility] [address] [place] [place], [place] [address] Phone: [phone] FAX: [phone]   CC: Corwin Levins, MD [address] [place] [address] Phone: [phone]  Fax: [phone]    Return to Endocrinology clinic as below: No future appointments.

## 2023-01-26 DIAGNOSIS — R112 Nausea with vomiting, unspecified: Secondary | ICD-10-CM | POA: Diagnosis not present

## 2023-01-26 DIAGNOSIS — R109 Unspecified abdominal pain: Secondary | ICD-10-CM | POA: Diagnosis not present

## 2023-02-09 ENCOUNTER — Inpatient Hospital Stay (HOSPITAL_BASED_OUTPATIENT_CLINIC_OR_DEPARTMENT_OTHER)
Admission: EM | Admit: 2023-02-09 | Discharge: 2023-02-12 | DRG: 638 | Disposition: A | Discharge status: Home / Self Care | Payer: Federal, State, Local not specified - PPO | Attending: Internal Medicine | Admitting: Internal Medicine

## 2023-02-09 ENCOUNTER — Encounter (HOSPITAL_BASED_OUTPATIENT_CLINIC_OR_DEPARTMENT_OTHER): Payer: Self-pay | Admitting: Emergency Medicine

## 2023-02-09 ENCOUNTER — Emergency Department (HOSPITAL_BASED_OUTPATIENT_CLINIC_OR_DEPARTMENT_OTHER): Payer: Federal, State, Local not specified - PPO

## 2023-02-09 DIAGNOSIS — R739 Hyperglycemia, unspecified: Secondary | ICD-10-CM

## 2023-02-09 DIAGNOSIS — T502X5A Adverse effect of carbonic-anhydrase inhibitors, benzothiadiazides and other diuretics, initial encounter: Secondary | ICD-10-CM | POA: Diagnosis present

## 2023-02-09 DIAGNOSIS — R531 Weakness: Secondary | ICD-10-CM | POA: Diagnosis not present

## 2023-02-09 DIAGNOSIS — Z8249 Family history of ischemic heart disease and other diseases of the circulatory system: Secondary | ICD-10-CM

## 2023-02-09 DIAGNOSIS — E871 Hypo-osmolality and hyponatremia: Secondary | ICD-10-CM | POA: Diagnosis present

## 2023-02-09 DIAGNOSIS — E876 Hypokalemia: Secondary | ICD-10-CM | POA: Diagnosis present

## 2023-02-09 DIAGNOSIS — E785 Hyperlipidemia, unspecified: Secondary | ICD-10-CM | POA: Diagnosis not present

## 2023-02-09 DIAGNOSIS — Z79899 Other long term (current) drug therapy: Secondary | ICD-10-CM | POA: Diagnosis not present

## 2023-02-09 DIAGNOSIS — I5032 Chronic diastolic (congestive) heart failure: Secondary | ICD-10-CM | POA: Diagnosis not present

## 2023-02-09 DIAGNOSIS — Z7984 Long term (current) use of oral hypoglycemic drugs: Secondary | ICD-10-CM

## 2023-02-09 DIAGNOSIS — E1065 Type 1 diabetes mellitus with hyperglycemia: Secondary | ICD-10-CM | POA: Diagnosis not present

## 2023-02-09 DIAGNOSIS — N19 Unspecified kidney failure: Secondary | ICD-10-CM | POA: Diagnosis not present

## 2023-02-09 DIAGNOSIS — Z87891 Personal history of nicotine dependence: Secondary | ICD-10-CM | POA: Diagnosis not present

## 2023-02-09 DIAGNOSIS — I110 Hypertensive heart disease with heart failure: Secondary | ICD-10-CM | POA: Diagnosis present

## 2023-02-09 DIAGNOSIS — E860 Dehydration: Secondary | ICD-10-CM | POA: Diagnosis not present

## 2023-02-09 DIAGNOSIS — Y830 Surgical operation with transplant of whole organ as the cause of abnormal reaction of the patient, or of later complication, without mention of misadventure at the time of the procedure: Secondary | ICD-10-CM | POA: Diagnosis not present

## 2023-02-09 DIAGNOSIS — Z794 Long term (current) use of insulin: Secondary | ICD-10-CM | POA: Diagnosis not present

## 2023-02-09 DIAGNOSIS — Z833 Family history of diabetes mellitus: Secondary | ICD-10-CM | POA: Diagnosis not present

## 2023-02-09 DIAGNOSIS — E1165 Type 2 diabetes mellitus with hyperglycemia: Secondary | ICD-10-CM

## 2023-02-09 DIAGNOSIS — Z7952 Long term (current) use of systemic steroids: Secondary | ICD-10-CM | POA: Diagnosis not present

## 2023-02-09 DIAGNOSIS — N179 Acute kidney failure, unspecified: Secondary | ICD-10-CM | POA: Diagnosis not present

## 2023-02-09 DIAGNOSIS — Z940 Kidney transplant status: Secondary | ICD-10-CM | POA: Diagnosis not present

## 2023-02-09 DIAGNOSIS — K219 Gastro-esophageal reflux disease without esophagitis: Secondary | ICD-10-CM | POA: Diagnosis present

## 2023-02-09 DIAGNOSIS — T8619 Other complication of kidney transplant: Secondary | ICD-10-CM | POA: Diagnosis present

## 2023-02-09 DIAGNOSIS — D84821 Immunodeficiency due to drugs: Secondary | ICD-10-CM | POA: Diagnosis present

## 2023-02-09 DIAGNOSIS — I10 Essential (primary) hypertension: Secondary | ICD-10-CM | POA: Diagnosis not present

## 2023-02-09 DIAGNOSIS — I5022 Chronic systolic (congestive) heart failure: Secondary | ICD-10-CM

## 2023-02-09 DIAGNOSIS — E861 Hypovolemia: Secondary | ICD-10-CM | POA: Diagnosis present

## 2023-02-09 DIAGNOSIS — E1122 Type 2 diabetes mellitus with diabetic chronic kidney disease: Secondary | ICD-10-CM

## 2023-02-09 DIAGNOSIS — E1100 Type 2 diabetes mellitus with hyperosmolarity without nonketotic hyperglycemic-hyperosmolar coma (NKHHC): Secondary | ICD-10-CM

## 2023-02-09 DIAGNOSIS — Z992 Dependence on renal dialysis: Secondary | ICD-10-CM

## 2023-02-09 DIAGNOSIS — R0602 Shortness of breath: Secondary | ICD-10-CM | POA: Diagnosis not present

## 2023-02-09 DIAGNOSIS — Z79621 Long term (current) use of calcineurin inhibitor: Secondary | ICD-10-CM | POA: Diagnosis not present

## 2023-02-09 DIAGNOSIS — E782 Mixed hyperlipidemia: Secondary | ICD-10-CM | POA: Diagnosis not present

## 2023-02-09 DIAGNOSIS — R9431 Abnormal electrocardiogram [ECG] [EKG]: Secondary | ICD-10-CM | POA: Diagnosis not present

## 2023-02-09 DIAGNOSIS — R5383 Other fatigue: Secondary | ICD-10-CM | POA: Diagnosis not present

## 2023-02-09 DIAGNOSIS — E1142 Type 2 diabetes mellitus with diabetic polyneuropathy: Secondary | ICD-10-CM

## 2023-02-09 LAB — COMPREHENSIVE METABOLIC PANEL
ALT: 37 U/L (ref 0–44)
AST: 20 U/L (ref 15–41)
Albumin: 4.5 g/dL (ref 3.5–5.0)
Alkaline Phosphatase: 154 U/L — ABNORMAL HIGH (ref 38–126)
Anion gap: 14 (ref 5–15)
BUN: 59 mg/dL — ABNORMAL HIGH (ref 6–20)
CO2: 27 mmol/L (ref 22–32)
Calcium: 10.7 mg/dL — ABNORMAL HIGH (ref 8.9–10.3)
Chloride: 83 mmol/L — ABNORMAL LOW (ref 98–111)
Creatinine, Ser: 2.49 mg/dL — ABNORMAL HIGH (ref 0.61–1.24)
GFR, Estimated: 31 mL/min — ABNORMAL LOW (ref >60)
Glucose, Bld: 735 mg/dL (ref 70–99)
Potassium: 4 mmol/L (ref 3.5–5.1)
Sodium: 124 mmol/L — ABNORMAL LOW (ref 135–145)
Total Bilirubin: 1.2 mg/dL (ref 0.3–1.2)
Total Protein: 8 g/dL (ref 6.5–8.1)

## 2023-02-09 LAB — CBC WITH DIFFERENTIAL/PLATELET
Abs Immature Granulocytes: 0.03 10*3/uL (ref 0.00–0.07)
Basophils Absolute: 0 10*3/uL (ref 0.0–0.1)
Basophils Relative: 1 %
Eosinophils Absolute: 0.1 10*3/uL (ref 0.0–0.5)
Eosinophils Relative: 2 %
HCT: 45.3 % (ref 39.0–52.0)
Hemoglobin: 14.9 g/dL (ref 13.0–17.0)
Immature Granulocytes: 1 %
Lymphocytes Relative: 19 %
Lymphs Abs: 1.1 10*3/uL (ref 0.7–4.0)
MCH: 26.1 pg (ref 26.0–34.0)
MCHC: 32.9 g/dL (ref 30.0–36.0)
MCV: 79.5 fL — ABNORMAL LOW (ref 80.0–100.0)
Monocytes Absolute: 0.7 10*3/uL (ref 0.1–1.0)
Monocytes Relative: 11 %
Neutro Abs: 4 10*3/uL (ref 1.7–7.7)
Neutrophils Relative %: 66 %
Platelets: 172 10*3/uL (ref 150–400)
RBC: 5.7 MIL/uL (ref 4.22–5.81)
RDW: 14.6 % (ref 11.5–15.5)
WBC: 6 10*3/uL (ref 4.0–10.5)
nRBC: 0 % (ref 0.0–0.2)

## 2023-02-09 LAB — I-STAT VENOUS BLOOD GAS, ED
Acid-Base Excess: 4 mmol/L — ABNORMAL HIGH (ref 0.0–2.0)
Bicarbonate: 30.1 mmol/L — ABNORMAL HIGH (ref 20.0–28.0)
Calcium, Ion: 1.25 mmol/L (ref 1.15–1.40)
HCT: 50 % (ref 39.0–52.0)
Hemoglobin: 17 g/dL (ref 13.0–17.0)
O2 Saturation: 28 %
Patient temperature: 98.5
Potassium: 4.1 mmol/L (ref 3.5–5.1)
Sodium: 124 mmol/L — ABNORMAL LOW (ref 135–145)
TCO2: 32 mmol/L (ref 22–32)
pCO2, Ven: 49.1 mmHg (ref 44–60)
pH, Ven: 7.395 (ref 7.25–7.43)
pO2, Ven: 19 mmHg — CL (ref 32–45)

## 2023-02-09 LAB — TROPONIN I (HIGH SENSITIVITY): Troponin I (High Sensitivity): 30 ng/L — ABNORMAL HIGH (ref <18)

## 2023-02-09 LAB — URINALYSIS, ROUTINE W REFLEX MICROSCOPIC
Bilirubin Urine: NEGATIVE
Glucose, UA: >1000 mg/dL — AB
Hgb urine dipstick: NEGATIVE
Leukocytes,Ua: NEGATIVE
Nitrite: NEGATIVE
Protein, ur: NEGATIVE mg/dL
Specific Gravity, Urine: 1.025 (ref 1.005–1.030)
pH: 5.5 (ref 5.0–8.0)

## 2023-02-09 LAB — CBG MONITORING, ED: Glucose-Capillary: >600 mg/dL (ref 70–99)

## 2023-02-09 LAB — BETA-HYDROXYBUTYRIC ACID: Beta-Hydroxybutyric Acid: 0.79 mmol/L — ABNORMAL HIGH (ref 0.05–0.27)

## 2023-02-09 LAB — BRAIN NATRIURETIC PEPTIDE: B Natriuretic Peptide: 40.9 pg/mL (ref 0.0–100.0)

## 2023-02-09 LAB — GLUCOSE, CAPILLARY: Glucose-Capillary: 514 mg/dL (ref 70–99)

## 2023-02-09 MED ORDER — ACETAMINOPHEN 650 MG RE SUPP: 650.0000 mg | Freq: Four times a day (QID) | RECTAL | Status: DC | PRN

## 2023-02-09 MED ORDER — DEXTROSE 50 % IV SOLN: 0.0000 mL | INTRAVENOUS | Status: DC | PRN

## 2023-02-09 MED ORDER — DEXTROSE IN LACTATED RINGERS 5 % IV SOLN: INTRAVENOUS | Status: DC

## 2023-02-09 MED ORDER — LACTATED RINGERS IV BOLUS
500.0000 mL | Freq: Once | INTRAVENOUS | Status: AC
  Administered 2023-02-09: 500 mL via INTRAVENOUS

## 2023-02-09 MED ORDER — ACETAMINOPHEN 325 MG PO TABS: 650.0000 mg | ORAL_TABLET | Freq: Four times a day (QID) | ORAL | Status: DC | PRN

## 2023-02-09 MED ORDER — MELATONIN 3 MG PO TABS: 3.0000 mg | ORAL_TABLET | Freq: Every evening | ORAL | Status: DC | PRN

## 2023-02-09 MED ORDER — ONDANSETRON HCL 4 MG/2ML IJ SOLN: 4.0000 mg | Freq: Four times a day (QID) | INTRAMUSCULAR | Status: DC | PRN

## 2023-02-09 MED ORDER — INSULIN REGULAR(HUMAN) IN NACL 100-0.9 UT/100ML-% IV SOLN
INTRAVENOUS | Status: DC
  Administered 2023-02-09: 11.5 [IU]/h via INTRAVENOUS
  Administered 2023-02-10: 7.5 [IU]/h via INTRAVENOUS
  Filled 2023-02-09 (×2): qty 100

## 2023-02-09 MED ORDER — LACTATED RINGERS IV SOLN: INTRAVENOUS | Status: DC

## 2023-02-09 NOTE — H&P (Signed)
History and Physical      Hector Mahoney ZOX:[id number] DOB: May 25, 1971 DOA: 02/09/2023; DOS: 02/09/2023  PCP: Corwin Levins, MD *** Patient coming from: home ***  I have personally briefly reviewed patient's old medical records in [facility] Link  Chief Complaint: ***  HPI: Hector Mahoney is a 52 y.o. male with medical history significant for *** who is admitted to [facility] on 02/09/2023 with *** after presenting from home*** to [facility] ED complaining of ***.   ***        ***  ED Course:  Vital signs in the ED were notable for the following: ***  Labs were notable for the following: ***  Per my interpretation, EKG in ED demonstrated the following:  ***  Imaging and additional notable ED work-up: ***  While in the ED, the following were administered: ***  Subsequently, the patient was admitted  ***  ***red   Review of Systems: As per HPI otherwise 10 point review of systems negative.   Past Medical History:  Diagnosis Date   Anemia    CHF (congestive heart failure) (HCC)    Esophageal reflux 04/27/2009   ESRD (end stage renal disease) on dialysis [facility])    home HD 4x/week   HYPERLIPIDEMIA 04/24/2007   HYPERTENSION 04/24/2007   Insulin dependent type 1 diabetes mellitus (HCC)     Past Surgical History:  Procedure Laterality Date   AV FISTULA PLACEMENT Left 06/04/2019   Procedure: BRACHIAL-CEPHALIC ARTERIOVENOUS (AV) FISTULA CREATION LEFT ARM;  Surgeon: Larina Earthly, MD;  Location: [facility] OR;  Service: Vascular;  Laterality: Left;   CAPD INSERTION N/A 08/24/2021   Procedure: LAPAROSCOPIC INSERTION CONTINUOUS AMBULATORY PERITONEAL DIALYSIS  (CAPD) CATHETER;  Surgeon: Leonie Douglas, MD;  Location: [facility] OR;  Service: Vascular;  Laterality: N/A;   COLONOSCOPY     polyp   IR FLUORO GUIDE CV LINE RIGHT  10/22/2017   IR THROMBECTOMY AV FISTULA W/THROMBOLYSIS/PTA/STENT INC/SHUNT/IMG LT Left 03/2021   IR US GUIDE VASC ACCESS RIGHT  10/22/2017   MOUTH  SURGERY     tooth ext    Social History:  reports that he quit smoking about 7 years ago. His smoking use included cigarettes. He has never used smokeless tobacco. He reports that he does not currently use alcohol. He reports that he does not use drugs.   Allergies  Allergen Reactions   Zinc     Family History  Problem Relation Age of Onset   Hypertension Mother    Diabetes Mother    Diabetes Maternal Aunt    Lung cancer Maternal Grandfather    Colon polyps Maternal Uncle    Colon cancer Maternal Uncle    Heart disease Maternal Uncle    Kidney disease Neg Hx    Esophageal cancer Neg Hx    Rectal cancer Neg Hx    Stomach cancer Neg Hx     Family history reviewed and not pertinent ***   Prior to Admission medications   Medication Sig Start Date End Date Taking? Authorizing Provider  amLODipine (NORVASC) 10 MG tablet Take 1 tablet by mouth daily. 10/29/21   [provider]  aspirin 81 MG EC tablet Take by mouth. 10/26/17   [provider]  atorvastatin (LIPITOR) 80 MG tablet Take 80 mg by mouth daily. 02/26/18   [provider]  AURYXIA 1 GM 210 MG(Fe) tablet Take 420 mg by mouth 3 (three) times daily. 04/04/21   [provider]  carvedilol (COREG) 25  MG tablet Take 1 tablet (25 mg total) by mouth 2 (two) times daily. 12/26/22 12/26/23  Corwin Levins, MD  cetirizine (ZYRTEC) 10 MG tablet Take 1 tablet by mouth once daily 04/18/21   Corwin Levins, MD  cinacalcet (SENSIPAR) 30 MG tablet Take 60 mg by mouth every evening.    [provider]  clonazePAM (KLONOPIN) 0.5 MG tablet TAKE 1/2 (ONE-HALF) TABLET BY MOUTH AT BEDTIME 12/11/21   Tat, Octaviano Batty, DO  cyclobenzaprine (FLEXERIL) 5 MG tablet Take 5 mg by mouth 3 (three) times daily as needed for muscle spasms.    [provider]  dapagliflozin propanediol (FARXIGA) 10 MG TABS tablet Take 10 mg by mouth daily.    [provider]  dicyclomine (BENTYL) 20 MG tablet Take 1 tablet  (20 mg total) by mouth in the morning and at bedtime. 04/02/22   Unk Lightning, PA  furosemide (LASIX) 80 MG tablet Take 80 mg by mouth 2 (two) times daily.    [provider]  HYDROcodone-acetaminophen (NORCO) 5-325 MG tablet Take 1 tablet by mouth every 6 (six) hours as needed for moderate pain. 08/24/21   Emilie Rutter, PA-C  insulin aspart (NOVOLOG) 100 UNIT/ML injection INJECT 8 TO 12 UNITS INTO THE SKIN THREE TIMES DAILY WITH MEALS Patient taking differently: Inject 30 Units into the skin 3 (three) times daily with meals. 10/10/20   Corwin Levins, MD  insulin glargine (LANTUS) 100 UNIT/ML injection Inject 0.4 mLs (40 Units total) into the skin at bedtime. Patient taking differently: Inject 50 Units into the skin at bedtime. 01/03/23   Shamleffer, Konrad Dolores, MD  levETIRAcetam (KEPPRA) 250 MG tablet Take 1 tablet (250 mg total) by mouth at bedtime. 04/20/21   Corwin Levins, MD  losartan (COZAAR) 50 MG tablet Take 1 tablet by mouth daily. 04/11/21   [provider]  Magnesium 400 MG CAPS Take 1 tablet by mouth in the morning and at bedtime.    [provider]  meclizine (ANTIVERT) 12.5 MG tablet TAKE 1 TABLET BY MOUTH THREE TIMES DAILY AS NEEDED FOR DIZZINESS 05/28/20   Corwin Levins, MD  metolazone (ZAROXOLYN) 5 MG tablet Take 5 mg by mouth daily.    [provider]  metoprolol succinate (TOPROL-XL) 25 MG 24 hr tablet Take 25 mg by mouth daily. 03/27/21   [provider]  multivitamin (RENA-VIT) TABS tablet Take 1 tablet by mouth daily. 03/20/19   [provider]  mycophenolate (MYFORTIC) 180 MG EC tablet Take 3 tablets by mouth 2 (two) times daily.    [provider]  NORVASC 10 MG tablet Take 10 mg by mouth at bedtime. 01/26/21   [provider]  omeprazole (PRILOSEC) 40 MG capsule Take 1 capsule (40 mg total) by mouth 2 (two) times daily. 04/19/21   Unk Lightning, PA  ondansetron (ZOFRAN) 4 MG tablet Take 1  tablet (4 mg total) by mouth every 8 (eight) hours as needed for nausea or vomiting. 12/20/20   Corwin Levins, MD  pioglitazone (ACTOS) 30 MG tablet Take 30 mg by mouth daily.    [provider]  polyethylene glycol powder (GLYCOLAX/MIRALAX) 17 GM/SCOOP powder Take 1 Container by mouth daily as needed for mild constipation. 10/25/17   [provider]  predniSONE (DELTASONE) 5 MG tablet Take 5 mg by mouth daily with breakfast.    [provider]  tacrolimus (PROGRAF) 1 MG capsule Take 1 mg by mouth 2 (two) times daily.  [provider]  tamsulosin (FLOMAX) 0.4 MG CAPS capsule Take 0.4 mg by mouth.    [provider]  triamcinolone (NASACORT) 55 MCG/ACT AERO nasal inhaler Place 2 sprays into the nose daily. 05/21/20   McGowen, Maryjean Morn, MD  Zinc 50 MG TABS Take 1 tablet by mouth daily at 6 (six) AM.    [provider]     Objective    Physical Exam: Vitals:   02/09/23 1828 02/09/23 2045 02/09/23 2200 02/09/23 2206  BP: (!) 143/89   117/82  Pulse: 98 97 (!) 101 (!) 101  Resp: 18 13  17   Temp: 98.5 F (36.9 C)  98.8 F (37.1 C)   TempSrc: Oral  Oral   SpO2: 100% 96%  93%  Weight:   99.5 kg   Height:   5\' 6"  (1.676 m)     General: appears to be stated age; alert, oriented Skin: warm, dry, no rash Head:  AT/[place] Mouth:  Oral mucosa membranes appear moist, normal dentition Neck: supple; trachea midline Heart:  RRR; did not appreciate any M/R/G Lungs: CTAB, did not appreciate any wheezes, rales, or rhonchi Abdomen: + BS; soft, ND, NT Vascular: 2+ pedal pulses b/l; 2+ radial pulses b/l Extremities: no peripheral edema, no muscle wasting Neuro: strength and sensation intact in upper and lower extremities b/l    *** Neuro: 5/5 strength of the proximal and distal flexors and extensors of the upper and lower extremities bilaterally; sensation intact in upper and lower extremities b/l; cranial nerves II through XII grossly intact; no  pronator drift; no evidence suggestive of slurred speech, dysarthria, or facial droop; Normal muscle tone. No tremors. *** Neuro: In the setting of the patient's current mental status and associated inability to follow instructions, unable to perform full neurologic exam at this time.  As such, assessment of strength, sensation, and cranial nerves is limited at this time. Patient noted to spontaneously move all 4 extremities. No tremors.  ***    Labs on Admission: I have personally reviewed following labs and imaging studies  CBC: Recent Labs  Lab 02/09/23 1907  WBC 6.0  NEUTROABS 4.0  HGB 17.0  14.9  HCT 50.0  45.3  MCV 79.5*  PLT 172   Basic Metabolic Panel: Recent Labs  Lab 02/09/23 1907  NA 124*  124*  K 4.0  4.1  CL 83*  CO2 27  GLUCOSE 735*  BUN 59*  CREATININE 2.49*  CALCIUM 10.7*   GFR: Estimated Creatinine Clearance: 39.2 mL/min (A) (by C-G formula based on SCr of 2.49 mg/dL (H)). Liver Function Tests: Recent Labs  Lab 02/09/23 1907  AST 20  ALT 37  ALKPHOS 154*  BILITOT 1.2  PROT 8.0  ALBUMIN 4.5   No results for input(s): "LIPASE", "AMYLASE" in the last 168 hours. No results for input(s): "AMMONIA" in the last 168 hours. Coagulation Profile: No results for input(s): "INR", "PROTIME" in the last 168 hours. Cardiac Enzymes: No results for input(s): "CKTOTAL", "CKMB", "CKMBINDEX", "TROPONINI" in the last 168 hours. BNP (last 3 results) No results for input(s): "PROBNP" in the last 8760 hours. HbA1C: No results for input(s): "HGBA1C" in the last 72 hours. CBG: Recent Labs  Lab 02/09/23 1923  GLUCAP >600*   Lipid Profile: No results for input(s): "CHOL", "HDL", "LDLCALC", "TRIG", "CHOLHDL", "LDLDIRECT" in the last 72 hours. Thyroid Function Tests: No results for input(s): "TSH", "T4TOTAL", "FREET4", "T3FREE", "THYROIDAB" in the last 72 hours. Anemia Panel: No results for input(s): "VITAMINB12", "FOLATE", "FERRITIN", "TIBC", "  IRON",  "RETICCTPCT" in the last 72 hours. Urine analysis:    Component Value Date/Time   COLORURINE COLORLESS (A) 02/09/2023 1929   APPEARANCEUR CLEAR 02/09/2023 1929   LABSPEC 1.025 02/09/2023 1929   PHURINE 5.5 02/09/2023 1929   GLUCOSEU >1,000 (A) 02/09/2023 1929   GLUCOSEU >=1000 04/27/2009 1531   HGBUR NEGATIVE 02/09/2023 1929   HGBUR trace-intact 04/23/2009 0936   BILIRUBINUR NEGATIVE 02/09/2023 1929   KETONESUR TRACE (A) 02/09/2023 1929   PROTEINUR NEGATIVE 02/09/2023 1929   UROBILINOGEN 0.2 03/29/2014 1729   NITRITE NEGATIVE 02/09/2023 1929   LEUKOCYTESUR NEGATIVE 02/09/2023 1929    Radiological Exams on Admission: DG Chest Portable 1 View  Result Date: 02/09/2023 CLINICAL DATA:  SOB EXAM: PORTABLE CHEST - 1 VIEW COMPARISON:  02/14/2021 FINDINGS: Lungs are clear. Heart size and mediastinal contours are within normal limits. No effusion. Visualized bones unremarkable. IMPRESSION: No acute cardiopulmonary disease. Electronically Signed   By: Corlis Leak M.D.   On: 02/09/2023 19:39      Assessment/Plan    Principal Problem:   Hyperosmolar hyperglycemic state (HHS) (HCC)  ***      ***          ***               ***              ***              ***              ***              ***              ***              ***              ***              ***              ***              ***     DVT prophylaxis: SCD's ***  Code Status: Full code*** Family Communication: none*** Disposition Plan: Per Rounding Team Consults called: none***;  Admission status: ***    I SPENT GREATER THAN 75 *** MINUTES IN CLINICAL CARE TIME/MEDICAL DECISION-MAKING IN COMPLETING THIS ADMISSION.     Chaney Born Audel Coakley DO [facility] From 7PM - 7AM   02/09/2023, 10:12 PM   ***

## 2023-02-09 NOTE — Progress Notes (Signed)
Plan of Care Note for accepted transfer   Patient: Hector Mahoney MRN: [id number]   DOA: 02/09/2023  Facility requesting transfer: [facility]   Requesting Provider: Dr. Dalene Seltzer   Reason for transfer: HHS   Facility course: 52 yr old man with HTN, IDDM, HFpEF, and ESRD s/p renal transplant who p/w fatigue and is found to have serum glucose with normal serum bicarbonate and anion gap.   SCr is 2.49, up from 1.15 on June 3rd.   He was given 500 mL of LR and started on IV insulin infusion.   Plan of care: The patient is accepted for admission to [facility] unit, at [facility].   Author: Briscoe Deutscher, MD 02/09/2023  Check [email or web address] for on-call coverage.  Nursing staff, Please call [facility] Admits & Consults System-Wide number on Amion as soon as patient's arrival, so appropriate admitting provider can evaluate the pt.

## 2023-02-09 NOTE — ED Provider Notes (Signed)
[place] EMERGENCY DEPARTMENT AT [facility] Provider Note   CSN: [id number] Arrival date & time: 02/09/23  1819     History  Chief Complaint  Patient presents with   Dehydration    Hector Mahoney is a 52 y.o. male.  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    He had symptoms of fluid overload about 3 weeks ago with swelling in the upper extremities, was discussed with his transplant team at [facility] and started on metolazone 5 mg daily in addition to his baseline Lasix 80 mg twice daily, lost about 17 pounds of fluid per his scale with improvement of his swelling. Wife became concerned reading side effects and he was feeling sick so she stopped the metolazone. Initially they encouraged him to decrease carvedilol and still take it but he was not feeling well and they decided not to except for one day this week. He has been taking his lasix still.  Over the past few days he has had more fatigue, generalized weakness.  He is worried he is dehydrated.  He has had some intermittent abdominal cramping.   Now taking 80mg  lasix BID, not on the metolazole. Went to local dr, found weight increased, called [facility] and took metolazone for one week.  Started feeling better after stopping it. Discussed over phone and they wanted him to take it.  Weight is normal.  Concern for dehydration, did take it once this week.   Was 230 before, looked fluid overloaded and was down to 216, 216-220 Is normal weight. Not having additional swelling. No dyspnea, just fatigue. No chest pain.   No nausea or vomiting.  Occasional cramping of abdomen for about 2 days, worse 7/10.  No diarrhea or constipation. No fever.  A long time ago had similar discomfort with GERD but feels it  right in middle.  No difficulty udrinating or pain.  No change in glucose control meds or diet.    Labs completed at the urgent care show sodium of 124 with a glucose of 692, anion gap of 15, bicarb of 25, creatinine of 2.41 which was previously 1.15 on June 3.  Past Medical History:  Diagnosis Date   Anemia    CHF (congestive heart failure) (HCC)    Esophageal reflux 04/27/2009   ESRD (end stage renal disease) on dialysis [facility])    home HD 4x/week   HYPERLIPIDEMIA 04/24/2007   HYPERTENSION 04/24/2007   Insulin dependent type 1 diabetes mellitus (HCC)      Home Medications Prior to Admission medications   Medication Sig Start Date End Date Taking? Authorizing Provider  amLODipine (NORVASC) 10 MG tablet Take 1 tablet by mouth daily. 10/29/21   [provider]  aspirin 81 MG EC tablet Take by mouth. 10/26/17   [provider]  atorvastatin (LIPITOR) 80 MG tablet Take 80 mg by mouth daily. 02/26/18   [provider]  AURYXIA 1 GM 210 MG(Fe) tablet Take 420 mg by mouth 3 (three) times daily. 04/04/21   [provider]  carvedilol (COREG) 25 MG tablet Take 1 tablet (25 mg total) by mouth 2 (two) times daily. 12/26/22 12/26/23  Corwin Levins, MD  cetirizine (ZYRTEC) 10 MG tablet Take 1 tablet by mouth once daily 04/18/21   Corwin Levins, MD  cinacalcet (SENSIPAR) 30 MG tablet Take 60 mg by mouth every evening.    [provider]  clonazePAM (KLONOPIN) 0.5 MG tablet TAKE 1/2 (ONE-HALF) TABLET BY MOUTH AT BEDTIME 12/11/21   Tat, Octaviano Batty, DO  cyclobenzaprine (FLEXERIL) 5 MG tablet Take 5 mg by mouth 3 (three) times daily as needed for muscle spasms.    [provider]  dapagliflozin propanediol (FARXIGA) 10 MG TABS tablet Take 10 mg by mouth daily.    [provider]  dicyclomine (BENTYL) 20 MG tablet Take 1 tablet (20 mg total) by mouth in the morning and at bedtime. 04/02/22   Unk Lightning, PA  furosemide (LASIX) 80 MG tablet Take  80 mg by mouth 2 (two) times daily.    [provider]  HYDROcodone-acetaminophen (NORCO) 5-325 MG tablet Take 1 tablet by mouth every 6 (six) hours as needed for moderate pain. 08/24/21   Emilie Rutter, PA-C  insulin aspart (NOVOLOG) 100 UNIT/ML injection INJECT 8 TO 12 UNITS INTO THE SKIN THREE TIMES DAILY WITH MEALS Patient taking differently: Inject 30 Units into the skin 3 (three) times daily with meals. 10/10/20   Corwin Levins, MD  insulin glargine (LANTUS) 100 UNIT/ML injection Inject 0.4 mLs (40 Units total) into the skin at bedtime. Patient taking differently: Inject 50 Units into the skin at bedtime. 01/03/23   Shamleffer, Konrad Dolores, MD  levETIRAcetam (KEPPRA) 250 MG tablet Take 1 tablet (250 mg total) by mouth at bedtime. 04/20/21   Corwin Levins, MD  losartan (COZAAR) 50 MG tablet Take 1 tablet by mouth daily. 04/11/21   [provider]  Magnesium 400 MG CAPS Take 1 tablet by mouth in the morning and at bedtime.    [provider]  meclizine (ANTIVERT) 12.5 MG tablet TAKE 1 TABLET BY MOUTH THREE TIMES DAILY AS NEEDED FOR DIZZINESS 05/28/20   Corwin Levins, MD  metolazone (ZAROXOLYN) 5 MG tablet Take 5 mg by mouth daily.    [provider]  metoprolol succinate (TOPROL-XL) 25 MG 24 hr tablet Take 25 mg by mouth daily. 03/27/21   [provider]  multivitamin (RENA-VIT) TABS tablet Take 1 tablet by mouth daily. 03/20/19   [provider]  mycophenolate (MYFORTIC) 180 MG EC tablet Take 3 tablets by mouth 2 (two) times daily.    [provider]  NORVASC 10 MG tablet Take 10 mg by mouth at bedtime. 01/26/21   [provider]  omeprazole (PRILOSEC) 40 MG capsule Take 1 capsule (40 mg total) by mouth 2 (two) times daily. 04/19/21   Unk Lightning, PA  ondansetron (ZOFRAN) 4 MG tablet Take 1 tablet (4 mg total) by mouth every 8 (eight) hours as needed for nausea or vomiting. 12/20/20   Corwin Levins, MD  pioglitazone  (ACTOS) 30 MG tablet Take 30 mg by mouth daily.    [provider]  polyethylene glycol powder (GLYCOLAX/MIRALAX) 17 GM/SCOOP powder Take 1 Container by mouth daily as needed for mild constipation. 10/25/17   [provider]  predniSONE (DELTASONE) 5 MG tablet Take 5 mg by mouth daily with breakfast.    [provider]  tacrolimus (PROGRAF) 1 MG capsule Take 1 mg by mouth 2 (two) times daily.    [provider]  tamsulosin (FLOMAX) 0.4 MG CAPS capsule Take 0.4 mg by mouth.    [provider]  triamcinolone (NASACORT) 55 MCG/ACT AERO nasal inhaler Place 2 sprays into the nose daily. 05/21/20   McGowen,  Maryjean Morn, MD  Zinc 50 MG TABS Take 1 tablet by mouth daily at 6 (six) AM.    [provider]      Allergies    Zinc    Review of Systems   Review of Systems  Physical Exam Updated Vital Signs BP (!) 143/89 (BP Location: Right Arm)   Pulse 98   Temp 98.5 F (36.9 C) (Oral)   Resp 18   SpO2 100%  Physical Exam Vitals and nursing note reviewed.  Constitutional:      General: He is not in acute distress.    Appearance: He is well-developed. He is not diaphoretic.  HENT:     Head: Normocephalic and atraumatic.  Eyes:     Conjunctiva/sclera: Conjunctivae normal.  Cardiovascular:     Rate and Rhythm: Normal rate and regular rhythm.     Heart sounds: Normal heart sounds. No murmur heard.    No friction rub. No gallop.  Pulmonary:     Effort: Pulmonary effort is normal. No respiratory distress.     Breath sounds: Normal breath sounds. No wheezing or rales.  Abdominal:     General: There is no distension.     Palpations: Abdomen is soft.     Tenderness: There is no abdominal tenderness. There is no guarding.  Musculoskeletal:     Cervical back: Normal range of motion.     Right lower leg: No edema.     Left lower leg: No edema.  Skin:    General: Skin is warm and dry.  Neurological:     Mental Status: He is alert and oriented to  person, place, and time.     ED Results / Procedures / Treatments   Labs (all labs ordered are listed, but only abnormal results are displayed) Labs Reviewed  CBC WITH DIFFERENTIAL/PLATELET - Abnormal; Notable for the following components:      Result Value   MCV 79.5 (*)    All other components within normal limits  COMPREHENSIVE METABOLIC PANEL - Abnormal; Notable for the following components:   Sodium 124 (*)    Chloride 83 (*)    Glucose, Bld 735 (*)    BUN 59 (*)    Creatinine, Ser 2.49 (*)    Calcium 10.7 (*)    Alkaline Phosphatase 154 (*)    GFR, Estimated 31 (*)    All other components within normal limits  URINALYSIS, ROUTINE W REFLEX MICROSCOPIC - Abnormal; Notable for the following components:   Color, Urine COLORLESS (*)    Glucose, UA >1,000 (*)    Ketones, ur TRACE (*)    Bacteria, UA RARE (*)    All other components within normal limits  I-STAT VENOUS BLOOD GAS, ED - Abnormal; Notable for the following components:   pO2, Ven 19 (*)    Bicarbonate 30.1 (*)    Acid-Base Excess 4.0 (*)    Sodium 124 (*)    All other components within normal limits  CBG MONITORING, ED - Abnormal; Notable for the following components:   Glucose-Capillary >600 (*)    All other components within normal limits  TROPONIN I (HIGH SENSITIVITY) - Abnormal; Notable for the following components:   Troponin I (High Sensitivity) 30 (*)    All other components within normal limits  BRAIN NATRIURETIC PEPTIDE  BETA-HYDROXYBUTYRIC ACID  TACROLIMUS LEVEL  CBG MONITORING, ED  TROPONIN I (HIGH SENSITIVITY)    EKG EKG Interpretation Date/Time:  Saturday February 09 2023 18:32:44 EDT Ventricular Rate:  98  PR Interval:  208 QRS Duration:  84 QT Interval:  354 QTC Calculation: 451 R Axis:   -64  Text Interpretation: Normal sinus rhythm Left axis deviation Inferior infarct , age undetermined Anteroseptal infarct , age undetermined T wave abnormality, consider lateral ischemia Abnormal ECG  When compared with ECG of 23-Nov-2021 10:09, Artifact, findings consistent with LVH Confirmed by Alvira Monday ([id number]) on 02/09/2023 6:44:41 PM  Radiology DG Chest Portable 1 View  Result Date: 02/09/2023 CLINICAL DATA:  SOB EXAM: PORTABLE CHEST - 1 VIEW COMPARISON:  02/14/2021 FINDINGS: Lungs are clear. Heart size and mediastinal contours are within normal limits. No effusion. Visualized bones unremarkable. IMPRESSION: No acute cardiopulmonary disease. Electronically Signed   By: Corlis Leak M.D.   On: 02/09/2023 19:39    Procedures Procedures    Medications Ordered in ED Medications  insulin regular, human (MYXREDLIN) 100 units/ 100 mL infusion (has no administration in time range)  lactated ringers infusion (has no administration in time range)  dextrose 5 % in lactated ringers infusion (has no administration in time range)  dextrose 50 % solution 0-50 mL (has no administration in time range)  lactated ringers bolus 500 mL (has no administration in time range)    ED Course/ Medical Decision Making/ A&P                               52 year old male with history of type 1 diabetes, hypertension, chronic diastolic heart failure, 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  Reviewed urgent care labs in comparison to his prior which were significant for acute kidney injury, concern for hyperglycemia and anion gap.  On arrival to the emergency department here, ordered VBG, beta hydroxybutyric acid, repeat of CBC CMP and urinalysis.  He reports intermittent mild cramping pain, but does not have any significant abdominal pain, no tenderness on exam, have low suspicion for acute intra-abdominal process or surgical emergency.  Had reported dyspnea at urgent care, on my history describes it more as a fatigue, however given his heart failure history and recent diuretic  changes, ordered chest x-ray, troponin, BNP which are completed and personally evaluated interpreted by me which showed no evidence of pulmonary edema, pneumonia on chest x-ray, BNP within normal limits.  Troponin is mildly elevated, which may be in setting of renal disease, however will recheck in setting of diabetes and fatigue.  EKG shows findings consistent with LVH.  Labs completed and personally about interpreted by me show no leukocytosis, no anemia, CMP with hyperglycemia with a normal bicarb, no anion gap, pH within normal limits, no signs of DKA. Not consistent with HHS. CMP shows a creatinine of 2.49 is increased from 1.13 a month ago.   Suspect AKI secondary to dehydration in setting of addition of metolazone as an outpatient, and hyperglycemia.  Given 500 cc of fluid, with 125 an hour continuing after in the setting of his hyperglycemia.  Ordered insulin drip for hyperglycemia.  Ordered tacrolimus level in setting of AKI.         Final Clinical Impression(s) / ED Diagnoses Final diagnoses:  AKI (acute kidney injury) (HCC)  History of renal transplant  Hyperglycemia    Rx / DC Orders ED Discharge Orders     None         Alvira Monday, MD 02/09/23 2025

## 2023-02-09 NOTE — ED Triage Notes (Signed)
Was on extra diuretic, stopped Wednesday.  Spoke with his docs advised UC. Went to [facility], saw EKG changes and concern for AKI in transplant patient so sent to be evaluated in ed

## 2023-02-09 NOTE — ED Notes (Signed)
RT ran VBG on pt w/the following results. MD notified of any critical values.   Latest Reference Range & Units 12/22/20 08:22  Sample type  VENOUS  pH, Ven 7.250 - 7.430  7.373  pCO2, Ven 44.0 - 60.0 mmHg 53.5  pO2, Ven 32.0 - 45.0 mmHg 105.0 (H)  TCO2 22 - 32 mmol/L 33 (H)  Acid-Base Excess 0.0 - 2.0 mmol/L 5.0 (H)  Bicarbonate 20.0 - 28.0 mmol/L 31.1 (H)  O2 Saturation % 98.0  (H): Data is abnormally high

## 2023-02-09 NOTE — ED Notes (Signed)
RN notified- Critical CBG

## 2023-02-10 ENCOUNTER — Inpatient Hospital Stay (HOSPITAL_COMMUNITY): Payer: Federal, State, Local not specified - PPO

## 2023-02-10 ENCOUNTER — Encounter (HOSPITAL_COMMUNITY): Payer: Self-pay | Admitting: Family Medicine

## 2023-02-10 DIAGNOSIS — I10 Essential (primary) hypertension: Secondary | ICD-10-CM | POA: Diagnosis not present

## 2023-02-10 DIAGNOSIS — E860 Dehydration: Secondary | ICD-10-CM | POA: Diagnosis present

## 2023-02-10 DIAGNOSIS — R531 Weakness: Secondary | ICD-10-CM

## 2023-02-10 DIAGNOSIS — R9431 Abnormal electrocardiogram [ECG] [EKG]: Secondary | ICD-10-CM | POA: Diagnosis not present

## 2023-02-10 DIAGNOSIS — E1100 Type 2 diabetes mellitus with hyperosmolarity without nonketotic hyperglycemic-hyperosmolar coma (NKHHC): Secondary | ICD-10-CM | POA: Diagnosis not present

## 2023-02-10 DIAGNOSIS — N179 Acute kidney failure, unspecified: Secondary | ICD-10-CM | POA: Diagnosis not present

## 2023-02-10 DIAGNOSIS — I5022 Chronic systolic (congestive) heart failure: Secondary | ICD-10-CM | POA: Diagnosis present

## 2023-02-10 DIAGNOSIS — N19 Unspecified kidney failure: Secondary | ICD-10-CM | POA: Diagnosis present

## 2023-02-10 DIAGNOSIS — I444 Left anterior fascicular block: Secondary | ICD-10-CM | POA: Diagnosis not present

## 2023-02-10 DIAGNOSIS — R739 Hyperglycemia, unspecified: Secondary | ICD-10-CM | POA: Diagnosis present

## 2023-02-10 LAB — BASIC METABOLIC PANEL
Anion gap: 12 (ref 5–15)
Anion gap: 12 (ref 5–15)
Anion gap: 8 (ref 5–15)
BUN: 56 mg/dL — ABNORMAL HIGH (ref 6–20)
BUN: 53 mg/dL — ABNORMAL HIGH (ref 6–20)
BUN: 48 mg/dL — ABNORMAL HIGH (ref 6–20)
CO2: 26 mmol/L (ref 22–32)
CO2: 23 mmol/L (ref 22–32)
CO2: 24 mmol/L (ref 22–32)
Calcium: 10.3 mg/dL (ref 8.9–10.3)
Calcium: 10.2 mg/dL (ref 8.9–10.3)
Calcium: 9.6 mg/dL (ref 8.9–10.3)
Chloride: 94 mmol/L — ABNORMAL LOW (ref 98–111)
Chloride: 97 mmol/L — ABNORMAL LOW (ref 98–111)
Chloride: 99 mmol/L (ref 98–111)
Creatinine, Ser: 2.11 mg/dL — ABNORMAL HIGH (ref 0.61–1.24)
Creatinine, Ser: 1.92 mg/dL — ABNORMAL HIGH (ref 0.61–1.24)
Creatinine, Ser: 1.83 mg/dL — ABNORMAL HIGH (ref 0.61–1.24)
GFR, Estimated: 37 mL/min — ABNORMAL LOW (ref >60)
GFR, Estimated: 42 mL/min — ABNORMAL LOW (ref >60)
GFR, Estimated: 44 mL/min — ABNORMAL LOW (ref >60)
Glucose, Bld: 327 mg/dL — ABNORMAL HIGH (ref 70–99)
Glucose, Bld: 176 mg/dL — ABNORMAL HIGH (ref 70–99)
Glucose, Bld: 380 mg/dL — ABNORMAL HIGH (ref 70–99)
Potassium: 2.9 mmol/L — ABNORMAL LOW (ref 3.5–5.1)
Potassium: 3.3 mmol/L — ABNORMAL LOW (ref 3.5–5.1)
Potassium: 3.7 mmol/L (ref 3.5–5.1)
Sodium: 132 mmol/L — ABNORMAL LOW (ref 135–145)
Sodium: 132 mmol/L — ABNORMAL LOW (ref 135–145)
Sodium: 131 mmol/L — ABNORMAL LOW (ref 135–145)

## 2023-02-10 LAB — MAGNESIUM
Magnesium: 2.5 mg/dL — ABNORMAL HIGH (ref 1.7–2.4)
Magnesium: 2.4 mg/dL (ref 1.7–2.4)

## 2023-02-10 LAB — BASIC METABOLIC PANEL WITH GFR
Anion gap: 11 (ref 5–15)
BUN: 49 mg/dL — ABNORMAL HIGH (ref 6–20)
CO2: 23 mmol/L (ref 22–32)
Calcium: 10 mg/dL (ref 8.9–10.3)
Chloride: 97 mmol/L — ABNORMAL LOW (ref 98–111)
Creatinine, Ser: 1.86 mg/dL — ABNORMAL HIGH (ref 0.61–1.24)
GFR, Estimated: 44 mL/min — ABNORMAL LOW
Glucose, Bld: 167 mg/dL — ABNORMAL HIGH (ref 70–99)
Potassium: 3.5 mmol/L (ref 3.5–5.1)
Sodium: 131 mmol/L — ABNORMAL LOW (ref 135–145)

## 2023-02-10 LAB — COMPREHENSIVE METABOLIC PANEL
ALT: 39 U/L (ref 0–44)
AST: 22 U/L (ref 15–41)
Albumin: 4.1 g/dL (ref 3.5–5.0)
Alkaline Phosphatase: 139 U/L — ABNORMAL HIGH (ref 38–126)
Anion gap: 12 (ref 5–15)
BUN: 56 mg/dL — ABNORMAL HIGH (ref 6–20)
CO2: 25 mmol/L (ref 22–32)
Calcium: 10.3 mg/dL (ref 8.9–10.3)
Chloride: 93 mmol/L — ABNORMAL LOW (ref 98–111)
Creatinine, Ser: 2.12 mg/dL — ABNORMAL HIGH (ref 0.61–1.24)
GFR, Estimated: 37 mL/min — ABNORMAL LOW (ref >60)
Glucose, Bld: 324 mg/dL — ABNORMAL HIGH (ref 70–99)
Potassium: 2.8 mmol/L — ABNORMAL LOW (ref 3.5–5.1)
Sodium: 130 mmol/L — ABNORMAL LOW (ref 135–145)
Total Bilirubin: 0.8 mg/dL (ref 0.3–1.2)
Total Protein: 7.7 g/dL (ref 6.5–8.1)

## 2023-02-10 LAB — CBC WITH DIFFERENTIAL/PLATELET
Abs Immature Granulocytes: 0.06 K/uL (ref 0.00–0.07)
Basophils Absolute: 0 K/uL (ref 0.0–0.1)
Basophils Relative: 0 %
Eosinophils Absolute: 0.2 K/uL (ref 0.0–0.5)
Eosinophils Relative: 3 %
HCT: 44.9 % (ref 39.0–52.0)
Hemoglobin: 14.7 g/dL (ref 13.0–17.0)
Immature Granulocytes: 1 %
Lymphocytes Relative: 16 %
Lymphs Abs: 1.1 K/uL (ref 0.7–4.0)
MCH: 26 pg (ref 26.0–34.0)
MCHC: 32.7 g/dL (ref 30.0–36.0)
MCV: 79.3 fL — ABNORMAL LOW (ref 80.0–100.0)
Monocytes Absolute: 0.7 K/uL (ref 0.1–1.0)
Monocytes Relative: 10 %
Neutro Abs: 4.8 K/uL (ref 1.7–7.7)
Neutrophils Relative %: 70 %
Platelets: 191 K/uL (ref 150–400)
RBC: 5.66 MIL/uL (ref 4.22–5.81)
RDW: 13.9 % (ref 11.5–15.5)
WBC: 6.8 K/uL (ref 4.0–10.5)
nRBC: 0 % (ref 0.0–0.2)

## 2023-02-10 LAB — GLUCOSE, CAPILLARY
Glucose-Capillary: 157 mg/dL — ABNORMAL HIGH (ref 70–99)
Glucose-Capillary: 166 mg/dL — ABNORMAL HIGH (ref 70–99)
Glucose-Capillary: 181 mg/dL — ABNORMAL HIGH (ref 70–99)
Glucose-Capillary: 182 mg/dL — ABNORMAL HIGH (ref 70–99)
Glucose-Capillary: 185 mg/dL — ABNORMAL HIGH (ref 70–99)
Glucose-Capillary: 187 mg/dL — ABNORMAL HIGH (ref 70–99)
Glucose-Capillary: 201 mg/dL — ABNORMAL HIGH (ref 70–99)
Glucose-Capillary: 203 mg/dL — ABNORMAL HIGH (ref 70–99)
Glucose-Capillary: 211 mg/dL — ABNORMAL HIGH (ref 70–99)
Glucose-Capillary: 227 mg/dL — ABNORMAL HIGH (ref 70–99)
Glucose-Capillary: 246 mg/dL — ABNORMAL HIGH (ref 70–99)
Glucose-Capillary: 294 mg/dL — ABNORMAL HIGH (ref 70–99)
Glucose-Capillary: 346 mg/dL — ABNORMAL HIGH (ref 70–99)
Glucose-Capillary: 370 mg/dL — ABNORMAL HIGH (ref 70–99)
Glucose-Capillary: 410 mg/dL — ABNORMAL HIGH (ref 70–99)
Glucose-Capillary: 457 mg/dL — ABNORMAL HIGH (ref 70–99)
Glucose-Capillary: 460 mg/dL — ABNORMAL HIGH (ref 70–99)
Glucose-Capillary: 488 mg/dL — ABNORMAL HIGH (ref 70–99)
Glucose-Capillary: 492 mg/dL — ABNORMAL HIGH (ref 70–99)
Glucose-Capillary: 525 mg/dL (ref 70–99)

## 2023-02-10 LAB — ECHOCARDIOGRAM COMPLETE
Area-P 1/2: 2.33 cm2
Calc EF: 60.2 %
Height: 66 in
S' Lateral: 2.4 cm
Single Plane A2C EF: 60.5 %
Single Plane A4C EF: 62 %
Weight: 3594.38 [oz_av]

## 2023-02-10 LAB — MRSA NEXT GEN BY PCR, NASAL: MRSA by PCR Next Gen: NOT DETECTED

## 2023-02-10 LAB — TROPONIN I (HIGH SENSITIVITY): Troponin I (High Sensitivity): 33 ng/L — ABNORMAL HIGH (ref <18)

## 2023-02-10 LAB — CK: Total CK: 169 U/L (ref 49–397)

## 2023-02-10 LAB — PHOSPHORUS: Phosphorus: 2.4 mg/dL — ABNORMAL LOW (ref 2.5–4.6)

## 2023-02-10 LAB — PROCALCITONIN: Procalcitonin: 0.11 ng/mL

## 2023-02-10 LAB — OSMOLALITY: Osmolality: 308 mOsm/kg — ABNORMAL HIGH (ref 275–295)

## 2023-02-10 LAB — VITAMIN B12: Vitamin B-12: 2940 pg/mL — ABNORMAL HIGH (ref 180–914)

## 2023-02-10 LAB — TSH: TSH: 0.869 u[IU]/mL (ref 0.350–4.500)

## 2023-02-10 MED ORDER — INSULIN ASPART 100 UNIT/ML IJ SOLN
0.0000 [IU] | Freq: Three times a day (TID) | INTRAMUSCULAR | Status: DC
  Administered 2023-02-10: 2 [IU] via SUBCUTANEOUS

## 2023-02-10 MED ORDER — INSULIN DETEMIR 100 UNIT/ML ~~LOC~~ SOLN
40.0000 [IU] | Freq: Every day | SUBCUTANEOUS | Status: DC
  Administered 2023-02-10: 40 [IU] via SUBCUTANEOUS
  Filled 2023-02-10 (×2): qty 0.4

## 2023-02-10 MED ORDER — ASPIRIN 81 MG PO TBEC: 81.0000 mg | DELAYED_RELEASE_TABLET | Freq: Every day | ORAL | Status: DC

## 2023-02-10 MED ORDER — SODIUM CHLORIDE 0.9 % IV BOLUS
500.0000 mL | Freq: Once | INTRAVENOUS | Status: AC
  Administered 2023-02-10: 500 mL via INTRAVENOUS

## 2023-02-10 MED ORDER — SODIUM CHLORIDE 0.9 % IV SOLN: INTRAVENOUS | Status: DC

## 2023-02-10 MED ORDER — SODIUM CHLORIDE 0.45 % IV SOLN
INTRAVENOUS | Status: DC
  Filled 2023-02-10: qty 1000

## 2023-02-10 MED ORDER — INSULIN ASPART 100 UNIT/ML IJ SOLN
10.0000 [IU] | Freq: Once | INTRAMUSCULAR | Status: AC
  Administered 2023-02-10: 10 [IU] via SUBCUTANEOUS

## 2023-02-10 MED ORDER — TACROLIMUS 1 MG PO CAPS: 1.0000 mg | ORAL_CAPSULE | Freq: Two times a day (BID) | ORAL | Status: DC

## 2023-02-10 MED ORDER — POTASSIUM CHLORIDE 10 MEQ/100ML IV SOLN
10.0000 meq | INTRAVENOUS | Status: AC
  Administered 2023-02-10 (×2): 10 meq via INTRAVENOUS
  Filled 2023-02-10 (×2): qty 100

## 2023-02-10 MED ORDER — INSULIN ASPART 100 UNIT/ML IJ SOLN
0.0000 [IU] | Freq: Every day | INTRAMUSCULAR | Status: DC
  Administered 2023-02-11: 2 [IU] via SUBCUTANEOUS

## 2023-02-10 MED ORDER — TRIAMCINOLONE ACETONIDE 55 MCG/ACT NA AERO: 2.0000 | INHALATION_SPRAY | Freq: Every day | NASAL | Status: DC

## 2023-02-10 MED ORDER — CINACALCET HCL 30 MG PO TABS: 60.0000 mg | ORAL_TABLET | Freq: Every evening | ORAL | Status: DC

## 2023-02-10 MED ORDER — KCL IN DEXTROSE-NACL 10-5-0.45 MEQ/L-%-% IV SOLN
INTRAVENOUS | Status: DC
  Filled 2023-02-10 (×3): qty 1000

## 2023-02-10 MED ORDER — INSULIN ASPART 100 UNIT/ML IJ SOLN
6.0000 [IU] | Freq: Three times a day (TID) | INTRAMUSCULAR | Status: DC
  Administered 2023-02-10: 6 [IU] via SUBCUTANEOUS

## 2023-02-10 MED ORDER — METOPROLOL SUCCINATE ER 25 MG PO TB24: 25.0000 mg | ORAL_TABLET | Freq: Every day | ORAL | Status: DC

## 2023-02-10 MED ORDER — CHLORHEXIDINE GLUCONATE CLOTH 2 % EX PADS
6.0000 | MEDICATED_PAD | Freq: Every day | CUTANEOUS | Status: DC
  Administered 2023-02-10 – 2023-02-12 (×2): 6 via TOPICAL

## 2023-02-10 MED ORDER — PREDNISONE 5 MG PO TABS
5.0000 mg | ORAL_TABLET | Freq: Every day | ORAL | Status: DC
  Administered 2023-02-10 – 2023-02-12 (×3): 5 mg via ORAL
  Filled 2023-02-10 (×3): qty 1

## 2023-02-10 MED ORDER — POTASSIUM CHLORIDE CRYS ER 20 MEQ PO TBCR
20.0000 meq | EXTENDED_RELEASE_TABLET | Freq: Once | ORAL | Status: AC
  Administered 2023-02-10: 20 meq via ORAL
  Filled 2023-02-10: qty 1

## 2023-02-10 MED ORDER — ATORVASTATIN CALCIUM 40 MG PO TABS
80.0000 mg | ORAL_TABLET | Freq: Every day | ORAL | Status: DC
  Administered 2023-02-10 – 2023-02-11 (×2): 80 mg via ORAL
  Filled 2023-02-10 (×2): qty 2

## 2023-02-10 MED ORDER — LEVETIRACETAM 250 MG PO TABS: 250.0000 mg | ORAL_TABLET | Freq: Every day | ORAL | Status: DC

## 2023-02-10 MED ORDER — AMLODIPINE BESYLATE 10 MG PO TABS: 10.0000 mg | ORAL_TABLET | Freq: Every day | ORAL | Status: DC

## 2023-02-10 MED ORDER — INSULIN ASPART 100 UNIT/ML IJ SOLN
8.0000 [IU] | Freq: Three times a day (TID) | INTRAMUSCULAR | Status: DC
  Administered 2023-02-10: 8 [IU] via SUBCUTANEOUS

## 2023-02-10 MED ORDER — INSULIN ASPART 100 UNIT/ML IJ SOLN
0.0000 [IU] | Freq: Three times a day (TID) | INTRAMUSCULAR | Status: DC
  Administered 2023-02-10: 15 [IU] via SUBCUTANEOUS
  Administered 2023-02-11 (×3): 5 [IU] via SUBCUTANEOUS
  Administered 2023-02-12: 8 [IU] via SUBCUTANEOUS

## 2023-02-10 MED ORDER — TACROLIMUS ER 4 MG PO TB24
7.0000 mg | ORAL_TABLET | Freq: Every day | ORAL | Status: DC
  Administered 2023-02-10 – 2023-02-12 (×3): 7 mg via ORAL
  Filled 2023-02-10 (×3): qty 3

## 2023-02-10 MED ORDER — MYCOPHENOLATE SODIUM 180 MG PO TBEC
540.0000 mg | DELAYED_RELEASE_TABLET | Freq: Two times a day (BID) | ORAL | Status: DC
  Administered 2023-02-10 – 2023-02-12 (×5): 540 mg via ORAL
  Filled 2023-02-10 (×5): qty 3

## 2023-02-10 NOTE — Progress Notes (Signed)
PROGRESS NOTE    Hector Mahoney  ZOX:[id number] DOB: 01/26/1971 DOA: 02/09/2023 PCP: Corwin Levins, MD   Brief Narrative: 52 year old with past medical history significant for insulin-dependent diabetes, s/p renal transplant (12/2021), essential hypertension, chronic systolic heart failure ejection fraction 45 to 50%, hyperlipidemia presents with generalized weakness, found to have hyperglycemia vagal morning, prior creatinine 1.1 on January 14, 2023.  Patient presented with AKI in the setting of hyperglycemic hyperosmolar state.  Assessment & Plan:   Principal Problem:   Hyperglycemia Active Problems:   Essential hypertension   AKI (acute kidney injury) (HCC)   Hyperlipidemia   Generalized weakness   Hypercalcemia   Dehydration   Acute prerenal azotemia   Chronic systolic CHF (congestive heart failure) (HCC)   1-Hyperglycemia Hyperosmolar state Diabetes type 2 uncontrolled: -Unclear etiology, he report poor oral intake few days, but he hasn't been following Carb modified diet.  -De denies chest pain, dyspnea, report mild cough. Denies dysuria.  -Work up for infection: UA negative, Chest x ry negative . Will check Blood cultures.  -Treated with insulin Gtt and IV fluids.  -Transition to Levemir, meal coverage and SSI.  -A1c pending.  -Renal US: No hydronephrosis or other acute finding. 1.3 cm hypoechoic region at the posterior margin of the urinary bladder, possibly a small bladder mass. 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 Needs follow up with urology for possible small bladder mass.   2-AKI: Likely prerenal in the setting of hypovolemia and hyperglycemia. History of renal transplant. Nephrology consulted.  Continue with immunosuppressive therapy tacrolimus, mycophenolate and daily prednisone. Follow tacrolimus level Continue to hold losartan Continue with IV fluids.   Generalized weakness: -PT/OT consult;  -TSH  0.86, B 12 not low.   Hypercalcemia: -Suspect related to hypovolemia.  -normalizing.   Essential hypertension: -Resume Norvasc  Hyperlipidemia: -Continue with lipitor.   Hyponatremia; pseudohyponatremia in setting hyperglycemia.  Hypokalemia; replete orally.   Chronic systolic heart failure ejection fraction 45 to 50% Continue to hold diuretics in the setting of AKI. Check echo. Mild elevation troponin.       Estimated body mass index is 36.26 kg/m as calculated from the following:   Height as of this encounter: 5\' 6"  (1.676 m).   Weight as of this encounter: 101.9 kg.   DVT prophylaxis: SCD Code Status: Full code Family Communication: Wife at bedside Disposition Plan:  Status is: Inpatient Remains inpatient appropriate because: management of AKI    Consultants:  Nephrology   Procedures:  Renal US  Antimicrobials:    Subjective: He is alert, denies chest pain, dyspnea, dysuria.  No significant urine out put since admission   Objective: Vitals:   02/10/23 0300 02/10/23 0400 02/10/23 0500 02/10/23 0600  BP: (!) 135/57 135/66 138/65 (!) 154/69  Pulse: 89 88 86 88  Resp: 19 (!) 4 13 (!) 21  Temp:  98 F (36.7 C)    TempSrc:      SpO2: 92% 94% 94% 96%  Weight:   101.9 kg   Height:        Intake/Output Summary (Last 24 hours) at 02/10/2023 0705 Last data filed at 02/10/2023 0600 Gross per 24 hour  Intake 1133.22 ml  Output --  Net 1133.22 ml   Filed Weights   02/09/23 2200 02/10/23 0500  Weight: 99.5 kg 101.9 kg    Examination:  General exam: Appears calm and comfortable  Respiratory system: Clear to auscultation. Respiratory effort normal. Cardiovascular system: S1 &  S2 heard, RRR. No JVD, murmurs, rubs, gallops or clicks. No pedal edema. Gastrointestinal system: Abdomen is nondistended, soft and nontender. No organomegaly or masses felt. Normal bowel sounds heard. Central nervous system: Alert and oriented. Extremities: Symmetric 5 x 5  power.   Data Reviewed: I have personally reviewed following labs and imaging studies  CBC: Recent Labs  Lab 02/09/23 1907 02/10/23 0227  WBC 6.0 6.8  NEUTROABS 4.0 4.8  HGB 17.0  14.9 14.7  HCT 50.0  45.3 44.9  MCV 79.5* 79.3*  PLT 172 191   Basic Metabolic Panel: Recent Labs  Lab 02/09/23 1907 02/09/23 2217 02/10/23 0153 02/10/23 0155  NA 124*  124*  --  130* 132*  K 4.0  4.1  --  2.8* 2.9*  CL 83*  --  93* 94*  CO2 27  --  25 26  GLUCOSE 735*  --  324* 327*  BUN 59*  --  56* 56*  CREATININE 2.49*  --  2.12* 2.11*  CALCIUM 10.7*  --  10.3 10.3  MG  --  2.5* 2.4  --   PHOS  --   --  2.4*  --    GFR: Estimated Creatinine Clearance: 46.8 mL/min (A) (by C-G formula based on SCr of 2.11 mg/dL (H)). Liver Function Tests: Recent Labs  Lab 02/09/23 1907 02/10/23 0153  AST 20 22  ALT 37 39  ALKPHOS 154* 139*  BILITOT 1.2 0.8  PROT 8.0 7.7  ALBUMIN 4.5 4.1   No results for input(s): "LIPASE", "AMYLASE" in the last 168 hours. No results for input(s): "AMMONIA" in the last 168 hours. Coagulation Profile: No results for input(s): "INR", "PROTIME" in the last 168 hours. Cardiac Enzymes: Recent Labs  Lab 02/10/23 0153  CKTOTAL 169   BNP (last 3 results) No results for input(s): "PROBNP" in the last 8760 hours. HbA1C: No results for input(s): "HGBA1C" in the last 72 hours. CBG: Recent Labs  Lab 02/10/23 0231 02/10/23 0334 02/10/23 0433 02/10/23 0532 02/10/23 0642  GLUCAP 246* 227* 211* 201* 203*   Lipid Profile: No results for input(s): "CHOL", "HDL", "LDLCALC", "TRIG", "CHOLHDL", "LDLDIRECT" in the last 72 hours. Thyroid Function Tests: Recent Labs    02/10/23 0155  TSH 0.869   Anemia Panel: Recent Labs    02/10/23 0153  VITAMINB12 2,940*   Sepsis Labs: Recent Labs  Lab 02/10/23 0155  PROCALCITON 0.11    Recent Results (from the past 240 hour(s))  MRSA Next Gen by PCR, Nasal     Status: None   Collection Time: 02/10/23  2:32 AM    Specimen: Nasal Mucosa; Nasal Swab  Result Value Ref Range Status   MRSA by PCR Next Gen NOT DETECTED NOT DETECTED Final    Comment: (NOTE) The GeneXpert MRSA Assay (FDA approved for NASAL specimens only), is one component of a comprehensive MRSA colonization surveillance program. It is not intended to diagnose MRSA infection nor to guide or monitor treatment for MRSA infections. Test performance is not FDA approved in patients less than 48 years old. Performed at [facility], [address] [place], [place] [address]          Radiology Studies: DG Chest Portable 1 View  Result Date: 02/09/2023 CLINICAL DATA:  SOB EXAM: PORTABLE CHEST - 1 VIEW COMPARISON:  02/14/2021 FINDINGS: Lungs are clear. Heart size and mediastinal contours are within normal limits. No effusion. Visualized bones unremarkable. IMPRESSION: No acute cardiopulmonary disease. Electronically Signed   By: Ronald Pippins.D.  On: 02/09/2023 19:39        Scheduled Meds:  aspirin EC  81 mg Oral Daily   atorvastatin  80 mg Oral QHS   Chlorhexidine Gluconate Cloth  6 each Topical Q0600   cinacalcet  60 mg Oral QPM   levETIRAcetam  250 mg Oral QHS   metoprolol succinate  25 mg Oral Daily   mycophenolate  540 mg Oral BID   predniSONE  5 mg Oral Q breakfast   tacrolimus  1 mg Oral BID   triamcinolone  2 spray Nasal Daily   Continuous Infusions:  dextrose 5 % and 0.45 % NaCl with KCl 10 mEq/L 125 mL/hr at 02/10/23 0245   insulin 7.5 Units/hr (02/10/23 0644)   sodium chloride 0.45 % 1,000 mL with potassium chloride 10 mEq infusion Stopped (02/10/23 0246)     LOS: 1 day    Time spent: 35 minutes    Rosamund Nyland A Dajai Wahlert, MD [facility]   If 7PM-7AM, please contact night-coverage [email or web address]  02/10/2023, 7:05 AM

## 2023-02-10 NOTE — Inpatient Diabetes Management (Signed)
Inpatient Diabetes Program Recommendations  AACE/ADA: New Consensus Statement on Inpatient Glycemic Control (2015)  Target Ranges:  Prepandial:   less than 140 mg/dL      Peak postprandial:   less than 180 mg/dL (1-2 hours)      Critically ill patients:  140 - 180 mg/dL   Lab Results  Component Value Date   GLUCAP 157 (H) 02/10/2023   HGBA1C 8.8 (A) 09/24/2022    Review of Glycemic Control  Latest Reference Range & Units 02/10/23 05:32 02/10/23 06:42 02/10/23 07:50 02/10/23 08:26 02/10/23 09:30 02/10/23 10:34 02/10/23 11:42 02/10/23 12:35  Glucose-Capillary 70 - 99 mg/dL 161 (H) 096 (H) 045 (H) 187 (H) 185 (H) 166 (H) 181 (H) 157 (H)   Diabetes history: DM 2 Outpatient Diabetes medications: Lantus 50 units qhs, Novolog 30 units tid, Actos 30 mg Daily, Farxiga 10 mg Daily Renal/pancreatic transplant 12/2021, PO prednisone 5 mg Daily Gets insulin through [place] Current orders for Inpatient glycemic control:  Levemir 40 units Daily Novolog 0-9 units tid + hs Novolog 6 units tid meal coverage  Endocrinologist Dr. Lonzo Cloud, last visit on 6/14 with adjustments made to basal insulin Glucose 735 on presentation  Diabetes Coordinator works remotely over the weekends and is available by pager and phone. Pt will be here Monday. Diabetes Coordinator will see pt this admission and inquire about glucose control at home and compliance with lifestyle modifications. Per very recent Endocrinology notes. Pt is compliant with medications. Will follow pt and glucose trends.   Thanks,  Christena Deem RN, MSN, BC-ADM Inpatient Diabetes Coordinator Team Pager [phone] (8a-5p)

## 2023-02-10 NOTE — Progress Notes (Signed)
OT Cancellation Note  Patient Details Name: Hector Mahoney MRN: [id number] DOB: 1971/05/03   Cancelled Treatment:    Reason Eval/Treat Not Completed: OT screened, no needs identified, will sign off. OT spoke with the pt's evaluating PT who indicated the pt is without acute functional deficits and limitations. The pt is able to complete basic functional tasks without the need for assistance. OT will sign off. Thank you.    Reuben Likes, OTR/L 02/10/2023, 1:54 PM

## 2023-02-10 NOTE — Evaluation (Signed)
Physical Therapy Evaluation Patient Details Name: Hector Mahoney MRN: [id number] DOB: Nov 02, 1970 Today's Date: 02/10/2023  History of Present Illness  Pt admitted from home 2* hyperglycemia, AKI and dehydration.  Pt with hx of DM, CHF and s/p renal transplant  Clinical Impression  Pt admitted as above and presenting with ability to perform all basic mobility tasks including ambulation x 350' unassisted.  Pt states he feels near baseline.  PT services will dc at this time - RN aware.     Recommendations for follow up therapy are one component of a multi-disciplinary discharge planning process, led by the attending physician.  Recommendations may be updated based on patient status, additional functional criteria and insurance authorization.  Follow Up Recommendations       Assistance Recommended at Discharge PRN  Patient can return home with the following       Equipment Recommendations None recommended by PT  Recommendations for Other Services       Functional Status Assessment Patient has not had a recent decline in their functional status     Precautions / Restrictions Precautions Precautions: Fall Restrictions Weight Bearing Restrictions: No      Mobility  Bed Mobility Overal bed mobility: Modified Independent             General bed mobility comments: No physical assist    Transfers Overall transfer level: Modified independent Equipment used: None                    Ambulation/Gait Ambulation/Gait assistance: Supervision, Independent Gait Distance (Feet): 350 Feet Assistive device: None Gait Pattern/deviations: WFL(Within Functional Limits)       General Gait Details: increased BOS with minimal instability and no overt LOB  Stairs            Wheelchair Mobility    Modified Rankin (Stroke Patients Only)       Balance                                             Pertinent Vitals/Pain Pain Assessment Pain  Assessment: No/denies pain    Home Living Family/patient expects to be discharged to:: Private residence Living Arrangements: Spouse/significant other Available Help at Discharge: Family Type of Home: House Home Access: Stairs to enter   Secretary/administrator of Steps: 1   Home Layout: One level Home Equipment: None      Prior Function Prior Level of Function : Independent/Modified Independent             Mobility Comments: Working at post Artist        Extremity/Trunk Assessment   Upper Extremity Assessment Upper Extremity Assessment: Overall WFL for tasks assessed    Lower Extremity Assessment Lower Extremity Assessment: Overall WFL for tasks assessed    Cervical / Trunk Assessment Cervical / Trunk Assessment: Normal  Communication   Communication: No difficulties  Cognition Arousal/Alertness: Awake/alert Behavior During Therapy: WFL for tasks assessed/performed Overall Cognitive Status: Within Functional Limits for tasks assessed                                          General Comments      Exercises     Assessment/Plan    PT Assessment  Patient needs continued PT services;Patient does not need any further PT services  PT Problem List         PT Treatment Interventions      PT Goals (Current goals can be found in the Care Plan section)  Acute Rehab PT Goals Patient Stated Goal: Get something to eat PT Goal Formulation: All assessment and education complete, DC therapy    Frequency Min 1X/week     Co-evaluation               AM-PAC PT "6 Clicks" Mobility  Outcome Measure Help needed turning from your back to your side while in a flat bed without using bedrails?: None Help needed moving from lying on your back to sitting on the side of a flat bed without using bedrails?: None Help needed moving to and from a bed to a chair (including a wheelchair)?: None Help needed standing up from a chair  using your arms (e.g., wheelchair or bedside chair)?: None Help needed to walk in hospital room?: None Help needed climbing 3-5 steps with a railing? : A Little 6 Click Score: 23    End of Session Equipment Utilized During Treatment: Gait belt Activity Tolerance: Patient tolerated treatment well Patient left: in bed;with call bell/phone within reach;with bed alarm set Nurse Communication: Mobility status      Time: 8119-1478 PT Time Calculation (min) (ACUTE ONLY): 22 min   Charges:   PT Evaluation $PT Eval Low Complexity: 1 Low          Hector Mahoney PT Acute Rehabilitation Services Pager [phone] Office [phone]   Dossie Swor 02/10/2023, 1:14 PM

## 2023-02-10 NOTE — Progress Notes (Signed)
  Echocardiogram 2D Echocardiogram has been performed.  Milda Smart 02/10/2023, 3:43 PM

## 2023-02-10 NOTE — Consult Note (Addendum)
Renal Service Consult Note [facility]  DEARIUS MCENTIRE 02/10/2023 Maree Krabbe, MD Requesting Physician: Dr Sunnie Nielsen  Reason for Consult: Renal transplant  HPI: The patient is a 52 y.o. year-old w/ PMH as below who presented to ED 6/29 due to concern for dehydration and increasing creatinine from urgent care labs. Pt had problems w/ fluid overload about 3 wks ago and was started on metolazone (in addition to his baseline lasix 80 bid). He lost about 17 lbs and swelling improved. Then wife became concerned about his condition, he was feeling sick and they stopped the metolazone. He cont'd the lasix. But her continued to feel worse, more fatigued and gen'd weakness. Worried he was dehydrated. Labs at [facility] showed creat 2.4 (baseline 1.15 from 01/14/23).  In ED HR 90-100, BP 110-140/ 80s , RR 18, 94-100% on RA. Na 124 corrected to 134 w/ high BS accounted for. AG 14. Glucose 735. Ca 10.5.  BNP 41. wBC 6K.  UA LE/ nit neg, neg protein, no rbc's, +wbcs. CXR negative. Pt rec'd LR 500cc bolus and LR at 125 cc/hr and was admitted. Creat overnight is now down to 1.86. We are asked to see for renal failure.   In ED BP's were in the 90s but have improved today up to 135/ 80 range.  On RA 99% sats. Had 2.7 L in yesterday and 730 cc UOP recorded.    Pt seen in his room.  Pt started dialysis in 2019. He did PD, in-center HD and home HHD prior to getting a pancreas/ renal transplant at [facility] on Dec 28, 2021.  One of his cousins new someone that worked in the transplant office at [employer] and they encouraged him to try [facility] transplant (he was already on the list and [facility] and [place]). His renal function has been very good post transplant w/ usual creat in the 1.05- 1.20 range per the pt. 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   Also pt says he had a remote hx of CHF around 2013 maybe, but that his heart function has since recovered.   ROS - denies CP, no joint pain, no HA, no blurry  vision, no rash, no diarrhea, no nausea/ vomiting, no dysuria, no difficulty voiding   Past Medical History  Past Medical History:  Diagnosis Date   Anemia    CHF (congestive heart failure) (HCC)    Esophageal reflux 04/27/2009   ESRD (end stage renal disease) on dialysis [facility])    prior to renal transplant   HYPERLIPIDEMIA 04/24/2007   HYPERTENSION 04/24/2007   Insulin dependent type 1 diabetes mellitus (HCC)    Past Surgical History  Past Surgical History:  Procedure Laterality Date   AV FISTULA PLACEMENT Left 06/04/2019   Procedure: BRACHIAL-CEPHALIC ARTERIOVENOUS (AV) FISTULA CREATION LEFT ARM;  Surgeon: Larina Earthly, MD;  Location: [facility] OR;  Service: Vascular;  Laterality: Left;   CAPD INSERTION N/A 08/24/2021   Procedure: LAPAROSCOPIC INSERTION CONTINUOUS AMBULATORY PERITONEAL DIALYSIS  (CAPD) CATHETER;  Surgeon: Leonie Douglas, MD;  Location: [facility] OR;  Service: Vascular;  Laterality: N/A;   COLONOSCOPY     polyp   IR FLUORO GUIDE CV LINE RIGHT  10/22/2017   IR THROMBECTOMY AV FISTULA W/THROMBOLYSIS/PTA/STENT INC/SHUNT/IMG LT Left 03/2021   IR US GUIDE VASC ACCESS RIGHT  10/22/2017   MOUTH SURGERY     tooth ext   Family History  Family History  Problem Relation Age of Onset   Hypertension Mother  Diabetes Mother    Diabetes Maternal Aunt    Lung cancer Maternal Grandfather    Colon polyps Maternal Uncle    Colon cancer Maternal Uncle    Heart disease Maternal Uncle    Kidney disease Neg Hx    Esophageal cancer Neg Hx    Rectal cancer Neg Hx    Stomach cancer Neg Hx    Social History  reports that he quit smoking about 7 years ago. His smoking use included cigarettes. He has never used smokeless tobacco. He reports that he does not currently use alcohol. He reports that he does not use drugs. Allergies No Active Allergies Home medications Prior to Admission medications   Medication Sig Start Date End Date Taking? Authorizing Provider  amLODipine (NORVASC) 10 MG  tablet Take 1 tablet by mouth daily. 10/29/21  Yes [provider]  atorvastatin (LIPITOR) 80 MG tablet Take 80 mg by mouth at bedtime. 02/26/18  Yes [provider]  carvedilol (COREG) 25 MG tablet Take 1 tablet (25 mg total) by mouth 2 (two) times daily. Patient taking differently: Take 12.5 mg by mouth 2 (two) times daily with a meal. 12/26/22 12/26/23 Yes Corwin Levins, MD  furosemide (LASIX) 80 MG tablet Take 80 mg by mouth 2 (two) times daily.   Yes [provider]  insulin aspart (NOVOLOG) 100 UNIT/ML injection INJECT 8 TO 12 UNITS INTO THE SKIN THREE TIMES DAILY WITH MEALS Patient taking differently: Inject 30 Units into the skin 3 (three) times daily with meals. 10/10/20  Yes Corwin Levins, MD  insulin glargine (LANTUS) 100 UNIT/ML injection Inject 0.4 mLs (40 Units total) into the skin at bedtime. Patient taking differently: Inject 50 Units into the skin at bedtime. 01/03/23  Yes Shamleffer, Konrad Dolores, MD  magnesium oxide (MAG-OX) 400 (240 Mg) MG tablet Take 400 mg by mouth 2 (two) times daily.   Yes [provider]  multivitamin (RENA-VIT) TABS tablet Take 1 tablet by mouth daily. 03/20/19  Yes [provider]  mycophenolate (MYFORTIC) 180 MG EC tablet Take 540 mg by mouth 2 (two) times daily.   Yes [provider]  omeprazole (PRILOSEC) 40 MG capsule Take 1 capsule (40 mg total) by mouth 2 (two) times daily. Patient taking differently: Take 40 mg by mouth daily. 04/19/21  Yes Unk Lightning, PA  pioglitazone (ACTOS) 30 MG tablet Take 30 mg by mouth daily.   Yes [provider]  predniSONE (DELTASONE) 5 MG tablet Take 5 mg by mouth daily with breakfast.   Yes [provider]  tacrolimus ER (ENVARSUS XR) 1 MG TB24 Take 3 mg by mouth daily before breakfast.   Yes [provider]  tacrolimus ER (ENVARSUS XR) 4 MG TB24 Take 4 mg by mouth daily before breakfast.   Yes [provider]  tamsulosin  (FLOMAX) 0.4 MG CAPS capsule Take 0.4 mg by mouth at bedtime.   Yes [provider]  Zinc 50 MG TABS Take 1 tablet by mouth daily at 6 (six) AM.   Yes [provider]  cetirizine (ZYRTEC) 10 MG tablet Take 1 tablet by mouth once daily Patient not taking: Reported on 02/10/2023 04/18/21   Corwin Levins, MD  clonazePAM (KLONOPIN) 0.5 MG tablet TAKE 1/2 (ONE-HALF) TABLET BY MOUTH AT BEDTIME Patient not taking: Reported on 02/10/2023 12/11/21   Tat, Octaviano Batty, DO  dicyclomine (BENTYL) 20 MG tablet Take 1 tablet (20 mg total) by mouth in the morning and at bedtime. Patient  not taking: Reported on 02/10/2023 04/02/22   Unk Lightning, PA  HYDROcodone-acetaminophen (NORCO) 5-325 MG tablet Take 1 tablet by mouth every 6 (six) hours as needed for moderate pain. Patient not taking: Reported on 02/10/2023 08/24/21   Emilie Rutter, PA-C  levETIRAcetam (KEPPRA) 250 MG tablet Take 1 tablet (250 mg total) by mouth at bedtime. Patient not taking: Reported on 02/10/2023 04/20/21   Corwin Levins, MD  meclizine (ANTIVERT) 12.5 MG tablet TAKE 1 TABLET BY MOUTH THREE TIMES DAILY AS NEEDED FOR DIZZINESS Patient not taking: Reported on 02/10/2023 05/28/20   Corwin Levins, MD  ondansetron (ZOFRAN) 4 MG tablet Take 1 tablet (4 mg total) by mouth every 8 (eight) hours as needed for nausea or vomiting. Patient not taking: Reported on 02/10/2023 12/20/20   Corwin Levins, MD  triamcinolone (NASACORT) 55 MCG/ACT AERO nasal inhaler Place 2 sprays into the nose daily. Patient not taking: Reported on 02/10/2023 05/21/20   Jeoffrey Massed, MD     Vitals:   02/10/23 1230 02/10/23 1500 02/10/23 1505 02/10/23 1616  BP:   127/63 135/68  Pulse:  94 94 (!) 103  Resp:  (!) 0 15 16  Temp: 98.1 F (36.7 C)   98 F (36.7 C)  TempSrc: Oral   Oral  SpO2:  94% 94% 99%  Weight:      Height:       Exam Gen alert, no distress No rash, cyanosis or gangrene Has thick skin in the back area and the flanks Sclera  anicteric, throat clear  No jvd or bruits Chest clear bilat to bases, no rales/ wheezing RRR no MRG Abd soft ntnd no mass or ascites +bs GU normal male MS no joint effusions or deformity Ext no LE or UE edema, no wounds or ulcers Neuro is alert, Ox 3 , nf       Home meds include - tacrolimus ER 7mg  qam, norvasc 10, lipitor, coreg 12.5 bid, lasix 80 bid, insulin aspart/ glargine, renavite, myfortic 540 bid, prilosec, pioglitazone, prednisone 5 every day, flomax, klonopin hs, norco prn, keppra, prns/ vits/ supps   UA 6/29 - glu > 1000, neg Hb, trace ket, prot neg, pH 5.5, 0-5 rbc/ wbc/epi  Labs - admit 6/29 --> Na 123 K 4.0  CO2 27  cl 83 bun 59 creat 2.49             6/30 1 am --> Na 132  K 2.9  CO 94  CO2 26  BUN 56  creat 2.11             6/30  12 noon --> Na 131 K 3.5  Cl 97 CO2 23 BUN 49 creat 1.86    Assessment/ Plan: AKI - in transplant kidney w/ baseline creat 1.0- 1.2, date of transplant 12/28/21. He is f/b [facility] transplant team. Creat here was 2.4 on admission after he had been taking heavier doses of diuretics for an edema episode for a few weeks. UA was negative except for glucosuria. A renal US was done but on the native kidneys --> ordered renal transplant [place] for tomorrow am. Pt was admitted and given IVF's for dehydration and the creat has improved to 2.1 , then to 1.9 and then 1.8 today. UOP is good, urine color is wnl. He just received another 1 L bolus and is also getting IVF at 100 cc/hr.  Will ^ to 125 cc/hr. AKI due to volume depletion. He is recovering w/ holding diuretics, rx'ing high  BS and giving IVF's. F/u labs in am.  Volume depletion - due to diuretics possibly, but also he had BS in the 700's at admission and that can surely be a possible contributor to volume depletion.  Transplant immunosuppression - correctly he is getting prograf er 7mg  qam, prednisone 5mg  qam and myfortic 540mg  bid.  HTN - BP's wnl, home meds of norvasc and coreg are on hold. If BP's remain high  tomorrow consider resuming home BP lowering meds. HHS - BS 700s on admission. Per pmd.  Chronic syst HF - per pt, this has resolved. ECHO done here showing EF 60-65%.        Vinson Moselle  MD [facility] 02/10/2023, 7:22 PM  Recent Labs  Lab 02/09/23 1907 02/10/23 0153 02/10/23 0155 02/10/23 0227 02/10/23 0826 02/10/23 1151  HGB 17.0  14.9  --   --  14.7  --   --   ALBUMIN 4.5 4.1  --   --   --   --   CALCIUM 10.7* 10.3   < >  --  10.2 10.0  PHOS  --  2.4*  --   --   --   --   CREATININE 2.49* 2.12*   < >  --  1.92* 1.86*  K 4.0  4.1 2.8*   < >  --  3.3* 3.5   < > = values in this interval not displayed.   Inpatient medications:  atorvastatin  80 mg Oral QHS   Chlorhexidine Gluconate Cloth  6 each Topical Q0600   insulin aspart  0-15 Units Subcutaneous TID WC   insulin aspart  0-5 Units Subcutaneous QHS   insulin aspart  8 Units Subcutaneous TID WC   insulin detemir  40 Units Subcutaneous Daily   mycophenolate  540 mg Oral BID   predniSONE  5 mg Oral Q breakfast   tacrolimus ER  7 mg Oral QAC breakfast    sodium chloride 100 mL/hr at 02/10/23 1439   sodium chloride     acetaminophen **OR** acetaminophen, dextrose, melatonin, ondansetron (ZOFRAN) IV

## 2023-02-10 NOTE — Progress Notes (Signed)
Pt stable at time of transfer. Report given. Belongings packed & given to pt

## 2023-02-11 ENCOUNTER — Inpatient Hospital Stay (HOSPITAL_COMMUNITY): Payer: Federal, State, Local not specified - PPO

## 2023-02-11 DIAGNOSIS — I5022 Chronic systolic (congestive) heart failure: Secondary | ICD-10-CM | POA: Diagnosis not present

## 2023-02-11 DIAGNOSIS — I10 Essential (primary) hypertension: Secondary | ICD-10-CM | POA: Diagnosis not present

## 2023-02-11 DIAGNOSIS — N179 Acute kidney failure, unspecified: Secondary | ICD-10-CM | POA: Diagnosis not present

## 2023-02-11 DIAGNOSIS — E1100 Type 2 diabetes mellitus with hyperosmolarity without nonketotic hyperglycemic-hyperosmolar coma (NKHHC): Secondary | ICD-10-CM | POA: Diagnosis not present

## 2023-02-11 DIAGNOSIS — R739 Hyperglycemia, unspecified: Secondary | ICD-10-CM | POA: Diagnosis not present

## 2023-02-11 LAB — CULTURE, BLOOD (ROUTINE X 2)

## 2023-02-11 LAB — GLUCOSE, CAPILLARY
Glucose-Capillary: >600 mg/dL (ref 70–99)
Glucose-Capillary: 248 mg/dL — ABNORMAL HIGH (ref 70–99)
Glucose-Capillary: 232 mg/dL — ABNORMAL HIGH (ref 70–99)
Glucose-Capillary: 241 mg/dL — ABNORMAL HIGH (ref 70–99)
Glucose-Capillary: 225 mg/dL — ABNORMAL HIGH (ref 70–99)
Glucose-Capillary: 239 mg/dL — ABNORMAL HIGH (ref 70–99)

## 2023-02-11 LAB — CREATININE, URINE, RANDOM: Creatinine, Urine: 60 mg/dL

## 2023-02-11 LAB — BASIC METABOLIC PANEL
Anion gap: 12 (ref 5–15)
BUN: 38 mg/dL — ABNORMAL HIGH (ref 6–20)
CO2: 20 mmol/L — ABNORMAL LOW (ref 22–32)
Calcium: 9.5 mg/dL (ref 8.9–10.3)
Chloride: 102 mmol/L (ref 98–111)
Creatinine, Ser: 1.64 mg/dL — ABNORMAL HIGH (ref 0.61–1.24)
GFR, Estimated: 51 mL/min — ABNORMAL LOW (ref >60)
Glucose, Bld: 231 mg/dL — ABNORMAL HIGH (ref 70–99)
Potassium: 3.4 mmol/L — ABNORMAL LOW (ref 3.5–5.1)
Sodium: 134 mmol/L — ABNORMAL LOW (ref 135–145)

## 2023-02-11 LAB — HEMOGLOBIN A1C
Hgb A1c MFr Bld: 13.2 % — ABNORMAL HIGH (ref 4.8–5.6)
Mean Plasma Glucose: 332 mg/dL

## 2023-02-11 LAB — SODIUM, URINE, RANDOM: Sodium, Ur: 64 mmol/L

## 2023-02-11 LAB — RAPID URINE DRUG SCREEN, HOSP PERFORMED
Amphetamines: NOT DETECTED
Barbiturates: NOT DETECTED
Benzodiazepines: NOT DETECTED
Cocaine: NOT DETECTED
Opiates: NOT DETECTED
Tetrahydrocannabinol: NOT DETECTED

## 2023-02-11 MED ORDER — POTASSIUM CHLORIDE CRYS ER 20 MEQ PO TBCR
40.0000 meq | EXTENDED_RELEASE_TABLET | Freq: Once | ORAL | Status: AC
  Administered 2023-02-11: 40 meq via ORAL
  Filled 2023-02-11: qty 2

## 2023-02-11 MED ORDER — INSULIN ASPART 100 UNIT/ML IJ SOLN
15.0000 [IU] | Freq: Three times a day (TID) | INTRAMUSCULAR | Status: DC
  Administered 2023-02-11 (×2): 15 [IU] via SUBCUTANEOUS

## 2023-02-11 MED ORDER — CALCIUM CARBONATE ANTACID 500 MG PO CHEW
1.0000 | CHEWABLE_TABLET | Freq: Three times a day (TID) | ORAL | Status: DC | PRN
  Administered 2023-02-11: 200 mg via ORAL
  Filled 2023-02-11: qty 1

## 2023-02-11 MED ORDER — PANTOPRAZOLE SODIUM 40 MG PO TBEC
40.0000 mg | DELAYED_RELEASE_TABLET | Freq: Every day | ORAL | Status: DC
  Administered 2023-02-11 – 2023-02-12 (×2): 40 mg via ORAL
  Filled 2023-02-11 (×2): qty 1

## 2023-02-11 MED ORDER — INSULIN ASPART 100 UNIT/ML IJ SOLN
3.0000 [IU] | Freq: Once | INTRAMUSCULAR | Status: AC
  Administered 2023-02-11: 3 [IU] via SUBCUTANEOUS

## 2023-02-11 MED ORDER — INSULIN ASPART 100 UNIT/ML IJ SOLN
12.0000 [IU] | Freq: Three times a day (TID) | INTRAMUSCULAR | Status: DC
  Administered 2023-02-11: 12 [IU] via SUBCUTANEOUS

## 2023-02-11 MED ORDER — INSULIN DETEMIR 100 UNIT/ML ~~LOC~~ SOLN
50.0000 [IU] | Freq: Every day | SUBCUTANEOUS | Status: DC
  Administered 2023-02-11 – 2023-02-12 (×2): 50 [IU] via SUBCUTANEOUS
  Filled 2023-02-11 (×2): qty 0.5

## 2023-02-11 NOTE — Progress Notes (Signed)
[name]  Assessment/ Plan: Pt is a 52 y.o. yo male with history of ESRD status post kidney transplant at [facility] on 12/28/2021 presented with generalized weakness, fatigue in the setting of using higher dose of diuretics.  He was hypertensive in the ER with systolic BP in 90s.  # Acute kidney injury in transplanted kidney due to intravascular volume depletion/hemodynamically mediated due to hyperglycemia hyperosmolar state and higher use of diuretics at home.  Baseline creatinine around 1.0-1.2.  Creatinine level was around 2.4 on admission which is gradually improving.  UA bland except glucosuria.  Transplant kidney ultrasound result pending.  He was on Lasix 80 twice a day and metolazone every day for fluid overload which is on hold.  Treating with IV fluid. Continue IVF today, strict ins and out and lab in the morning. Expect gradual renal recovery to his baseline.  # Kidney transplant in 12/2021 at [facility].  He is still followed by the transplant team.  Continue current immunosuppression including Prograf, prednisone and Myfortic.  # Volume depletion/dehydration in the setting of recent use of diuretics and hyperglycemia.  Treating with fluid and diuretics on hold.  # Chronic systolic CHF: Echo with EF of 60 to 65%.  You will likely need furosemide, lower dose on discharge.  Subjective: Seen and examined at bedside.  Denies nausea, vomiting, chest pain, shortness of breath.  Urine output is recorded around 1.6 L.  Transplant kidney ultrasound done this morning. Objective Vital signs in last 24 hours: Vitals:   02/10/23 1616 02/10/23 1949 02/10/23 2053 02/11/23 0602  BP: 135/68 (!) 190/76 (!) 148/82 (!) 131/50  Pulse: (!) 103 94  93  Resp: 16 16  16   Temp: 98 F (36.7 C) (!) 97.5 F (36.4 C)  98 F (36.7 C)  TempSrc: Oral Oral  Oral  SpO2: 99% 99%  99%  Weight:      Height:       Weight change:   Intake/Output Summary (Last 24 hours) at 02/11/2023  0828 Last data filed at 02/11/2023 0600 Gross per 24 hour  Intake 2960.42 ml  Output 1630 ml  Net 1330.42 ml       Labs: RENAL PANEL Recent Labs  Lab 02/09/23 1907 02/09/23 2217 02/10/23 0153 02/10/23 0155 02/10/23 0826 02/10/23 1151 02/10/23 2156 02/11/23 0454  NA 124*  124*  --  130* 132* 132* 131* 131* 134*  K 4.0  4.1  --  2.8* 2.9* 3.3* 3.5 3.7 3.4*  CL 83*  --  93* 94* 97* 97* 99 102  CO2 27  --  25 26 23 23 24  20*  GLUCOSE 735*  --  324* 327* 176* 167* 380* 231*  BUN 59*  --  56* 56* 53* 49* 48* 38*  CREATININE 2.49*  --  2.12* 2.11* 1.92* 1.86* 1.83* 1.64*  CALCIUM 10.7*  --  10.3 10.3 10.2 10.0 9.6 9.5  MG  --  2.5* 2.4  --   --   --   --   --   PHOS  --   --  2.4*  --   --   --   --   --   ALBUMIN 4.5  --  4.1  --   --   --   --   --     Liver Function Tests: Recent Labs  Lab 02/09/23 1907 02/10/23 0153  AST 20 22  ALT 37 39  ALKPHOS 154* 139*  BILITOT 1.2 0.8  PROT 8.0  7.7  ALBUMIN 4.5 4.1   No results for input(s): "LIPASE", "AMYLASE" in the last 168 hours. No results for input(s): "AMMONIA" in the last 168 hours. CBC: Recent Labs    02/09/23 1907 02/10/23 0153 02/10/23 0227  HGB 17.0  14.9  --  14.7  MCV 79.5*  --  79.3*  VITAMINB12  --  2,940*  --     Cardiac Enzymes: Recent Labs  Lab 02/10/23 0153  CKTOTAL 169   CBG: Recent Labs  Lab 02/10/23 1853 02/10/23 1950 02/10/23 2251 02/11/23 0237 02/11/23 0745  GLUCAP 492* 457* 294* 248* 232*    Iron Studies: No results for input(s): "IRON", "TIBC", "TRANSFERRIN", "FERRITIN" in the last 72 hours. Studies/Results: ECHOCARDIOGRAM COMPLETE  Result Date: 02/10/2023    ECHOCARDIOGRAM REPORT   Patient Name:   Hector Mahoney Date of Exam: 02/10/2023 Medical Rec #:  [id number]          Height:       66.0 in Accession #:    [id number]         Weight:       224.6 lb Date of Birth:  [birth year]          BSA:          2.101 m Patient Age:    52 years           BP:           127/63 mmHg  Patient Gender: M                  HR:           95 bpm. Exam Location:  Inpatient Procedure: 2D Echo, Cardiac Doppler and Color Doppler Indications:    Abnormal ECG  History:        Patient has prior history of Echocardiogram examinations, most                 recent 03/01/2021. CHF; Risk Factors:Diabetes, Hypertension and                 Dyslipidemia. S/p renal transplant.  Sonographer:    Milda Smart Referring Phys: [id number] BELKYS A REGALADO  Sonographer Comments: Image acquisition challenging due to patient body habitus and Image acquisition challenging due to respiratory motion. IMPRESSIONS  1. Left ventricular ejection fraction, by estimation, is 60 to 65%. The left ventricle has normal function. The left ventricle has no regional wall motion abnormalities. There is moderate asymmetric left ventricular hypertrophy of the lateral segment. Left ventricular diastolic parameters are consistent with Grade I diastolic dysfunction (impaired relaxation).  2. Right ventricular systolic function was not well visualized. The right ventricular size is normal. Tricuspid regurgitation signal is inadequate for assessing PA pressure.  3. The mitral valve is normal in structure. No evidence of mitral valve regurgitation. No evidence of mitral stenosis.  4. The aortic valve is tricuspid. Aortic valve regurgitation is not visualized. No aortic stenosis is present. Comparison(s): Changes from prior study are noted. LVEF improved from 45-50% to 60-65% now. FINDINGS  Left Ventricle: Left ventricular ejection fraction, by estimation, is 60 to 65%. The left ventricle has normal function. The left ventricle has no regional wall motion abnormalities. The left ventricular internal cavity size was normal in size. There is  moderate asymmetric left ventricular hypertrophy of the lateral segment. Left ventricular diastolic parameters are consistent with Grade I diastolic dysfunction (impaired relaxation). Right Ventricle: The right  ventricular size is normal. No increase in right ventricular wall  thickness. Right ventricular systolic function was not well visualized. Tricuspid regurgitation signal is inadequate for assessing PA pressure. Left Atrium: Left atrial size was normal in size. Right Atrium: Right atrial size was normal in size. Pericardium: Trivial pericardial effusion is present. Mitral Valve: The mitral valve is normal in structure. There is moderate calcification of the mitral valve leaflet(s). Mild mitral annular calcification. No evidence of mitral valve regurgitation. No evidence of mitral valve stenosis. Tricuspid Valve: The tricuspid valve is grossly normal. Tricuspid valve regurgitation is not demonstrated. No evidence of tricuspid stenosis. Aortic Valve: The aortic valve is tricuspid. Aortic valve regurgitation is not visualized. No aortic stenosis is present. Pulmonic Valve: The pulmonic valve was not well visualized. Pulmonic valve regurgitation is not visualized. No evidence of pulmonic stenosis. Aorta: The aortic root and ascending aorta are structurally normal, with no evidence of dilitation. Venous: The inferior vena cava was not well visualized. IAS/Shunts: The interatrial septum was not well visualized.  LEFT VENTRICLE PLAX 2D LVIDd:         3.60 cm     Diastology LVIDs:         2.40 cm     LV e' medial:    4.24 cm/s LV PW:         1.40 cm     LV E/e' medial:  16.4 LV IVS:        1.10 cm     LV e' lateral:   6.20 cm/s LVOT diam:     2.00 cm     LV E/e' lateral: 11.2 LV SV:         46 LV SV Index:   22 LVOT Area:     3.14 cm  LV Volumes (MOD) LV vol d, MOD A2C: 34.7 ml LV vol d, MOD A4C: 60.5 ml LV vol s, MOD A2C: 13.7 ml LV vol s, MOD A4C: 23.0 ml LV SV MOD A2C:     21.0 ml LV SV MOD A4C:     60.5 ml LV SV MOD BP:      29.1 ml RIGHT VENTRICLE            IVC RV S prime:     8.70 cm/s  IVC diam: 1.50 cm TAPSE (M-mode): 1.2 cm LEFT ATRIUM             Index        RIGHT ATRIUM          Index LA diam:        3.50 cm  1.67 cm/m   RA Area:     9.43 cm LA Vol (A2C):   30.1 ml 14.33 ml/m  RA Volume:   19.90 ml 9.47 ml/m LA Vol (A4C):   29.0 ml 13.80 ml/m LA Biplane Vol: 29.7 ml 14.14 ml/m  AORTIC VALVE LVOT Vmax:   110.00 cm/s LVOT Vmean:  76.000 cm/s LVOT VTI:    0.147 m  AORTA Ao Root diam: 3.00 cm Ao Asc diam:  3.10 cm MITRAL VALVE MV Area (PHT): 2.33 cm     SHUNTS MV Decel Time: 325 msec     Systemic VTI:  0.15 m MV E velocity: 69.50 cm/s   Systemic Diam: 2.00 cm MV A velocity: 108.00 cm/s MV E/A ratio:  0.64 Vishnu Priya Mallipeddi Electronically signed by Winfield Rast Mallipeddi Signature Date/Time: 02/10/2023/4:24:38 PM    Final    US RENAL  Result Date: 02/10/2023 CLINICAL DATA:  [id number] AKI (acute kidney injury) (HCC) [id number] EXAM: RENAL /  URINARY TRACT ULTRASOUND COMPLETE COMPARISON:  CT 11/23/2021 FINDINGS: Right Kidney: Renal measurements: 6.9 x 3.3 x 4.6 cm = volume: 54 mL. Echogenicity within normal limits. No mass or hydronephrosis visualized. Left Kidney: Renal measurements: 7.2 x 4.5 x 4.5 cm = volume: 75 mL. Echogenicity within normal limits. No mass or hydronephrosis visualized. Bladder: Appears normal for degree of bladder distention.There is a 1.3 cm hypoechoic region at the posterior margin of the urinary bladder. Other: 11.1 x 2.9 x 4.8 cm simple appearing cystic collection in the right pelvis, not present on prior CT although VP shunt tubing is noted. Technologist describes technically difficult study secondary to bowel gas and patient breathing. IMPRESSION: 1. No hydronephrosis or other acute finding. 2. 1.3 cm hypoechoic region at the posterior margin of the urinary bladder, possibly a small bladder mass. 3. 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 Electronically Signed   By: Corlis Leak M.D.   On: 02/10/2023 09:56   DG Chest Portable 1 View  Result Date: 02/09/2023 CLINICAL DATA:  SOB EXAM: PORTABLE CHEST - 1 VIEW COMPARISON:  02/14/2021  FINDINGS: Lungs are clear. Heart size and mediastinal contours are within normal limits. No effusion. Visualized bones unremarkable. IMPRESSION: No acute cardiopulmonary disease. Electronically Signed   By: Corlis Leak M.D.   On: 02/09/2023 19:39    Medications: Infusions:  sodium chloride 100 mL/hr at 02/11/23 0041    Scheduled Medications:  atorvastatin  80 mg Oral QHS   Chlorhexidine Gluconate Cloth  6 each Topical Q0600   insulin aspart  0-15 Units Subcutaneous TID WC   insulin aspart  0-5 Units Subcutaneous QHS   insulin aspart  15 Units Subcutaneous TID WC   insulin aspart  3 Units Subcutaneous Once   insulin detemir  50 Units Subcutaneous Daily   mycophenolate  540 mg Oral BID   potassium chloride  40 mEq Oral Once   predniSONE  5 mg Oral Q breakfast   tacrolimus ER  7 mg Oral QAC breakfast    have reviewed scheduled and prn medications.  Physical Exam: General:NAD, comfortable Heart:RRR, s1s2 nl Lungs:clear b/l, no crackle Abdomen:soft, Non-tender, non-distended, no allograft tenderness Extremities:No edema Neurology: Alert, awake and following command  Hector Mahoney Hector Mahoney 02/11/2023,8:28 AM  LOS: 2 days

## 2023-02-11 NOTE — Progress Notes (Signed)
PROGRESS NOTE    Hector Mahoney  ZOX:[id number] DOB: [birth year] DOA: 02/09/2023 PCP: Corwin Levins, MD   Brief Narrative: 52 year old with past medical history significant for insulin-dependent diabetes, s/p renal transplant (12/2021), essential hypertension, chronic systolic heart failure ejection fraction 45 to 50%, hyperlipidemia presents with generalized weakness, found to have hyperglycemia vagal morning, prior creatinine 1.1 on January 14, 2023.  Patient presented with AKI in the setting of hyperglycemic hyperosmolar state.  Assessment & Plan:   Principal Problem:   Hyperglycemia Active Problems:   Essential hypertension   AKI (acute kidney injury) (HCC)   Hyperlipidemia   Generalized weakness   Hypercalcemia   Dehydration   Acute prerenal azotemia   Chronic systolic CHF (congestive heart failure) (HCC)   1-Hyperglycemia Hyperosmolar state Diabetes type 2 uncontrolled: -Unclear etiology, he report poor oral intake few days, but he hasn't been following Carb modified diet.  -De denies chest pain, dyspnea, report mild cough. Denies dysuria.  -Work up for infection: UA negative, Chest x ray negative. Blood cultures. Pending -Treated with insulin Gtt and IV fluids.  -Transition to Levemir, meal coverage and SSI.  -A1c pending.  -Renal US: No hydronephrosis or other acute finding. 1.3 cm hypoechoic region at the posterior margin of the urinary bladder, possibly a small bladder mass. 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 Increase Levemir to 50 units today. Increase meal coverage to 15 units. SSI    1.3 cm hypoechoic region at the posterior margin of the urinary bladder, possibly a small bladder mass. -Needs follow up with urology for possible small bladder mass.  -patient Aware.   -AKI: Likely prerenal in the setting of hypovolemia and hyperglycemia. History of renal transplant. Nephrology consulted.  Continue with  immunosuppressive therapy tacrolimus, mycophenolate and daily prednisone. Follow tacrolimus level Continue to hold losartan Continue with IV fluids.  Improving, Cr down to 1.6  Generalized weakness: -PT/OT consult;  -TSH 0.86, B 12 not low.   Hypercalcemia: -Suspect related to hypovolemia.  -normalizing.   Essential hypertension: -Resume Norvasc  Hyperlipidemia: -Continue with lipitor.   Hyponatremia; pseudohyponatremia in setting hyperglycemia.  Hypokalemia; replete orally.   Chronic systolic heart failure ejection fraction 45 to 50% resolved. Recent ECHO with NL EF Continue to hold diuretics in the setting of AKI. Echo normalized EF. Mild elevation troponin.       Estimated body mass index is 36.26 kg/m as calculated from the following:   Height as of this encounter: 5\' 6"  (1.676 m).   Weight as of this encounter: 101.9 kg.   DVT prophylaxis: SCD Code Status: Full code Family Communication: Wife at bedside 6/30 Disposition Plan:  Status is: Inpatient Remains inpatient appropriate because: management of AKI    Consultants:  Nephrology   Procedures:  Renal US  Antimicrobials:    Subjective: He is alert, conversant, answering questions appropriately today. He is less dazed.   Objective: Vitals:   02/10/23 1616 02/10/23 1949 02/10/23 2053 02/11/23 0602  BP: 135/68 (!) 190/76 (!) 148/82 (!) 131/50  Pulse: (!) 103 94  93  Resp: 16 16  16   Temp: 98 F (36.7 C) (!) 97.5 F (36.4 C)  98 F (36.7 C)  TempSrc: Oral Oral  Oral  SpO2: 99% 99%  99%  Weight:      Height:        Intake/Output Summary (Last 24 hours) at 02/11/2023 0835 Last data filed at 02/11/2023 0600 Gross per 24 hour  Intake  2960.42 ml  Output 1630 ml  Net 1330.42 ml    Filed Weights   02/09/23 2200 02/10/23 0500  Weight: 99.5 kg 101.9 kg    Examination:  General exam: NAD Respiratory system: CTA Cardiovascular system: S1, S 2 RRR Gastrointestinal system: BS present, soft,  nt Central nervous system: Alert Extremities: Symmetric 5 x 5 power.   Data Reviewed: I have personally reviewed following labs and imaging studies  CBC: Recent Labs  Lab 02/09/23 1907 02/10/23 0227  WBC 6.0 6.8  NEUTROABS 4.0 4.8  HGB 17.0  14.9 14.7  HCT 50.0  45.3 44.9  MCV 79.5* 79.3*  PLT 172 191    Basic Metabolic Panel: Recent Labs  Lab 02/09/23 2217 02/10/23 0153 02/10/23 0155 02/10/23 0826 02/10/23 1151 02/10/23 2156 02/11/23 0454  NA  --  130* 132* 132* 131* 131* 134*  K  --  2.8* 2.9* 3.3* 3.5 3.7 3.4*  CL  --  93* 94* 97* 97* 99 102  CO2  --  25 26 23 23 24  20*  GLUCOSE  --  324* 327* 176* 167* 380* 231*  BUN  --  56* 56* 53* 49* 48* 38*  CREATININE  --  2.12* 2.11* 1.92* 1.86* 1.83* 1.64*  CALCIUM  --  10.3 10.3 10.2 10.0 9.6 9.5  MG 2.5* 2.4  --   --   --   --   --   PHOS  --  2.4*  --   --   --   --   --     GFR: Estimated Creatinine Clearance: 60.2 mL/min (A) (by C-G formula based on SCr of 1.64 mg/dL (H)). Liver Function Tests: Recent Labs  Lab 02/09/23 1907 02/10/23 0153  AST 20 22  ALT 37 39  ALKPHOS 154* 139*  BILITOT 1.2 0.8  PROT 8.0 7.7  ALBUMIN 4.5 4.1    No results for input(s): "LIPASE", "AMYLASE" in the last 168 hours. No results for input(s): "AMMONIA" in the last 168 hours. Coagulation Profile: No results for input(s): "INR", "PROTIME" in the last 168 hours. Cardiac Enzymes: Recent Labs  Lab 02/10/23 0153  CKTOTAL 169    BNP (last 3 results) No results for input(s): "PROBNP" in the last 8760 hours. HbA1C: No results for input(s): "HGBA1C" in the last 72 hours. CBG: Recent Labs  Lab 02/10/23 1853 02/10/23 1950 02/10/23 2251 02/11/23 0237 02/11/23 0745  GLUCAP 492* 457* 294* 248* 232*    Lipid Profile: No results for input(s): "CHOL", "HDL", "LDLCALC", "TRIG", "CHOLHDL", "LDLDIRECT" in the last 72 hours. Thyroid Function Tests: Recent Labs    02/10/23 0155  TSH 0.869    Anemia Panel: Recent Labs     02/10/23 0153  VITAMINB12 2,940*    Sepsis Labs: Recent Labs  Lab 02/10/23 0155  PROCALCITON 0.11     Recent Results (from the past 240 hour(s))  MRSA Next Gen by PCR, Nasal     Status: None   Collection Time: 02/10/23  2:32 AM   Specimen: Nasal Mucosa; Nasal Swab  Result Value Ref Range Status   MRSA by PCR Next Gen NOT DETECTED NOT DETECTED Final    Comment: (NOTE) The GeneXpert MRSA Assay (FDA approved for NASAL specimens only), is one component of a comprehensive MRSA colonization surveillance program. It is not intended to diagnose MRSA infection nor to guide or monitor treatment for MRSA infections. Test performance is not FDA approved in patients less than 36 years old. Performed at [facility], [address] [place], [place] [address]          Radiology Studies: ECHOCARDIOGRAM COMPLETE  Result Date: 02/10/2023    ECHOCARDIOGRAM REPORT   Patient Name:   DREWEY HOLEN Date of Exam: 02/10/2023 Medical Rec #:  [id number]          Height:       66.0 in Accession #:    [id number]         Weight:       224.6 lb Date of Birth:  [birth year]          BSA:          2.101 m Patient Age:    52 years           BP:           127/63 mmHg Patient Gender: M                  HR:           95 bpm. Exam Location:  Inpatient Procedure: 2D Echo, Cardiac Doppler and Color Doppler Indications:    Abnormal ECG  History:        Patient has prior history of Echocardiogram examinations, most                 recent 03/01/2021. CHF; Risk Factors:Diabetes, Hypertension and                 Dyslipidemia. S/p renal transplant.  Sonographer:    Milda Smart Referring Phys: [id number] Calden Dorsey A Corlette Ciano  Sonographer Comments: Image acquisition challenging due to patient body habitus and Image acquisition challenging due to respiratory motion. IMPRESSIONS  1. Left ventricular ejection fraction, by estimation, is 60 to 65%. The left ventricle has normal function. The left ventricle has  no regional wall motion abnormalities. There is moderate asymmetric left ventricular hypertrophy of the lateral segment. Left ventricular diastolic parameters are consistent with Grade I diastolic dysfunction (impaired relaxation).  2. Right ventricular systolic function was not well visualized. The right ventricular size is normal. Tricuspid regurgitation signal is inadequate for assessing PA pressure.  3. The mitral valve is normal in structure. No evidence of mitral valve regurgitation. No evidence of mitral stenosis.  4. The aortic valve is tricuspid. Aortic valve regurgitation is not visualized. No aortic stenosis is present. Comparison(s): Changes from prior study are noted. LVEF improved from 45-50% to 60-65% now. FINDINGS  Left Ventricle: Left ventricular ejection fraction, by estimation, is 60 to 65%. The left ventricle has normal function. The left ventricle has no regional wall motion abnormalities. The left ventricular internal cavity size was normal in size. There is  moderate asymmetric left ventricular hypertrophy of the lateral segment. Left ventricular diastolic parameters are consistent with Grade I diastolic dysfunction (impaired relaxation). Right Ventricle: The right ventricular size is normal. No increase in right ventricular wall thickness. Right ventricular systolic function was not well visualized. Tricuspid regurgitation signal is inadequate for assessing PA pressure. Left Atrium: Left atrial size was normal in size. Right Atrium: Right atrial size was normal in size. Pericardium: Trivial pericardial effusion is present. Mitral Valve: The mitral valve is normal in structure. There is moderate calcification of the mitral valve leaflet(s). Mild mitral annular calcification. No evidence of mitral valve regurgitation. No evidence of mitral valve stenosis. Tricuspid Valve: The tricuspid valve is grossly normal. Tricuspid valve regurgitation is not demonstrated. No evidence of tricuspid stenosis.  Aortic Valve: The aortic valve is tricuspid. Aortic valve regurgitation is  not visualized. No aortic stenosis is present. Pulmonic Valve: The pulmonic valve was not well visualized. Pulmonic valve regurgitation is not visualized. No evidence of pulmonic stenosis. Aorta: The aortic root and ascending aorta are structurally normal, with no evidence of dilitation. Venous: The inferior vena cava was not well visualized. IAS/Shunts: The interatrial septum was not well visualized.  LEFT VENTRICLE PLAX 2D LVIDd:         3.60 cm     Diastology LVIDs:         2.40 cm     LV e' medial:    4.24 cm/s LV PW:         1.40 cm     LV E/e' medial:  16.4 LV IVS:        1.10 cm     LV e' lateral:   6.20 cm/s LVOT diam:     2.00 cm     LV E/e' lateral: 11.2 LV SV:         46 LV SV Index:   22 LVOT Area:     3.14 cm  LV Volumes (MOD) LV vol d, MOD A2C: 34.7 ml LV vol d, MOD A4C: 60.5 ml LV vol s, MOD A2C: 13.7 ml LV vol s, MOD A4C: 23.0 ml LV SV MOD A2C:     21.0 ml LV SV MOD A4C:     60.5 ml LV SV MOD BP:      29.1 ml RIGHT VENTRICLE            IVC RV S prime:     8.70 cm/s  IVC diam: 1.50 cm TAPSE (M-mode): 1.2 cm LEFT ATRIUM             Index        RIGHT ATRIUM          Index LA diam:        3.50 cm 1.67 cm/m   RA Area:     9.43 cm LA Vol (A2C):   30.1 ml 14.33 ml/m  RA Volume:   19.90 ml 9.47 ml/m LA Vol (A4C):   29.0 ml 13.80 ml/m LA Biplane Vol: 29.7 ml 14.14 ml/m  AORTIC VALVE LVOT Vmax:   110.00 cm/s LVOT Vmean:  76.000 cm/s LVOT VTI:    0.147 m  AORTA Ao Root diam: 3.00 cm Ao Asc diam:  3.10 cm MITRAL VALVE MV Area (PHT): 2.33 cm     SHUNTS MV Decel Time: 325 msec     Systemic VTI:  0.15 m MV E velocity: 69.50 cm/s   Systemic Diam: 2.00 cm MV A velocity: 108.00 cm/s MV E/A ratio:  0.64 Vishnu Priya Mallipeddi Electronically signed by Winfield Rast Mallipeddi Signature Date/Time: 02/10/2023/4:24:38 PM    Final    US RENAL  Result Date: 02/10/2023 CLINICAL DATA:  [id number] AKI (acute kidney injury) (HCC) [id number] EXAM:  RENAL / URINARY TRACT ULTRASOUND COMPLETE COMPARISON:  CT 11/23/2021 FINDINGS: Right Kidney: Renal measurements: 6.9 x 3.3 x 4.6 cm = volume: 54 mL. Echogenicity within normal limits. No mass or hydronephrosis visualized. Left Kidney: Renal measurements: 7.2 x 4.5 x 4.5 cm = volume: 75 mL. Echogenicity within normal limits. No mass or hydronephrosis visualized. Bladder: Appears normal for degree of bladder distention.There is a 1.3 cm hypoechoic region at the posterior margin of the urinary bladder. Other: 11.1 x 2.9 x 4.8 cm simple appearing cystic collection in the right pelvis, not present on prior CT although VP shunt tubing is noted. Technologist describes technically  difficult study secondary to bowel gas and patient breathing. IMPRESSION: 1. No hydronephrosis or other acute finding. 2. 1.3 cm hypoechoic region at the posterior margin of the urinary bladder, possibly a small bladder mass. 3. 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 Electronically Signed   By: Corlis Leak M.D.   On: 02/10/2023 09:56   DG Chest Portable 1 View  Result Date: 02/09/2023 CLINICAL DATA:  SOB EXAM: PORTABLE CHEST - 1 VIEW COMPARISON:  02/14/2021 FINDINGS: Lungs are clear. Heart size and mediastinal contours are within normal limits. No effusion. Visualized bones unremarkable. IMPRESSION: No acute cardiopulmonary disease. Electronically Signed   By: Corlis Leak M.D.   On: 02/09/2023 19:39        Scheduled Meds:  atorvastatin  80 mg Oral QHS   Chlorhexidine Gluconate Cloth  6 each Topical Q0600   insulin aspart  0-15 Units Subcutaneous TID WC   insulin aspart  0-5 Units Subcutaneous QHS   insulin aspart  15 Units Subcutaneous TID WC   insulin aspart  3 Units Subcutaneous Once   insulin detemir  50 Units Subcutaneous Daily   mycophenolate  540 mg Oral BID   potassium chloride  40 mEq Oral Once   predniSONE  5 mg Oral Q breakfast   tacrolimus ER  7 mg Oral QAC  breakfast   Continuous Infusions:  sodium chloride 100 mL/hr at 02/11/23 0041     LOS: 2 days    Time spent: 35 minutes    Berlie Hatchel A Khylon Davies, MD [facility]   If 7PM-7AM, please contact night-coverage [email or web address]  02/11/2023, 8:35 AM

## 2023-02-11 NOTE — Inpatient Diabetes Management (Signed)
Inpatient Diabetes Program Recommendations  AACE/ADA: New Consensus Statement on Inpatient Glycemic Control (2015)  Target Ranges:  Prepandial:   less than 140 mg/dL      Peak postprandial:   less than 180 mg/dL (1-2 hours)      Critically ill patients:  140 - 180 mg/dL   Lab Results  Component Value Date   GLUCAP 241 (H) 02/11/2023   HGBA1C 8.8 (A) 09/24/2022    Review of Glycemic Control  Diabetes history: DM2 Outpatient Diabetes medications: Lantus 50 at bedtime, Novolog 30 TID, Actos 30 every day, Farxiga 10 every day, Prednisone 5 mg QD Current orders for Inpatient glycemic control: Levemir 50 every day, Novolog 0-15 units TID and 0-5 HS + 15 units TID  Endocrinologist - Shamleffer, last visit 01/25/23 Both basal and bolus insulins titrated today.  Inpatient Diabetes Program Recommendations:    Consider increasing Novolog to 18 units TID while on steroids.   Follow.  Thank you. Ailene Ards, RD, LDN, CDCES Inpatient Diabetes Coordinator [phone]

## 2023-02-11 NOTE — TOC CM/SW Note (Signed)
Transition of Care [facility]) - Inpatient Brief Assessment   Patient Details  Name: CAISON VERNIER MRN: [id number] Date of Birth: 1971/01/15  Transition of Care [facility]) CM/SW Contact:    Otelia Santee, LCSW Phone Number: 02/11/2023, 1:03 PM   Clinical Narrative: Chart reviewed. No TOC needs identified.    Transition of Care Asessment: Insurance and Status: Insurance coverage has been reviewed Patient has primary care physician: Yes Home environment has been reviewed: Home w/ spouse Prior level of function:: Independent Prior/Current Home Services: No current home services Social Determinants of Health Reivew: SDOH reviewed no interventions necessary Readmission risk has been reviewed: Yes Transition of care needs: no transition of care needs at this time

## 2023-02-12 ENCOUNTER — Other Ambulatory Visit: Payer: Self-pay

## 2023-02-12 DIAGNOSIS — I10 Essential (primary) hypertension: Secondary | ICD-10-CM | POA: Diagnosis not present

## 2023-02-12 DIAGNOSIS — E1100 Type 2 diabetes mellitus with hyperosmolarity without nonketotic hyperglycemic-hyperosmolar coma (NKHHC): Secondary | ICD-10-CM | POA: Diagnosis not present

## 2023-02-12 DIAGNOSIS — I5022 Chronic systolic (congestive) heart failure: Secondary | ICD-10-CM | POA: Diagnosis not present

## 2023-02-12 DIAGNOSIS — R739 Hyperglycemia, unspecified: Secondary | ICD-10-CM | POA: Diagnosis not present

## 2023-02-12 DIAGNOSIS — N179 Acute kidney failure, unspecified: Secondary | ICD-10-CM | POA: Diagnosis not present

## 2023-02-12 LAB — GLUCOSE, CAPILLARY
Glucose-Capillary: 281 mg/dL — ABNORMAL HIGH (ref 70–99)
Glucose-Capillary: 206 mg/dL — ABNORMAL HIGH (ref 70–99)

## 2023-02-12 LAB — RENAL FUNCTION PANEL
Albumin: 3.6 g/dL (ref 3.5–5.0)
Anion gap: 8 (ref 5–15)
BUN: 26 mg/dL — ABNORMAL HIGH (ref 6–20)
CO2: 19 mmol/L — ABNORMAL LOW (ref 22–32)
Calcium: 9.4 mg/dL (ref 8.9–10.3)
Chloride: 108 mmol/L (ref 98–111)
Creatinine, Ser: 1.33 mg/dL — ABNORMAL HIGH (ref 0.61–1.24)
GFR, Estimated: >60 mL/min (ref >60)
Glucose, Bld: 247 mg/dL — ABNORMAL HIGH (ref 70–99)
Phosphorus: 2.1 mg/dL — ABNORMAL LOW (ref 2.5–4.6)
Potassium: 3.5 mmol/L (ref 3.5–5.1)
Sodium: 135 mmol/L (ref 135–145)

## 2023-02-12 LAB — CULTURE, BLOOD (ROUTINE X 2): Culture: NO GROWTH

## 2023-02-12 LAB — TACROLIMUS LEVEL: Tacrolimus (FK506) - LabCorp: 7.4 ng/mL (ref 2.0–20.0)

## 2023-02-12 MED ORDER — INSULIN ASPART 100 UNIT/ML IJ SOLN
18.0000 [IU] | Freq: Three times a day (TID) | INTRAMUSCULAR | Status: DC
  Administered 2023-02-12: 18 [IU] via SUBCUTANEOUS

## 2023-02-12 MED ORDER — INSULIN ASPART 100 UNIT/ML FLEXPEN: 25.0000 [IU] | PEN_INJECTOR | Freq: Three times a day (TID) | SUBCUTANEOUS | 11 refills | Status: DC

## 2023-02-12 MED ORDER — INSULIN ASPART 100 UNIT/ML ~~LOC~~ SOLN: 25.0000 [IU] | Freq: Three times a day (TID) | SUBCUTANEOUS | 0 refills | Status: DC

## 2023-02-12 MED ORDER — K PHOS MONO-SOD PHOS DI & MONO 155-852-130 MG PO TABS
500.0000 mg | ORAL_TABLET | Freq: Two times a day (BID) | ORAL | Status: DC
  Filled 2023-02-12: qty 2

## 2023-02-12 MED ORDER — INSULIN GLARGINE 100 UNIT/ML ~~LOC~~ SOLN: 50.0000 [IU] | Freq: Every day | SUBCUTANEOUS | Status: DC

## 2023-02-12 MED ORDER — K PHOS MONO-SOD PHOS DI & MONO 155-852-130 MG PO TABS: 500.0000 mg | ORAL_TABLET | Freq: Two times a day (BID) | ORAL | 0 refills | Status: AC

## 2023-02-12 MED ORDER — FUROSEMIDE 80 MG PO TABS: 40.0000 mg | ORAL_TABLET | Freq: Every day | ORAL | 0 refills | Status: AC

## 2023-02-12 MED ORDER — INSULIN GLARGINE 100 UNIT/ML SOLOSTAR PEN: 50.0000 [IU] | PEN_INJECTOR | Freq: Every day | SUBCUTANEOUS | 11 refills | Status: DC

## 2023-02-12 NOTE — Progress Notes (Signed)
[facility] NEPHROLOGY PROGRESS NOTE  Assessment/ Plan: Pt is a 52 y.o. yo male with history of ESRD status post kidney transplant at [facility] on 12/28/2021 presented with generalized weakness, fatigue in the setting of using higher dose of diuretics.  He was hypertensive in the ER with systolic BP in 90s.  # Acute kidney injury in transplanted kidney due to intravascular volume depletion/hemodynamically mediated due to hyperglycemia hyperosmolar state and higher use of diuretics at home.  Baseline creatinine around 1.0-1.2.  Creatinine level was around 2.4 on admission which is gradually improving to 1.33 today.  UA bland except glucosuria.  Transplant kidney ultrasound with normal-appearing transplanted kidney however there was evidence of complex fluid collection adjacent to the transplant. Discontinue IV fluid He was on Lasix 80 mg and metolazone at home.  Recommend to discontinue metolazone and lower furosemide to 40 mg a day on discharge. I recommend him to follow-up with the transplant team in 1 to 2 weeks after discharge.  # Kidney transplant in 12/2021 at [facility].  He is still followed by the transplant team.  Continue current immunosuppression including Prograf, prednisone and Myfortic.  # Possible urinary bladder mass and complex fluid collection adjacent to the transplant kidney.  Patient is clinically asymptomatic.  Renal function is improving.  Patient is aware of those findings.  I have recommended patient to follow-up with urologist and transplant nephrology team as outpatient.  # Volume depletion/dehydration in the setting of recent use of diuretics and hyperglycemia.  Treated with IV fluid.  Need to lower diuretics dose.  # Chronic systolic CHF: Echo with EF of 60 to 65%.    Sign off, please call us back with question.  Subjective: Seen and examined at bedside.  Clinically feels good.  Denies nausea, vomiting, chest pain, shortness of breath.  Objective Vital signs in  last 24 hours: Vitals:   02/11/23 1508 02/11/23 1947 02/12/23 0155 02/12/23 0500  BP: 131/65 (!) 117/91 (!) 131/57   Pulse: 97 96 94   Resp: 18 18 19    Temp: 98.9 F (37.2 C) 98.7 F (37.1 C) 98.8 F (37.1 C)   TempSrc:      SpO2: 100% 100% 98%   Weight:    105.8 kg  Height:       Weight change:   Intake/Output Summary (Last 24 hours) at 02/12/2023 0842 Last data filed at 02/11/2023 1500 Gross per 24 hour  Intake 659.25 ml  Output --  Net 659.25 ml        Labs: RENAL PANEL Recent Labs  Lab 02/09/23 1907 02/09/23 2217 02/10/23 0153 02/10/23 0155 02/10/23 0826 02/10/23 1151 02/10/23 2156 02/11/23 0454 02/12/23 0546  NA 124*  124*  --  130*   < > 132* 131* 131* 134* 135  K 4.0  4.1  --  2.8*   < > 3.3* 3.5 3.7 3.4* 3.5  CL 83*  --  93*   < > 97* 97* 99 102 108  CO2 27  --  25   < > 23 23 24  20* 19*  GLUCOSE 735*  --  324*   < > 176* 167* 380* 231* 247*  BUN 59*  --  56*   < > 53* 49* 48* 38* 26*  CREATININE 2.49*  --  2.12*   < > 1.92* 1.86* 1.83* 1.64* 1.33*  CALCIUM 10.7*  --  10.3   < > 10.2 10.0 9.6 9.5 9.4  MG  --  2.5* 2.4  --   --   --   --   --   --  PHOS  --   --  2.4*  --   --   --   --   --  2.1*  ALBUMIN 4.5  --  4.1  --   --   --   --   --  3.6   < > = values in this interval not displayed.     Liver Function Tests: Recent Labs  Lab 02/09/23 1907 02/10/23 0153 02/12/23 0546  AST 20 22  --   ALT 37 39  --   ALKPHOS 154* 139*  --   BILITOT 1.2 0.8  --   PROT 8.0 7.7  --   ALBUMIN 4.5 4.1 3.6    No results for input(s): "LIPASE", "AMYLASE" in the last 168 hours. No results for input(s): "AMMONIA" in the last 168 hours. CBC: Recent Labs    02/09/23 1907 02/10/23 0153 02/10/23 0227  HGB 17.0  14.9  --  14.7  MCV 79.5*  --  79.3*  VITAMINB12  --  2,940*  --      Cardiac Enzymes: Recent Labs  Lab 02/10/23 0153  CKTOTAL 169    CBG: Recent Labs  Lab 02/11/23 0745 02/11/23 1213 02/11/23 1639 02/11/23 2208 02/12/23 0806   GLUCAP 232* 241* 225* 239* 281*     Iron Studies: No results for input(s): "IRON", "TIBC", "TRANSFERRIN", "FERRITIN" in the last 72 hours. Studies/Results: US Renal Transplant w/Doppler  Result Date: 02/11/2023 CLINICAL DATA:  Acute kidney injury EXAM: ULTRASOUND OF RENAL TRANSPLANT WITH RENAL DOPPLER ULTRASOUND TECHNIQUE: Ultrasound examination of the renal transplant was performed with gray-scale, color and duplex doppler evaluation. COMPARISON:  02/10/2023 and previous FINDINGS: Transplant kidney location: LLQ Transplant Kidney: Renal measurements: 12.7 x 7 x 7.8 cm = volume: . Normal in size and parenchymal echogenicity. No evidence of mass or hydronephrosis. Trace peri-transplant fluid . Color flow in the main renal artery:  Yes Color flow in the main renal vein:  Yes Duplex Doppler Evaluation: Main Renal Artery Velocity: 77 cm/sec Main Renal Artery Resistive Index: 0.67 Venous waveform in main renal vein:  Present Intrarenal resistive index in upper pole:  0.63 (normal 0.6-0.8; equivocal 0.8-0.9; abnormal >= 0.9) Intrarenal resistive index in lower pole: 0.63 (normal 0.6-0.8; equivocal 0.8-0.9; abnormal >= 0.9) Bladder: Normal for degree of bladder distention. Other findings: Complex fluid collection adjacent to transplant 6.2 x 5.3 x 5.4 cm IMPRESSION: 1. Normal appearance of transplant kidney. 2. Complex 6.2 cm fluid collection adjacent to transplant Electronically Signed   By: Corlis Leak M.D.   On: 02/11/2023 09:04   ECHOCARDIOGRAM COMPLETE  Result Date: 02/10/2023    ECHOCARDIOGRAM REPORT   Patient Name:   Hector Mahoney Date of Exam: 02/10/2023 Medical Rec #:  [id number]          Height:       66.0 in Accession #:    [id number]         Weight:       224.6 lb Date of Birth:  November 25, 1970          BSA:          2.101 m Patient Age:    52 years           BP:           127/63 mmHg Patient Gender: M                  HR:           95 bpm. Exam Location:  Inpatient Procedure: 2D Echo, Cardiac  Doppler and Color Doppler Indications:    Abnormal ECG  History:        Patient has prior history of Echocardiogram examinations, most                 recent 03/01/2021. CHF; Risk Factors:Diabetes, Hypertension and                 Dyslipidemia. S/p renal transplant.  Sonographer:    Milda Smart Referring Phys: [id number] BELKYS A REGALADO  Sonographer Comments: Image acquisition challenging due to patient body habitus and Image acquisition challenging due to respiratory motion. IMPRESSIONS  1. Left ventricular ejection fraction, by estimation, is 60 to 65%. The left ventricle has normal function. The left ventricle has no regional wall motion abnormalities. There is moderate asymmetric left ventricular hypertrophy of the lateral segment. Left ventricular diastolic parameters are consistent with Grade I diastolic dysfunction (impaired relaxation).  2. Right ventricular systolic function was not well visualized. The right ventricular size is normal. Tricuspid regurgitation signal is inadequate for assessing PA pressure.  3. The mitral valve is normal in structure. No evidence of mitral valve regurgitation. No evidence of mitral stenosis.  4. The aortic valve is tricuspid. Aortic valve regurgitation is not visualized. No aortic stenosis is present. Comparison(s): Changes from prior study are noted. LVEF improved from 45-50% to 60-65% now. FINDINGS  Left Ventricle: Left ventricular ejection fraction, by estimation, is 60 to 65%. The left ventricle has normal function. The left ventricle has no regional wall motion abnormalities. The left ventricular internal cavity size was normal in size. There is  moderate asymmetric left ventricular hypertrophy of the lateral segment. Left ventricular diastolic parameters are consistent with Grade I diastolic dysfunction (impaired relaxation). Right Ventricle: The right ventricular size is normal. No increase in right ventricular wall thickness. Right ventricular systolic function was  not well visualized. Tricuspid regurgitation signal is inadequate for assessing PA pressure. Left Atrium: Left atrial size was normal in size. Right Atrium: Right atrial size was normal in size. Pericardium: Trivial pericardial effusion is present. Mitral Valve: The mitral valve is normal in structure. There is moderate calcification of the mitral valve leaflet(s). Mild mitral annular calcification. No evidence of mitral valve regurgitation. No evidence of mitral valve stenosis. Tricuspid Valve: The tricuspid valve is grossly normal. Tricuspid valve regurgitation is not demonstrated. No evidence of tricuspid stenosis. Aortic Valve: The aortic valve is tricuspid. Aortic valve regurgitation is not visualized. No aortic stenosis is present. Pulmonic Valve: The pulmonic valve was not well visualized. Pulmonic valve regurgitation is not visualized. No evidence of pulmonic stenosis. Aorta: The aortic root and ascending aorta are structurally normal, with no evidence of dilitation. Venous: The inferior vena cava was not well visualized. IAS/Shunts: The interatrial septum was not well visualized.  LEFT VENTRICLE PLAX 2D LVIDd:         3.60 cm     Diastology LVIDs:         2.40 cm     LV e' medial:    4.24 cm/s LV PW:         1.40 cm     LV E/e' medial:  16.4 LV IVS:        1.10 cm     LV e' lateral:   6.20 cm/s LVOT diam:     2.00 cm     LV E/e' lateral: 11.2 LV SV:         46 LV SV Index:  22 LVOT Area:     3.14 cm  LV Volumes (MOD) LV vol d, MOD A2C: 34.7 ml LV vol d, MOD A4C: 60.5 ml LV vol s, MOD A2C: 13.7 ml LV vol s, MOD A4C: 23.0 ml LV SV MOD A2C:     21.0 ml LV SV MOD A4C:     60.5 ml LV SV MOD BP:      29.1 ml RIGHT VENTRICLE            IVC RV S prime:     8.70 cm/s  IVC diam: 1.50 cm TAPSE (M-mode): 1.2 cm LEFT ATRIUM             Index        RIGHT ATRIUM          Index LA diam:        3.50 cm 1.67 cm/m   RA Area:     9.43 cm LA Vol (A2C):   30.1 ml 14.33 ml/m  RA Volume:   19.90 ml 9.47 ml/m LA Vol (A4C):    29.0 ml 13.80 ml/m LA Biplane Vol: 29.7 ml 14.14 ml/m  AORTIC VALVE LVOT Vmax:   110.00 cm/s LVOT Vmean:  76.000 cm/s LVOT VTI:    0.147 m  AORTA Ao Root diam: 3.00 cm Ao Asc diam:  3.10 cm MITRAL VALVE MV Area (PHT): 2.33 cm     SHUNTS MV Decel Time: 325 msec     Systemic VTI:  0.15 m MV E velocity: 69.50 cm/s   Systemic Diam: 2.00 cm MV A velocity: 108.00 cm/s MV E/A ratio:  0.64 Vishnu Priya Mallipeddi Electronically signed by Winfield Rast Mallipeddi Signature Date/Time: 02/10/2023/4:24:38 PM    Final    US RENAL  Result Date: 02/10/2023 CLINICAL DATA:  [id number] AKI (acute kidney injury) (HCC) [id number] EXAM: RENAL / URINARY TRACT ULTRASOUND COMPLETE COMPARISON:  CT 11/23/2021 FINDINGS: Right Kidney: Renal measurements: 6.9 x 3.3 x 4.6 cm = volume: 54 mL. Echogenicity within normal limits. No mass or hydronephrosis visualized. Left Kidney: Renal measurements: 7.2 x 4.5 x 4.5 cm = volume: 75 mL. Echogenicity within normal limits. No mass or hydronephrosis visualized. Bladder: Appears normal for degree of bladder distention.There is a 1.3 cm hypoechoic region at the posterior margin of the urinary bladder. Other: 11.1 x 2.9 x 4.8 cm simple appearing cystic collection in the right pelvis, not present on prior CT although VP shunt tubing is noted. Technologist describes technically difficult study secondary to bowel gas and patient breathing. IMPRESSION: 1. No hydronephrosis or other acute finding. 2. 1.3 cm hypoechoic region at the posterior margin of the urinary bladder, possibly a small bladder mass. 3. 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 Electronically Signed   By: Corlis Leak M.D.   On: 02/10/2023 09:56    Medications: Infusions:  sodium chloride Stopped (02/12/23 0821)    Scheduled Medications:  atorvastatin  80 mg Oral QHS   Chlorhexidine Gluconate Cloth  6 each Topical Q0600   insulin aspart  0-15 Units Subcutaneous TID WC   insulin aspart   0-5 Units Subcutaneous QHS   insulin aspart  18 Units Subcutaneous TID WC   insulin detemir  50 Units Subcutaneous Daily   mycophenolate  540 mg Oral BID   pantoprazole  40 mg Oral Daily   predniSONE  5 mg Oral Q breakfast   tacrolimus ER  7 mg Oral QAC breakfast    have reviewed scheduled  and prn medications.  Physical Exam: General:NAD, comfortable Heart:RRR, s1s2 nl Lungs:clear b/l, no crackle Abdomen:soft, Non-tender, non-distended, no allograft tenderness Extremities:No peripheral edema Neurology: Alert, awake and following command  Brunella Wileman Jaynie Collins 02/12/2023,8:42 AM  LOS: 3 days

## 2023-02-12 NOTE — Progress Notes (Signed)
Nutrition Note  RD consulted for nutrition education regarding diabetes.   Lab Results  Component Value Date   HGBA1C 13.2 (H) 02/10/2023    RD provided "Carbohydrate Counting for People with Diabetes" handout from the Academy of [employer]etics. Discussed different food groups and their effects on blood sugar, emphasizing carbohydrate-containing foods. Provided list of carbohydrates and recommended serving sizes of common foods.  Discussed importance of controlled and consistent carbohydrate intake throughout the day. Provided examples of ways to balance meals/snacks and encouraged intake of high-fiber, whole grain complex carbohydrates. Teach back method used.  Expect good compliance. Pt with good support from his spouse. Spouse on the phone during education. Answered their diet related questions. Would like an outpatient referral placed for continued education.  Body mass index is 37.65 kg/m. Pt meets criteria for normal based on current BMI.  Current diet order is CHO modified, patient is consuming approximately 100% of meals at this time. Labs and medications reviewed. No further nutrition interventions warranted at this time.  If additional nutrition issues arise, please re-consult RD.  Tilda Franco, MS, RD, LDN Inpatient Clinical Dietitian Contact information available via Amion

## 2023-02-12 NOTE — Discharge Summary (Signed)
Physician Discharge Summary   Patient: Hector Mahoney MRN: [id number] DOB: April 09, 1971  Admit date:     02/09/2023  Discharge date: 02/12/23  Discharge Physician: Alba Cory   PCP: Corwin Levins, MD   Recommendations at discharge:    Close follow up with Renal transplant team for complex lesion near transplant kidney.  Needs Urology evaluation for small bladder mass.  Close  follow up with endocrinologist for further adjustment of medication for diabetes. Encourage compliance  Follow up with nephrologist for further care of AKI  Discharge Diagnoses: Principal Problem:   Hyperglycemia Active Problems:   Essential hypertension   AKI (acute kidney injury) (HCC)   Hyperlipidemia   Generalized weakness   Hypercalcemia   Dehydration   Acute prerenal azotemia   Chronic systolic CHF (congestive heart failure) (HCC)  Resolved Problems:   * No resolved hospital problems. [facility] Course: 52 year old with past medical history significant for insulin-dependent diabetes, s/p renal transplant (12/2021), essential hypertension, chronic systolic heart failure ejection fraction 45 to 50%, hyperlipidemia presents with generalized weakness, found to have hyperglycemia vagal morning, prior creatinine 1.1 on January 14, 2023.   Patient presented with AKI in the setting of hyperglycemic hyperosmolar state.    Assessment and Plan: 1-Hyperglycemia Hyperosmolar state Diabetes type 2 uncontrolled: -Unclear etiology, he report poor oral intake few days, but he hasn't been following Carb modified diet.  -De denies chest pain, dyspnea, report mild cough. Denies dysuria.  -Work up for infection: UA negative, Chest x ray negative. Blood cultures. Pending -Treated with insulin Gtt and IV fluids.  -He  will be discharge on Lantus 50 units daily, Novolog 25 units with meals. Resume oral meds.  -A1c 13, discussed importance if good Diabetes controlled.     1.3 cm hypoechoic region at the  posterior margin of the urinary bladder, possibly a small bladder mass. -Needs follow up with urology for possible small bladder mass.  -patient Aware.    -AKI: -Renal US: No hydronephrosis or other acute finding. 1.3 cm hypoechoic region at the posterior margin of the urinary bladder, possibly a small bladder mass. 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 Likely prerenal in the setting of hypovolemia and hyperglycemia. History of renal transplant. Nephrology consulted.  Continue with immunosuppressive therapy tacrolimus, mycophenolate and daily prednisone. Follow tacrolimus level Continue to hold losartan Treated  with IV fluids.  Cr down to 1.3 Ok to resume lasix at discharge per nephrology at 40 mg daily   Generalized weakness: -PT/OT consult;  -TSH 0.86, B 12 not low.    Hypercalcemia: -Suspect related to hypovolemia.  -normalizing.    Essential hypertension: -Resume Norvasc   Hyperlipidemia: -Continue with lipitor.    Hyponatremia; pseudohyponatremia in setting hyperglycemia.  Hypokalemia; replete orally.    Chronic systolic heart failure ejection fraction 45 to 50% resolved. Recent ECHO with NL EF Continue to hold diuretics in the setting of AKI. Echo normalized EF. Mild elevation troponin.                 Consultants: Nephrology  Procedures performed: Renal US Disposition: Home Diet recommendation:  Discharge Diet Orders (From admission, onward)     Start     Ordered   02/12/23 0000  Diet Carb Modified        02/12/23 1208           Carb modified diet DISCHARGE MEDICATION: Allergies as of 02/12/2023   No Active Allergies  Medication List     STOP taking these medications    cetirizine 10 MG tablet Commonly known as: ZYRTEC   clonazePAM 0.5 MG tablet Commonly known as: KLONOPIN   dicyclomine 20 MG tablet Commonly known as: BENTYL   HYDROcodone-acetaminophen 5-325 MG  tablet Commonly known as: Norco   levETIRAcetam 250 MG tablet Commonly known as: Keppra   meclizine 12.5 MG tablet Commonly known as: ANTIVERT   ondansetron 4 MG tablet Commonly known as: Zofran       TAKE these medications    amLODipine 10 MG tablet Commonly known as: NORVASC Take 1 tablet by mouth daily.   atorvastatin 80 MG tablet Commonly known as: LIPITOR Take 80 mg by mouth at bedtime.   carvedilol 25 MG tablet Commonly known as: Coreg Take 1 tablet (25 mg total) by mouth 2 (two) times daily. What changed:  how much to take when to take this   Envarsus XR 4 MG Tb24 Generic drug: tacrolimus ER Take 4 mg by mouth daily before breakfast.   Envarsus XR 1 MG Tb24 Generic drug: tacrolimus ER Take 3 mg by mouth daily before breakfast.   Flomax 0.4 MG Caps capsule Generic drug: tamsulosin Take 0.4 mg by mouth at bedtime.   furosemide 80 MG tablet Commonly known as: LASIX Take 0.5 tablets (40 mg total) by mouth daily. What changed:  how much to take when to take this   insulin aspart 100 UNIT/ML injection Commonly known as: novoLOG Inject 25 Units into the skin 3 (three) times daily with meals. What changed: See the new instructions.   insulin glargine 100 UNIT/ML injection Commonly known as: Lantus Inject 0.5 mLs (50 Units total) into the skin daily. What changed:  how much to take when to take this   magnesium oxide 400 (240 Mg) MG tablet Commonly known as: MAG-OX Take 400 mg by mouth 2 (two) times daily.   multivitamin Tabs tablet Take 1 tablet by mouth daily.   mycophenolate 180 MG EC tablet Commonly known as: MYFORTIC Take 540 mg by mouth 2 (two) times daily.   omeprazole 40 MG capsule Commonly known as: PRILOSEC Take 1 capsule (40 mg total) by mouth 2 (two) times daily. What changed: when to take this   phosphorus 155-852-130 MG tablet Commonly known as: K PHOS NEUTRAL Take 2 tablets (500 mg total) by mouth 2 (two) times daily for 3  days.   pioglitazone 30 MG tablet Commonly known as: ACTOS Take 30 mg by mouth daily.   predniSONE 5 MG tablet Commonly known as: DELTASONE Take 5 mg by mouth daily with breakfast.   triamcinolone 55 MCG/ACT Aero nasal inhaler Commonly known as: NASACORT Place 2 sprays into the nose daily.   Zinc 50 MG Tabs Take 1 tablet by mouth daily at 6 (six) AM.        Discharge Exam: Filed Weights   02/09/23 2200 02/10/23 0500 02/12/23 0500  Weight: 99.5 kg 101.9 kg 105.8 kg   General: NAD  Condition at discharge: stable  The results of significant diagnostics from this hospitalization (including imaging, microbiology, ancillary and laboratory) are listed below for reference.   Imaging Studies: US Renal Transplant w/Doppler  Result Date: 02/11/2023 CLINICAL DATA:  Acute kidney injury EXAM: ULTRASOUND OF RENAL TRANSPLANT WITH RENAL DOPPLER ULTRASOUND TECHNIQUE: Ultrasound examination of the renal transplant was performed with gray-scale, color and duplex doppler evaluation. COMPARISON:  02/10/2023 and previous FINDINGS: Transplant kidney location: LLQ Transplant Kidney: Renal measurements: 12.7 x 7 x 7.8  cm = volume: . Normal in size and parenchymal echogenicity. No evidence of mass or hydronephrosis. Trace peri-transplant fluid . Color flow in the main renal artery:  Yes Color flow in the main renal vein:  Yes Duplex Doppler Evaluation: Main Renal Artery Velocity: 77 cm/sec Main Renal Artery Resistive Index: 0.67 Venous waveform in main renal vein:  Present Intrarenal resistive index in upper pole:  0.63 (normal 0.6-0.8; equivocal 0.8-0.9; abnormal >= 0.9) Intrarenal resistive index in lower pole: 0.63 (normal 0.6-0.8; equivocal 0.8-0.9; abnormal >= 0.9) Bladder: Normal for degree of bladder distention. Other findings: Complex fluid collection adjacent to transplant 6.2 x 5.3 x 5.4 cm IMPRESSION: 1. Normal appearance of transplant kidney. 2. Complex 6.2 cm fluid collection adjacent to  transplant Electronically Signed   By: Corlis Leak M.D.   On: 02/11/2023 09:04   ECHOCARDIOGRAM COMPLETE  Result Date: 02/10/2023    ECHOCARDIOGRAM REPORT   Patient Name:   Hector Mahoney Date of Exam: 02/10/2023 Medical Rec #:  [id number]          Height:       66.0 in Accession #:    [id number]         Weight:       224.6 lb Date of Birth:  [birth year]          BSA:          2.101 m Patient Age:    52 years           BP:           127/63 mmHg Patient Gender: M                  HR:           95 bpm. Exam Location:  Inpatient Procedure: 2D Echo, Cardiac Doppler and Color Doppler Indications:    Abnormal ECG  History:        Patient has prior history of Echocardiogram examinations, most                 recent 03/01/2021. CHF; Risk Factors:Diabetes, Hypertension and                 Dyslipidemia. S/p renal transplant.  Sonographer:    Milda Smart Referring Phys: [id number] Tomia Enlow A Sharanda Shinault  Sonographer Comments: Image acquisition challenging due to patient body habitus and Image acquisition challenging due to respiratory motion. IMPRESSIONS  1. Left ventricular ejection fraction, by estimation, is 60 to 65%. The left ventricle has normal function. The left ventricle has no regional wall motion abnormalities. There is moderate asymmetric left ventricular hypertrophy of the lateral segment. Left ventricular diastolic parameters are consistent with Grade I diastolic dysfunction (impaired relaxation).  2. Right ventricular systolic function was not well visualized. The right ventricular size is normal. Tricuspid regurgitation signal is inadequate for assessing PA pressure.  3. The mitral valve is normal in structure. No evidence of mitral valve regurgitation. No evidence of mitral stenosis.  4. The aortic valve is tricuspid. Aortic valve regurgitation is not visualized. No aortic stenosis is present. Comparison(s): Changes from prior study are noted. LVEF improved from 45-50% to 60-65% now. FINDINGS  Left Ventricle: Left  ventricular ejection fraction, by estimation, is 60 to 65%. The left ventricle has normal function. The left ventricle has no regional wall motion abnormalities. The left ventricular internal cavity size was normal in size. There is  moderate asymmetric left ventricular hypertrophy of the lateral segment. Left ventricular diastolic parameters are consistent  with Grade I diastolic dysfunction (impaired relaxation). Right Ventricle: The right ventricular size is normal. No increase in right ventricular wall thickness. Right ventricular systolic function was not well visualized. Tricuspid regurgitation signal is inadequate for assessing PA pressure. Left Atrium: Left atrial size was normal in size. Right Atrium: Right atrial size was normal in size. Pericardium: Trivial pericardial effusion is present. Mitral Valve: The mitral valve is normal in structure. There is moderate calcification of the mitral valve leaflet(s). Mild mitral annular calcification. No evidence of mitral valve regurgitation. No evidence of mitral valve stenosis. Tricuspid Valve: The tricuspid valve is grossly normal. Tricuspid valve regurgitation is not demonstrated. No evidence of tricuspid stenosis. Aortic Valve: The aortic valve is tricuspid. Aortic valve regurgitation is not visualized. No aortic stenosis is present. Pulmonic Valve: The pulmonic valve was not well visualized. Pulmonic valve regurgitation is not visualized. No evidence of pulmonic stenosis. Aorta: The aortic root and ascending aorta are structurally normal, with no evidence of dilitation. Venous: The inferior vena cava was not well visualized. IAS/Shunts: The interatrial septum was not well visualized.  LEFT VENTRICLE PLAX 2D LVIDd:         3.60 cm     Diastology LVIDs:         2.40 cm     LV e' medial:    4.24 cm/s LV PW:         1.40 cm     LV E/e' medial:  16.4 LV IVS:        1.10 cm     LV e' lateral:   6.20 cm/s LVOT diam:     2.00 cm     LV E/e' lateral: 11.2 LV SV:          46 LV SV Index:   22 LVOT Area:     3.14 cm  LV Volumes (MOD) LV vol d, MOD A2C: 34.7 ml LV vol d, MOD A4C: 60.5 ml LV vol s, MOD A2C: 13.7 ml LV vol s, MOD A4C: 23.0 ml LV SV MOD A2C:     21.0 ml LV SV MOD A4C:     60.5 ml LV SV MOD BP:      29.1 ml RIGHT VENTRICLE            IVC RV S prime:     8.70 cm/s  IVC diam: 1.50 cm TAPSE (M-mode): 1.2 cm LEFT ATRIUM             Index        RIGHT ATRIUM          Index LA diam:        3.50 cm 1.67 cm/m   RA Area:     9.43 cm LA Vol (A2C):   30.1 ml 14.33 ml/m  RA Volume:   19.90 ml 9.47 ml/m LA Vol (A4C):   29.0 ml 13.80 ml/m LA Biplane Vol: 29.7 ml 14.14 ml/m  AORTIC VALVE LVOT Vmax:   110.00 cm/s LVOT Vmean:  76.000 cm/s LVOT VTI:    0.147 m  AORTA Ao Root diam: 3.00 cm Ao Asc diam:  3.10 cm MITRAL VALVE MV Area (PHT): 2.33 cm     SHUNTS MV Decel Time: 325 msec     Systemic VTI:  0.15 m MV E velocity: 69.50 cm/s   Systemic Diam: 2.00 cm MV A velocity: 108.00 cm/s MV E/A ratio:  0.64 Vishnu Priya Mallipeddi Electronically signed by Winfield Rast Mallipeddi Signature Date/Time: 02/10/2023/4:24:38 PM    Final  US RENAL  Result Date: 02/10/2023 CLINICAL DATA:  [id number] AKI (acute kidney injury) (HCC) [id number] EXAM: RENAL / URINARY TRACT ULTRASOUND COMPLETE COMPARISON:  CT 11/23/2021 FINDINGS: Right Kidney: Renal measurements: 6.9 x 3.3 x 4.6 cm = volume: 54 mL. Echogenicity within normal limits. No mass or hydronephrosis visualized. Left Kidney: Renal measurements: 7.2 x 4.5 x 4.5 cm = volume: 75 mL. Echogenicity within normal limits. No mass or hydronephrosis visualized. Bladder: Appears normal for degree of bladder distention.There is a 1.3 cm hypoechoic region at the posterior margin of the urinary bladder. Other: 11.1 x 2.9 x 4.8 cm simple appearing cystic collection in the right pelvis, not present on prior CT although VP shunt tubing is noted. Technologist describes technically difficult study secondary to bowel gas and patient breathing. IMPRESSION: 1. No  hydronephrosis or other acute finding. 2. 1.3 cm hypoechoic region at the posterior margin of the urinary bladder, possibly a small bladder mass. 3. 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 Electronically Signed   By: Corlis Leak M.D.   On: 02/10/2023 09:56   DG Chest Portable 1 View  Result Date: 02/09/2023 CLINICAL DATA:  SOB EXAM: PORTABLE CHEST - 1 VIEW COMPARISON:  02/14/2021 FINDINGS: Lungs are clear. Heart size and mediastinal contours are within normal limits. No effusion. Visualized bones unremarkable. IMPRESSION: No acute cardiopulmonary disease. Electronically Signed   By: Corlis Leak M.D.   On: 02/09/2023 19:39    Microbiology: Results for orders placed or performed during the hospital encounter of 02/09/23  MRSA Next Gen by PCR, Nasal     Status: None   Collection Time: 02/10/23  2:32 AM   Specimen: Nasal Mucosa; Nasal Swab  Result Value Ref Range Status   MRSA by PCR Next Gen NOT DETECTED NOT DETECTED Final    Comment: (NOTE) The GeneXpert MRSA Assay (FDA approved for NASAL specimens only), is one component of a comprehensive MRSA colonization surveillance program. It is not intended to diagnose MRSA infection nor to guide or monitor treatment for MRSA infections. Test performance is not FDA approved in patients less than 78 years old. Performed at [facility], [address], [place], [place] [address]   Culture, blood (Routine X 2) w Reflex to ID Panel     Status: None (Preliminary result)   Collection Time: 02/10/23  6:35 PM   Specimen: BLOOD  Result Value Ref Range Status   Specimen Description   Final    BLOOD BLOOD RIGHT HAND AEROBIC BOTTLE ONLY Performed at [facility], [address], [place], [place] [address]    Special Requests   Final    Blood Culture results may not be optimal due to an inadequate volume of blood received in culture bottles BOTTLES DRAWN AEROBIC  ONLY Performed at [facility], [address] [place], [place] [address]    Culture   Final    NO GROWTH < 24 HOURS Performed at [facility], [address], [place], [place] [address]    Report Status PENDING  Incomplete  Culture, blood (Routine X 2) w Reflex to ID Panel     Status: None (Preliminary result)   Collection Time: 02/10/23  6:35 PM   Specimen: BLOOD  Result Value Ref Range Status   Specimen Description   Final    BLOOD BLOOD RIGHT HAND AEROBIC BOTTLE ONLY Performed at [facility], [address], [place], [place] [address]  Special Requests   Final    Blood Culture results may not be optimal due to an inadequate volume of blood received in culture bottles BOTTLES DRAWN AEROBIC ONLY Performed at [facility], [address], [place], [place] [address]    Culture   Final    NO GROWTH < 24 HOURS Performed at [facility], [address] [place], [place] [address]    Report Status PENDING  Incomplete    Labs: CBC: Recent Labs  Lab 02/09/23 1907 02/10/23 0227  WBC 6.0 6.8  NEUTROABS 4.0 4.8  HGB 17.0  14.9 14.7  HCT 50.0  45.3 44.9  MCV 79.5* 79.3*  PLT 172 191   Basic Metabolic Panel: Recent Labs  Lab 02/09/23 2217 02/10/23 0153 02/10/23 0155 02/10/23 0826 02/10/23 1151 02/10/23 2156 02/11/23 0454 02/12/23 0546  NA  --  130*   < > 132* 131* 131* 134* 135  K  --  2.8*   < > 3.3* 3.5 3.7 3.4* 3.5  CL  --  93*   < > 97* 97* 99 102 108  CO2  --  25   < > 23 23 24  20* 19*  GLUCOSE  --  324*   < > 176* 167* 380* 231* 247*  BUN  --  56*   < > 53* 49* 48* 38* 26*  CREATININE  --  2.12*   < > 1.92* 1.86* 1.83* 1.64* 1.33*  CALCIUM  --  10.3   < > 10.2 10.0 9.6 9.5 9.4  MG 2.5* 2.4  --   --   --   --   --   --   PHOS  --  2.4*  --   --   --   --   --  2.1*   < > = values in this interval not displayed.   Liver Function Tests: Recent Labs  Lab 02/09/23 1907 02/10/23 0153  02/12/23 0546  AST 20 22  --   ALT 37 39  --   ALKPHOS 154* 139*  --   BILITOT 1.2 0.8  --   PROT 8.0 7.7  --   ALBUMIN 4.5 4.1 3.6   CBG: Recent Labs  Lab 02/11/23 1213 02/11/23 1639 02/11/23 2208 02/12/23 0806 02/12/23 1146  GLUCAP 241* 225* 239* 281* 206*    Discharge time spent: greater than 30 minutes.  Signed: Alba Cory, MD [facility] 02/12/2023

## 2023-02-12 NOTE — Inpatient Diabetes Management (Signed)
Inpatient Diabetes Program Recommendations  AACE/ADA: New Consensus Statement on Inpatient Glycemic Control (2015)  Target Ranges:  Prepandial:   less than 140 mg/dL      Peak postprandial:   less than 180 mg/dL (1-2 hours)      Critically ill patients:  140 - 180 mg/dL   Lab Results  Component Value Date   GLUCAP 281 (H) 02/12/2023   HGBA1C 13.2 (H) 02/10/2023    Review of Glycemic Control  Diabetes history: DM2 Outpatient Diabetes medications: Lantus 50 at bedtime, Novolog 30 TID, Actos 30 every day, Farxiga 10 every day, Prednisone 5 mg QD Current orders for Inpatient glycemic control: Levemir 50 every day, Novolog 0-15 TID with meals and 0-5 HS + 18 units TID  HgbA1C - 13.2%  Inpatient Diabetes Program Recommendations:    Spoke with pt regarding his diabetes control. Pt states he has not been taking the meal coverage Novolog, although he takes pen with him to work. Doesn't know why he self-sabatoges. States Dexcom G7 has been "acting up" lately. Was trying to get results sent to [facility] office. Instructed pt to call 1-800 number and they will assist with any issues with the Dexcom. Discussed importance of reducing HgbA1C to 7%, to decrease risks of both long and short term complications. Pt states he knows what to do, he's just been very lazy about taking care of himself. Hopefully pt will follow thorough with recommendations. Discussed with Dr Sunnie Nielsen.  For discharge today. Will reduce Novolog to 20 units TID, down from home meds of 30 TID.  Thank you. Ailene Ards, RD, LDN, CDCES Inpatient Diabetes Coordinator [phone]

## 2023-02-12 NOTE — Progress Notes (Signed)
Patient provided with discharge education, patient verbalized understanding.  

## 2023-02-13 LAB — CULTURE, BLOOD (ROUTINE X 2): Culture: NO GROWTH

## 2023-02-15 LAB — CULTURE, BLOOD (ROUTINE X 2)

## 2023-02-16 DIAGNOSIS — R0981 Nasal congestion: Secondary | ICD-10-CM | POA: Diagnosis not present

## 2023-02-16 DIAGNOSIS — J069 Acute upper respiratory infection, unspecified: Secondary | ICD-10-CM | POA: Diagnosis not present

## 2023-02-16 DIAGNOSIS — R051 Acute cough: Secondary | ICD-10-CM | POA: Diagnosis not present

## 2023-02-18 ENCOUNTER — Encounter: Payer: Self-pay | Admitting: Internal Medicine

## 2023-02-18 ENCOUNTER — Ambulatory Visit (INDEPENDENT_AMBULATORY_CARE_PROVIDER_SITE_OTHER): Payer: Federal, State, Local not specified - PPO | Admitting: Internal Medicine

## 2023-02-18 VITALS — BP 122/78 | HR 91 | Ht 66.0 in | Wt 234.0 lb

## 2023-02-18 DIAGNOSIS — E113593 Type 2 diabetes mellitus with proliferative diabetic retinopathy without macular edema, bilateral: Secondary | ICD-10-CM

## 2023-02-18 DIAGNOSIS — E1142 Type 2 diabetes mellitus with diabetic polyneuropathy: Secondary | ICD-10-CM | POA: Diagnosis not present

## 2023-02-18 DIAGNOSIS — E1165 Type 2 diabetes mellitus with hyperglycemia: Secondary | ICD-10-CM

## 2023-02-18 DIAGNOSIS — Z794 Long term (current) use of insulin: Secondary | ICD-10-CM

## 2023-02-18 MED ORDER — INSULIN GLARGINE 100 UNIT/ML SOLOSTAR PEN: 60.0000 [IU] | PEN_INJECTOR | Freq: Every day | SUBCUTANEOUS | 3 refills | Status: DC

## 2023-02-18 MED ORDER — INSULIN ASPART 100 UNIT/ML FLEXPEN: 25.0000 [IU] | PEN_INJECTOR | Freq: Three times a day (TID) | SUBCUTANEOUS | 3 refills | Status: AC

## 2023-02-18 NOTE — Patient Instructions (Addendum)
Continue Farxiga 10 mg daily  Continue Pioglitazone 30 mg , 1 tablet daily  Increase  Lantus 60 units daily  Novolog 30 units with each meal  Novolog correctional insulin: ADD extra units on insulin to your meal-time Novolog dose if your blood sugars are higher than 150. Use the scale below to help guide you before each meal and bedtime   Blood sugar before meal Number of units to inject  Less than 150 0 unit  151 -  170 1 units  171 -  190 2 units  191 -  210 3 units  211 -  230 4 units  231 -  250 5 units  251 -  270 6 units  271 -  290 7 units  291 -  310 8 units  311 - 330 9 units      HOW TO TREAT LOW BLOOD SUGARS (Blood sugar LESS THAN 70 MG/DL) Please follow the RULE OF 15 for the treatment of hypoglycemia treatment (when your (blood sugars are less than 70 mg/dL)   STEP 1: Take 15 grams of carbohydrates when your blood sugar is low, which includes:  3-4 GLUCOSE TABS  OR 3-4 OZ OF JUICE OR REGULAR SODA OR ONE TUBE OF GLUCOSE GEL    STEP 2: RECHECK blood sugar in 15 MINUTES STEP 3: If your blood sugar is still low at the 15 minute recheck --> then, go back to STEP 1 and treat AGAIN with another 15 grams of carbohydrates.

## 2023-02-18 NOTE — Progress Notes (Signed)
Name: Hector Mahoney  MRN/ DOB: [id number], [birth year]   Age/ Sex: 52 y.o., male    PCP: Corwin Levins, MD   Reason for Endocrinology Evaluation: Type 2 Diabetes Mellitus     Date of Initial Endocrinology Visit: 07/25/2021    Hector Mahoney IDENTIFIER: Hector Mahoney is a 52 y.o. 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 The Hector Mahoney presented for initial endocrinology clinic visit on 07/25/2021 for consultative assistance with his diabetes management.    HPI: Hector Mahoney was    Diagnosed with DM in 1998  Prior Medications tried/Intolerance: Has been on insulin since his diagnosis.  Hemoglobin A1c has ranged from 7.3% in 2019, peaking at 11.4% in 2014.   Ozempic caused nausea and vomiting  [facility]    Started on Pioglitazone by transplant team 08/2022  Start Farxiga through transplant team 12/2022  SUBJECTIVE:   During the last visit (01/25/2023): A1c 11.2%    Today (02/18/23): Hector Mahoney is here for a follow up on diabetes management.  He checks his  blood sugars multiple times a day through dexcom. The Hector Mahoney  had hypoglycemic episodes since the last clinic visit  He is S/p kidney, pancreatic transplant on 12/28/2021 Hector Mahoney is on tacrolimus and prednisone.  He continues to follow-up with the transplant team at [facility]  Hector Mahoney presented to the ED with severe hyperglycemia with a serum glucose >700 mg/dL and AKI  Denies nausea, vomiting Denies constipation or diarrhea    HOME ENDOCRINE  REGIMEN: Farxiga 10 mg daily through transplant team Pioglitazone 30 mg daily Lantus 52 units daily- takes 50 units  Novolog 30 units TIDQAC- takes 25 units  [id number] (BG-130/20)     Statin: yes ACE-I/ARB: yes  CONTINUOUS GLUCOSE MONITORING RECORD INTERPRETATION    Dates of Recording: 6/25-02/18/2023  Sensor description: Dexcom  Results statistics:   CGM use %  of time 43  Average and SD 266/66  Time in range    12    %  % Time Above 180 25  % Time above 250 63  % Time Below target 0   Glycemic patterns summary: BG's are high during the day and night  Hyperglycemic episodes postprandial  Hypoglycemic episodes occurred N/A  Overnight periods: High   DIABETIC COMPLICATIONS: Microvascular complications:  DR ( on monthly injections ) Last eye exam: Completed 06/2021  Macrovascular complications:  CHF Denies: CAD, PVD, CVA   PAST HISTORY: Past Medical History:  Past Medical History:  Diagnosis Date   Anemia    CHF (congestive heart failure) (HCC)    Esophageal reflux 04/27/2009   ESRD (end stage renal disease) on dialysis [facility])    prior to renal transplant   HYPERLIPIDEMIA 04/24/2007   HYPERTENSION 04/24/2007   Insulin dependent type 1 diabetes mellitus (HCC)    Past Surgical History:  Past Surgical History:  Procedure Laterality Date   AV FISTULA PLACEMENT Left 06/04/2019   Procedure: BRACHIAL-CEPHALIC ARTERIOVENOUS (AV) FISTULA CREATION LEFT ARM;  Surgeon: Larina Earthly, MD;  [facility]: [facility] OR;  Service: Vascular;  Laterality: Left;   CAPD INSERTION N/A 08/24/2021   Procedure: LAPAROSCOPIC INSERTION CONTINUOUS AMBULATORY PERITONEAL DIALYSIS  (CAPD) CATHETER;  Surgeon: Leonie Douglas, MD;  Location: [facility] OR;  Service: Vascular;  Laterality: N/A;   COLONOSCOPY     polyp   IR FLUORO GUIDE CV LINE RIGHT  10/22/2017   IR THROMBECTOMY AV FISTULA W/THROMBOLYSIS/PTA/STENT INC/SHUNT/IMG LT  Left 03/2021   IR US GUIDE VASC ACCESS RIGHT  10/22/2017   MOUTH SURGERY     tooth ext    Social History:  reports that he quit smoking about 7 years ago. His smoking use included cigarettes. He has never used smokeless tobacco. He reports that he does not currently use alcohol. He reports that he does not use drugs. Family History:  Family History  Problem Relation Age of Onset   Hypertension Mother    Diabetes Mother    Diabetes Maternal  Aunt    Lung cancer Maternal Grandfather    Colon polyps Maternal Uncle    Colon cancer Maternal Uncle    Heart disease Maternal Uncle    Kidney disease Neg Hx    Esophageal cancer Neg Hx    Rectal cancer Neg Hx    Stomach cancer Neg Hx      HOME MEDICATIONS: Allergies as of 02/18/2023   No Active Allergies      Medication List        Accurate as of February 18, 2023  1:31 PM. If you have any questions, ask your nurse or doctor.          amLODipine 10 MG tablet Commonly known as: NORVASC Take 1 tablet by mouth daily.   atorvastatin 80 MG tablet Commonly known as: LIPITOR Take 80 mg by mouth at bedtime.   carvedilol 12.5 MG tablet Commonly known as: COREG Take 1 tablet by mouth 2 (two) times daily with a meal. What changed: Another medication with the same name was removed. Continue taking this medication, and follow the directions you see here. Changed by: Scarlette Shorts, MD   dapagliflozin propanediol 10 MG Tabs tablet Commonly known as: FARXIGA Take 10 mg by mouth daily.   Envarsus XR 4 MG Tb24 Generic drug: tacrolimus ER Take 4 mg by mouth daily before breakfast.   Envarsus XR 1 MG Tb24 Generic drug: tacrolimus ER Take 3 mg by mouth daily before breakfast.   Flomax 0.4 MG Caps capsule Generic drug: tamsulosin Take 0.4 mg by mouth at bedtime.   furosemide 80 MG tablet Commonly known as: LASIX Take 0.5 tablets (40 mg total) by mouth daily.   insulin aspart 100 UNIT/ML FlexPen Commonly known as: NOVOLOG Inject 25 Units into the skin 3 (three) times daily with meals.   insulin glargine 100 UNIT/ML Solostar Pen Commonly known as: LANTUS Inject 50 Units into the skin daily.   magnesium oxide 400 (240 Mg) MG tablet Commonly known as: MAG-OX Take 400 mg by mouth 2 (two) times daily.   multivitamin Tabs tablet Take 1 tablet by mouth daily.   mycophenolate 180 MG EC tablet Commonly known as: MYFORTIC Take 540 mg by mouth 2 (two) times daily.    omeprazole 40 MG capsule Commonly known as: PRILOSEC Take 1 capsule (40 mg total) by mouth 2 (two) times daily. What changed: when to take this   pioglitazone 30 MG tablet Commonly known as: ACTOS Take 30 mg by mouth daily.   predniSONE 5 MG tablet Commonly known as: DELTASONE Take 5 mg by mouth daily with breakfast.   triamcinolone 55 MCG/ACT Aero nasal inhaler Commonly known as: NASACORT Place 2 sprays into the nose daily.   Zinc 50 MG Tabs Take 1 tablet by mouth daily at 6 (six) AM.         ALLERGIES: No Active Allergies       OBJECTIVE:   VITAL SIGNS: BP 122/78 (BP Location: Left Arm,  Hector Mahoney Position: Sitting, Cuff Size: Large)   Pulse 91   Ht 5\' 6"  (1.676 m)   Wt 234 lb (106.1 kg)   SpO2 97%   BMI 37.77 kg/m    PHYSICAL EXAM:  General: Hector Mahoney appears well and is in NAD  Lungs: Clear with good BS bilat   Heart: RRR   Neuro: MS is good with appropriate affect, Hector Mahoney is alert and Ox3    DM foot exam:09/24/2022  The skin of the feet is without sores or ulcerations.Callous formation at the 1st MT head B/L  The pedal pulses are undetectable  The sensation is decreased  to a screening 5.07, 10 gram monofilament on the left     DATA REVIEWED:  Lab Results  Component Value Date   HGBA1C 13.2 (H) 02/10/2023   HGBA1C 8.8 (A) 09/24/2022   HGBA1C 6.0 (A) 05/10/2022     Latest Reference Range & Units 02/12/23 05:46  Sodium 135 - 145 mmol/L 135  Potassium 3.5 - 5.1 mmol/L 3.5  Chloride 98 - 111 mmol/L 108  CO2 22 - 32 mmol/L 19 (L)  Glucose 70 - 99 mg/dL 045 (H)  BUN 6 - 20 mg/dL 26 (H)  Creatinine 4.09 - 1.24 mg/dL 8.11 (H)  Calcium 8.9 - 10.3 mg/dL 9.4  Anion gap 5 - 15  8  Phosphorus 2.5 - 4.6 mg/dL 2.1 (L)  Albumin 3.5 - 5.0 g/dL 3.6  GFR, Estimated >91 mL/min >60  (L): Data is abnormally low (H): Data is abnormally high  12/31/2022 A1c 11.2% BUN 21 Cr 1.1 GFR 82 Ca 10.7    Old records , labs and images have been reviewed.   ASSESSMENT  / PLAN / RECOMMENDATIONS:   1) Type 2 Diabetes Mellitus,Poorly controlled, With neuropathic, retinopathic complications , S/P kidney /pancreas transplant  - Most recent A1c of 13.2 %. Goal A1c < 7.0 %.    -Hector Mahoney is s/p pancreatic and renal transplant in May 2023 -Unfortunately, he continues with hyperglycemia which is worsening in nature, looking at his CGM download, the Hector Mahoney has been noted with persistent hyperglycemia, with no indication of consistent insulin intake, but the Hector Mahoney does assure me that he does take his insulin on a regular basis -He is currently on prednisone and tacrolimus  -Intolerant to Ozempic -He also obtains Insulin through [place]  -Hector Mahoney was started on pioglitazone and Farxiga through his transplant team. -He is on different doses than previously prescribed, Hector Mahoney states that his doses where decreased during hospitalization, I did explain to them Hector Mahoney the importance of staying consistent with medications and following instructions -He was provided with correction scale again to be used before each meal as well as bedtime -Hector Mahoney declines insulin pump technology, as he is not comfortable with having things attached to him, I did briefly show him the OmniPod as it may be a good option -I will increase his basal insulin as well as prandial insulin as below     MEDICATIONS:  Continue Farxiga 10 mg daily transplant team Continue pioglitazone 30 mg daily  Increase Lantus 60 units daily  Increase Novolog 30 units with each meal  Continue correction scale : Novolog (BG -130/20) TIDQAC  and QHS  EDUCATION / INSTRUCTIONS: BG monitoring instructions: Hector Mahoney is instructed to check his blood sugars 3 times a day, before meals . Call [place] [facility] if: BG persistently < 70  I reviewed the Rule of 15 for the treatment of hypoglycemia in detail with the Hector Mahoney. Literature supplied.   2)  Diabetic complications:  Eye: Does  have known diabetic  retinopathy.  Neuro/ Feet: Does  have known diabetic peripheral neuropathy. Renal: Hector Mahoney does  have known baseline CKD, S/P transplant . He is  on an ACEI/ARB at present.     F/U in 2 months   I spent 30 minutes preparing to see the Hector Mahoney by review of recent labs, imaging and procedures, obtaining and reviewing separately obtained history, communicating with the Hector Mahoney/family or caregiver, ordering medications, tests or procedures, and documenting clinical information in the EHR including the differential Dx, treatment, and any further evaluation and other management        Signed electronically by: Lyndle Herrlich, MD  [facility] Endocrinology  [facility] [address] [place] [place], [place] [address] Phone: [phone] FAX: [phone]   CC: Corwin Levins, MD [address] [place] [address] Phone: [phone]  Fax: [phone]    Return to Endocrinology clinic as below: Future Appointments  Date Time Provider Department Center  05/06/2023 11:15 AM Bonnita Levan, RD [facility] NDM  07/30/2023  1:40 PM Ameah Chanda, Konrad Dolores, MD [facility] None

## 2023-02-22 DIAGNOSIS — R9349 Abnormal radiologic findings on diagnostic imaging of other urinary organs: Secondary | ICD-10-CM | POA: Diagnosis not present

## 2023-03-01 DIAGNOSIS — N289 Disorder of kidney and ureter, unspecified: Secondary | ICD-10-CM | POA: Diagnosis not present

## 2023-03-01 DIAGNOSIS — Z136 Encounter for screening for cardiovascular disorders: Secondary | ICD-10-CM | POA: Diagnosis not present

## 2023-03-01 DIAGNOSIS — I10 Essential (primary) hypertension: Secondary | ICD-10-CM | POA: Diagnosis not present

## 2023-03-01 DIAGNOSIS — Z0000 Encounter for general adult medical examination without abnormal findings: Secondary | ICD-10-CM | POA: Diagnosis not present

## 2023-03-07 ENCOUNTER — Ambulatory Visit: Payer: Federal, State, Local not specified - PPO | Admitting: Urology

## 2023-03-18 DIAGNOSIS — E1169 Type 2 diabetes mellitus with other specified complication: Secondary | ICD-10-CM | POA: Diagnosis not present

## 2023-03-18 DIAGNOSIS — Z940 Kidney transplant status: Secondary | ICD-10-CM | POA: Diagnosis not present

## 2023-03-18 DIAGNOSIS — Z9483 Pancreas transplant status: Secondary | ICD-10-CM | POA: Diagnosis not present

## 2023-03-28 ENCOUNTER — Ambulatory Visit: Payer: Federal, State, Local not specified - PPO | Admitting: Internal Medicine

## 2023-04-02 DIAGNOSIS — Z79899 Other long term (current) drug therapy: Secondary | ICD-10-CM | POA: Diagnosis not present

## 2023-04-02 DIAGNOSIS — E1110 Type 2 diabetes mellitus with ketoacidosis without coma: Secondary | ICD-10-CM | POA: Diagnosis not present

## 2023-04-02 DIAGNOSIS — Z940 Kidney transplant status: Secondary | ICD-10-CM | POA: Diagnosis not present

## 2023-04-02 DIAGNOSIS — D72829 Elevated white blood cell count, unspecified: Secondary | ICD-10-CM | POA: Diagnosis not present

## 2023-04-02 DIAGNOSIS — Z9483 Pancreas transplant status: Secondary | ICD-10-CM | POA: Diagnosis not present

## 2023-04-02 DIAGNOSIS — D849 Immunodeficiency, unspecified: Secondary | ICD-10-CM | POA: Diagnosis not present

## 2023-04-03 DIAGNOSIS — R0602 Shortness of breath: Secondary | ICD-10-CM | POA: Diagnosis not present

## 2023-04-03 DIAGNOSIS — N186 End stage renal disease: Secondary | ICD-10-CM | POA: Diagnosis not present

## 2023-04-03 DIAGNOSIS — Z940 Kidney transplant status: Secondary | ICD-10-CM | POA: Diagnosis not present

## 2023-04-03 DIAGNOSIS — T8612 Kidney transplant failure: Secondary | ICD-10-CM | POA: Diagnosis not present

## 2023-04-03 DIAGNOSIS — E1122 Type 2 diabetes mellitus with diabetic chronic kidney disease: Secondary | ICD-10-CM | POA: Diagnosis not present

## 2023-04-03 DIAGNOSIS — Z79899 Other long term (current) drug therapy: Secondary | ICD-10-CM | POA: Diagnosis not present

## 2023-04-03 DIAGNOSIS — I120 Hypertensive chronic kidney disease with stage 5 chronic kidney disease or end stage renal disease: Secondary | ICD-10-CM | POA: Diagnosis not present

## 2023-04-03 DIAGNOSIS — K8689 Other specified diseases of pancreas: Secondary | ICD-10-CM | POA: Diagnosis not present

## 2023-04-03 DIAGNOSIS — D72829 Elevated white blood cell count, unspecified: Secondary | ICD-10-CM | POA: Diagnosis not present

## 2023-04-03 DIAGNOSIS — A419 Sepsis, unspecified organism: Secondary | ICD-10-CM | POA: Diagnosis not present

## 2023-04-03 DIAGNOSIS — N179 Acute kidney failure, unspecified: Secondary | ICD-10-CM | POA: Diagnosis not present

## 2023-04-03 DIAGNOSIS — I2510 Atherosclerotic heart disease of native coronary artery without angina pectoris: Secondary | ICD-10-CM | POA: Diagnosis not present

## 2023-04-03 DIAGNOSIS — Z9483 Pancreas transplant status: Secondary | ICD-10-CM | POA: Diagnosis not present

## 2023-04-04 DIAGNOSIS — Z940 Kidney transplant status: Secondary | ICD-10-CM | POA: Diagnosis not present

## 2023-04-04 DIAGNOSIS — I120 Hypertensive chronic kidney disease with stage 5 chronic kidney disease or end stage renal disease: Secondary | ICD-10-CM | POA: Diagnosis not present

## 2023-04-04 DIAGNOSIS — Z9483 Pancreas transplant status: Secondary | ICD-10-CM | POA: Diagnosis not present

## 2023-04-04 DIAGNOSIS — Z79899 Other long term (current) drug therapy: Secondary | ICD-10-CM | POA: Diagnosis not present

## 2023-04-04 DIAGNOSIS — N186 End stage renal disease: Secondary | ICD-10-CM | POA: Diagnosis not present

## 2023-04-04 DIAGNOSIS — E1122 Type 2 diabetes mellitus with diabetic chronic kidney disease: Secondary | ICD-10-CM | POA: Diagnosis not present

## 2023-04-04 DIAGNOSIS — N179 Acute kidney failure, unspecified: Secondary | ICD-10-CM | POA: Diagnosis not present

## 2023-04-05 DIAGNOSIS — N186 End stage renal disease: Secondary | ICD-10-CM | POA: Diagnosis not present

## 2023-04-05 DIAGNOSIS — N179 Acute kidney failure, unspecified: Secondary | ICD-10-CM | POA: Diagnosis not present

## 2023-04-05 DIAGNOSIS — Z79899 Other long term (current) drug therapy: Secondary | ICD-10-CM | POA: Diagnosis not present

## 2023-04-05 DIAGNOSIS — Z4682 Encounter for fitting and adjustment of non-vascular catheter: Secondary | ICD-10-CM | POA: Diagnosis not present

## 2023-04-05 DIAGNOSIS — I120 Hypertensive chronic kidney disease with stage 5 chronic kidney disease or end stage renal disease: Secondary | ICD-10-CM | POA: Diagnosis not present

## 2023-04-05 DIAGNOSIS — T8613 Kidney transplant infection: Secondary | ICD-10-CM | POA: Diagnosis not present

## 2023-04-05 DIAGNOSIS — Z9483 Pancreas transplant status: Secondary | ICD-10-CM | POA: Diagnosis not present

## 2023-04-05 DIAGNOSIS — Z940 Kidney transplant status: Secondary | ICD-10-CM | POA: Diagnosis not present

## 2023-04-06 DIAGNOSIS — T8613 Kidney transplant infection: Secondary | ICD-10-CM | POA: Diagnosis not present

## 2023-04-06 DIAGNOSIS — Z940 Kidney transplant status: Secondary | ICD-10-CM | POA: Diagnosis not present

## 2023-04-06 DIAGNOSIS — Z79899 Other long term (current) drug therapy: Secondary | ICD-10-CM | POA: Diagnosis not present

## 2023-04-06 DIAGNOSIS — N179 Acute kidney failure, unspecified: Secondary | ICD-10-CM | POA: Diagnosis not present

## 2023-04-06 DIAGNOSIS — Z9483 Pancreas transplant status: Secondary | ICD-10-CM | POA: Diagnosis not present

## 2023-04-06 DIAGNOSIS — D849 Immunodeficiency, unspecified: Secondary | ICD-10-CM | POA: Diagnosis not present

## 2023-04-07 DIAGNOSIS — N179 Acute kidney failure, unspecified: Secondary | ICD-10-CM | POA: Diagnosis not present

## 2023-04-07 DIAGNOSIS — D849 Immunodeficiency, unspecified: Secondary | ICD-10-CM | POA: Diagnosis not present

## 2023-04-07 DIAGNOSIS — Z79899 Other long term (current) drug therapy: Secondary | ICD-10-CM | POA: Diagnosis not present

## 2023-04-07 DIAGNOSIS — Z940 Kidney transplant status: Secondary | ICD-10-CM | POA: Diagnosis not present

## 2023-04-07 DIAGNOSIS — Z9483 Pancreas transplant status: Secondary | ICD-10-CM | POA: Diagnosis not present

## 2023-04-07 DIAGNOSIS — T8613 Kidney transplant infection: Secondary | ICD-10-CM | POA: Diagnosis not present

## 2023-04-08 DIAGNOSIS — N186 End stage renal disease: Secondary | ICD-10-CM | POA: Diagnosis not present

## 2023-04-08 DIAGNOSIS — I120 Hypertensive chronic kidney disease with stage 5 chronic kidney disease or end stage renal disease: Secondary | ICD-10-CM | POA: Diagnosis not present

## 2023-04-08 DIAGNOSIS — Z940 Kidney transplant status: Secondary | ICD-10-CM | POA: Diagnosis not present

## 2023-04-08 DIAGNOSIS — E1122 Type 2 diabetes mellitus with diabetic chronic kidney disease: Secondary | ICD-10-CM | POA: Diagnosis not present

## 2023-04-08 DIAGNOSIS — N179 Acute kidney failure, unspecified: Secondary | ICD-10-CM | POA: Diagnosis not present

## 2023-04-16 ENCOUNTER — Ambulatory Visit (INDEPENDENT_AMBULATORY_CARE_PROVIDER_SITE_OTHER): Payer: Federal, State, Local not specified - PPO | Admitting: Internal Medicine

## 2023-04-16 ENCOUNTER — Encounter: Payer: Self-pay | Admitting: Internal Medicine

## 2023-04-16 VITALS — BP 124/86 | HR 106 | Ht 66.0 in | Wt 225.0 lb

## 2023-04-16 DIAGNOSIS — E1142 Type 2 diabetes mellitus with diabetic polyneuropathy: Secondary | ICD-10-CM | POA: Diagnosis not present

## 2023-04-16 DIAGNOSIS — Z940 Kidney transplant status: Secondary | ICD-10-CM

## 2023-04-16 DIAGNOSIS — E1165 Type 2 diabetes mellitus with hyperglycemia: Secondary | ICD-10-CM

## 2023-04-16 DIAGNOSIS — Z7984 Long term (current) use of oral hypoglycemic drugs: Secondary | ICD-10-CM

## 2023-04-16 DIAGNOSIS — Z9483 Pancreas transplant status: Secondary | ICD-10-CM

## 2023-04-16 DIAGNOSIS — Z794 Long term (current) use of insulin: Secondary | ICD-10-CM

## 2023-04-16 LAB — POCT GLYCOSYLATED HEMOGLOBIN (HGB A1C): Hemoglobin A1C: 10.7 % — AB (ref 4.0–5.6)

## 2023-04-16 LAB — POCT GLUCOSE (DEVICE FOR HOME USE): Glucose Fasting, POC: 284 mg/dL — AB (ref 70–99)

## 2023-04-16 MED ORDER — INSULIN GLARGINE 100 UNIT/ML SOLOSTAR PEN: 66.0000 [IU] | PEN_INJECTOR | Freq: Every day | SUBCUTANEOUS | 3 refills | Status: AC

## 2023-04-16 MED ORDER — OMNIPOD 5 G7 PODS (GEN 5) MISC: 1.0000 | 3 refills | Status: DC

## 2023-04-16 MED ORDER — OMNIPOD 5 G7 INTRO (GEN 5) KIT: 1.0000 | PACK | 0 refills | Status: DC

## 2023-04-16 NOTE — Patient Instructions (Addendum)
Continue Farxiga 10 mg daily  Continue Pioglitazone 30 mg , 1 tablet daily  Increase  Lantus 66 units daily  Novolog 30 units with each meal  Novolog correctional insulin: ADD extra units on insulin to your meal-time Novolog dose if your blood sugars are higher than 150. Use the scale below to help guide you before each meal and bedtime   Blood sugar before meal Number of units to inject  Less than 150 0 unit  151 -  170 1 units  171 -  190 2 units  191 -  210 3 units  211 -  230 4 units  231 -  250 5 units  251 -  270 6 units  271 -  290 7 units  291 -  310 8 units  311 - 330 9 units      HOW TO TREAT LOW BLOOD SUGARS (Blood sugar LESS THAN 70 MG/DL) Please follow the RULE OF 15 for the treatment of hypoglycemia treatment (when your (blood sugars are less than 70 mg/dL)   STEP 1: Take 15 grams of carbohydrates when your blood sugar is low, which includes:  3-4 GLUCOSE TABS  OR 3-4 OZ OF JUICE OR REGULAR SODA OR ONE TUBE OF GLUCOSE GEL    STEP 2: RECHECK blood sugar in 15 MINUTES STEP 3: If your blood sugar is still low at the 15 minute recheck --> then, go back to STEP 1 and treat AGAIN with another 15 grams of carbohydrates.

## 2023-04-16 NOTE — Progress Notes (Signed)
Name: Hector Mahoney  MRN/ DOB: [id number], 12-09-1970   Age/ Sex: 52 y.o., male    PCP: Corwin Levins, MD   Reason for Endocrinology Evaluation: Type 2 Diabetes Mellitus     Date of Initial Endocrinology Visit: 07/25/2021    PATIENT IDENTIFIER: [name] is a 52 y.o. 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 The patient presented for initial endocrinology clinic visit on 07/25/2021 for consultative assistance with his diabetes management.    HPI: Hector Mahoney was    Diagnosed with DM in 1998  Prior Medications tried/Intolerance: Has been on insulin since his diagnosis.  Hemoglobin A1c has ranged from 7.3% in 2019, peaking at 11.4% in 2014.   Ozempic caused nausea and vomiting  [facility]    Started on Pioglitazone by transplant team 08/2022  Start Farxiga through transplant team 12/2022  SUBJECTIVE:   During the last visit (01/25/2023): A1c 11.2%    Today (04/16/23): Hector Mahoney is here for a follow up on diabetes management.  He checks his  blood sugars multiple times a day through dexcom. The patient  had hypoglycemic episodes since the last clinic visit  He is S/p kidney, pancreatic transplant on 12/28/2021 patient is on tacrolimus and prednisone.  He continues to follow-up with the transplant team at [facility]  Patient presented to the ED with AKI and DXA 04/02/2023 He is also S/P perinephric abscess , S/P drainage and oxacillin   Had a fall while raining in the rain , with injury to the right elbow , had a right lower abdominal drain , attached to a bulb.  Patient treated with  antibiotics  Denies nausea, vomiting Denies constipation or diarrhea  Denies fever and abdominal pain    HOME ENDOCRINE  REGIMEN: Farxiga 10 mg daily through transplant team Pioglitazone 30 mg daily Lantus 60 units daily Novolog 30 units TIDQAC [id number]  (BG-130/20)     Statin: yes ACE-I/ARB: yes  CONTINUOUS GLUCOSE MONITORING RECORD INTERPRETATION    Dates of Recording: 8/2-8/15/2024  Sensor description: Dexcom  Results statistics:   CGM use % of time 93  Average and SD 242/69  Time in range  17 %  % Time Above 180 40  % Time above 250 43  % Time Below target 0   Glycemic patterns summary: BG's are high during the day and night  Hyperglycemic episodes postprandial  Hypoglycemic episodes occurred N/A  Overnight periods: High   DIABETIC COMPLICATIONS: Microvascular complications:  DR ( on monthly injections ) Last eye exam: Completed 06/2021  Macrovascular complications:  CHF Denies: CAD, PVD, CVA   PAST HISTORY: Past Medical History:  Past Medical History:  Diagnosis Date   Anemia    CHF (congestive heart failure) (HCC)    Esophageal reflux 04/27/2009   ESRD (end stage renal disease) on dialysis [facility])    prior to renal transplant   HYPERLIPIDEMIA 04/24/2007   HYPERTENSION 04/24/2007   Insulin dependent type 1 diabetes mellitus (HCC)    Past Surgical History:  Past Surgical History:  Procedure Laterality Date   AV FISTULA PLACEMENT Left 06/04/2019   Procedure: BRACHIAL-CEPHALIC ARTERIOVENOUS (AV) FISTULA CREATION LEFT ARM;  Surgeon: Larina Earthly, MD;  [facility]: [facility] OR;  Service: Vascular;  Laterality: Left;   CAPD INSERTION N/A 08/24/2021   Procedure: LAPAROSCOPIC INSERTION CONTINUOUS AMBULATORY PERITONEAL DIALYSIS  (CAPD) CATHETER;  Surgeon: Leonie Douglas, MD;  Location: [facility] OR;  Service: Vascular;  Laterality: N/A;   COLONOSCOPY     polyp   IR FLUORO GUIDE CV LINE RIGHT  10/22/2017   IR THROMBECTOMY AV FISTULA W/THROMBOLYSIS/PTA/STENT INC/SHUNT/IMG LT Left 03/2021   IR US GUIDE VASC ACCESS RIGHT  10/22/2017   MOUTH SURGERY     tooth ext    Social History:  reports that he quit smoking about 7 years ago. His smoking use included cigarettes. He has never used smokeless tobacco. He reports that he  does not currently use alcohol. He reports that he does not use drugs. Family History:  Family History  Problem Relation Age of Onset   Hypertension Mother    Diabetes Mother    Diabetes Maternal Aunt    Lung cancer Maternal Grandfather    Colon polyps Maternal Uncle    Colon cancer Maternal Uncle    Heart disease Maternal Uncle    Kidney disease Neg Hx    Esophageal cancer Neg Hx    Rectal cancer Neg Hx    Stomach cancer Neg Hx      HOME MEDICATIONS: Allergies as of 04/16/2023   No Active Allergies      Medication List        Accurate as of April 16, 2023  9:12 AM. If you have any questions, ask your nurse or doctor.          amLODipine 10 MG tablet Commonly known as: NORVASC Take 1 tablet by mouth daily.   atorvastatin 80 MG tablet Commonly known as: LIPITOR Take 80 mg by mouth at bedtime.   carvedilol 12.5 MG tablet Commonly known as: COREG Take 1 tablet by mouth 2 (two) times daily with a meal.   dapagliflozin propanediol 10 MG Tabs tablet Commonly known as: FARXIGA Take 10 mg by mouth daily.   Envarsus XR 4 MG Tb24 Generic drug: tacrolimus ER Take 4 mg by mouth daily before breakfast.   Envarsus XR 1 MG Tb24 Generic drug: tacrolimus ER Take 3 mg by mouth daily before breakfast.   Flomax 0.4 MG Caps capsule Generic drug: tamsulosin Take 0.4 mg by mouth at bedtime.   furosemide 80 MG tablet Commonly known as: LASIX Take 0.5 tablets (40 mg total) by mouth daily.   insulin aspart 100 UNIT/ML FlexPen Commonly known as: NOVOLOG Inject 25 Units into the skin 3 (three) times daily with meals. Max daily 100 units   insulin glargine 100 UNIT/ML Solostar Pen Commonly known as: LANTUS Inject 60 Units into the skin daily.   magnesium oxide 400 (240 Mg) MG tablet Commonly known as: MAG-OX Take 400 mg by mouth 2 (two) times daily.   multivitamin Tabs tablet Take 1 tablet by mouth daily.   mycophenolate 180 MG EC tablet Commonly known as:  MYFORTIC Take 540 mg by mouth 2 (two) times daily.   omeprazole 40 MG capsule Commonly known as: PRILOSEC Take 1 capsule (40 mg total) by mouth 2 (two) times daily. What changed: when to take this   pioglitazone 30 MG tablet Commonly known as: ACTOS Take 30 mg by mouth daily.   predniSONE 5 MG tablet Commonly known as: DELTASONE Take 5 mg by mouth daily with breakfast.   triamcinolone 55 MCG/ACT Aero nasal inhaler Commonly known as: NASACORT Place 2 sprays into the nose daily.   Zinc 50 MG Tabs Take 1 tablet by mouth daily at 6 (six) AM.         ALLERGIES: No Active Allergies       OBJECTIVE:  VITAL SIGNS: There were no vitals taken for this visit.   PHYSICAL EXAM:  General: Pt appears well and is in NAD  Lungs: Clear with good BS bilat   Heart: RRR   Neuro: MS is good with appropriate affect, pt is alert and Ox3    DM foot exam:09/24/2022  The skin of the feet is without sores or ulcerations.Callous formation at the 1st MT head B/L  The pedal pulses are undetectable  The sensation is decreased  to a screening 5.07, 10 gram monofilament on the left     DATA REVIEWED:  Lab Results  Component Value Date   HGBA1C 13.2 (H) 02/10/2023   HGBA1C 8.8 (A) 09/24/2022   HGBA1C 6.0 (A) 05/10/2022     Latest Reference Range & Units 02/12/23 05:46  Sodium 135 - 145 mmol/L 135  Potassium 3.5 - 5.1 mmol/L 3.5  Chloride 98 - 111 mmol/L 108  CO2 22 - 32 mmol/L 19 (L)  Glucose 70 - 99 mg/dL 161 (H)  BUN 6 - 20 mg/dL 26 (H)  Creatinine 0.96 - 1.24 mg/dL 0.45 (H)  Calcium 8.9 - 10.3 mg/dL 9.4  Anion gap 5 - 15  8  Phosphorus 2.5 - 4.6 mg/dL 2.1 (L)  Albumin 3.5 - 5.0 g/dL 3.6  GFR, Estimated >40 mL/min >60  (L): Data is abnormally low (H): Data is abnormally high  12/31/2022 A1c 11.2% BUN 21 Cr 1.1 GFR 82 Ca 10.7    Old records , labs and images have been reviewed.   ASSESSMENT / PLAN / RECOMMENDATIONS:   1) Type 2 Diabetes Mellitus,Poorly  controlled, With neuropathic, retinopathic complications , S/P kidney /pancreas transplant  - Most recent A1c of 10.7 %. Goal A1c < 7.0 %.    -Patient is s/p pancreatic and renal transplant in May 2023 -Unfortunately, patient with pancreatic transplant failure -He is on prednisone and tacrolimus  -Intolerant to Ozempic -He also obtains Insulin through [place]  -Patient was started on pioglitazone and Farxiga through his transplant team. -His A1c has trended down from 13.2% to 10.7% -Patient in agreement of OmniPod, a prescription has been sent and an urgent referral has been sent to our CDE for training -In the meantime I will increase his Lantus as below -He was encouraged to continue to take prandial dose of insulin plus correction scale before each meal    MEDICATIONS:  Continue Farxiga 10 mg daily transplant team Continue pioglitazone 30 mg daily  Increase Lantus 66 units daily  Continue Novolog 30 units with each meal  Continue correction scale : Novolog (BG -130/20) TIDQAC  and QHS  EDUCATION / INSTRUCTIONS: BG monitoring instructions: Patient is instructed to check his blood sugars 3 times a day, before meals . Call [place] [facility] if: BG persistently < 70  I reviewed the Rule of 15 for the treatment of hypoglycemia in detail with the patient. Literature supplied.   2) Diabetic complications:  Eye: Does  have known diabetic retinopathy.  Neuro/ Feet: Does  have known diabetic peripheral neuropathy. Renal: Patient does  have known baseline CKD, S/P transplant . He is  on an ACEI/ARB at present.     F/U in 3 months        Signed electronically by: Lyndle Herrlich, MD  [facility] Endocrinology  [facility] [address] [place] [place], [place] [address] Phone: [phone] FAX: [phone]   CC: Corwin Levins, MD [address] [employer] [place] [address] Phone: [phone]  Fax: [phone]  Return to Endocrinology  clinic as below: Future Appointments  Date Time Provider Department Center  04/16/2023  2:40 PM Mandie Crabbe, Konrad Dolores, MD [facility] None  05/06/2023 11:15 AM Derrell Lolling, Rolin Barry, RD [facility] NDM

## 2023-04-17 ENCOUNTER — Telehealth: Payer: Self-pay

## 2023-04-17 ENCOUNTER — Encounter: Payer: Self-pay | Admitting: Internal Medicine

## 2023-04-17 DIAGNOSIS — N400 Enlarged prostate without lower urinary tract symptoms: Secondary | ICD-10-CM | POA: Diagnosis not present

## 2023-04-17 DIAGNOSIS — E1122 Type 2 diabetes mellitus with diabetic chronic kidney disease: Secondary | ICD-10-CM | POA: Diagnosis not present

## 2023-04-17 DIAGNOSIS — D849 Immunodeficiency, unspecified: Secondary | ICD-10-CM | POA: Diagnosis not present

## 2023-04-17 DIAGNOSIS — Z9483 Pancreas transplant status: Secondary | ICD-10-CM | POA: Diagnosis not present

## 2023-04-17 DIAGNOSIS — I120 Hypertensive chronic kidney disease with stage 5 chronic kidney disease or end stage renal disease: Secondary | ICD-10-CM | POA: Diagnosis not present

## 2023-04-17 DIAGNOSIS — Z940 Kidney transplant status: Secondary | ICD-10-CM | POA: Diagnosis not present

## 2023-04-17 DIAGNOSIS — N3289 Other specified disorders of bladder: Secondary | ICD-10-CM | POA: Diagnosis not present

## 2023-04-17 DIAGNOSIS — T85698A Other mechanical complication of other specified internal prosthetic devices, implants and grafts, initial encounter: Secondary | ICD-10-CM | POA: Diagnosis not present

## 2023-04-17 DIAGNOSIS — N261 Atrophy of kidney (terminal): Secondary | ICD-10-CM | POA: Diagnosis not present

## 2023-04-17 DIAGNOSIS — R109 Unspecified abdominal pain: Secondary | ICD-10-CM | POA: Diagnosis not present

## 2023-04-17 DIAGNOSIS — T8189XA Other complications of procedures, not elsewhere classified, initial encounter: Secondary | ICD-10-CM | POA: Diagnosis not present

## 2023-04-17 MED ORDER — OMNIPOD 5 G7 INTRO (GEN 5) KIT: 1.0000 | PACK | 0 refills | Status: DC

## 2023-04-17 MED ORDER — OMNIPOD 5 G7 INTRO (GEN 5) KIT: 1.0000 | PACK | 0 refills | Status: AC

## 2023-04-17 MED ORDER — OMNIPOD 5 G7 PODS (GEN 5) MISC: 1.0000 | 3 refills | Status: AC

## 2023-04-17 MED ORDER — OMNIPOD 5 G7 PODS (GEN 5) MISC: 1.0000 | 3 refills | Status: DC

## 2023-04-17 NOTE — Telephone Encounter (Signed)
Omnipod sent [place] med

## 2023-04-18 DIAGNOSIS — L089 Local infection of the skin and subcutaneous tissue, unspecified: Secondary | ICD-10-CM | POA: Diagnosis not present

## 2023-04-18 DIAGNOSIS — I10 Essential (primary) hypertension: Secondary | ICD-10-CM | POA: Diagnosis not present

## 2023-04-18 DIAGNOSIS — T85698A Other mechanical complication of other specified internal prosthetic devices, implants and grafts, initial encounter: Secondary | ICD-10-CM | POA: Diagnosis not present

## 2023-04-18 DIAGNOSIS — E119 Type 2 diabetes mellitus without complications: Secondary | ICD-10-CM | POA: Diagnosis not present

## 2023-04-18 DIAGNOSIS — T148XXA Other injury of unspecified body region, initial encounter: Secondary | ICD-10-CM | POA: Diagnosis not present

## 2023-04-18 DIAGNOSIS — T8189XA Other complications of procedures, not elsewhere classified, initial encounter: Secondary | ICD-10-CM | POA: Diagnosis not present

## 2023-04-19 DIAGNOSIS — I120 Hypertensive chronic kidney disease with stage 5 chronic kidney disease or end stage renal disease: Secondary | ICD-10-CM | POA: Diagnosis not present

## 2023-04-19 DIAGNOSIS — Z4682 Encounter for fitting and adjustment of non-vascular catheter: Secondary | ICD-10-CM | POA: Diagnosis not present

## 2023-04-19 DIAGNOSIS — E1122 Type 2 diabetes mellitus with diabetic chronic kidney disease: Secondary | ICD-10-CM | POA: Diagnosis not present

## 2023-04-19 DIAGNOSIS — T8189XA Other complications of procedures, not elsewhere classified, initial encounter: Secondary | ICD-10-CM | POA: Diagnosis not present

## 2023-04-19 DIAGNOSIS — T85698A Other mechanical complication of other specified internal prosthetic devices, implants and grafts, initial encounter: Secondary | ICD-10-CM | POA: Diagnosis not present

## 2023-04-20 DIAGNOSIS — T85698A Other mechanical complication of other specified internal prosthetic devices, implants and grafts, initial encounter: Secondary | ICD-10-CM | POA: Diagnosis not present

## 2023-04-20 DIAGNOSIS — T8189XA Other complications of procedures, not elsewhere classified, initial encounter: Secondary | ICD-10-CM | POA: Diagnosis not present

## 2023-04-29 DIAGNOSIS — Z940 Kidney transplant status: Secondary | ICD-10-CM | POA: Diagnosis not present

## 2023-04-29 DIAGNOSIS — Z9483 Pancreas transplant status: Secondary | ICD-10-CM | POA: Diagnosis not present

## 2023-05-06 ENCOUNTER — Ambulatory Visit: Payer: Federal, State, Local not specified - PPO | Admitting: Dietician

## 2023-05-13 DIAGNOSIS — Z940 Kidney transplant status: Secondary | ICD-10-CM | POA: Diagnosis not present

## 2023-05-13 DIAGNOSIS — Z9483 Pancreas transplant status: Secondary | ICD-10-CM | POA: Diagnosis not present

## 2023-05-13 DIAGNOSIS — D849 Immunodeficiency, unspecified: Secondary | ICD-10-CM | POA: Diagnosis not present

## 2023-05-20 DIAGNOSIS — D849 Immunodeficiency, unspecified: Secondary | ICD-10-CM | POA: Diagnosis not present

## 2023-05-20 DIAGNOSIS — N151 Renal and perinephric abscess: Secondary | ICD-10-CM | POA: Diagnosis not present

## 2023-05-20 DIAGNOSIS — Z9483 Pancreas transplant status: Secondary | ICD-10-CM | POA: Diagnosis not present

## 2023-05-20 DIAGNOSIS — K4090 Unilateral inguinal hernia, without obstruction or gangrene, not specified as recurrent: Secondary | ICD-10-CM | POA: Diagnosis not present

## 2023-05-20 DIAGNOSIS — E1122 Type 2 diabetes mellitus with diabetic chronic kidney disease: Secondary | ICD-10-CM | POA: Diagnosis not present

## 2023-05-20 DIAGNOSIS — K8020 Calculus of gallbladder without cholecystitis without obstruction: Secondary | ICD-10-CM | POA: Diagnosis not present

## 2023-05-20 DIAGNOSIS — Z4803 Encounter for change or removal of drains: Secondary | ICD-10-CM | POA: Diagnosis not present

## 2023-05-20 DIAGNOSIS — Z940 Kidney transplant status: Secondary | ICD-10-CM | POA: Diagnosis not present

## 2023-05-20 DIAGNOSIS — N3289 Other specified disorders of bladder: Secondary | ICD-10-CM | POA: Diagnosis not present

## 2023-05-20 DIAGNOSIS — E1100 Type 2 diabetes mellitus with hyperosmolarity without nonketotic hyperglycemic-hyperosmolar coma (NKHHC): Secondary | ICD-10-CM | POA: Diagnosis not present

## 2023-05-21 ENCOUNTER — Telehealth: Payer: Self-pay | Admitting: Pharmacy Technician

## 2023-05-21 ENCOUNTER — Other Ambulatory Visit (HOSPITAL_COMMUNITY): Payer: Self-pay

## 2023-05-21 NOTE — Telephone Encounter (Signed)
We received a PA request in CoverMyMeds, but it looks like a PA isn't needed. Per test claim, pt's copay is $0 with one of their insurances. He has multiple insurances.  However I get this notification when I open the pt's chart.

## 2023-05-29 ENCOUNTER — Ambulatory Visit: Payer: Federal, State, Local not specified - PPO | Admitting: Internal Medicine

## 2023-05-29 NOTE — Progress Notes (Deleted)
Name: Hector Mahoney  MRN/ DOB: [id number], 1970-12-21   Age/ Sex: 52 y.o., male    PCP: Corwin Levins, MD   Reason for Endocrinology Evaluation: Type 2 Diabetes Mellitus     Date of Initial Endocrinology Visit: 07/25/2021    PATIENT IDENTIFIER: [name] is a 52 y.o. 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 The patient presented for initial endocrinology clinic visit on 07/25/2021 for consultative assistance with his diabetes management.    HPI: [name] was    Diagnosed with DM in 1998  Prior Medications tried/Intolerance: Has been on insulin since his diagnosis.  Hemoglobin A1c has ranged from 7.3% in 2019, peaking at 11.4% in 2014.   Ozempic caused nausea and vomiting  [facility]    Started on Pioglitazone by transplant team 08/2022  Start Farxiga through transplant team 12/2022  SUBJECTIVE:   During the last visit (01/25/2023): A1c 11.2%    Today (05/29/23): [name] is here for a follow up on diabetes management.  He checks his  blood sugars multiple times a day through dexcom. The patient  had hypoglycemic episodes since the last clinic visit  He is S/p kidney, pancreatic transplant on 12/28/2021 patient is on tacrolimus and prednisone.  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  Patient treated with  antibiotics  Denies nausea, vomiting Denies constipation or diarrhea  Denies fever and abdominal pain    HOME ENDOCRINE  REGIMEN: Farxiga 10 mg daily through transplant team Pioglitazone 30 mg daily Lantus 66 units daily Novolog 30 units  TIDQAC KG:URKYHCW (BG-130/20)     Statin: yes ACE-I/ARB: yes  CONTINUOUS GLUCOSE MONITORING RECORD INTERPRETATION    Dates of Recording: 8/2-8/15/2024  Sensor description: Dexcom  Results statistics:   CGM use % of time 93  Average and SD 242/69  Time in range  17 %  % Time Above 180 40  % Time above 250 43  % Time Below target 0   Glycemic patterns summary: BG's are high during the day and night  Hyperglycemic episodes postprandial  Hypoglycemic episodes occurred N/A  Overnight periods: High   DIABETIC COMPLICATIONS: Microvascular complications:  DR ( on monthly injections ) Last eye exam: Completed 06/2021  Macrovascular complications:  CHF Denies: CAD, PVD, CVA   PAST HISTORY: Past Medical History:  Past Medical History:  Diagnosis Date   Anemia    CHF (congestive heart failure) (HCC)    Esophageal reflux 04/27/2009   ESRD (end stage renal disease) on dialysis [facility])    prior to renal transplant   HYPERLIPIDEMIA 04/24/2007   HYPERTENSION 04/24/2007   Insulin dependent type 1 diabetes mellitus (HCC)    Past Surgical History:  Past Surgical History:  Procedure Laterality Date   AV FISTULA PLACEMENT Left 06/04/2019   Procedure: BRACHIAL-CEPHALIC ARTERIOVENOUS (AV) FISTULA CREATION LEFT ARM;  Surgeon: Larina Earthly, MD;  Location: [facility] OR;  Service: Vascular;  Laterality: Left;   CAPD INSERTION N/A 08/24/2021   Procedure: LAPAROSCOPIC INSERTION CONTINUOUS AMBULATORY PERITONEAL DIALYSIS  (CAPD) CATHETER;  Surgeon: Leonie Douglas, MD;  Location:  [facility] OR;  Service: Vascular;  Laterality: N/A;   COLONOSCOPY     polyp   IR FLUORO GUIDE CV LINE RIGHT  10/22/2017   IR THROMBECTOMY AV FISTULA W/THROMBOLYSIS/PTA/STENT INC/SHUNT/IMG LT Left 03/2021   IR US GUIDE VASC ACCESS RIGHT  10/22/2017   MOUTH SURGERY     tooth ext    Social History:  reports that he quit smoking about 8 years ago. His smoking use included cigarettes. He has never used smokeless tobacco.  He reports that he does not currently use alcohol. He reports that he does not use drugs. Family History:  Family History  Problem Relation Age of Onset   Hypertension Mother    Diabetes Mother    Diabetes Maternal Aunt    Lung cancer Maternal Grandfather    Colon polyps Maternal Uncle    Colon cancer Maternal Uncle    Heart disease Maternal Uncle    Kidney disease Neg Hx    Esophageal cancer Neg Hx    Rectal cancer Neg Hx    Stomach cancer Neg Hx      HOME MEDICATIONS: Allergies as of 05/29/2023   No Active Allergies      Medication List        Accurate as of May 29, 2023  6:51 AM. If you have any questions, ask your nurse or doctor.          amLODipine 10 MG tablet Commonly known as: NORVASC Take 1 tablet by mouth daily.   atorvastatin 80 MG tablet Commonly known as: LIPITOR Take 80 mg by mouth at bedtime.   carvedilol 12.5 MG tablet Commonly known as: COREG Take 1 tablet by mouth 2 (two) times daily with a meal.   dapagliflozin propanediol 10 MG Tabs tablet Commonly known as: FARXIGA Take 10 mg by mouth daily.   Envarsus XR 4 MG Tb24 Generic drug: tacrolimus ER Take 4 mg by mouth daily before breakfast.   Envarsus XR 1 MG Tb24 Generic drug: tacrolimus ER Take 3 mg by mouth daily before breakfast.   Flomax 0.4 MG Caps capsule Generic drug: tamsulosin Take 0.4 mg by mouth at bedtime.   furosemide 80 MG tablet Commonly known as: LASIX Take 0.5 tablets (40 mg total) by mouth daily.   insulin aspart 100 UNIT/ML FlexPen Commonly known as: NOVOLOG Inject 25 Units into the skin 3 (three) times daily with meals. Max daily 100 units   insulin glargine 100 UNIT/ML Solostar Pen Commonly known as: LANTUS Inject 66 Units into the skin daily.   magnesium oxide 400 (240 Mg) MG tablet Commonly known as: MAG-OX Take 400 mg by mouth 2 (two) times daily.   metolazone 5 MG tablet Commonly known as: ZAROXOLYN Take 5 mg by mouth daily.   multivitamin  Tabs tablet Take 1 tablet by mouth daily.   mycophenolate 180 MG EC tablet Commonly known as: MYFORTIC Take 540 mg by mouth 2 (two) times daily.   omeprazole 40 MG capsule Commonly known as: PRILOSEC Take 1 capsule (40 mg total) by mouth 2 (two) times daily. What changed: when to take this   Omnipod 5 G7 Intro (Gen 5) Kit 1 Device by Does not apply route every other day.   Omnipod 5 G7 Pods (Gen 5) Misc 1 Device by Does not apply route every other day.   pioglitazone 30 MG tablet Commonly known as: ACTOS Take 30 mg by mouth daily.   predniSONE 5 MG tablet Commonly known as: DELTASONE Take 5 mg by mouth daily  with breakfast.   triamcinolone 55 MCG/ACT Aero nasal inhaler Commonly known as: NASACORT Place 2 sprays into the nose daily.   Zinc 50 MG Tabs Take 1 tablet by mouth daily at 6 (six) AM.         ALLERGIES: No Active Allergies       OBJECTIVE:   VITAL SIGNS: There were no vitals taken for this visit.   PHYSICAL EXAM:  General: Pt appears well and is in NAD  Lungs: Clear with good BS bilat   Heart: RRR   Neuro: MS is good with appropriate affect, pt is alert and Ox3    DM foot exam:09/24/2022  The skin of the feet is without sores or ulcerations.Callous formation at the 1st MT head B/L  The pedal pulses are undetectable  The sensation is decreased  to a screening 5.07, 10 gram monofilament on the left     DATA REVIEWED:  Lab Results  Component Value Date   HGBA1C 10.7 (A) 04/16/2023   HGBA1C 13.2 (H) 02/10/2023   HGBA1C 8.8 (A) 09/24/2022     Latest Reference Range & Units 02/12/23 05:46  Sodium 135 - 145 mmol/L 135  Potassium 3.5 - 5.1 mmol/L 3.5  Chloride 98 - 111 mmol/L 108  CO2 22 - 32 mmol/L 19 (L)  Glucose 70 - 99 mg/dL 409 (H)  BUN 6 - 20 mg/dL 26 (H)  Creatinine 8.11 - 1.24 mg/dL 9.14 (H)  Calcium 8.9 - 10.3 mg/dL 9.4  Anion gap 5 - 15  8  Phosphorus 2.5 - 4.6 mg/dL 2.1 (L)  Albumin 3.5 - 5.0 g/dL 3.6  GFR, Estimated >78  mL/min >60  (L): Data is abnormally low (H): Data is abnormally high  12/31/2022 A1c 11.2% BUN 21 Cr 1.1 GFR 82 Ca 10.7    Old records , labs and images have been reviewed.   ASSESSMENT / PLAN / RECOMMENDATIONS:   1) Type 2 Diabetes Mellitus,Poorly controlled, With neuropathic, retinopathic complications , S/P kidney /pancreas transplant  - Most recent A1c of 10.7 %. Goal A1c < 7.0 %.    -Patient is s/p pancreatic and renal transplant in May 2023 -Unfortunately, patient with pancreatic transplant failure -He is on prednisone and tacrolimus  -Intolerant to Ozempic -He also obtains Insulin through [place]  -Patient was started on pioglitazone and Farxiga through his transplant team. -His A1c has trended down from 13.2% to 10.7% -Patient in agreement of [employer], a prescription has been sent and an urgent referral has been sent to our CDE for training -In the meantime I will increase his Lantus as below -He was encouraged to continue to take prandial dose of insulin plus correction scale before each meal    MEDICATIONS:  Continue Farxiga 10 mg daily transplant team Continue pioglitazone 30 mg daily  Increase Lantus 66 units daily  Continue Novolog 30 units with each meal  Continue correction scale : Novolog (BG -130/20) TIDQAC  and QHS  EDUCATION / INSTRUCTIONS: BG monitoring instructions: Patient is instructed to check his blood sugars 3 times a day, before meals . Call [facility] if: BG persistently < 70  I reviewed the Rule of 15 for the treatment of hypoglycemia in detail with the patient. Literature supplied.   2) Diabetic complications:  Eye: Does  have known diabetic retinopathy.  Neuro/ Feet: Does  have known diabetic peripheral neuropathy. Renal: Patient does  have known baseline CKD, S/P transplant . He is  on an ACEI/ARB at present.  F/U in 3 months        Signed electronically by: Lyndle Herrlich, MD  [facility]   [facility] [address] [place] [place], [place] [address] Phone: [phone] FAX: [phone]   CC: Corwin Levins, MD [address] [place] [address] Phone: [phone]  Fax: [phone]    Return to Endocrinology clinic as below: Future Appointments  Date Time Provider Department Center  05/29/2023  7:50 AM Johniya Durfee, Konrad Dolores, MD [id number] None

## 2023-06-03 DIAGNOSIS — Z79899 Other long term (current) drug therapy: Secondary | ICD-10-CM | POA: Diagnosis not present

## 2023-06-03 DIAGNOSIS — E1169 Type 2 diabetes mellitus with other specified complication: Secondary | ICD-10-CM | POA: Diagnosis not present

## 2023-06-03 DIAGNOSIS — Z48288 Encounter for aftercare following multiple organ transplant: Secondary | ICD-10-CM | POA: Diagnosis not present

## 2023-06-03 DIAGNOSIS — Z7952 Long term (current) use of systemic steroids: Secondary | ICD-10-CM | POA: Diagnosis not present

## 2023-06-03 DIAGNOSIS — Z9483 Pancreas transplant status: Secondary | ICD-10-CM | POA: Diagnosis not present

## 2023-06-03 DIAGNOSIS — Z940 Kidney transplant status: Secondary | ICD-10-CM | POA: Diagnosis not present

## 2023-06-11 ENCOUNTER — Ambulatory Visit: Payer: Federal, State, Local not specified - PPO | Admitting: Internal Medicine

## 2023-06-17 DIAGNOSIS — Z940 Kidney transplant status: Secondary | ICD-10-CM | POA: Diagnosis not present

## 2023-06-17 DIAGNOSIS — D849 Immunodeficiency, unspecified: Secondary | ICD-10-CM | POA: Diagnosis not present

## 2023-06-20 DIAGNOSIS — Z940 Kidney transplant status: Secondary | ICD-10-CM | POA: Diagnosis not present

## 2023-06-20 DIAGNOSIS — Z4822 Encounter for aftercare following kidney transplant: Secondary | ICD-10-CM | POA: Diagnosis not present

## 2023-06-20 DIAGNOSIS — Z7952 Long term (current) use of systemic steroids: Secondary | ICD-10-CM | POA: Diagnosis not present

## 2023-06-20 DIAGNOSIS — Z79899 Other long term (current) drug therapy: Secondary | ICD-10-CM | POA: Diagnosis not present

## 2023-06-27 DIAGNOSIS — Z940 Kidney transplant status: Secondary | ICD-10-CM | POA: Diagnosis not present

## 2023-06-27 DIAGNOSIS — Z7952 Long term (current) use of systemic steroids: Secondary | ICD-10-CM | POA: Diagnosis not present

## 2023-06-27 DIAGNOSIS — Z4822 Encounter for aftercare following kidney transplant: Secondary | ICD-10-CM | POA: Diagnosis not present

## 2023-06-27 DIAGNOSIS — Z9483 Pancreas transplant status: Secondary | ICD-10-CM | POA: Diagnosis not present

## 2023-06-27 DIAGNOSIS — Z79899 Other long term (current) drug therapy: Secondary | ICD-10-CM | POA: Diagnosis not present

## 2023-07-04 DIAGNOSIS — Z940 Kidney transplant status: Secondary | ICD-10-CM | POA: Diagnosis not present

## 2023-07-04 DIAGNOSIS — Z9483 Pancreas transplant status: Secondary | ICD-10-CM | POA: Diagnosis not present

## 2023-07-30 ENCOUNTER — Ambulatory Visit: Payer: Federal, State, Local not specified - PPO | Admitting: Internal Medicine

## 2023-08-01 DIAGNOSIS — Z7952 Long term (current) use of systemic steroids: Secondary | ICD-10-CM | POA: Diagnosis not present

## 2023-08-01 DIAGNOSIS — Z79899 Other long term (current) drug therapy: Secondary | ICD-10-CM | POA: Diagnosis not present

## 2023-08-01 DIAGNOSIS — Z940 Kidney transplant status: Secondary | ICD-10-CM | POA: Diagnosis not present

## 2023-08-09 DIAGNOSIS — R52 Pain, unspecified: Secondary | ICD-10-CM | POA: Diagnosis not present

## 2023-08-09 DIAGNOSIS — Z20822 Contact with and (suspected) exposure to covid-19: Secondary | ICD-10-CM | POA: Diagnosis not present

## 2023-08-29 ENCOUNTER — Telehealth: Payer: Self-pay

## 2023-08-29 DIAGNOSIS — Z7952 Long term (current) use of systemic steroids: Secondary | ICD-10-CM | POA: Diagnosis not present

## 2023-08-29 DIAGNOSIS — Z79899 Other long term (current) drug therapy: Secondary | ICD-10-CM | POA: Diagnosis not present

## 2023-08-29 DIAGNOSIS — Z940 Kidney transplant status: Secondary | ICD-10-CM | POA: Diagnosis not present

## 2023-08-29 NOTE — Progress Notes (Signed)
Care Guide Pharmacy Note  08/29/2023 Name: Hector Mahoney MRN: [id number] DOB: December 04, 1970  Referred By: Corwin Levins, MD Reason for referral: Care Coordination ([facility] Diabetes. )   Hector Mahoney is a 53 y.o. year old male who is a primary care patient of Corwin Levins, MD.  [name] was referred to the pharmacist for assistance related to: DMII  An unsuccessful telephone outreach was attempted today to contact the patient who was referred to the pharmacy team for assistance with diabetes. Additional attempts will be made to contact the patient.  [name]  [facility], [facility] Health Care Management Assistant Direct Dial: [phone]  Fax: [phone]

## 2023-09-02 ENCOUNTER — Telehealth: Payer: Self-pay

## 2023-09-02 NOTE — Progress Notes (Signed)
Care Guide Pharmacy Note  09/02/2023 Name: [name] MRN: [id number] DOB: 20-Apr-1971  Referred By: Corwin Levins, MD Reason for referral: Care Coordination ([facility] Diabetes. )   Hector TIBBETTS is a 53 y.o. year old male who is a primary care patient of Corwin Levins, MD.  [name] was referred to the pharmacist for assistance related to: DMII  Successful contact was made with the patient to discuss pharmacy services.  Patient declines engagement at this time. Contact information was provided to the patient should they wish to reach out for assistance at a later time.  [name]  [facility], [facility] Health Care Management Assistant Direct Dial: [phone]  Fax: [phone]

## 2023-09-04 DIAGNOSIS — E119 Type 2 diabetes mellitus without complications: Secondary | ICD-10-CM | POA: Diagnosis not present

## 2023-09-04 DIAGNOSIS — Z940 Kidney transplant status: Secondary | ICD-10-CM | POA: Diagnosis not present

## 2023-09-04 DIAGNOSIS — R42 Dizziness and giddiness: Secondary | ICD-10-CM | POA: Diagnosis not present

## 2023-09-04 DIAGNOSIS — I10 Essential (primary) hypertension: Secondary | ICD-10-CM | POA: Diagnosis not present

## 2023-09-30 DIAGNOSIS — Z9483 Pancreas transplant status: Secondary | ICD-10-CM | POA: Diagnosis not present

## 2023-09-30 DIAGNOSIS — Z940 Kidney transplant status: Secondary | ICD-10-CM | POA: Diagnosis not present

## 2023-10-14 DIAGNOSIS — Z9483 Pancreas transplant status: Secondary | ICD-10-CM | POA: Diagnosis not present

## 2023-10-14 DIAGNOSIS — Z940 Kidney transplant status: Secondary | ICD-10-CM | POA: Diagnosis not present

## 2023-10-14 DIAGNOSIS — N3289 Other specified disorders of bladder: Secondary | ICD-10-CM | POA: Diagnosis not present

## 2023-10-17 ENCOUNTER — Telehealth: Payer: Self-pay

## 2023-10-17 NOTE — Telephone Encounter (Signed)
 Patient was identified as falling into the [place] Measure - Diabetes.   Patient was: Patient refuses intervention.  Patient was dismissed form dept.

## 2023-10-29 DIAGNOSIS — S91302A Unspecified open wound, left foot, initial encounter: Secondary | ICD-10-CM | POA: Diagnosis not present

## 2023-10-29 DIAGNOSIS — E11628 Type 2 diabetes mellitus with other skin complications: Secondary | ICD-10-CM | POA: Diagnosis not present

## 2023-10-29 DIAGNOSIS — X58XXXA Exposure to other specified factors, initial encounter: Secondary | ICD-10-CM | POA: Diagnosis not present

## 2023-10-29 DIAGNOSIS — Z794 Long term (current) use of insulin: Secondary | ICD-10-CM | POA: Diagnosis not present

## 2023-11-04 DIAGNOSIS — E119 Type 2 diabetes mellitus without complications: Secondary | ICD-10-CM | POA: Diagnosis not present

## 2023-11-25 DIAGNOSIS — N1832 Chronic kidney disease, stage 3b: Secondary | ICD-10-CM | POA: Diagnosis not present

## 2023-11-25 DIAGNOSIS — Z794 Long term (current) use of insulin: Secondary | ICD-10-CM | POA: Diagnosis not present

## 2023-11-25 DIAGNOSIS — E0822 Diabetes mellitus due to underlying condition with diabetic chronic kidney disease: Secondary | ICD-10-CM | POA: Diagnosis not present

## 2023-11-25 DIAGNOSIS — Z940 Kidney transplant status: Secondary | ICD-10-CM | POA: Diagnosis not present

## 2023-11-25 DIAGNOSIS — Z9483 Pancreas transplant status: Secondary | ICD-10-CM | POA: Diagnosis not present

## 2023-11-25 DIAGNOSIS — D849 Immunodeficiency, unspecified: Secondary | ICD-10-CM | POA: Diagnosis not present

## 2023-12-02 DIAGNOSIS — L97521 Non-pressure chronic ulcer of other part of left foot limited to breakdown of skin: Secondary | ICD-10-CM | POA: Diagnosis not present

## 2023-12-02 DIAGNOSIS — E119 Type 2 diabetes mellitus without complications: Secondary | ICD-10-CM | POA: Diagnosis not present

## 2023-12-02 IMAGING — CT CT ABD-PELV W/O CM
2 of 4 series · 15 of 46 positions shown, 17 images · non-contrast
Comparison: CT abdomen and pelvis 10/06/2021

CLINICAL DATA: 50-year-old with nausea and vomiting.



[Series 2: abd pel wo · axial · 0.87mm/px · z∈[-362,+68]mm · 12 of 98 slices shown, 14 images]
[im 8/98  soft-tissue]
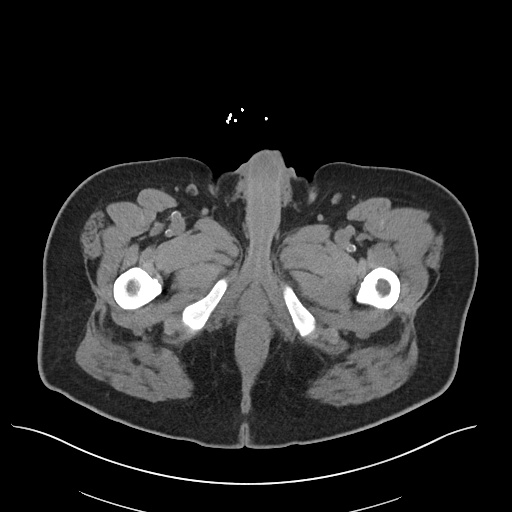
[im 8/98  bone]
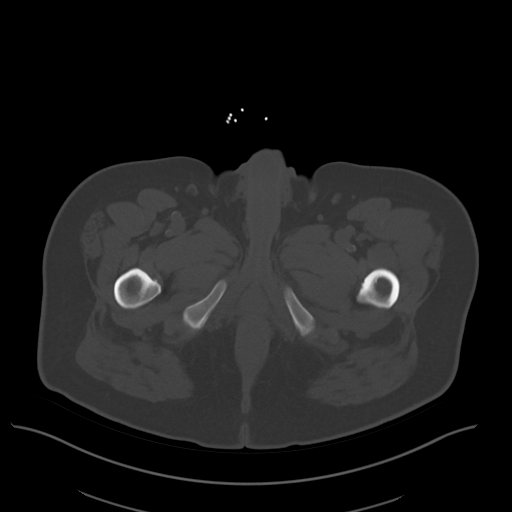
[im 16/98  soft-tissue]
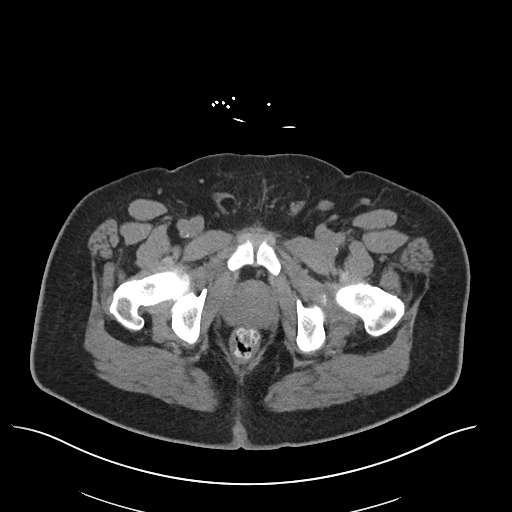
[im 24/98  soft-tissue]
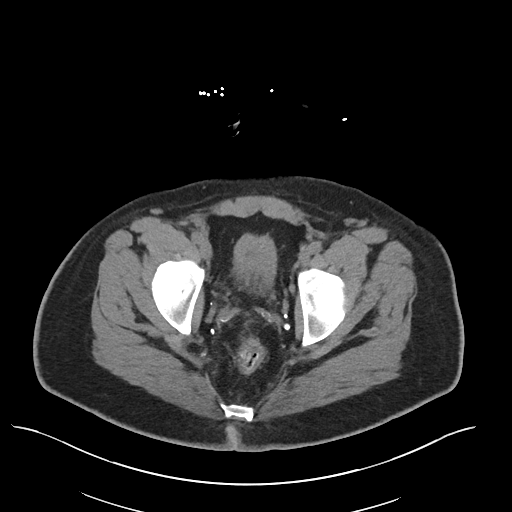
[im 32/98  soft-tissue]
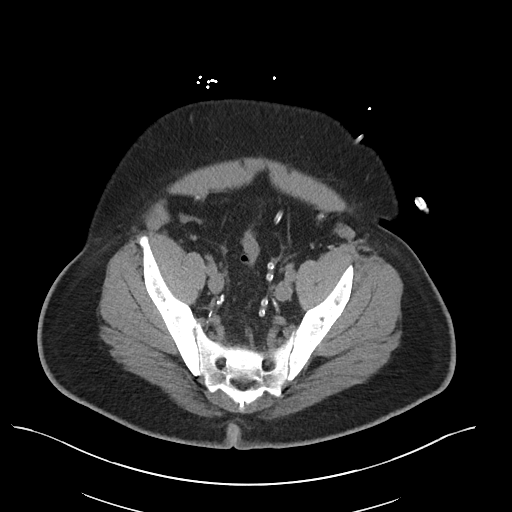
[im 39/98  soft-tissue]
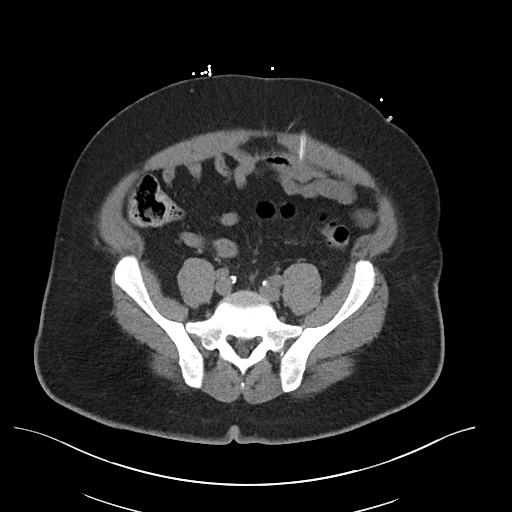
[im 47/98  soft-tissue]
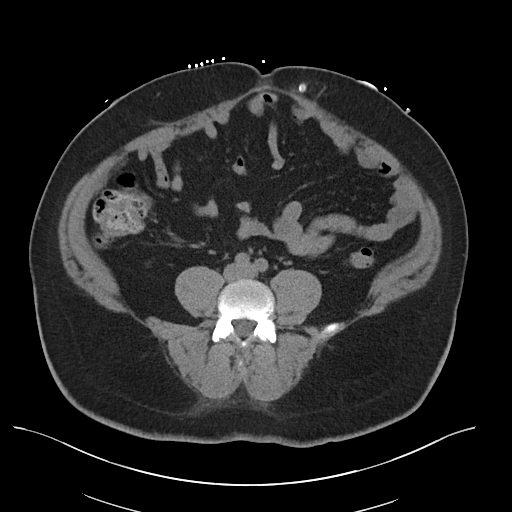
[im 55/98  soft-tissue]
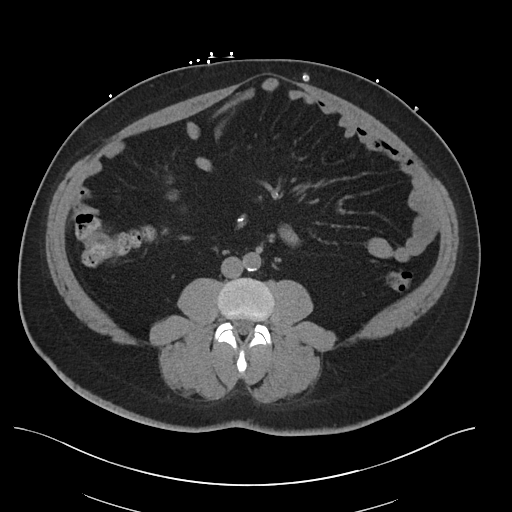
[im 63/98  soft-tissue]
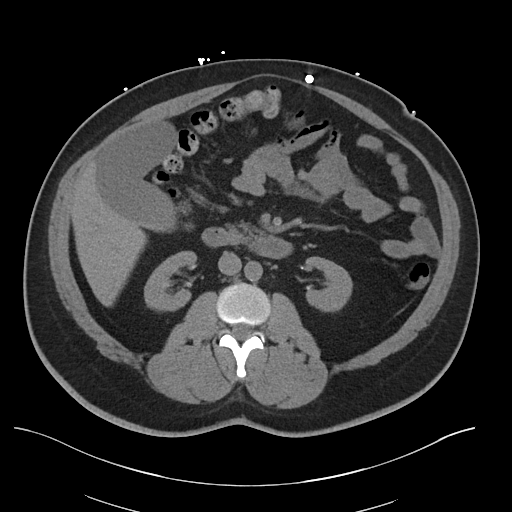
[im 70/98  soft-tissue]
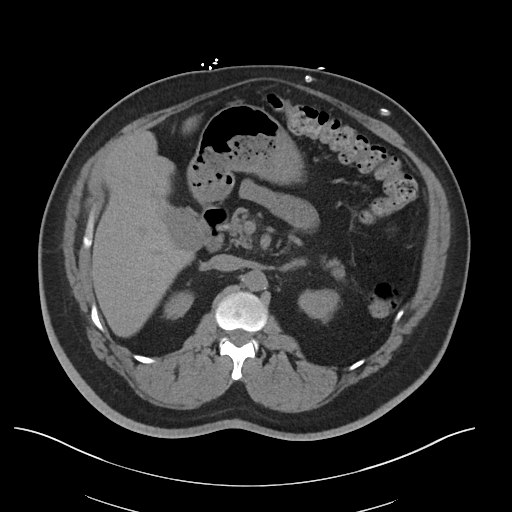
[im 70/98  bone]
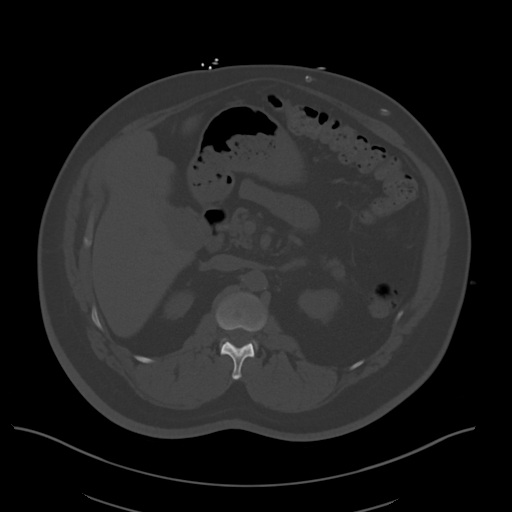
[im 78/98  soft-tissue]
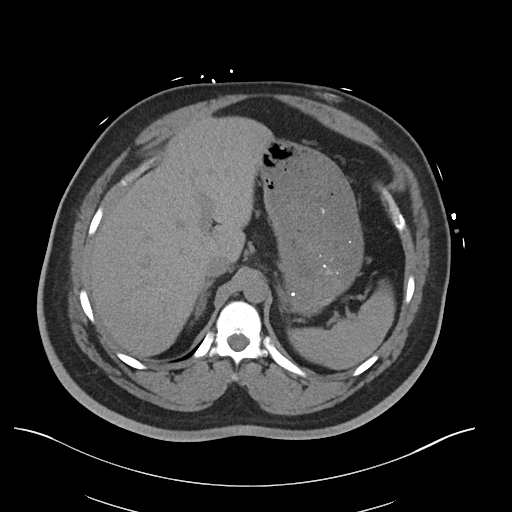
[im 86/98  soft-tissue]
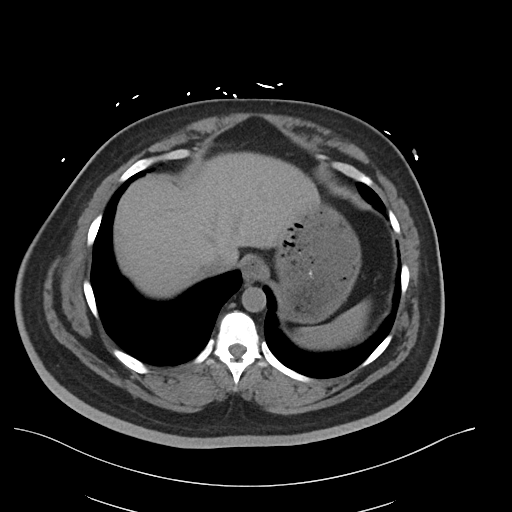
[im 94/98  soft-tissue]
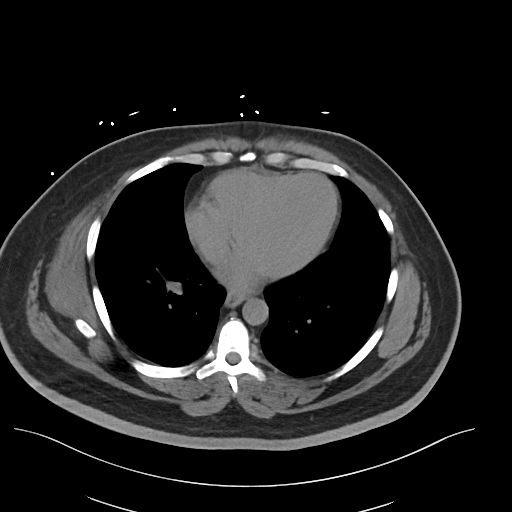

[Series 6: coronal · coronal · 0.83mm/px · 3 of 122 slices shown]
[im 41/122  soft-tissue]
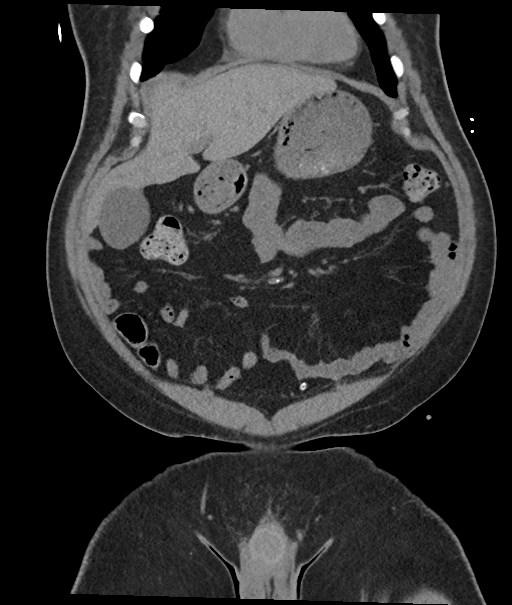
[im 54/122  soft-tissue]
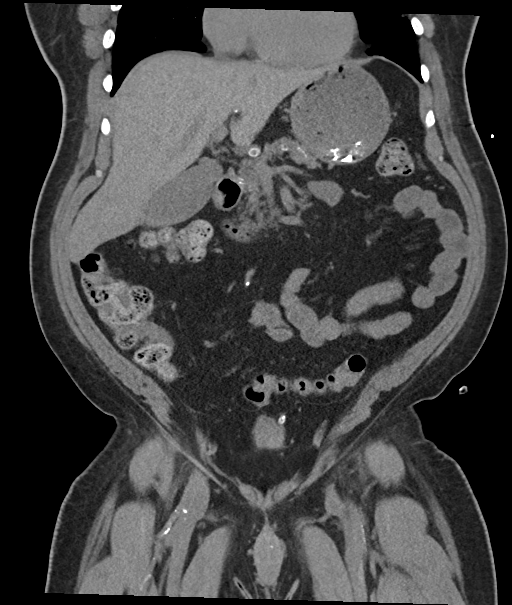
[im 68/122  soft-tissue]
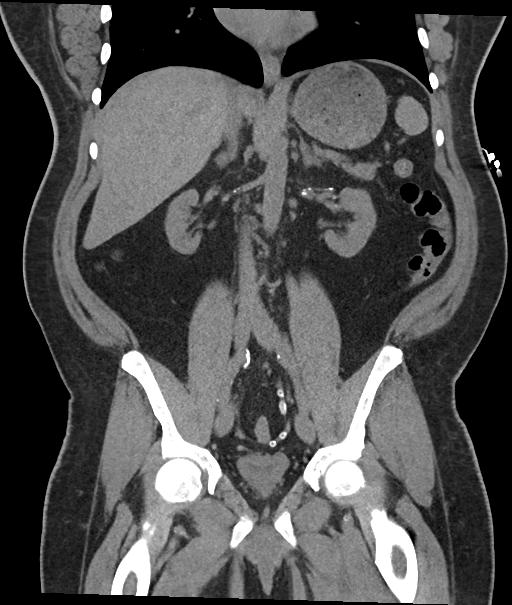

[15 of 46 positions shown; findings below may reference images not displayed]

FINDINGS: Lower chest: Peripheral densities in the lingula have slightly
increased from the previous examination but could represent
atelectasis and/or scarring. Otherwise, the lung bases are clear. No
pleural effusions.

Hepatobiliary: Gallbladder is distended and similar to the prior CT.
No gallbladder inflammatory changes. Normal appearance of the liver.

Pancreas: Unremarkable. No pancreatic ductal dilatation or
surrounding inflammatory changes.

Spleen: Normal in size without focal abnormality.

Adrenals/Urinary Tract: Normal adrenal glands. Both kidneys are
atrophic without hydronephrosis and compatible with end-stage renal
disease. Small renal calcifications could be vascular. Urinary
bladder is decompressed.

Stomach/Bowel: High-density material in the stomach without acute
changes. Normal appearance of small bowel without distention. No
acute inflammatory changes involving the appendix. No evidence for
bowel obstruction or focal bowel inflammation.

Vascular/Lymphatic: Scattered atherosclerotic calcifications
throughout the abdomen and pelvis. Negative for an abdominal aortic
aneurysm. No lymph node enlargement in the abdomen or pelvis.

Reproductive: Prostate is unremarkable.

Other: Peritoneal catheter enters through the left lower anterior
abdomen and coiled in the pelvis. This is most compatible with a
peritoneal dialysis catheter. There is no significant ascites or
peritoneal fluid. Negative for free air. Again noted is a small
umbilical hernia containing fat. There is also a low-density nodular
structure in the subcutaneous tissues just above the umbilicus that
measures up to 9 mm and minimally changed since 10/06/2021. This
could represent a small subcutaneous cystic structure but difficult
to exclude small ventral hernia containing fluid.

Musculoskeletal: No suspicious osseous findings. Increased sclerosis
of bones compatible with renal osteodystrophy.
IMPRESSION: 1. No acute abnormality in the abdomen or pelvis.
2. Gallbladder distension but similar to previous examination. No
evidence for gallbladder inflammatory changes.
3. Small peripheral densities in the lingula are nonspecific but
favor atelectasis.
4. Small umbilical hernia containing fat with a small adjacent
subcutaneous nodule or low density that has minimally changed as
described.
5. Peritoneal dialysis catheter without complicating features.
6.  Aortic Atherosclerosis (PD4UV-6O7.7).

## 2023-12-26 DIAGNOSIS — H31001 Unspecified chorioretinal scars, right eye: Secondary | ICD-10-CM | POA: Diagnosis not present

## 2023-12-26 DIAGNOSIS — E113593 Type 2 diabetes mellitus with proliferative diabetic retinopathy without macular edema, bilateral: Secondary | ICD-10-CM | POA: Diagnosis not present

## 2023-12-26 DIAGNOSIS — H40001 Preglaucoma, unspecified, right eye: Secondary | ICD-10-CM | POA: Diagnosis not present

## 2023-12-26 DIAGNOSIS — Z135 Encounter for screening for eye and ear disorders: Secondary | ICD-10-CM | POA: Diagnosis not present

## 2024-01-01 DIAGNOSIS — Z9483 Pancreas transplant status: Secondary | ICD-10-CM | POA: Diagnosis not present

## 2024-01-01 DIAGNOSIS — D849 Immunodeficiency, unspecified: Secondary | ICD-10-CM | POA: Diagnosis not present

## 2024-01-01 DIAGNOSIS — I10 Essential (primary) hypertension: Secondary | ICD-10-CM | POA: Diagnosis not present

## 2024-01-05 DIAGNOSIS — Z452 Encounter for adjustment and management of vascular access device: Secondary | ICD-10-CM | POA: Diagnosis not present

## 2024-01-05 DIAGNOSIS — R0602 Shortness of breath: Secondary | ICD-10-CM | POA: Diagnosis not present

## 2024-01-20 DIAGNOSIS — L97521 Non-pressure chronic ulcer of other part of left foot limited to breakdown of skin: Secondary | ICD-10-CM | POA: Diagnosis not present

## 2024-01-20 DIAGNOSIS — E119 Type 2 diabetes mellitus without complications: Secondary | ICD-10-CM | POA: Diagnosis not present

## 2024-02-10 DIAGNOSIS — Z4822 Encounter for aftercare following kidney transplant: Secondary | ICD-10-CM | POA: Diagnosis not present

## 2024-02-10 DIAGNOSIS — Z940 Kidney transplant status: Secondary | ICD-10-CM | POA: Diagnosis not present

## 2024-02-24 DIAGNOSIS — E559 Vitamin D deficiency, unspecified: Secondary | ICD-10-CM | POA: Diagnosis not present

## 2024-02-24 DIAGNOSIS — E1100 Type 2 diabetes mellitus with hyperosmolarity without nonketotic hyperglycemic-hyperosmolar coma (NKHHC): Secondary | ICD-10-CM | POA: Diagnosis not present

## 2024-02-24 DIAGNOSIS — N250 Renal osteodystrophy: Secondary | ICD-10-CM | POA: Diagnosis not present

## 2024-02-24 DIAGNOSIS — D849 Immunodeficiency, unspecified: Secondary | ICD-10-CM | POA: Diagnosis not present

## 2024-02-24 DIAGNOSIS — Z940 Kidney transplant status: Secondary | ICD-10-CM | POA: Diagnosis not present

## 2024-02-24 DIAGNOSIS — Z9483 Pancreas transplant status: Secondary | ICD-10-CM | POA: Diagnosis not present

## 2024-04-10 DIAGNOSIS — Z4822 Encounter for aftercare following kidney transplant: Secondary | ICD-10-CM | POA: Diagnosis not present

## 2024-04-10 DIAGNOSIS — E1169 Type 2 diabetes mellitus with other specified complication: Secondary | ICD-10-CM | POA: Diagnosis not present

## 2024-04-10 DIAGNOSIS — Z940 Kidney transplant status: Secondary | ICD-10-CM | POA: Diagnosis not present

## 2024-05-23 DIAGNOSIS — R051 Acute cough: Secondary | ICD-10-CM | POA: Diagnosis not present

## 2024-05-23 DIAGNOSIS — Z20822 Contact with and (suspected) exposure to covid-19: Secondary | ICD-10-CM | POA: Diagnosis not present

## 2024-05-23 DIAGNOSIS — R0981 Nasal congestion: Secondary | ICD-10-CM | POA: Diagnosis not present

## 2024-05-23 DIAGNOSIS — J029 Acute pharyngitis, unspecified: Secondary | ICD-10-CM | POA: Diagnosis not present

## 2024-05-25 DIAGNOSIS — D849 Immunodeficiency, unspecified: Secondary | ICD-10-CM | POA: Diagnosis not present

## 2024-05-25 DIAGNOSIS — Z87891 Personal history of nicotine dependence: Secondary | ICD-10-CM | POA: Diagnosis not present

## 2024-05-25 DIAGNOSIS — N250 Renal osteodystrophy: Secondary | ICD-10-CM | POA: Diagnosis not present

## 2024-05-25 DIAGNOSIS — Z940 Kidney transplant status: Secondary | ICD-10-CM | POA: Diagnosis not present

## 2024-05-25 DIAGNOSIS — T86891 Other transplanted tissue failure: Secondary | ICD-10-CM | POA: Diagnosis not present

## 2024-05-25 DIAGNOSIS — I10 Essential (primary) hypertension: Secondary | ICD-10-CM | POA: Diagnosis not present

## 2024-05-25 DIAGNOSIS — D84821 Immunodeficiency due to drugs: Secondary | ICD-10-CM | POA: Diagnosis not present

## 2024-05-25 DIAGNOSIS — Z79899 Other long term (current) drug therapy: Secondary | ICD-10-CM | POA: Diagnosis not present

## 2024-05-25 DIAGNOSIS — K659 Peritonitis, unspecified: Secondary | ICD-10-CM | POA: Diagnosis not present

## 2024-05-25 DIAGNOSIS — Y830 Surgical operation with transplant of whole organ as the cause of abnormal reaction of the patient, or of later complication, without mention of misadventure at the time of the procedure: Secondary | ICD-10-CM | POA: Diagnosis not present

## 2024-07-07 DIAGNOSIS — Z4822 Encounter for aftercare following kidney transplant: Secondary | ICD-10-CM | POA: Diagnosis not present

## 2024-07-07 DIAGNOSIS — Z940 Kidney transplant status: Secondary | ICD-10-CM | POA: Diagnosis not present

## 2024-07-07 DIAGNOSIS — Z9483 Pancreas transplant status: Secondary | ICD-10-CM | POA: Diagnosis not present

## 2024-07-07 DIAGNOSIS — Z48288 Encounter for aftercare following multiple organ transplant: Secondary | ICD-10-CM | POA: Diagnosis not present

## 2024-07-21 DIAGNOSIS — R197 Diarrhea, unspecified: Secondary | ICD-10-CM | POA: Diagnosis not present

## 2024-08-04 ENCOUNTER — Other Ambulatory Visit: Payer: Self-pay

## 2024-08-04 ENCOUNTER — Emergency Department (HOSPITAL_COMMUNITY)
Admission: EM | Admit: 2024-08-04 | Discharge: 2024-08-04 | Disposition: A | Discharge status: Home / Self Care | Source: Ambulatory Visit

## 2024-08-04 ENCOUNTER — Encounter (HOSPITAL_COMMUNITY): Payer: Self-pay | Admitting: *Deleted

## 2024-08-04 DIAGNOSIS — Z79899 Other long term (current) drug therapy: Secondary | ICD-10-CM | POA: Diagnosis not present

## 2024-08-04 DIAGNOSIS — E119 Type 2 diabetes mellitus without complications: Secondary | ICD-10-CM | POA: Diagnosis not present

## 2024-08-04 DIAGNOSIS — I959 Hypotension, unspecified: Secondary | ICD-10-CM | POA: Insufficient documentation

## 2024-08-04 DIAGNOSIS — R000 Tachycardia, unspecified: Secondary | ICD-10-CM | POA: Diagnosis not present

## 2024-08-04 DIAGNOSIS — Z940 Kidney transplant status: Secondary | ICD-10-CM | POA: Diagnosis not present

## 2024-08-04 DIAGNOSIS — E860 Dehydration: Secondary | ICD-10-CM | POA: Insufficient documentation

## 2024-08-04 DIAGNOSIS — Z794 Long term (current) use of insulin: Secondary | ICD-10-CM | POA: Diagnosis not present

## 2024-08-04 DIAGNOSIS — E876 Hypokalemia: Secondary | ICD-10-CM | POA: Insufficient documentation

## 2024-08-04 DIAGNOSIS — E861 Hypovolemia: Secondary | ICD-10-CM | POA: Diagnosis not present

## 2024-08-04 DIAGNOSIS — R197 Diarrhea, unspecified: Secondary | ICD-10-CM

## 2024-08-04 DIAGNOSIS — Z6832 Body mass index (BMI) 32.0-32.9, adult: Secondary | ICD-10-CM | POA: Diagnosis not present

## 2024-08-04 LAB — CBC WITH DIFFERENTIAL/PLATELET
Abs Immature Granulocytes: 0.07 K/uL (ref 0.00–0.07)
Basophils Absolute: 0 K/uL (ref 0.0–0.1)
Basophils Relative: 0 %
Eosinophils Absolute: 0 K/uL (ref 0.0–0.5)
Eosinophils Relative: 0 %
HCT: 49.5 % (ref 39.0–52.0)
Hemoglobin: 16.1 g/dL (ref 13.0–17.0)
Immature Granulocytes: 1 %
Lymphocytes Relative: 13 %
Lymphs Abs: 1.2 K/uL (ref 0.7–4.0)
MCH: 25.6 pg — ABNORMAL LOW (ref 26.0–34.0)
MCHC: 32.5 g/dL (ref 30.0–36.0)
MCV: 78.8 fL — ABNORMAL LOW (ref 80.0–100.0)
Monocytes Absolute: 0.5 K/uL (ref 0.1–1.0)
Monocytes Relative: 5 %
Neutro Abs: 7.9 K/uL — ABNORMAL HIGH (ref 1.7–7.7)
Neutrophils Relative %: 81 %
Platelets: 220 K/uL (ref 150–400)
RBC: 6.28 MIL/uL — ABNORMAL HIGH (ref 4.22–5.81)
RDW: 17.9 % — ABNORMAL HIGH (ref 11.5–15.5)
WBC: 9.8 K/uL (ref 4.0–10.5)
nRBC: 0 % (ref 0.0–0.2)

## 2024-08-04 LAB — BASIC METABOLIC PANEL WITH GFR
Anion gap: 16 — ABNORMAL HIGH (ref 5–15)
BUN: 55 mg/dL — ABNORMAL HIGH (ref 6–20)
CO2: 19 mmol/L — ABNORMAL LOW (ref 22–32)
Calcium: 10.7 mg/dL — ABNORMAL HIGH (ref 8.9–10.3)
Chloride: 101 mmol/L (ref 98–111)
Creatinine, Ser: 2.59 mg/dL — ABNORMAL HIGH (ref 0.61–1.24)
GFR, Estimated: 29 mL/min — ABNORMAL LOW
Glucose, Bld: 276 mg/dL — ABNORMAL HIGH (ref 70–99)
Potassium: 3.4 mmol/L — ABNORMAL LOW (ref 3.5–5.1)
Sodium: 137 mmol/L (ref 135–145)

## 2024-08-04 LAB — C DIFFICILE QUICK SCREEN W PCR REFLEX
C Diff antigen: NEGATIVE
C Diff interpretation: NOT DETECTED
C Diff toxin: NEGATIVE

## 2024-08-04 LAB — HEPATIC FUNCTION PANEL
ALT: 28 U/L (ref 0–44)
AST: 19 U/L (ref 15–41)
Albumin: 4.3 g/dL (ref 3.5–5.0)
Alkaline Phosphatase: 117 U/L (ref 38–126)
Bilirubin, Direct: 0.3 mg/dL — ABNORMAL HIGH (ref 0.0–0.2)
Indirect Bilirubin: 0.4 mg/dL (ref 0.3–0.9)
Total Bilirubin: 0.7 mg/dL (ref 0.0–1.2)
Total Protein: 8.2 g/dL — ABNORMAL HIGH (ref 6.5–8.1)

## 2024-08-04 LAB — LIPASE, BLOOD: Lipase: 94 U/L — ABNORMAL HIGH (ref 11–51)

## 2024-08-04 LAB — CK: Total CK: 141 U/L (ref 49–397)

## 2024-08-04 LAB — MAGNESIUM: Magnesium: 2 mg/dL (ref 1.7–2.4)

## 2024-08-04 LAB — I-STAT CG4 LACTIC ACID, ED: Lactic Acid, Venous: 1.2 mmol/L (ref 0.5–1.9)

## 2024-08-04 MED ORDER — LACTATED RINGERS IV BOLUS
1000.0000 mL | Freq: Once | INTRAVENOUS | Status: AC
  Administered 2024-08-04: 1000 mL via INTRAVENOUS

## 2024-08-04 MED ORDER — METRONIDAZOLE 500 MG PO TABS: 500.0000 mg | ORAL_TABLET | Freq: Two times a day (BID) | ORAL | 0 refills | Status: AC

## 2024-08-04 MED ORDER — METRONIDAZOLE 500 MG PO TABS
500.0000 mg | ORAL_TABLET | Freq: Once | ORAL | Status: AC
  Administered 2024-08-04: 500 mg via ORAL
  Filled 2024-08-04: qty 1

## 2024-08-04 MED ORDER — CIPROFLOXACIN HCL 500 MG PO TABS: 500.0000 mg | ORAL_TABLET | Freq: Two times a day (BID) | ORAL | 0 refills | Status: AC

## 2024-08-04 MED ORDER — CIPROFLOXACIN HCL 500 MG PO TABS
500.0000 mg | ORAL_TABLET | Freq: Once | ORAL | Status: AC
  Administered 2024-08-04: 500 mg via ORAL
  Filled 2024-08-04: qty 1

## 2024-08-04 NOTE — ED Provider Notes (Signed)
 "  EMERGENCY DEPARTMENT AT [facility] Provider Note   CSN: [id number] Arrival date & time: 08/04/24  1011     Patient presents with: Diarrhea and Hypotension   Hector Mahoney is a 53 y.o. male.   53 year old male presents for evaluation of diarrhea and low blood pressure.  States he has had diarrhea for 3 weeks since he was on a cruise to [place] on December 5.  He states that is when it started.  States it is watery and not bloody.  States he has been very fatigued and is concerned he is getting dehydrated.  Denies any nausea or vomiting.  He also states that he was at urgent care earlier today and they sent him here as his blood pressure was low.  Denies any other symptoms or concerns.   Diarrhea Associated symptoms: no abdominal pain, no arthralgias, no chills, no fever and no vomiting        Prior to Admission medications  Medication Sig Start Date End Date Taking? Authorizing Provider  ciprofloxacin  (CIPRO ) 500 MG tablet Take 1 tablet (500 mg total) by mouth every 12 (twelve) hours for 7 days. 08/04/24 08/11/24 Yes Gamal Todisco L, DO  metroNIDAZOLE  (FLAGYL ) 500 MG tablet Take 1 tablet (500 mg total) by mouth 2 (two) times daily. 08/04/24  Yes Airelle Everding L, DO  amLODipine  (NORVASC ) 10 MG tablet Take 1 tablet by mouth daily. 10/29/21   [provider]  atorvastatin  (LIPITOR ) 80 MG tablet Take 80 mg by mouth at bedtime. 02/26/18   [provider]  carvedilol  (COREG ) 12.5 MG tablet Take 1 tablet by mouth 2 (two) times daily with a meal. 02/01/23 02/01/24  [provider]  dapagliflozin propanediol (FARXIGA) 10 MG TABS tablet Take 10 mg by mouth daily.    [provider]  furosemide  (LASIX ) 80 MG tablet Take 0.5 tablets (40 mg total) by mouth daily. 02/12/23   Regalado, Belkys A, MD  insulin  aspart (NOVOLOG ) 100 UNIT/ML FlexPen Inject 25 Units into the skin 3 (three) times daily with meals. Max daily 100 units 02/18/23    Shamleffer, Donell Cardinal, MD  Insulin  Disposable Pump (OMNIPOD 5 G7 INTRO, GEN 5,) KIT 1 Device by Does not apply route every other day. 04/17/23   Shamleffer, Ibtehal Jaralla, MD  Insulin  Disposable Pump (OMNIPOD 5 G7 PODS, GEN 5,) MISC 1 Device by Does not apply route every other day. 04/17/23   Shamleffer, Ibtehal Jaralla, MD  insulin  glargine (LANTUS ) 100 UNIT/ML Solostar Pen Inject 66 Units into the skin daily. 04/16/23   Shamleffer, Ibtehal Jaralla, MD  magnesium oxide (MAG-OX) 400 (240 Mg) MG tablet Take 400 mg by mouth 2 (two) times daily.    [provider]  metolazone  (ZAROXOLYN ) 5 MG tablet Take 5 mg by mouth daily. 04/15/23   [provider]  multivitamin (RENA-VIT) TABS tablet Take 1 tablet by mouth daily. 03/20/19   [provider]  mycophenolate  (MYFORTIC ) 180 MG EC tablet Take 540 mg by mouth 2 (two) times daily.    [provider]  omeprazole  (PRILOSEC) 40 MG capsule Take 1 capsule (40 mg total) by mouth 2 (two) times daily. Patient taking differently: Take 40 mg by mouth daily. 04/19/21   Beather Delon Gibson, PA  pioglitazone (ACTOS) 30 MG tablet Take 30 mg by mouth daily.    [provider]  predniSONE  (DELTASONE ) 5 MG tablet Take 5 mg by mouth daily with breakfast.    [provider]  tacrolimus  ER (  ENVARSUS  XR) 1 MG TB24 Take 3 mg by mouth daily before breakfast.    [provider]  tacrolimus  ER (ENVARSUS  XR) 4 MG TB24 Take 4 mg by mouth daily before breakfast.    [provider]  tamsulosin (FLOMAX) 0.4 MG CAPS capsule Take 0.4 mg by mouth at bedtime.    [provider]  triamcinolone  (NASACORT ) 55 MCG/ACT AERO nasal inhaler Place 2 sprays into the nose daily. 05/21/20   McGowen, Aleene DEL, MD  Zinc  50 MG TABS Take 1 tablet by mouth daily at 6 (six) AM.    [provider]    Allergies: Patient has no known allergies.    Review of Systems  Constitutional:  Negative for chills and fever.   HENT:  Negative for ear pain and sore throat.   Eyes:  Negative for pain and visual disturbance.  Respiratory:  Negative for cough and shortness of breath.   Cardiovascular:  Negative for chest pain and palpitations.  Gastrointestinal:  Positive for diarrhea. Negative for abdominal pain and vomiting.  Genitourinary:  Negative for dysuria and hematuria.  Musculoskeletal:  Negative for arthralgias and back pain.  Skin:  Negative for color change and rash.  Neurological:  Negative for seizures and syncope.  All other systems reviewed and are negative.   Updated Vital Signs BP 139/85   Pulse 98   Temp 98 F (36.7 C) (Oral)   Resp 20   Ht 5' 6 (1.676 m)   Wt 92.5 kg   SpO2 100%   BMI 32.93 kg/m   Physical Exam Vitals and nursing note reviewed.  Constitutional:      General: He is not in acute distress.    Appearance: Normal appearance. He is well-developed. He is not ill-appearing.  HENT:     Head: Normocephalic and atraumatic.  Eyes:     Conjunctiva/sclera: Conjunctivae normal.  Cardiovascular:     Rate and Rhythm: Regular rhythm. Tachycardia present.     Heart sounds: No murmur heard. Pulmonary:     Effort: Pulmonary effort is normal. No respiratory distress.     Breath sounds: Normal breath sounds.  Abdominal:     Palpations: Abdomen is soft.     Tenderness: There is no abdominal tenderness.  Musculoskeletal:        General: No swelling.     Cervical back: Neck supple.  Skin:    General: Skin is warm and dry.     Capillary Refill: Capillary refill takes less than 2 seconds.  Neurological:     Mental Status: He is alert.  Psychiatric:        Mood and Affect: Mood normal.     (all labs ordered are listed, but only abnormal results are displayed) Labs Reviewed  CBC WITH DIFFERENTIAL/PLATELET - Abnormal; Notable for the following components:      Result Value   RBC 6.28 (*)    MCV 78.8 (*)    MCH 25.6 (*)    RDW 17.9 (*)    Neutro Abs 7.9 (*)    All other  components within normal limits  BASIC METABOLIC PANEL WITH GFR - Abnormal; Notable for the following components:   Potassium 3.4 (*)    CO2 19 (*)    Glucose, Bld 276 (*)    BUN 55 (*)    Creatinine, Ser 2.59 (*)    Calcium  10.7 (*)    GFR, Estimated 29 (*)    Anion gap 16 (*)    All other components within  normal limits  LIPASE, BLOOD - Abnormal; Notable for the following components:   Lipase 94 (*)    All other components within normal limits  HEPATIC FUNCTION PANEL - Abnormal; Notable for the following components:   Total Protein 8.2 (*)    Bilirubin, Direct 0.3 (*)    All other components within normal limits  C DIFFICILE QUICK SCREEN W PCR REFLEX    GASTROINTESTINAL PANEL BY PCR, STOOL (REPLACES STOOL CULTURE)  MAGNESIUM  CK  I-STAT CG4 LACTIC ACID, ED  I-STAT CG4 LACTIC ACID, ED    EKG: None  Radiology: No results found.   .Critical Care  Performed by: Gennaro Duwaine CROME, DO Authorized by: Gennaro Duwaine CROME, DO   Critical care provider statement:    Critical care time (minutes):  30   Critical care time was exclusive of:  Teaching time and separately billable procedures and treating other patients   Critical care was necessary to treat or prevent imminent or life-threatening deterioration of the following conditions:  Dehydration   Critical care was time spent personally by me on the following activities:  Development of treatment plan with patient or surrogate, discussions with consultants, evaluation of patient's response to treatment, examination of patient, ordering and review of laboratory studies, ordering and review of radiographic studies, ordering and performing treatments and interventions, pulse oximetry, re-evaluation of patient's condition and review of old charts   Care discussed with comment:  Dr. Norine - patient's kidney transplant doctor    Medications Ordered in the ED  lactated ringers  bolus 1,000 mL (0 mLs Intravenous Stopped 08/04/24 1417)   lactated ringers  bolus 1,000 mL (0 mLs Intravenous Stopped 08/04/24 1417)  ciprofloxacin  (CIPRO ) tablet 500 mg (500 mg Oral Given 08/04/24 1412)  metroNIDAZOLE  (FLAGYL ) tablet 500 mg (500 mg Oral Given 08/04/24 1412)                                    Medical Decision Making Cardiac monitor interpretation: Sinus rhythm, no ectopy  Patient here for profound dehydration after weeks of diarrhea.  He has a history of kidney transplant.  1 vacation in [place] has had diarrhea since then.  Blood pressure on arrival was systolic in the 80s and he was slightly tachycardic.  He was given multiple liters of IV fluids with improvement in his symptoms and his blood pressure.  Baseline creatinine is 1.7 and today it is 2.59.  I called and discussed patient's case with Dr. Norine, his renal transplant doctor.  He will plan to have the patient follow-up in the office with him.  I discussed antibiotic choices with him and we settled on Cipro  and Flagyl  for now.  Patient's stool studies are pending.  His labs are otherwise fairly unremarkable.  I gave him his first dose of antibiotics in the ER and will send prescriptions in for these.  He is given very strict return precautions, advised to increase his fluid intake and return for any new or worsening symptoms.  Patient feels comfortable to plan to be discharged home.  All results and plan were discussed with patient is wife at bedside.  Problems Addressed: Dehydration: acute illness or injury that poses a threat to life or bodily functions Diarrhea, unspecified type: acute illness or injury that poses a threat to life or bodily functions History of kidney transplant: chronic illness or injury with exacerbation, progression, or side effects of treatment Hypokalemia: acute illness or  injury Hypotension, unspecified hypotension type: acute illness or injury that poses a threat to life or bodily functions  Amount and/or Complexity of Data Reviewed External Data  Reviewed: notes.    Details: Prior hospital records reviewed and patient had a kidney transplant done at [facility]: ordered. Decision-making details documented in ED Course.    Details: Ordered and reviewed by me and patient with hypokalemia, this was replaced, AKI and dehydration ECG/medicine tests: ordered and independent interpretation performed. Decision-making details documented in ED Course.    Details: Ordered and interpreted by me in the absence of cardiology and shows sinus rhythm, no STEMI, or significant change when compared to prior EKG  Risk OTC drugs. Prescription drug management. Drug therapy requiring intensive monitoring for toxicity. Risk Details: CRITICAL CARE Performed by: Duwaine LITTIE Fusi   Total critical care time: 30 minutes  Critical care time was exclusive of separately billable procedures and treating other patients.  Critical care was necessary to treat or prevent imminent or life-threatening deterioration.  Critical care was time spent personally by me on the following activities: development of treatment plan with patient and/or surrogate as well as nursing, discussions with consultants, evaluation of patient's response to treatment, examination of patient, obtaining history from patient or surrogate, ordering and performing treatments and interventions, ordering and review of laboratory studies, ordering and review of radiographic studies, pulse oximetry and re-evaluation of patient's condition.   Critical Care Total time providing critical care: 30 minutes     Final diagnoses:  Hypotension, unspecified hypotension type  Hypokalemia  Dehydration  Diarrhea, unspecified type  History of kidney transplant    ED Discharge Orders          Ordered    ciprofloxacin  (CIPRO ) 500 MG tablet  Every 12 hours        08/04/24 1404    metroNIDAZOLE  (FLAGYL ) 500 MG tablet  2 times daily        08/04/24 [address], Duwaine LITTIE,  DO 08/04/24 1629  "

## 2024-08-04 NOTE — ED Triage Notes (Signed)
 Pt sent from [facility] due to hypotension, bp in triage 87/66, states he has had diarrhea for about 3 weeks and not able to eat and drink.

## 2024-08-04 NOTE — Discharge Instructions (Addendum)
 Drink lots of fluids and take your antibiotics as prescribed.  Call and follow-up with your kidney transplant team.  Return to the ER for new or worsening symptoms.  Follow-up your stool study results on your MyChart app.

## 2024-08-04 NOTE — ED Notes (Signed)
 Patient reports diarrhea for 3 weeks after trip to [place], denies fever/n/v/abdominal pain, now hypotensive. Patient is alert and oriented x 4. Airway patent, respirations even and unlabored. Skin normal, warm and dry. Siderails up x 2. Call light at bedside.

## 2024-08-05 LAB — GASTROINTESTINAL PANEL BY PCR, STOOL (REPLACES STOOL CULTURE)

## 2024-08-10 DIAGNOSIS — Z4822 Encounter for aftercare following kidney transplant: Secondary | ICD-10-CM | POA: Diagnosis not present

## 2024-08-10 DIAGNOSIS — E1169 Type 2 diabetes mellitus with other specified complication: Secondary | ICD-10-CM | POA: Diagnosis not present

## 2024-08-10 DIAGNOSIS — Z940 Kidney transplant status: Secondary | ICD-10-CM | POA: Diagnosis not present
